# Patient Record
Sex: Female | Born: 1955
Health system: Southern US, Community
[De-identification: ages and names within clinical notes are randomized; demographics above are authoritative.]

## PROBLEM LIST (undated history)

## (undated) DIAGNOSIS — K5790 Diverticulosis of intestine, part unspecified, without perforation or abscess without bleeding: Secondary | ICD-10-CM

## (undated) DIAGNOSIS — E039 Hypothyroidism, unspecified: Secondary | ICD-10-CM

## (undated) DIAGNOSIS — D649 Anemia, unspecified: Secondary | ICD-10-CM

## (undated) DIAGNOSIS — I5022 Chronic systolic (congestive) heart failure: Secondary | ICD-10-CM

## (undated) DIAGNOSIS — K52839 Microscopic colitis, unspecified: Secondary | ICD-10-CM

## (undated) DIAGNOSIS — E785 Hyperlipidemia, unspecified: Secondary | ICD-10-CM

## (undated) DIAGNOSIS — I471 Supraventricular tachycardia, unspecified: Secondary | ICD-10-CM

## (undated) DIAGNOSIS — N289 Disorder of kidney and ureter, unspecified: Secondary | ICD-10-CM

## (undated) DIAGNOSIS — I739 Peripheral vascular disease, unspecified: Secondary | ICD-10-CM

## (undated) DIAGNOSIS — K222 Esophageal obstruction: Secondary | ICD-10-CM

## (undated) DIAGNOSIS — I8390 Asymptomatic varicose veins of unspecified lower extremity: Secondary | ICD-10-CM

## (undated) DIAGNOSIS — I4819 Other persistent atrial fibrillation: Secondary | ICD-10-CM

## (undated) DIAGNOSIS — E119 Type 2 diabetes mellitus without complications: Secondary | ICD-10-CM

## (undated) DIAGNOSIS — T462X1A Poisoning by other antidysrhythmic drugs, accidental (unintentional), initial encounter: Secondary | ICD-10-CM

## (undated) DIAGNOSIS — W010XXA Fall on same level from slipping, tripping and stumbling without subsequent striking against object, initial encounter: Secondary | ICD-10-CM

## (undated) DIAGNOSIS — Z950 Presence of cardiac pacemaker: Secondary | ICD-10-CM

## (undated) DIAGNOSIS — Q249 Congenital malformation of heart, unspecified: Secondary | ICD-10-CM

## (undated) DIAGNOSIS — I2510 Atherosclerotic heart disease of native coronary artery without angina pectoris: Secondary | ICD-10-CM

## (undated) DIAGNOSIS — I428 Other cardiomyopathies: Secondary | ICD-10-CM

## (undated) DIAGNOSIS — I10 Essential (primary) hypertension: Secondary | ICD-10-CM

## (undated) DIAGNOSIS — D509 Iron deficiency anemia, unspecified: Secondary | ICD-10-CM

## (undated) DIAGNOSIS — K219 Gastro-esophageal reflux disease without esophagitis: Secondary | ICD-10-CM

## (undated) DIAGNOSIS — M419 Scoliosis, unspecified: Secondary | ICD-10-CM

## (undated) HISTORY — DX: Fall on same level from slipping, tripping and stumbling without subsequent striking against object, initial encounter: W01.0XXA

## (undated) HISTORY — DX: Peripheral vascular disease, unspecified: I73.9

## (undated) HISTORY — DX: Iron deficiency anemia, unspecified: D50.9

## (undated) HISTORY — DX: Type 2 diabetes mellitus without complications: E11.9

## (undated) HISTORY — DX: Hyperlipidemia, unspecified: E78.5

## (undated) HISTORY — DX: Microscopic colitis, unspecified: K52.839

## (undated) HISTORY — DX: Hypothyroidism, unspecified: E03.9

## (undated) HISTORY — PX: TONSILLECTOMY: SUR1361

## (undated) HISTORY — DX: Gastro-esophageal reflux disease without esophagitis: K21.9

## (undated) HISTORY — DX: Essential (primary) hypertension: I10

## (undated) HISTORY — DX: Presence of cardiac pacemaker: Z95.0

## (undated) HISTORY — PX: ASD REPAIR: SHX258

## (undated) HISTORY — DX: Disorder of kidney and ureter, unspecified: N28.9

## (undated) HISTORY — DX: Congenital malformation of heart, unspecified: Q24.9

## (undated) HISTORY — DX: Asymptomatic varicose veins of unspecified lower extremity: I83.90

## (undated) HISTORY — DX: Supraventricular tachycardia: I47.1

## (undated) HISTORY — DX: Diverticulosis of intestine, part unspecified, without perforation or abscess without bleeding: K57.90

## (undated) HISTORY — DX: Supraventricular tachycardia, unspecified: I47.10

## (undated) HISTORY — DX: Esophageal obstruction: K22.2

## (undated) HISTORY — DX: Scoliosis, unspecified: M41.9

---

## 2004-06-29 ENCOUNTER — Ambulatory Visit (HOSPITAL_COMMUNITY): Admission: RE | Admit: 2004-06-29 | Discharge: 2004-06-29 | Payer: Self-pay | Admitting: Family Medicine

## 2004-09-24 DIAGNOSIS — I739 Peripheral vascular disease, unspecified: Secondary | ICD-10-CM

## 2004-09-24 HISTORY — DX: Peripheral vascular disease, unspecified: I73.9

## 2005-04-04 ENCOUNTER — Ambulatory Visit (HOSPITAL_COMMUNITY): Admission: RE | Admit: 2005-04-04 | Discharge: 2005-04-04 | Payer: Self-pay | Admitting: *Deleted

## 2005-06-26 HISTORY — PX: OTHER SURGICAL HISTORY: SHX169

## 2005-08-28 ENCOUNTER — Ambulatory Visit (HOSPITAL_COMMUNITY): Admission: RE | Admit: 2005-08-28 | Discharge: 2005-08-28 | Payer: Self-pay | Admitting: Cardiovascular Disease

## 2005-08-31 ENCOUNTER — Ambulatory Visit (HOSPITAL_COMMUNITY): Admission: RE | Admit: 2005-08-31 | Discharge: 2005-08-31 | Payer: Self-pay | Admitting: Cardiovascular Disease

## 2005-10-16 ENCOUNTER — Inpatient Hospital Stay (HOSPITAL_COMMUNITY): Admission: RE | Admit: 2005-10-16 | Discharge: 2005-10-18 | Payer: Self-pay | Admitting: *Deleted

## 2006-09-24 ENCOUNTER — Ambulatory Visit (HOSPITAL_COMMUNITY): Admission: RE | Admit: 2006-09-24 | Discharge: 2006-09-24 | Payer: Self-pay | Admitting: Internal Medicine

## 2006-09-25 HISTORY — PX: COLONOSCOPY: SHX174

## 2006-10-01 ENCOUNTER — Ambulatory Visit: Payer: Self-pay | Admitting: Urgent Care

## 2006-10-15 ENCOUNTER — Ambulatory Visit (HOSPITAL_COMMUNITY): Admission: RE | Admit: 2006-10-15 | Discharge: 2006-10-15 | Payer: Self-pay | Admitting: Internal Medicine

## 2006-10-15 ENCOUNTER — Ambulatory Visit: Payer: Self-pay | Admitting: Internal Medicine

## 2006-12-06 ENCOUNTER — Ambulatory Visit: Payer: Self-pay | Admitting: Gastroenterology

## 2006-12-13 ENCOUNTER — Ambulatory Visit: Payer: Self-pay | Admitting: Gastroenterology

## 2007-05-20 ENCOUNTER — Ambulatory Visit: Payer: Self-pay | Admitting: Cardiology

## 2007-05-20 ENCOUNTER — Ambulatory Visit (HOSPITAL_COMMUNITY): Admission: RE | Admit: 2007-05-20 | Discharge: 2007-05-20 | Payer: Self-pay | Admitting: Cardiology

## 2007-06-12 ENCOUNTER — Ambulatory Visit: Payer: Self-pay | Admitting: Cardiology

## 2007-09-24 ENCOUNTER — Ambulatory Visit (HOSPITAL_COMMUNITY): Admission: RE | Admit: 2007-09-24 | Discharge: 2007-09-24 | Payer: Self-pay | Admitting: Internal Medicine

## 2007-10-16 ENCOUNTER — Ambulatory Visit (HOSPITAL_COMMUNITY): Admission: RE | Admit: 2007-10-16 | Discharge: 2007-10-16 | Payer: Self-pay | Admitting: Internal Medicine

## 2007-10-24 ENCOUNTER — Ambulatory Visit: Payer: Self-pay | Admitting: *Deleted

## 2007-11-05 ENCOUNTER — Ambulatory Visit (HOSPITAL_COMMUNITY): Admission: RE | Admit: 2007-11-05 | Discharge: 2007-11-05 | Payer: Self-pay | Admitting: Internal Medicine

## 2007-11-06 ENCOUNTER — Ambulatory Visit: Payer: Self-pay | Admitting: Cardiology

## 2007-11-07 ENCOUNTER — Encounter: Payer: Self-pay | Admitting: Cardiology

## 2007-11-07 ENCOUNTER — Ambulatory Visit: Payer: Self-pay | Admitting: Cardiovascular Disease

## 2007-11-07 ENCOUNTER — Ambulatory Visit (HOSPITAL_COMMUNITY): Admission: RE | Admit: 2007-11-07 | Discharge: 2007-11-07 | Payer: Self-pay | Admitting: Cardiology

## 2007-11-16 ENCOUNTER — Emergency Department (HOSPITAL_COMMUNITY): Admission: EM | Admit: 2007-11-16 | Discharge: 2007-11-16 | Payer: Self-pay | Admitting: Emergency Medicine

## 2007-11-25 ENCOUNTER — Ambulatory Visit: Payer: Self-pay | Admitting: Cardiology

## 2008-02-07 ENCOUNTER — Ambulatory Visit (HOSPITAL_COMMUNITY): Admission: RE | Admit: 2008-02-07 | Discharge: 2008-02-07 | Payer: Self-pay | Admitting: Internal Medicine

## 2008-03-03 ENCOUNTER — Encounter (HOSPITAL_COMMUNITY): Admission: RE | Admit: 2008-03-03 | Discharge: 2008-03-23 | Payer: Self-pay | Admitting: Internal Medicine

## 2008-04-29 ENCOUNTER — Ambulatory Visit (HOSPITAL_COMMUNITY): Admission: RE | Admit: 2008-04-29 | Discharge: 2008-04-29 | Payer: Self-pay | Admitting: Internal Medicine

## 2008-05-26 ENCOUNTER — Ambulatory Visit (HOSPITAL_COMMUNITY): Admission: RE | Admit: 2008-05-26 | Discharge: 2008-05-26 | Payer: Self-pay | Admitting: Internal Medicine

## 2008-05-26 HISTORY — PX: OTHER SURGICAL HISTORY: SHX169

## 2008-05-26 HISTORY — PX: ESOPHAGOGASTRODUODENOSCOPY: SHX1529

## 2008-06-02 ENCOUNTER — Ambulatory Visit (HOSPITAL_COMMUNITY): Admission: RE | Admit: 2008-06-02 | Discharge: 2008-06-02 | Payer: Self-pay | Admitting: Internal Medicine

## 2008-06-08 ENCOUNTER — Inpatient Hospital Stay (HOSPITAL_COMMUNITY): Admission: EM | Admit: 2008-06-08 | Discharge: 2008-06-11 | Payer: Self-pay | Admitting: Emergency Medicine

## 2008-06-08 ENCOUNTER — Ambulatory Visit: Payer: Self-pay | Admitting: Cardiology

## 2008-06-08 ENCOUNTER — Ambulatory Visit: Payer: Self-pay | Admitting: Gastroenterology

## 2008-06-09 ENCOUNTER — Encounter: Payer: Self-pay | Admitting: Cardiology

## 2008-06-09 ENCOUNTER — Ambulatory Visit: Payer: Self-pay | Admitting: Internal Medicine

## 2008-06-10 ENCOUNTER — Ambulatory Visit: Payer: Self-pay | Admitting: Gastroenterology

## 2008-06-11 ENCOUNTER — Ambulatory Visit: Payer: Self-pay | Admitting: Internal Medicine

## 2008-06-24 ENCOUNTER — Ambulatory Visit (HOSPITAL_COMMUNITY): Payer: Self-pay | Admitting: Oncology

## 2008-06-24 ENCOUNTER — Encounter (HOSPITAL_COMMUNITY): Admission: RE | Admit: 2008-06-24 | Discharge: 2008-07-24 | Payer: Self-pay | Admitting: Oncology

## 2008-06-25 ENCOUNTER — Encounter: Payer: Self-pay | Admitting: Physician Assistant

## 2008-06-25 ENCOUNTER — Ambulatory Visit: Payer: Self-pay | Admitting: Cardiology

## 2008-06-26 HISTORY — PX: HEMORRHOID SURGERY: SHX153

## 2008-06-30 ENCOUNTER — Ambulatory Visit (HOSPITAL_COMMUNITY): Admission: RE | Admit: 2008-06-30 | Discharge: 2008-07-01 | Payer: Self-pay | Admitting: Cardiology

## 2008-07-09 ENCOUNTER — Ambulatory Visit: Payer: Self-pay | Admitting: Cardiology

## 2008-07-10 ENCOUNTER — Encounter: Payer: Self-pay | Admitting: Gastroenterology

## 2008-07-10 LAB — CONVERTED CEMR LAB
Basophils Absolute: 0.1 10*3/uL (ref 0.0–0.1)
Basophils Relative: 1 % (ref 0–1)
Eosinophils Absolute: 0.4 10*3/uL (ref 0.0–0.7)
Eosinophils Relative: 4 % (ref 0–5)
HCT: 38.7 % (ref 36.0–46.0)
Hemoglobin: 12.3 g/dL (ref 12.0–15.0)
Lymphocytes Relative: 42 % (ref 12–46)
Lymphs Abs: 3.9 10*3/uL (ref 0.7–4.0)
MCHC: 31.8 g/dL (ref 30.0–36.0)
MCV: 75.9 fL — ABNORMAL LOW (ref 78.0–100.0)
Monocytes Absolute: 1.1 10*3/uL — ABNORMAL HIGH (ref 0.1–1.0)
Monocytes Relative: 12 % (ref 3–12)
Neutro Abs: 3.8 10*3/uL (ref 1.7–7.7)
Neutrophils Relative %: 41 % — ABNORMAL LOW (ref 43–77)
Platelets: 289 10*3/uL (ref 150–400)
RBC: 5.1 M/uL (ref 3.87–5.11)
RDW: 24.5 % — ABNORMAL HIGH (ref 11.5–15.5)
WBC: 9.3 10*3/uL (ref 4.0–10.5)

## 2008-07-20 ENCOUNTER — Ambulatory Visit: Payer: Self-pay | Admitting: Cardiology

## 2008-08-28 ENCOUNTER — Ambulatory Visit: Payer: Self-pay | Admitting: *Deleted

## 2008-10-19 ENCOUNTER — Ambulatory Visit (HOSPITAL_COMMUNITY): Admission: RE | Admit: 2008-10-19 | Discharge: 2008-10-19 | Payer: Self-pay | Admitting: Internal Medicine

## 2008-10-22 ENCOUNTER — Ambulatory Visit: Payer: Self-pay | Admitting: *Deleted

## 2009-01-15 ENCOUNTER — Ambulatory Visit (HOSPITAL_COMMUNITY): Admission: RE | Admit: 2009-01-15 | Discharge: 2009-01-15 | Payer: Self-pay | Admitting: General Surgery

## 2009-01-15 ENCOUNTER — Encounter (INDEPENDENT_AMBULATORY_CARE_PROVIDER_SITE_OTHER): Payer: Self-pay | Admitting: General Surgery

## 2009-10-21 ENCOUNTER — Ambulatory Visit (HOSPITAL_COMMUNITY): Admission: RE | Admit: 2009-10-21 | Discharge: 2009-10-21 | Payer: Self-pay | Admitting: Internal Medicine

## 2010-01-21 ENCOUNTER — Ambulatory Visit (HOSPITAL_COMMUNITY): Admission: RE | Admit: 2010-01-21 | Discharge: 2010-01-21 | Payer: Self-pay | Admitting: Internal Medicine

## 2010-02-11 DIAGNOSIS — E0590 Thyrotoxicosis, unspecified without thyrotoxic crisis or storm: Secondary | ICD-10-CM | POA: Insufficient documentation

## 2010-02-18 ENCOUNTER — Ambulatory Visit: Payer: Self-pay | Admitting: Internal Medicine

## 2010-02-18 DIAGNOSIS — R197 Diarrhea, unspecified: Secondary | ICD-10-CM

## 2010-02-18 DIAGNOSIS — Z862 Personal history of diseases of the blood and blood-forming organs and certain disorders involving the immune mechanism: Secondary | ICD-10-CM

## 2010-02-18 DIAGNOSIS — K219 Gastro-esophageal reflux disease without esophagitis: Secondary | ICD-10-CM

## 2010-02-18 DIAGNOSIS — R1310 Dysphagia, unspecified: Secondary | ICD-10-CM | POA: Insufficient documentation

## 2010-02-18 DIAGNOSIS — R1031 Right lower quadrant pain: Secondary | ICD-10-CM

## 2010-02-18 DIAGNOSIS — R630 Anorexia: Secondary | ICD-10-CM

## 2010-02-21 ENCOUNTER — Encounter: Payer: Self-pay | Admitting: Internal Medicine

## 2010-02-24 DIAGNOSIS — K52839 Microscopic colitis, unspecified: Secondary | ICD-10-CM

## 2010-02-24 HISTORY — DX: Microscopic colitis, unspecified: K52.839

## 2010-02-24 LAB — CONVERTED CEMR LAB
IgA: 180 mg/dL (ref 68–378)
Tissue Transglutaminase Ab, IgA: 10.4 units (ref <20)

## 2010-03-04 ENCOUNTER — Ambulatory Visit: Payer: Self-pay | Admitting: Internal Medicine

## 2010-03-04 ENCOUNTER — Ambulatory Visit (HOSPITAL_COMMUNITY): Admission: RE | Admit: 2010-03-04 | Discharge: 2010-03-04 | Payer: Self-pay | Admitting: Internal Medicine

## 2010-03-04 HISTORY — PX: ESOPHAGOGASTRODUODENOSCOPY: SHX1529

## 2010-03-04 HISTORY — PX: COLONOSCOPY: SHX174

## 2010-04-08 ENCOUNTER — Ambulatory Visit: Payer: Self-pay | Admitting: Internal Medicine

## 2010-04-08 DIAGNOSIS — M359 Systemic involvement of connective tissue, unspecified: Secondary | ICD-10-CM | POA: Insufficient documentation

## 2010-05-16 ENCOUNTER — Telehealth (INDEPENDENT_AMBULATORY_CARE_PROVIDER_SITE_OTHER): Payer: Self-pay

## 2010-07-13 ENCOUNTER — Encounter (INDEPENDENT_AMBULATORY_CARE_PROVIDER_SITE_OTHER): Payer: Self-pay | Admitting: *Deleted

## 2010-07-26 NOTE — Letter (Signed)
Summary: TCS ORDER  TCS ORDER   Imported By: Sofie Rower 02/21/2010 10:20:06  _____________________________________________________________________  External Attachment:    Type:   Image     Comment:   External Document

## 2010-07-26 NOTE — Assessment & Plan Note (Signed)
Summary: UPPER QUADRANT PAIN/SS   Visit Type:  Consult Referring Provider:  Wende Neighbors Primary Care Provider:  Wende Neighbors  Chief Complaint:  abd pain.  History of Present Illness: [name] is a pleasant 55 y/o WF, patient of Dr. Wende Neighbors, who presents for further evaluation of abdominal pain. She has h/o abd pain for couple of years.  Used to happen once per month chronically and just when standing up and walking. Last 1-2 months more constant. Sometimes worse with meals. Some nausea. One day with severe pain, did throw up. Appetite poor. Still with heartburn, not controlled on prilosec. Takes TUMS. Last couple of weeks, symptoms worse. Difficulty taking pills, sometimes hard to get down. No brbpr, melena. BM with intermittent diarrhea. Sometimes Imodium. Diarrhea more the last month. PP urgency.   Saw Dr. Collene Mares is [place] since 2009. No procedure done. Ended up with hemorrhoidectomy last year. Laying off of indomethacin as much as possible because of h/o SB NSAID induced enteropathy.   CT A/P, 01/21/10-->both kidneys malrotated, ascending colonic diverticulosis, prominent diffuse submucosal fatty infiltration of bowel (stable since 2009)  Labs 01/10/10-->Na 138, K 4.2, BUN 34, Cre 1.61, Tbili 0.4, AP 47, AST 21, ALT 23, alb 4.6, WBC 10,400, H/H 15.1/46.2, Plt 273,000, TSH 0.363, Free T4 2.26  Current Medications (verified): 1)  Levothyroxine Sodium 88 Mcg Tabs (Levothyroxine Sodium) .... Once Daily 2)  Atenolol 25 Mg Tabs (Atenolol) .... 2 Once Daily 3)  Furosemide 40 Mg Tabs (Furosemide) 4)  Simvastatin 40 Mg Tabs (Simvastatin) 5)  K-Dur 10 Meq Once Daily 6)  Dilitazem Extended Release 180 Once Daily 7)  Ferro-Bob 325 (65 Fe) Mg Tabs (Ferrous Sulfate) 8)  Allopurinol 100 Mg Tabs (Allopurinol) .... 2 Once Daily 9)  Zolpidem Tartrate 10 Mg Tabs (Zolpidem Tartrate) .... Qhs 10)  Fenofibrate 160 Mg Tabs (Fenofibrate) .... Once Daily 11)  Indomethacin 50 Mg Caps (Indomethacin) .... As  Needed 12)  Omeprazole 20 Mg Cpdr (Omeprazole) .... 2 Once Daily 13)  Calcium 600mg  .... 2 Once Daily 14)  Fish Oil 1000 Mg Caps (Omega-3 Fatty Acids) .... 2 Once Daily 15)  Vitamin E 400iu .... 2 Once Daily 16)  Tylenol .... As Needed  Allergies (verified): 1)  ! Oxycodone Hcl 2)  ! Vicodin 3)  ! Penicillin  Past History:  Past Medical History: Congenital Heart Disease PSVT (long RP tachycardia)      A.  rule out ectopic atrial tachycardia vs. 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 Rehman-->pancolonic diverticulosis, ext hemorrhoids EGD, 12/09, Dr. Raliegh Scarlet reflux esophagitis, noncritical Schatzi's ring, not manipulated SB capusle, 12/09-->abnormal appearing SB mucosa with edematous appearance, mid-to-distal SB. Distal SB with large erosions and tiny ulceration. ?NSAID-induced enteropathy.  Remote ileitis, 1994 Chronic bronchitis  Past Surgical History: s/p ASD repair (age 10) at [facility] in [place], [place] s/p Right common femoral endarterectomy with Dacron patch angioplasty 10/16/2005 s/p Left parathyroidectomy 11/2005 s/p Right parathyroid adenoma excision 03/2006 s/p Left Hemithyroidectomy 11/2005 Tonsillectomy Hemorrhoidectomy,2010  Family History: Mother: s/p CVA in [id number], died from complications with COPD Father:  died from Lung CA No FH of CRC, liver, chronic GI illnesses  Social History: Tobacco Use - No.  Alcohol Use - no Full Time Single  No children. Works at [employer].  Review of Systems General:  Complains of anorexia; denies fever, chills, sweats,  weakness, and weight loss. Eyes:  Denies vision loss. ENT:  Complains of difficulty swallowing; denies nasal congestion, sore throat, and hoarseness. CV:  Denies chest pains, angina, palpitations, dyspnea on exertion, and peripheral edema. Resp:  Denies dyspnea at rest, dyspnea with exercise, cough, sputum, and wheezing. GI:  See HPI. GU:  Denies urinary burning and blood in urine. MS:  Complains of joint pain / LOM. Derm:  Denies rash and itching. Neuro:  Denies weakness, frequent headaches, memory loss, and confusion. Psych:  Denies depression and anxiety. Endo:  Denies unusual weight change. Heme:  Denies bruising and bleeding. Allergy:  Denies hives and rash.  Vital Signs:  Patient profile:   55 year old female Height:      58.5 inches Weight:      163 pounds BMI:     33.61 Temp:     97.5 degrees F oral Pulse rate:   68 / minute BP sitting:   128 / 90  (left arm) Cuff size:   regular  Vitals Entered By: Burnadette Peter LPN (August 26, 624THL 10:33 AM)  Physical Exam  General:  Well developed, well nourished, no acute distress. Head:  Normocephalic and atraumatic. Eyes:  sclera nonicteric Mouth:  Oropharyngeal mucosa moist, pink.  No lesions, erythema or exudate.    Neck:  Supple; no masses or thyromegaly. Lungs:  Clear throughout to auscultation. Heart:  Regular rate and rhythm; no murmurs, rubs,  or bruits. Abdomen:  Bowel sounds normal.  Abdomen is soft, nontender, nondistended.  No rebound or guarding.  No hepatosplenomegaly, masses or hernias.  No abdominal bruits.  Rectal:  deferred until time of colonoscopy.   Extremities:  No clubbing, cyanosis, edema or deformities noted. Neurologic:  Alert and  oriented x4;  grossly normal neurologically. Skin:  Intact without significant lesions or rashes. Cervical Nodes:  No significant cervical adenopathy. Psych:  Alert and cooperative. Normal mood and affect.  Impression & Recommendations:  Problem # 1:  RLQ PAIN  (ICD-789.03)  Chronic RLQ pain worse in last couple of months. Also with increased diarrhea. Anorexia. H/O SB erosions/ulcers previously thought to be secondary to NSAIDS. Now on limited indomethacin. H/H okay but on chronic iron therapy. ?underlying IBD. Discussed with Dr. Gala Romney. Recommend TCS/TI. Risks, alternatives, and benefits including but not limited to the risk of reaction to medication, bleeding, infection, and perforation were addressed.  Patient voiced understanding and provided verbal consent.   Will check for celiac given h/o chronic IDA, diarrhea.  Orders: Consultation Level IV [id number])  Problem # 2:  GERD (ICD-530.81)  Refractory GERD, pill dysphagia, h/o Schatzki ring not manipulated in 2009. Anorexia. Discussed with Dr. Gala Romney. Plan for EGD. EGD to be performed in near future.  Risks, alternatives, benefits including but not limited to risk of reaction to medications, bleeding, infection, and perforation addressed.  Patient voiced understanding and verbal consent obtained.   Orders: Consultation Level IV [id number])  Other Orders: T-Tissue Transglutamase Ab IgA [id number]) T-igA ([id number]) I would like to thank Dr. Wende Neighbors for allowing [place] to take part in the care of this nice patient.  Appended Document: UPPER QUADRANT PAIN/SS Please schedule patient for TCS/TI/EGD with RMR. Dx IDA, anorexia, GERD, dysphagia, RLQ pain, diarrhea.  Please let pt know above plan and she also needs celiac labs done, see order.   Appended Document: UPPER QUADRANT PAIN/SS LMOM for pt to call. Lab  order faxed to [facility].  Appended Document: UPPER QUADRANT PAIN/SS Pt was informed. Aware she will be scheduled for procedures.  Appended Document: UPPER QUADRANT PAIN/SS Pt scheduled for TCS/TI/EGD on 03/04/2010 @ 12:30. LMOM for Kim.

## 2010-07-26 NOTE — Assessment & Plan Note (Signed)
Summary: OV IN ONE MONTH W/EXTENDER/MICROSCOPIC COLITIS/SS   Chief Complaint:  follow up- doing ok most of the time.  History of Present Illness: [name] is here for f/u. She has h/o chronic RLQ pain, diarrhea, dysphagia, anorexia, GERD. Recently underwent EGD/ED/TCS. She had noncritical Schatzki's ring which was dilated. Small hh. Duodenal bx negative for celiac.  Anal canal hemorrhoids, otherwise normal rectum, pancolonic diverticula more right side than left.  Remainder of colonic mucosa appeared normal as did terminal ileal mucosa status post biopsy of the sigmoid and stool collection.  Normal rectum aside from anal canal hemorrhoids. She had bx c/w lymphocytic colitis. Stool culture, CDiff, O+P were negative.   She took one month of Entocort, completed yesterday. Having diarrhea once day per week. BM everyday. Last week some brbpr X 1. Abd pain resolved. Appetite better. Swallowing better. No problems with acid reflux lately.    CT A/P in 7/11--> no evidence of appendicitis or diverticulitis.  Current Medications (verified): 1)  Levothyroxine Sodium 88 Mcg Tabs (Levothyroxine Sodium) .... Once Daily 2)  Atenolol 25 Mg Tabs (Atenolol) .... 2 Once Daily 3)  Furosemide 40 Mg Tabs (Furosemide) 4)  Simvastatin 40 Mg Tabs (Simvastatin) 5)  K-Dur 10 Meq Once Daily 6)  Dilitazem Extended Release 180 Once Daily 7)  Ferro-Bob 325 (65 Fe) Mg Tabs (Ferrous Sulfate) 8)  Allopurinol 100 Mg Tabs (Allopurinol) .... 2 Once Daily 9)  Zolpidem Tartrate 10 Mg Tabs (Zolpidem Tartrate) .... Qhs 10)  Fenofibrate 160 Mg Tabs (Fenofibrate) .... Once Daily 11)  Indomethacin 50 Mg Caps (Indomethacin) .... As Needed 12)  Omeprazole 20 Mg Cpdr (Omeprazole) .... 2 Once Daily 13)  Calcium 600mg  .... 2 Once Daily 14)  Fish Oil 1000 Mg Caps (Omega-3 Fatty Acids) .... 2 Once Daily 15)  Vitamin E 400iu .... 2 Once Daily 16)  Tylenol .... As Needed  Allergies (verified): 1)  ! Oxycodone Hcl 2)  ! Vicodin 3)   ! Penicillin  Review of Systems      See HPI  Vital Signs:  Patient profile:   55 year old female Height:      58.5 inches Weight:      165 pounds BMI:     34.02 Temp:     97.4 degrees F oral Pulse rate:   68 / minute BP sitting:   112 / 78  (left arm) Cuff size:   regular  Vitals Entered By: Burnadette Peter LPN (October 14, 624THL 10:33 AM)  Physical Exam  General:  Well developed, well nourished, no acute distress. Head:  Normocephalic and atraumatic. Eyes:  sclera nonicteric Mouth:  op moist Abdomen:  Bowel sounds normal.  Abdomen is soft, nontender, nondistended.  No rebound or guarding.  No hepatosplenomegaly, masses or hernias.  No abdominal bruits.  Extremities:  No clubbing, cyanosis, edema or deformities noted. Neurologic:  Alert and  oriented x4;  grossly normal neurologically. Skin:  Intact without significant lesions or rashes. Psych:  Alert and cooperative. Normal mood and affect.  Impression & Recommendations:  Problem # 1:  COLLAGENOUS COLITIS (ICD-710.9)  Lymphocytic colitis doing better on Entocort. Will continue Entocort 6mg  daily for one month and then 3mg  daily for one month and stop. OV in 3 months.   Orders: Est. Patient Level II [id number])  Problem # 2:  RLQ PAIN (ICD-789.03)  Resolved.  Orders: Est. Patient Level II [id number])  Problem # 3:  GERD (ICD-530.81)  Controlled.  Orders: Est. Patient Level II [id number])  Problem # 4:  DYSPHAGIA UNSPECIFIED (ICD-787.20)  Resolved s/p dilation.  Orders: Est. Patient Level II [id number]) Prescriptions: BUDESONIDE 3 MG XR24H-CAP (BUDESONIDE) 2 by mouth daily for one month, then 1 by mouth daily for one month, then stop.  #60 x 1   Entered and Authorized by:   Laureen Ochs. Bernarda Caffey   Signed by:   Laureen Ochs Daylene Vandenbosch PA-C on 04/08/2010   Method used:   Electronically to        [facility]. [phone]* (retail)       [address]       [place], [place]  [address]       Ph: [phone] or  [phone]       Fax: [phone]   RxID:   [id number]   Appended Document: OV IN ONE MONTH W/EXTENDER/MICROSCOPIC COLITIS/SS F/U 3 MON OV IS IN THE COMPUTER  Appended Document: entocort Unfortunately, not really any good substitutes.    Appended Document: entocort tried to call pt- LMOM  Appended Document: entocort pt aware  Appended Document: entocort can try peptobismul tablets - 3 chewed and swallowed three times a day x 8 weeks ; f/u w extender in 12 weeks  Appended Document: entocort tried to call pt- LMOM  Appended Document: entocort Pt informed of the above.

## 2010-07-26 NOTE — Letter (Signed)
Summary: REFERRAL FROM DR Wende Neighbors  REFERRAL FROM DR [facility]   Imported By: Hoy Morn 02/18/2010 16:21:34  _____________________________________________________________________  External Attachment:    Type:   Image     Comment:   External Document

## 2010-07-28 NOTE — Progress Notes (Signed)
Summary: entocort  Phone Note Call from Patient Call back at Home Phone [phone]   Caller: Patient Summary of Call: pt came by office- She is in the doughnut hole with her insurance and cant afford entocort. Informed pt that we dont get samples anymore and they dont have pt assistance for that medication. Told her I would check and see if there was anything else we can do. please advise.  Pt uses [facility] Initial call taken by: Burnadette Peter LPN,  November 21, 624THL 3:36 PM     Appended Document: entocort Unfortunately, not really any good substitutes.    Appended Document: entocort tried to call pt- LMOM  Appended Document: entocort pt aware  Appended Document: entocort can try peptobismul tablets - 3 chewed and swallowed three times a day x 8 weeks ; f/u w extender in 12 weeks  Appended Document: entocort tried to call pt- LMOM  Appended Document: entocort Pt informed of the above.   Appended Document: entocort reminder in computer

## 2010-07-28 NOTE — Letter (Signed)
Summary: Recall Office Visit  [facility]  [address]   [place], [place] [address]   Phone: [phone]  Fax: [phone]      July 13, 2010   [name] [address] [place], [place]  [address] [birth year]   Dear Ms. Krull,   According to our records, it is time for you to schedule a follow-up office visit with [place].   At your convenience, please call [phone] to schedule an office visit. If you have any questions, concerns, or feel that this letter is in error, we would appreciate your call.   Sincerely,    [facility] Ph: [phone]   Fax: [phone]

## 2010-08-02 ENCOUNTER — Ambulatory Visit (INDEPENDENT_AMBULATORY_CARE_PROVIDER_SITE_OTHER): Payer: Medicare Other | Admitting: Urgent Care

## 2010-08-02 ENCOUNTER — Encounter: Payer: Self-pay | Admitting: Urgent Care

## 2010-08-02 DIAGNOSIS — K219 Gastro-esophageal reflux disease without esophagitis: Secondary | ICD-10-CM

## 2010-08-02 DIAGNOSIS — R197 Diarrhea, unspecified: Secondary | ICD-10-CM

## 2010-08-02 DIAGNOSIS — M359 Systemic involvement of connective tissue, unspecified: Secondary | ICD-10-CM

## 2010-08-02 DIAGNOSIS — R1310 Dysphagia, unspecified: Secondary | ICD-10-CM

## 2010-08-11 NOTE — Assessment & Plan Note (Signed)
Summary: F/U OV IN 3 MON GERD,DYSPHAGIA   Vital Signs:  Patient profile:   55 year old female Height:      58.5 inches Weight:      160 pounds BMI:     32.99 Temp:     98.4 degrees F Pulse rate:   64 / minute BP supine:   128 / 76  Visit Type:  Follow-up Visit Primary Care Provider:  Dr. Merlyn Albert  Chief Complaint:  FU GERD/IBS.  History of Present Illness: [name] is here for f/u. h/o chronic RLQ pain, diarrhea & microscopic colitis, dysphagia, anorexia, GERD,noncritical Schatzki's ring, Small hh, negative duodenal bx, hemorrhoids & diverticulosis.  Usu AM 2-3 stools before work.  Occ incontinence of stool.  taking pepto three times a day.  Prn imodium once per week.  Denies abd pain.  Denies vomiting.  AM nasuea.  Appetite ok.  Problems w/ chocolate.  Denies  heartburn & indigestion as long as she takes two times a day omeprazole.  Low fiber diet.  Tried benefiber previously.  One month of Entocort previous w/ great results.  Denies dysphagia or odynophagia now.    CT A/P in 7/11--> no evidence of appendicitis or diverticulitis.  Current Medications (verified): 1)  Levothyroxine Sodium 88 Mcg Tabs (Levothyroxine Sodium) .... Once Daily 2)  Atenolol 25 Mg Tabs (Atenolol) .... 2 Once Daily 3)  Furosemide 40 Mg Tabs (Furosemide) 4)  Simvastatin 20 Mg Tabs (Simvastatin) 5)  Klor-Con M10 10 Meq Cr-Tabs (Potassium Chloride Crys Cr) .Marland Kitchen.. 56meq Daily 6)  Dilitazem Extended Release 180 Once Daily 7)  Ferro-Bob 325 (65 Fe) Mg Tabs (Ferrous Sulfate) 8)  Allopurinol 100 Mg Tabs (Allopurinol) .... 2 Once Daily 9)  Zolpidem Tartrate 10 Mg Tabs (Zolpidem Tartrate) .... Qhs 10)  Fenofibrate 160 Mg Tabs (Fenofibrate) .... Once Daily 11)  Indomethacin 50 Mg Caps (Indomethacin) .... As Needed 12)  Omeprazole 20 Mg Cpdr (Omeprazole) .... 2 Once Daily 13)  Calcium 600mg  .... 2 Once Daily 14)  Fish Oil 1000 Mg Caps (Omega-3 Fatty Acids) .... 2 Once Daily 15)  Vitamin E 400iu .... 2 Once  Daily 16)  Tylenol .... As Needed  Allergies (verified): 1)  ! Oxycodone Hcl 2)  ! Vicodin 3)  ! Penicillin  Past History:  Past Medical History: Congenital Heart Disease PSVT (long RP tachycardia)      A.  rule out ectopic atrial tachycardia vs. 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 Rehman-->pancolonic diverticulosis, ext hemorrhoids Last EGD 9/11->Schatzki's ring, sm HH EGD, 12/09, Dr. Raliegh Scarlet reflux esophagitis, noncritical Schatzi's ring, not manipulated SB capusle, 12/09-->abnormal appearing SB mucosa with edematous appearance, mid-to-distal SB. Distal SB with large erosions and tiny ulceration. ?NSAID-induced enteropathy.  Remote ileitis, 1994 Chronic bronchitis  Past Surgical History: Reviewed history from 02/18/2010 and no changes required. s/p ASD repair (age 15) at [facility] in [place], [place] s/p Right common femoral endarterectomy with Dacron patch angioplasty 10/16/2005 s/p Left parathyroidectomy 11/2005 s/p Right parathyroid adenoma excision 03/2006 s/p Left Hemithyroidectomy 11/2005 Tonsillectomy Hemorrhoidectomy,2010  Review of Systems      See HPI General:  Denies fever, chills, sweats, anorexia, fatigue, weakness, malaise, weight loss, and sleep disorder. CV:  Denies chest pains, angina, palpitations, syncope, dyspnea on exertion, orthopnea, PND, peripheral edema, and claudication. Resp:  Denies dyspnea at rest, dyspnea with exercise, cough, sputum, wheezing, coughing up blood, and pleurisy. GI:  See  HPI; Complains of fecal incontinence; denies difficulty swallowing, pain on swallowing, vomiting blood, jaundice, bloody BM's, and black BMs. GU:  Denies urinary burning, blood in urine, nocturnal urination, urinary frequency, urinary incontinence, and abnormal vaginal bleeding. MS:  Denies joint pain / LOM, joint swelling, joint stiffness, joint deformity, low back pain, muscle weakness, muscle cramps, muscle atrophy, leg pain at night, leg pain with exertion, and shoulder pain / LOM hand / wrist pain (CTS). Derm:  Denies rash, itching, dry skin, hives, moles, warts, and unhealing ulcers. Psych:  Denies depression, anxiety, memory loss, suicidal ideation, hallucinations, paranoia, phobia, and confusion. Heme:  Denies bruising, bleeding, and enlarged lymph nodes.  Physical Exam  General:  Well developed, well nourished, no acute distress. Head:  Normocephalic and atraumatic. Eyes:  sclera nonicteric Mouth:  No deformity or lesions, dentition normal. Neck:  Supple; no masses or thyromegaly. Heart:  Regular rate and rhythm; no murmurs, rubs,  or bruits. Abdomen:  Bowel sounds normal.  Abdomen is soft, nontender, nondistended.  No rebound or guarding.  No hepatosplenomegaly, masses or hernias.  No abdominal bruits.  Msk:  Symmetrical with no gross deformities. Normal posture. Extremities:  No clubbing, cyanosis, edema or deformities noted. Neurologic:  Alert and  oriented x4;  grossly normal neurologically. Skin:  Intact without significant lesions or rashes. Cervical Nodes:  No significant cervical adenopathy. Psych:  Alert and cooperative. Normal mood and affect.   Impression & Recommendations:  Problem # 1:  DIARRHEA (ICD-787.91) Secondary to IBS/microscopic colitis.  Much improved, occasional incontinence.  Orders: Est. Patient Level III [id number])  Problem # 2:  COLLAGENOUS COLITIS (ICD-710.9) Lymphocytic colitis, previous treatment w/ Entocort. Now using pepto & imodium and getting  along fine except occ incontinence.  We discussed resuming entocort, but pt feels she is doing well on above regimen & given cost entocort will hold off for now.  Orders: Est. Patient Level III [id number])  Problem # 3:  GERD (ICD-530.81) Well controlled on omperazole BID  Problem # 4:  DYSPHAGIA UNSPECIFIED (ICD-787.20) Resolved s/p dialtion Schatzki's ring  Complete Medication List: 1)  Levothyroxine Sodium 88 Mcg Tabs (Levothyroxine sodium) .... Once daily 2)  Atenolol 25 Mg Tabs (Atenolol) .... 2 once daily 3)  Furosemide 40 Mg Tabs (Furosemide) 4)  Simvastatin 20 Mg Tabs (Simvastatin) 5)  Klor-con M10 10 Meq Cr-tabs (Potassium chloride crys cr) .Marland Kitchen.. 89meq daily 6)  Dilitazem Extended Release 180 Once Daily  7)  Ferro-bob 325 (65 Fe) Mg Tabs (Ferrous sulfate) 8)  Allopurinol 100 Mg Tabs (Allopurinol) .... 2 once daily 9)  Zolpidem Tartrate 10 Mg Tabs (Zolpidem tartrate) .... Qhs 10)  Fenofibrate 160 Mg Tabs (Fenofibrate) .... Once daily 11)  Indomethacin 50 Mg Caps (Indomethacin) .... As needed 12)  Omeprazole 20 Mg Cpdr (Omeprazole) .... 2 once daily 13)  Calcium 600mg   .... 2 once daily 14)  Fish Oil 1000 Mg Caps (Omega-3 fatty acids) .... 2 once daily 15)  Vitamin E 400iu  .... 2 once daily 16)  Tylenol  .... As needed  Patient Instructions: 1)  Conitnue pepto 2 tabs three times a day as needed diarhea 2)  imodium AD 1-2 daily as needed diarrhea 3)  Continue omeprazole 20mg  bid 4)  Call if diarrhea worsens   Orders Added: 1)  Est.  Patient Level III [id number]

## 2010-09-08 LAB — CLOSTRIDIUM DIFFICILE EIA: C difficile Toxins A+B, EIA: NEGATIVE

## 2010-09-08 LAB — FECAL LACTOFERRIN, QUANT

## 2010-09-08 LAB — OVA AND PARASITE EXAMINATION

## 2010-09-08 LAB — STOOL CULTURE

## 2010-09-26 ENCOUNTER — Telehealth: Payer: Self-pay

## 2010-09-26 NOTE — Telephone Encounter (Signed)
Pt called- stated she is having increased diarrhea in the am. Around 2-3 times in 30 minutes. She has some nausea in the mornings. No fever, no blood in stool, no pain, no appitite. Pt is taking the pepto and immodium. . Pt wants to know if there is anything else she can do because it seems to be getting worse. Please advise

## 2010-09-27 NOTE — Telephone Encounter (Signed)
LMOM to call.

## 2010-09-27 NOTE — Telephone Encounter (Signed)
Informed pt she will need appt.. Has one for May but needs earlier appt. Transferred call to Manuela Schwartz to schedule.

## 2010-09-27 NOTE — Telephone Encounter (Signed)
Needs OV to discuss options [id number] colitis

## 2010-10-03 LAB — COMPREHENSIVE METABOLIC PANEL
AST: 21 U/L (ref 0–37)
Albumin: 3.5 g/dL (ref 3.5–5.2)
BUN: 16 mg/dL (ref 6–23)
Calcium: 9.4 mg/dL (ref 8.4–10.5)
Creatinine, Ser: 1.32 mg/dL — ABNORMAL HIGH (ref 0.4–1.2)
GFR calc Af Amer: 51 mL/min — ABNORMAL LOW (ref >60)
Total Protein: 6.8 g/dL (ref 6.0–8.3)

## 2010-10-03 LAB — CBC
HCT: 40.8 % (ref 36.0–46.0)
MCV: 94.8 fL (ref 78.0–100.0)
Platelets: 236 10*3/uL (ref 150–400)
RDW: 12.5 % (ref 11.5–15.5)
WBC: 7.5 10*3/uL (ref 4.0–10.5)

## 2010-10-03 LAB — DIFFERENTIAL
Basophils Absolute: 0.1 10*3/uL (ref 0.0–0.1)
Eosinophils Relative: 3 % (ref 0–5)
Lymphocytes Relative: 29 % (ref 12–46)
Lymphs Abs: 2.2 10*3/uL (ref 0.7–4.0)
Monocytes Absolute: 0.7 10*3/uL (ref 0.1–1.0)
Monocytes Relative: 9 % (ref 3–12)
Neutro Abs: 4.4 10*3/uL (ref 1.7–7.7)

## 2010-10-04 ENCOUNTER — Encounter: Payer: Self-pay | Admitting: Urgent Care

## 2010-10-04 ENCOUNTER — Ambulatory Visit (INDEPENDENT_AMBULATORY_CARE_PROVIDER_SITE_OTHER): Payer: Medicare Other | Admitting: Urgent Care

## 2010-10-04 VITALS — BP 118/75 | HR 57 | Temp 98.8°F | Ht 58.5 in | Wt 152.6 lb

## 2010-10-04 DIAGNOSIS — Z862 Personal history of diseases of the blood and blood-forming organs and certain disorders involving the immune mechanism: Secondary | ICD-10-CM

## 2010-10-04 DIAGNOSIS — M359 Systemic involvement of connective tissue, unspecified: Secondary | ICD-10-CM

## 2010-10-04 DIAGNOSIS — K219 Gastro-esophageal reflux disease without esophagitis: Secondary | ICD-10-CM

## 2010-10-04 DIAGNOSIS — R110 Nausea: Secondary | ICD-10-CM | POA: Insufficient documentation

## 2010-10-04 NOTE — Progress Notes (Signed)
Referring Provider: No ref. provider found Primary Care Physician:  Wende Neighbors, MD Primary Gastroenterologist:  Dr. Gala Romney  Chief Complaint  Patient presents with  . Diarrhea    for a couple months, getting better per pt    HPI:  [name] is a 55 y.o. female here for follow up for microscopic colitis. She is complaining of some chronic nausea as well. Diarrhea much better.  Only taking imodium 1-2 per day a couple times per month after diarrhea.  C/o lower abd pain once every couple days.  Lasts about 67mins.  Generally, 1 non-bloody BM daily.  Occ diarrhea 2-3 times first thing in AM.  GERD well controlled.  Taking omeprazole 20mg  daily.  Appetite ok.  Weight stable.  C/o early AM nausea couple times per week.  Denies new meds.  Blood sugars been running high, being evaluated for possible diabetes through Dr Nevada Crane.  Past Medical History  Diagnosis Date  . Microscopic colitis 9/11    Colonoscopy  . Hemorrhoids   . Diverticulosis   . Schatzki's ring     Last EGD with esophageal dilatation 54F  9/11  . Congenital heart disease   . Paroxysmal SVT (supraventricular tachycardia)   . IDA (iron deficiency anemia)   . GERD (gastroesophageal reflux disease)   . Hypothyroidism   . Hypertension   . Hyperlipidemia   . Renal insufficiency   . Gout   . PVD (peripheral vascular disease)   . Hyperparathyroidism   . Scoliosis   . Chronic bronchitis     Past Surgical History  Procedure Date  . Asd repair Age [facility]  . Right common femoral endarterectomy 2007  . Left parathyroidectomy 2007  . Parathyroid adenoma 2007  . Left hemithyroidectomy   . Tonsillectomy   . Hemorrhoid surgery 2010    Current Outpatient Prescriptions  Medication Sig Dispense Refill  . allopurinol (ZYLOPRIM) 100 MG tablet Take 1 tablet by mouth Twice daily.      Marland Kitchen atenolol (TENORMIN) 50 MG tablet Take 1 tablet by mouth daily.      . calcium carbonate (OS-CAL) 600  MG TABS Take 600 mg by mouth 2 (two) times daily with a meal.        . clobetasol (TEMOVATE) 0.05 % cream Apply 1 application topically Three times a day.      Marland Kitchen COLCRYS 0.6 MG tablet Take 1 tablet by mouth Twice daily.      Marland Kitchen diltiazem (CARDIZEM CD) 180 MG 24 hr capsule Take 1 tablet by mouth daily.      . fenofibrate 160 MG tablet Take 1 tablet by mouth daily.      . ferrous sulfate 325 (65 FE) MG tablet Take 325 mg by mouth daily with breakfast.        . fish oil-omega-3 fatty acids 1000 MG capsule Take 1 g by mouth 2 (two) times daily.        Marland Kitchen KLOR-CON M10 10 MEQ tablet Take 1 tablet by mouth daily.      Marland Kitchen levothyroxine (SYNTHROID, LEVOTHROID) 100 MCG tablet Take 1 tablet by mouth daily.      Marland Kitchen omeprazole (PRILOSEC) 20 MG capsule Take 20 mg by mouth 2 (two) times daily.        . VENTOLIN HFA 108 (90 BASE) MCG/ACT inhaler Take 1 puff by mouth Every 4 hours as needed.      . vitamin E 400 UNIT capsule Take 400 Units by  mouth 2 (two) times daily.        Marland Kitchen zolpidem (AMBIEN) 10 MG tablet Take 1 tablet by mouth At bedtime as needed.        Allergies as of 10/04/2010 - Review Complete 10/04/2010  Allergen Reaction Noted  . Hydrocodone-acetaminophen    . Oxycodone hcl    . Penicillins      Family History  Problem Relation Age of Onset  . Stroke Mother   . Lung cancer Father     History   Social History  . Marital Status: Single    Spouse Name: N/A    Number of Children: N/A  . Years of Education: N/A   Occupational History  . Not on file.   Social History Main Topics  . Smoking status: Never Smoker   . Smokeless tobacco: Never Used  . Alcohol Use: No  . Drug Use: No  . Sexually Active: No   Review of Systems: Gen: Denies any fever, chills, sweats, anorexia, fatigue, weakness, malaise, weight loss, and sleep disorder CV: Denies chest pain, angina, palpitations, syncope, orthopnea, PND, peripheral edema, and claudication. Resp: Denies dyspnea at rest, dyspnea with  exercise, cough, sputum, wheezing, coughing up blood, and pleurisy. GI: Denies vomiting blood, jaundice, and fecal incontinence.   Denies dysphagia or odynophagia. Derm: Denies rash, itching, dry skin, hives, moles, warts, or unhealing ulcers.  Psych: Denies depression, anxiety, memory loss, suicidal ideation, hallucinations, paranoia, and confusion. Heme: Denies bruising, bleeding, and enlarged lymph nodes.  Physical Exam: BP 118/75  Pulse 57  Temp 98.8 F (37.1 C)  Ht 4' 10.5" (1.486 m)  Wt 152 lb 9.6 oz (69.219 kg)  BMI 31.35 kg/m2  SpO2 97% General:   Alert,  Well-developed, well-nourished, pleasant and cooperative in NAD Head:  Normocephalic and atraumatic. Eyes:  Sclera clear, no icterus.   Conjunctiva pink. Mouth:  No deformity or lesions, dentition normal. Neck:  Supple; no masses or thyromegaly. Heart:  Regular rate and rhythm; no murmurs, clicks, rubs,  or gallops. Abdomen:  Soft, nontender and nondistended. No masses, hepatosplenomegaly or hernias noted. Normal bowel sounds, without guarding, and without rebound.   Msk:  Symmetrical without gross deformities. Normal posture. Pulses:  Normal pulses noted. Extremities:  Without clubbing or edema. Neurologic:  Alert and  oriented x4;  grossly normal neurologically. Skin:  Intact without significant lesions or rashes. Cervical Nodes:  No significant cervical adenopathy. Psych:  Alert and cooperative. Normal mood and affect.

## 2010-10-04 NOTE — Assessment & Plan Note (Addendum)
Lymphocytic colitis, previous treatment with Entocort. Now using when necessary Imodium. She is encouraged to use Imodium 2 mg q. a.m. and she is having some diarrhea. If this does not work would pursue another course of entocort.  I will call w/ Gastric emptying study results Continue omeprazole 20mg  twice a day Imodium 2mg  every morning before getting out of bed If diarrhea persists, call me Begin ALIGN daily (samples 2 boxes & coupon given)

## 2010-10-04 NOTE — Assessment & Plan Note (Signed)
Chronic nausea with recent EGD benign. Present elevated blood sugars. Being evaluated by Dr. Luan Pulling for diabetes mellitus. I suspect she could have gastroparesis.

## 2010-10-04 NOTE — Patient Instructions (Addendum)
I will call w/ Gastric emptying study results Continue omeprazole 20mg  twice a day Imodium 2mg  every morning before getting out of bed If diarrhea persists, call me Begin ALIGN daily (samples 2 boxes & coupon given)

## 2010-10-04 NOTE — Assessment & Plan Note (Signed)
Chronic. Will request recent labs from Dr. Nevada Crane.

## 2010-10-04 NOTE — Assessment & Plan Note (Signed)
Well controlled on omeprazole 20 mg twice a day

## 2010-10-05 ENCOUNTER — Other Ambulatory Visit (HOSPITAL_COMMUNITY): Payer: Self-pay | Admitting: Internal Medicine

## 2010-10-05 DIAGNOSIS — Z139 Encounter for screening, unspecified: Secondary | ICD-10-CM

## 2010-10-05 NOTE — Progress Notes (Signed)
Reviewed by R. Michael Braxston Quinter, MD FACP FACG 

## 2010-10-07 ENCOUNTER — Encounter (HOSPITAL_COMMUNITY)
Admission: RE | Admit: 2010-10-07 | Discharge: 2010-10-07 | Disposition: A | Discharge status: Home / Self Care | Payer: Medicare Other | Source: Ambulatory Visit | Attending: Urgent Care | Admitting: Urgent Care

## 2010-10-07 ENCOUNTER — Encounter (HOSPITAL_COMMUNITY): Payer: Self-pay

## 2010-10-07 DIAGNOSIS — R110 Nausea: Secondary | ICD-10-CM | POA: Insufficient documentation

## 2010-10-07 LAB — TSH: TSH: 2.28 u[IU]/mL (ref 0.41–5.90)

## 2010-10-07 LAB — BASIC METABOLIC PANEL
CO2: 25 mmol/L
Chloride: 103 mmol/L
Glucose: 223
Potassium: 3.6 mmol/L
Sodium: 141 mmol/L (ref 137–147)

## 2010-10-07 MED ORDER — TECHNETIUM TC 99M SULFUR COLLOID
2.0000 | Freq: Once | INTRAVENOUS | Status: AC | PRN
  Administered 2010-10-07: 1.9 via ORAL

## 2010-10-13 ENCOUNTER — Ambulatory Visit: Payer: Medicare Other

## 2010-10-28 ENCOUNTER — Encounter (INDEPENDENT_AMBULATORY_CARE_PROVIDER_SITE_OTHER): Payer: Medicare Other

## 2010-10-28 ENCOUNTER — Ambulatory Visit (INDEPENDENT_AMBULATORY_CARE_PROVIDER_SITE_OTHER): Payer: Medicare Other

## 2010-10-28 ENCOUNTER — Ambulatory Visit (HOSPITAL_COMMUNITY)
Admission: RE | Admit: 2010-10-28 | Discharge: 2010-10-28 | Disposition: A | Discharge status: Home / Self Care | Payer: Medicare Other | Source: Ambulatory Visit | Attending: Internal Medicine | Admitting: Internal Medicine

## 2010-10-28 DIAGNOSIS — Z48812 Encounter for surgical aftercare following surgery on the circulatory system: Secondary | ICD-10-CM

## 2010-10-28 DIAGNOSIS — I872 Venous insufficiency (chronic) (peripheral): Secondary | ICD-10-CM

## 2010-10-28 DIAGNOSIS — I739 Peripheral vascular disease, unspecified: Secondary | ICD-10-CM

## 2010-10-28 DIAGNOSIS — Z139 Encounter for screening, unspecified: Secondary | ICD-10-CM

## 2010-10-28 DIAGNOSIS — Z1231 Encounter for screening mammogram for malignant neoplasm of breast: Secondary | ICD-10-CM | POA: Insufficient documentation

## 2010-10-28 NOTE — Procedures (Unsigned)
LOWER EXTREMITY ARTERIAL DUPLEX  INDICATION:  Followup peripheral artery disease.  HISTORY: Diabetes:  No. Cardiac:  No. Hypertension:  Yes. Smoking:  No. Previous Surgery:  Right femoral endarterectomy on 10/16/2005.  SINGLE LEVEL ARTERIAL EXAM                         RIGHT                LEFT Brachial:               125                  126 Anterior tibial:        143                  144 Posterior tibial:       133                  147 Peroneal: Ankle/Brachial Index:   1.13                 1.17  LOWER EXTREMITY ARTERIAL DUPLEX EXAM  DUPLEX:  Elevated velocities present in the right common femoral artery suggesting 50% to 75% stenosis with history of endarterectomy.  IMPRESSION: 1. Findings involving right common femoral artery may be overestimated     due to no visualization of plaque and widely patent endarterectomy     site. 2. Remainder of right lower extremity arterial system visualized     appears patent. 3. Bilateral ankle brachial indices appear within normal range and     unchanged from previous study on 10/22/2008.   ___________________________________________ Rosetta Posner, M.D.  SH/MEDQ  D:  10/28/2010  T:  10/28/2010  Job:  [id number]

## 2010-10-29 NOTE — Assessment & Plan Note (Signed)
OFFICE VISIT  [name], [name] DOB:  Jun 09, 1956                                       10/28/2010 [id number]  This is a former patient of Dr. Amedeo Plenty.  Dr. Bridgett Larsson is the attending physician in the office on 10/28/2010.  The patient is a 55 year old woman who had a right femoral endarterectomy in April of 2007 by Dr. Amedeo Plenty.  She has done well since that time.  She has no symptoms of claudication and her ABIs have been greater than 1 as they are today.  Of note, on vascular exam she had elevated velocities present in the right common femoral artery suggesting 50%-75% stenosis which may be overestimated due to no visualization of the widely patent endarterectomy site.  Remainder of the right lower extremity arterial system appeared patent and there was no change from previous study.  The patient denies any symptoms of claudication and continues to do well.  She does complain however of a new issue which is venous stasis changes in the right lower extremity on the medial aspect of her shin.  She states this has been there for many years but recently has started to burn and become more erythematous.  She would like to have this looked at as well.  Vascular lab done today showed ABIs greater than 1 bilaterally.  MEDICATIONS:  A new list of medications was reviewed with the patient and is in the chart.  PHYSICAL EXAM:  This is a well-developed, well-nourished woman in no acute distress.  Her heart rate was 54, her sats were 97, her respiratory rate was 15.  She had a positive DP pulse palpable on the right which was 1+ and 2+ on the left.  She has some venous stasis changes in the right medial ankle with some obvious varicose veins in the anterior shin with no breakdown or ulcers.  ASSESSMENT: 1. Patent right common femoral endarterectomy with patch angioplasty     and normal ankle brachial indices.  She will follow up with [place] in 1     year regarding  this. 2. Venous stasis changes in the right lower extremity.  She will     return in 1-2 months to see Dr. Donnetta Hutching or Dr. Kellie Simmering for venous     reflux exam and possible treatment of her varicose veins and venous     stasis changes.  Wray Kearns, PA-C  [place], MD Electronically Signed  RR/MEDQ  D:  10/29/2010  T:  10/29/2010  Job:  [id number]

## 2010-10-31 ENCOUNTER — Encounter: Payer: Self-pay | Admitting: Urgent Care

## 2010-10-31 ENCOUNTER — Ambulatory Visit (INDEPENDENT_AMBULATORY_CARE_PROVIDER_SITE_OTHER): Payer: Medicare Other | Admitting: Urgent Care

## 2010-10-31 VITALS — BP 111/71 | HR 51 | Temp 97.8°F | Ht <= 58 in | Wt 153.0 lb

## 2010-10-31 DIAGNOSIS — K219 Gastro-esophageal reflux disease without esophagitis: Secondary | ICD-10-CM

## 2010-10-31 DIAGNOSIS — R110 Nausea: Secondary | ICD-10-CM

## 2010-10-31 DIAGNOSIS — M359 Systemic involvement of connective tissue, unspecified: Secondary | ICD-10-CM

## 2010-10-31 NOTE — Patient Instructions (Signed)
Continue ALIGN daily Continue omeprazole 20mg  daily

## 2010-10-31 NOTE — Assessment & Plan Note (Addendum)
Well controlled on omeprazole 20mg  bid.  Hx Schatzki's ring.

## 2010-10-31 NOTE — Assessment & Plan Note (Addendum)
Resolved.  On PPI for GERD.

## 2010-10-31 NOTE — Assessment & Plan Note (Addendum)
Doing well w/ prn early morning imodium & Align

## 2010-10-31 NOTE — Progress Notes (Signed)
Primary Care Physician:  Wende Neighbors, MD Primary Gastroenterologist:  Dr. Gala Romney  Chief Complaint  Patient presents with  . Follow-up    HPI:  [name] is a 55 y.o. female here for follow up for microscopic colitis, abd pain, GERD & nausea.  Denies abd pain or nausea.  Diarrhea much better, taking imodium qAM.  Has not had to use any Pepto.  Hx microscopic colitis, responded well to entocort previously.   Trying to lose weight->watching what she eats.  Appetite ok.  Hx GERD, Schatzki's ring doing well on omeprazole daily.  Recent GES normal.  Celiac AB panel negative.     Past Medical History  Diagnosis Date  . Microscopic colitis 9/11    Colonoscopy  . Hemorrhoids   . Diverticulosis   . Schatzki's ring     Last EGD with esophageal dilatation 41F  9/11  . Congenital heart disease   . Paroxysmal SVT (supraventricular tachycardia)   . IDA (iron deficiency anemia)   . GERD (gastroesophageal reflux disease)   . Hypothyroidism   . Hypertension   . Hyperlipidemia   . Renal insufficiency   . Gout   . PVD (peripheral vascular disease)   . Hyperparathyroidism   . Scoliosis   . Chronic bronchitis     Past Surgical History  Procedure Date  . Asd repair Age [facility]  . Right common femoral endarterectomy 2007  . Left parathyroidectomy 2007  . Parathyroid adenoma 2007  . Left hemithyroidectomy   . Tonsillectomy   . Hemorrhoid surgery 2010    Current Outpatient Prescriptions  Medication Sig Dispense Refill  . allopurinol (ZYLOPRIM) 100 MG tablet Take 1 tablet by mouth Twice daily.      Marland Kitchen atenolol (TENORMIN) 50 MG tablet Take 1 tablet by mouth daily.      . calcium carbonate (OS-CAL) 600 MG TABS Take 600 mg by mouth 2 (two) times daily with a meal.        . clobetasol (TEMOVATE) 0.05 % cream Apply 1 application topically Three times a day.      Marland Kitchen COLCRYS 0.6 MG tablet Take 1 tablet by mouth Twice daily.      Marland Kitchen diltiazem (CARDIZEM CD)  180 MG 24 hr capsule Take 1 tablet by mouth daily.      . fenofibrate 160 MG tablet Take 1 tablet by mouth daily.      . ferrous sulfate 325 (65 FE) MG tablet Take 325 mg by mouth daily with breakfast.        . fish oil-omega-3 fatty acids 1000 MG capsule Take 1 g by mouth 2 (two) times daily.        Marland Kitchen KLOR-CON M10 10 MEQ tablet Take 1 tablet by mouth daily.      Marland Kitchen levothyroxine (SYNTHROID, LEVOTHROID) 100 MCG tablet Take 1 tablet by mouth daily.      Marland Kitchen omeprazole (PRILOSEC) 20 MG capsule Take 20 mg by mouth 2 (two) times daily.        . Probiotic Product (ALIGN PO) Take 4 mg by mouth.        . VENTOLIN HFA 108 (90 BASE) MCG/ACT inhaler Take 1 puff by mouth Every 4 hours as needed.      . vitamin E 400 UNIT capsule Take 400 Units by mouth 2 (two) times daily.        Marland Kitchen zolpidem (AMBIEN) 10 MG tablet Take 1 tablet by mouth At bedtime as needed.  Allergies as of 10/31/2010 - Review Complete 10/31/2010  Allergen Reaction Noted  . Hydrocodone-acetaminophen    . Oxycodone hcl    . Penicillins     Family History:  There is no known family history of colorectal carcinoma , liver disease, or inflammatory bowel disease.  Review of Systems: Gen: Denies any fever, chills, sweats, anorexia, fatigue, weakness, malaise, weight loss, and sleep disorder CV: Denies chest pain, angina, palpitations, syncope, orthopnea, PND, peripheral edema, and claudication. Resp: Denies dyspnea at rest, dyspnea with exercise, cough, sputum, wheezing, coughing up blood, and pleurisy. GI: Denies vomiting blood, jaundice, and fecal incontinence.   Denies dysphagia or odynophagia. Derm: Denies rash, itching, dry skin, hives, moles, warts, or unhealing ulcers.  Psych: Denies depression, anxiety, memory loss, suicidal ideation, hallucinations, paranoia, and confusion. Heme: Denies bruising, bleeding, and enlarged lymph nodes.  Physical Exam: BP 111/71  Pulse 51  Temp(Src) 97.8 F (36.6 C) (Tympanic)  Ht 4\' 10"   (1.473 m)  Wt 153 lb (69.4 kg)  BMI 31.98 kg/m2 General:   Alert,  Well-developed, well-nourished, pleasant and cooperative in NAD Head:  Normocephalic and atraumatic. Eyes:  Sclera clear, no icterus.   Conjunctiva pink. Mouth:  No deformity or lesions, dentition normal. Neck:  Supple; no masses or thyromegaly. Heart:  Regular rate and rhythm; no murmurs, clicks, rubs,  or gallops. Abdomen:  Soft, nontender and nondistended. No masses, hepatosplenomegaly or hernias noted. Normal bowel sounds, without guarding, and without rebound.   Msk:  Symmetrical without gross deformities. Normal posture. Pulses:  Normal pulses noted. Extremities:  Without clubbing or edema. Neurologic:  Alert and  oriented x4;  grossly normal neurologically. Skin:  Intact without significant lesions or rashes. Cervical Nodes:  No significant cervical adenopathy. Psych:  Alert and cooperative. Normal mood and affect.

## 2010-11-08 NOTE — Assessment & Plan Note (Signed)
[facility] OFFICE NOTE   [name], [name]                      MRN:          [id number]  DATE:06/12/2007                            DOB:          02/29/1956    REFERRING PHYSICIAN:  [facility]   [name] returns to the office for continued assessment and treatment  of dyspnea and congenital heart disease. We have obtained the records  from [facility] and from [facility]. The  patient underwent uncomplicated repair of a sizeable ASD as a child. She  has had no cardiology followup in many years. [place] evaluated her  with an echocardiogram, a contrast echocardiogram, peripheral vascular  studies, a stress nuclear study and a catheterization. They found no  significant cardiac disease. They found a right femoral artery stenosis  that was repaired surgically as previously noted. Her principal [facility]  records concern treatment of hyperparathyroidism and hypothyroidism. She  underwent surgeries in June and October 2007 that included left  hemithyroidectomy, resection of left parathyroid and resection of a  right parathyroid adenoma. This apparently has resulted in cure, as no  further followup is planned.   MEDICATIONS:  Unchanged from her last visit except for over-the-counter  Prilosec, which she is using on a daily basis. She reports adequate  exercise tolerance and only rare episodes of mild dyspnea.   PHYSICAL EXAMINATION:  A pleasant woman in no acute distress.  The weight is 152, unchanged. Blood pressure 130/80, heart rate 60 and  regular, respirations 18.  NECK:  Transverse scar at the base of the neck; no jugular venous  distention.  HEENT:  Minimally hirsute.  LUNGS:  Clear.  CARDIAC:  Normal first and second heart sounds; modest systolic ejection  murmur.  ABDOMEN:  Soft and nontender; no organomegaly; no bruits.  EXTREMITIES:  Trace edema on the left; 1+  pretibial edema on the right.   IMPRESSION:  [name] is doing generally well. Her ASD is cured and  requires no further followup. Her dyspnea is minimal and requires no  further testing at the present time. Her right leg edema is chronic and  of uncertain etiology. It does not require further assessment or  treatment at the present time. Hypertension is well controlled. I plan  to see this nice woman again in one year.     Cristopher Estimable. Lattie Haw, MD, [facility]  Electronically Signed   RMR/MedQ  DD: 06/12/2007  DT: 06/12/2007  Job #: [id number]   cc:   [facility]

## 2010-11-08 NOTE — Assessment & Plan Note (Signed)
[place] [facility] OFFICE NOTE   [name], [name]                      MRN:          [id number]  DATE:06/25/2008                            DOB:          [birth year]    CARDIOLOGIST:  Cristopher Estimable. Lattie Haw, MD, [facility]   PRIMARY CARE PHYSICIAN:  Delphina Cahill, MD   REASON FOR VISIT:  Posthospitalization followup.   HISTORY OF PRESENT ILLNESS:  Ms. Worland is a 55 year old female patient  with a history of ASD repair at 55 years old at [facility] who was recently admitted to [facility] with  paroxysmal supraventricular tachycardia in the setting of microcytic  anemia secondary to upper GI bleeding, as well as iatrogenic  hyperthyroidism.  The patient was evaluated by Gastroenterology for her  microcytic anemia.  She had an EGD performed that demonstrated  esophageal erosions consistent with erosive reflux esophagitis.  She was  continued on proton pump inhibitor therapy and Givens capsule  enteroscopy of her small bowel was arranged and demonstrated erosions  consistent with NSAID induced enteropathy.  She was advised not to use  NSAIDs any further.  Her primary care physician also adjusted her  Synthroid dose while she was hospitalized.  We were asked to see the  patient for SVT.  This seemed to be a long RP tachycardia.  The  possibility of ectopic atrial tachycardia or atypical atrial flutter was  also raised.  Of note, she did have some evidence of volume overload.  A  followup echocardiogram demonstrated normal LV function.  Her RV  systolic pressure was mildly increased.  She was diuresed and it was  hypothesized that she had some diastolic heart failure in the setting of  her supraventricular tachycardia and anemia.  Her atenolol was  continued, and she was placed on diltiazem.  Her blood pressures were  somewhat labile and her diltiazem dose was decreased prior to discharge.   In  the office today, she notes that she is doing much better.  She is on  iron therapy and she actually saw Dr. Tressie Stalker in consultation  yesterday.  Apparently she had some hyperproteinemia noted recently and  he is evaluating her for that.  He asked her to decrease her iron  supplementation.  She denies any chest pain.  She notes much less  shortness of breath with exertion.  She feels that the anemia was  probably contributing to her shortness of breath.  She denies orthopnea  or PND.  She has mild pedal edema without significant increase.  She  denies any syncope.  She noted one episode of palpitations a few days  ago.  This was brief in duration and much improved from prior episodes  of palpitations.   MEDICATIONS.:  Atenolol 50 mg daily, Synthroid 100 mcg daily, Aspirin 81  mg daily, Prilosec OTC 20 mg daily, Furosemide 40 mg daily, Simvastatin  10 mg daily,  Diltiazem CD 180 mg daily, Klor-Con 10 mEq daily, Fish oil 1000 mg  daily, Ferrous sulfate 325 mg daily, Uloric 40 mg daily, Tylenol p.r.n.,  Tums p.r.n., Zolpidem p.r.n.  PHYSICAL EXAMINATION:  GENERAL:  She is a well-nourished, well-developed  female in no acute distress.  VITAL SIGNS:  Blood pressure is 122/80, pulse 64, weight 143 pounds.  HEENT:  Normal neck without JVD.  CARDIAC:  Normal S1 and S2.  Regular rate and rhythm.  LUNGS:  Clear to auscultation bilaterally.  No rales.  ABDOMEN:  Soft, nontender.  EXTREMITIES:  With trace ankle edema bilaterally.  NEUROLOGIC:  She is alert and oriented x3.  Cranial nerves II through  XII grossly intact.   Electrocardiogram demonstrates sinus bradycardia with heart rate of 49,  normal axis, inferior Q waves, poor R-wave progression, T-wave  inversions in V1 through V4 when compared to previous tracing dated  May 20, 2007, there has been no significant change.   ASSESSMENT AND PLAN:  1. Paroxysmal supraventricular tachycardia (long RP tachycardia).  She      is  currently controlled on her current dose of diltiazem and      atenolol.  She is having minimal breakthrough palpitations.  I      discussed the case further today with Dr. Lattie Haw and we will      place her on a 48-hour Holter monitor to better assess her rhythm.      She will be seen back in followup for further recommendations.  She      may ultimately require referral to electrophysiology and possible      radiofrequency catheter ablation for her arrhythmia.  No medication      changes will be made today.  2. Probable, chronic diastolic congestive heart failure.  This was      likely exacerbated by her rapid rate from her supraventricular      tachycardia, as well as her anemia recently.  She seems to be      optivolemic on exam today.  We will check a BMET to follow up on      her renal function and potassium given her ongoing use of      furosemide.  We will also check a BNP to get a better baseline      level of her BNP for future comparison.  3. Microcytic anemia in the setting of upper gastrointestinal bleeding      and NSAID-induced enteropathy and gastropathy.  She will follow up      with Gastroenterology as indicated as well as her primary care      physician for followup on her hemoglobin and hematocrit.  4. Hyperproteinemia.  She is currently being evaluated by Dr.      Tressie Stalker.  5. History of atrial septal defect repair.  As noted previously,      echocardiography has demonstrated that her atrial septal defect      repair is intact.  6. Hypothyroidism.  As noted above, she had a low TSH in the hospital      and her Synthroid dose was adjusted.  Followup will be with her      primary care physician, Dr. Nevada Crane.  7. Hypertension.  This is overall well controlled.  8. Dyslipidemia.  She will continue on simvastatin.   DISPOSITION:  Followup with Dr. Lattie Haw in 1 month after her Holter  monitor is completed for further recommendations if any.  She will  follow up with  her primary care physician, hematologist, and  gastroenterologist as directed.      Richardson Dopp, PA-C  Electronically Signed      Cristopher Estimable. Lattie Haw, MD, [facility]  Electronically Signed  SW/MedQ  DD: 06/25/2008  DT: 06/26/2008  Job #: [id number]   cc:   Delphina Cahill, M.D.

## 2010-11-08 NOTE — Consult Note (Signed)
NAMEGIUSEPPA, [name]               ACCOUNT NO.:  [id number]   MEDICAL RECORD NO.:  [id number]          PATIENT TYPE:  INP   LOCATION:  A338                          FACILITY:  [facility]   PHYSICIAN:  Cristopher Estimable. Lattie Haw, MD, FACCDATE OF BIRTH:  1956/03/02   DATE OF CONSULTATION:  06/09/2008  DATE OF DISCHARGE:                                 CONSULTATION   REFERRING PHYSICIAN:  Dr. Delphina Cahill.   CARDIOLOGIST:  Dr. Lattie Haw.   REASON FOR CONSULTATION:  Tachyarrhythmia.   HISTORY OF PRESENT ILLNESS:  [name] is a 55 year old female.   HISTORY OF PRESENT ILLNESS:  [name] is a 55 year old female patient  with history of ASD repair at age 1, at [facility],  who has recently been evaluated by Dr. Lattie Haw secondary to dyspnea.  She had an echocardiogram in May 2009, that demonstrated normal LV  function and intact ASD repair.  She has continued to note dyspnea with  exertion, but her symptoms worsened yesterday while getting ready for  work.  She noted substantial dyspnea with just getting dressed and had  to stop to rest several times.  She did note tachy palpitations and also  some chest discomfort.  She has had some mild chest tightness with  exertion, as well as yesterday.  Resting would improve her symptoms, but  she continued to feel short of breath at rest.  She denied any syncope.  She sleeps on an incline without significant change recently.  She  denies PND.  She has chronic pedal edema without significant change.  In  the emergency room, she had an ECG that demonstrated SVT with a heart  rate of 144.  She was given IV diltiazem and ER strips indicate that she  slowed down and converted to normal sinus rhythm.  She feels much better  today.  Upon review of her telemetry, she continues to have paroxysms of  supraventricular tachycardia that appear to be either atypical atrial  flutter or some type of atrial tachycardia.  She has been noted to have  heme-positive anemia with an MCV of 66.6.  She has also been noted to  have acute renal insufficiency with an initial creatinine of 1.62.  That  has improved at 1.21 today.  Her TSH is also depressed and her Synthroid  dose has been adjusted.  We are now asked to further evaluate her  tachyarrhythmia.   PAST MEDICAL HISTORY:  1. Congenital heart disease as outlined above.  2. Cardiac catheterization in 2006, normal (done at [facility]      [facility]).  3. Carotid Dopplers in December 2009, demonstrated mild plaque, but no      ICA stenosis.  4. Hypertension.  5. Hypothyroidism.  6. Hyperlipidemia.  7. Iron-deficiency anemia.  8. Diverticulosis.  9. Gout.  10.History of surgical repair of right common femoral artery stenosis      noted at the time of her cardiac catheterization by Dr. Amedeo Plenty.  11.History of thyroid surgery x2.  12.History of hyperparathyroidism, status post left parathyroidectomy      and status post right parathyroid  adenoma excision.  13.Echocardiogram on Nov 07, 2007:  EF 60%, mild mitral regurgitation,      mild biatrial enlargement, ASD repair intact.   MEDICATIONS AT HOME:  1. Synthroid 0.137 mg daily.  2. Atenolol 25 mg daily.  3. Furosemide 40 mg daily.  4. Simvastatin ? nightly.  5. Aspirin 81 mg daily.   ALLERGIES:  1. PENICILLIN.  2. VICODIN.  3. OXYCODONE.   SOCIAL HISTORY:  The patient lives in [place] by herself.  She is not  married and has no children.  She recently started working.  She denies  tobacco or alcohol abuse.   FAMILY HISTORY:  Significant for her mother deceased from complications  of COPD.  She did have a history of stroke in her 71s.  Her father died  from lung cancer.   REVIEW OF SYSTEMS:  Please see HPI.  Denies fevers, chills, headache,  rash, dysuria, hematuria, bright red blood per rectum or melena, nausea,  vomiting, diarrhea or dysphagia.  She has chronic edema with the right  leg being greater than  the left.  She denies syncope, near syncope or  cough.  The rest of the review of systems are negative.   PHYSICAL EXAMINATION:  GENERAL:  She is a well-nourished, well-developed  female in no acute distress.  VITAL SIGNS:  Blood pressure is 102/60, pulse 69, respirations 20,  temperature 98.6.  Oxygen saturation 98% on room air.  HEENT:  Normal.  NECK:  With positive JVD.  LYMPHS:  Without lymphadenopathy.  ENDOCRINE:  Without thyromegaly.  CARDIAC:  Normal S1-S2.  Regular rate and rhythm without murmur.  LUNGS:  Clear to auscultation bilaterally without wheezing, rhonchi or  rales.  SKIN:  Warm and dry.  ABDOMEN:  Soft, nontender with normal bowel sounds.  No organomegaly.  EXTREMITIES:  Trace to 1+ edema bilaterally.  MUSCULOSKELETAL:  Without joint deformity.  NEUROLOGIC:  She is alert and oriented x3.  Cranial nerves II-XII  grossly intact.  VASCULAR:  Without carotid bruits bilaterally.   Chest x-ray:  Abnormal asymmetric interstitial densities in the right  lung attributed to pulmonary edema. Cardiac enlargement without  significant change.  VQ scan:  Low probability for pulmonary embolism.  EKG:  Supraventricular tachycardia with a heart rate 144, normal axis,  nonspecific ST-T wave changes, inferior Q-waves.   LABORATORY DATA:  White count 7400, hemoglobin 8, hematocrit 25.7,  platelet count 408,000.  Of note, her hemoglobin was 10.7 in April 2007.  Sodium 139, potassium 3.9, BUN 20, creatinine 1.21, which is down from  1.62.  Glucose 103.  Point or care CK-MB 1.3.  Point of care troponin-I  less than 0.05.  CK 30, 31, CK-MB 1.3, 0.9.  Troponin-I 0.02, 0.02.  TSH  0.050.  D-dimer 1.43, free T4 of 2.  Hemoccult positive x1.   IMPRESSION:  1. Paroxysmal supraventricular tachycardia.      a.     Rule out atypical atrial flutter versus atrial tachycardia.  2. Acute congestive heart failure.      a.     Probable diastolic heart failure secondary to #1.  3. Heme-positive  microcytic anemia - likely contributing to #1 and #2.  4. Hypothyroidism with depressed TSH.      a.     Synthroid dose recently adjusted by primary care physician.      b.     Question related to #1.  5. Acute renal insufficiency - improved.  6. History of ASD repair.  7. Hypertension.  8. Hyperlipidemia.   PLAN:  The patient was also interviewed and examined by Dr. Lattie Haw.  She demonstrates paroxysms of supraventricular tachycardia which appears  to be long RP tachycardia.  It certainly has the appearance of possible  atypical atrial flutter or possibly atrial tachycardia.  Her beta-  blocker will be continued and diltiazem will be added to her medical  regimen.  We will give her some extra Lasix to treat her congestive  heart failure and recheck a chest x-ray in the morning.  Of note, a  gastrointestinal workup is in progress at this time for her heme-  positive anemia.  It has been several years since her ASD repair and she  should not require any antibiotic prophylaxis prior to her GI procedure.  An echocardiogram will also be obtained to reassess her LV function  since May 2009, given her recent tachyarrhythmia.  Thank you very much  for consultation.  We will be glad to follow the patient throughout the  remainder of this admission.      Richardson Dopp, PA-C      Cristopher Estimable. Lattie Haw, MD, [facility]  Electronically Signed    SW/MEDQ  D:  06/10/2008  T:  06/10/2008  Job:  [id number]   cc:   Delphina Cahill, M.D.  Fax: [phone]

## 2010-11-08 NOTE — Op Note (Signed)
NAMEARLETT, MAIORINO               ACCOUNT NO.:  [id number]   MEDICAL RECORD NO.:  [id number]          PATIENT TYPE:  INP   LOCATION:  [id number]                          FACILITY:  [facility]   PHYSICIAN:  Caro Hight, M.D.      DATE OF BIRTH:  [birth year]   DATE OF PROCEDURE:  06/10/2008  DATE OF DISCHARGE:  06/11/2008                               OPERATIVE REPORT   PROCEDURE:  Small bowel Givens capsule endoscopy.   PROCEDURES:  Small bowel Givens capsule endoscopy.   INDICATIONS FOR PROCEDURE:  [name] is 55 year old lady with history  of recurrent iron-deficiency anemia.  She was evaluated for this back in  2008 and presenting with hematochezia as well.  In September 29, 2006, she  had a colonoscopy by Dr. Laural Golden and was found to have pancolonic  diverticula and external hemorrhoids and her anemia actually corrected  on iron.  She had no further problems and did not requiring further  workup at that point.  She presented back now with presyncopal episode  and found to have a hemoglobin of 9.5.  She has been off PPI therapy,  but has had some heart burning and indigestion.  She previously took  Naprosyn, but recently took ibuprofen 800 mg to 1000 mg daily for the  past several months.  She has no abdominal pain, rectal bleeding, or  melena.  She is Hemoccult positive.  She has remote history of ileitis  in 1994.  She also takes aspirin 81 mg daily.  She underwent an EGD on  June 09, 2008, which revealed a single V-shaped distal esophageal  erosion consistent with erosive reflux esophagitis, noncritical shots  giving not manipulated, single, pallor, tiny erosions.   PROCEDURAL FINDINGS:  The patient swallowed the capsule without any  difficulty.  The first gastric image was at 53 seconds.  First duodenal  image at 1 hour and 3 minutes.  She had a couple of tiny erosions at 1  hour and 37 minutes, 1 hour and 38 minutes, and 1 hour 40 minutes.  At 1  hour and 49 minutes, small bowel  appeared a little bit more edematous.  This is persistent for the remaining upper portion of the small bowel.  There were evidence of few villi along the way.  These findings were  reviewed by Dr. Stann Mainland as well.  There was mild erythema at 4 hours 22  minutes.  However, at 6 hours, the small bowel became very erythemic  with scattered erosions, possibly tiny ulcerations over the course of  the next 1 hour 36 minutes.  Distally, the erosions became more discrete  and larger.  Again, there were multiple lesions.  There was no evidence  of active bleeding.  At 6 hours 51 minutes and 50 seconds, there was an  area what appeared to be scarring from prior ulceration.  The first  ileocecal valve with image at 7 hours 36 minutes and 12 seconds and same  for the first cecal image.   SUMMARY AND RECOMMENDATIONS:  Please note this study was reviewed by Dr.  Stann Mainland as well.  Abnormal appearing small bowel mucosa with edematous  appearing small bowel.  At least, mid to distal small bowel was  involved.  More distally, there were numerous areas of large erosions  with tiny ulceration and one area of scarring.  No evidence of stricture  or tumors.  Per Dr. 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   We did advice no NSAID use for the next 4 weeks.  If needed, could  consider initiating an NSAID from a different class, other than  ibuprofen to see if she tolerate this.  But only do this if her H and H  stabilized and does not improve on iron therapy.  We would also noted  that with concomitant aspirin use, she is at increase risk of ongoing  enteropathy.  We would recommend again a 4-week followup H and H, and if  hemoglobin does not stabilize or improve on iron therapy, and also the  NSAID's she may need to have further workup.   ADDENDUM [id number]:  OPV with Dr. Stann Mainland within the next 2 months, reason:  NSAID enteropathy.      Neil Crouch, P.A.      Caro Hight, M.D.   Electronically Signed    LL/MEDQ  D:  06/11/2008  T:  06/12/2008  Job:  [id number]   cc:   Delphina Cahill, M.D.  Fax: [phone]

## 2010-11-08 NOTE — Group Therapy Note (Signed)
NAME:  [name], [name]               ACCOUNT NO.:  [id number]   MEDICAL RECORD NO.:  [id number]          PATIENT TYPE:  INP   LOCATION:  [id number]                          FACILITY:  [facility]   PHYSICIAN:  Delphina Cahill, M.D.        DATE OF BIRTH:  Jan 25, 1956   DATE OF PROCEDURE:  06/09/2008  DATE OF DISCHARGE:                                 PROGRESS NOTE   SUBJECTIVE:  [name] is a 55 year old admitted for unusual feeling  in her chest and shortness of breath.  She has been having issues with  heartburn, reflux, and continues to have some mild pressure in her  midchest.  She was also found to have atrial fibrillation with RPR in  the emergency department and was started on Cardizem drip, which has  been continued.  She has remained in slow heart rate which appears  occasional abnormal beat, but P-waves apparent at this time.  She denies  any shortness of breath at this time though she has not had any gross  blood in stools, but was heme-positive yesterday and is being assessed  by both Cardiology and GI.   OBJECTIVE:  VITAL SIGNS:  Temperature is 98.6, blood pressure 102/60,  pulse 69, respirations 20, sating 90% on room air.  GENERAL:  This is a white female sitting on the side of bed in no acute  distress.  HEENT:  Unremarkable.  LUNGS:  Clear to auscultation bilaterally.  HEART:  Occasional irregular beat with 2/6 systolic murmur.  ABDOMEN:  Soft, nontender.  Positive bowel sounds.  EXTREMITIES:  No lower extremity edema.  NEUROLOGIC:  Alert and oriented x3.  No deficits noted.   LABORATORY DATA:  Cardiac panel shows CK of 31, MB of 0.9, troponin-I of  0.02, TSH was 0.050, free T4 was 2.00, ferritin was 4.  CBC shows white  count 7.4, hemoglobin 8.0, platelet count of 408.  BMET shows sodium  139, potassium 3.9, chloride 110, CO2 of 21, glucose 103, BUN 20,  creatinine 1.21, calcium of 8.8.  No new radiographic studies at this  time.   IMPRESSION:  This is a 55 year old with what  appears to be  gastrointestinal bleed with anemia, new onset of either atrial  fibrillation RPR versus supraventricular tachycardia and some chest  comfort.   ASSESSMENT/PLAN:  1. Gastrointestinal bleed with anemia.  Her hemoglobin today was 8.0,      this is a significant drop from 9.5 yesterday.  She has not had any      gross bloody stools, but given her symptoms of severe reflux      disease as well as chest discomfort, GI is hoping to do      esophagogastroduodenoscopy on her today to assess to upper GI tract      ulceration or possible esophagitis.  We will go ahead and transfuse      her 2 units today given her heart issues currently.  2. Atrial fibrillation with RVR/supraventricular tachycardia.  It is      unclear and awaiting cardiology consult on patient.  She is on  Cardizem drip currently and likely will be switched to p.o.      Cardizem today by Cardiology.  3. Shortness of breath.  She has some chronic shortness of breath felt      secondary to chronic bronchitis, asthmatic bronchitis type picture      and has had full workup before for this.  She does have some      pulmonary edema on her patches on her chest x-ray, but will need to      further evaluate this.  4. Hypothyroidism.  Dosage of her thyroid medicine is too high      resulting in supratherapeutic levels and may be contributing to      heart irregularity and so we will cut back on this dose to 100 mcg      daily instead of 137 mcg.  We will need to recheck this in another      month to assure the correct dosage.   DISPOSITION:  The patient will be continued in the hospital until  complete workup from cardiac as well as GI perspective and we will wait  further input from him.      Delphina Cahill, M.D.  Electronically Signed     ZH/MEDQ  D:  06/09/2008  T:  06/09/2008  Job:  [id number]

## 2010-11-08 NOTE — H&P (Signed)
[name], [name]               ACCOUNT NO.:  [id number]   MEDICAL RECORD NO.:  [id number]          PATIENT TYPE:  INP   LOCATION:  [id number]                          FACILITY:  [facility]   PHYSICIAN:  Delphina Cahill, M.D.        DATE OF BIRTH:  July 06, 1955   DATE OF ADMISSION:  06/08/2008  DATE OF DISCHARGE:  LH                              HISTORY & PHYSICAL   CHIEF COMPLAINT:  Shortness of breath and weakness.   HISTORY OF PRESENT ILLNESS:  [name] is a 55 year old white female  with multiple medical problems who I first saw 2 weeks ago in the office  and still collecting all of her medical history.  At that time, the  patient had had a recent syncopal episode, but did not seek any medical  treatment that time and recently had lab work obtained at the [facility] where she has been getting most of her medical care up until  recently.  I did get those records, which revealed that she had anemia  of 9.5.  Given some of her symptoms with slurring in her speech and  slowness, I did obtain a CT scan at that time on May 26, 2008, which  showed no acute intracranial abnormality, about question right vertebral  artery atherosclerosis.  Ultrasound of carotid was also obtained, which  showed tortuous internal carotid systems bilateral with minimal plaque  formation.  No evidence of hemodynamically significant stenosis.  She  continued to improve from this and not have any other incident.  She did  report that she still did have some weakness and dizziness type spells  and came back into the office last Friday with complaints of pain in her  toes.  I did obtain all of her lab work at that time, which came back  today revealing that she had anemia.  At that time, hemoglobin of 8.5  was significantly low, MCV in the 67 range, and a low ferritin of 7 and  percent saturation of 7.  She also had hyperuricemia with uric acid  level of 11.1.  Apparently, today she came into the emergency department  because she woke up this morning feeling more short of breath and  essentially gasping for breath, which was new for her.  She previously  had dyspnea-type feelings, which with cardiac workup was felt related to  more of lung issues and question chronic bronchitis, asthma-type  picture.  When assessed in the emergency department, found to have  question AFib with RVR and was given a bolus of Cardizem converting her  rate back to slowing of the heart rate and some relief of her symptoms.  She stated when she got this, most of her symptoms had subsided.  She  again did flip back in after starting on the drip for hour and half and  was given another low-dose bolus, which converted her back down in the  60-70 range for her heart rate.  At this time, she does not have any  problems with chest pain, feels her breathing is at stable level, still  occasionally  gets dizziness-type spells.  Of note, related to anemia,  she has not noticed any black tarry stools, but has had significant  heartburn-type complaints, which have been routine for the last several  months, but a question whether worsening.  She has been taking  considerable amounts of ibuprofen and Aleve for joint and various other  pains.   PAST MEDICAL HISTORY:  1. Significant for hypertension and chronic bronchitis with last      pulmonary function test done in May 2009, showing severely reduced      DLCO.  2. Hypercholesterolemia.  3. Diverticulosis on colonoscopy done in April 2008.  4. History of iron deficiency anemia.  5. Chronic renal insufficiency with baseline creatinine on the 1.2-1.4      range, seeing Dr. Lowanda Foster.  6. History of right common femoral endarterectomy done by Dr. Amedeo Plenty in      2007.  7. History of hypothyroidism status post partial thyroidectomy and      parathyroidectomy at [facility] done in 2007.  8. Significant ASD repair at [facility] at age 55 in 68.  56. 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  Initially      it felt related to Turner syndrome, but does not appear to be that.  10.She has had a tonsillectomy and adenoidectomy as well.   MEDICATIONS:  1. She is on Synthroid 137 mcg once daily.  2. Atenolol 25 mg once daily.  3. Furosemide 40 mg p.o. daily.  4. Simvastatin 40 mg p.o. daily.  5. Aspirin 81 mg p.o. daily.  6. Recently given indomethacin for the past 3 days 50 mg t.i.d. as      well as colchicine 0.6 mg b.i.d. for 1 week related to gout.   ALLERGIES:  VICODIN/HYDROCODONE and PENICILLIN.   FAMILY HISTORY:  Mother died at age 38 with history of COPD, father died  at 60 secondary to lung cancer, and has a healthy brother.   SOCIAL HISTORY:  She is single.  Lives next door to her brother.  She  works at [employer].  No children.  No tobacco, alcohol, or drug  use.   REVIEW OF SYSTEMS:  Per HPI.  The patient denies any further neurologic  complaints.  At this time, no other syncopal episodes.   PHYSICAL EXAMINATION:  VITAL SIGNS:  Temperature is 97.3, blood pressure  is 110/59, pulse is 64, respirations 18, satting 100% on 2 L of oxygen.  GENERAL:  This is a white female, very pleasant and cooperative in no  acute distress.  She does have some genetic defect, but not clear what  type from general appearance.  HEENT:  No scleral icterus.  Mucous membranes are moist.  NECK:  No JVD.  No thyromegaly.  Does have scar on lower neck from  surgery.  HEART:  Regular rate with occasional abnormal beat at this time, a  question of 2/6 systolic murmur.  LUNGS:  Clear to auscultation bilaterally.  No rhonchi or wheezing.  ABDOMEN:  Soft, nontender.  No masses appreciated.  RECTAL:  Performed in the emergency department showed normal tone, but  did show heme-positive stools.  EXTREMITIES:  No lower extremity edema.  Does have palpable pulses in  all extremities.  Does have varicosities and does not have any further  erythema and pain  in left foot as previously assessed 3 days ago.   LABORATORY DATA:  CBC shows a white count of 9.4, hemoglobin of 9.5, MCV  of 66.6, and  platelet count of 497.  BMET shows a sodium of 141,  potassium 4.3, chloride 110, CO2 of 22, glucose of 161, BUN of 30,  creatinine of 1.62, and calcium of 9.8.  Cardiac markers initially  myoglobin of 142, troponin I of less than 0.05, MB of 1.3, and D-dimer  of 1.43.  UA shows a small amount of blood, but negative for everything  else.  Urine micro just 0-2 rbc's.  Fecal occult positive.  Cardiac  panel showed CK of 28, MB of 1.5, and troponin I of 0.03.   Chest x-ray revealed abnormal asymmetric interstitial densities on right  lung attributed to pulmonary edema.  Cardiac enlargement without  significant change.  A nuclear med study showed a low probability, less  than 10% risk of pulmonary emboli, sensitivity is mildly reduced due to  technical difficulty.   IMPRESSION:  [name] is a 55 year old white female with multiple  medical problems who has presented with several different problems that  may be all interrelated including iron deficiency anemia with heme-  positive stools in addition to recent syncopal episode with negative  workup thus far and new onset of what appears to be atrial fibrillation  with rapid ventricular response or supraventricular tachycardia.   ASSESSMENT AND PLAN:  1. Atrial fibrillation with rapid ventricular      response/supraventricular tachycardia.  It is unclear looking at      the EKGs, there are several ones that present with P-waves      normally, but when in rapid rate it is difficult to ascertain P-      waves and this very well could be atrial fibrillation.  It is      unclear whether this could have contributed to a syncopal episode      that she had approximately 4 weeks ago, but was seen in our office      approximately 2 weeks ago.  She was started on Cardizem drip and      immediately responded to  this therapy and will continue on the drip      until seen and assessed by Cardiology in the morning.  We will then      change her over to oral regimen of Cardizem routinely.  She has had      cardiac workup before and we will get Cardiology input further      given her congenital abnormalities noted previously.  This very      well could have all been brought on by her anemia.  It is unclear      how much she responded before, but she has had iron deficiency      anemia before seen and assessed by Dr. Laural Golden.  Do not have her old      lab work to compare to.  Last known CBC that have available is      April 2007, which showed hemoglobin of 10.7 and MCV of 82.6.  2. Iron deficiency anemia and question gastrointestinal bleed.  She      has multiple issues that could be going on with this.  Likely will      need EGD once seen and assessed by Cardiology.  I appreciate the      input from GI on this with significant MCV, she will need to be      started on iron tablets.  Given the percent saturation of 7 and      ferritin very low at 7 as well,  this very well may be related to      Aleve, ibuprofen, and NSAID overuse and could be contributing to      either diverticular-type bleed or esophagitis.  She has noted that      she has had some significant reflux disease and with occasional      chest pain, was taking multiple medications with eventual relief,      was not been on a PPI, only taking Zantac and occasion Maalox      p.r.n.  3. Shortness of breath and dyspnea on exertion.  There is question      whether this is all related to current cardiac issue, but the      patient has had workup before and felt that it could be related to      asthma or reactive airway disease type component with significantly      abnormal PFTs, but has not been on any medications specifically for      this.  Her oxygen level has remained good in the hospital thus far.      CT scan showed abnormal asymmetric  interstitial densities in the      right lung, which may attribute to pulmonary edema, question      whether this is related to recent atrial fibrillation.  4. Chronic renal insufficiency.  She has been seen and assessed by Dr.      Lowanda Foster and baseline creatinine approximately 1.2-1.4.  Given      multiple NSAIDs, this could have made this worse acutely related to      Indocin and colchicine, which she was just started on, but has had      some renal insufficiency for sometime now.  5. Gout flare.  She did have gout flare in her foot.  She has also had      a history of chondrocalcinosis noted before on x-ray in May 2009.      She did have an elevated uric acid level of 9.7 initially, but on      recheck just Friday it was 11.1, very likely will need to be      started on some allopurinol for preventative maintenance after all      of this other medical issues are worked out.  6. Hypothyroidism.  We will check a TSH and free T4.  It is unclear      when the last time this was checked and make sure that she is not      too hyperthyroid given the atrial fibrillation, and those tests are      still pending at this time.   DISPOSITION:  We will continue in the hospital with awaiting Cardiology  input on her heart and further GI input on GI bleed.  Initially in my  office, her hemoglobin had dropped again to 8.5, which was 1 unit down  from previous 2-3 weeks prior to that and she was typed and crossed 2  units, but without any intervention, she came back up to 9.5 over 3  days, maybe in the range of standard deviation, but we will recheck a  CBC and BMET in the morning.  She is  typed and crossed 2 units.  Given the fact that this could be causing  some irritation on her heart and definitely could be causing the  shortness of breath, syncope and presyncopal-type episodes.  We will  hold off on any Lovenox prophylaxis and DVT prophylaxis given the fact  she may have bleed.      Delphina Cahill, M.D.  Electronically Signed     ZH/MEDQ  D:  06/08/2008  T:  06/09/2008  Job:  [id number]

## 2010-11-08 NOTE — Procedures (Signed)
BYPASS GRAFT EVALUATION   INDICATION:  Followup lower extremity arterial evaluation.   HISTORY:  Diabetes:  No.  Cardiac:  Coronary artery bypass graft in 1996.  Hypertension:  Yes.  Smoking:  No.  Previous Surgery:  Right common femoral endarterectomy in 09/2005 by Dr.  Amedeo Plenty.   SINGLE LEVEL ARTERIAL EXAM                               RIGHT              LEFT  Brachial:                    115                119  Anterior tibial:             139                150  Posterior tibial:            133                136  Peroneal:  Ankle/brachial index:        1.17               1.26   PREVIOUS ABI:  Date:  10/24/2007  RIGHT:  1.27  LEFT:  1.25   LOWER EXTREMITY BYPASS GRAFT DUPLEX EXAM:   DUPLEX:  Triphasic duplex waveform noted throughout the right common  femoral, superficial femoral and proximal profunda femoral artery with  no evidence of stenosis.   IMPRESSION:  Patent right common femoral artery endarterectomy site with  normal bilateral ABIs.   ___________________________________________  P. Drucie Opitz, M.D.   AC/MEDQ  D:  10/22/2008  T:  10/22/2008  Job:  [id number]

## 2010-11-08 NOTE — Procedures (Signed)
NAME:  [name], [name]               ACCOUNT NO.:  [id number]   MEDICAL RECORD NO.:  [id number]          PATIENT TYPE:  OUT   LOCATION:  RESP                          FACILITY:  [facility]   PHYSICIAN:  Edward L. Luan Pulling, M.D.DATE OF BIRTH:  [birth year]   DATE OF PROCEDURE:  DATE OF DISCHARGE:                            PULMONARY FUNCTION TEST   1. Spirometry shows no ventilatory defect and does show evidence of      airflow obstruction at the level of the smaller airways.  2. Lung volumes are normal.  3. DLCO is severely reduced.  4. Arterial blood gases are normal.   Dictation ended at this point.      Edward L. Luan Pulling, M.D.  Electronically Signed     ELH/MEDQ  D:  11/06/2007  T:  11/06/2007  Job:  [id number]   cc:   Sherrilee Gilles. Gerarda Fraction, MD  Fax: [phone]

## 2010-11-08 NOTE — Op Note (Signed)
[name], [name]               ACCOUNT NO.:  [id number]   MEDICAL RECORD NO.:  [id number]          PATIENT TYPE:  INP   LOCATION:  [id number]                          FACILITY:  [facility]   PHYSICIAN:  R. Garfield Cornea, M.D. DATE OF BIRTH:  03-05-1956   DATE OF PROCEDURE:  06/09/2008  DATE OF DISCHARGE:                               OPERATIVE REPORT   PROCEDURE:  Esophagogastroduodenoscopy diagnostic.   INDICATIONS FOR PROCEDURE:  A 38-year lady admitted to hospital with  acute-on-chronic microcytic anemia, Hemoccult-positive stool in a  setting of NSAID use.  She had a negative colonoscopy in 2008 by Dr.  Melony Overly.  She is here for an EGD.  Today, shortness of breath has been  evaluated with VT scan which was low probability for a PE.  She is also  seen by Cardiology.  Also, she has a history of AST repair as a child  and we touched base with Cardiology and has not felt she needs SBE  prophylaxis.   PROCEDURE NOTE:  O2 saturation, blood pressure, pulse and respirations  were monitored throughout the entirety of procedure.   CONSCIOUS SEDATION:  Versed 4 mg IV, Demerol 75 mg IV in divided doses.  Cetacaine spray for topical pharyngeal anesthesia.   INSTRUMENT:  Pentax video chip system.   FINDINGS:  Examination of tubular esophagus revealed a single V-shaped  distal esophageal erosions.  The distal esophageal erosion at the EG  junction was also noncritical ring, otherwise esophageal mucosa appeared  normal.  EG junction easily traversed.  The stomach:  Gastric cavity was  empty, insufflated well with air.  A thorough examination of the gastric  mucosa including retroflexion view of the proximal stomach,  esophagogastric junction demonstrated a single pyloric channel erosion.  There was no ulcer infiltrating process.  Pylorus was easily traversed.  Examination of the bulb second and third portion revealed entirely  normal-appearing mucosa.   THERAPEUTIC/DIAGNOSTIC MANEUVERS PERFORMED:   None.   The patient tolerated the procedure well and was reactive endoscopy.   IMPRESSION:  1. Single V-shaped distal esophageal erosion consistent with erosive      reflux esophagitis.  2. Noncritical Schatzki's ring not manipulated, otherwise unremarkable      esophagus.  3. Single pyloric channel erosion, otherwise normal stomach, normal D1      through D3.   RECOMMENDATIONS:  We will proceed with a given capsule study of her  small bowel to further evaluate the etiology of her bleeding.  She  should continue on the proton-pump inhibitor for gastroesophageal reflux  disease with avoid nonsteroidal agents for the time being.  Further  recommendations to follow.  We will set up a capsule study with the  small bowel for June 10, 2008.      [name], M.D.  Electronically Signed     RMR/MEDQ  D:  06/09/2008  T:  06/10/2008  Job:  [id number]   cc:   Delphina Cahill, M.D.  Fax: [phone]

## 2010-11-08 NOTE — Group Therapy Note (Signed)
NAME:  [name], NAPOLEON NO.:  [id number]   MEDICAL RECORD NO.:  [id number]          PATIENT TYPE:  INP   LOCATION:  [id number]                          FACILITY:  [facility]   PHYSICIAN:  Delphina Cahill, M.D.        DATE OF BIRTH:  12-11-1955   DATE OF PROCEDURE:  06/10/2008  DATE OF DISCHARGE:                                 PROGRESS NOTE   SUBJECTIVE:  [name] is 55 year old admitted with unusual feeling in  her chest found to have heart arrhythmia question related to PSVT, but  could also be atrial tachycardia or atypical flutter.  She states she  has been doing well, is not feeling problems.  No report of shortness of  breath, has not noticed any blood in her stools or black tarry stools.  She has been seen and assessed by GI for heme-positive stools and  anemia, and had Givens capsule done today also followed by [facility]  Cardiology for her heart arrhythmia.   OBJECTIVE:  VITAL SIGNS:  Temperature is 98.3, blood pressure is 101/58,  pulse 52, respirations 18, satting 98% on room air.  GENERAL:  This white female seen in side of bed in no acute distress.  HEENT:  Unremarkable.  LUNGS:  Clear to auscultation bilaterally.  No rhonchi or wheezing.  I  do not appreciate any crackles at the bases.  HEART:  Regular rate and rhythm at this time, possibly bradycardic with  more faint murmur than previous.  ABDOMEN:  Soft, nontender, positive bowel sounds.  EXTREMITIES:  No lower extremity edema.  NEUROLOGIC:  Alert and oriented x3.  No deficits noted.   LABORATORY DATA:  CBC shows white count of 8.6, hemoglobin of 11.9, MCV  of 71.3, platelet count of 409.  BMET shows sodium of 140, potassium of  4.2, chloride of 111, CO2 of 22, glucose of 105, BUN of 10, creatinine  of 1.17, calcium of 8.9.  Other laboratory data obtained yesterday  evening showed a BNP of 791.   Chest x-ray shows question small right pleural effusion, but bibasilar  atelectasis.   Echocardiogram shows  overall left ventricular systolic function was  normal.  Aortic valve mildly calcified.  Left atrium mild-to-moderately  dilated.  Right ventricle was mildly dilated with mild right ventricular  hypertrophy.  Right atrium was mildly dilated.  No significant interval  change since May 2009.   IMPRESSION:  A 55 year old with gastrointestinal bleed, anemia,  supratherapeutic thyroid medication, new onset of either  supraventricular tachycardia versus atypical aflutter.   ASSESSMENT AND PLAN:  Gastrointestinal with anemia.  Her hemoglobin has  responded to 2 units of blood up to 11.9.  She has significant iron  deficiency, which would be indicative of a long-term issue.  Did have  EGD done, which showed minimal erosion, but did show erosiveness in the  esophagus and suggest doing PPI, also had the Givens capsule today and  we should have those results by GI hopefully by tomorrow.  We will  recheck CBC in the morning see if remained stable.   Atrial fibrillation? /supraventricular tachycardia/question of  atypical  atrial flutter.  Cardiology has started transitioning this patient to  oral Cardizem him and will need to continue to monitor.  She did have  episodes of bradycardia and with her rate going down into the 30s, but  the patient was asymptomatic.  At that time, question whether dosage was  too much.  Her blood pressure also was down, although she did still  remain asymptomatic at that time too.   Shortness of breath.  This is likely related to all the other medical  issues as mentioned above and has improved.  She did have elevated BNP  and was diuresed with Lasix and we will decrease her Lasix dose today  and recheck a BNP in the morning.   Hypothyroidism.  Dosage was too high and it is unclear when last time  that her thyroid was checked and we will start her on 100 mcg and we  will need to recheck in approximately 4 weeks.   DISPOSITION:  Given the patient's GI bleed, she was  not started on GI  prophylaxis and the fact the patient was ambulating and moving around,  Flowtrons, and compression stockings were not used.  She was ambulating  enough that she should not have any increased risk for DVT.  From a GI  prophylax standpoint given her findings of EGD, we will continue with  the Protonix and we will need to continue this is an outpatient.   Once consulted with GI and Cardiology, the patient may be able to go  home tomorrow if her blood work remain stable tomorrow morning and  resume back on normal diet.      Delphina Cahill, M.D.  Electronically Signed     ZH/MEDQ  D:  06/10/2008  T:  06/11/2008  Job:  [id number]

## 2010-11-08 NOTE — Consult Note (Signed)
[name], [name]               ACCOUNT NO.:  [id number]   MEDICAL RECORD NO.:  [id number]          PATIENT TYPE:  INP   LOCATION:  [id number]                          FACILITY:  [facility]   PHYSICIAN:  Caro Hight, M.D.      DATE OF BIRTH:  12-23-1955   DATE OF CONSULTATION:  06/08/2008  DATE OF DISCHARGE:                                 CONSULTATION   CHIEF COMPLAINT:  Iron deficiency anemia and hemoccult positive stool.   HISTORY OF PRESENT ILLNESS:  [name] is a 55 year old Caucasian  female who has recurrent iron deficiency anemia.  We have evaluated her  previously back in 2008 when she presented with hematochezia and iron  deficiency anemia.  She underwent colonoscopy by Dr. Melony Overly on 10/15/2006  and she was found to have pancolonic diverticula and external  hemorrhoids and her anemia corrected on iron.  She never had any further  problems with GI bleeding and so no further workup was obtained at that  point.  More recently, she has noticed shortness of breath.  She tells  me she was getting ready for this morning and she began to gag.  She  was also having hot flashes.  She had a presyncopal episode in the last  couple of weeks as well.  She denies any chest pain currently.  She has  had some palpitations.  She was found to have a hemoglobin of 9.5,  hematocrit 30.5, MCV 66.6.  She had a positive D-dimer at 1.43.  She had  a V/Q scan which is pending.  She has had heartburn and indigestion and  she has been taking over the counter Tums and ranitidine.  She has not  been on a PPI in quite some time.  She denies any anorexia or early  satiety, denies any nausea or vomiting.  She was previously on Naprosyn  and admits to taking ibuprofen 800 mg to 1 gram daily for the last  several months.  She denies any abdominal pain, denies any rectal  bleeding or melena.  She generally has one soft brown bowel movement  daily.  She denies any easy bruising and denies any history of  transfusions.   Past GI workup includes a HIDA scan on 03/04/2008 with a gallbladder  ejection fraction of 96.84.  She had a CT of the abdomen with contrast  in 01/2008 which showed malrotated kidneys and scoliosis.  A chest x-ray  from today shows an abnormal asymmetric interstitial density in the  right lung, pulmonary edema, and cardiac enlargement.   PAST MEDICAL AND SURGICAL HISTORY:  She has had a partial thyroidectomy  and parathyroidectomy.  She has history of hypertension, chronic  bronchitis,  hypercholesterolemia, colonoscopy as described in HPI,  diverticulosis, iron deficiency anemia, chronic renal insufficiency.  She had a right common femoral endarterectomy.  She has history of ASD  with surgery as a child.  She has a remote history of ileitis in 34.  She has a history of gout, tonsillectomy, and adenoidectomy.   MEDICATIONS PRIOR TO ADMISSION:  Synthroid 137 mcg daily, atenolol 25 mg  daily,  Lasix 40 mg daily, aspirin 81 mg daily, simvastatin, unknown dose  daily, ibuprofen 200 mg four to five daily.   ALLERGIES:  Oxycodone, Vicodin, and penicillin.   FAMILY HISTORY:  There is no known family history of colon carcinoma,  liver or chronic GI problems.  Mother deceased at 62 with history of  COPD.  Father deceased at 27 secondary to lung carcinoma.  She has one  healthy brother.   SOCIAL HISTORY:  [name] is single.  She lives next door to her  brother.  She works in an office at [employer].  She has never  had any children.  She denies any tobacco, alcohol or drug use.   REVIEW OF SYSTEMS:  See HPI, otherwise negative.   PHYSICAL EXAMINATION:  VITAL SIGNS:  O2 sat 100% on 2 liters per minute,  temperature 97.3, pulse 64, respirations 18, blood pressure 110/59.  GENERAL:  [name] is a 55 year old Caucasian female who is alert and  oriented, pleasant and cooperative, in no acute distress.  She does have  low set ears.  HEENT:  Sclerae clear and  anicteric.  Conjunctivae pink.  Oropharynx is  pink and moist without any lesions.  NECK:  Supple without any masses or thyromegaly.  HEART:  Rate is irregular/irregular with a 2/6 murmur noted.  LUNGS:  Clear to auscultation bilaterally.  ABDOMEN:  Positive bowel sounds times four.  No bruits auscultated.  Soft, nontender and nondistended without palpable masses or  hepatosplenomegaly.  No rebound tenderness or guarding.  RECTAL:  Deferred given hemoccult positive status.  EXTREMITIES:  Without edema.   LABORATORY DATA:  White blood cell count 9.4, platelets 497, calcium  9.8, sodium 141, potassium 4.3, chloride 110, CO2 22, BUN 30, creatinine  1.62, glucose 161.  CK-MB, troponin, and myoglobin negative.  Urinalysis  was positive for a small amount of blood.   IMPRESSION:  [name] is a 55 year old female with a history of iron  deficiency anemia and mild chronic renal insufficiency who was admitted  with shortness of breath, positive D-dimer, and anemia.  Will await  results from V/Q scan prior to further gastrointestinal (GI) evaluation.  Given her combination of gout medications and NSAID use, she is at high  risk for peptic ulcer disease or bleeding anywhere in her GI tract.  She  does have some poorly gastroesophageal reflux disease (GERD) symptoms at  this time and will need a PPI.  If EGD is benign, once cleared from a  cardiopulmonary standpoint, she may need a small bowel Given's capsule  study to look for small bowel bleeding/arteriovenous malformations  (AVMs).   PLAN:  1. Agree with daily PPI.  2. Await V/Q scan results and cardiology input.  3. Once cleared from a cardiopulmonary standpoint, she is going to      need an EGD.  I have discussed the      procedure including risks and benefits which include but are not      limited to bleeding, infection, perforation, and drug reaction.      She agrees to the plan and consent was obtained.  4. Would avoid NSAIDS at  this point.      Vickey Huger, N.P.      Caro Hight, M.D.  Electronically Signed    KJ/MEDQ  D:  06/08/2008  T:  06/08/2008  Job:  [id number]   cc:   Delphina Cahill, M.D.  Fax: [phone]

## 2010-11-08 NOTE — Assessment & Plan Note (Signed)
[facility] OFFICE NOTE   [name], [name]                      MRN:          [id number]  DATE:11/06/2007                            DOB:          1956-01-05    REFERRING PHYSICIAN:  [facility].   [name] is seen in the office today at the request of the free  clinic due to increased dyspnea.  She has had a recent illnesses that  sounds like bronchitis, but noted a decline in exercise tolerance prior  to that.  She can only walk 50-100 feet without stopping to rest due to  breathlessness.  She has never smoked cigarettes.  She has no history of  chronic lung conditions.  She recently had a BNP level in excess of 300.  Her chest x-ray is interpreted as showing cardiomegaly and vascular  redistribution.  To my review, it does not look terribly different than  prior films over the last 2 years.   CURRENT MEDICATIONS:  1. Atenolol 50 mg daily.  2. Levothyroxine 0.137 mg daily.  3. Aspirin 81 mg daily.  4. Prilosec 20 mg daily.  5. Furosemide 20 mg daily.  6. Simvastatin 10 mg daily.  7. Tizanidine 4 mg daily.  8. Cetirizine 10 mg daily.  9. Famotidine 10 mg daily.  10.ProAir inhaler q.8 h.   PHYSICAL EXAMINATION:  GENERAL:  A pleasant woman in no acute distress.  VITAL SIGNS:  The weight is 148, 4 pounds less than in December of last  year.  Blood pressure 105/70, heart rate 75 and regular, respirations  14.  NECK:  No jugulovenous distention; no carotid bruits.  LUNGS:  Clear.  CARDIAC:  Normal first and second heart sounds; grade 2/6 systolic  ejection murmur heard across the precordium.  ABDOMEN:  Soft and nontender; no organomegaly.  EXTREMITIES:  1+ edema on the right; 1/2+ on the left.   Pulmonary function tests were reviewed.  These showed small airway  obstructive changes with fairly normal lung volumes.   Chest x-ray was reviewed.  In addition to the above-noted  changes, there  is moderate scoliosis.   IMPRESSION:  [name] has worsening dyspnea on exertion.  Much of  this may be due to a superimposed upper respiratory infection.  Her  murmur is fairly prominent and of uncertain origin.  We will repeat an  echocardiogram to further investigate this finding, as well as to verify  that her echo remains normal, as was the reading in 2006.  She also had  a cardiac catheterization at that time that failed to show any  intracardiac shunt, significant valvular abnormalities or left-to-right  ventricular dysfunction.  I have asked [name] to increase  furosemide to 40 mg daily.  We will verify normal electrolytes and renal  function.  I will see this nice woman again in 2 weeks.     Cristopher Estimable. Lattie Haw, MD, [facility]  Electronically Signed    RMR/MedQ  DD: 11/06/2007  DT: 11/06/2007  Job #: [id number]   cc:   [facility]

## 2010-11-08 NOTE — Assessment & Plan Note (Signed)
[facility] OFFICE NOTE   [name], [name]                      MRN:          [id number]  DATE:05/20/2007                            DOB:          1955-08-10    REFERRING PHYSICIAN:  [facility].   HISTORY:  [name] has a history of congenital heart disease,  probably an ASD or VSD, that was repaired at [facility] when she  was 55 years of age.  We are seeking the records of that procedure.  She  subsequently did well with normal childhood development until  approximately two years ago when she developed dyspnea on exertion.  She  was evaluated at [facility] where a cardiac catheterization  was normal.  Attention was directed toward her right leg for unclear  reasons, and a focal stenosis of the common femoral artery found.  This  was repaired surgically by Dorothea Glassman, M.D.  I suspect this was  related to trauma, perhaps intervention when she was 55 years old, rather  than atherosclerotic disease.  Otherwise, she has been generally well.  She has had mild hypertension that has been easily controlled with  medication.  She has hypothyroidism and has undergone two prior thyroid  surgeries.  The problem now is continuing dyspnea with mild exertion.   There is no history of pulmonary problems.  The patient never worked in  an environment with substances that were potentially injurious to the  lung.  She has been said to have anemia in the past, but the most recent  CBC I have from May of this year was normal.   ALLERGIES:  PENICILLIN, HYDROCODONE, VICODIN reported.   CURRENT MEDICATIONS:  1. Atenolol 50 mg daily.  2. Levothyroxine 0.137 mg daily.  3. Aspirin 81 mg daily.   SOCIAL HISTORY:  She works in a Social worker.  Sedentary  lifestyle.  Unmarried with no children.   FAMILY HISTORY:  Father died due to carcinoma of the lung and mother due  to COPD.   One brother is alive with a history of congestive heart  failure.   REVIEW OF SYSTEMS:  Notable for the need for corrective lenses and  having been told of what sounds like early cataracts.  She has  occasional palpitations, and has been told of a murmur and has a history  of colitis and GERD.  She reports diffuse arthritic discomfort and has  occasional mild ankle edema.  All other systems reviewed and are  negative.   PHYSICAL EXAMINATION:  GENERAL:  Pleasant woman in no acute distress.  VITAL SIGNS:  Height 4 feet 10 inches, weight 153, blood pressure  120/90, heart rate 65 and regular, respirations 18.  HEENT:  Fundi could not be visualized.  Pupils equal, round, and  reactive to light.  EOM's full.  Normal oral mucosa.  NECK:  Transverse scar at the base of the neck anteriorly.  No jugular  venous distention.  Normal carotid upstrokes without bruits.  LUNGS:  Clear.  HEART:  Normal first and second heart sounds.  No fourth heart sound or  murmur appreciated.  Normal PMI.  ABDOMEN:  Soft and nontender.  Normal bowel sounds.  No masses.  No  organomegaly.  EXTREMITIES:  Trace edema.  Distal pulses intact.  NEUROMUSCULAR:  Symmetric strength and tone.  Normal cranial nerves.   EKG; normal sinus rhythm, borderline first degree AV block, Q waves  consistent with inferior infarction, anterior T wave inversion.  No  change when compared to a prior tracing of December 24, 2004.   IMPRESSION:  The patient has longstanding dyspnea on exertion.  A normal  cardiac catheterization suggests that the heart is not the problem.  She  has no history of to suggest significant lung disease.  She does not  appear to have a significant anemia.  Accordingly, excessive weight and  physical deconditioning are the likely causes of her symptoms.  Of  course, other issues need to be considered.  Since she has had an  uncertain amount of diagnostic testing with [facility] we will seek  their records before  proceeding with a complete workup.  A chest x-ray  and basic blood tests will be obtained.  I will see this nice woman  again after these preliminary steps have been completed.     Cristopher Estimable. Lattie Haw, MD, [facility]  Electronically Signed    RMR/MedQ  DD: 05/20/2007  DT: 05/21/2007  Job #: [id number]   cc:   [facility]

## 2010-11-08 NOTE — Assessment & Plan Note (Signed)
NAME:  [name], [name]                CHART#:  [id number]   DATE:  12/06/2006                       DOB:  1956/05/16   CHIEF COMPLAINT:  Followup colonoscopy.   SUBJECTIVE:  [name] is a 55 year old Caucasian female with a  history of iron deficiency anemia.  She denies any GI complaints.  She  underwent colonoscopy by Dr. Laural Golden on 10/15/2006.  She was found to  have pancolonic diverticulosis, external hemorrhoids and no lesion to  account for her iron deficiency anemia.  She was supposed to have CBC  and Hemoccults prior to the office visit today; however, she tells me  she has no returned these yet, and did not go for her lab work.  She  tells me she has  bowel movements, denies any rectal bleeding or melena,  abdominal pain, nausea or vomiting.  She has rare heartburn, and  indigestion, and takes an occasional Tums.  Denies any vaginal bleeding.  She tells me she has not had a period in 11 years.  She is on aspirin 81  mg daily as well as iron.  She denies any fever or chills.  Her weight  has remained stable.   CURRENT MEDICATIONS:  See the list dated 12/06/2006.   ALLERGIES:  PENICILLIN, VICODIN, OXYCODONE.   OBJECTIVE:  VITAL SIGNS:  Weight 148 pounds, height 58-1/2 inches, temp  98.1, blood pressure 128/88, pulse 56.  IN GENERAL:  The patient is a well-developed, well-nourished Caucasian  female in no acute distress.HEENT:  Sclera clear, nonicteric,  conjunctivae pink.  Oropharynx pink and moist without any lesions.HEART:  Regular rate and rhythm.  Normal S1, S2.  ABDOMEN:  Positive bowel  sounds x4.  No bruits auscultated, soft, nontender, nondistended.  No  palpable masses of hepatosplenomegaly.  No rebound tenderness or  guarding.EXTREMITIES:  Without clubbing or edema bilaterally.   ASSESSMENT:  The patient is a 55 year old female with iron deficiency  anemia, hemoccult status unknown.  Nothing to explain this on  colonoscopy.  She is going to return 3 hemoccult  cards, and if her  anemia does not correct with oral iron, would consider further  evaluation.  No GI symptoms at this time except for very rare heartburn  and indigestion.   PLAN:  1. Hemoccult stools x3.  2. CBC. If abnormal, she will need an EGD, followed by GIVENS capsule      study.       Vickey Huger, N.P.  Electronically Signed     Caro Hight, M.D.  Electronically Signed    KJ/MEDQ  D:  12/07/2006  T:  12/07/2006  Job:  [id number]   cc:   Leonides Grills, M.D.

## 2010-11-08 NOTE — Letter (Signed)
July 20, 2008    Delphina Cahill, M.D.  [place],  [place] [address]   RE:  SALAH, TINES  MRN:  [id number]  /  DOB:  28-Jun-1955   Dear Thedore Mins:   [name] returns to the office for continued assessment and treatment  of multiple issues including a history of ASD repair, hypertension, and  aortic valve disease.  As you know, she was recently hospitalized with  GERD, esophageal erosions and iron-deficiency anemia.  This has all  improved with discontinuation of nonsteroidals and iron replacement.  She was also evaluated by Dr. Tressie Stalker for serum protein abnormalities  that proved to be insignificant.  She has had a systolic murmur with  mild aortic stenosis by echo Doppler.  Blood pressure control has been  good.  She has had no direct problems with her ASD since it was repaired  at age 68.  She had tachy arrhythmias in the hospital whose identity was  not entirely clear.  There were supraventricular and may have  represented PSVT versus flutter.   CURRENT MEDICATIONS:  Include  1. Atenolol 50 mg daily.  2. Levothyroxine 0.1 mg daily.  3. Aspirin 81 mg daily.  4. Prilosec 20 mg daily.  5. Furosemide 40 mg daily.  6. Simvastatin 10 mg daily.  7. Diltiazem 180 mg daily.  8. KCl 10 mEq.  9. Fish oil 1000 mg b.i.d.  10.Ferrous sulfate 325 mg daily.  11.Uloric 40 mg daily.   PHYSICAL EXAMINATION:  GENERAL:  A very pleasant woman, in no acute  distress.  VITAL SIGNS:  The weight is 141, 2 pounds less than 1 month ago.  Blood  pressure 100/70, heart rate 76 and somewhat irregular, and respirations  12.  NECK:  No jugular venous distention; transmitted murmur bilaterally.  LUNGS:  Clear.  CARDIAC:  Normal first and second heart sounds; grade 2/6 basilar  systolic ejection murmur.  ABDOMEN:  Soft and nontender; no bruits; no masses; no organomegaly.  EXTREMITIES:  Dilated surface varicosities on the right with 1-2+ edema;  1/2+ edema on the left.   A carotid ultrasound  study was performed in December.  This showed a  tortuous vessels with minimal plaque and no focal stenosis.  She reports  a prior ultrasound of the lower extremities done by the vascular  surgeons with no significant abnormalities detected.  A CT scan of the  abdomen in August of 2009 was normal except for the presence of  scoliosis.   IMPRESSION:  Ms. Overton is doing well at the present time.  Most, if  not all, of her symptoms have resolved with correction of anemia.  She  does require diuretics for chronic pedal edema.  Dr. Tressie Stalker suggested  compression stockings.  I will leave it you to order this, perhaps at a  compression of 20-30 mmHg.  She is at risk for recurrent atrial  arrhythmias, but a recent Holter monitor revealed no tachycardia.  Current medications will be continued.  I suggested that she can  discontinue aspirin, especially with her history of esophageal erosions.  She had only mild hyperlipidemia when started on simvastatin.  You may  wish to discontinue that medication and reassess the need for it.  I  will plan to see this nice woman again in 6 months.    Sincerely,      Cristopher Estimable. Lattie Haw, MD, [facility]  Electronically Signed    RMR/MedQ  DD: 07/20/2008  DT: 07/21/2008  Job #: [id number]

## 2010-11-08 NOTE — Letter (Signed)
November 25, 2007    [facility] [address]  [place], [place] [address]   RE:  [name], [name]  MRN:  [id number]  /  DOB:  [birth year]   [name] returns to the office for continued assessment and treatment  of dyspnea.  Since her last visit, she has substantially improved but  still notes some shortness of breath.  She was seen in the emergency  department for pain and redness in her left knee.  This was  characterized as gout, and she was treated with colchicine with  improvement.  Her knee x-ray actually showed chondrocalcinosis, although  she has a strong family history of gout and may have had previous  spells.  Uric acid was 9.7.   Medications are unchanged from her last visit.   PHYSICAL EXAMINATION:  GENERAL:  On exam, a pleasant woman in no acute  distress.  VITAL SIGNS:  The weight is 146, 2 pounds less than in May.  Blood  pressure 120/80, heart rate 60 and regular, respirations 15.  NECK:  No jugular venous distention; no carotid bruits nor transmitted  murmur.  LUNGS:  Clear.  CARDIAC:  Normal first and second heart sounds; grade 2/6 basilar  systolic ejection murmur.  ABDOMEN:  Soft and nontender; no organomegaly.  EXTREMITIES:  Trace edema.   Echocardiogram was unchanged and showed normal left ventricular size and  function, mild aortic valvular sclerosis and mild mitral regurgitation.   IMPRESSION:  [name] appears to be doing generally well.  I  recommended weight loss and exercise for her current minor symptoms.  We  talked about the advisability of taking a medication regularly to  prevent gouty attacks.  Since she has had very infrequent spells, I  suggested she wait for the next event.  I will see this nice woman again  in 10 months.    Sincerely,      Cristopher Estimable. Lattie Haw, MD, [facility]  Electronically Signed    RMR/MedQ  DD: 11/25/2007  DT: 11/25/2007  Job #: [id number]

## 2010-11-08 NOTE — Op Note (Signed)
NAME:  [name], [name]               ACCOUNT NO.:  [id number]   MEDICAL RECORD NO.:  [id number]          PATIENT TYPE:  AMB   LOCATION:  DAY                          FACILITY:  [facility]   PHYSICIAN:  Odis Hollingshead, M.D.DATE OF BIRTH:  1955-09-15   DATE OF PROCEDURE:  01/15/2009  DATE OF DISCHARGE:  01/15/2009                               OPERATIVE REPORT   PREOPERATIVE DIAGNOSIS:  Prolapsed internal hemorrhoid right side.   POSTOPERATIVE DIAGNOSIS:  Prolapsed internal hemorrhoid right side.   PROCEDURE:  Internal external hemorrhoidectomy by way of open technique.   SURGEON:  T. Zella Richer, M.D.   ANESTHESIA:  General plus Marcaine for local anal block.   INDICATIONS:  This is a 55 year old female who has had a long history of  a prolapsing rectal mass consistent with hemorrhoids.  She does have  intermittent constipation.  She occasionally has bleeding from it.  She  now presents for hemorrhoidectomy.  We discussed the procedure, risks  and aftercare preoperatively.   TECHNIQUE:  She was brought to the operating room, placed supine on the  operating table and general anesthetic was administered.  She was then  placed in the left lithotomy position.  The perianal area was sterilely  prepped and draped.  I had reduced the hemorrhoid briefly, but then it  prolapsed back out, involving most of the right side.  It appeared to be  mostly emulating, however, from the anterior column on the right side.  Using the harmonic scalpel, I made an incision in the anoderm, anal  mucosa barrier and stripped muscle away.  Using a harmonic scalpel, I  excised the internal part and the external portion as well with care  taken to save the external sphincter muscle.  Once this was excised  using the harmonic scalpel, I passed it off the field to be sent to  Pathology.   I identified bleeding points and then controlled this was  electrocautery.  She had of a mild bit of anal stenosis  preoperatively  plus I did not want to reapproximate this to make that worse.  I  subsequently left the wound opened.  Hemostasis was adequate at this  time.   A piece of Gelfoam was then placed in the anus on top of the wound.  A  bulky dressing was applied.   She tolerated the procedure without any apparent complications and was  taken to recovery in satisfactory condition.      Odis Hollingshead, M.D.  Electronically Signed     TJR/MEDQ  D:  01/15/2009  T:  01/16/2009  Job:  [id number]   cc:   Nelwyn Salisbury, M.D.  Fax: [phone]   Vinnie Level, MD Sabra Heck

## 2010-11-08 NOTE — Procedures (Signed)
BYPASS GRAFT EVALUATION   INDICATION:  Followup right common femoral artery endarterectomy.   HISTORY:  Diabetes:  No.  Cardiac:  Coronary artery bypass graft in 1996.  Hypertension:  Yes.  Smoking:  No.  Previous Surgery:  Right common femoral endarterectomy in April of 2007  by Dr. Amedeo Plenty.   SINGLE LEVEL ARTERIAL EXAM                               RIGHT              LEFT  Brachial:                    110                118  Anterior tibial:             142                146  Posterior tibial:            150                148  Peroneal:  Ankle/brachial index:        1.27               1.25   PREVIOUS ABI:  Date:  05/10/2006  RIGHT:  >1.0  LEFT:  >1.0   LOWER EXTREMITY BYPASS GRAFT DUPLEX EXAM:   DUPLEX:  Triphasic waveforms noted throughout the right common femoral,  superficial femoral and proximal profunda femoral arteries with no  evidence of stenosis.   IMPRESSION:  1. Patent right common femoral artery endarterectomy site with normal      bilateral ABIs.  2. No change from previous exam on 05/10/2006.   ___________________________________________  P. Drucie Opitz, M.D.   CH/MEDQ  D:  10/24/2007  T:  10/24/2007  Job:  [id number]

## 2010-11-08 NOTE — Discharge Summary (Signed)
NAMECNYTHIA, Wright               ACCOUNT NO.:  [id number]   MEDICAL RECORD NO.:  [id number]          PATIENT TYPE:  INP   LOCATION:  [id number]                          FACILITY:  [facility]   PHYSICIAN:  Delphina Cahill, M.D.        DATE OF BIRTH:  1956/02/18   DATE OF ADMISSION:  06/08/2008  DATE OF DISCHARGE:  12/17/2009LH                               DISCHARGE SUMMARY   DISCHARGE DIAGNOSES:  1. Gastrointestinal bleed felt related to small bowel ulcerations.  2. Supraventricular tachycardia, question abnormal atrial flutter.  3. Anemia secondary to gastrointestinal bleed.  4. Shortness of breath.  5. Hyperthyroidism related to over medication.  6. Mild erosive gastritis.  7. Iron deficiency.  8. Chronic renal insufficiency.   DISCHARGE MEDICATIONS:  1. Synthroid 100 mcg once daily.  2. Atenolol 25 mg once daily.  3. Furosemide 40 mg once daily.  4. Simvastatin 40 mg every night.  5. Aspirin 81 mg once a day, hold for another week.  6. Protonix 40 mg once daily.  7. K-Dur 10 mEq once daily.  8. Diltiazem extended release 180 once daily.  9. Ferrous sulfate 325 mg b.i.d. with food.   BRIEF HOSPITAL COURSE:  [name] is a 55 year old who was admitted  for shortness of breath and heart arrhythmia, found to have GI bleed  with significant iron-deficiency anemia.  She was admitted for further  evaluation of this and getting cardiology and GI input to see about  causes.   PHYSICAL EXAM AT THE TIME OF DISCHARGE:  VITAL SIGNS:  Temperature 97.6,  blood pressure 125/79, pulse 71, respirations 18, sating 97% on room  air.  GENERAL:  This is a white female sitting in chair in no acute distress.  HEENT:  Unremarkable.  LUNGS:  Clear to auscultation bilaterally.  I did not appreciate any  rhonchi or wheezing.  No crackles at the bases.  Good air movement  throughout.  HEART:  Regular rate and rhythm at this time with faint possibly 1/6  systolic murmur.  ABDOMEN:  Soft, nontender.  Positive  bowel sounds.  EXTREMITIES:  No lower extremity edema.  NEUROLOGIC:  Alert and oriented x3.  No deficits noted.   LABORATORY DATA:  Laboratory data obtained during hospitalization shows  initial CBC showed a white count of 9.4, hemoglobin 9.5, platelet count  of 497, trended down to low hemoglobin of 8.0, was transfused 2 units  and at the time of discharge, hemoglobin was 11.1.  Initial BMET shows  sodium 141, potassium 4.3, chloride 110, CO2 of 22, glucose 161, BUN 30,  creatinine 1.62, calcium of 9.8.  D-dimer was 1.43 with fetal occult  positive.  TSH was 0.050, free T4 was 2.00, ferritin was 4.  Initial BNP  was 791.  BNP at time of discharge was 505.  BMET at time of discharge  shows sodium 140, potassium 3.7, chloride 111, CO2 of 22, glucose 94,  BUN 9, creatinine of 1.18, calcium of 8.9.   TESTS AND PROCEDURES:  A 2-D echo showed overall left ventricular  systolic function was normal, did have some mild-to-moderate  dilated  left atrium, right ventricle was mildly dilated with some mild  ventricular hypertrophy.  Right atrium was also mildly dilated.   Given capsule, I do not have the final results on that, but per GI, the  patient did have small erosions and small bowel indicative of NSAID use.  No other signs of gross bleeding.   Had an EGD done by Dr. Gala Romney, which revealed singular V-shaped distal  esophageal erosion consistent with erosive reflux esophagitis,  noncritical Schatzki ring, and single pyloric channel erosion.  Initial  chest x-ray revealed abnormal asymmetric interstitial densities in right  lung attributed to pulmonary edema, cardiac enlargement without  significant change.  A pulmonary V/Q scan showed low probability of  pulmonary embolus, sensitivity is mildly reduced due to technical  difficulty.  Chest x-ray day before discharge shows question of small  right pleural effusion and bibasilar atelectasis.   HOSPITAL COURSE:  1. GI bleed with  iron-deficiency anemia.  This is very likely some      chronic given low ferritin level with a slow bleed, but was seen      and assessed by GI and not have any acute signs of bleeding.  At      the time of discharge, was transfused 2 units during the      hospitalization and CBC 11.1 at the time of discharge.  MCV was      significantly low during the hospitalization of 66.  After blood      transfusion, it came up to 71.  We will continue on Protonix as      well as ferrous sulfate to help boost this up.   1. Heart arrhythmia.  It is unclear exact cause if this, was it      related to thyroid, was it related to anemia, the cause of      irritation of the heart either SVT versus atypical atrial flutter,      but was started on Cardizem drip initially and responded, was      continued on atenolol as well as at the time of discharge set out      diltiazem extended release 180 mg once daily.  Is to follow up with      Dr. Lattie Haw in the next 2-3 weeks.   1. Shortness of breath.  This appears to have resolved at this time,      but question whether it is related to diastolic heart failure.  Did      have mildly elevated BNP at 790 and came down to 500 at the time of      discharge, also may be related to chronic asthmatic bronchitis-type      picture.   1. Hypothyroidism/hyperthyroidism.  She had a significantly elevated      level of free T4 and very low TSH indicative of abnormal or too      high strength of thyroid medicine and was cut back and will need to      be followed up within another 4 weeks.   1. Bradycardia/hypotension.  When in the hospital on telemetry, she      did not notice that heart rate was slow or the blood pressure was      low, and did not have any further signs of orthostasis or syncope.   DISPOSITION:  The patient will be discharged to home.  She lives next  door to her brother and will have multiple friends checking on her.  She  will need to follow up with  Dr. Lattie Haw in 2-3 weeks and follow up with  me in approximately 4 weeks.  I do not have a specific time frame for GI  followup.      Delphina Cahill, M.D.  Electronically Signed     ZH/MEDQ  D:  06/11/2008  T:  06/12/2008  Job:  [id number]   cc:   Cristopher Estimable. Lattie Haw, MD, [address] [place], [place] [address]   R. Garfield Cornea, M.D.  [address]  [place] [address]

## 2010-11-11 NOTE — Op Note (Signed)
NAME:  [name], [name] NO.:  [id number]   MEDICAL RECORD NO.:  [id number]          PATIENT TYPE:  AMB   LOCATION:  SDS                          FACILITY:  [facility]   PHYSICIAN:  Quay Burow, M.D.   DATE OF BIRTH:  Jan 15, 1956   DATE OF PROCEDURE:  08/31/2005  DATE OF DISCHARGE:                                 OPERATIVE REPORT   PROCEDURE:  Peripheral angiogram.   [name] is a 55 year old white female with a history of ASD as a child.  She has hypertension and hyperlipidemia.  She underwent a diagnostic  catheterization via the left femoral approach by Dr. Einar Gip revealing normal  coronary arteries, normal LV function.  She was complaining of right lower  extremity claudication and Dopplers revealed a high-frequency signal in the  right common femoral artery.  She presents now for angiography and potential  intervention.   ANGIOGRAPHIC DATA:  1.  Abdominal aorta:      1.  Renal arteries - normal.      2.  Infrarenal abdominal aorta - normal.  2.  Left lower extremity:  The left iliac system was widely patent, as was      the left femoral system.  3.  Right lower extremity:  A 90-95% fairly focal right distal common      femoral artery stenosis at the inguinal flexion point.   IMPRESSION:  [name] has high-grade distal right common femoral artery  stenosis not suitable for endovascular repair because of the location of the  pathology at a flexion point.  She would optimally require endarterectomy  with patch angioplasty, which could probably be done under local anesthesia.   She sheath was removed and pressure was held on the groin to achieve  hemostasis.  The patient left the lab in stable condition.  She will be  discharged home and treated as an outpatient; we will see her back in the  office in approximately one week for follow-up.      Quay Burow, M.D.  Electronically Signed     JB/MEDQ  D:  08/31/2005  T:  08/31/2005  Job:  [id number]   cc:   Outpatient Cardiac Cath. Diagnostic Lab First [facility]   Leslye Peer, MD  Fax: [phone]   Bonne Dolores, M.D.  Fax: [phone]

## 2010-11-11 NOTE — Op Note (Signed)
[name], [name]               ACCOUNT NO.:  [id number]   MEDICAL RECORD NO.:  [id number]          PATIENT TYPE:  AMB   LOCATION:  DAY                           FACILITY:  [facility]   PHYSICIAN:  Hildred Laser, M.D.    DATE OF BIRTH:  08-10-1955   DATE OF PROCEDURE:  10/15/2006  DATE OF DISCHARGE:                               OPERATIVE REPORT   PROCEDURE:  Colonoscopy.   INDICATIONS:  Tacy is 55 year old Caucasian female who was recently  found to have iron-deficiency anemia.  She has no GI symptoms other than  occasional hematochezia felt to be secondary to hemorrhoids.  She is  still having her periods intermittently.  She is on low-dose ASA but  does not have any symptoms pertaining to upper GI tract.  She is  undergoing diagnostic colonoscopy.  Procedure and risks were reviewed  with the patient, informed consent was obtained.   Meds for conscious sedation Demerol 50 mg IV Versed 4 mg IV.   FINDINGS:  Procedure performed in endoscopy suite.  The patient's vital  signs and oxygen saturation were monitored during the procedure and  remained stable.  The patient was placed in the left lateral recumbent  and rectal examination performed.  She had small  skin tags.  Digital  exam was normal.  Pentax videoscope was placed in the rectum and  advanced under vision into sigmoid colon beyond.  Preparation was  satisfactory.  She had scattered diverticula throughout the colon.  Scope was advanced to the cecum which was identified by ileocecal valve  and appendiceal orifice.  Pictures taken for the record.  As the scope  was withdrawn colonic mucosa was carefully examined.  There were no  polyps, tumor masses or vascular abnormalities.  Rectal mucosa similarly  was normal.  Scope was retroflexed to examine anorectal junction and  small hemorrhoids noted below the dentate line.  Endoscope was  straightened and withdrawn.  The patient tolerated the procedure well.   FINAL DIAGNOSIS:  1.  Pan colonic diverticulosis.  2. External hemorrhoids.  3. No lesion found to account for the patient's iron-deficiency      anemia.   RECOMMENDATIONS:  She will resume her left ear low-dose ASA and iron  supplement as before.   She will return for office visit in eight weeks.  She will have CBC and  Hemoccults prior to that visit.  If there is evidence of occult bleeding  or H&H does not correct with iron therapy, she will need further  evaluation.      Hildred Laser, M.D.  Electronically Signed     NR/MEDQ  D:  10/15/2006  T:  10/15/2006  Job:  [id number]   cc:   Sherrilee Gilles. Gerarda Fraction, MD  Fax: [phone]

## 2010-11-11 NOTE — Op Note (Signed)
NAMELOUEEN, ELM               ACCOUNT NO.:  [id number]   MEDICAL RECORD NO.:  [id number]          PATIENT TYPE:  INP   LOCATION:  2030                         FACILITY:  [facility]   PHYSICIAN:  Dorothea Glassman, M.D.    DATE OF BIRTH:  1956/03/31   DATE OF PROCEDURE:  10/16/2005  DATE OF DISCHARGE:                                 OPERATIVE REPORT   SURGEON:  Dorothea Glassman, M.D.   ASSISTANT:  Judeth Cornfield. Scot Dock, M.D.  Darlin Coco, P.A.-C.   ANESTHETIC:  General endotracheal.   PREOPERATIVE DIAGNOSIS:  Right lower extremity claudication.   POSTOPERATIVE DIAGNOSIS:  Right lower extremity claudication.   PROCEDURE:  Right common femoral endarterectomy with Dacron patch  angioplasty.   CLINICAL NOTE:  [name] is a 55 year old female who has a history of  congenital heart disease with ASD repair. She has a scar in her right groin.  This presumably is the result of cannulation of the right femoral vessels at  the time of her congenital heart disease surgery. She developed atypical  claudication symptoms of the right leg. Workup for this verified a severe  right common carotid artery stenosis. She is brought to the operating room  at this time for repair.   DESCRIPTION OF PROCEDURE:  The patient was brought to the operating room in  stable condition. She was placed in a supine position. The right leg was  prepped and draped in a sterile fashion. General endotracheal anesthesia  induced.   An oblique skin incision was made through the skin of the right groin.  Dissection was carried through subcutaneous tissue with electrocautery.  Deep dissection carried down to expose the inguinal ligament. The common  femoral artery was exposed and the inguinal ligament encircled with vessel  loop. Distal dissection carried down along the common femoral artery. There  is an area of fibrosis and narrowing of the common femoral artery consistent  with previous surgery. The origin of  distal dissection was then carried down  to beyond the diseased segment of the common femoral artery where the vessel  was again encircled with a vessel loop.   The patient was administered 5000 units heparin intravenously. The right  femoral vessels were controlled with clamps. A longitudinal arteriotomy was  made in the common femoral artery. There was an area of high grade stenosis  with pseudointima and atherosclerosis present in the mid right common  femoral artery. Beyond this, the common femoral artery was normal in  caliber.   An endarterectomy was then carried out removing the area of atherosclerosis  and some intimal disease. A patch angioplasty of the right common femoral  artery was then carried out with a Finesse Dacron patch using running 6-0  Prolene suture. At completion of the patch angioplasty, all the vessels were  well flushed. Clamps were removed and excellent flow was present down the  right side.  Adequate hemostasis was obtained. The patient was measured 50  mg protamine intravenously.  The subcutaneous tissue was closed with running  2-0 Vicryl suture in the deep  subcutaneous layer, running 3-0  Vicryl sutures in the superficial and  subcutaneous layer.  The skin was closed with 4-0 Vicryl. Steri-Strips  applied.  The patient tolerated the procedure well. She was transferred to  the recovery room in stable condition. No apparent complications.      Dorothea Glassman, M.D.  Electronically Signed     PGH/MEDQ  D:  10/16/2005  T:  10/17/2005  Job:  [id number]   cc:   Quay Burow, M.D.  Fax: [phone]

## 2010-11-11 NOTE — Consult Note (Signed)
NAMEJULEIDY, [name]               ACCOUNT NO.:  [id number]   MEDICAL RECORD NO.:  [id number]          PATIENT TYPE:  AMB   LOCATION:                                FACILITY:  [facility]   PHYSICIAN:  Hildred Laser, M.D.    DATE OF BIRTH:  September 23, 1955   DATE OF CONSULTATION:  DATE OF DISCHARGE:                                 CONSULTATION   GI CONSULTATION:   REQUESTING PHYSICIAN:  Sherrilee Gilles. Gerarda Fraction, MD   CHIEF COMPLAINT:  Rectal bleeding.   HISTORY OF PRESENT ILLNESS:  [name] is a 55 year old female who  says she was found to be anemic 3 weeks ago.  She had been complaining  of fatigue.  She was found to have iron-deficiency anemia and was  started on iron.  She has noticed small-volume hematochezia while wiping  on toilet tissue.  This happened about 3 times, the last time being a  couple of days ago.  She denies any abdominal pain or proctalgia.  She  denies any nausea or vomiting.  She does have heartburn and indigestion  rarely, about once a month.  She takes Tums, which does seem to help.  Denies any dysphagia, odynophagia, anorexia or early satiety.  Never had  any history of blood transfusion.   PAST MEDICAL HISTORY:  1. Hypertension.  2. Left trigger finger.  3. She has had surgery for peripheral vascular disease in 2007 on her      legs.  4. She has had 2 benign tumors removed from her thyroid last year and      has subsequent hypothyroidism.   CURRENT MEDICATIONS:  1. Atenolol 50 mg daily.  2. Synthroid 137 mcg daily.  3. Aspirin 81 mg daily.  4. Iron once daily.   ALLERGIES:  PENICILLIN, VICODIN, OXYCODONE.   FAMILY HISTORY:  No known family history of colorectal carcinoma or  liver or chronic GI problems.   SOCIAL HISTORY:  Mr. [name] word lives with a brother.  She is single.  She works at [employer].  She denies any tobacco, alcohol or drug  use.   REVIEW OF SYSTEMS:  CONSTITUTIONAL:  Weight is stable.  Is having some  fatigue.  Denies any chest  pain or palpitation, __________, shortness of  breath, dyspnea, cough, hemoptysis.  GI:  See HPI.   PHYSICAL EXAMINATION:  VITAL SIGNS:  Weight 152 pounds, 58-1/2 inches,  temperature 97.1, blood pressure 130/98, pulse 60.  GENERAL:  [name] is a well-nourished Caucasian female who is alert  and oriented and pleasant and  cooperative in no acute distress.  HEENT.  Sclerae are clear, nonicteric.  Conjunctivae pink.  Oropharynx  pink and moist without any lesions.  NECK:  Supple without thyromegaly.  CHEST:  Heart regular rate and rhythm.  Normal S1,S2, with no murmurs,  clicks, rubs or gallops.  Lungs clear to auscultation bilaterally.  ABDOMEN:  Positive bowel sounds x4.  No bruits auscultated.  Soft,  nontender, nondistended, without palpable mass or hepatosplenomegaly.  No rebound tenderness or guarding.  RECTAL:  Deferred.  EXTREMITIES:  Without clubbing or edema  bilaterally.  SKIN:  Pink, warm and dry without any rash or jaundice.   IMPRESSION:  [name] is a 56 year old female with history of iron-  deficiency anemia, who has had intermittent small-volume hematochezia.  She.  She is going to need further evaluation with complete colonoscopy  to rule out colorectal carcinoma at further determine the etiology of  her hematochezia.   Differentials include benign anorectal source such as hemorrhoids and  colorectal carcinoma.   PLAN:  1. We will request recent labs from Dr. Nolon Rod office.  2. Colonoscopy with Dr. Laural Golden in the near future.  I have discussed      the procedure including the risks and benefits, which include but      are not limited to bleeding, infection, perforation and drug      reaction.  She agrees and a signed consent will be obtained.  She      is to hold her aspirin for 4 days prior to the procedure, hold her      iron for 7 days prior to the procedure.   I would like to thank Dr. Gerarda Fraction for allowing [place] to participate in the  care of Ms.  [name].      Les Pou, N.P.      Hildred Laser, M.D.  Electronically Signed    KC/MEDQ  D:  10/01/2006  T:  10/02/2006  Job:  [id number]   cc:   Sherrilee Gilles. Gerarda Fraction, MD  Fax: [phone]

## 2010-11-22 ENCOUNTER — Encounter (INDEPENDENT_AMBULATORY_CARE_PROVIDER_SITE_OTHER): Payer: Medicare Other

## 2010-11-22 ENCOUNTER — Encounter (INDEPENDENT_AMBULATORY_CARE_PROVIDER_SITE_OTHER): Payer: Medicare Other | Admitting: Vascular Surgery

## 2010-11-22 DIAGNOSIS — I83893 Varicose veins of bilateral lower extremities with other complications: Secondary | ICD-10-CM

## 2010-11-22 NOTE — Consult Note (Signed)
NEW PATIENT CONSULTATION  [name], [name] DOB:  [birth year]                                       11/22/2010 [id number]  Patient presents today for evaluation of right leg venous pathology. She had undergone a prior right femoral endarterectomy by Dr. Drucie Opitz in April 2007.  She had arterial insufficiency at that time and has had complete resolution of her right leg claudication symptoms.  She now presents with concerns of progressive changes of venous hypertension and pain associated with this.  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  She also has changes of venous hypertension with hemosiderin deposits in the medial aspect of her right ankle above the medial malleolus.  She reports that she does have discomfort with prolonged standing.  She has a stinging and burning sensation over the varicosities and also over her medial ankle.  She does have some swelling in her right foot and ankle as well.  No history of DVT.  She has worn occasional knee-high compression with no significant improvement.  She underwent noninvasive vascular laboratory studies in our office. This reveals reflux in a large anterior branch of her great saphenous vein that extends into these varicosities.  Her great saphenous vein itself is a normal caliber without reflux.  I discussed the significance of this with patient.  I have recommended that we proceed with thigh-high compression since this is the pathologic entity causing the venous hypertension.  We have fitted her with thigh- high graduated compression garments and plan to see her again in 3 months for continued discussion.  She would be a candidate for ablation of her anterior saphenous branch and phlebectomy of her tributary varicosities.  We will discuss this with her further on her next visit.    Rosetta Posner, M.D. Electronically Signed  TFE/MEDQ  D:  11/22/2010  T:   11/22/2010  Job:  [id number]

## 2010-11-30 NOTE — Procedures (Unsigned)
LOWER EXTREMITY VENOUS REFLUX EXAM  INDICATION:  Right lower extremity venous stasis changes.  EXAM:  Using color-flow imaging and pulse Doppler spectral analysis, the right common femoral, femoral, popliteal, posterior tibial, great and small saphenous veins were evaluated.  There is evidence suggesting deep venous insufficiency in the right lower extremity.  The right saphenofemoral junction is not competent with reflux of >500 milliseconds.  The right GSV is not competent with reflux of >500 milliseconds with the caliber as described below.  The right proximal small saphenous vein demonstrates competency.  GSV Diameter (used if found to be incompetent only)                                           Right    Left Proximal Greater Saphenous Vein           0.88 cm  cm Proximal-to-mid-thigh                     0.38 cm  cm Mid thigh                                 0.52 cm  cm Mid-distal thigh                          cm       cm Distal thigh                              0.52 cm  cm Knee                                      0.36 cm  cm  IMPRESSION: 1. The right great saphenous vein is not competent with reflux of >500     milliseconds. 2. The right great saphenous vein is tortuous. 3. The deep venous system is not competent with reflux of >500     milliseconds. 4. The right small saphenous vein is competent. 5. The right anterior branch is not competent with reflux of >500     milliseconds and diameter ranges from 0.22 cm to 0.69 cm.  ___________________________________________ Rosetta Posner, M.D.  SH/MEDQ  D:  11/22/2010  T:  11/22/2010  Job:  [id number]

## 2011-02-09 ENCOUNTER — Encounter: Payer: Self-pay | Admitting: Vascular Surgery

## 2011-02-21 ENCOUNTER — Encounter: Payer: Self-pay | Admitting: Vascular Surgery

## 2011-02-22 ENCOUNTER — Encounter: Payer: Self-pay | Admitting: Vascular Surgery

## 2011-02-22 ENCOUNTER — Ambulatory Visit (INDEPENDENT_AMBULATORY_CARE_PROVIDER_SITE_OTHER): Payer: Medicare Other | Admitting: Vascular Surgery

## 2011-02-22 VITALS — BP 134/76 | HR 88 | Resp 16 | Ht <= 58 in | Wt 148.0 lb

## 2011-02-22 DIAGNOSIS — I83893 Varicose veins of bilateral lower extremities with other complications: Secondary | ICD-10-CM

## 2011-02-22 NOTE — Progress Notes (Signed)
Problems with Activities of Daily Living Secondary to Leg Pain  1.[name] states she has difficulty with tasks that require bending and squatting such as gardening and housecleaning due to leg pain.   2. [name] says has trouble with prolonged sitting especially when traveling in the car due to leg pain.     Failure of  Conservative Therapy:  1. Worn 20-30 mm Hg thigh high compression hose >3 months with no relief of symptoms.  2. Frequently elevates legs-no relief of symptoms  3. Taken Ibuprofen 600 Mg TID with no relief of symptoms.  The patient presents today for continued evaluation of right leg venous hypertension. She has been extremely compliant with her graduated compression garments. She continues to have discomfort after prolonged standing with aching around her ankle on the right. Past Medical History  Diagnosis Date  . Microscopic colitis 9/11    Colonoscopy  . Hemorrhoids   . Diverticulosis   . Schatzki's ring     Last EGD with esophageal dilatation 89F  9/11  . Congenital heart disease   . Paroxysmal SVT (supraventricular tachycardia)   . IDA (iron deficiency anemia)   . GERD (gastroesophageal reflux disease)   . Hypothyroidism   . Hypertension   . Hyperlipidemia   . Renal insufficiency   . Gout   . PVD (peripheral vascular disease)   . Hyperparathyroidism   . Scoliosis   . Chronic bronchitis   . Varicose veins right leg pain and swelling    History  Substance Use Topics  . Smoking status: Never Smoker   . Smokeless tobacco: Never Used  . Alcohol Use: No    Family History  Problem Relation Age of Onset  . Stroke Mother   . Lung cancer Father     Allergies  Allergen Reactions  . Hydrocodone-Acetaminophen   . Oxycodone Hcl   . Penicillins     Current outpatient prescriptions:allopurinol (ZYLOPRIM) 100 MG tablet, Take 1 tablet by mouth Twice daily., Disp: , Rfl: ;  atenolol (TENORMIN) 50 MG tablet, Take 1 tablet by mouth daily., Disp: ,  Rfl: ;  calcium carbonate (OS-CAL) 600 MG TABS, Take 600 mg by mouth 2 (two) times daily with a meal.  , Disp: , Rfl: ;  clobetasol (TEMOVATE) 0.05 % cream, Apply 1 application topically Three times a day., Disp: , Rfl:  COLCRYS 0.6 MG tablet, Take 1 tablet by mouth Twice daily., Disp: , Rfl: ;  diltiazem (CARDIZEM CD) 180 MG 24 hr capsule, Take 1 tablet by mouth daily., Disp: , Rfl: ;  fenofibrate 160 MG tablet, Take 1 tablet by mouth daily., Disp: , Rfl: ;  ferrous sulfate 325 (65 FE) MG tablet, Take 325 mg by mouth daily with breakfast.  , Disp: , Rfl: ;  fish oil-omega-3 fatty acids 1000 MG capsule, Take 1 g by mouth 2 (two) times daily.  , Disp: , Rfl:  furosemide (LASIX) 40 MG tablet, Take 40 mg by mouth daily.  , Disp: , Rfl: ;  KLOR-CON M10 10 MEQ tablet, Take 1 tablet by mouth daily., Disp: , Rfl: ;  levothyroxine (SYNTHROID, LEVOTHROID) 100 MCG tablet, Take 1 tablet by mouth daily., Disp: , Rfl: ;  omeprazole (PRILOSEC) 20 MG capsule, Take 20 mg by mouth 2 (two) times daily.  , Disp: , Rfl: ;  Probiotic Product (ALIGN PO), Take 4 mg by mouth.  , Disp: , Rfl:  simvastatin (ZOCOR) 20 MG tablet, Take 20 mg by mouth at bedtime.  , Disp: ,  Rfl: ;  sitaGLIPtin (JANUVIA) 50 MG tablet, Take 50 mg by mouth daily.  , Disp: , Rfl: ;  VENTOLIN HFA 108 (90 BASE) MCG/ACT inhaler, Take 1 puff by mouth Every 4 hours as needed., Disp: , Rfl: ;  vitamin E 400 UNIT capsule, Take 400 Units by mouth 2 (two) times daily.  , Disp: , Rfl:  zolpidem (AMBIEN) 10 MG tablet, Take 1 tablet by mouth At bedtime as needed., Disp: , Rfl:   BP 134/76  Pulse 88  Resp 16  Ht 4\' 10"  (1.473 m)  Wt 148 lb (67.132 kg)  BMI 30.93 kg/m2  Body mass index is 30.93 kg/(m^2).        Physical exam: Marked changes of venous hypertension in her right medial ankle with hemosiderin deposit and thickening of her skin.  I reimage her right leg with sono site. This shows reflux in her right great saphenous vein in her thigh and also  reflux and an enlarged anterior branch of her great saphenous vein in the thigh.  Venous duplex from 11/22/2010: Reflux in her right great saphenous vein and right anterior branch.  Impression: Venous hypertension related to reflux and right great saphenous and anterior saphenous branch. The patient has failed conservative therapy.  Plan: Laser ablation of right great saphenous vein and right anterior saphenous vein branch.

## 2011-02-23 NOTE — Progress Notes (Signed)
Addended by: Norberto Sorenson D on: 02/23/2011 04:50 PM   Modules accepted: Orders

## 2011-03-07 ENCOUNTER — Other Ambulatory Visit: Payer: Self-pay | Admitting: *Deleted

## 2011-03-07 DIAGNOSIS — I83893 Varicose veins of bilateral lower extremities with other complications: Secondary | ICD-10-CM

## 2011-03-31 LAB — DIFFERENTIAL
Basophils Absolute: 0.1 10*3/uL (ref 0.0–0.1)
Basophils Absolute: 0.1 10*3/uL (ref 0.0–0.1)
Basophils Absolute: 0.1 K/uL (ref 0.0–0.1)
Basophils Relative: 1 % (ref 0–1)
Basophils Relative: 2 % — ABNORMAL HIGH (ref 0–1)
Basophils Relative: 2 % — ABNORMAL HIGH (ref 0–1)
Eosinophils Absolute: 0.2 10*3/uL (ref 0.0–0.7)
Eosinophils Absolute: 0.3 10*3/uL (ref 0.0–0.7)
Eosinophils Absolute: 0.3 K/uL (ref 0.0–0.7)
Eosinophils Relative: 3 % (ref 0–5)
Eosinophils Relative: 3 % (ref 0–5)
Lymphocytes Relative: 33 % (ref 12–46)
Lymphocytes Relative: 34 % (ref 12–46)
Lymphocytes Relative: 42 % (ref 12–46)
Lymphs Abs: 2.4 10*3/uL (ref 0.7–4.0)
Lymphs Abs: 3 10*3/uL (ref 0.7–4.0)
Lymphs Abs: 3.1 10*3/uL (ref 0.7–4.0)
Lymphs Abs: 3.1 K/uL (ref 0.7–4.0)
Monocytes Absolute: 0.8 10*3/uL (ref 0.1–1.0)
Monocytes Absolute: 0.9 10*3/uL (ref 0.1–1.0)
Monocytes Absolute: 0.9 K/uL (ref 0.1–1.0)
Monocytes Relative: 10 % (ref 3–12)
Monocytes Relative: 10 % (ref 3–12)
Monocytes Relative: 11 % (ref 3–12)
Monocytes Relative: 8 % (ref 3–12)
Neutro Abs: 4.7 K/uL (ref 1.7–7.7)
Neutro Abs: 4.9 10*3/uL (ref 1.7–7.7)
Neutro Abs: 5.9 10*3/uL (ref 1.7–7.7)
Neutrophils Relative %: 44 % (ref 43–77)
Neutrophils Relative %: 51 % (ref 43–77)

## 2011-03-31 LAB — CBC
HCT: 25.7 % — ABNORMAL LOW (ref 36.0–46.0)
HCT: 34.1 % — ABNORMAL LOW (ref 36.0–46.0)
HCT: 36.4 % (ref 36.0–46.0)
Hemoglobin: 11.1 g/dL — ABNORMAL LOW (ref 12.0–15.0)
Hemoglobin: 11.9 g/dL — ABNORMAL LOW (ref 12.0–15.0)
Hemoglobin: 13 g/dL (ref 12.0–15.0)
Hemoglobin: 9.5 g/dL — ABNORMAL LOW (ref 12.0–15.0)
MCHC: 31.3 g/dL (ref 30.0–36.0)
MCHC: 32.4 g/dL (ref 30.0–36.0)
MCHC: 32.6 g/dL (ref 30.0–36.0)
MCV: 66.4 fL — ABNORMAL LOW (ref 78.0–100.0)
MCV: 71.3 fL — ABNORMAL LOW (ref 78.0–100.0)
MCV: 71.7 fL — ABNORMAL LOW (ref 78.0–100.0)
Platelets: 243 10*3/uL (ref 150–400)
Platelets: 351 K/uL (ref 150–400)
Platelets: 408 10*3/uL — ABNORMAL HIGH (ref 150–400)
RBC: 3.87 MIL/uL (ref 3.87–5.11)
RBC: 4.58 MIL/uL (ref 3.87–5.11)
RBC: 4.75 MIL/uL (ref 3.87–5.11)
RBC: 5.1 MIL/uL (ref 3.87–5.11)
RDW: 23.9 % — ABNORMAL HIGH (ref 11.5–15.5)
RDW: 24 % — ABNORMAL HIGH (ref 11.5–15.5)
RDW: 27.7 % — ABNORMAL HIGH (ref 11.5–15.5)
WBC: 7.4 10*3/uL (ref 4.0–10.5)
WBC: 9.1 K/uL (ref 4.0–10.5)
WBC: 9.3 10*3/uL (ref 4.0–10.5)

## 2011-03-31 LAB — BASIC METABOLIC PANEL
CO2: 22 mEq/L (ref 19–32)
CO2: 22 mEq/L (ref 19–32)
Calcium: 8.9 mg/dL (ref 8.4–10.5)
Calcium: 9.8 mg/dL (ref 8.4–10.5)
Chloride: 110 mEq/L (ref 96–112)
Chloride: 110 mEq/L (ref 96–112)
Chloride: 111 mEq/L (ref 96–112)
Creatinine, Ser: 1.21 mg/dL — ABNORMAL HIGH (ref 0.4–1.2)
GFR calc Af Amer: 40 mL/min — ABNORMAL LOW (ref >60)
GFR calc Af Amer: 57 mL/min — ABNORMAL LOW (ref >60)
GFR calc Af Amer: 59 mL/min — ABNORMAL LOW (ref >60)
GFR calc non Af Amer: 47 mL/min — ABNORMAL LOW (ref >60)
Glucose, Bld: 105 mg/dL — ABNORMAL HIGH (ref 70–99)
Potassium: 3.9 mEq/L (ref 3.5–5.1)
Sodium: 140 mEq/L (ref 135–145)
Sodium: 141 mEq/L (ref 135–145)

## 2011-03-31 LAB — T4, FREE: Free T4: 2 ng/dL — ABNORMAL HIGH (ref 0.89–1.80)

## 2011-03-31 LAB — PROTEIN ELECTROPH W RFLX QUANT IMMUNOGLOBULINS
Albumin ELP: 51.7 % — ABNORMAL LOW (ref 55.8–66.1)
Alpha-1-Globulin: 5 % — ABNORMAL HIGH (ref 2.9–4.9)
Alpha-2-Globulin: 14.6 % — ABNORMAL HIGH (ref 7.1–11.8)
Beta 2: 6.5 % (ref 3.2–6.5)
Beta Globulin: 7.7 % — ABNORMAL HIGH (ref 4.7–7.2)
Gamma Globulin: 14.5 % (ref 11.1–18.8)
M-Spike, %: NOT DETECTED g/dL
Total Protein ELP: 7.7 g/dL (ref 6.0–8.3)

## 2011-03-31 LAB — BASIC METABOLIC PANEL WITH GFR
BUN: 9 mg/dL (ref 6–23)
CO2: 22 meq/L (ref 19–32)
Calcium: 8.9 mg/dL (ref 8.4–10.5)
Chloride: 111 meq/L (ref 96–112)
Creatinine, Ser: 1.18 mg/dL (ref 0.4–1.2)
GFR calc Af Amer: 58 mL/min — ABNORMAL LOW (ref >60)
GFR calc non Af Amer: 48 mL/min — ABNORMAL LOW (ref >60)
Glucose, Bld: 94 mg/dL (ref 70–99)
Potassium: 3.7 meq/L (ref 3.5–5.1)
Sodium: 140 meq/L (ref 135–145)

## 2011-03-31 LAB — CARDIAC PANEL(CRET KIN+CKTOT+MB+TROPI)
CK, MB: 0.9 ng/mL (ref 0.3–4.0)
CK, MB: 1.5 ng/mL (ref 0.3–4.0)
Relative Index: INVALID (ref 0.0–2.5)
Total CK: 31 U/L (ref 7–177)
Troponin I: 0.02 ng/mL (ref 0.00–0.06)

## 2011-03-31 LAB — CROSSMATCH

## 2011-03-31 LAB — URINALYSIS, ROUTINE W REFLEX MICROSCOPIC
Bilirubin Urine: NEGATIVE
Nitrite: NEGATIVE
Specific Gravity, Urine: 1.005 (ref 1.005–1.030)
pH: 5 (ref 5.0–8.0)

## 2011-03-31 LAB — B-NATRIURETIC PEPTIDE (CONVERTED LAB): Pro B Natriuretic peptide (BNP): 505 pg/mL — ABNORMAL HIGH (ref 0.0–100.0)

## 2011-03-31 LAB — OCCULT BLOOD X 1 CARD TO LAB, STOOL: Fecal Occult Bld: POSITIVE

## 2011-03-31 LAB — ABO/RH: ABO/RH(D): A POS

## 2011-03-31 LAB — TSH: TSH: 0.05 u[IU]/mL — ABNORMAL LOW (ref 0.350–4.500)

## 2011-03-31 LAB — POCT CARDIAC MARKERS
CKMB, poc: 1.3 ng/mL (ref 1.0–8.0)
Myoglobin, poc: 142 ng/mL (ref 12–200)
Troponin i, poc: <0.05 ng/mL (ref 0.00–0.09)

## 2011-03-31 LAB — FERRITIN: Ferritin: 34 ng/mL (ref 10–291)

## 2011-04-20 ENCOUNTER — Other Ambulatory Visit: Payer: Medicare Other | Admitting: Vascular Surgery

## 2011-04-27 ENCOUNTER — Ambulatory Visit: Payer: Medicare Other | Admitting: Vascular Surgery

## 2011-04-27 ENCOUNTER — Other Ambulatory Visit: Payer: Medicare Other

## 2011-05-03 ENCOUNTER — Ambulatory Visit: Payer: Medicare Other | Admitting: Urgent Care

## 2011-05-08 ENCOUNTER — Encounter: Payer: Self-pay | Admitting: Internal Medicine

## 2011-05-09 ENCOUNTER — Encounter: Payer: Self-pay | Admitting: Urgent Care

## 2011-05-09 ENCOUNTER — Ambulatory Visit (INDEPENDENT_AMBULATORY_CARE_PROVIDER_SITE_OTHER): Payer: Medicare Other | Admitting: Urgent Care

## 2011-05-09 DIAGNOSIS — M359 Systemic involvement of connective tissue, unspecified: Secondary | ICD-10-CM

## 2011-05-09 DIAGNOSIS — R110 Nausea: Secondary | ICD-10-CM

## 2011-05-09 DIAGNOSIS — K219 Gastro-esophageal reflux disease without esophagitis: Secondary | ICD-10-CM

## 2011-05-09 NOTE — Assessment & Plan Note (Signed)
Resolved

## 2011-05-09 NOTE — Assessment & Plan Note (Addendum)
Doing well. Continue Align daily

## 2011-05-09 NOTE — Patient Instructions (Signed)
Continue Align daily Continue omeprazole 20 mg twice a day Continue Lactaid as needed If any problems, otherwise office visit with Dr. Gala Romney in one year.

## 2011-05-09 NOTE — Assessment & Plan Note (Signed)
Well controlled on omeprazole 20 mg twice a day

## 2011-05-09 NOTE — Progress Notes (Signed)
Primary Care Physician:  Wende Neighbors, MD Primary Gastroenterologist:  Dr. Gala Romney  Chief Complaint  Patient presents with  . Follow-up    Collagenous colitis, abdominal pain, GERD, nausea , Schatzki's ring    HPI:  [name] is a 55 y.o. female here for follow up for microscopic colitis, abd pain, GERD & nausea. Hx of chronic nausea which has resolved. Also history of GERD well controlled on omeprazole 20mg  bid. Hx Schatzki's ring.  Has been having a BM first thing in AM . Occasional cramp right side, better after BM. ALIGN daily really seems to help. Taking Lactaid w/ dairy. On diabetic diet. Lost a few #s. Less junk food. Occasional loose stools. Rare heartburn breakthrough. Takes omeprazole 20mg  BID. Varicose vein surgery Dec 2012.    GES normal Celiac AB panel negative.    Past Medical History  Diagnosis Date  . Microscopic colitis 9/11    Colonoscopy  . Hemorrhoids   . Diverticulosis   . Schatzki's ring     Last EGD with esophageal dilatation 60F  9/11  . Congenital heart disease   . Paroxysmal SVT (supraventricular tachycardia)   . IDA (iron deficiency anemia)   . GERD (gastroesophageal reflux disease)   . Hypothyroidism   . Hypertension   . Hyperlipidemia   . Renal insufficiency   . Gout   . PVD (peripheral vascular disease)   . Hyperparathyroidism   . Scoliosis   . Chronic bronchitis   . Varicose veins right leg pain and swelling    Past Surgical History  Procedure Date  . Asd repair Age [facility]  . Right common femoral endarterectomy 2007  . Left parathyroidectomy 2007  . Parathyroid adenoma 2007  . Left hemithyroidectomy   . Tonsillectomy   . Hemorrhoid surgery 2010  . Colonoscopy 03/04/2010    anal canal hemorrhoids otherwise normal  . Esophagogastroduodenoscopy 03/04/2010    noncritical appearing Schatzki ring    Current Outpatient Prescriptions  Medication Sig Dispense Refill  . allopurinol (ZYLOPRIM) 100 MG  tablet Take 1 tablet by mouth Twice daily.      Marland Kitchen atenolol (TENORMIN) 50 MG tablet Take 1 tablet by mouth daily.      . calcium carbonate (OS-CAL) 600 MG TABS Take 600 mg by mouth 2 (two) times daily with a meal.        . clobetasol (TEMOVATE) 0.05 % cream Apply 1 application topically Three times a day.      Marland Kitchen COLCRYS 0.6 MG tablet Take 1 tablet by mouth Twice daily.      Marland Kitchen diltiazem (CARDIZEM CD) 180 MG 24 hr capsule Take 1 tablet by mouth daily.      . fenofibrate 160 MG tablet Take 1 tablet by mouth daily.      . ferrous sulfate 325 (65 FE) MG tablet Take 325 mg by mouth daily with breakfast.        . fish oil-omega-3 fatty acids 1000 MG capsule Take 1 g by mouth 2 (two) times daily.        . furosemide (LASIX) 40 MG tablet Take 40 mg by mouth daily.        Marland Kitchen KLOR-CON M10 10 MEQ tablet Take 1 tablet by mouth daily.      Marland Kitchen levothyroxine (SYNTHROID, LEVOTHROID) 100 MCG tablet Take 1 tablet by mouth daily.      Marland Kitchen omeprazole (PRILOSEC) 20 MG capsule Take 20 mg by mouth 2 (two) times daily.        Marland Kitchen  Probiotic Product (ALIGN PO) Take 4 mg by mouth.        . simvastatin (ZOCOR) 20 MG tablet Take 20 mg by mouth at bedtime.        . sitaGLIPtin (JANUVIA) 50 MG tablet Take 50 mg by mouth daily.        . VENTOLIN HFA 108 (90 BASE) MCG/ACT inhaler Take 1 puff by mouth Every 4 hours as needed.      . vitamin E 400 UNIT capsule Take 400 Units by mouth 2 (two) times daily.        Marland Kitchen zolpidem (AMBIEN) 10 MG tablet Take 1 tablet by mouth At bedtime as needed.        Allergies as of 05/09/2011 - Review Complete 05/09/2011  Allergen Reaction Noted  . Hydrocodone-acetaminophen    . Oxycodone hcl    . Penicillins     Family History:  There is no known family history of colorectal carcinoma , liver disease, or inflammatory bowel disease.  Review of Systems: Gen: Denies any fever, chills, sweats, anorexia, fatigue, weakness, malaise, weight loss, and sleep disorder CV: Denies chest pain, angina,  palpitations, syncope, orthopnea, PND, peripheral edema, and claudication. Resp: Denies dyspnea at rest, dyspnea with exercise, cough, sputum, wheezing, coughing up blood, and pleurisy. GI: Denies vomiting blood, jaundice, and fecal incontinence.   Denies dysphagia or odynophagia. Derm: Denies rash, itching, dry skin, hives, moles, warts, or unhealing ulcers.  Psych: Denies depression, anxiety, memory loss, suicidal ideation, hallucinations, paranoia, and confusion. Heme: Denies bruising, bleeding, and enlarged lymph nodes.  Physical Exam: BP 113/76  Pulse 57  Temp(Src) 97.8 F (36.6 C) (Temporal)  Ht 4\' 10"  (1.473 m)  Wt 148 lb 9.6 oz (67.405 kg)  BMI 31.06 kg/m2 General:   Alert,  Well-developed, well-nourished, pleasant and cooperative in NAD Head:  Normocephalic and atraumatic. Eyes:  Sclera clear, no icterus.   Conjunctiva pink. Mouth:  No deformity or lesions, dentition normal. Neck:  Supple; no masses or thyromegaly. Heart:  Regular rate and rhythm; no murmurs, clicks, rubs,  or gallops. Abdomen:  Soft, nontender and nondistended. No masses, hepatosplenomegaly or hernias noted. Normal bowel sounds, without guarding, and without rebound.   Msk:  Symmetrical without gross deformities. Normal posture. Pulses:  Normal pulses noted. Extremities:  Without clubbing or edema. Neurologic:  Alert and  oriented x4;  grossly normal neurologically. Skin:  Intact without significant lesions or rashes. Cervical Nodes:  No significant cervical adenopathy. Psych:  Alert and cooperative. Normal mood and affect.

## 2011-05-10 NOTE — Progress Notes (Signed)
Cc to PCP 

## 2011-05-31 ENCOUNTER — Encounter: Payer: Self-pay | Admitting: Vascular Surgery

## 2011-06-01 ENCOUNTER — Ambulatory Visit (INDEPENDENT_AMBULATORY_CARE_PROVIDER_SITE_OTHER): Payer: Medicare Other | Admitting: Vascular Surgery

## 2011-06-01 ENCOUNTER — Encounter: Payer: Self-pay | Admitting: Vascular Surgery

## 2011-06-01 VITALS — BP 125/76 | HR 88 | Resp 16 | Ht 59.0 in | Wt 148.0 lb

## 2011-06-01 DIAGNOSIS — I83893 Varicose veins of bilateral lower extremities with other complications: Secondary | ICD-10-CM

## 2011-06-01 HISTORY — PX: OTHER SURGICAL HISTORY: SHX169

## 2011-06-01 NOTE — Progress Notes (Signed)
Laser Ablation Procedure      Date: 06/01/2011    [name] DOB:02/01/1956  Consent signed: Yes  Surgeon:T.F. Alga Southall  Procedure: Laser Ablation: right Greater Saphenous Vein (anterior branch)  BP 125/76  Pulse 88  Resp 16  Ht 4\' 11"  (1.499 m)  Wt 148 lb (67.132 kg)  BMI 29.89 kg/m2  Start time: 8:40AM   End time: 9:30AM  Tumescent Anesthesia: 250 cc 0.9% NaCl with 50 cc Lidocaine HCL with 1% Epi and 15 cc 8.4% NaHCO3  Local Anesthesia: 1 cc Lidocaine HCL and NaHCO3 (ratio 2:1)  Continuous Mode: 15 Watts Total Energy 1268 Joules Total Time1:24       Patient tolerated procedure well: Yes  Rankin, Meliton Rattan Description of Procedure:  After marking the course of the saphenous vein and the secondary varicosities in the standing position, the patient was placed on the operating table in the supine position, and the right leg was prepped and draped in sterile fashion. Local anesthetic was administered, and under ultrasound guidance the saphenous vein was accessed with a micro needle and guide wire; then the micro puncture sheath was placed. A guide wire was inserted to the saphenofemoral junction, followed by a 5 french sheath.  The position of the sheath and then the laser fiber below the junction was confirmed using the ultrasound and visualization of the aiming beam.  Tumescent anesthesia was administered along the course of the saphenous vein using ultrasound guidance. Protective laser glasses were placed on the patient, and the laser was fired at at 15 watt continuous mode.  For a total of 1268 joules.  A steri strip was applied to the puncture site.   ABD pads and thigh high compression stockings were applied.  Ace wrap bandages were applied  at the top of the saphenofemoral junction.  Blood loss was less than 15 cc.  The patient ambulated out of the operating room having tolerated the procedure well.  The patient underwent uneventful ablation her in great saphenous  vein from thigh to saphenofemoral junction was seen again in one week followup

## 2011-06-02 ENCOUNTER — Encounter: Payer: Self-pay | Admitting: Vascular Surgery

## 2011-06-02 DIAGNOSIS — I83893 Varicose veins of bilateral lower extremities with other complications: Secondary | ICD-10-CM | POA: Insufficient documentation

## 2011-06-06 ENCOUNTER — Telehealth: Payer: Self-pay | Admitting: *Deleted

## 2011-06-06 NOTE — Telephone Encounter (Signed)
06/06/2011  Time: 9:06 AM   Patient Name: [name]  Patient of: T.F. Early  Procedure:Laser Ablation right     05-02-2011  Reached patient at home and checked  Her status  Yes    Comments/Actions Taken: [name] has no complaints of swelling or bleeding.  She is complaining of moderate pain around IV insertion site (right inner thigh near knee).  Encouraged [name] to use ice compress to painful site and use Tylenol prn pain. [name] is not able to use Ibuprofen secondary to kidney issues.  Requested that [name] call [facility] if she continues to experience pain or if she has questions.      @SIGNATURE @Dianne Whelchel , Meliton Rattan

## 2011-06-07 ENCOUNTER — Encounter: Payer: Self-pay | Admitting: Vascular Surgery

## 2011-06-08 ENCOUNTER — Ambulatory Visit (INDEPENDENT_AMBULATORY_CARE_PROVIDER_SITE_OTHER): Payer: Medicare Other | Admitting: Vascular Surgery

## 2011-06-08 ENCOUNTER — Encounter: Payer: Self-pay | Admitting: Vascular Surgery

## 2011-06-08 ENCOUNTER — Other Ambulatory Visit (INDEPENDENT_AMBULATORY_CARE_PROVIDER_SITE_OTHER): Payer: Medicare Other | Admitting: *Deleted

## 2011-06-08 VITALS — BP 128/74 | HR 60 | Resp 16 | Ht 58.5 in | Wt 148.0 lb

## 2011-06-08 DIAGNOSIS — I83893 Varicose veins of bilateral lower extremities with other complications: Secondary | ICD-10-CM

## 2011-06-08 NOTE — Progress Notes (Signed)
The patient presents today for one week followup of laser ablation of her right anterior saphenous vein. She has the usual manner bruising and mild tenderness. He does have a compression of a large tributary varicosities arising this down the lateral aspect of her right thigh and calf.  Venous duplex: Excess full ablation of anterior branch of her saphenous vein with no evidence of DVT  Impression and plan successful ablation of anterior branch of her right great saphenous vein. She will continue elevation and compression for one additional week we will see her again in 3 months for continued followup she understands that she may require stab phlebectomy of these large tributary varicosities should saphenous ablation alone not be successful in relieving her symptoms.

## 2011-06-22 NOTE — Procedures (Unsigned)
DUPLEX DEEP VENOUS EXAM - LOWER EXTREMITY  INDICATION:  Right lower extremity laser ablation.  HISTORY:  Edema:  No. Trauma/Surgery:  Right saphenous vein laser ablation on 06/01/2011. Pain:  Right thigh pain. PE:  No. Previous DVT:  No. Anticoagulants: Other:  DUPLEX EXAM:               CFV   SFV   PopV  PTV    GSV               R  L  R  L  R  L  R   L  R  L Thrombosis    o  o  o     o     o      o Spontaneous   +  +  +     +     +      + Phasic        +  +  +     +     +      + Augmentation  +  +  +     +     +      + Compressible  +  +  +     +     +      + Competent     0  0  0     +     +      +  Legend:  + - yes  o - no  p - partial  D - decreased  IMPRESSION: 1. No evidence of deep venous thrombosis noted in the right lower     extremity. 2. The right anterior accessory saphenous vein appears totally     occluded from the distal insertion site to the great saphenous     vein. 3. Reflux of >500 milliseconds noted in the widely patent right great     saphenous and bilateral common femoral veins.   _____________________________ Rosetta Posner, M.D.  CH/MEDQ  D:  06/08/2011  T:  06/08/2011  Job:  [id number]

## 2011-09-04 ENCOUNTER — Encounter: Payer: Self-pay | Admitting: Vascular Surgery

## 2011-09-05 ENCOUNTER — Encounter: Payer: Self-pay | Admitting: Vascular Surgery

## 2011-09-05 ENCOUNTER — Ambulatory Visit (INDEPENDENT_AMBULATORY_CARE_PROVIDER_SITE_OTHER): Payer: Medicare Other | Admitting: Vascular Surgery

## 2011-09-05 VITALS — BP 129/66 | HR 70 | Resp 18 | Ht 59.0 in | Wt 148.8 lb

## 2011-09-05 DIAGNOSIS — I83893 Varicose veins of bilateral lower extremities with other complications: Secondary | ICD-10-CM

## 2011-09-05 NOTE — Progress Notes (Signed)
The patient presents today for followup of her laser ablation of right saphenous vein. As was an [name] December 2012. She does still have some enlargement of the tributary branches below this but these are markedly improved since her ablation of the great saphenous vein proximal to them. She reports that her pain is completely resolved and she is comfortable with this..  Past Medical History  Diagnosis Date  . Microscopic colitis 9/11    Colonoscopy  . Hemorrhoids   . Diverticulosis   . Schatzki's ring     Last EGD with esophageal dilatation 33F  9/11  . Congenital heart disease   . Paroxysmal SVT (supraventricular tachycardia)   . IDA (iron deficiency anemia)   . GERD (gastroesophageal reflux disease)   . Hypothyroidism   . Hypertension   . Hyperlipidemia   . Renal insufficiency   . Gout   . PVD (peripheral vascular disease)   . Hyperparathyroidism   . Scoliosis   . Chronic bronchitis   . Varicose veins right leg pain and swelling    History  Substance Use Topics  . Smoking status: Never Smoker   . Smokeless tobacco: Never Used  . Alcohol Use: No    Family History  Problem Relation Age of Onset  . Stroke Mother   . Lung cancer Father     Allergies  Allergen Reactions  . Hydrocodone-Acetaminophen   . Ibuprofen   . Oxycodone Hcl   . Penicillins     Current outpatient prescriptions:allopurinol (ZYLOPRIM) 100 MG tablet, Take 1 tablet by mouth Twice daily., Disp: , Rfl: ;  atenolol (TENORMIN) 50 MG tablet, Take 1 tablet by mouth daily., Disp: , Rfl: ;  calcium carbonate (OS-CAL) 600 MG TABS, Take 600 mg by mouth 2 (two) times daily with a meal.  , Disp: , Rfl: ;  clobetasol (TEMOVATE) 0.05 % cream, Apply 1 application topically Three times a day., Disp: , Rfl:  COLCRYS 0.6 MG tablet, Take 1 tablet by mouth Twice daily., Disp: , Rfl: ;  diltiazem (CARDIZEM CD) 180 MG 24 hr capsule, Take 1 tablet by mouth daily., Disp: , Rfl: ;  fenofibrate 160 MG tablet, Take 1 tablet by  mouth daily., Disp: , Rfl: ;  fish oil-omega-3 fatty acids 1000 MG capsule, Take 1 g by mouth 2 (two) times daily.  , Disp: , Rfl: ;  furosemide (LASIX) 40 MG tablet, Take 40 mg by mouth daily.  , Disp: , Rfl:  gabapentin (NEURONTIN) 100 MG capsule, Take 100 mg by mouth at bedtime.  , Disp: , Rfl: ;  KLOR-CON M10 10 MEQ tablet, Take 1 tablet by mouth daily., Disp: , Rfl: ;  levothyroxine (SYNTHROID, LEVOTHROID) 100 MCG tablet, Take 1 tablet by mouth daily., Disp: , Rfl: ;  omeprazole (PRILOSEC) 20 MG capsule, Take 20 mg by mouth 2 (two) times daily.  , Disp: , Rfl: ;  Probiotic Product (ALIGN PO), Take 4 mg by mouth.  , Disp: , Rfl:  simvastatin (ZOCOR) 20 MG tablet, Take 20 mg by mouth at bedtime.  , Disp: , Rfl: ;  sitaGLIPtin (JANUVIA) 50 MG tablet, Take 50 mg by mouth daily.  , Disp: , Rfl: ;  VENTOLIN HFA 108 (90 BASE) MCG/ACT inhaler, Take 1 puff by mouth Every 4 hours as needed., Disp: , Rfl: ;  vitamin E 400 UNIT capsule, Take 400 Units by mouth 2 (two) times daily.  , Disp: , Rfl:  zolpidem (AMBIEN) 10 MG tablet, Take 1 tablet by mouth At  bedtime as needed., Disp: , Rfl: ;  ferrous sulfate 325 (65 FE) MG tablet, Take 325 mg by mouth daily with breakfast.  , Disp: , Rfl:   BP 129/66  Pulse 70  Resp 18  Ht 4\' 11"  (1.499 m)  Wt 148 lb 12.8 oz (67.495 kg)  BMI 30.05 kg/m2  Body mass index is 30.05 kg/(m^2).      Impression and plan: Successful decompression of painful varicosities right leg. Patient continued elevation and compression when appropriate will see [place] on an as-needed basis. She understands that if she does have pain in these tributaries that she will could be treated with stab phlebectomy as an outpatient

## 2011-09-06 ENCOUNTER — Ambulatory Visit (HOSPITAL_COMMUNITY)
Admission: RE | Admit: 2011-09-06 | Discharge: 2011-09-06 | Disposition: A | Discharge status: Home / Self Care | Payer: Medicare Other | Source: Ambulatory Visit | Attending: Internal Medicine | Admitting: Internal Medicine

## 2011-09-06 ENCOUNTER — Other Ambulatory Visit (HOSPITAL_COMMUNITY): Payer: Self-pay | Admitting: Internal Medicine

## 2011-09-06 DIAGNOSIS — I10 Essential (primary) hypertension: Secondary | ICD-10-CM | POA: Insufficient documentation

## 2011-09-06 DIAGNOSIS — R079 Chest pain, unspecified: Secondary | ICD-10-CM | POA: Insufficient documentation

## 2011-09-06 DIAGNOSIS — M4120 Other idiopathic scoliosis, site unspecified: Secondary | ICD-10-CM | POA: Insufficient documentation

## 2011-10-23 ENCOUNTER — Other Ambulatory Visit: Payer: Self-pay | Admitting: *Deleted

## 2011-10-23 DIAGNOSIS — L98499 Non-pressure chronic ulcer of skin of other sites with unspecified severity: Secondary | ICD-10-CM

## 2011-10-23 DIAGNOSIS — Z48812 Encounter for surgical aftercare following surgery on the circulatory system: Secondary | ICD-10-CM

## 2011-10-23 DIAGNOSIS — I739 Peripheral vascular disease, unspecified: Secondary | ICD-10-CM

## 2011-10-26 ENCOUNTER — Encounter: Payer: Self-pay | Admitting: Neurosurgery

## 2011-10-27 ENCOUNTER — Encounter (INDEPENDENT_AMBULATORY_CARE_PROVIDER_SITE_OTHER): Payer: Medicare Other | Admitting: *Deleted

## 2011-10-27 ENCOUNTER — Ambulatory Visit (INDEPENDENT_AMBULATORY_CARE_PROVIDER_SITE_OTHER): Payer: Medicare Other | Admitting: Neurosurgery

## 2011-10-27 ENCOUNTER — Encounter: Payer: Self-pay | Admitting: Neurosurgery

## 2011-10-27 VITALS — BP 122/79 | HR 60 | Resp 12 | Ht 58.5 in | Wt 146.9 lb

## 2011-10-27 DIAGNOSIS — I739 Peripheral vascular disease, unspecified: Secondary | ICD-10-CM

## 2011-10-27 DIAGNOSIS — Z48812 Encounter for surgical aftercare following surgery on the circulatory system: Secondary | ICD-10-CM

## 2011-10-27 NOTE — Progress Notes (Signed)
[facility] [facility] [place] HISTORY AND PHYSICAL   CC: Six-month serial ABIs  Referring Physician: Early  History of Present Illness: This is a 56 year old female patient of Dr. Donnetta Hutching seen for serial ABIs status post a right femoral endarterectomy in April 2007. Patient reports no rest pain in lower extremities, no pain with walking. She reports no problems with feeling cold in her lower extremities and her feet appear to be well-perfused today.  Past Medical History  Diagnosis Date  . Microscopic colitis 9/11    Colonoscopy  . Hemorrhoids   . Diverticulosis   . Schatzki's ring     Last EGD with esophageal dilatation 83F  9/11  . Congenital heart disease   . Paroxysmal SVT (supraventricular tachycardia)   . IDA (iron deficiency anemia)   . GERD (gastroesophageal reflux disease)   . Hypothyroidism   . Hypertension   . Hyperlipidemia   . Renal insufficiency   . Gout   . PVD (peripheral vascular disease)   . Hyperparathyroidism   . Scoliosis   . Chronic bronchitis   . Varicose veins right leg pain and swelling    ROS: [x]  Positive   [ ]  Denies    General: [ ]  Weight loss, [ ]  Fever, [ ]  chills Neurologic: [ ]  Dizziness, [ ]  Blackouts, [ ]  Seizure [ ]  Stroke, [ ]  "Mini stroke", [ ]  Slurred speech, [ ]  Temporary blindness; [ ]  weakness in arms or legs, [ ]  Hoarseness Cardiac: [ ]  Chest pain/pressure, [ ]  Shortness of breath at rest [ ]  Shortness of breath with exertion, [ ]  Atrial fibrillation or irregular heartbeat Vascular: [ ]  Pain in legs with walking, [ ]  Pain in legs at rest, [ ]  Pain in legs at night,  [ ]  Non-healing ulcer, [ ]  Blood clot in vein/DVT,   Pulmonary: [ ]  Home oxygen, [ ]  Productive cough, [ ]  Coughing up blood, [ ]  Asthma,  [ ]  Wheezing Musculoskeletal:  [ ]  Arthritis, [ ]  Low back pain, [ ]  Joint pain Hematologic: [ ]  Easy Bruising, [ ]  Anemia; [ ]  Hepatitis Gastrointestinal: [ ]  Blood in stool, [ ]  Gastroesophageal Reflux/heartburn, [ ]   Trouble swallowing Urinary: [ ]  chronic Kidney disease, [ ]  on HD - [ ]  MWF or [ ]  TTHS, [ ]  Burning with urination, [ ]  Difficulty urinating Skin: [ ]  Rashes, [ ]  Wounds Psychological: [ ]  Anxiety, [ ]  Depression   Social History History  Substance Use Topics  . Smoking status: Never Smoker   . Smokeless tobacco: Never Used  . Alcohol Use: No    Family History Family History  Problem Relation Age of Onset  . Stroke Mother   . Lung cancer Father     Allergies  Allergen Reactions  . Hydrocodone-Acetaminophen   . Ibuprofen   . Oxycodone Hcl   . Penicillins     Current Outpatient Prescriptions  Medication Sig Dispense Refill  . allopurinol (ZYLOPRIM) 100 MG tablet Take 1 tablet by mouth Twice daily.      Marland Kitchen atenolol (TENORMIN) 50 MG tablet Take 1 tablet by mouth daily.      . calcium carbonate (OS-CAL) 600 MG TABS Take 600 mg by mouth 2 (two) times daily with a meal.        . clobetasol (TEMOVATE) 0.05 % cream Apply 1 application topically Three times a day.      Marland Kitchen COLCRYS 0.6 MG tablet Take 1 tablet by mouth Twice daily.      Marland Kitchen  diltiazem (CARDIZEM CD) 180 MG 24 hr capsule Take 1 tablet by mouth daily.      . fenofibrate 160 MG tablet Take 1 tablet by mouth daily.      . ferrous sulfate 325 (65 FE) MG tablet Take 325 mg by mouth daily with breakfast.        . fish oil-omega-3 fatty acids 1000 MG capsule Take 1 g by mouth 2 (two) times daily.        . furosemide (LASIX) 40 MG tablet Take 40 mg by mouth daily.        Marland Kitchen gabapentin (NEURONTIN) 100 MG capsule Take 100 mg by mouth at bedtime.        Marland Kitchen KLOR-CON M10 10 MEQ tablet Take 1 tablet by mouth daily.      Marland Kitchen levothyroxine (SYNTHROID, LEVOTHROID) 100 MCG tablet Take 1 tablet by mouth daily.      Marland Kitchen omeprazole (PRILOSEC) 20 MG capsule Take 20 mg by mouth 2 (two) times daily.        . Probiotic Product (ALIGN PO) Take 4 mg by mouth.        . simvastatin (ZOCOR) 20 MG tablet Take 20 mg by mouth at bedtime.        . sitaGLIPtin  (JANUVIA) 50 MG tablet Take 50 mg by mouth daily.        . VENTOLIN HFA 108 (90 BASE) MCG/ACT inhaler Take 1 puff by mouth Every 4 hours as needed.      . vitamin E 400 UNIT capsule Take 400 Units by mouth 2 (two) times daily.        Marland Kitchen zolpidem (AMBIEN) 10 MG tablet Take 1 tablet by mouth At bedtime as needed.        Physical Examination  Filed Vitals:   10/27/11 1509  BP: 122/79  Pulse: 60  Resp: 12    Body mass index is 30.18 kg/(m^2).  General:  WDWN in NAD Gait: Normal HEENT: WNL Eyes: Pupils equal Pulmonary: normal non-labored breathing , without Rales, rhonchi,  wheezing Cardiac: RRR, without  Murmurs, rubs or gallops; No carotid bruits Abdomen: soft, NT, no masses Skin: no rashes, ulcers noted Vascular Exam/Pulses: Patient has palpable PT and DP pulses bilaterally both somewhat dampened to palpation. 2+ radial pulses bilaterally no carotid bruits heard  Extremities without ischemic changes, no Gangrene , no cellulitis; no open wounds;  Musculoskeletal: no muscle wasting or atrophy  Neurologic: A&O X 3; Appropriate Affect ; SENSATION: normal; MOTOR FUNCTION:  moving all extremities equally. Speech is fluent/normal  Non-Invasive Vascular Imaging: ABIs today are 1.08 on the right with triphasic flow, 1.07 on the left with triphasic flow no change from previous study  ASSESSMENT/PLAN: Assessment as above, plan will be for the patient to return in one year for repeat lower arterial ABIs as well as lower extremity arterial duplex she is in agreement with this, her questions were encouraged and answered.  Beatris Ship ANP  Clinic M.D.: Bridgett Larsson

## 2011-10-27 NOTE — Procedures (Unsigned)
LOWER EXTREMITY ARTERIAL DUPLEX  INDICATION:  Followup peripheral arterial disease.  HISTORY: Diabetes:  Yes Cardiac:  No Hypertension:  Yes Smoking:  No Previous Surgery:  Right femoral endarterectomy 10/16/2005  SINGLE LEVEL ARTERIAL EXAM                         RIGHT                LEFT Brachial: Anterior tibial: Posterior tibial: Peroneal:                    Ankle/Brachial Index:   1.08  1.07  PRIOR ABIs:  10/28/2011   1.13                 1.17  LOWER EXTREMITY ARTERIAL DUPLEX EXAM  DUPLEX:  Triphasic waveforms throughout right lower extremity, without evidence of stenosis.  IMPRESSION: 1. Patent right common femoral artery endarterectomy site, with no     evidence for restenosis. 2. ABIs within normal limits, no change since prior study 10/28/2010.     ___________________________________________ Rosetta Posner, M.D.  SS/MEDQ  D:  10/27/2011  T:  10/27/2011  Job:  [id number]

## 2011-10-30 NOTE — Progress Notes (Signed)
Addended by: Mena Goes on: 10/30/2011 09:23 AM   Modules accepted: Orders

## 2012-01-23 ENCOUNTER — Other Ambulatory Visit (HOSPITAL_COMMUNITY): Payer: Self-pay | Admitting: Internal Medicine

## 2012-01-23 DIAGNOSIS — Z139 Encounter for screening, unspecified: Secondary | ICD-10-CM

## 2012-01-26 ENCOUNTER — Ambulatory Visit (HOSPITAL_COMMUNITY)
Admission: RE | Admit: 2012-01-26 | Discharge: 2012-01-26 | Disposition: A | Discharge status: Home / Self Care | Payer: Medicare Other | Source: Ambulatory Visit | Attending: Internal Medicine | Admitting: Internal Medicine

## 2012-01-26 DIAGNOSIS — Z139 Encounter for screening, unspecified: Secondary | ICD-10-CM

## 2012-01-26 DIAGNOSIS — Z1231 Encounter for screening mammogram for malignant neoplasm of breast: Secondary | ICD-10-CM | POA: Insufficient documentation

## 2012-04-17 ENCOUNTER — Encounter: Payer: Self-pay | Admitting: Internal Medicine

## 2012-10-30 ENCOUNTER — Other Ambulatory Visit: Payer: Self-pay | Admitting: *Deleted

## 2012-10-30 DIAGNOSIS — I739 Peripheral vascular disease, unspecified: Secondary | ICD-10-CM

## 2012-10-30 DIAGNOSIS — Z48812 Encounter for surgical aftercare following surgery on the circulatory system: Secondary | ICD-10-CM

## 2012-10-31 ENCOUNTER — Encounter: Payer: Self-pay | Admitting: Neurosurgery

## 2012-11-01 ENCOUNTER — Encounter (INDEPENDENT_AMBULATORY_CARE_PROVIDER_SITE_OTHER): Payer: Medicare Other | Admitting: *Deleted

## 2012-11-01 ENCOUNTER — Ambulatory Visit: Payer: Medicare Other | Admitting: Neurosurgery

## 2012-11-01 DIAGNOSIS — Z48812 Encounter for surgical aftercare following surgery on the circulatory system: Secondary | ICD-10-CM

## 2012-11-01 DIAGNOSIS — I739 Peripheral vascular disease, unspecified: Secondary | ICD-10-CM

## 2012-11-05 ENCOUNTER — Other Ambulatory Visit: Payer: Self-pay

## 2012-11-05 DIAGNOSIS — I739 Peripheral vascular disease, unspecified: Secondary | ICD-10-CM

## 2012-11-05 DIAGNOSIS — Z48812 Encounter for surgical aftercare following surgery on the circulatory system: Secondary | ICD-10-CM

## 2012-11-07 ENCOUNTER — Encounter: Payer: Self-pay | Admitting: Vascular Surgery

## 2013-03-10 ENCOUNTER — Other Ambulatory Visit (HOSPITAL_COMMUNITY): Payer: Self-pay | Admitting: Internal Medicine

## 2013-03-10 DIAGNOSIS — Z139 Encounter for screening, unspecified: Secondary | ICD-10-CM

## 2013-03-14 ENCOUNTER — Ambulatory Visit (HOSPITAL_COMMUNITY)
Admission: RE | Admit: 2013-03-14 | Discharge: 2013-03-14 | Disposition: A | Discharge status: Home / Self Care | Payer: Medicare Other | Source: Ambulatory Visit | Attending: Internal Medicine | Admitting: Internal Medicine

## 2013-03-14 DIAGNOSIS — Z139 Encounter for screening, unspecified: Secondary | ICD-10-CM

## 2013-03-14 DIAGNOSIS — Z1231 Encounter for screening mammogram for malignant neoplasm of breast: Secondary | ICD-10-CM | POA: Insufficient documentation

## 2013-04-11 ENCOUNTER — Encounter (INDEPENDENT_AMBULATORY_CARE_PROVIDER_SITE_OTHER): Payer: Self-pay

## 2013-04-11 ENCOUNTER — Ambulatory Visit (INDEPENDENT_AMBULATORY_CARE_PROVIDER_SITE_OTHER): Payer: Medicare Other | Admitting: Gastroenterology

## 2013-04-11 ENCOUNTER — Encounter: Payer: Self-pay | Admitting: Gastroenterology

## 2013-04-11 VITALS — BP 123/80 | HR 86 | Temp 97.4°F | Ht 58.5 in | Wt 155.2 lb

## 2013-04-11 DIAGNOSIS — K219 Gastro-esophageal reflux disease without esophagitis: Secondary | ICD-10-CM

## 2013-04-11 DIAGNOSIS — K5900 Constipation, unspecified: Secondary | ICD-10-CM

## 2013-04-11 MED ORDER — OMEPRAZOLE 20 MG PO CPDR: 20.0000 mg | DELAYED_RELEASE_CAPSULE | Freq: Two times a day (BID) | ORAL | Status: DC

## 2013-04-11 NOTE — Assessment & Plan Note (Signed)
Continue omeprazole 20 mg twice a day. Anti-reflex measures. Try to lose at least 5 pounds in the next couple of months.

## 2013-04-11 NOTE — Patient Instructions (Signed)
1. Continue omeprazole 20 mg before breakfast and before your evening meal. New prescription sent to [facility]. 2. You may take MiraLax one capful at bedtime on days you do not have an adequate bowel movement.  Diet for Gastroesophageal Reflux Disease, Adult Reflux (acid reflux) is when acid from your stomach flows up into the esophagus. When acid comes in contact with the esophagus, the acid causes irritation and soreness (inflammation) in the esophagus. When reflux happens often or so severely that it causes damage to the esophagus, it is called gastroesophageal reflux disease (GERD). Nutrition therapy can help ease the discomfort of GERD. FOODS OR DRINKS TO AVOID OR LIMIT  Smoking or chewing tobacco. Nicotine is one of the most potent stimulants to acid production in the gastrointestinal tract.  Caffeinated and decaffeinated coffee and black tea.  Regular or low-calorie carbonated beverages or energy drinks (caffeine-free carbonated beverages are allowed).   Strong spices, such as black pepper, white pepper, red pepper, cayenne, curry powder, and chili powder.  Peppermint or spearmint.  Chocolate.  High-fat foods, including meats and fried foods. Extra added fats including oils, butter, salad dressings, and nuts. Limit these to less than 8 tsp per day.  Fruits and vegetables if they are not tolerated, such as citrus fruits or tomatoes.  Alcohol.  Any food that seems to aggravate your condition. If you have questions regarding your diet, call your caregiver or a registered dietitian. OTHER THINGS THAT MAY HELP GERD INCLUDE:   Eating your meals slowly, in a relaxed setting.  Eating 5 to 6 small meals per day instead of 3 large meals.  Eliminating food for a period of time if it causes distress.  Not lying down until 3 hours after eating a meal.  Keeping the head of your bed raised 6 to 9 inches (15 to 23 cm) by using a foam wedge or blocks under the legs of the bed.  Lying flat may make symptoms worse.  Being physically active. Weight loss may be helpful in reducing reflux in overweight or obese adults.  Wear loose fitting clothing EXAMPLE MEAL PLAN This meal plan is approximately 2,000 calories based on [email or web address] meal planning guidelines. Breakfast   cup cooked oatmeal.  1 cup strawberries.  1 cup low-fat milk.  1 oz almonds. Snack  1 cup cucumber slices.  6 oz yogurt (made from low-fat or fat-free milk). Lunch  2 slice whole-wheat bread.  2 oz sliced [place].  2 tsp mayonnaise.  1 cup blueberries.  1 cup snap peas. Snack  6 whole-wheat crackers.  1 oz string cheese. Dinner   cup brown rice.  1 cup mixed veggies.  1 tsp olive oil.  3 oz grilled fish. Document Released: 06/12/2005 Document Revised: 09/04/2011 Document Reviewed: 04/28/2011 [facility] Patient Information 2014 [place], [place].

## 2013-04-11 NOTE — Progress Notes (Signed)
Primary Care Physician: Delphina Cahill, MD  Primary Gastroenterologist:  Garfield Cornea, MD   Chief Complaint  Patient presents with  . Abdominal Pain  . Heartburn    HPI: [name] is a 57 y.o. female here for followup visit. She was last seen in November 2012. She has history of lymphocytic colitis, GERD, Schatzki ring. Previous celiac AB panel negative. SB bx negative. GES normal.  Made appointment 2-3 weeks ago when started having some constipation. Used Miralax with good results. No melena, brbpr. No dysphagia. Omeprazole 20mg  BID. Usually does good for her except for dietary indiscretions and if misses dose. Up all night the other night after eating pizza and running out of omeprazole. Has been out for 3 days. No n/v. Weight up 7 pounds since 04/2011. Denies abdominal pain.  Current Outpatient Prescriptions  Medication Sig Dispense Refill  . allopurinol (ZYLOPRIM) 100 MG tablet Take 1 tablet by mouth Twice daily.      Marland Kitchen atenolol (TENORMIN) 50 MG tablet Take 1 tablet by mouth daily.      . calcium carbonate (OS-CAL) 600 MG TABS Take 600 mg by mouth 2 (two) times daily with a meal.        . COLCRYS 0.6 MG tablet Take 1 tablet by mouth Twice daily.      . fenofibrate 160 MG tablet Take 1 tablet by mouth daily.      . fish oil-omega-3 fatty acids 1000 MG capsule Take 1 g by mouth 2 (two) times daily.        Marland Kitchen glipiZIDE (GLUCOTROL XL) 2.5 MG 24 hr tablet       . levothyroxine (SYNTHROID, LEVOTHROID) 100 MCG tablet Take 1 tablet by mouth daily.      Marland Kitchen losartan (COZAAR) 25 MG tablet Take 12.5 mg by mouth daily.       . magnesium oxide (MAG-OX) 400 MG tablet Take 400 mg by mouth daily.      Marland Kitchen omeprazole (PRILOSEC) 20 MG capsule Take 20 mg by mouth 2 (two) times daily.        . Probiotic Product (ALIGN PO) Take 4 mg by mouth.        . sitaGLIPtin (JANUVIA) 50 MG tablet Take 50 mg by mouth daily.        Marland Kitchen zolpidem (AMBIEN) 10 MG tablet Take 1 tablet by mouth At bedtime as needed.        No current facility-administered medications for this visit.    Allergies as of 04/11/2013 - Review Complete 04/11/2013  Allergen Reaction Noted  . Hydrocodone-acetaminophen    . Ibuprofen  09/05/2011  . Oxycodone hcl    . Penicillins      ROS:  General: Negative for anorexia, weight loss, fever, chills, fatigue, weakness. ENT: Negative for hoarseness, difficulty swallowing , nasal congestion. CV: Negative for chest pain, angina, palpitations, dyspnea on exertion, peripheral edema.  Respiratory: Negative for dyspnea at rest, dyspnea on exertion, cough, sputum, wheezing.  GI: See history of present illness. GU:  Negative for dysuria, hematuria, urinary incontinence, urinary frequency, nocturnal urination.  Endo: Negative for unusual weight change.    Physical Examination:   BP 123/80  Pulse 86  Temp(Src) 97.4 F (36.3 C) (Oral)  Ht 4' 10.5" (1.486 m)  Wt 155 lb 3.2 oz (70.398 kg)  BMI 31.88 kg/m2  General: Well-nourished, well-developed in no acute distress.  Eyes: No icterus. Mouth: Oropharyngeal mucosa moist and pink , no lesions erythema or exudate. Lungs: Clear to auscultation bilaterally.  Heart: Regular rate and rhythm, no murmurs rubs or gallops.  Abdomen: Bowel sounds are normal, nontender, nondistended, no hepatosplenomegaly or masses, no abdominal bruits or hernia , no rebound or guarding.   Extremities: No lower extremity edema. No clubbing or deformities. Neuro: Alert and oriented x 4   Skin: Warm and dry, no jaundice.   Psych: Alert and cooperative, normal mood and affect.

## 2013-04-11 NOTE — Assessment & Plan Note (Signed)
You may take MiraLax 1 capful at bedtime on days you do not have adequate bowel movement.

## 2013-04-14 NOTE — Progress Notes (Signed)
cc'd to pcp 

## 2013-04-30 ENCOUNTER — Encounter: Payer: Self-pay | Admitting: Cardiology

## 2013-04-30 ENCOUNTER — Ambulatory Visit (INDEPENDENT_AMBULATORY_CARE_PROVIDER_SITE_OTHER): Payer: Medicare Other | Admitting: Cardiology

## 2013-04-30 VITALS — BP 131/83 | HR 57 | Ht <= 58 in | Wt 159.0 lb

## 2013-04-30 DIAGNOSIS — R002 Palpitations: Secondary | ICD-10-CM

## 2013-04-30 DIAGNOSIS — K219 Gastro-esophageal reflux disease without esophagitis: Secondary | ICD-10-CM

## 2013-04-30 DIAGNOSIS — I10 Essential (primary) hypertension: Secondary | ICD-10-CM

## 2013-04-30 DIAGNOSIS — E0590 Thyrotoxicosis, unspecified without thyrotoxic crisis or storm: Secondary | ICD-10-CM

## 2013-04-30 DIAGNOSIS — R0602 Shortness of breath: Secondary | ICD-10-CM

## 2013-04-30 LAB — CBC
HCT: 39 % (ref 36.0–46.0)
Hemoglobin: 13 g/dL (ref 12.0–15.0)
MCH: 30.8 pg (ref 26.0–34.0)
MCHC: 33.3 g/dL (ref 30.0–36.0)
MCV: 92.4 fL (ref 78.0–100.0)

## 2013-04-30 LAB — BASIC METABOLIC PANEL
CO2: 26 mEq/L (ref 19–32)
Chloride: 105 mEq/L (ref 96–112)
Potassium: 4.3 mEq/L (ref 3.5–5.3)
Sodium: 141 mEq/L (ref 135–145)

## 2013-04-30 LAB — MAGNESIUM: Magnesium: 1.7 mg/dL (ref 1.5–2.5)

## 2013-04-30 NOTE — Progress Notes (Signed)
Clinical Summary [name] is a 57 y.o.female seen today for follow up of the following problems.   1. ASD repair - at 76 years old at [facility] - notes some DOE with activities aside from palpitations, which has developed over the last month. No LE edema, occas orthopnea.   2. Paroxysmal SVT - prior admission in setting of GI bleed and iatrogenic hyperthyroid - per notes long RP tachycardica. Normal echo at that time, started on atenolol and dilt  - notes some palpitations that occur daily over the last 2 weeks. Feeling of heart pounding, feels SOB, feels lightheaded. Episodes last 5 minutes, often brought on by exertion.  - compliant with atenolol. - no coffee, drinks 4 liters of diet coke a week, drinks a lot of cold tea. No alcohol - history of hyperthyroidism, prior admit with worsened palpitaitons found to be on too much thyroid supplement. No recent TSH in our system.    Past Medical History  Diagnosis Date  . Microscopic colitis 9/11    Colonoscopy  . Hemorrhoids   . Diverticulosis   . Schatzki's ring     Last EGD with esophageal dilatation 46F  9/11  . Congenital heart disease   . Paroxysmal SVT (supraventricular tachycardia)   . IDA (iron deficiency anemia)   . GERD (gastroesophageal reflux disease)   . Hypothyroidism   . Hypertension   . Hyperlipidemia   . Renal insufficiency   . Gout   . PVD (peripheral vascular disease)   . Hyperparathyroidism   . Scoliosis   . Chronic bronchitis   . Varicose veins right leg pain and swelling     Allergies  Allergen Reactions  . Hydrocodone-Acetaminophen   . Ibuprofen   . Oxycodone Hcl   . Penicillins      Current Outpatient Prescriptions  Medication Sig Dispense Refill  . allopurinol (ZYLOPRIM) 100 MG tablet Take 1 tablet by mouth Twice daily.      Marland Kitchen atenolol (TENORMIN) 50 MG tablet Take 1 tablet by mouth daily.      . calcium carbonate (OS-CAL) 600 MG TABS Take 600 mg by mouth 2 (two) times daily  with a meal.        . COLCRYS 0.6 MG tablet Take 1 tablet by mouth Twice daily.      . fenofibrate 160 MG tablet Take 1 tablet by mouth daily.      . fish oil-omega-3 fatty acids 1000 MG capsule Take 1 g by mouth 2 (two) times daily.        Marland Kitchen glipiZIDE (GLUCOTROL XL) 2.5 MG 24 hr tablet       . levothyroxine (SYNTHROID, LEVOTHROID) 100 MCG tablet Take 1 tablet by mouth daily.      Marland Kitchen losartan (COZAAR) 25 MG tablet Take 12.5 mg by mouth daily.       . magnesium oxide (MAG-OX) 400 MG tablet Take 400 mg by mouth daily.      Marland Kitchen omeprazole (PRILOSEC) 20 MG capsule Take 1 capsule (20 mg total) by mouth 2 (two) times daily.  60 capsule  11  . Probiotic Product (ALIGN PO) Take 4 mg by mouth.        . sitaGLIPtin (JANUVIA) 50 MG tablet Take 50 mg by mouth daily.        Marland Kitchen zolpidem (AMBIEN) 10 MG tablet Take 1 tablet by mouth At bedtime as needed.       No current facility-administered medications for this visit.  Past Surgical History  Procedure Laterality Date  . Asd repair  Age [facility]  . Right common femoral endarterectomy  2007  . Left parathyroidectomy  2007  . Parathyroid adenoma  2007  . Left hemithyroidectomy    . Tonsillectomy    . Hemorrhoid surgery  2010  . Colonoscopy  03/04/2010    anal canal hemorrhoids otherwise normal. TI normal. Bx showed lymphocytic colitis  . Esophagogastroduodenoscopy  03/04/2010    noncritical appearing Schatzki ring. SB bx negative  . Elas  06-01-11    Right saphenous ELAS      Allergies  Allergen Reactions  . Hydrocodone-Acetaminophen   . Ibuprofen   . Oxycodone Hcl   . Penicillins       Family History  Problem Relation Age of Onset  . Stroke Mother   . Lung cancer Father      Social History [name] reports that she has never smoked. She has never used smokeless tobacco. [name] reports that she does not drink alcohol.   Review of Systems CONSTITUTIONAL: No weight loss, fever,  chills, weakness or fatigue.  HEENT: Eyes: No visual loss, blurred vision, double vision or yellow sclerae.No hearing loss, sneezing, congestion, runny nose or sore throat.  SKIN: No rash or itching.  CARDIOVASCULAR: per HPI RESPIRATORY: per HPI GASTROINTESTINAL: No anorexia, nausea, vomiting or diarrhea. No abdominal pain or blood.  GENITOURINARY: No burning on urination, no polyuria NEUROLOGICAL: No headache, dizziness, syncope, paralysis, ataxia, numbness or tingling in the extremities. No change in bowel or bladder control.  MUSCULOSKELETAL: No muscle, back pain, joint pain or stiffness.  LYMPHATICS: No enlarged nodes. No history of splenectomy.  PSYCHIATRIC: No history of depression or anxiety.  ENDOCRINOLOGIC: No reports of sweating, cold or heat intolerance. No polyuria or polydipsia.  Marland Kitchen   Physical Examination p 57 bp 131/83 Wt 159 lbs BMI 33 Gen: resting comfortably, no acute distress HEENT: no scleral icterus, pupils equal round and reactive, no palptable cervical adenopathy,  CV: regular, rate 55, no m/r/g, no JVD, no carotid bruits Resp: Clear to auscultation bilaterally GI: abdomen is soft, non-tender, non-distended, normal bowel sounds, no hepatosplenomegaly MSK: extremities are warm, no edema.  Skin: warm, no rash Neuro:  no focal deficits Psych: appropriate affect   Diagnostic Studies 04/30/13 Clinic EKG: sinus brady, incomplete RBB, normal axis, PAC, inferior Q waves    Assessment and Plan  1. Palpitations - history of paroxysmal SVT previously well controlled on atenolol, from prior notes described as a long RP SVT. - increased symptoms over the last few weeks. Of note prior exacerbation related to too much thyroid medication, will check TSH. Also check BMET, CBC, Mg level. - order 48 hr holter monitor - will keep atenolol at current dose as she has a resting bradycardia - follow up next week.   2. DOE - unclear etiology, seems to have some symptoms separate  from the palpitations. Reports some occasional orthopnea as well - remote history of ASD with repair as a child, no recent echo on system - will order 2D echo to further evaluate.     [name], M.D., F.A.C.C.

## 2013-04-30 NOTE — Patient Instructions (Addendum)
Your physician recommends that you schedule a follow-up appointment in: NEXT Thursday OR Friday  Your physician has requested that you have an echocardiogram. Echocardiography is a painless test that uses sound waves to create images of your heart. It provides your doctor with information about the size and shape of your heart and how well your heart's chambers and valves are working. This procedure takes approximately one hour. There are no restrictions for this procedure.  Your physician recommends that you return for lab work in: TODAY ([place] TSH, BMET,CBC,MAGNESIUM)  Your physician has recommended that you wear a holter monitor. Holter monitors are medical devices that record the heart's electrical activity. Doctors most often use these monitors to diagnose arrhythmias. Arrhythmias are problems with the speed or rhythm of the heartbeat. The monitor is a small, portable device. You can wear one while you do your normal daily activities. This is usually used to diagnose what is causing palpitations/syncope (passing out).  WE WILL CALL YOU WITH YOUR TEST RESULTS/INSTRUCTIONS/NEXT STEPS ONCE RECEIVED BY THE PROVIDER  PLEASE BE ADVISED YOU WILL STILL NEED TO KEEP YOUR FOLLOW UP APPOINTMENT TO DISCUSS FURTHER DETAILS OF YOUR TEST RESULTS/NEXT STEPS/FUTURE PLAN OF CARE WITH YOUR PROVIDER DESPITE THE FACT THAT YOU MAY HAVE NORMAL TEST RESULTS

## 2013-05-05 ENCOUNTER — Ambulatory Visit (HOSPITAL_COMMUNITY)
Admission: RE | Admit: 2013-05-05 | Discharge: 2013-05-05 | Disposition: A | Discharge status: Home / Self Care | Payer: Medicare Other | Source: Ambulatory Visit | Attending: Cardiology | Admitting: Cardiology

## 2013-05-05 DIAGNOSIS — R002 Palpitations: Secondary | ICD-10-CM | POA: Insufficient documentation

## 2013-05-05 DIAGNOSIS — R0602 Shortness of breath: Secondary | ICD-10-CM

## 2013-05-05 DIAGNOSIS — R0989 Other specified symptoms and signs involving the circulatory and respiratory systems: Secondary | ICD-10-CM | POA: Insufficient documentation

## 2013-05-05 DIAGNOSIS — I059 Rheumatic mitral valve disease, unspecified: Secondary | ICD-10-CM

## 2013-05-05 DIAGNOSIS — R0609 Other forms of dyspnea: Secondary | ICD-10-CM | POA: Insufficient documentation

## 2013-05-05 DIAGNOSIS — E0590 Thyrotoxicosis, unspecified without thyrotoxic crisis or storm: Secondary | ICD-10-CM

## 2013-05-05 DIAGNOSIS — I10 Essential (primary) hypertension: Secondary | ICD-10-CM

## 2013-05-05 DIAGNOSIS — E785 Hyperlipidemia, unspecified: Secondary | ICD-10-CM | POA: Insufficient documentation

## 2013-05-05 DIAGNOSIS — K219 Gastro-esophageal reflux disease without esophagitis: Secondary | ICD-10-CM

## 2013-05-05 NOTE — Progress Notes (Signed)
*  PRELIMINARY RESULTS* Echocardiogram 2D Echocardiogram has been performed.  [place], [place] 05/05/2013, 2:16 PM

## 2013-05-05 NOTE — Progress Notes (Signed)
48 hour Holter Monitor in progress.  Ends on 05-07-13 @ 2:30 pm

## 2013-05-07 ENCOUNTER — Telehealth: Payer: Self-pay | Admitting: *Deleted

## 2013-05-07 ENCOUNTER — Telehealth: Payer: Self-pay

## 2013-05-07 NOTE — Telephone Encounter (Signed)
Pt informed of lab resukts

## 2013-05-07 NOTE — Telephone Encounter (Signed)
Message copied by Bernita Raisin on Wed May 07, 2013  4:00 PM ------      Message from: Candy Kitchen F      Created: Wed May 07, 2013 12:46 PM       Please let patient know that her labs show no significant abnormalities.                   Carlyle Dolly MD ------

## 2013-05-07 NOTE — Telephone Encounter (Signed)
Spoke to patient concerning lab/test results/instructions from provider. Patient understood.    

## 2013-05-07 NOTE — Telephone Encounter (Signed)
Message copied by Truett Mainland on Wed May 07, 2013  4:44 PM ------      Message from: Forest F      Created: Wed May 07, 2013  4:37 PM       Please let patient know that her echo shows some evidence of changes to her heart from high blood pressure, specifically the heart is a little bit stiff. Otherwise her ultrasound was normal ------

## 2013-05-08 ENCOUNTER — Other Ambulatory Visit: Payer: Self-pay | Admitting: *Deleted

## 2013-05-08 ENCOUNTER — Ambulatory Visit (INDEPENDENT_AMBULATORY_CARE_PROVIDER_SITE_OTHER): Payer: Medicare Other | Admitting: Cardiology

## 2013-05-08 VITALS — BP 119/73 | HR 54 | Ht 58.5 in | Wt 159.0 lb

## 2013-05-08 DIAGNOSIS — R0609 Other forms of dyspnea: Secondary | ICD-10-CM

## 2013-05-08 DIAGNOSIS — R0989 Other specified symptoms and signs involving the circulatory and respiratory systems: Secondary | ICD-10-CM

## 2013-05-08 DIAGNOSIS — R0600 Dyspnea, unspecified: Secondary | ICD-10-CM

## 2013-05-08 DIAGNOSIS — R002 Palpitations: Secondary | ICD-10-CM

## 2013-05-08 MED ORDER — FUROSEMIDE 20 MG PO TABS: 20.0000 mg | ORAL_TABLET | Freq: Every day | ORAL | Status: DC

## 2013-05-08 MED ORDER — ATENOLOL 50 MG PO TABS: 75.0000 mg | ORAL_TABLET | Freq: Every day | ORAL | Status: DC

## 2013-05-08 NOTE — Patient Instructions (Addendum)
Your physician recommends that you schedule a follow-up appointment in: 6 weeks  Your physician has recommended you make the following change in your medication:   1) INCREASE YOUR ATENOLOL TO 75MG  ONCE DAILY (TAKE ONE AND A HALF TABLET OF THE 50MG  TABLET) 2) START TAKING LASIX 20MG  AS NEEDED FOR SWELLING

## 2013-05-08 NOTE — Progress Notes (Signed)
Clinical Summary [name] is a 57 y.o.female seen today for follow up for palpitations.   1. Paroxysmal SVT  - prior admission in setting of GI bleed and iatrogenic hyperthyroid  - per notes long RP tachycardica. Normal echo at that time, started on atenolol and dilt. Reports at some point dilt was stopped, has been only on atenolol - notes some palpitations that occur daily over the last few weeks. Feeling of heart pounding, feels SOB, feels lightheaded. Episodes last 5 minutes, often brought on by exertion.  - compliant with atenolol.  - no coffee, drinks 4 liters of diet coke a week, drinks a lot of cold tea. No alcohol  - history of hyperthyroidism, prior admit with worsened palpitaitons found to be on too much thyroid supplement  - since last visit denies any change in her symptoms. She completed her blood work, echo and 48 hr holter monitor.    Past Medical History  Diagnosis Date  . Microscopic colitis 9/11    Colonoscopy  . Hemorrhoids   . Diverticulosis   . Schatzki's ring     Last EGD with esophageal dilatation 83F  9/11  . Congenital heart disease   . Paroxysmal SVT (supraventricular tachycardia)   . IDA (iron deficiency anemia)   . GERD (gastroesophageal reflux disease)   . Hypothyroidism   . Hypertension   . Hyperlipidemia   . Renal insufficiency   . Gout   . PVD (peripheral vascular disease)   . Hyperparathyroidism   . Scoliosis   . Chronic bronchitis   . Varicose veins right leg pain and swelling     Allergies  Allergen Reactions  . Hydrocodone-Acetaminophen   . Ibuprofen   . Oxycodone Hcl   . Penicillins      Current Outpatient Prescriptions  Medication Sig Dispense Refill  . allopurinol (ZYLOPRIM) 100 MG tablet Take 1 tablet by mouth Twice daily.      Marland Kitchen atenolol (TENORMIN) 50 MG tablet Take 1 tablet by mouth daily.      . calcium carbonate (OS-CAL) 600 MG TABS Take 600 mg by mouth 2 (two) times daily with a meal.        . COLCRYS 0.6 MG  tablet Take 1 tablet by mouth Twice daily.      . fenofibrate 160 MG tablet Take 1 tablet by mouth daily.      . fish oil-omega-3 fatty acids 1000 MG capsule Take 1 g by mouth 2 (two) times daily.        Marland Kitchen glipiZIDE (GLUCOTROL XL) 2.5 MG 24 hr tablet       . levothyroxine (SYNTHROID, LEVOTHROID) 100 MCG tablet Take 1 tablet by mouth daily.      Marland Kitchen losartan (COZAAR) 25 MG tablet Take 12.5 mg by mouth daily.       . magnesium oxide (MAG-OX) 400 MG tablet Take 400 mg by mouth daily.      Marland Kitchen omeprazole (PRILOSEC) 20 MG capsule Take 1 capsule (20 mg total) by mouth 2 (two) times daily.  60 capsule  11  . Probiotic Product (ALIGN PO) Take 4 mg by mouth.        . sitaGLIPtin (JANUVIA) 50 MG tablet Take 50 mg by mouth daily.        Marland Kitchen zolpidem (AMBIEN) 10 MG tablet Take 1 tablet by mouth At bedtime as needed.       No current facility-administered medications for this visit.     Past Surgical History  Procedure  Laterality Date  . Asd repair  Age [facility]  . Right common femoral endarterectomy  2007  . Left parathyroidectomy  2007  . Parathyroid adenoma  2007  . Left hemithyroidectomy    . Tonsillectomy    . Hemorrhoid surgery  2010  . Colonoscopy  03/04/2010    anal canal hemorrhoids otherwise normal. TI normal. Bx showed lymphocytic colitis  . Esophagogastroduodenoscopy  03/04/2010    noncritical appearing Schatzki ring. SB bx negative  . Elas  06-01-11    Right saphenous ELAS      Allergies  Allergen Reactions  . Hydrocodone-Acetaminophen   . Ibuprofen   . Oxycodone Hcl   . Penicillins       Family History  Problem Relation Age of Onset  . Stroke Mother   . Lung cancer Father      Social History Ms. Keckler reports that she has never smoked. She has never used smokeless tobacco. Ms. Lamm reports that she does not drink alcohol.   Review of Systems CONSTITUTIONAL: No weight loss, fever, chills, weakness or fatigue.  HEENT: Eyes:  No visual loss, blurred vision, double vision or yellow sclerae.No hearing loss, sneezing, congestion, runny nose or sore throat.  SKIN: No rash or itching.  CARDIOVASCULAR: per HPI RESPIRATORY: some DOE GASTROINTESTINAL: No anorexia, nausea, vomiting or diarrhea. No abdominal pain or blood.  GENITOURINARY: No burning on urination, no polyuria NEUROLOGICAL: No headache, dizziness, syncope, paralysis, ataxia, numbness or tingling in the extremities. No change in bowel or bladder control.  MUSCULOSKELETAL: No muscle, back pain, joint pain or stiffness.  LYMPHATICS: No enlarged nodes. No history of splenectomy.  PSYCHIATRIC: No history of depression or anxiety.  ENDOCRINOLOGIC: No reports of sweating, cold or heat intolerance. No polyuria or polydipsia.  Marland Kitchen   Physical Examination Filed Vitals:   05/08/13 1254  BP: 119/73  Pulse: 54   Filed Weights   05/08/13 1254  Weight: 159 lb (72.122 kg)    Gen: resting comfortably, no acute distress HEENT: no scleral icterus, pupils equal round and reactive, no palptable cervical adenopathy,  CV: RRR, no m/r/g, no JVD, no carotid bruit Resp: Clear to auscultation bilaterally GI: abdomen is soft, non-tender, non-distended, normal bowel sounds, no hepatosplenomegaly MSK: extremities are warm, 1+ bilateral  Skin: warm, no rash Neuro:  no focal deficits Psych: appropriate affect   Diagnostic Studies 04/30/13 Clinic EKG: sinus brady, incomplete RBB, normal axis, PAC, inferior Q waves  05/05/2013 Echo: LVEF 60-65%, moderate LVH, no WMAs, elevated left atrial pressure     Assessment and Plan  1. Palpitations  - history of paroxysmal SVT previously well controlled on atenolol, from prior notes described as a long RP SVT.  - increased symptoms over the last few weeks. Of note prior exacerbation related to too much thyroid medication. Her most recent thyroid study is normal - 48 hour holter monitor shows short episodes of SVT, looks most  consistent with atach. - will increase atenolol to 75mg  daily, she has a low normal resting pulse rate. Counseled to monitor for any increased fatigue, lighteadedenss of dizziness and if so to decrease her dose. If does not tolerate, will need to consider alternative strategy such as referral to EP for possible EP study and ablation.    2. DOE  - unclear etiology, seems to have some symptoms separate from the palpitations. Reports some occasional orthopnea as well  - remote history of ASD with repair as a child, no  abnormality on recent echo of her interatrial septum - does have evidence of diastolic dysfunction with elevated left atrial pressures,  - will give Rx for lasix 20mg  prn   Follow up 6 weeks    [name], M.D., F.A.C.C.

## 2013-06-26 DIAGNOSIS — I4819 Other persistent atrial fibrillation: Secondary | ICD-10-CM

## 2013-06-26 HISTORY — DX: Other persistent atrial fibrillation: I48.19

## 2013-07-15 ENCOUNTER — Encounter: Payer: Self-pay | Admitting: Cardiology

## 2013-07-15 ENCOUNTER — Encounter: Payer: Self-pay | Admitting: *Deleted

## 2013-07-15 ENCOUNTER — Ambulatory Visit (INDEPENDENT_AMBULATORY_CARE_PROVIDER_SITE_OTHER): Payer: Medicare Other | Admitting: Cardiology

## 2013-07-15 VITALS — BP 116/80 | HR 102 | Ht <= 58 in | Wt 154.0 lb

## 2013-07-15 DIAGNOSIS — R0600 Dyspnea, unspecified: Secondary | ICD-10-CM

## 2013-07-15 DIAGNOSIS — R002 Palpitations: Secondary | ICD-10-CM

## 2013-07-15 DIAGNOSIS — R0609 Other forms of dyspnea: Secondary | ICD-10-CM

## 2013-07-15 DIAGNOSIS — R0989 Other specified symptoms and signs involving the circulatory and respiratory systems: Secondary | ICD-10-CM

## 2013-07-15 MED ORDER — ATENOLOL 100 MG PO TABS
100.0000 mg | ORAL_TABLET | Freq: Every day | ORAL | Status: DC
Start: 2013-07-15 — End: 2013-07-29

## 2013-07-15 NOTE — Progress Notes (Signed)
Clinical Summary [name] is a 58 y.o.female seen today for follow up of the following medical problems.   1. Paroxysmal SVT  - prior admission in setting of GI bleed and iatrogenic hyperthyroidism for SVT - per notes long RP tachycardica. Normal echo at that time, started on atenolol and dilt. Reports at some point dilt was stopped, has been only on atenolol  - notes some palpitations that occur daily over the last few weeks. Feeling of heart pounding, feels SOB, feels lightheaded. Episodes last 5 minutes, often brought on by exertion.  - compliant with atenolol.  - no coffee, drinks 4 liters of diet coke a week, drinks a lot of cold tea. No alcohol   - since last visit denies any change in her symptoms. She completed her blood work, echo and 48 hr holter monitor which showed short episodes of SSVT - last visit increased atenolol to 75 mg, without much change in symptoms.   2. DOE - typically occurs with exertion. Example walking up steps she gets very fatigued and SOB. No chest pain. Reports this has worsened over the last few months - no improvement after adding diuretic last visit, echo showed normal LV function with evidence of diastolic dysfunction.   Past Medical History  Diagnosis Date  . Microscopic colitis 9/11    Colonoscopy  . Hemorrhoids   . Diverticulosis   . Schatzki's ring     Last EGD with esophageal dilatation 48F  9/11  . Congenital heart disease   . Paroxysmal SVT (supraventricular tachycardia)   . IDA (iron deficiency anemia)   . GERD (gastroesophageal reflux disease)   . Hypothyroidism   . Hypertension   . Hyperlipidemia   . Renal insufficiency   . Gout   . PVD (peripheral vascular disease)   . Hyperparathyroidism   . Scoliosis   . Chronic bronchitis   . Varicose veins right leg pain and swelling     Allergies  Allergen Reactions  . Hydrocodone-Acetaminophen   . Ibuprofen   . Oxycodone Hcl   . Penicillins      Current Outpatient  Prescriptions  Medication Sig Dispense Refill  . allopurinol (ZYLOPRIM) 100 MG tablet Take 1 tablet by mouth Twice daily.      Marland Kitchen atenolol (TENORMIN) 50 MG tablet Take 1.5 tablets (75 mg total) by mouth daily.  90 tablet  3  . calcium carbonate (OS-CAL) 600 MG TABS Take 600 mg by mouth 2 (two) times daily with a meal.        . COLCRYS 0.6 MG tablet Take 1 tablet by mouth Twice daily.      . fenofibrate 160 MG tablet Take 1 tablet by mouth daily.      . fish oil-omega-3 fatty acids 1000 MG capsule Take 1 g by mouth 2 (two) times daily.        . furosemide (LASIX) 20 MG tablet Take 1 tablet (20 mg total) by mouth daily.  90 tablet  3  . glipiZIDE (GLUCOTROL XL) 2.5 MG 24 hr tablet       . levothyroxine (SYNTHROID, LEVOTHROID) 100 MCG tablet Take 112 tablets by mouth daily.       Marland Kitchen losartan (COZAAR) 25 MG tablet Take 12.5 mg by mouth daily.       . magnesium oxide (MAG-OX) 400 MG tablet Take 400 mg by mouth daily.      Marland Kitchen omeprazole (PRILOSEC) 20 MG capsule Take 1 capsule (20 mg total) by mouth 2 (two)  times daily.  60 capsule  11  . potassium chloride (K-DUR) 10 MEQ tablet Take 10 mEq by mouth daily.       . Probiotic Product (ALIGN PO) Take 4 mg by mouth.        . sitaGLIPtin (JANUVIA) 50 MG tablet Take 50 mg by mouth daily.        Marland Kitchen zolpidem (AMBIEN) 10 MG tablet Take 1 tablet by mouth At bedtime as needed.       No current facility-administered medications for this visit.     Past Surgical History  Procedure Laterality Date  . Asd repair  Age [facility]  . Right common femoral endarterectomy  2007  . Left parathyroidectomy  2007  . Parathyroid adenoma  2007  . Left hemithyroidectomy    . Tonsillectomy    . Hemorrhoid surgery  2010  . Colonoscopy  03/04/2010    anal canal hemorrhoids otherwise normal. TI normal. Bx showed lymphocytic colitis  . Esophagogastroduodenoscopy  03/04/2010    noncritical appearing Schatzki ring. SB bx negative  . Elas   06-01-11    Right saphenous ELAS      Allergies  Allergen Reactions  . Hydrocodone-Acetaminophen   . Ibuprofen   . Oxycodone Hcl   . Penicillins       Family History  Problem Relation Age of Onset  . Stroke Mother   . Lung cancer Father      Social History [name] reports that she has never smoked. She has never used smokeless tobacco. [name] reports that she does not drink alcohol.   Review of Systems CONSTITUTIONAL: No weight loss, fever, chills, weakness or fatigue.  HEENT: Eyes: No visual loss, blurred vision, double vision or yellow sclerae.No hearing loss, sneezing, congestion, runny nose or sore throat.  SKIN: No rash or itching.  CARDIOVASCULAR: per HPI RESPIRATORY: No shortness of breath, cough or sputum.  GASTROINTESTINAL: No anorexia, nausea, vomiting or diarrhea. No abdominal pain or blood.  GENITOURINARY: No burning on urination, no polyuria NEUROLOGICAL: No headache, dizziness, syncope, paralysis, ataxia, numbness or tingling in the extremities. No change in bowel or bladder control.  MUSCULOSKELETAL: No muscle, back pain, joint pain or stiffness.  LYMPHATICS: No enlarged nodes. No history of splenectomy.  PSYCHIATRIC: No history of depression or anxiety.  ENDOCRINOLOGIC: No reports of sweating, cold or heat intolerance. No polyuria or polydipsia.  Marland Kitchen   Physical Examination Filed Vitals:   07/15/13 1255  BP: 116/80  Pulse: 102   Filed Weights   07/15/13 1255  Weight: 154 lb (69.854 kg)    Gen: resting comfortably, no acute distress HEENT: no scleral icterus, pupils equal round and reactive, no palptable cervical adenopathy,  CV: RRR, no m/r/g, no JVD< no carotid bruits Resp: Clear to auscultation bilaterally GI: abdomen is soft, non-tender, non-distended, normal bowel sounds, no hepatosplenomegaly MSK: extremities are warm, no edema.  Skin: warm, no rash Neuro:  no focal deficits Psych: appropriate affect   Diagnostic  Studies 04/30/13 Clinic EKG: sinus brady, incomplete RBB, normal axis, PAC, inferior Q waves   05/05/2013 Echo: LVEF 60-65%, moderate LVH, no WMAs, elevated left atrial pressure     Assessment and Plan  1. Palpitations  - history of paroxysmal SVT previously well controlled on atenolol, from prior notes described as a long RP SVT.  - increased symptoms over the last several weeks. Of note prior exacerbation related to too much thyroid medication. Her most recent thyroid  study is normal  - 48 hour holter monitor shows short episodes of SVT, looks most consistent with atach.  - will increase atenolol to 100mg  daily, she has a low normal resting pulse rate.    2. DOE  - unclear etiology, seems to have some symptoms separate from the palpitations. - did not improve with diuretic started int he setting of elevated LA pressure on echo - will check a lexiscan MPI to evaluate for ischemia given her multiple CAD risk factors including diabetes, concern for possible anginal equivalent. Concern for exercising as she reports episode of severely elevated heart rates before on treadmill, would be worried about reoccurence if had to hold atenolol for exercise stress test.     Follow up 1 month     [name], M.D., F.A.C.C.

## 2013-07-15 NOTE — Patient Instructions (Signed)
Your physician recommends that you schedule a follow-up appointment in: [place] has requested that you have a lexiscan myoview. For further information please visit [email or web address]. Please follow instruction sheet, as given.  WE WILL CALL YOU WITH YOUR TEST RESULTS/INSTRUCTIONS/NEXT STEPS ONCE RECEIVED BY THE PROVIDER   Your physician has recommended you make the following change in your medication:   1) INCREASE ATENOLOL TO 100MG  ONCE DAILY

## 2013-07-25 ENCOUNTER — Encounter (HOSPITAL_COMMUNITY): Payer: Self-pay

## 2013-07-25 ENCOUNTER — Encounter (HOSPITAL_COMMUNITY)
Admission: RE | Admit: 2013-07-25 | Discharge: 2013-07-25 | Disposition: A | Discharge status: Home / Self Care | Payer: Medicare Other | Source: Ambulatory Visit | Attending: Cardiology | Admitting: Cardiology

## 2013-07-25 DIAGNOSIS — R0609 Other forms of dyspnea: Secondary | ICD-10-CM | POA: Insufficient documentation

## 2013-07-25 DIAGNOSIS — R0600 Dyspnea, unspecified: Secondary | ICD-10-CM

## 2013-07-25 DIAGNOSIS — R0989 Other specified symptoms and signs involving the circulatory and respiratory systems: Principal | ICD-10-CM | POA: Insufficient documentation

## 2013-07-25 DIAGNOSIS — R0602 Shortness of breath: Secondary | ICD-10-CM

## 2013-07-25 MED ORDER — TECHNETIUM TC 99M SESTAMIBI GENERIC - CARDIOLITE
10.0000 | Freq: Once | INTRAVENOUS | Status: AC | PRN
  Administered 2013-07-25: 10 via INTRAVENOUS

## 2013-07-25 MED ORDER — REGADENOSON 0.4 MG/5ML IV SOLN
INTRAVENOUS | Status: AC
  Administered 2013-07-25: 0.4 mg via INTRAVENOUS
  Filled 2013-07-25: qty 5

## 2013-07-25 MED ORDER — SODIUM CHLORIDE 0.9 % IJ SOLN
INTRAMUSCULAR | Status: AC
  Administered 2013-07-25: 10 mL via INTRAVENOUS
  Filled 2013-07-25: qty 10

## 2013-07-25 MED ORDER — TECHNETIUM TC 99M SESTAMIBI - CARDIOLITE
30.0000 | Freq: Once | INTRAVENOUS | Status: AC | PRN
  Administered 2013-07-25: 30 via INTRAVENOUS

## 2013-07-25 NOTE — Progress Notes (Signed)
Stress Lab Nurses Notes - [name]  [name] 07/25/2013 Reason for doing test: Dyspnea & Palpitation Type of test: Wille Glaser Nurse performing test: Gerrit Halls, RN Nuclear Medicine Tech: Redmond Baseman Echo Tech: Not Applicable MD performing test: Branch/K.Lawrence NP Family MD: [place] Crane Test explained and consent signed: yes IV started: 22g jelco, Saline lock flushed, No redness or edema and Saline lock started in radiology Symptoms: Dizziness & Stomach discomfort Treatment/Intervention: None Reason test stopped: protocol completed After recovery IV was: Discontinued via X-ray tech and No redness or edema Patient to return to [facility]. Med at : 12:00 Patient discharged: Home Patient's Condition upon discharge was: stable Comments: During stress test BP 94/40 & HR 141.  Recovery BP 110/60 & HR 108.  Symptoms resolved in recovery. [name] T

## 2013-07-29 ENCOUNTER — Encounter: Payer: Self-pay | Admitting: Cardiology

## 2013-07-29 ENCOUNTER — Ambulatory Visit (INDEPENDENT_AMBULATORY_CARE_PROVIDER_SITE_OTHER): Payer: Medicare Other | Admitting: Cardiology

## 2013-07-29 VITALS — BP 123/87 | HR 112 | Ht <= 58 in | Wt 157.8 lb

## 2013-07-29 DIAGNOSIS — R002 Palpitations: Secondary | ICD-10-CM

## 2013-07-29 DIAGNOSIS — I4891 Unspecified atrial fibrillation: Secondary | ICD-10-CM

## 2013-07-29 MED ORDER — APIXABAN 5 MG PO TABS: 5.0000 mg | ORAL_TABLET | Freq: Two times a day (BID) | ORAL | Status: DC

## 2013-07-29 MED ORDER — METOPROLOL TARTRATE 50 MG PO TABS: 50.0000 mg | ORAL_TABLET | Freq: Two times a day (BID) | ORAL | Status: DC

## 2013-07-29 NOTE — Progress Notes (Signed)
Clinical Summary [name] is a 58 y.o.female seen today for follow up of the following medical problems.  1. Paroxysmal SVT  - prior admission in setting of GI bleed and iatrogenic hyperthyroidism for SVT  - per notes long RP tachycardica. Normal echo at that time, started on atenolol and dilt. Reports at some point dilt was stopped, has been only on atenolol  - notes some palpitations that occur daily over the last few weeks. Feeling of heart pounding, feels SOB, feels lightheaded. Episodes last 5 minutes, often brought on by exertion.  - compliant with atenolol.  - no coffee, drinks 4 liters of diet coke a week, drinks a lot of cold tea. No alcohol  - since last visit denies any change in her symptoms. She completed her blood work, echo and 48 hr holter monitor which showed short episodes of SVT  - last visit increased atenolol to 75 mg, without much change in symptoms.   - no change in symptoms since increasing atenolol. Still w/ SOB and palpitations  2. DOE - typically occurs with exertion. Example walking up steps she gets very fatigued and SOB. No chest pain. Reports this has worsened over the last few months  - no improvement after adding diuretic last visit, echo showed normal LV function with evidence of diastolic dysfunction.  - referred for Lexiscan nuclear stress test, showed afib which was a new finding and small area of ischemia at the apex.    Past Medical History  Diagnosis Date  . Microscopic colitis 9/11    Colonoscopy  . Hemorrhoids   . Diverticulosis   . Schatzki's ring     Last EGD with esophageal dilatation 65F  9/11  . Congenital heart disease   . Paroxysmal SVT (supraventricular tachycardia)   . IDA (iron deficiency anemia)   . GERD (gastroesophageal reflux disease)   . Hypothyroidism   . Hypertension   . Hyperlipidemia   . Renal insufficiency   . Gout   . PVD (peripheral vascular disease)   . Hyperparathyroidism   . Scoliosis   . Chronic  bronchitis   . Varicose veins right leg pain and swelling     Allergies  Allergen Reactions  . Hydrocodone-Acetaminophen   . Ibuprofen   . Oxycodone Hcl   . Penicillins      Current Outpatient Prescriptions  Medication Sig Dispense Refill  . allopurinol (ZYLOPRIM) 100 MG tablet Take 1 tablet by mouth Twice daily.      Marland Kitchen atenolol (TENORMIN) 100 MG tablet Take 1 tablet (100 mg total) by mouth daily.  90 tablet  3  . calcium carbonate (OS-CAL) 600 MG TABS Take 600 mg by mouth 2 (two) times daily with a meal.        . COLCRYS 0.6 MG tablet Take 1 tablet by mouth Twice daily.      . fenofibrate 160 MG tablet Take 1 tablet by mouth daily.      . fish oil-omega-3 fatty acids 1000 MG capsule Take 1 g by mouth 2 (two) times daily.        . furosemide (LASIX) 20 MG tablet Take 1 tablet (20 mg total) by mouth daily.  90 tablet  3  . glipiZIDE (GLUCOTROL XL) 2.5 MG 24 hr tablet       . levothyroxine (SYNTHROID, LEVOTHROID) 100 MCG tablet Take 112 tablets by mouth daily.       Marland Kitchen losartan (COZAAR) 25 MG tablet Take 12.5 mg by mouth daily.       Marland Kitchen  magnesium oxide (MAG-OX) 400 MG tablet Take 400 mg by mouth daily.      Marland Kitchen omeprazole (PRILOSEC) 20 MG capsule Take 1 capsule (20 mg total) by mouth 2 (two) times daily.  60 capsule  11  . potassium chloride (K-DUR) 10 MEQ tablet Take 10 mEq by mouth daily.       . Probiotic Product (ALIGN PO) Take 4 mg by mouth.        . sitaGLIPtin (JANUVIA) 50 MG tablet Take 50 mg by mouth daily.        Marland Kitchen zolpidem (AMBIEN) 10 MG tablet Take 1 tablet by mouth At bedtime as needed.       No current facility-administered medications for this visit.     Past Surgical History  Procedure Laterality Date  . Asd repair  Age [facility]  . Right common femoral endarterectomy  2007  . Left parathyroidectomy  2007  . Parathyroid adenoma  2007  . Left hemithyroidectomy    . Tonsillectomy    . Hemorrhoid surgery  2010  .  Colonoscopy  03/04/2010    anal canal hemorrhoids otherwise normal. TI normal. Bx showed lymphocytic colitis  . Esophagogastroduodenoscopy  03/04/2010    noncritical appearing Schatzki ring. SB bx negative  . Elas  06-01-11    Right saphenous ELAS      Allergies  Allergen Reactions  . Hydrocodone-Acetaminophen   . Ibuprofen   . Oxycodone Hcl   . Penicillins       Family History  Problem Relation Age of Onset  . Stroke Mother   . Lung cancer Father      Social History [name] reports that she has never smoked. She has never used smokeless tobacco. [name] reports that she does not drink alcohol.   Review of Systems CONSTITUTIONAL: No weight loss, fever, chills, weakness or fatigue.  HEENT: Eyes: No visual loss, blurred vision, double vision or yellow sclerae.No hearing loss, sneezing, congestion, runny nose or sore throat.  SKIN: No rash or itching.  CARDIOVASCULAR: per HPI RESPIRATORY: No shortness of breath, cough or sputum.  GASTROINTESTINAL: No anorexia, nausea, vomiting or diarrhea. No abdominal pain or blood.  GENITOURINARY: No burning on urination, no polyuria NEUROLOGICAL: No headache, dizziness, syncope, paralysis, ataxia, numbness or tingling in the extremities. No change in bowel or bladder control.  MUSCULOSKELETAL: No muscle, back pain, joint pain or stiffness.  LYMPHATICS: No enlarged nodes. No history of splenectomy.  PSYCHIATRIC: No history of depression or anxiety.  ENDOCRINOLOGIC: No reports of sweating, cold or heat intolerance. No polyuria or polydipsia.  Marland Kitchen   Physical Examination Filed Vitals:   07/29/13 1529  BP: 123/87  Pulse: 112   Filed Weights   07/29/13 1529  Weight: 157 lb 12.8 oz (71.578 kg)    Gen: resting comfortably, no acute distress HEENT: no scleral icterus, pupils equal round and reactive, no palptable cervical adenopathy,  CV: irreg, no m/r/g, no JVD, no carotid bruits Resp: Clear to auscultation bilaterally GI:  abdomen is soft, non-tender, non-distended, normal bowel sounds, no hepatosplenomegaly MSK: extremities are warm, no edema.  Skin: warm, no rash Neuro:  no focal deficits Psych: appropriate affect   Diagnostic Studies  04/30/13 Clinic EKG: sinus brady, incomplete RBB, normal axis, PAC, inferior Q waves   05/05/2013 Echo: LVEF 60-65%, moderate LVH, no WMAs, elevated left atrial pressure  07/25/13 Lexiscan Nuclear Stress Test IMPRESSION: 1. Abnormal Lexiscan MPI with evidence of small area of  ischemia in the distal latera wall/apex  2. Technically difficult gated images, LVEF appears normal at 47%  3. Atrial fibrillation was noted on the EKG tracings, from records this does not appear to be a prior diagnosis for this patient  4. Overall low risk study for major cardiac events, there is a small are of myocardium at jeopardy    Assessment and Plan   1. Afib - new diagnosis for the patient, suspect this could be the etiology of her fatigue, SOB, and palpitations though recent monitor did not show significantly sustained episodes of elevated rates. She has an abnormal Lexiscan that is low risk with only small area of ischemia at the apex I think is less likely the etiology - she has been on rate control strategy with atenolol, will change to metoprolol for more potent control. I suspect she may be very sensitive to being in afib, and thus will refer her to EP clinic for evaluation for possible rhythm control strategy (note she has normal LVEF with small area of ischemia on Lexiscan). - her CHADS2Vasc score is 3, will start eliquis 5mg  bid for stroke prophylaxis.   2. Abnormal Lexiscan - small area of ischemia at apex, normal LVEF by echo. She does not complain of chest pain, primarily fatigue and palpitations. - will medically manage at this time, she is not on ASA b/c of anticoagulation. Continue beta blocker and ARB, we will need to send a lipid panel.       [name],  M.D., F.A.C.C.

## 2013-07-29 NOTE — Patient Instructions (Signed)
Your physician recommends that you schedule a follow-up appointment in: 3 months with Dr. Harl Bowie. This appointment will be scheduled today before you leave.  Your physician has recommended you make the following change in your medication:  Stop: Atenolol Start: Metoprolol 50 MG take 1 tablet by mouth twice daily Start: Eliquis 5 MG take 1 tablet by mouth twice daily.  Continue all other medications the same.   Your physician has referred you to Dr. Lovena Le in [place] office.

## 2013-08-05 DIAGNOSIS — I4891 Unspecified atrial fibrillation: Secondary | ICD-10-CM | POA: Insufficient documentation

## 2013-08-06 ENCOUNTER — Encounter: Payer: Self-pay | Admitting: Internal Medicine

## 2013-08-06 ENCOUNTER — Ambulatory Visit (INDEPENDENT_AMBULATORY_CARE_PROVIDER_SITE_OTHER): Payer: Medicare Other | Admitting: Internal Medicine

## 2013-08-06 VITALS — BP 123/87 | HR 98 | Ht <= 58 in | Wt 154.0 lb

## 2013-08-06 DIAGNOSIS — I4891 Unspecified atrial fibrillation: Secondary | ICD-10-CM

## 2013-08-06 MED ORDER — FLECAINIDE ACETATE 50 MG PO TABS: 50.0000 mg | ORAL_TABLET | Freq: Two times a day (BID) | ORAL | Status: DC

## 2013-08-06 NOTE — Assessment & Plan Note (Signed)
We discussed the treatment options in detail. I have recommended she try flecainide. We will start at low dose and attempt to uptitrate as tolerated/needed. Will plan stress test once we get to optimal dose.

## 2013-08-06 NOTE — Progress Notes (Signed)
HPI [name] is referred today by Dr. Harl Bowie for evaluation of atrial fibrillation. The patient is a very pleasant 58 yo woman with a h/o PAF, HTN, and peripheral vascular disease. The patient also had DM. She has had a h/o palpitations for years which have increased in frequency. She was found to have both a long RP tachycardia (likely atrial tachy) and PAF. No syncope. She also has chronic dyspnea. She denies claudication. A recent stress test was considered low risk.  Allergies  Allergen Reactions  . Hydrocodone-Acetaminophen   . Ibuprofen   . Oxycodone Hcl   . Penicillins      Current Outpatient Prescriptions  Medication Sig Dispense Refill  . acetaminophen (TYLENOL) 500 MG tablet Take 500 mg by mouth every 6 (six) hours as needed.      Marland Kitchen allopurinol (ZYLOPRIM) 100 MG tablet Take 1 tablet by mouth Twice daily.      Marland Kitchen apixaban (ELIQUIS) 5 MG TABS tablet Take 1 tablet (5 mg total) by mouth 2 (two) times daily.  60 tablet  6  . calcium carbonate (OS-CAL) 600 MG TABS Take 600 mg by mouth 2 (two) times daily with a meal.        . cholecalciferol (VITAMIN D) 1000 UNITS tablet Take 1,000 Units by mouth daily.      Marland Kitchen COLCRYS 0.6 MG tablet Take 1 tablet by mouth Twice daily.      . fenofibrate 160 MG tablet Take 1 tablet by mouth daily.      . fish oil-omega-3 fatty acids 1000 MG capsule Take 1 g by mouth 2 (two) times daily.        . furosemide (LASIX) 20 MG tablet Take 1 tablet (20 mg total) by mouth daily.  90 tablet  3  . glipiZIDE (GLUCOTROL XL) 2.5 MG 24 hr tablet       . levothyroxine (SYNTHROID, LEVOTHROID) 112 MCG tablet Take 112 mcg by mouth daily before breakfast.      . loperamide (IMODIUM A-D) 2 MG tablet Take 2 mg by mouth 4 (four) times daily as needed for diarrhea or loose stools.      Marland Kitchen losartan (COZAAR) 25 MG tablet Take 12.5 mg by mouth daily.       . Magnesium 250 MG TABS Take by mouth daily.      . metoprolol (LOPRESSOR) 50 MG tablet Take 1 tablet (50 mg total)  by mouth 2 (two) times daily.  60 tablet  6  . omeprazole (PRILOSEC) 20 MG capsule Take 1 capsule (20 mg total) by mouth 2 (two) times daily.  60 capsule  11  . potassium chloride (K-DUR) 10 MEQ tablet Take 10 mEq by mouth daily.       . Probiotic Product (ALIGN PO) Take 4 mg by mouth.        . sitaGLIPtin (JANUVIA) 50 MG tablet Take 50 mg by mouth daily.        Marland Kitchen zolpidem (AMBIEN) 10 MG tablet Take 1 tablet by mouth At bedtime as needed.      . flecainide (TAMBOCOR) 50 MG tablet Take 1 tablet (50 mg total) by mouth 2 (two) times daily.  60 tablet  3   No current facility-administered medications for this visit.     Past Medical History  Diagnosis Date  . Microscopic colitis 9/11    Colonoscopy  . Hemorrhoids   . Diverticulosis   . Schatzki's ring     Last EGD with esophageal dilatation  107F  9/11  . Congenital heart disease   . Paroxysmal SVT (supraventricular tachycardia)   . IDA (iron deficiency anemia)   . GERD (gastroesophageal reflux disease)   . Hypothyroidism   . Hypertension   . Hyperlipidemia   . Renal insufficiency   . Gout   . PVD (peripheral vascular disease)   . Hyperparathyroidism   . Scoliosis   . Chronic bronchitis   . Varicose veins right leg pain and swelling    ROS:   All systems reviewed and negative except as noted in the HPI.   Past Surgical History  Procedure Laterality Date  . Asd repair  Age [facility]  . Right common femoral endarterectomy  2007  . Left parathyroidectomy  2007  . Parathyroid adenoma  2007  . Left hemithyroidectomy    . Tonsillectomy    . Hemorrhoid surgery  2010  . Colonoscopy  03/04/2010    anal canal hemorrhoids otherwise normal. TI normal. Bx showed lymphocytic colitis  . Esophagogastroduodenoscopy  03/04/2010    noncritical appearing Schatzki ring. SB bx negative  . Elas  06-01-11    Right saphenous ELAS      Family History  Problem Relation Age of Onset  . Stroke Mother     . Lung cancer Father      History   Social History  . Marital Status: Single    Spouse Name: N/A    Number of Children: N/A  . Years of Education: N/A   Occupational History  . Not on file.   Social History Main Topics  . Smoking status: Never Smoker   . Smokeless tobacco: Never Used  . Alcohol Use: No  . Drug Use: No  . Sexual Activity: No   Other Topics Concern  . Not on file   Social History Narrative  . No narrative on file     BP 123/87  Pulse 98  Ht 4\' 10"  (1.473 m)  Wt 154 lb (69.854 kg)  BMI 32.19 kg/m2  Physical Exam:  Well appearing middle aged woman,NAD HEENT: Unremarkable Neck:  7 cm JVD, no thyromegally Back:  No CVA tenderness Lungs:  Clear with no wheezes HEART:  Regular rate rhythm, no murmurs, no rubs, no clicks Abd:  soft, positive bowel sounds, no organomegally, no rebound, no guarding Ext:  2 plus pulses, no edema, no cyanosis, no clubbing Skin:  No rashes no nodules Neuro:  CN II through XII intact, motor grossly intact  EKG - nsr  Assess/Plan:

## 2013-08-06 NOTE — Patient Instructions (Addendum)
Your physician recommends that you schedule a follow-up appointment in:  2 months with Dr Lovena Le and a nurse visit in 2 weeks for an EKG  Your physician has recommended you make the following change in your medication 1. Start Flecainide 50 mg twice a day

## 2013-08-07 ENCOUNTER — Telehealth: Payer: Self-pay | Admitting: Internal Medicine

## 2013-08-07 NOTE — Telephone Encounter (Signed)
Please return patient's call regarding new RX from visit with Dr.Taylor on 2/11/tgs**

## 2013-08-07 NOTE — Telephone Encounter (Signed)
Pt noted that she has been noting dizzyness that causes her to take her time walking from place to place as well as changing positions, pt advised the dizzyness is better with sitting down, pt noting her heart is racing when she moves around 'alot" such as walking from one end of the building to the other, pt denies uneven heart beats but notes fluttering when she walks around only, pt complaining her vision is blurry for a few minutes and then passes on, pt noted all sxs started an hour after she took the first pill, pt only took one of these pills per scared to take again, pt also felt faint, pt educated on side effects, pt advised that Dr. Cristopher Peru is out of the office today we will call her once he advises, pt understood

## 2013-08-07 NOTE — Telephone Encounter (Signed)
.  left message to have patient return my call per noted pt advised to start flecanide 50mg  BID

## 2013-08-08 NOTE — Telephone Encounter (Signed)
Dr Lovena Le says okay to stop

## 2013-08-08 NOTE — Telephone Encounter (Signed)
Spoke to pt to advise results/instructions. Pt understood. Pt notes she did stop and is feeling better than she did, pt will keep her follow up visits

## 2013-08-13 ENCOUNTER — Telehealth: Payer: Self-pay | Admitting: Cardiology

## 2013-08-13 MED ORDER — APIXABAN 5 MG PO TABS: 5.0000 mg | ORAL_TABLET | Freq: Two times a day (BID) | ORAL | Status: DC

## 2013-08-13 NOTE — Telephone Encounter (Signed)
Patient needs RX for Eliquis faxed to [facility] / tgs

## 2013-08-13 NOTE — Telephone Encounter (Signed)
Medication sent via escribe.  

## 2013-08-15 ENCOUNTER — Telehealth: Payer: Self-pay | Admitting: *Deleted

## 2013-08-15 MED ORDER — RIVAROXABAN 20 MG PO TABS: 20.0000 mg | ORAL_TABLET | Freq: Every day | ORAL | Status: DC

## 2013-08-15 NOTE — Telephone Encounter (Signed)
Spoke to pt. Pt states that she wanted to try Xarelto 20 mg daily. Will send it to [facility]

## 2013-08-15 NOTE — Telephone Encounter (Signed)
Medication sent via escribe.  

## 2013-08-15 NOTE — Telephone Encounter (Signed)
Received Fax for alternatives for Eliquis. She can not afford copay of $80. Spoke with Dr Lovena Le, which advised Xarelto 20 mg daily, Co Pay will be $40 dollars. Called and left message for pt to clarify if this med change was ok with pt.

## 2013-08-21 ENCOUNTER — Ambulatory Visit: Payer: Medicare Other | Admitting: Internal Medicine

## 2013-08-25 ENCOUNTER — Ambulatory Visit (INDEPENDENT_AMBULATORY_CARE_PROVIDER_SITE_OTHER): Payer: Medicare Other | Admitting: Cardiology

## 2013-08-25 ENCOUNTER — Encounter (INDEPENDENT_AMBULATORY_CARE_PROVIDER_SITE_OTHER): Payer: Self-pay

## 2013-08-25 ENCOUNTER — Encounter: Payer: Self-pay | Admitting: Cardiology

## 2013-08-25 VITALS — BP 107/78 | HR 101 | Ht <= 58 in | Wt 155.1 lb

## 2013-08-25 DIAGNOSIS — I4891 Unspecified atrial fibrillation: Secondary | ICD-10-CM

## 2013-08-25 DIAGNOSIS — I10 Essential (primary) hypertension: Secondary | ICD-10-CM

## 2013-08-25 MED ORDER — RIVAROXABAN 20 MG PO TABS: 20.0000 mg | ORAL_TABLET | Freq: Every day | ORAL | Status: DC

## 2013-08-25 NOTE — Patient Instructions (Addendum)
Your physician recommends that you schedule a follow-up appointment in: 4 months   Your physician has recommended you make the following change in your medication:   1. STOP Eliquis   2. START Xarelto 20 mg daily   Please have blood work done (BMET,GFR)  Please see Dr.Taylor sooner than 10/03/13 apt

## 2013-08-25 NOTE — Progress Notes (Signed)
Clinical Summary [name] is a 58 y.o.female  [name] is a 58 y.o.female seen today as a focused visit on her history of paroxysmal afib.   1. Paroxysmal Afib - new diagnosis of afib, noted during stress test. Prior history of long RP tachycardia likely atach.  - she is fairly symptomatic with episodes and has not responded to rate control, she was referred to EP for assistance with rhythm control - started on flecanide, she states she took it once and after that her palpitations were much worst, and she stopped taking.  - she had been started on eliquis, but ran out of samples and the cost was too much to refill. She was given a Rx for xarelto which is cheaper for her, but has not filled yet   - since last visit denies any change in her symptoms. She completed her blood work, echo and 48 hr holter monitor which showed short episodes of SVT  - last visit increased atenolol to 75 mg, without much change in symptoms.  - no change in symptoms since increasing atenolol. Still w/ SOB and palpitations   Past Medical History  Diagnosis Date  . Microscopic colitis 9/11    Colonoscopy  . Hemorrhoids   . Diverticulosis   . Schatzki's ring     Last EGD with esophageal dilatation 62F  9/11  . Congenital heart disease   . Paroxysmal SVT (supraventricular tachycardia)   . IDA (iron deficiency anemia)   . GERD (gastroesophageal reflux disease)   . Hypothyroidism   . Hypertension   . Hyperlipidemia   . Renal insufficiency   . Gout   . PVD (peripheral vascular disease)   . Hyperparathyroidism   . Scoliosis   . Chronic bronchitis   . Varicose veins right leg pain and swelling     Allergies  Allergen Reactions  . Hydrocodone-Acetaminophen   . Ibuprofen   . Oxycodone Hcl   . Penicillins      Current Outpatient Prescriptions  Medication Sig Dispense Refill  . acetaminophen (TYLENOL) 500 MG tablet Take 500 mg by mouth every 6 (six) hours as needed.      Marland Kitchen allopurinol  (ZYLOPRIM) 100 MG tablet Take 1 tablet by mouth Twice daily.      . calcium carbonate (OS-CAL) 600 MG TABS Take 600 mg by mouth 2 (two) times daily with a meal.        . cholecalciferol (VITAMIN D) 1000 UNITS tablet Take 1,000 Units by mouth daily.      Marland Kitchen COLCRYS 0.6 MG tablet Take 1 tablet by mouth Twice daily.      . fenofibrate 160 MG tablet Take 1 tablet by mouth daily.      . fish oil-omega-3 fatty acids 1000 MG capsule Take 1 g by mouth 2 (two) times daily.        . flecainide (TAMBOCOR) 50 MG tablet Take 1 tablet (50 mg total) by mouth 2 (two) times daily.  60 tablet  3  . furosemide (LASIX) 20 MG tablet Take 1 tablet (20 mg total) by mouth daily.  90 tablet  3  . glipiZIDE (GLUCOTROL XL) 2.5 MG 24 hr tablet       . levothyroxine (SYNTHROID, LEVOTHROID) 112 MCG tablet Take 112 mcg by mouth daily before breakfast.      . loperamide (IMODIUM A-D) 2 MG tablet Take 2 mg by mouth 4 (four) times daily as needed for diarrhea or loose stools.      Marland Kitchen  losartan (COZAAR) 25 MG tablet Take 12.5 mg by mouth daily.       . Magnesium 250 MG TABS Take by mouth daily.      . metoprolol (LOPRESSOR) 50 MG tablet Take 1 tablet (50 mg total) by mouth 2 (two) times daily.  60 tablet  6  . omeprazole (PRILOSEC) 20 MG capsule Take 1 capsule (20 mg total) by mouth 2 (two) times daily.  60 capsule  11  . potassium chloride (K-DUR) 10 MEQ tablet Take 10 mEq by mouth daily.       . Probiotic Product (ALIGN PO) Take 4 mg by mouth.        . Rivaroxaban (XARELTO) 20 MG TABS tablet Take 1 tablet (20 mg total) by mouth daily with supper.  30 tablet  6  . sitaGLIPtin (JANUVIA) 50 MG tablet Take 50 mg by mouth daily.        Marland Kitchen zolpidem (AMBIEN) 10 MG tablet Take 1 tablet by mouth At bedtime as needed.       No current facility-administered medications for this visit.     Past Surgical History  Procedure Laterality Date  . Asd repair  Age [facility]  . Right common femoral  endarterectomy  2007  . Left parathyroidectomy  2007  . Parathyroid adenoma  2007  . Left hemithyroidectomy    . Tonsillectomy    . Hemorrhoid surgery  2010  . Colonoscopy  03/04/2010    anal canal hemorrhoids otherwise normal. TI normal. Bx showed lymphocytic colitis  . Esophagogastroduodenoscopy  03/04/2010    noncritical appearing Schatzki ring. SB bx negative  . Elas  06-01-11    Right saphenous ELAS      Allergies  Allergen Reactions  . Hydrocodone-Acetaminophen   . Ibuprofen   . Oxycodone Hcl   . Penicillins       Family History  Problem Relation Age of Onset  . Stroke Mother   . Lung cancer Father      Social History [name] reports that she has never smoked. She has never used smokeless tobacco. [name] reports that she does not drink alcohol.   Review of Systems CONSTITUTIONAL: No weight loss, fever, chills, weakness or fatigue.  HEENT: Eyes: No visual loss, blurred vision, double vision or yellow sclerae.No hearing loss, sneezing, congestion, runny nose or sore throat.  SKIN: No rash or itching.  CARDIOVASCULAR: per HPI RESPIRATORY: No shortness of breath, cough or sputum.  GASTROINTESTINAL: No anorexia, nausea, vomiting or diarrhea. No abdominal pain or blood.  GENITOURINARY: No burning on urination, no polyuria NEUROLOGICAL: No headache, dizziness, syncope, paralysis, ataxia, numbness or tingling in the extremities. No change in bowel or bladder control.  MUSCULOSKELETAL: No muscle, back pain, joint pain or stiffness.  LYMPHATICS: No enlarged nodes. No history of splenectomy.  PSYCHIATRIC: No history of depression or anxiety.  ENDOCRINOLOGIC: No reports of sweating, cold or heat intolerance. No polyuria or polydipsia.  Marland Kitchen   Physical Examination p 101 bp 107/78 Wt 155 lbs BMI 32 Gen: resting comfortably, no acute distress HEENT: no scleral icterus, pupils equal round and reactive, no palptable cervical adenopathy,  CV: irreg, no m/r/g, no JVD, no  carotid bruits Resp: Clear to auscultation bilaterally GI: abdomen is soft, non-tender, non-distended, normal bowel sounds, no hepatosplenomegaly MSK: extremities are warm, no edema.  Skin: warm, no rash Neuro:  no focal deficits Psych: appropriate affect   Diagnostic Studies 04/30/13 Clinic EKG: sinus brady,  incomplete RBB, normal axis, PAC, inferior Q waves   05/05/2013 Echo: LVEF 60-65%, moderate LVH, no WMAs, elevated left atrial pressure   07/25/13 Lexiscan Nuclear Stress Test  IMPRESSION: 1. Abnormal Lexiscan MPI with evidence of small area of ischemia in the distal latera wall/apex  2. Technically difficult gated images, LVEF appears normal at 47%  3. Atrial fibrillation was noted on the EKG tracings, from records this does not appear to be a prior diagnosis for this patient  4. Overall low risk study for major cardiac events, there is a small are of myocardium at jeopardy  08/25/13 Clinic EKG Afib, rate 100     Assessment and Plan  1. Paroxymal afib  - continued symptoms on prior rate control regimen of both atenolol at one time and then metoprolol - seen by EP, started on flecanide but only took one dose, had worst palpitations and has not taken since. She did not notify our office. - will have her follow up with EP for possible alternative antiarrythmic, continue metoprolol - xarelto is cheaper for her than eliquis, will stop eliquis and give samples of xarelto. Repeat BMET with GFR to clarify dosing.  - her CHADS2Vasc score is 3  2. Abnormal Lexiscan  - small area of ischemia at apex, normal LVEF by echo. She does not complain of chest pain, primarily fatigue and palpitations.  - will medically manage at this time, she is not on ASA b/c of anticoagulation. Continue beta blocker and ARB, we will need to send a lipid panel.     Follow up 4 months   [name], M.D., F.A.C.C.

## 2013-08-26 ENCOUNTER — Other Ambulatory Visit: Payer: Self-pay | Admitting: Cardiology

## 2013-08-26 LAB — BASIC METABOLIC PANEL WITH GFR
BUN: 30 mg/dL — AB (ref 6–23)
CO2: 24 mEq/L (ref 19–32)
Calcium: 10.7 mg/dL — ABNORMAL HIGH (ref 8.4–10.5)
Chloride: 102 mEq/L (ref 96–112)
Creat: 1.76 mg/dL — ABNORMAL HIGH (ref 0.50–1.10)
GFR, EST AFRICAN AMERICAN: 36 mL/min — AB
GFR, EST NON AFRICAN AMERICAN: 32 mL/min — AB
Glucose, Bld: 110 mg/dL — ABNORMAL HIGH (ref 70–99)
POTASSIUM: 4.6 meq/L (ref 3.5–5.3)
SODIUM: 141 meq/L (ref 135–145)

## 2013-08-27 ENCOUNTER — Telehealth: Payer: Self-pay | Admitting: *Deleted

## 2013-08-27 MED ORDER — FUROSEMIDE 20 MG PO TABS: 20.0000 mg | ORAL_TABLET | ORAL | Status: DC | PRN

## 2013-08-27 NOTE — Telephone Encounter (Signed)
Message copied by Truett Mainland on Wed Aug 27, 2013  9:41 AM ------      Message from: [place] F      Created: Wed Aug 27, 2013  9:16 AM       Please let patient know she may start the xarelto 20mg  daily. Her labs show kidney function is progressively worsening, please have her change her lasix to only as needed for swelling or SOB. Please forward her most recent BMET  to her PCP, she needs to be seen soon for her worsening renal function.             Carlyle Dolly MD ------

## 2013-08-27 NOTE — Telephone Encounter (Signed)
Left message for pt to call back  °

## 2013-08-27 NOTE — Telephone Encounter (Signed)
Pt is made aware to start Xarelto and lasix as needed. BMET is sent to PCP.

## 2013-08-27 NOTE — Telephone Encounter (Signed)
Message copied by Truett Mainland on Wed Aug 27, 2013  3:38 PM ------      Message from: [place] F      Created: Wed Aug 27, 2013  9:16 AM       Please let patient know she may start the xarelto 20mg  daily. Her labs show kidney function is progressively worsening, please have her change her lasix to only as needed for swelling or SOB. Please forward her most recent BMET  to her PCP, she needs to be seen soon for her worsening renal function.             Carlyle Dolly MD ------

## 2013-09-04 ENCOUNTER — Telehealth: Payer: Self-pay | Admitting: *Deleted

## 2013-09-04 NOTE — Telephone Encounter (Signed)
PT was started on xerelto. She has some kidney problemes and a sore on her toe. States that she has been having blood in urine ( seen PCP ) and the sore on her toe has strted to bleed.

## 2013-09-05 NOTE — Telephone Encounter (Signed)
Disregard noted dated 09/05/13 5:26 pm, wrong patient

## 2013-09-05 NOTE — Telephone Encounter (Signed)
I spoke with Lennox Grumbles ,rep for Theda Sers is personally handle securing medication.

## 2013-09-08 NOTE — Telephone Encounter (Signed)
LM for [name] to call back

## 2013-09-08 NOTE — Telephone Encounter (Signed)
Spoke with pt, no active bleeding on tor noted,reassured that Xarelto doesn't cause the issues of bleeding as much as coumadin did (per Coumadin nurse L Reid)   Pt has apt with podiatrist tomorrow

## 2013-09-12 ENCOUNTER — Ambulatory Visit (INDEPENDENT_AMBULATORY_CARE_PROVIDER_SITE_OTHER): Payer: Medicare Other | Admitting: Internal Medicine

## 2013-09-12 ENCOUNTER — Other Ambulatory Visit (HOSPITAL_COMMUNITY): Payer: Self-pay | Admitting: Podiatry

## 2013-09-12 ENCOUNTER — Encounter (INDEPENDENT_AMBULATORY_CARE_PROVIDER_SITE_OTHER): Payer: Self-pay

## 2013-09-12 ENCOUNTER — Encounter: Payer: Self-pay | Admitting: Internal Medicine

## 2013-09-12 VITALS — BP 115/89 | HR 54 | Ht <= 58 in | Wt 151.0 lb

## 2013-09-12 DIAGNOSIS — L98499 Non-pressure chronic ulcer of skin of other sites with unspecified severity: Secondary | ICD-10-CM

## 2013-09-12 DIAGNOSIS — I739 Peripheral vascular disease, unspecified: Secondary | ICD-10-CM

## 2013-09-12 DIAGNOSIS — I4891 Unspecified atrial fibrillation: Secondary | ICD-10-CM

## 2013-09-12 MED ORDER — METOPROLOL TARTRATE 50 MG PO TABS: 75.0000 mg | ORAL_TABLET | Freq: Two times a day (BID) | ORAL | Status: DC

## 2013-09-12 NOTE — Patient Instructions (Addendum)
Your physician recommends that you schedule a follow-up appointment in:6 months with Dr Knox Saliva will receive a reminder letter two months in advance reminding you to call and schedule your appointment. If you don't receive this letter, please contact our office.  Your physician has recommended you make the following change in your medication:  Take Metoprolol 75 mg (1.5 tablets) twice a day.

## 2013-09-12 NOTE — Progress Notes (Signed)
HPI [name] is referred today by Dr. Harl Bowie for evaluation of atrial fibrillation. The patient is a very pleasant 58 yo woman with a h/o PAF, HTN, and peripheral vascular disease. The patient also had DM. She has had a h/o palpitations for years which have increased in frequency. She was found to have both a long RP tachycardia (likely atrial tachy) and PAF. No syncope. She also has chronic dyspnea. She denies claudication. A recent stress test was considered low risk.  Allergies  Allergen Reactions  . Hydrocodone-Acetaminophen   . Ibuprofen   . Oxycodone Hcl   . Penicillins      Current Outpatient Prescriptions  Medication Sig Dispense Refill  . acetaminophen (TYLENOL) 500 MG tablet Take 500 mg by mouth every 6 (six) hours as needed.      Marland Kitchen allopurinol (ZYLOPRIM) 100 MG tablet Take 1 tablet by mouth Twice daily.      . calcium carbonate (OS-CAL) 600 MG TABS Take 600 mg by mouth 2 (two) times daily with a meal.        . cholecalciferol (VITAMIN D) 1000 UNITS tablet Take 1,000 Units by mouth daily.      Marland Kitchen COLCRYS 0.6 MG tablet Take 1 tablet by mouth Twice daily.      . fenofibrate 160 MG tablet Take 1 tablet by mouth daily.      . fish oil-omega-3 fatty acids 1000 MG capsule Take 1 g by mouth 2 (two) times daily.        . furosemide (LASIX) 20 MG tablet Take 1 tablet (20 mg total) by mouth as needed (For swelling).  30 tablet  3  . glipiZIDE (GLUCOTROL XL) 2.5 MG 24 hr tablet       . levothyroxine (SYNTHROID, LEVOTHROID) 112 MCG tablet Take 112 mcg by mouth daily before breakfast.      . loperamide (IMODIUM A-D) 2 MG tablet Take 2 mg by mouth 4 (four) times daily as needed for diarrhea or loose stools.      Marland Kitchen losartan (COZAAR) 25 MG tablet Take 12.5 mg by mouth daily.       . Magnesium 250 MG TABS Take by mouth daily.      . metoprolol (LOPRESSOR) 50 MG tablet Take 1 tablet (50 mg total) by mouth 2 (two) times daily.  60 tablet  6  . omeprazole (PRILOSEC) 20 MG capsule Take 1  capsule (20 mg total) by mouth 2 (two) times daily.  60 capsule  11  . potassium chloride (K-DUR) 10 MEQ tablet Take 10 mEq by mouth as needed.       . Probiotic Product (ALIGN PO) Take 4 mg by mouth.        . Rivaroxaban (XARELTO) 20 MG TABS tablet Take 1 tablet (20 mg total) by mouth daily with supper.  90 tablet  3  . sitaGLIPtin (JANUVIA) 50 MG tablet Take 50 mg by mouth daily.        Marland Kitchen zolpidem (AMBIEN) 10 MG tablet Take 1 tablet by mouth At bedtime as needed.      . ciprofloxacin (CIPRO) 500 MG tablet       . SSD 1 % cream        No current facility-administered medications for this visit.     Past Medical History  Diagnosis Date  . Microscopic colitis 9/11    Colonoscopy  . Hemorrhoids   . Diverticulosis   . Schatzki's ring     Last EGD with  esophageal dilatation 60F  9/11  . Congenital heart disease   . Paroxysmal SVT (supraventricular tachycardia)   . IDA (iron deficiency anemia)   . GERD (gastroesophageal reflux disease)   . Hypothyroidism   . Hypertension   . Hyperlipidemia   . Renal insufficiency   . Gout   . PVD (peripheral vascular disease)   . Hyperparathyroidism   . Scoliosis   . Chronic bronchitis   . Varicose veins right leg pain and swelling    ROS:   All systems reviewed and negative except as noted in the HPI.   Past Surgical History  Procedure Laterality Date  . Asd repair  Age [facility]  . Right common femoral endarterectomy  2007  . Left parathyroidectomy  2007  . Parathyroid adenoma  2007  . Left hemithyroidectomy    . Tonsillectomy    . Hemorrhoid surgery  2010  . Colonoscopy  03/04/2010    anal canal hemorrhoids otherwise normal. TI normal. Bx showed lymphocytic colitis  . Esophagogastroduodenoscopy  03/04/2010    noncritical appearing Schatzki ring. SB bx negative  . Elas  06-01-11    Right saphenous ELAS      Family History  Problem Relation Age of Onset  . Stroke Mother   . Lung cancer  Father      History   Social History  . Marital Status: Single    Spouse Name: N/A    Number of Children: N/A  . Years of Education: N/A   Occupational History  . Not on file.   Social History Main Topics  . Smoking status: Never Smoker   . Smokeless tobacco: Never Used  . Alcohol Use: No  . Drug Use: No  . Sexual Activity: No   Other Topics Concern  . Not on file   Social History Narrative  . No narrative on file     BP 115/89  Pulse 54  Ht 4\' 10"  (1.473 m)  Wt 151 lb (68.493 kg)  BMI 31.57 kg/m2  Physical Exam:  Well appearing middle aged woman,NAD HEENT: Unremarkable Neck:  7 cm JVD, no thyromegally Back:  No CVA tenderness Lungs:  Clear with no wheezes HEART:  Regular rate rhythm, no murmurs, no rubs, no clicks Abd:  soft, positive bowel sounds, no organomegally, no rebound, no guarding Ext:  2 plus pulses, no edema, no cyanosis, no clubbing Skin:  No rashes no nodules Neuro:  CN II through XII intact, motor grossly intact  EKG - nsr  Assess/Plan:

## 2013-09-16 ENCOUNTER — Ambulatory Visit (HOSPITAL_COMMUNITY)
Admission: RE | Admit: 2013-09-16 | Discharge: 2013-09-16 | Disposition: A | Discharge status: Home / Self Care | Payer: Medicare Other | Source: Ambulatory Visit | Attending: Podiatry | Admitting: Podiatry

## 2013-09-16 ENCOUNTER — Encounter: Payer: Self-pay | Admitting: Internal Medicine

## 2013-09-16 ENCOUNTER — Other Ambulatory Visit (HOSPITAL_COMMUNITY): Payer: Self-pay | Admitting: Podiatry

## 2013-09-16 DIAGNOSIS — I739 Peripheral vascular disease, unspecified: Secondary | ICD-10-CM | POA: Insufficient documentation

## 2013-09-16 DIAGNOSIS — Z7901 Long term (current) use of anticoagulants: Secondary | ICD-10-CM | POA: Insufficient documentation

## 2013-09-16 DIAGNOSIS — E119 Type 2 diabetes mellitus without complications: Secondary | ICD-10-CM | POA: Insufficient documentation

## 2013-09-16 DIAGNOSIS — L98499 Non-pressure chronic ulcer of skin of other sites with unspecified severity: Secondary | ICD-10-CM

## 2013-09-16 DIAGNOSIS — I10 Essential (primary) hypertension: Secondary | ICD-10-CM | POA: Insufficient documentation

## 2013-09-16 DIAGNOSIS — E785 Hyperlipidemia, unspecified: Secondary | ICD-10-CM | POA: Insufficient documentation

## 2013-09-16 DIAGNOSIS — I4891 Unspecified atrial fibrillation: Secondary | ICD-10-CM | POA: Insufficient documentation

## 2013-09-16 DIAGNOSIS — R238 Other skin changes: Secondary | ICD-10-CM | POA: Insufficient documentation

## 2013-09-16 NOTE — Assessment & Plan Note (Signed)
The patient was given flecainide and took a single tablet then stopped it. She also has not taken her anti-coagulation. I discussed the importance of this and taking her anti-arrhythmic meds but she refuses. She is out of rhythm today and her rate is increased. I have asked the patient to try rate control with a beta blocker but also offered her rhythmol. She is considering her options. For now she will continue with rate control and will stop her anti-arrythmic meds.

## 2013-09-22 ENCOUNTER — Telehealth: Payer: Self-pay | Admitting: Internal Medicine

## 2013-09-22 NOTE — Telephone Encounter (Signed)
Patient wants return call regarding medications / tgs

## 2013-09-22 NOTE — Telephone Encounter (Signed)
Pt called and would like to try Rythmol. Please advise if this is ok.

## 2013-09-22 NOTE — Telephone Encounter (Signed)
Left message for pt to call back  °

## 2013-09-23 MED ORDER — PROPAFENONE HCL 150 MG PO TABS: 150.0000 mg | ORAL_TABLET | Freq: Three times a day (TID) | ORAL | Status: DC

## 2013-09-23 NOTE — Telephone Encounter (Signed)
Left message for pt to call office back tomorrow. Sent in prescription for Rhythmol to [facility].

## 2013-09-23 NOTE — Addendum Note (Signed)
Addended by: Truett Mainland on: 09/23/2013 05:51 PM   Modules accepted: Orders

## 2013-09-23 NOTE — Telephone Encounter (Signed)
This would be ok. Take 150 mg three times daily as needed.

## 2013-10-03 ENCOUNTER — Other Ambulatory Visit: Payer: Self-pay | Admitting: *Deleted

## 2013-10-03 ENCOUNTER — Ambulatory Visit: Payer: Medicare Other | Admitting: Internal Medicine

## 2013-10-03 DIAGNOSIS — I739 Peripheral vascular disease, unspecified: Secondary | ICD-10-CM

## 2013-10-03 DIAGNOSIS — L98499 Non-pressure chronic ulcer of skin of other sites with unspecified severity: Principal | ICD-10-CM

## 2013-10-07 ENCOUNTER — Encounter: Payer: Self-pay | Admitting: Family

## 2013-10-08 ENCOUNTER — Ambulatory Visit (HOSPITAL_COMMUNITY)
Admission: RE | Admit: 2013-10-08 | Discharge: 2013-10-08 | Disposition: A | Discharge status: Home / Self Care | Payer: Medicare Other | Source: Ambulatory Visit | Attending: Family | Admitting: Family

## 2013-10-08 ENCOUNTER — Ambulatory Visit (INDEPENDENT_AMBULATORY_CARE_PROVIDER_SITE_OTHER): Payer: Medicare Other | Admitting: Family

## 2013-10-08 ENCOUNTER — Encounter: Payer: Self-pay | Admitting: Family

## 2013-10-08 VITALS — BP 112/80 | HR 86 | Resp 14 | Ht <= 58 in | Wt 150.0 lb

## 2013-10-08 DIAGNOSIS — I999 Unspecified disorder of circulatory system: Secondary | ICD-10-CM

## 2013-10-08 DIAGNOSIS — I739 Peripheral vascular disease, unspecified: Secondary | ICD-10-CM

## 2013-10-08 DIAGNOSIS — Z48812 Encounter for surgical aftercare following surgery on the circulatory system: Secondary | ICD-10-CM

## 2013-10-08 DIAGNOSIS — L98499 Non-pressure chronic ulcer of skin of other sites with unspecified severity: Principal | ICD-10-CM

## 2013-10-08 DIAGNOSIS — IMO0001 Reserved for inherently not codable concepts without codable children: Secondary | ICD-10-CM | POA: Insufficient documentation

## 2013-10-08 DIAGNOSIS — I7025 Atherosclerosis of native arteries of other extremities with ulceration: Secondary | ICD-10-CM | POA: Insufficient documentation

## 2013-10-08 DIAGNOSIS — W010XXA Fall on same level from slipping, tripping and stumbling without subsequent striking against object, initial encounter: Secondary | ICD-10-CM

## 2013-10-08 DIAGNOSIS — J00 Acute nasopharyngitis [common cold]: Secondary | ICD-10-CM

## 2013-10-08 HISTORY — DX: Fall on same level from slipping, tripping and stumbling without subsequent striking against object, initial encounter: W01.0XXA

## 2013-10-08 NOTE — Patient Instructions (Signed)
Venous Stasis or Chronic Venous Insufficiency Chronic venous insufficiency, also called venous stasis, is a condition that affects the veins in the legs. The condition prevents blood from being pumped through these veins effectively. Blood may no longer be pumped effectively from the legs back to the heart. This condition can range from mild to severe. With proper treatment, you should be able to continue with an active life. CAUSES  Chronic venous insufficiency occurs when the vein walls become stretched, weakened, or damaged or when valves within the vein are damaged. Some common causes of this include:  High blood pressure inside the veins (venous hypertension).  Increased blood pressure in the leg veins from long periods of sitting or standing.  A blood clot that blocks blood flow in a vein (deep vein thrombosis).  Inflammation of a superficial vein (phlebitis) that causes a blood clot to form. RISK FACTORS Various things can make you more likely to develop chronic venous insufficiency, including:  Family history of this condition.  Obesity.  Pregnancy.  Sedentary lifestyle.  Smoking.  Jobs requiring long periods of standing or sitting in one place.  Being a certain age. Women in their 44s and 91s and men in their 56s are more likely to develop this condition. SIGNS AND SYMPTOMS  Symptoms may include:   Varicose veins.  Skin breakdown or ulcers.  Reddened or discolored skin on the leg.  Brown, smooth, tight, and painful skin just above the ankle, usually on the inside surface (lipodermatosclerosis).  Swelling. DIAGNOSIS  To diagnose this condition, your health care provider will take a medical history and do a physical exam. The following tests may be ordered to confirm the diagnosis:  Duplex ultrasound A procedure that produces a picture of a blood vessel and nearby organs and also provides information on blood flow through the blood vessel.  Plethysmography A  procedure that tests blood flow.  A venogram, or venography A procedure used to look at the veins using X-ray and dye. TREATMENT The goals of treatment are to help you return to an active life and to minimize pain or disability. Treatment will depend on the severity of the condition. Medical procedures may be needed for severe cases. Treatment options may include:   Use of compression stockings. These can help with symptoms and lower the chances of the problem getting worse, but they do not cure the problem.  Sclerotherapy A procedure involving an injection of a material that "dissolves" the damaged veins. Other veins in the network of blood vessels take over the function of the damaged veins.  Surgery to remove the vein or cut off blood flow through the vein (vein stripping or laser ablation surgery).  Surgery to repair a valve. HOME CARE INSTRUCTIONS   Wear compression stockings as directed by your health care provider.  Only take over-the-counter or prescription medicines for pain, discomfort, or fever as directed by your health care provider.  Follow up with your health care provider as directed. SEEK MEDICAL CARE IF:   You have redness, swelling, or increasing pain in the affected area.  You see a red streak or line that extends up or down from the affected area.  You have a breakdown or loss of skin in the affected area, even if the breakdown is small.  You have an injury to the affected area. SEEK IMMEDIATE MEDICAL CARE IF:   You have an injury and open wound in the affected area.  Your pain is severe and does not improve with  medicine.  You have sudden numbness or weakness in the foot or ankle below the affected area, or you have trouble moving your foot or ankle.  You have a fever or persistent symptoms for more than 2 3 days.  You have a fever and your symptoms suddenly get worse. MAKE SURE YOU:   Understand these instructions.  Will watch your condition.  Will  get help right away if you are not doing well or get worse. Document Released: 10/16/2006 Document Revised: 04/02/2013 Document Reviewed: 02/17/2013 [facility] Patient Information 2014 [place].

## 2013-10-08 NOTE — Progress Notes (Signed)
Established Venous Insufficiency History of Present Illness  [name] is a 58 y.o. (December 02, 1955) female patient of Dr. Donnetta Hutching seen for serial ABIs status post a right femoral endarterectomy in April 2007,  who presents with chief complaint: non healing ulcer on left great toe.  The patient's symptoms have improved.   She fell this morning about 0830 or 0845, hit her head fairly hard on the curb, denies LOC, states she initially had a headache, does not now. She takes coumadin for atrial fib. She had ABI's done at [facility] on 09/16/13 which were WNL including both TBI's. Her podiatrist is treating her left toe ulcer which pt states started with left great toe ingrown toenail, states he prescribed an antibiotic for this also. She had distal aspect of left great toenail removed. She states that she has been wearing a thigh high compression stocking on her right leg only, she points to venous stasis changes in the right lower leg; advised to wear thigh high graduated compression stockings on both legs, donn in the morning, remove at bedtime. Also advised to elevate feet above heart level when not walking. She states her feet and lower legs are not swollen in the morning. She also reports that the left great toe ulcer is healing lately.  She states her DM is in fairly good control.   Past Medical History  Diagnosis Date  . Microscopic colitis 9/11    Colonoscopy  . Hemorrhoids   . Diverticulosis   . Schatzki's ring     Last EGD with esophageal dilatation 15F  9/11  . Congenital heart disease   . Paroxysmal SVT (supraventricular tachycardia)   . IDA (iron deficiency anemia)   . GERD (gastroesophageal reflux disease)   . Hypothyroidism   . Hypertension   . Hyperlipidemia   . Renal insufficiency   . Gout   . PVD (peripheral vascular disease)   . Hyperparathyroidism   . Scoliosis   . Chronic bronchitis   . Varicose veins right leg pain and swelling   Past Surgical  History  Procedure Laterality Date  . Asd repair  Age [facility]  . Right common femoral endarterectomy  2007  . Left parathyroidectomy  2007  . Parathyroid adenoma  2007  . Left hemithyroidectomy    . Tonsillectomy    . Hemorrhoid surgery  2010  . Colonoscopy  03/04/2010    anal canal hemorrhoids otherwise normal. TI normal. Bx showed lymphocytic colitis  . Esophagogastroduodenoscopy  03/04/2010    noncritical appearing Schatzki ring. SB bx negative  . Elas  06-01-11    Right saphenous ELAS    History   Social History  . Marital Status: Single    Spouse Name: N/A    Number of Children: N/A  . Years of Education: N/A   Occupational History  . Not on file.   Social History Main Topics  . Smoking status: Never Smoker   . Smokeless tobacco: Never Used  . Alcohol Use: No  . Drug Use: No  . Sexual Activity: No   Other Topics Concern  . Not on file   Social History Narrative  . No narrative on file   Family History  Problem Relation Age of Onset  . Stroke Mother   . Lung cancer Father    Current Outpatient Prescriptions on File Prior to Visit  Medication Sig Dispense Refill  . acetaminophen (TYLENOL) 500 MG tablet Take 500  mg by mouth every 6 (six) hours as needed.      Marland Kitchen allopurinol (ZYLOPRIM) 100 MG tablet Take 1 tablet by mouth Twice daily.      . calcium carbonate (OS-CAL) 600 MG TABS Take 600 mg by mouth 2 (two) times daily with a meal.        . cholecalciferol (VITAMIN D) 1000 UNITS tablet Take 1,000 Units by mouth daily.      . ciprofloxacin (CIPRO) 500 MG tablet       . COLCRYS 0.6 MG tablet Take 1 tablet by mouth Twice daily.      . fenofibrate 160 MG tablet Take 1 tablet by mouth daily.      . fish oil-omega-3 fatty acids 1000 MG capsule Take 1 g by mouth 2 (two) times daily.        . furosemide (LASIX) 20 MG tablet Take 1 tablet (20 mg total) by mouth as needed (For swelling).  30 tablet  3  . glipiZIDE (GLUCOTROL XL)  2.5 MG 24 hr tablet       . levothyroxine (SYNTHROID, LEVOTHROID) 112 MCG tablet Take 112 mcg by mouth daily before breakfast.      . loperamide (IMODIUM A-D) 2 MG tablet Take 2 mg by mouth 4 (four) times daily as needed for diarrhea or loose stools.      Marland Kitchen losartan (COZAAR) 25 MG tablet Take 12.5 mg by mouth daily.       . Magnesium 250 MG TABS Take by mouth daily.      . metoprolol (LOPRESSOR) 50 MG tablet Take 1.5 tablets (75 mg total) by mouth 2 (two) times daily.  60 tablet  6  . omeprazole (PRILOSEC) 20 MG capsule Take 1 capsule (20 mg total) by mouth 2 (two) times daily.  60 capsule  11  . potassium chloride (K-DUR) 10 MEQ tablet Take 10 mEq by mouth as needed.       . Probiotic Product (ALIGN PO) Take 4 mg by mouth.        . propafenone (RYTHMOL) 150 MG tablet Take 1 tablet (150 mg total) by mouth every 8 (eight) hours. As needed for palpitations  90 tablet  3  . Rivaroxaban (XARELTO) 20 MG TABS tablet Take 1 tablet (20 mg total) by mouth daily with supper.  90 tablet  3  . sitaGLIPtin (JANUVIA) 50 MG tablet Take 50 mg by mouth daily.        Marland Kitchen SSD 1 % cream       . zolpidem (AMBIEN) 10 MG tablet Take 1 tablet by mouth At bedtime as needed.       No current facility-administered medications on file prior to visit.   Allergies  Allergen Reactions  . Hydrocodone-Acetaminophen Nausea Only and Other (See Comments)    Severe headache  . Ibuprofen Other (See Comments)    Kidney disfunction  . Oxycodone Hcl Nausea Only    Headache  . Penicillins Nausea Only and Other (See Comments)    Severe headache    On ROS today: See HPI for pertinent positives and negatives.  Physical Examination  Filed Vitals:   10/08/13 1117  BP: 112/80  Pulse: 86  Resp: 14   Filed Weights   10/08/13 1117  Weight: 150 lb (68.04 kg)   Body mass index is 31.36 kg/(m^2).  General: A&O x 3, WD, Obese female.  Pulmonary: Sym exp, good air movt, CTAB, no rales, rhonchi, & wheezing.  Cardiac: RRR, Nl  S1, S2, no  detected murmur.  Vascular: Vessel Right Left  Radial 1+Palpable notPalpable  Brachial Palpable Palpable  Carotid  without bruit  without bruit  Popliteal Not palpable Not palpable  PT notPalpable not Palpable  DP 2+Palpable 2+Palpable   Gastrointestinal: soft, NTND, -G/R, - HSM, - masses, - CVAT B.  Musculoskeletal: M/S 4/5 throughout, Extremities without ischemic changes. 2-3+ pitting edema in both lower legs. Venous stasis changes in skin of right lower leg with leathery skin and hemosiderin deposits. Toes 1-4 of left foot are cyanotic and cool. No open wounds, there is a light colored dried ulcer at the tip of the left great toe.  Neurologic: Pain and light touch intact in extremities, Motor exam as listed above.  Non-Invasive Vascular Imaging  09/16/2013 ABI's from [facility]: Right: 1.2, TBI: 0.77; Left: 1.21, TBI: 0.87, all triphasic waveforms Normal ABI's and TBI's  BLE Venous Insufficiency Duplex (Date: 06/08/2011):   RLE: no DVT and SVT, no (ablated) GSV reflux, + deep venous reflux >500 milliseconds in the common femoral vein  LLE: no DVT and SVT, + GSV reflux >500 milliseconds, + deep venous reflux >500 milliseconds.  Medical Decision Making  STARKISHA MERCIER is a 58 y.o. female who presents with: left leg chronic venous insufficiency in both the GSV and the common femoral vein according to venous Duplex on 06/08/2011. Spoke with Dr. Delphina Cahill today re patient falling this morning, hitting her head, taking coumadin. He indicates the current guidelines do not recommend evaluation unless she has neurological changes such as changes in vision, headache, sudden confusion. Patient was informed of this and instructed to call 911 should she experience the above symptoms.  From ABI's done 09/16/2013 at [facility]: no evidence of arterial occlusive disease in both legs and great toes.  Wear thigh high graduated compression stockings, 20-30 mm Hg,  on  both legs, donn in the morning, remove at bedtime, prescription given for same. Elevate feet above heart level when not walking.  Based on the patient's vascular studies and examination, I have offered the patient: follow up in 2 weeks for updated left leg reflux venous imaging and see Dr. Donnetta Hutching in the vein clinic in 2 weeks to evaluate if her left GSV is amenable to ablation and whether ablation would help resolve her left toes ulcers.  Her podiatrist is treating her left great toe venous stasis ulcer which is improving per patient.  I discussed with the patient the use of her 20-30 mm thigh high compression stockings and need for 3 month trial of such. Thank you for allowing [place] to participate in this patient's care.  Clemon Chambers, RN, MSN, FNP-C [facility] Office: [phone]  Clinic MD: Kellie Simmering on call  10/08/2013, 11:07 AM

## 2013-10-09 NOTE — Addendum Note (Signed)
Addended by: Dorthula Rue L on: 10/09/2013 04:45 PM   Modules accepted: Orders

## 2013-10-16 ENCOUNTER — Encounter: Payer: Self-pay | Admitting: Vascular Surgery

## 2013-10-17 ENCOUNTER — Ambulatory Visit (HOSPITAL_COMMUNITY)
Admission: RE | Admit: 2013-10-17 | Discharge: 2013-10-17 | Disposition: A | Discharge status: Home / Self Care | Payer: Medicare Other | Source: Ambulatory Visit | Attending: Vascular Surgery | Admitting: Vascular Surgery

## 2013-10-17 ENCOUNTER — Encounter: Payer: Self-pay | Admitting: Vascular Surgery

## 2013-10-17 ENCOUNTER — Ambulatory Visit (INDEPENDENT_AMBULATORY_CARE_PROVIDER_SITE_OTHER): Payer: Medicare Other | Admitting: Vascular Surgery

## 2013-10-17 VITALS — BP 139/98 | HR 88 | Resp 18 | Ht 58.5 in | Wt 153.3 lb

## 2013-10-17 DIAGNOSIS — I83009 Varicose veins of unspecified lower extremity with ulcer of unspecified site: Secondary | ICD-10-CM | POA: Insufficient documentation

## 2013-10-17 DIAGNOSIS — L98499 Non-pressure chronic ulcer of skin of other sites with unspecified severity: Principal | ICD-10-CM | POA: Insufficient documentation

## 2013-10-17 DIAGNOSIS — I739 Peripheral vascular disease, unspecified: Secondary | ICD-10-CM

## 2013-10-17 DIAGNOSIS — J00 Acute nasopharyngitis [common cold]: Secondary | ICD-10-CM

## 2013-10-17 DIAGNOSIS — I999 Unspecified disorder of circulatory system: Secondary | ICD-10-CM

## 2013-10-17 DIAGNOSIS — IMO0001 Reserved for inherently not codable concepts without codable children: Secondary | ICD-10-CM

## 2013-10-17 DIAGNOSIS — R0989 Other specified symptoms and signs involving the circulatory and respiratory systems: Secondary | ICD-10-CM | POA: Insufficient documentation

## 2013-10-17 DIAGNOSIS — L97909 Non-pressure chronic ulcer of unspecified part of unspecified lower leg with unspecified severity: Secondary | ICD-10-CM

## 2013-10-17 NOTE — Progress Notes (Signed)
Patient is here today for continued followup of her left great toe ulceration and also for noninvasive vascular lab studies. She was seen in our office several weeks ago for arterial evaluation this showed normal arterial flow. She does have a history of venous pathology in the past having undergone prior right great saphenous vein ablation. She is seen today for venous studies on the left. She does have swelling in both right and left leg but this is actually more so in her right and her left leg. She does have known deep venous reflux. She does have a fibrillation and some fluid overload. She has difficulty walking due to shortness of breath. Her at foot is without any pain great toe ulceration this is actually a callus at the tip of her great toe currently.  Past Medical History  Diagnosis Date  . Microscopic colitis 9/11    Colonoscopy  . Hemorrhoids   . Diverticulosis   . Schatzki's ring     Last EGD with esophageal dilatation 7F  9/11  . Congenital heart disease   . Paroxysmal SVT (supraventricular tachycardia)   . IDA (iron deficiency anemia)   . GERD (gastroesophageal reflux disease)   . Hypothyroidism   . Hypertension   . Hyperlipidemia   . Renal insufficiency   . Gout   . PVD (peripheral vascular disease)   . Hyperparathyroidism   . Scoliosis   . Chronic bronchitis   . Varicose veins right leg pain and swelling  . Atrial fibrillation Jan. 2015  . Fall due to stumbling October 08, 2013    Due to shoes  . Diabetes mellitus without complication     Type II    History  Substance Use Topics  . Smoking status: Never Smoker   . Smokeless tobacco: Never Used  . Alcohol Use: No    Family History  Problem Relation Age of Onset  . Stroke Mother   . Heart disease Mother     CHF  - Amputation-Right Leg  . Hyperlipidemia Mother   . Hypertension Mother   . Lung cancer Father   . Cancer Father     Lung    Allergies  Allergen Reactions  . Hydrocodone-Acetaminophen Nausea  Only and Other (See Comments)    Severe headache  . Ibuprofen Other (See Comments)    Kidney disfunction  . Oxycodone Hcl Nausea Only    Headache  . Penicillins Nausea Only and Other (See Comments)    Severe headache    Current outpatient prescriptions:acetaminophen (TYLENOL) 500 MG tablet, Take 500 mg by mouth every 6 (six) hours as needed., Disp: , Rfl: ;  allopurinol (ZYLOPRIM) 100 MG tablet, Take 1 tablet by mouth Twice daily., Disp: , Rfl: ;  calcium carbonate (OS-CAL) 600 MG TABS, Take 600 mg by mouth 2 (two) times daily with a meal.  , Disp: , Rfl: ;  cholecalciferol (VITAMIN D) 1000 UNITS tablet, Take 1,000 Units by mouth daily., Disp: , Rfl:  ciprofloxacin (CIPRO) 500 MG tablet, , Disp: , Rfl: ;  COLCRYS 0.6 MG tablet, Take 1 tablet by mouth Twice daily., Disp: , Rfl: ;  fenofibrate 160 MG tablet, Take 1 tablet by mouth daily., Disp: , Rfl: ;  fish oil-omega-3 fatty acids 1000 MG capsule, Take 1 g by mouth 2 (two) times daily.  , Disp: , Rfl: ;  furosemide (LASIX) 20 MG tablet, Take 1 tablet (20 mg total) by mouth as needed (For swelling)., Disp: 30 tablet, Rfl: 3 glipiZIDE (GLUCOTROL XL) 2.5  MG 24 hr tablet, , Disp: , Rfl: ;  levothyroxine (SYNTHROID, LEVOTHROID) 112 MCG tablet, Take 112 mcg by mouth daily before breakfast., Disp: , Rfl: ;  loperamide (IMODIUM A-D) 2 MG tablet, Take 2 mg by mouth 4 (four) times daily as needed for diarrhea or loose stools., Disp: , Rfl: ;  losartan (COZAAR) 25 MG tablet, Take 12.5 mg by mouth daily. , Disp: , Rfl: ;  Magnesium 250 MG TABS, Take by mouth daily., Disp: , Rfl:  metoprolol (LOPRESSOR) 50 MG tablet, Take 1.5 tablets (75 mg total) by mouth 2 (two) times daily., Disp: 60 tablet, Rfl: 6;  omeprazole (PRILOSEC) 20 MG capsule, Take 1 capsule (20 mg total) by mouth 2 (two) times daily., Disp: 60 capsule, Rfl: 11;  potassium chloride (K-DUR) 10 MEQ tablet, Take 10 mEq by mouth as needed. , Disp: , Rfl: ;  Probiotic Product (ALIGN PO), Take 4 mg by mouth.   , Disp: , Rfl:  propafenone (RYTHMOL) 150 MG tablet, Take 1 tablet (150 mg total) by mouth every 8 (eight) hours. As needed for palpitations, Disp: 90 tablet, Rfl: 3;  Rivaroxaban (XARELTO) 20 MG TABS tablet, Take 1 tablet (20 mg total) by mouth daily with supper., Disp: 90 tablet, Rfl: 3;  sitaGLIPtin (JANUVIA) 50 MG tablet, Take 50 mg by mouth daily.  , Disp: , Rfl: ;  SSD 1 % cream, , Disp: , Rfl:  zolpidem (AMBIEN) 10 MG tablet, Take 1 tablet by mouth At bedtime as needed., Disp: , Rfl:   BP 139/98  Pulse 88  Resp 18  Ht 4' 10.5" (1.486 m)  Wt 153 lb 4.8 oz (69.536 kg)  BMI 31.49 kg/m2  Body mass index is 31.49 kg/(m^2).       On physical exam she does have bluish discoloration of her toes bilaterally. Nonetheless she has 2+ left dorsalis pedis pulse. She does have a callus over the tip of her left great toe with no evidence of surrounding erythema. She does have changes of venous hypertension with hemosiderin deposit in the medial left ankle. This is more so on the right.  She underwent noninvasive left leg venous duplex today and this reveals reflux throughout her great saphenous vein and also reflux and multiple perforators in her medial calf.  Impression and plan: No evidence of lower surety arterial insufficiency. I discussed this at length the patient explained this puts her at no risk for limb threatening ischemia. She was relieved with this and her mother did have amputation prior to tying related to nonhealing ischemia to her foot. She does have venous hypertension but has had no significant symptoms related to this we would recommend elevation and compression only treatment. She was reassured this discussion will see [place] again on an outpatient basis

## 2013-10-21 ENCOUNTER — Encounter (HOSPITAL_COMMUNITY): Payer: Medicare Other

## 2013-10-21 ENCOUNTER — Ambulatory Visit: Payer: Medicare Other | Admitting: Vascular Surgery

## 2013-11-06 ENCOUNTER — Emergency Department (HOSPITAL_COMMUNITY): Payer: Medicare Other

## 2013-11-06 ENCOUNTER — Encounter (HOSPITAL_COMMUNITY): Payer: Self-pay | Admitting: Emergency Medicine

## 2013-11-06 ENCOUNTER — Inpatient Hospital Stay (HOSPITAL_COMMUNITY)
Admission: EM | Admit: 2013-11-06 | Discharge: 2013-11-15 | DRG: 308 | Disposition: A | Discharge status: Home / Self Care | Payer: Medicare Other | Attending: Internal Medicine | Admitting: Internal Medicine

## 2013-11-06 DIAGNOSIS — E669 Obesity, unspecified: Secondary | ICD-10-CM | POA: Diagnosis present

## 2013-11-06 DIAGNOSIS — E119 Type 2 diabetes mellitus without complications: Secondary | ICD-10-CM

## 2013-11-06 DIAGNOSIS — I4891 Unspecified atrial fibrillation: Principal | ICD-10-CM

## 2013-11-06 DIAGNOSIS — M359 Systemic involvement of connective tissue, unspecified: Secondary | ICD-10-CM

## 2013-11-06 DIAGNOSIS — IMO0001 Reserved for inherently not codable concepts without codable children: Secondary | ICD-10-CM

## 2013-11-06 DIAGNOSIS — I509 Heart failure, unspecified: Secondary | ICD-10-CM

## 2013-11-06 DIAGNOSIS — I83893 Varicose veins of bilateral lower extremities with other complications: Secondary | ICD-10-CM

## 2013-11-06 DIAGNOSIS — I471 Supraventricular tachycardia, unspecified: Secondary | ICD-10-CM

## 2013-11-06 DIAGNOSIS — N189 Chronic kidney disease, unspecified: Secondary | ICD-10-CM

## 2013-11-06 DIAGNOSIS — Z801 Family history of malignant neoplasm of trachea, bronchus and lung: Secondary | ICD-10-CM

## 2013-11-06 DIAGNOSIS — R0602 Shortness of breath: Secondary | ICD-10-CM

## 2013-11-06 DIAGNOSIS — I5031 Acute diastolic (congestive) heart failure: Secondary | ICD-10-CM

## 2013-11-06 DIAGNOSIS — R0902 Hypoxemia: Secondary | ICD-10-CM

## 2013-11-06 DIAGNOSIS — I5033 Acute on chronic diastolic (congestive) heart failure: Secondary | ICD-10-CM | POA: Diagnosis present

## 2013-11-06 DIAGNOSIS — Z79899 Other long term (current) drug therapy: Secondary | ICD-10-CM

## 2013-11-06 DIAGNOSIS — I482 Chronic atrial fibrillation, unspecified: Secondary | ICD-10-CM

## 2013-11-06 DIAGNOSIS — I999 Unspecified disorder of circulatory system: Secondary | ICD-10-CM

## 2013-11-06 DIAGNOSIS — R001 Bradycardia, unspecified: Secondary | ICD-10-CM

## 2013-11-06 DIAGNOSIS — E785 Hyperlipidemia, unspecified: Secondary | ICD-10-CM | POA: Diagnosis present

## 2013-11-06 DIAGNOSIS — I83009 Varicose veins of unspecified lower extremity with ulcer of unspecified site: Secondary | ICD-10-CM

## 2013-11-06 DIAGNOSIS — E039 Hypothyroidism, unspecified: Secondary | ICD-10-CM | POA: Diagnosis present

## 2013-11-06 DIAGNOSIS — E0590 Thyrotoxicosis, unspecified without thyrotoxic crisis or storm: Secondary | ICD-10-CM

## 2013-11-06 DIAGNOSIS — M109 Gout, unspecified: Secondary | ICD-10-CM | POA: Diagnosis present

## 2013-11-06 DIAGNOSIS — I5189 Other ill-defined heart diseases: Secondary | ICD-10-CM

## 2013-11-06 DIAGNOSIS — L97909 Non-pressure chronic ulcer of unspecified part of unspecified lower leg with unspecified severity: Secondary | ICD-10-CM

## 2013-11-06 DIAGNOSIS — Z6832 Body mass index (BMI) 32.0-32.9, adult: Secondary | ICD-10-CM

## 2013-11-06 DIAGNOSIS — E875 Hyperkalemia: Secondary | ICD-10-CM

## 2013-11-06 DIAGNOSIS — M4120 Other idiopathic scoliosis, site unspecified: Secondary | ICD-10-CM | POA: Diagnosis present

## 2013-11-06 DIAGNOSIS — I4892 Unspecified atrial flutter: Secondary | ICD-10-CM | POA: Diagnosis present

## 2013-11-06 DIAGNOSIS — Z823 Family history of stroke: Secondary | ICD-10-CM

## 2013-11-06 DIAGNOSIS — I959 Hypotension, unspecified: Secondary | ICD-10-CM

## 2013-11-06 DIAGNOSIS — N183 Chronic kidney disease, stage 3 unspecified: Secondary | ICD-10-CM

## 2013-11-06 DIAGNOSIS — K219 Gastro-esophageal reflux disease without esophagitis: Secondary | ICD-10-CM

## 2013-11-06 DIAGNOSIS — I129 Hypertensive chronic kidney disease with stage 1 through stage 4 chronic kidney disease, or unspecified chronic kidney disease: Secondary | ICD-10-CM | POA: Diagnosis present

## 2013-11-06 DIAGNOSIS — Z0189 Encounter for other specified special examinations: Secondary | ICD-10-CM

## 2013-11-06 DIAGNOSIS — Z8249 Family history of ischemic heart disease and other diseases of the circulatory system: Secondary | ICD-10-CM

## 2013-11-06 DIAGNOSIS — I739 Peripheral vascular disease, unspecified: Secondary | ICD-10-CM

## 2013-11-06 DIAGNOSIS — L98499 Non-pressure chronic ulcer of skin of other sites with unspecified severity: Secondary | ICD-10-CM

## 2013-11-06 DIAGNOSIS — K5900 Constipation, unspecified: Secondary | ICD-10-CM

## 2013-11-06 LAB — URINALYSIS, ROUTINE W REFLEX MICROSCOPIC
Bilirubin Urine: NEGATIVE
GLUCOSE, UA: NEGATIVE mg/dL
KETONES UR: NEGATIVE mg/dL
LEUKOCYTES UA: NEGATIVE
Nitrite: NEGATIVE
PH: 5.5 (ref 5.0–8.0)
Protein, ur: 30 mg/dL — AB
Specific Gravity, Urine: 1.025 (ref 1.005–1.030)
Urobilinogen, UA: 0.2 mg/dL (ref 0.0–1.0)

## 2013-11-06 LAB — CBC WITH DIFFERENTIAL/PLATELET
BASOS PCT: 1 % (ref 0–1)
Basophils Absolute: 0 10*3/uL (ref 0.0–0.1)
Eosinophils Absolute: 0.1 10*3/uL (ref 0.0–0.7)
Eosinophils Relative: 1 % (ref 0–5)
HCT: 43.6 % (ref 36.0–46.0)
Hemoglobin: 13.9 g/dL (ref 12.0–15.0)
Lymphocytes Relative: 29 % (ref 12–46)
Lymphs Abs: 2.4 10*3/uL (ref 0.7–4.0)
MCH: 28.5 pg (ref 26.0–34.0)
MCHC: 31.9 g/dL (ref 30.0–36.0)
MCV: 89.5 fL (ref 78.0–100.0)
Monocytes Absolute: 0.7 10*3/uL (ref 0.1–1.0)
Monocytes Relative: 9 % (ref 3–12)
Neutro Abs: 5.2 10*3/uL (ref 1.7–7.7)
Neutrophils Relative %: 60 % (ref 43–77)
Platelets: 277 10*3/uL (ref 150–400)
RBC: 4.87 MIL/uL (ref 3.87–5.11)
RDW: 14.6 % (ref 11.5–15.5)
WBC: 8.5 10*3/uL (ref 4.0–10.5)

## 2013-11-06 LAB — COMPREHENSIVE METABOLIC PANEL
ALT: 19 U/L (ref 0–35)
AST: 34 U/L (ref 0–37)
Albumin: 3.6 g/dL (ref 3.5–5.2)
Alkaline Phosphatase: 31 U/L — ABNORMAL LOW (ref 39–117)
BILIRUBIN TOTAL: 1.1 mg/dL (ref 0.3–1.2)
BUN: 24 mg/dL — ABNORMAL HIGH (ref 6–23)
CHLORIDE: 94 meq/L — AB (ref 96–112)
CO2: 21 mEq/L (ref 19–32)
Calcium: 9.7 mg/dL (ref 8.4–10.5)
Creatinine, Ser: 1.71 mg/dL — ABNORMAL HIGH (ref 0.50–1.10)
GFR calc Af Amer: 37 mL/min — ABNORMAL LOW (ref >90)
GFR calc non Af Amer: 32 mL/min — ABNORMAL LOW (ref >90)
Glucose, Bld: 196 mg/dL — ABNORMAL HIGH (ref 70–99)
Potassium: 5.9 mEq/L — ABNORMAL HIGH (ref 3.7–5.3)
Sodium: 131 mEq/L — ABNORMAL LOW (ref 137–147)
Total Protein: 7.4 g/dL (ref 6.0–8.3)

## 2013-11-06 LAB — URINE MICROSCOPIC-ADD ON

## 2013-11-06 LAB — TROPONIN I: Troponin I: <0.3 ng/mL (ref <0.30)

## 2013-11-06 LAB — PRO B NATRIURETIC PEPTIDE: PRO B NATRI PEPTIDE: 5085 pg/mL — AB (ref 0–125)

## 2013-11-06 LAB — LIPASE, BLOOD: Lipase: 44 U/L (ref 11–59)

## 2013-11-06 MED ORDER — SODIUM CHLORIDE 0.9 % IV BOLUS (SEPSIS)
500.0000 mL | Freq: Once | INTRAVENOUS | Status: AC
  Administered 2013-11-06: 500 mL via INTRAVENOUS

## 2013-11-06 MED ORDER — METOPROLOL TARTRATE 1 MG/ML IV SOLN
5.0000 mg | Freq: Once | INTRAVENOUS | Status: AC
  Administered 2013-11-06: 5 mg via INTRAVENOUS
  Filled 2013-11-06: qty 5

## 2013-11-06 MED ORDER — FUROSEMIDE 10 MG/ML IJ SOLN
40.0000 mg | Freq: Once | INTRAMUSCULAR | Status: AC
  Administered 2013-11-06: 40 mg via INTRAVENOUS
  Filled 2013-11-06: qty 4

## 2013-11-06 MED ORDER — LABETALOL HCL 5 MG/ML IV SOLN
10.0000 mg | Freq: Once | INTRAVENOUS | Status: AC
  Administered 2013-11-06: 10 mg via INTRAVENOUS
  Filled 2013-11-06: qty 4

## 2013-11-06 NOTE — ED Notes (Signed)
Patient c/o shortness of breath and states her heart feels like it's fluttering.

## 2013-11-06 NOTE — ED Provider Notes (Signed)
CSN: [id number]     Arrival date & time 11/06/13  2102 History  This chart was scribed for Carmin Muskrat, MD by Rolanda Lundborg, ED Scribe. This patient was seen in room APA14/APA14 and the patient's care was started at 9:18 PM.    Chief Complaint  Patient presents with  . Shortness of Breath   The history is provided by the patient. No language interpreter was used.   HPI Comments: [name] is a 58 y.o. female who presents to the Emergency Department complaining of moderate SOB onset this morning with associated nausea diaphoresis and palpitations. She states the SOB and palpitations have mostly resolved. She states the SOB today lasted longer and was more severe than usual. She reports bilateral leg swelling in the last week. She states she gets SOB walking from one room to another at baseline. She reports having palpitations intermittently since January. She is being followed by cardiology. She denies weight change, abdominal pain, diarrhea. She denies recent change in medications.    Past Medical History  Diagnosis Date  . Microscopic colitis 9/11    Colonoscopy  . Hemorrhoids   . Diverticulosis   . Schatzki's ring     Last EGD with esophageal dilatation 64F  9/11  . Congenital heart disease   . Paroxysmal SVT (supraventricular tachycardia)   . IDA (iron deficiency anemia)   . GERD (gastroesophageal reflux disease)   . Hypothyroidism   . Hypertension   . Hyperlipidemia   . Renal insufficiency   . Gout   . PVD (peripheral vascular disease)   . Hyperparathyroidism   . Scoliosis   . Chronic bronchitis   . Varicose veins right leg pain and swelling  . Atrial fibrillation Jan. 2015  . Fall due to stumbling October 08, 2013    Due to shoes  . Diabetes mellitus without complication     Type II   Past Surgical History  Procedure Laterality Date  . Asd repair  Age [facility]  . Right common femoral endarterectomy  2007  . Left  parathyroidectomy  2007  . Parathyroid adenoma  2007  . Left hemithyroidectomy    . Tonsillectomy    . Hemorrhoid surgery  2010  . Colonoscopy  03/04/2010    anal canal hemorrhoids otherwise normal. TI normal. Bx showed lymphocytic colitis  . Esophagogastroduodenoscopy  03/04/2010    noncritical appearing Schatzki ring. SB bx negative  . Elas  06-01-11    Right saphenous ELAS    Family History  Problem Relation Age of Onset  . Stroke Mother   . Heart disease Mother     CHF  - Amputation-Right Leg  . Hyperlipidemia Mother   . Hypertension Mother   . Lung cancer Father   . Cancer Father     Lung   History  Substance Use Topics  . Smoking status: Never Smoker   . Smokeless tobacco: Never Used  . Alcohol Use: No   OB History   Grav Para Term Preterm Abortions TAB SAB Ect Mult Living                 Review of Systems  Constitutional:       Per HPI, otherwise negative  HENT:       Per HPI, otherwise negative  Respiratory:       Per HPI, otherwise negative  Cardiovascular:       Per HPI, otherwise negative  Gastrointestinal: Negative for  vomiting.  Endocrine:       Negative aside from HPI  Genitourinary:       Neg aside from HPI   Musculoskeletal:       Per HPI, otherwise negative  Skin: Negative.   Neurological: Negative for syncope.      Allergies  Hydrocodone-acetaminophen; Ibuprofen; Oxycodone hcl; and Penicillins  Home Medications   Prior to Admission medications   Medication Sig Start Date End Date Taking? Authorizing Provider  acetaminophen (TYLENOL) 500 MG tablet Take 500 mg by mouth every 6 (six) hours as needed.    Historical Provider, MD  allopurinol (ZYLOPRIM) 100 MG tablet Take 1 tablet by mouth Twice daily. 09/14/10   Historical Provider, MD  calcium carbonate (OS-CAL) 600 MG TABS Take 600 mg by mouth 2 (two) times daily with a meal.      Historical Provider, MD  cholecalciferol (VITAMIN D) 1000 UNITS tablet Take 1,000 Units by mouth daily.     Historical Provider, MD  ciprofloxacin (CIPRO) 500 MG tablet  09/09/13   Historical Provider, MD  COLCRYS 0.6 MG tablet Take 1 tablet by mouth Twice daily. 09/28/10   Historical Provider, MD  fenofibrate 160 MG tablet Take 1 tablet by mouth daily. 09/26/10   Historical Provider, MD  fish oil-omega-3 fatty acids 1000 MG capsule Take 1 g by mouth 2 (two) times daily.      Historical Provider, MD  furosemide (LASIX) 20 MG tablet Take 1 tablet (20 mg total) by mouth as needed (For swelling). 08/27/13   [name], MD  glipiZIDE (GLUCOTROL XL) 2.5 MG 24 hr tablet  03/27/13   Historical Provider, MD  levothyroxine (SYNTHROID, LEVOTHROID) 112 MCG tablet Take 112 mcg by mouth daily before breakfast.    Historical Provider, MD  loperamide (IMODIUM A-D) 2 MG tablet Take 2 mg by mouth 4 (four) times daily as needed for diarrhea or loose stools.    Historical Provider, MD  losartan (COZAAR) 25 MG tablet Take 12.5 mg by mouth daily.  03/15/13   Historical Provider, MD  Magnesium 250 MG TABS Take by mouth daily.    Historical Provider, MD  metoprolol (LOPRESSOR) 50 MG tablet Take 1.5 tablets (75 mg total) by mouth 2 (two) times daily. 09/12/13   Evans Lance, MD  omeprazole (PRILOSEC) 20 MG capsule Take 1 capsule (20 mg total) by mouth 2 (two) times daily. 04/11/13   Mahala Menghini, PA-C  potassium chloride (K-DUR) 10 MEQ tablet Take 10 mEq by mouth as needed.  06/20/13   Historical Provider, MD  Probiotic Product (ALIGN PO) Take 4 mg by mouth.      Historical Provider, MD  propafenone (RYTHMOL) 150 MG tablet Take 1 tablet (150 mg total) by mouth every 8 (eight) hours. As needed for palpitations 09/23/13   Evans Lance, MD  Rivaroxaban (XARELTO) 20 MG TABS tablet Take 1 tablet (20 mg total) by mouth daily with supper. 08/25/13   [name], MD  sitaGLIPtin (JANUVIA) 50 MG tablet Take 50 mg by mouth daily.      Historical Provider, MD  SSD 1 % cream  09/09/13   Historical Provider, MD  zolpidem (AMBIEN) 10  MG tablet Take 1 tablet by mouth At bedtime as needed. 08/02/10   Historical Provider, MD   Pulse 106  Resp 20  Ht 4' 10.5" (1.486 m)  Wt 151 lb (68.493 kg)  BMI 31.02 kg/m2  SpO2 91% Physical Exam  Nursing note and vitals reviewed. Constitutional: She  is oriented to person, place, and time. She appears well-developed and well-nourished. No distress.  HENT:  Head: Normocephalic and atraumatic.  Eyes: Conjunctivae and EOM are normal.  Cardiovascular: An irregularly irregular rhythm present. Tachycardia present.   Pulmonary/Chest: Effort normal and breath sounds normal. No stridor. No respiratory distress.  Abdominal: She exhibits no distension.  Musculoskeletal: She exhibits no edema.  Neurological: She is alert and oriented to person, place, and time. No cranial nerve deficit.  Skin: Skin is warm and dry.  Psychiatric: She has a normal mood and affect.    ED Course  Procedures (including critical care time) Medications  furosemide (LASIX) injection 40 mg (not administered)  sodium chloride 0.9 % bolus 500 mL (not administered)  labetalol (NORMODYNE,TRANDATE) injection 10 mg (10 mg Intravenous Given 11/06/13 2150)    COORDINATION OF CARE: 9:23 PM- Discussed treatment plan with pt. Pt agrees to plan.    Labs Review Labs Reviewed  COMPREHENSIVE METABOLIC PANEL - Abnormal; Notable for the following:    Sodium 131 (*)    Potassium 5.9 (*)    Chloride 94 (*)    Glucose, Bld 196 (*)    BUN 24 (*)    Creatinine, Ser 1.71 (*)    Alkaline Phosphatase 31 (*)    GFR calc non Af Amer 32 (*)    GFR calc Af Amer 37 (*)    All other components within normal limits  PRO B NATRIURETIC PEPTIDE - Abnormal; Notable for the following:    Pro B Natriuretic peptide (BNP) 5085.0 (*)    All other components within normal limits  CBC WITH DIFFERENTIAL  LIPASE, BLOOD  TROPONIN I  URINALYSIS, ROUTINE W REFLEX MICROSCOPIC  TSH  T4, FREE    Imaging Review Dg Chest 2 View  11/06/2013    CLINICAL DATA:  Shortness of breath and chest discomfort tonight. History congenital heart disease.  EXAM: CHEST  2 VIEW  COMPARISON:  DG CHEST 2 VIEW dated 09/06/2011  FINDINGS: Cardiac enlargement with mild central and basilar interstitial changes possibly representing edema. No focal airspace disease or consolidation. No blunting of costophrenic angles. No pneumothorax. Thoracolumbar scoliosis. Surgical clips in the base of the neck. No significant change since prior study.  IMPRESSION: Cardiac enlargement with probable interstitial edema. No focal consolidation or airspace disease. No change since prior study.   Electronically Signed   By: Lucienne Capers M.D.   On: 11/06/2013 22:26   EKG shows atrial fibrillation, rate 99, right bundle branch block, abnormal  Cardiac monitor shows rate 111, A. fib, abnormal  On repeat exam the patient appears calm, she, her brother and I discussed all results come including hyperkalemia, heart failure exacerbation, and the need for admission.   Update: Following one bolus of beta blocker patient's heart rate dropped transiently into the knees.   Update: On repeat exam the patient appears calm, heart rate 121 abnormal   Update: Patient received Lasix, saline for hyperkalemia, metoprolol for atrial fibrillation with rapid ventricular response.   MDM   I personally performed the services described in this documentation, which was scribed in my presence. The recorded information has been reviewed and is accurate.   This patient presents with dyspnea, fatigue.  On exam patient is awake alert, though she has irregularly irregular rhythm with tachycardia.  The patient is on anticoagulant, but with her persistent rapid atrial fibrillation, hyperkalemia, heart failure exacerbation restrict required admission for further evaluation and management after treatment of her hyperkalemia in the emergency department.  Carmin Muskrat, MD 11/07/13 [phone]

## 2013-11-07 ENCOUNTER — Encounter (HOSPITAL_COMMUNITY): Payer: Self-pay | Admitting: Internal Medicine

## 2013-11-07 DIAGNOSIS — I5189 Other ill-defined heart diseases: Secondary | ICD-10-CM | POA: Diagnosis present

## 2013-11-07 DIAGNOSIS — N189 Chronic kidney disease, unspecified: Secondary | ICD-10-CM

## 2013-11-07 DIAGNOSIS — E875 Hyperkalemia: Secondary | ICD-10-CM | POA: Diagnosis present

## 2013-11-07 DIAGNOSIS — I509 Heart failure, unspecified: Secondary | ICD-10-CM

## 2013-11-07 DIAGNOSIS — E119 Type 2 diabetes mellitus without complications: Secondary | ICD-10-CM | POA: Diagnosis present

## 2013-11-07 DIAGNOSIS — I4891 Unspecified atrial fibrillation: Principal | ICD-10-CM

## 2013-11-07 LAB — BASIC METABOLIC PANEL
BUN: 22 mg/dL (ref 6–23)
CALCIUM: 9.6 mg/dL (ref 8.4–10.5)
CO2: 28 mEq/L (ref 19–32)
CREATININE: 1.5 mg/dL — AB (ref 0.50–1.10)
Chloride: 99 mEq/L (ref 96–112)
GFR, EST AFRICAN AMERICAN: 44 mL/min — AB (ref >90)
GFR, EST NON AFRICAN AMERICAN: 38 mL/min — AB (ref >90)
Glucose, Bld: 73 mg/dL (ref 70–99)
Potassium: 4 mEq/L (ref 3.7–5.3)
Sodium: 141 mEq/L (ref 137–147)

## 2013-11-07 LAB — GLUCOSE, CAPILLARY
GLUCOSE-CAPILLARY: 96 mg/dL (ref 70–99)
GLUCOSE-CAPILLARY: 60 mg/dL — AB (ref 70–99)
Glucose-Capillary: 86 mg/dL (ref 70–99)
Glucose-Capillary: 70 mg/dL (ref 70–99)
Glucose-Capillary: 80 mg/dL (ref 70–99)
Glucose-Capillary: 171 mg/dL — ABNORMAL HIGH (ref 70–99)

## 2013-11-07 LAB — TROPONIN I
Troponin I: <0.3 ng/mL (ref <0.30)
Troponin I: <0.3 ng/mL (ref <0.30)

## 2013-11-07 LAB — MRSA PCR SCREENING: MRSA by PCR: NEGATIVE

## 2013-11-07 LAB — TSH: TSH: 6.64 u[IU]/mL — AB (ref 0.350–4.500)

## 2013-11-07 LAB — T4, FREE: FREE T4: 1.48 ng/dL (ref 0.80–1.80)

## 2013-11-07 MED ORDER — FENOFIBRATE 160 MG PO TABS
160.0000 mg | ORAL_TABLET | Freq: Every day | ORAL | Status: DC
  Administered 2013-11-07 – 2013-11-15 (×8): 160 mg via ORAL
  Filled 2013-11-07 (×12): qty 1

## 2013-11-07 MED ORDER — METOPROLOL TARTRATE 50 MG PO TABS
75.0000 mg | ORAL_TABLET | Freq: Two times a day (BID) | ORAL | Status: DC
  Administered 2013-11-07: 75 mg via ORAL
  Filled 2013-11-07 (×4): qty 1

## 2013-11-07 MED ORDER — ACETAMINOPHEN 500 MG PO TABS
500.0000 mg | ORAL_TABLET | ORAL | Status: DC | PRN
  Administered 2013-11-08 – 2013-11-14 (×8): 500 mg via ORAL
  Filled 2013-11-07 (×8): qty 1

## 2013-11-07 MED ORDER — OMEGA-3-ACID ETHYL ESTERS 1 G PO CAPS
1.0000 | ORAL_CAPSULE | Freq: Two times a day (BID) | ORAL | Status: DC
  Administered 2013-11-07 – 2013-11-15 (×16): 1 g via ORAL
  Filled 2013-11-07 (×16): qty 1

## 2013-11-07 MED ORDER — RIVAROXABAN 20 MG PO TABS: 20.0000 mg | ORAL_TABLET | Freq: Every day | ORAL | Status: DC

## 2013-11-07 MED ORDER — FUROSEMIDE 10 MG/ML IJ SOLN
20.0000 mg | Freq: Two times a day (BID) | INTRAMUSCULAR | Status: DC
Start: 2013-11-07 — End: 2013-11-07
  Administered 2013-11-07 (×2): 20 mg via INTRAMUSCULAR
  Filled 2013-11-07 (×2): qty 2

## 2013-11-07 MED ORDER — ZOLPIDEM TARTRATE 5 MG PO TABS
10.0000 mg | ORAL_TABLET | Freq: Every evening | ORAL | Status: DC | PRN
  Administered 2013-11-10 – 2013-11-14 (×3): 10 mg via ORAL
  Filled 2013-11-07 (×3): qty 2

## 2013-11-07 MED ORDER — CALCIUM CARBONATE 1250 (500 CA) MG PO TABS
1.0000 | ORAL_TABLET | Freq: Two times a day (BID) | ORAL | Status: DC
  Administered 2013-11-07 – 2013-11-15 (×16): 500 mg via ORAL
  Filled 2013-11-07 (×16): qty 1

## 2013-11-07 MED ORDER — SODIUM POLYSTYRENE SULFONATE 15 GM/60ML PO SUSP
15.0000 g | Freq: Once | ORAL | Status: AC
  Administered 2013-11-07: 15 g via ORAL
  Filled 2013-11-07: qty 60

## 2013-11-07 MED ORDER — INSULIN ASPART 100 UNIT/ML ~~LOC~~ SOLN
0.0000 [IU] | Freq: Three times a day (TID) | SUBCUTANEOUS | Status: DC
  Administered 2013-11-08 – 2013-11-11 (×3): 2 [IU] via SUBCUTANEOUS
  Administered 2013-11-12: 3 [IU] via SUBCUTANEOUS
  Administered 2013-11-12: 2 [IU] via SUBCUTANEOUS
  Administered 2013-11-13: 5 [IU] via SUBCUTANEOUS
  Administered 2013-11-14: 3 [IU] via SUBCUTANEOUS

## 2013-11-07 MED ORDER — ALLOPURINOL 100 MG PO TABS
50.0000 mg | ORAL_TABLET | Freq: Two times a day (BID) | ORAL | Status: DC
  Administered 2013-11-07 – 2013-11-15 (×16): 50 mg via ORAL
  Filled 2013-11-07 (×16): qty 1

## 2013-11-07 MED ORDER — RIVAROXABAN 15 MG PO TABS
15.0000 mg | ORAL_TABLET | Freq: Every day | ORAL | Status: DC
  Administered 2013-11-07 – 2013-11-14 (×8): 15 mg via ORAL
  Filled 2013-11-07 (×8): qty 1

## 2013-11-07 MED ORDER — BIOTENE DRY MOUTH MT LIQD
15.0000 mL | Freq: Two times a day (BID) | OROMUCOSAL | Status: DC
  Administered 2013-11-07 – 2013-11-15 (×15): 15 mL via OROMUCOSAL

## 2013-11-07 MED ORDER — PANTOPRAZOLE SODIUM 40 MG PO TBEC
40.0000 mg | DELAYED_RELEASE_TABLET | Freq: Every day | ORAL | Status: DC
  Administered 2013-11-07 – 2013-11-15 (×8): 40 mg via ORAL
  Filled 2013-11-07 (×9): qty 1

## 2013-11-07 MED ORDER — INSULIN ASPART 100 UNIT/ML ~~LOC~~ SOLN: 0.0000 [IU] | Freq: Every day | SUBCUTANEOUS | Status: DC

## 2013-11-07 MED ORDER — LEVOTHYROXINE SODIUM 112 MCG PO TABS
112.0000 ug | ORAL_TABLET | Freq: Every day | ORAL | Status: DC
  Administered 2013-11-07 – 2013-11-15 (×8): 112 ug via ORAL
  Filled 2013-11-07 (×12): qty 1

## 2013-11-07 MED ORDER — ONDANSETRON HCL 4 MG PO TABS
4.0000 mg | ORAL_TABLET | Freq: Four times a day (QID) | ORAL | Status: DC | PRN
  Administered 2013-11-09: 4 mg via ORAL
  Filled 2013-11-07: qty 1

## 2013-11-07 MED ORDER — LINAGLIPTIN 5 MG PO TABS: 5.0000 mg | ORAL_TABLET | Freq: Every day | ORAL | Status: DC

## 2013-11-07 MED ORDER — METOPROLOL TARTRATE 50 MG PO TABS
100.0000 mg | ORAL_TABLET | Freq: Two times a day (BID) | ORAL | Status: DC
  Administered 2013-11-07 – 2013-11-12 (×11): 100 mg via ORAL
  Filled 2013-11-07 (×11): qty 2

## 2013-11-07 MED ORDER — DILTIAZEM HCL 25 MG/5ML IV SOLN
10.0000 mg | Freq: Once | INTRAVENOUS | Status: AC
  Administered 2013-11-07: 10 mg via INTRAVENOUS
  Filled 2013-11-07: qty 5

## 2013-11-07 MED ORDER — METOPROLOL TARTRATE 25 MG PO TABS
25.0000 mg | ORAL_TABLET | Freq: Once | ORAL | Status: AC
  Administered 2013-11-07: 25 mg via ORAL
  Filled 2013-11-07: qty 1

## 2013-11-07 MED ORDER — GLIPIZIDE ER 2.5 MG PO TB24
2.5000 mg | ORAL_TABLET | Freq: Every day | ORAL | Status: DC
  Filled 2013-11-07: qty 1

## 2013-11-07 MED ORDER — ALIGN PO CAPS: 1.0000 | ORAL_CAPSULE | Freq: Every day | ORAL | Status: DC

## 2013-11-07 MED ORDER — FUROSEMIDE 10 MG/ML IJ SOLN
20.0000 mg | Freq: Two times a day (BID) | INTRAMUSCULAR | Status: DC
  Administered 2013-11-08: 20 mg via INTRAVENOUS
  Filled 2013-11-07: qty 2

## 2013-11-07 MED ORDER — VITAMIN D 1000 UNITS PO TABS
1000.0000 [IU] | ORAL_TABLET | Freq: Every day | ORAL | Status: DC
  Administered 2013-11-07 – 2013-11-15 (×8): 1000 [IU] via ORAL
  Filled 2013-11-07 (×8): qty 1

## 2013-11-07 MED ORDER — RISAQUAD PO CAPS
1.0000 | ORAL_CAPSULE | Freq: Every day | ORAL | Status: DC
  Administered 2013-11-07 – 2013-11-15 (×8): 1 via ORAL
  Filled 2013-11-07 (×13): qty 1

## 2013-11-07 MED ORDER — FUROSEMIDE 20 MG PO TABS: 20.0000 mg | ORAL_TABLET | Freq: Every day | ORAL | Status: DC

## 2013-11-07 MED ORDER — CALCIUM CARBONATE 600 MG PO TABS
600.0000 mg | ORAL_TABLET | Freq: Two times a day (BID) | ORAL | Status: DC
  Filled 2013-11-07: qty 1

## 2013-11-07 MED ORDER — ONDANSETRON HCL 4 MG/2ML IJ SOLN: 4.0000 mg | Freq: Four times a day (QID) | INTRAMUSCULAR | Status: DC | PRN

## 2013-11-07 MED ORDER — SODIUM CHLORIDE 0.9 % IJ SOLN
3.0000 mL | Freq: Two times a day (BID) | INTRAMUSCULAR | Status: DC
  Administered 2013-11-07 – 2013-11-15 (×15): 3 mL via INTRAVENOUS

## 2013-11-07 MED ORDER — FUROSEMIDE 10 MG/ML IJ SOLN: 40.0000 mg | Freq: Every day | INTRAMUSCULAR | Status: DC

## 2013-11-07 MED ORDER — PROPAFENONE HCL 150 MG PO TABS
150.0000 mg | ORAL_TABLET | Freq: Three times a day (TID) | ORAL | Status: DC
  Administered 2013-11-07 – 2013-11-11 (×14): 150 mg via ORAL
  Filled 2013-11-07 (×18): qty 1

## 2013-11-07 NOTE — ED Notes (Signed)
Family at bedside. Patient states that she feels better than when she first came in. States that she is not having any pain at this time. Patient states that with exertion she becomes short winded and it makes it hard to breath.

## 2013-11-07 NOTE — Progress Notes (Signed)
Patient CBG this am was 60. Patient was alert, oriented, asymptomatic. 4 ounces of orange juice and 1 packet of graham crackers given. CBG recheck 15 min later, CBG now 86. Patient currently eating breakfast with no complaints. Will continue to monitor.

## 2013-11-07 NOTE — H&P (Signed)
[facility] History and Physical  [name] [id number] DOB: 1956/04/17    PCP:   Delphina Cahill, MD   Chief Complaint: shortness of breath for the past few days.  HPI: [name] is an 58 y.o. female with hx of PAF, on rhythmol and xarelto, hx of HTN, congenital heart disease, PVD, CKD, DM2, GERD, shatzki's ring, colitis, ataxia, brought to the ER as she has been short of breath for the past few days, especially last night.  She was waking up at 5am short of breath.  She had no chest pain, but felt that her heart was beating quite fast.  Late last year, her nephrologist told her to stop the Lasix, as her renal fx tests were getting worse, and she only takes PRN lasix for leg swelling, which ended up being about 2 to 3 times weekly.  She doesn't know her ideal weight.  She denied fever, chills, or coughs.  In the ER, she was intermittently has RVR with HR of 130, but it fluctuates.  Evalaution in the ER showed Cr of 1.7, K of 5.9 with EKG showing aflutter, no peak Ts or wide QRS, CXR showed interstitial edema, and BNP of 5085.  She was given oxygen and Lasix, and felt better.  Hospitalist was asked to admit her for CHF, CKD with hyperkalemia, and afib with RVR.  In the past, she had taken Flecanide, but had felt more palpitation, so she is now on Rhythmol.  She has been compliance with her meds.  Rewiew of Systems:  Constitutional: Negative for malaise, fever and chills. No significant weight loss or weight gain Eyes: Negative for eye pain, redness and discharge, diplopia, visual changes, or flashes of light. ENMT: Negative for ear pain, hoarseness, nasal congestion, sinus pressure and sore throat. No headaches; tinnitus, drooling, or problem swallowing. Cardiovascular: Negative for chest pain, diaphoresis. Respiratory: Negative for cough, hemoptysis, wheezing and stridor. No pleuritic chestpain. Gastrointestinal: Negative for nausea, vomiting, diarrhea, constipation, abdominal  pain, melena, blood in stool, hematemesis, jaundice and rectal bleeding.    Genitourinary: Negative for frequency, dysuria, incontinence,flank pain and hematuria; Musculoskeletal: Negative for back pain and neck pain. Negative for swelling and trauma.;  Skin: . Negative for pruritus, rash, abrasions, bruising and skin lesion.; ulcerations Neuro: Negative for headache, lightheadedness and neck stiffness. Negative for weakness, altered level of consciousness , altered mental status, extremity weakness, burning feet, involuntary movement, seizure and syncope.  Psych: negative for anxiety, depression, insomnia, tearfulness, panic attacks, hallucinations, paranoia, suicidal or homicidal ideation    Past Medical History  Diagnosis Date  . Microscopic colitis 9/11    Colonoscopy  . Hemorrhoids   . Diverticulosis   . Schatzki's ring     Last EGD with esophageal dilatation 35F  9/11  . Congenital heart disease   . Paroxysmal SVT (supraventricular tachycardia)   . IDA (iron deficiency anemia)   . GERD (gastroesophageal reflux disease)   . Hypothyroidism   . Hypertension   . Hyperlipidemia   . Renal insufficiency   . Gout   . PVD (peripheral vascular disease)   . Hyperparathyroidism   . Scoliosis   . Chronic bronchitis   . Varicose veins right leg pain and swelling  . Atrial fibrillation Jan. 2015  . Fall due to stumbling October 08, 2013    Due to shoes  . Diabetes mellitus without complication     Type II    Past Surgical History  Procedure Laterality Date  . Asd repair  Age 86    [facility]  . Right common femoral endarterectomy  2007  . Left parathyroidectomy  2007  . Parathyroid adenoma  2007  . Left hemithyroidectomy    . Tonsillectomy    . Hemorrhoid surgery  2010  . Colonoscopy  03/04/2010    anal canal hemorrhoids otherwise normal. TI normal. Bx showed lymphocytic colitis  . Esophagogastroduodenoscopy  03/04/2010    noncritical appearing  Schatzki ring. SB bx negative  . Elas  06-01-11    Right saphenous ELAS     Medications:  HOME MEDS: Prior to Admission medications   Medication Sig Start Date End Date Taking? Authorizing Provider  acetaminophen (TYLENOL) 500 MG tablet Take 500 mg by mouth every 6 (six) hours as needed.    Historical Provider, MD  allopurinol (ZYLOPRIM) 100 MG tablet Take 1 tablet by mouth Twice daily. 09/14/10   Historical Provider, MD  calcium carbonate (OS-CAL) 600 MG TABS Take 600 mg by mouth 2 (two) times daily with a meal.      Historical Provider, MD  cholecalciferol (VITAMIN D) 1000 UNITS tablet Take 1,000 Units by mouth daily.    Historical Provider, MD  ciprofloxacin (CIPRO) 500 MG tablet  09/09/13   Historical Provider, MD  COLCRYS 0.6 MG tablet Take 1 tablet by mouth Twice daily. 09/28/10   Historical Provider, MD  fenofibrate 160 MG tablet Take 1 tablet by mouth daily. 09/26/10   Historical Provider, MD  fish oil-omega-3 fatty acids 1000 MG capsule Take 1 g by mouth 2 (two) times daily.      Historical Provider, MD  furosemide (LASIX) 20 MG tablet Take 1 tablet (20 mg total) by mouth as needed (For swelling). 08/27/13   [name], MD  glipiZIDE (GLUCOTROL XL) 2.5 MG 24 hr tablet  03/27/13   Historical Provider, MD  levothyroxine (SYNTHROID, LEVOTHROID) 112 MCG tablet Take 112 mcg by mouth daily before breakfast.    Historical Provider, MD  loperamide (IMODIUM A-D) 2 MG tablet Take 2 mg by mouth 4 (four) times daily as needed for diarrhea or loose stools.    Historical Provider, MD  losartan (COZAAR) 25 MG tablet Take 12.5 mg by mouth daily.  03/15/13   Historical Provider, MD  Magnesium 250 MG TABS Take by mouth daily.    Historical Provider, MD  metoprolol (LOPRESSOR) 50 MG tablet Take 1.5 tablets (75 mg total) by mouth 2 (two) times daily. 09/12/13   Evans Lance, MD  omeprazole (PRILOSEC) 20 MG capsule Take 1 capsule (20 mg total) by mouth 2 (two) times daily. 04/11/13   Mahala Menghini, PA-C   potassium chloride (K-DUR) 10 MEQ tablet Take 10 mEq by mouth as needed.  06/20/13   Historical Provider, MD  Probiotic Product (ALIGN PO) Take 4 mg by mouth.      Historical Provider, MD  propafenone (RYTHMOL) 150 MG tablet Take 1 tablet (150 mg total) by mouth every 8 (eight) hours. As needed for palpitations 09/23/13   Evans Lance, MD  Rivaroxaban (XARELTO) 20 MG TABS tablet Take 1 tablet (20 mg total) by mouth daily with supper. 08/25/13   [name], MD  sitaGLIPtin (JANUVIA) 50 MG tablet Take 50 mg by mouth daily.      Historical Provider, MD  SSD 1 % cream  09/09/13   Historical Provider, MD  zolpidem (AMBIEN) 10 MG tablet Take 1 tablet by mouth At bedtime as needed. 08/02/10   Historical Provider, MD  Allergies:  Allergies  Allergen Reactions  . Hydrocodone-Acetaminophen Nausea Only and Other (See Comments)    Severe headache  . Ibuprofen Other (See Comments)    Kidney disfunction  . Oxycodone Hcl Nausea Only    Headache  . Penicillins Nausea Only and Other (See Comments)    Severe headache    Social History:   reports that she has never smoked. She has never used smokeless tobacco. She reports that she does not drink alcohol or use illicit drugs.  Family History: Family History  Problem Relation Age of Onset  . Stroke Mother   . Heart disease Mother     CHF  - Amputation-Right Leg  . Hyperlipidemia Mother   . Hypertension Mother   . Lung cancer Father   . Cancer Father     Lung     Physical Exam: Filed Vitals:   11/06/13 2147 11/06/13 2155 11/06/13 2300 11/06/13 2330  BP: 123/109 122/98 115/93 121/92  Pulse:  90 104 113  Resp:  18 14 22   Height:      Weight:      SpO2:  100% 100% 100%   Blood pressure 121/92, pulse 113, resp. rate 22, height 4' 10.5" (1.486 m), weight 68.493 kg (151 lb), SpO2 100.00%.  GEN:  Pleasant  patient lying in the stretcher in no acute distress; cooperative with exam. PSYCH:  alert and oriented x4; does not appear anxious  or depressed; affect is appropriate. HEENT: Mucous membranes pink and anicteric; PERRLA; EOM intact; no cervical lymphadenopathy nor thyromegaly or carotid bruit; no JVD; There were no stridor. Neck is very supple. Breasts:: Not examined CHEST WALL: No tenderness CHEST: Normal respiration, mostly clear with slight basilar rales. HEART: Regular rate and rhythm.  There are no murmur, rub, or gallops.   BACK: No kyphosis or scoliosis; no CVA tenderness ABDOMEN: soft and non-tender; no masses, no organomegaly, normal abdominal bowel sounds; no pannus; no intertriginous candida. There is no rebound and no distention. Rectal Exam: Not done EXTREMITIES: No bone or joint deformity; age-appropriate arthropathy of the hands and knees; 1+ edema; no ulcerations.  There is no calf tenderness. Genitalia: not examined PULSES: 2+ and symmetric SKIN: Normal hydration no rash or ulceration CNS: Cranial nerves 2-12 grossly intact no focal lateralizing neurologic deficit.  Speech is fluent; uvula elevated with phonation, facial symmetry and tongue midline. DTR are normal bilaterally, cerebella exam is intact, barbinski is negative and strengths are equaled bilaterally.  No sensory loss.   Labs on Admission:  Basic Metabolic Panel:  Recent Labs Lab 11/06/13 2126  NA 131*  K 5.9*  CL 94*  CO2 21  GLUCOSE 196*  BUN 24*  CREATININE 1.71*  CALCIUM 9.7   Liver Function Tests:  Recent Labs Lab 11/06/13 2126  AST 34  ALT 19  ALKPHOS 31*  BILITOT 1.1  PROT 7.4  ALBUMIN 3.6    Recent Labs Lab 11/06/13 2126  LIPASE 44   No results found for this basename: AMMONIA,  in the last 168 hours CBC:  Recent Labs Lab 11/06/13 2126  WBC 8.5  NEUTROABS 5.2  HGB 13.9  HCT 43.6  MCV 89.5  PLT 277   Cardiac Enzymes:  Recent Labs Lab 11/06/13 2126  TROPONINI <0.30    CBG: No results found for this basename: GLUCAP,  in the last 168 hours   Radiological Exams on Admission: Dg Chest 2  View  11/06/2013   CLINICAL DATA:  Shortness of breath and chest discomfort tonight. History  congenital heart disease.  EXAM: CHEST  2 VIEW  COMPARISON:  DG CHEST 2 VIEW dated 09/06/2011  FINDINGS: Cardiac enlargement with mild central and basilar interstitial changes possibly representing edema. No focal airspace disease or consolidation. No blunting of costophrenic angles. No pneumothorax. Thoracolumbar scoliosis. Surgical clips in the base of the neck. No significant change since prior study.  IMPRESSION: Cardiac enlargement with probable interstitial edema. No focal consolidation or airspace disease. No change since prior study.   Electronically Signed   By: Lucienne Capers M.D.   On: 11/06/2013 22:26    EKG: Independently reviewed. afib with RVR.  No peak Ts no widen QRS's   Assessment/Plan Present on Admission:  . Congestive heart failure . Chronic a-fib . CKD (chronic kidney disease) . Hyperkalemia . Diastolic dysfunction . DM (diabetes mellitus) . CHF (congestive heart failure)  PLAN:  Fluid balance will be a problem.  Currently, she is slightly in CHF, and mostly diastolic.  Her EF was 47 % previously.  She also has CKD, and Cr of 1.7 was the same in March 2015.  I don't think she can go without lasix altogether. Currently, her K is 5.9 while on Cozaar and K supplement.  She also has intermittent rapid HR, but it has not been sustained.  She feel better after Lasix and oxygen.  Will admit her for CHF, with CKD, hyperK, and afib with RVR.  I will hold cozaar and K, and give IV diltiazem boluses PRN for too rapid a heart rate.  Will check TSH and continue her supplement for hypothyroidism.  For her DM, will continue her meds, and use sensitive insulin.  I explained to her and her brother about the fluid balance act, and when she will be discharged, to ask for her ideal weight.  She will be continued on Xarelto for her afib.  She is stable, full code, and will be admitted to SDU under [facility]  service.  Thank you for letting me participate in the care of your nice patient.  Other plans as per orders.  Code Status: FULL CODE.   Orvan Falconer, MD. [facility] Pager [phone] 7pm to 7am.  11/07/2013, 12:17 AM

## 2013-11-07 NOTE — Progress Notes (Signed)
Nutrition Brief Note  Patient identified on the Malnutrition Screening Tool (MST) Report  Wt Readings from Last 15 Encounters:  11/07/13 155 lb 13.8 oz (70.7 kg)  10/17/13 153 lb 4.8 oz (69.536 kg)  10/08/13 150 lb (68.04 kg)  09/12/13 151 lb (68.493 kg)  08/25/13 155 lb 1.9 oz (70.362 kg)  08/06/13 154 lb (69.854 kg)  07/29/13 157 lb 12.8 oz (71.578 kg)  07/15/13 154 lb (69.854 kg)  05/08/13 159 lb (72.122 kg)  04/30/13 159 lb (72.122 kg)  04/11/13 155 lb 3.2 oz (70.398 kg)  10/27/11 146 lb 14.4 oz (66.633 kg)  09/05/11 148 lb 12.8 oz (67.495 kg)  06/08/11 148 lb (67.132 kg)  06/01/11 148 lb (67.132 kg)    Body mass index is 32.02 kg/(m^2). Patient meets criteria for obesity based on current BMI. Her usual weight 155# per pt. Complains of taste changes which she relates to atrial fibrillation (possibly medication related?)   Knowledge deficit related to diet recommendations and Heart Failure idenified.  RD provided "Low Sodium Nutrition Therapy"  Provided brief  Overview of ways to decrease sodium intake in diet. Discouraged intake of processed foods and use of salt shaker. Encouraged fresh fruits and vegetables. RD discussed why it is important for patient to adhere to diet recommendations, and emphasized the role of fluids, foods to avoid, and importance of weighing self daily.   Expect good compliance.  Body mass index is 32.02 kg/(m^2). Pt meets criteria for obesity based on current BMI.  Sodium  Date/Time Value Ref Range Status  11/07/2013  8:00 AM 141  137 - 147 mEq/L Final     DELTA CHECK NOTED  11/06/2013  9:26 PM 131* 137 - 147 mEq/L Final  08/26/2013  8:16 AM 141  135 - 145 mEq/L Final  07/29/2010 141  137 - 147 mmol/L Final     labs are from Dr. Josue Hector office    Potassium  Date/Time Value Ref Range Status  11/07/2013  8:00 AM 4.0  3.7 - 5.3 mEq/L Final  11/06/2013  9:26 PM 5.9* 3.7 - 5.3 mEq/L Final  08/26/2013  8:16 AM 4.6  3.5 - 5.3 mEq/L Final  07/29/2010 3.6    Final    No results found for this basename: phos    Magnesium  Date/Time Value Ref Range Status  04/30/2013 10:02 AM 1.7  1.5 - 2.5 mg/dL Final     Current diet order is Renal/CHO Modified , patient is consuming approximately 50-75% of meals at this time. Labs and medications reviewed. No further nutrition interventions warranted at this time. RD contact information provided. If additional nutrition issues arise, please re-consult RD.   Colman Cater MS,RD,CSG,LDN Office: [phone] Pager: [phone]

## 2013-11-07 NOTE — Consult Note (Signed)
CARDIOLOGY CONSULT NOTE   Patient ID: [name] MRN: [id number] DOB/AGE: 11-19-1955 58 y.o.  Admit Date: 11/06/2013 Referring Physician: PTH Primary Physician: Delphina Cahill, MD Consulting Cardiologist: Carlyle Dolly MD Primary Cardiologist:Marlicia Sroka, Roderic Palau MD EP Cardiologist: Cristopher Peru MD Reason for Consultation: CHF  Clinical Summary [name] is a 58 y.o.female with known history of PAF, long RP tachycardia with intolerance to flecainide, now on Rhythmol, CHADs VASC score of 3, started on Xarelto, hypertension, PVD, with multiple other medical problems. She was admitted atrial flutter with RVR, HR of 130 and CHF with symptoms of shortness of breath, and racing heart rate.     She states symptoms began over a week ago with LE which seemed to worsen with heaviness in her legs. 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 Last evening while at home, she began to feel her heart racing, with associated diaphoresis, and worsening breathing status. She has been medically compliant with Rhythmol (prn) and Xarelto.     On arrival to ER,  BP 123/109, HR 114. She was found to be hyperkalemic 5.9, Na 131. Creatinine of 1.71. Pro-BNP 5,085. CXR demonstrated cardiac enlargement with interstitial edema. She was treated with lasix 40 mg IV, metoprolol 5 mg IV and dilitazem gtt which has now been discontinued. She has diuresed 1.450 cc of urine with improvement in breathing status. Heart rate is better controlled now on metoprolol 75 mg BID.    Allergies  Allergen Reactions  . Hydrocodone-Acetaminophen Nausea Only and Other (See Comments)    Severe headache  . Ibuprofen Other (See Comments)    Kidney disfunction  . Oxycodone Hcl Nausea Only    Headache  . Penicillins Nausea Only and Other (See Comments)    Severe headache    Medications Scheduled Medications: . acidophilus  1  capsule Oral Daily  . allopurinol  50 mg Oral BID  . calcium carbonate  1 tablet Oral BID WC  . calcium carbonate  600 mg Oral BID WC  . cholecalciferol  1,000 Units Oral Daily  . fenofibrate  160 mg Oral Daily  . furosemide  20 mg Oral Daily  . insulin aspart  0-15 Units Subcutaneous TID WC  . insulin aspart  0-5 Units Subcutaneous QHS  . levothyroxine  112 mcg Oral QAC breakfast  . metoprolol  75 mg Oral BID  . omega-3 acid ethyl esters  1 capsule Oral BID  . pantoprazole  40 mg Oral Daily  . propafenone  150 mg Oral 3 times per day  . rivaroxaban  20 mg Oral Q supper  . sodium chloride  3 mL Intravenous Q12H     Infusions:     PRN Medications:  acetaminophen, ondansetron (ZOFRAN) IV, ondansetron, zolpidem   Past Medical History  Diagnosis Date  . Microscopic colitis 9/11    Colonoscopy  . Hemorrhoids   . Diverticulosis   . Schatzki's ring     Last EGD with esophageal dilatation 31F  9/11  . Congenital heart disease   . Paroxysmal SVT (supraventricular tachycardia)   . IDA (iron deficiency anemia)   . GERD (gastroesophageal reflux disease)   . Hypothyroidism   . Hypertension   . Hyperlipidemia   . Renal insufficiency   . Gout   . PVD (peripheral vascular disease)   . Hyperparathyroidism   . Scoliosis   . Chronic bronchitis   .  Varicose veins right leg pain and swelling  . Atrial fibrillation Jan. 2015  . Fall due to stumbling October 08, 2013    Due to shoes  . Diabetes mellitus without complication     Type II    Past Surgical History  Procedure Laterality Date  . Asd repair  Age [facility]  . Right common femoral endarterectomy  2007  . Left parathyroidectomy  2007  . Parathyroid adenoma  2007  . Left hemithyroidectomy    . Tonsillectomy    . Hemorrhoid surgery  2010  . Colonoscopy  03/04/2010    anal canal hemorrhoids otherwise normal. TI normal. Bx showed lymphocytic colitis  . Esophagogastroduodenoscopy   03/04/2010    noncritical appearing Schatzki ring. SB bx negative  . Elas  06-01-11    Right saphenous ELAS     Family History  Problem Relation Age of Onset  . Stroke Mother   . Heart disease Mother     CHF  - Amputation-Right Leg  . Hyperlipidemia Mother   . Hypertension Mother   . Lung cancer Father   . Cancer Father     Lung    Social History [name] reports that she has never smoked. She has never used smokeless tobacco. [name] reports that she does not drink alcohol.  Review of Systems Otherwise reviewed and negative except as outlined.  Physical Examination Blood pressure 103/74, pulse 86, temperature 97.8 F (36.6 C), temperature source Oral, resp. rate 19, height 4' 10.5" (1.486 m), weight 155 lb 13.8 oz (70.7 kg), SpO2 100.00%.  Intake/Output Summary (Last 24 hours) at 11/07/13 0809 Last data filed at 11/07/13 0700  Gross per 24 hour  Intake      0 ml  Output   1751 ml  Net  -1751 ml    Telemetry:At rial fibrillation   GEN: No acute distress HEENT: Conjunctiva and lids normal, oropharynx clear with moist mucosa. Neck: Supple, no elevated JVP or carotid bruits, no thyromegaly. Lungs: Clear to auscultation in the upper lobes, with decreased breath sounds in the bases. NO wheezing.  Cardiac: Iregular rate and rhythm, no S3 or significant systolic murmur, no pericardial rub. Abdomen: Mild distention, nontender, no hepatomegaly, bowel sounds present, no guarding or rebound. Extremities: 2+pitting pre-tibial edema, distal pulses 2+. Skin: Warm and dry. Musculoskeletal: No kyphosis. Neuropsychiatric: Alert and oriented x3, affect grossly appropriate.  Prior Cardiac Testing/Procedures 1. Echocardiogram 04/2013 Study data: Technically difficult study. - Left ventricle: The cavity size was normal. Wall thickness was increased in a pattern of moderate LVH. Systolic function was normal. The estimated ejection fraction was in the range of 60% to 65%. Wall  motion was normal; there were no regional wall motion abnormalities. Indeterminate diastolic function. There is evidence of very elevated left atrial pressure. - Aortic valve: Valve area: 1.81cm^2(VTI). Valve area: 1.7cm^2 (Vmax). - Mitral valve: Mild regurgitation. - Left atrium: The atrium was severely dilated. - Right atrium: The atrium was mildly dilated.  2. NM Stress Test 07/15/2013 IMPRESSION: 1. Abnormal Lexiscan MPI with evidence of small area of ischemia in the distal latera wall/apex  2. Technically difficult gated images, LVEF appears normal at 47%  3. Atrial fibrillation was noted on the EKG tracings, from records this does not appear to be a prior diagnosis for this patient  4. Overall low risk study for major cardiac events, there is a small are of myocardium at jeopardy  3.ABI-08/2013 IMPRESSION: 1. No definitive  evidence of hemodynamically significant vasoocclusive disease affecting either lower extremity. 2. Abnormal waveforms within the bilateral lower extremity arterial systems may be indicative of early atherosclerotic change. 3. Borderline elevated ABIs bilaterally most suggestive of small vessel calcifications most commonly attributable to provided history of diabetes. 4. Incidentally noted irregular heart rate compatible with provided history of atrial fibrillation   Lab Results  Basic Metabolic Panel:  Recent Labs Lab 11/06/13 2126  NA 131*  K 5.9*  CL 94*  CO2 21  GLUCOSE 196*  BUN 24*  CREATININE 1.71*  CALCIUM 9.7    Liver Function Tests:  Recent Labs Lab 11/06/13 2126  AST 34  ALT 19  ALKPHOS 31*  BILITOT 1.1  PROT 7.4  ALBUMIN 3.6    CBC:  Recent Labs Lab 11/06/13 2126  WBC 8.5  NEUTROABS 5.2  HGB 13.9  HCT 43.6  MCV 89.5  PLT 277    Cardiac Enzymes:  Recent Labs Lab 11/06/13 2126  TROPONINI <0.30    BNP: 5,085  Radiology: Dg Chest 2 View  11/06/2013   CLINICAL DATA:  Shortness of breath and chest  discomfort tonight. History congenital heart disease.  EXAM: CHEST  2 VIEW  COMPARISON:  DG CHEST 2 VIEW dated 09/06/2011  FINDINGS: Cardiac enlargement with mild central and basilar interstitial changes possibly representing edema. No focal airspace disease or consolidation. No blunting of costophrenic angles. No pneumothorax. Thoracolumbar scoliosis. Surgical clips in the base of the neck. No significant change since prior study.  IMPRESSION: Cardiac enlargement with probable interstitial edema. No focal consolidation or airspace disease. No change since prior study.   Electronically Signed   By: Lucienne Capers M.D.   On: 11/06/2013 22:26     ECG: Atrial flutter with rate of 101 bpm with non-specific T-wave flattening.   Impression and Recommendations  1.Atrial flutter with RVR:  Now in atrial fib with good rate control on metoprolol 75 mg BID, and is continued on Xarelto. CHAD's VASC score is 4. She denies active bleeding or chest pain. She states she is taking medications as directed.  Uncertain why she had episode at this point.   2. Acute on Chronic Mixed CHF: She is diuresing well from IV lasix. Now on IV 20 mg po..Will go back to IV for the next 24 hours. Creatinine 1.50. She still has evidence of fluid overload, although breathing status is improved. Most recent echo in 04/2013.   3. Hyperkalemia: Now off of ARB and potassium replacement. Improved status to 4.0 this am.   4. CKD Stage III. GFR 37 ml/min.. Creatinine is improving from admission of 1.76, close to baseline at 1.4.   5. Diabetes: Management per PTH  Signed: Phill Myron. Purcell Nails NP [facility] 11/07/2013, 8:09 AM Co-Sign MD  Patient seen and discussed with NP Purcell Nails, agree with her documentation above. 58 yo female with multiple medical problems including parox afib, mildly abnormal lexiscan 06/2013 with mild apical ischemia medically managed, HTN, hyperlipidemia, admitted with SOB. In ER noted to be in afib  with intermittent elevated rates to 130s. She was also found to be hyperkalemia with K 5.9 (repeat 4.0 this morning) and volume overloaded. She had been on ARB and KCl at home according to charting.    Regarding her afib, she did not respond to an initial rate control strategy, and was referred to EP for rhythm control consideration. She did not tolerate flecanide due to worsening of symptoms. She was switched to rhythmol 150mg  tid and continued  on metoprolol 75mg  bid. On this regimen symptoms have been somewhat better but not resolved. Last night in setting of elevated rates she received 10mg  bolus of dilt and also of labetaolol. She has been continued on her home xarelto.   CXR with cardiomegaly and probable interstitial edema. There is no EKG in epic or in paper chart. K 5.9 , Cr 1.7, BUN 24, TSH pending, trop neg x1, Hgb 13.9, Plt 277, GFR 32, pro-BNP 5085. Echo 04/2013 LVEF 60-65%, indeterminate diastolic function with elevated LA pressure, mild MR, and severe LAE.   Will continue her propafenone at current dosing. Will increase metoprolol to 100mg  bid, given extra dose of 25mg  this morning. She may need further titration of her propafenone or consideration for alternative antiarrhythmic however will defer that decision to EP at follow up. Goal this admission will be to get better rate control with more aggressive AV nodal blockade. Change xarelto to 15mg  daily based on renal function. She remains volume overloaded, likely due to afib with elevated rates in setting of diastolic dysfunction. Continue IV lasix.  Zandra Abts MD

## 2013-11-07 NOTE — Care Management Note (Addendum)
    Page 1 of 1   11/14/2013     11:47:41 AM CARE MANAGEMENT NOTE 11/14/2013  Patient:  [name], [name]   Account Number:  [id number]  Date Initiated:  11/07/2013  Documentation initiated by:  Theophilus Kinds  Subjective/Objective Assessment:   Pt admitted from home with CHF. Pt lives alone and brother lives next door. Pt is independent with ADL'.s Pt has a quad cane for prn use.     Action/Plan:   No CM needs noted.   Anticipated DC Date:  11/10/2013   Anticipated DC Plan:  [place]  CM consult      Choice offered to / List presented to:             Status of service:  Completed, signed off Medicare Important Message given?  YES (If response is "NO", the following Medicare IM given date fields will be blank) Date Medicare IM given:  11/07/2013 Date Additional Medicare IM given:  11/10/2013  Discharge Disposition:  HOME/SELF CARE  Per UR Regulation:    If discussed at Long Length of Stay Meetings, dates discussed:   11/11/2013    Comments:  11/14/13 [place], RN BSN CM Pt signed IM today. May discharge over the weekend. No CM needs noted.  11/12/13 Claretha Cooper RN BSN CM iM signed by pt today.  11/10/13 [place], RN BSN CM Pt potential discharge home today. No CM needs noted.  11/07/13 1700 Geneva Bolden RN/CM 11/07/13 [place], Therapist, sports BSN CM

## 2013-11-07 NOTE — Evaluation (Signed)
Physical Therapy Evaluation Patient Details Name: [name] MRN: [id number] DOB: March 01, 1956 Today's Date: 11/07/2013   History of Present Illness  [name] is an 58 y.o. female with hx of PAF, on rhythmol and xarelto, hx of HTN, congenital heart disease, PVD, CKD, DM2, GERD, shatzki's ring, colitis, ataxia, brought to the ER as she has been short of breath for the past few days, especially last night.  She was waking up at 5am short of breath.  She had no chest pain, but felt that her heart was beating quite fast.  Late last year, her nephrologist told her to stop the Lasix, as her renal fx tests were getting worse, and she only takes PRN lasix for leg swelling, which ended up being about 2 to 3 times weekly.  She doesn't know her ideal weight.  She denied fever, chills, or coughs.  In the ER, she was intermittently has RVR with HR of 130, but it fluctuates.  Evalaution in the ER showed Cr of 1.7, K of 5.9 with EKG showing aflutter, no peak Ts or wide QRS, CXR showed interstitial edema, and BNP of 5085.  She was given oxygen and Lasix, and felt better.  Hospitalist was asked to admit her for CHF, CKD with hyperkalemia, and afib with RVR.  In the past, she had taken Flecanide, but had felt more palpitation, so she is now on Rhythmol.  She has been compliance with her meds.  Clinical Impression  Pt is at baseline of function no PT needed    Follow Up Recommendations No PT follow up    Equipment Recommendations  None recommended by PT       Precautions / Restrictions Precautions Precautions: None Restrictions Weight Bearing Restrictions: No      Mobility  Bed Mobility Overal bed mobility: Independent                Transfers Overall transfer level: Independent                  Ambulation/Gait Ambulation/Gait assistance: Modified independent (Device/Increase time) Ambulation Distance (Feet): 120 Feet Assistive device: Straight cane Gait Pattern/deviations:  WFL(Within Functional Limits)   Gait velocity interpretation: at or above normal speed for age/gender            Pertinent Vitals/Pain LBP 3/10    Home Living Family/patient expects to be discharged to:: Private residence Living Arrangements: Alone Available Help at Discharge: Family Type of Home: Apartment Home Access: Stairs to enter Entrance Stairs-Rails: Right Entrance Stairs-Number of Steps: 4   Home Equipment: Cane - quad      rior Function Level of Independence: Independent with assistive device(s)               Hand Dominance   Dominant Hand: Right    Extremity/Trunk Assessment               Lower Extremity Assessment: Overall WFL for tasks assessed         Communication   Communication: No difficulties  Cognition Arousal/Alertness: Awake/alert   Overall Cognitive Status: Within Functional Limits for tasks assessed                               Assessment/Plan    PT Assessment [place] does not need any further PT services  PT Diagnosis     PT Problem List    PT Treatment Interventions     PT Goals (  Current goals can be found in the Care Plan section) Acute Rehab PT Goals PT Goal Formulation: No goals set, d/c therapy    Frequency     Barriers to discharge        Co-evaluation               End of Session Equipment Utilized During Treatment: Gait belt Activity Tolerance: Patient tolerated treatment well Patient left: in chair           Time: 1204-1229 PT Time Calculation (min): 25 min   Charges:   PT Evaluation $Initial PT Evaluation Tier I: 1 Procedure     PT G Codes:          Leeroy Cha 11/07/2013, 12:29 PM

## 2013-11-07 NOTE — Care Management Utilization Note (Signed)
UR completed 

## 2013-11-07 NOTE — Progress Notes (Signed)
PROGRESS NOTE  [name] [id number] DOB: [birth year] DOA: 11/06/2013 PCP: Delphina Cahill, MD  Summary: 58 year old woman with history of paroxysmal atrial fibrillation maintained on Rhythmol and Xarelto who presented with acute on chronic shortness of breath, palpitations, fast heart rate. Initial evaluation revealed hyperkalemia, atrial flutter with variable response and heart failure. Admitted for heart failure, hyperkalemia, rapid heart rate.  Assessment/Plan: 1. Atrial fibrillation with rapid ventricular response.  Treated with IV push metoprolol, labetalol and diltiazem last evening; not on diltiazem infusion. Heart rate currently controlled. Continue Rhythmol, metoprolol and Xarelto. 2. Acute heart failure, presumed diastolic. Appears clinically improved. May have had an element of acute hypoxic respiratory failure. TSH pending. 3. Hyperkalemia. Cozaar and potassium supplementation discontinued. 4. History of deep venous reflux/venous hypertension. Followed by vascular surgery, recently seen 09/2013, no evidence of lower extremity arterial insufficiency. 5. Chronic kidney disease stage III, likely at baseline 6. DM type 2. Appears stable.   Cardiology consultation. Continue current oral rate control agents.  Wean oxygen. Followup TSH.  Check BMP and troponin, followup hyperkalemia. Continue to hold Cozaar potassium supplementation for now.  Lives alone, DOE at baseline but otherwise does well with ADLs. PT consult to assess mobility.  Likely transfer to the floor later today and discharge next 24 hours.  Code Status: full code DVT prophylaxis: Xarelto Family Communication: none present Disposition Plan:   Murray Hodgkins, MD  [facility]  Pager [phone] If 7PM-7AM, please contact night-coverage at [email or web address], password [facility] 11/07/2013, 7:35 AM  LOS: 1 day   Consultants:  Cardiology  Procedures:    Antibiotics:    HPI/Subjective: Feels better this  morning. No chest pain. Breathing better. Not currently short of breath. No abdominal pain, nausea or vomiting  Objective: Filed Vitals:   11/07/13 0300 11/07/13 0400 11/07/13 0500 11/07/13 0600  BP: 106/71 103/77 100/74 103/74  Pulse: 67 72 94 86  Temp:      TempSrc:      Resp: 16 14 20 19   Height:      Weight:      SpO2: 97% 97% 97% 100%    Intake/Output Summary (Last 24 hours) at 11/07/13 0735 Last data filed at 11/07/13 0500  Gross per 24 hour  Intake      0 ml  Output   1450 ml  Net  -1450 ml     Filed Weights   11/06/13 2108 11/07/13 0126  Weight: 68.493 kg (151 lb) 70.7 kg (155 lb 13.8 oz)    Exam:   Afebrile, vital signs are stable. Heart rate currently controlled.  Gen. Appears calm and comfortable.  Psychiatric. Grossly normal mood and affect. Speech fluent and appropriate.  Cardiovascular. Regular. No murmur, rub or gallop. 1-2+ bilateral lower extremity edema. Telemetry atrial fibrillation. Heart rate less than 100.  Respiratory. Clear to auscultation bilaterally. No wheezes, rales or rhonchi. Normal respiratory effort.  Eyes appear grossly unremarkable.  ENT grossly unremarkable.  Data Reviewed:  Urine output 1.45 L.  No labs this morning.  Chest x-ray cardiac enlargement, interstitial edema.  EKG from admission not available for review (not scanned in Epic).  Scheduled Meds: . allopurinol  50 mg Oral BID  . bifidobacterium infantis  1 capsule Oral Daily  . calcium carbonate  600 mg Oral BID WC  . cholecalciferol  1,000 Units Oral Daily  . fenofibrate  160 mg Oral Daily  . furosemide  40 mg Intravenous Daily  . glipiZIDE  2.5 mg Oral Q breakfast  . insulin aspart  0-15 Units Subcutaneous TID WC  . insulin aspart  0-5 Units Subcutaneous QHS  . levothyroxine  112 mcg Oral QAC breakfast  . linagliptin  5 mg Oral Daily  . metoprolol  75 mg Oral BID  . omega-3 acid ethyl esters  1 capsule Oral BID  . pantoprazole  40 mg Oral Daily  .  propafenone  150 mg Oral 3 times per day  . rivaroxaban  20 mg Oral Q supper  . sodium chloride  3 mL Intravenous Q12H   Continuous Infusions:   Principal Problem:   A-fib Active Problems:   Congestive heart failure   Chronic a-fib   CKD (chronic kidney disease)   Hyperkalemia   Diastolic dysfunction   DM (diabetes mellitus)   CHF (congestive heart failure)   Time spent 25 minutes

## 2013-11-08 LAB — BASIC METABOLIC PANEL
BUN: 24 mg/dL — AB (ref 6–23)
CHLORIDE: 94 meq/L — AB (ref 96–112)
CO2: 34 meq/L — AB (ref 19–32)
Calcium: 10.3 mg/dL (ref 8.4–10.5)
Creatinine, Ser: 1.54 mg/dL — ABNORMAL HIGH (ref 0.50–1.10)
GFR calc Af Amer: 42 mL/min — ABNORMAL LOW (ref >90)
GFR calc non Af Amer: 36 mL/min — ABNORMAL LOW (ref >90)
GLUCOSE: 109 mg/dL — AB (ref 70–99)
POTASSIUM: 3.7 meq/L (ref 3.7–5.3)
SODIUM: 139 meq/L (ref 137–147)

## 2013-11-08 LAB — GLUCOSE, CAPILLARY
GLUCOSE-CAPILLARY: 126 mg/dL — AB (ref 70–99)
GLUCOSE-CAPILLARY: 145 mg/dL — AB (ref 70–99)
Glucose-Capillary: 101 mg/dL — ABNORMAL HIGH (ref 70–99)

## 2013-11-08 MED ORDER — MECLIZINE HCL 12.5 MG PO TABS
12.5000 mg | ORAL_TABLET | Freq: Three times a day (TID) | ORAL | Status: DC | PRN
  Administered 2013-11-08 – 2013-11-11 (×7): 12.5 mg via ORAL
  Filled 2013-11-08 (×6): qty 1

## 2013-11-08 MED ORDER — FUROSEMIDE 40 MG PO TABS
40.0000 mg | ORAL_TABLET | Freq: Every day | ORAL | Status: DC
  Administered 2013-11-09 – 2013-11-10 (×2): 40 mg via ORAL
  Filled 2013-11-08 (×2): qty 1

## 2013-11-08 NOTE — Progress Notes (Signed)
PROGRESS NOTE  [name] [id number] DOB: 12-28-1955 DOA: 11/06/2013 PCP: Delphina Cahill, MD  Summary: 58 year old woman with history of paroxysmal atrial fibrillation maintained on Rhythmol and Xarelto who presented with acute on chronic shortness of breath, palpitations, fast heart rate. Initial evaluation revealed hyperkalemia, atrial flutter with variable response and heart failure. Admitted for heart failure, hyperkalemia, rapid heart rate.  Assessment/Plan: 1. Atrial fibrillation with rapid ventricular response.  Borderline control. Asymptomatic at this point. Continue Rhythmol, and Xarelto, metoprolol increased per cardiology. Goal is better rate control with outpatient followup with electrophysiology. 2. Acute heart failure, presumed diastolic. Appears clinically improved.  3. Hyperkalemia. Resolved. Cozaar and potassium supplementation discontinued. 4. History of deep venous reflux/venous hypertension. Followed by vascular surgery, recently seen 09/2013, no evidence of lower extremity arterial insufficiency. 5. Chronic kidney physical therapy. No followup needed. disease stage III, likely at baseline. Stable. Suspect contraction alkalosis secondary to IV diuresis. 6. DM type 2. CBG stable. 7. Obesity. BMI 32   Continue to monitor rate on increased dose of beta blocker. If control improved, may be able to go home in 24 hours.  Change to oral lasix.  Consider transfer to tele later today.  Code Status: full code DVT prophylaxis: Xarelto Family Communication: none present Disposition Plan:   Murray Hodgkins, MD  [facility]  Pager [phone] If 7PM-7AM, please contact night-coverage at [email or web address], password [facility] 11/08/2013, 9:24 AM  LOS: 2 days   Consultants:  Cardiology    Procedures:    Antibiotics:    HPI/Subjective: No issues overnight. No chest pain or shortness of breath. Tolerating diet.  Objective: Filed Vitals:   11/08/13 0500 11/08/13  0600 11/08/13 0700 11/08/13 0800  BP: 107/80 102/66 92/57 106/70  Pulse: 87 62 82 92  Temp:      TempSrc:      Resp: 17 15 30 17   Height:      Weight: 67.6 kg (149 lb 0.5 oz)     SpO2: 100% 100% 100% 100%    Intake/Output Summary (Last 24 hours) at 11/08/13 0924 Last data filed at 11/08/13 0800  Gross per 24 hour  Intake    720 ml  Output   3151 ml  Net  -2431 ml     Filed Weights   11/06/13 2108 11/07/13 0126 11/08/13 0500  Weight: 68.493 kg (151 lb) 70.7 kg (155 lb 13.8 oz) 67.6 kg (149 lb 0.5 oz)    Exam:   Afebrile, vital signs are stable. Heart rate 80-100s  Gen. Appears calm and comfortable, sitting on the side of the bed eating breakfast.  Cardiovascular. Irregular. No murmur, rub or gallop.  Respiratory clear to auscultation bilaterally. No frank wheezes, rales or rhonchi. Normal respiratory effort.  Psychiatric grossly normal mood and affect. Speech fluent and appropriate.  Data Reviewed:  Weight down 1 kg since admission. -4.18 liter since admission.  BUN slightly increased, 24. Creatinine stable 1.54. Chloride degrees, CO2 increased.   Troponins negative.  TSH 6.64. T4 normal.  Scheduled Meds: . acidophilus  1 capsule Oral Daily  . allopurinol  50 mg Oral BID  . antiseptic oral rinse  15 mL Mouth Rinse BID  . calcium carbonate  1 tablet Oral BID WC  . cholecalciferol  1,000 Units Oral Daily  . fenofibrate  160 mg Oral Daily  . furosemide  20 mg Intravenous Q12H  . insulin aspart  0-15 Units Subcutaneous TID WC  . insulin aspart  0-5 Units Subcutaneous QHS  . levothyroxine  112 mcg  Oral QAC breakfast  . metoprolol  100 mg Oral BID  . omega-3 acid ethyl esters  1 capsule Oral BID  . pantoprazole  40 mg Oral Daily  . propafenone  150 mg Oral 3 times per day  . rivaroxaban  15 mg Oral Q supper  . sodium chloride  3 mL Intravenous Q12H   Continuous Infusions:   Principal Problem:   A-fib Active Problems:   Congestive heart failure    Chronic a-fib   CKD (chronic kidney disease)   Hyperkalemia   Diastolic dysfunction   DM (diabetes mellitus)   CHF (congestive heart failure)   Time spent 20 minutes

## 2013-11-09 DIAGNOSIS — N183 Chronic kidney disease, stage 3 unspecified: Secondary | ICD-10-CM

## 2013-11-09 DIAGNOSIS — I4891 Unspecified atrial fibrillation: Secondary | ICD-10-CM

## 2013-11-09 LAB — GLUCOSE, CAPILLARY
GLUCOSE-CAPILLARY: 97 mg/dL (ref 70–99)
GLUCOSE-CAPILLARY: 142 mg/dL — AB (ref 70–99)
Glucose-Capillary: 113 mg/dL — ABNORMAL HIGH (ref 70–99)
Glucose-Capillary: 94 mg/dL (ref 70–99)

## 2013-11-09 NOTE — Progress Notes (Signed)
PROGRESS NOTE  [name] [id number] DOB: [birth year] DOA: 11/06/2013 PCP: Delphina Cahill, MD  Summary: 58 year old woman with history of paroxysmal atrial fibrillation maintained on Rhythmol and Xarelto who presented with acute on chronic shortness of breath, palpitations, fast heart rate. Initial evaluation revealed hyperkalemia, atrial flutter with variable response and heart failure. Admitted for heart failure, hyperkalemia, rapid heart rate.  Assessment/Plan: 1. Atrial fibrillation with rapid ventricular response.  Borderline control. Asymptomatic at this point but still has significant tachycardia times. Continue Rhythmol, and Xarelto, metoprolol increased per cardiology. Goal is better rate control with outpatient followup with electrophysiology. 2. Acute heart failure, presumed diastolic. Appears compensated at this point. 3. Hyperkalemia. Resolved. Cozaar and potassium supplementation discontinued. 4. History of deep venous reflux/venous hypertension. Followed by vascular surgery, recently seen 09/2013, no evidence of lower extremity arterial insufficiency. 5. Chronic kidney disease stage III, likely at baseline. Stable.  6. DM type 2. CBG stable. 7. Obesity. BMI 32   Overall improving with resolution of acute diastolic heart failure improved respiratory status and decreased lower extremity edema with documented weight loss.  Overall heart rate controlled but elevates with movement up into the 140s. Plan to continue metoprolol, continue Rythmol, reevaluate in the morning, doesn't appear BP can tolerate further increase in metoprolol.   Discussed with brother present.  Code Status: full code DVT prophylaxis: Xarelto Family Communication:  Disposition Plan: Home when improved  Murray Hodgkins, MD  [facility]  Pager [phone] If 7PM-7AM, please contact night-coverage at [email or web address], password [facility] 11/09/2013, 5:26 PM  LOS: 3 days    Consultants:  Cardiology  Physical therapy. No followup needed.  Procedures:    Antibiotics:    HPI/Subjective: Overall feeling better with decreasing lower extremity edema. Breathing much better. Still has palpitations at times.  Objective: Filed Vitals:   11/08/13 2049 11/09/13 0354 11/09/13 0900 11/09/13 1335  BP: 112/86 94/66 115/88 96/81  Pulse: 104 84 97 90  Temp: 97.5 F (36.4 C) 97.4 F (36.3 C) 97.9 F (36.6 C)   TempSrc: Oral Oral Oral   Resp: 18 20 20 20   Height:      Weight:  65.363 kg (144 lb 1.6 oz)    SpO2: 97% 99% 96% 98%    Intake/Output Summary (Last 24 hours) at 11/09/13 1726 Last data filed at 11/09/13 1451  Gross per 24 hour  Intake    480 ml  Output   1150 ml  Net   -670 ml     Filed Weights   11/07/13 0126 11/08/13 0500 11/09/13 0354  Weight: 70.7 kg (155 lb 13.8 oz) 67.6 kg (149 lb 0.5 oz) 65.363 kg (144 lb 1.6 oz)    Exam:   Afebrile, vital signs are stable. Heart rate 80-100s but elevates up in the 140s with movement.  Gen. Appears calm and comfortable. Speech fluent and clear.  Cardiovascular. Regular rate and rhythm. No murmur, gallop. No lower extremity edema.  Respiratory clear to auscultation bilaterally. No wheezes, rales or rhonchi. Normal respiratory effort.  PsychiatricNormal mood and affect. Speech fluent and appropriate.  Data Reviewed:  Weight down 3 kg since admission. -6.65 liter since admission.  Scheduled Meds: . acidophilus  1 capsule Oral Daily  . allopurinol  50 mg Oral BID  . antiseptic oral rinse  15 mL Mouth Rinse BID  . calcium carbonate  1 tablet Oral BID WC  . cholecalciferol  1,000 Units Oral Daily  . fenofibrate  160 mg Oral Daily  . furosemide  40 mg Oral  Daily  . insulin aspart  0-15 Units Subcutaneous TID WC  . insulin aspart  0-5 Units Subcutaneous QHS  . levothyroxine  112 mcg Oral QAC breakfast  . metoprolol  100 mg Oral BID  . omega-3 acid ethyl esters  1 capsule Oral BID  .  pantoprazole  40 mg Oral Daily  . propafenone  150 mg Oral 3 times per day  . rivaroxaban  15 mg Oral Q supper  . sodium chloride  3 mL Intravenous Q12H   Continuous Infusions:   Principal Problem:   Atrial fibrillation with RVR Active Problems:   Chronic a-fib   Hyperkalemia   Diastolic dysfunction   DM (diabetes mellitus)   Acute diastolic CHF (congestive heart failure)   CKD (chronic kidney disease), stage III   Time spent 20 minutes

## 2013-11-10 DIAGNOSIS — I739 Peripheral vascular disease, unspecified: Secondary | ICD-10-CM

## 2013-11-10 DIAGNOSIS — E119 Type 2 diabetes mellitus without complications: Secondary | ICD-10-CM

## 2013-11-10 DIAGNOSIS — N183 Chronic kidney disease, stage 3 unspecified: Secondary | ICD-10-CM

## 2013-11-10 DIAGNOSIS — I5031 Acute diastolic (congestive) heart failure: Secondary | ICD-10-CM

## 2013-11-10 LAB — GLUCOSE, CAPILLARY
Glucose-Capillary: 96 mg/dL (ref 70–99)
Glucose-Capillary: 86 mg/dL (ref 70–99)
Glucose-Capillary: 89 mg/dL (ref 70–99)
Glucose-Capillary: 167 mg/dL — ABNORMAL HIGH (ref 70–99)

## 2013-11-10 LAB — BASIC METABOLIC PANEL
BUN: 33 mg/dL — ABNORMAL HIGH (ref 6–23)
CALCIUM: 10.4 mg/dL (ref 8.4–10.5)
CO2: 33 mEq/L — ABNORMAL HIGH (ref 19–32)
CREATININE: 1.71 mg/dL — AB (ref 0.50–1.10)
Chloride: 96 mEq/L (ref 96–112)
GFR calc Af Amer: 37 mL/min — ABNORMAL LOW (ref >90)
GFR calc non Af Amer: 32 mL/min — ABNORMAL LOW (ref >90)
Glucose, Bld: 103 mg/dL — ABNORMAL HIGH (ref 70–99)
Potassium: 4 mEq/L (ref 3.7–5.3)
Sodium: 140 mEq/L (ref 137–147)

## 2013-11-10 NOTE — Progress Notes (Signed)
Consulting cardiologist:Koneswaran, Jamesetta So Primary Cardiologist:Branch, Roderic Palau MD  Subjective:    Complains of racing heart rate during the day. Some dizziness  Objective:   Temp:  [97.6 F (36.4 C)-98.1 F (36.7 C)] 98.1 F (36.7 C) (05/18 1801) Pulse Rate:  [58-97] 80 (05/18 1801) Resp:  [18-20] 18 (05/18 1801) BP: (95-118)/(65-79) 99/67 mmHg (05/18 1801) SpO2:  [96 %-98 %] 97 % (05/18 1801) Weight:  [142 lb (64.411 kg)] 142 lb (64.411 kg) (05/18 0409) Last BM Date: 11/09/13  Filed Weights   11/08/13 0500 11/09/13 0354 11/10/13 0409  Weight: 149 lb 0.5 oz (67.6 kg) 144 lb 1.6 oz (65.363 kg) 142 lb (64.411 kg)    Intake/Output Summary (Last 24 hours) at 11/10/13 1909 Last data filed at 11/10/13 1700  Gross per 24 hour  Intake    480 ml  Output   1900 ml  Net  -1420 ml    Telemetry: NSR with episodes of HR 121- 136 at rest returning to NSR. Marland Kitchen   Exam:  General: No acute distress.  HEENT: Conjunctiva and lids normal, oropharynx clear.  Lungs: Clear to auscultation, nonlabored.  Cardiac: No elevated JVP or bruits. I RRR, no gallop or rub.   Abdomen: Normoactive bowel sounds, nontender, nondistended.  Extremities: No pitting edema, distal pulses full.  Neuropsychiatric: Alert and oriented x3, affect appropriate.   Lab Results:  Basic Metabolic Panel:  Recent Labs Lab 11/07/13 0800 11/08/13 0516 11/10/13 0531  NA 141 139 140  K 4.0 3.7 4.0  CL 99 94* 96  CO2 28 34* 33*  GLUCOSE 73 109* 103*  BUN 22 24* 33*  CREATININE 1.50* 1.54* 1.71*  CALCIUM 9.6 10.3 10.4    Liver Function Tests:  Recent Labs Lab 11/06/13 2126  AST 34  ALT 19  ALKPHOS 31*  BILITOT 1.1  PROT 7.4  ALBUMIN 3.6    CBC:  Recent Labs Lab 11/06/13 2126  WBC 8.5  HGB 13.9  HCT 43.6  MCV 89.5  PLT 277    Cardiac Enzymes:  Recent Labs Lab 11/07/13 0800 11/07/13 1318 11/07/13 1949  TROPONINI <0.30 <0.30 <0.30    BNP:  Recent Labs  11/06/13 2126    PROBNP 5085.0*     Medications:   Scheduled Medications: . acidophilus  1 capsule Oral Daily  . allopurinol  50 mg Oral BID  . antiseptic oral rinse  15 mL Mouth Rinse BID  . calcium carbonate  1 tablet Oral BID WC  . cholecalciferol  1,000 Units Oral Daily  . fenofibrate  160 mg Oral Daily  . furosemide  40 mg Oral Daily  . insulin aspart  0-15 Units Subcutaneous TID WC  . insulin aspart  0-5 Units Subcutaneous QHS  . levothyroxine  112 mcg Oral QAC breakfast  . metoprolol  100 mg Oral BID  . omega-3 acid ethyl esters  1 capsule Oral BID  . pantoprazole  40 mg Oral Daily  . propafenone  150 mg Oral 3 times per day  . rivaroxaban  15 mg Oral Q supper  . sodium chloride  3 mL Intravenous Q12H      PRN Medications: acetaminophen, meclizine, ondansetron (ZOFRAN) IV, ondansetron, zolpidem   Assessment and Plan:   1.Atrial fibrillation with periods of RVR and long RP tachycardia:  She is still having episodes of rapid rate from 121 to 130;s at rest. She is hypotensive and I am reluctant to increase her metoprolol. Can titrate up propafenone, from 150 mg Q 8 hours  but will need to have EP see her for more recommendations. The episodes are transient but she is symptomatic with dizziness. Echo shows normal EF at 60-65%. Will decrease lasix to 20 mg daily in hopes of increasing her blood pressure before titration of medications. Will hold am dose.  Recommend EP to see her ASAP on discharge.   2.Acute on chronic CHF: She appears euvolemic on assessment without evidence of fluid retention. Breath better. Has lost 9 lbs since admission. Has had > 1,320 cc of urine overnight. Creatinine 1.54.this am.   3. CKD Stage III: Creatinine is improved.   4. Diabetes: Per PTH.     Phill Myron. Purcell Nails NP [facility] 11/10/2013, 7:09 PM

## 2013-11-10 NOTE — Progress Notes (Signed)
[name] [id number] DOB: Feb 21, 1956 DOA: 11/06/2013 PCP: Delphina Cahill, MD  Summary: 58 year old woman with history of paroxysmal atrial fibrillation maintained on Rhythmol and Xarelto who presented with acute on chronic shortness of breath, palpitations, fast heart rate. Initial evaluation revealed hyperkalemia, atrial flutter with variable response and heart failure. Admitted for heart failure, hyperkalemia, rapid heart rate. Heart failure quickly resolved as to hyperkalemia. Heart rate has been difficult to control, cardiology assisting with management. Anticipate discharge once heart rate controlled.  Assessment/Plan: 1. Atrial fibrillation with rapid ventricular response.  Borderline control without improvement. Asymptomatic at this point but still has significant tachycardia times. Continue Rhythmol, and Xarelto, metoprolol. Goal is better rate control with outpatient followup with electrophysiology. 2. Acute heart failure, presumed diastolic. Appears euvolemic or slightly dry. 3. Hyperkalemia. Resolved. Cozaar and potassium supplementation discontinued. 4. History of deep venous reflux/venous hypertension. Followed by vascular surgery, recently seen 09/2013, no evidence of lower extremity arterial insufficiency. 5. Chronic kidney disease stage III, likely at baseline. BUN somewhat increased however. Plan hold Lasix as below. 6. DM type 2. CBG stable. 7. Obesity. BMI 32   She appears clinically stable but still has elevated heart rates with movement. Respiratory status is stable and she is euvolemic or perhaps a bit dry. Agree with holding Lasix, further recommendations to come. Home once heart rate control.  Code Status: full code DVT prophylaxis: Xarelto Family Communication:  Disposition Plan: Home when improved  Murray Hodgkins, MD  [facility]  Pager [phone] If 7PM-7AM, please contact night-coverage at [email or web address], password [facility] 11/10/2013, 7:27  PM  LOS: 4 days   Consultants:  Cardiology  Physical therapy. No followup needed.  Procedures:    Antibiotics:    HPI/Subjective: Breathing better. She does report palpitations when she gets to moving. She also has intermittent vertigo.  Objective: Filed Vitals:   11/09/13 2122 11/10/13 0409 11/10/13 1440 11/10/13 1801  BP: 118/79 98/70 95/65  99/67  Pulse: 97 58 77 80  Temp: 97.9 F (36.6 C) 97.6 F (36.4 C) 97.6 F (36.4 C) 98.1 F (36.7 C)  TempSrc: Oral Oral Oral   Resp: 20 20 18 18   Height:      Weight:  64.411 kg (142 lb)    SpO2: 96% 98% 97% 97%    Intake/Output Summary (Last 24 hours) at 11/10/13 1927 Last data filed at 11/10/13 1700  Gross per 24 hour  Intake    480 ml  Output   1900 ml  Net  -1420 ml     Filed Weights   11/08/13 0500 11/09/13 0354 11/10/13 0409  Weight: 67.6 kg (149 lb 0.5 oz) 65.363 kg (144 lb 1.6 oz) 64.411 kg (142 lb)    Exam:   Afebrile, vital signs stable. No hypoxia.  Gen. Appears calm and comfortable. Speech fluent and clear.  Respiratory clear to auscultation bilaterally. No wheezes, rales rhonchi. Normal respiratory effort.  Cardiovascular irregular, tachycardic, no murmur rub or gallop. No lower extremity edema.  Psychiatric. Grossly normal mood no. Speech fluent and appropriate.  Data Reviewed:  Weight down 4 kg since admission. -8 liters since admission.  Creatinine elevated 1.71, BUN somewhat increased 33, CO2 increased 33.  Scheduled Meds: . acidophilus  1 capsule Oral Daily  . allopurinol  50 mg Oral BID  . antiseptic oral rinse  15 mL Mouth Rinse BID  . calcium carbonate  1 tablet Oral BID WC  . cholecalciferol  1,000 Units Oral Daily  . fenofibrate  160 mg Oral  Daily  . furosemide  40 mg Oral Daily  . insulin aspart  0-15 Units Subcutaneous TID WC  . insulin aspart  0-5 Units Subcutaneous QHS  . levothyroxine  112 mcg Oral QAC breakfast  . metoprolol  100 mg Oral BID  . omega-3 acid ethyl  esters  1 capsule Oral BID  . pantoprazole  40 mg Oral Daily  . propafenone  150 mg Oral 3 times per day  . rivaroxaban  15 mg Oral Q supper  . sodium chloride  3 mL Intravenous Q12H   Continuous Infusions:   Principal Problem:   Atrial fibrillation with RVR Active Problems:   Chronic a-fib   Hyperkalemia   Diastolic dysfunction   DM (diabetes mellitus)   Acute diastolic CHF (congestive heart failure)   CKD (chronic kidney disease), stage III   Time spent 15 minutes

## 2013-11-10 NOTE — Progress Notes (Signed)
The patient was seen and examined, and I agree with the assessment and plan as documented above, with modifications as noted below. Pt is now dizzy with changing position in bed and going from sitting to standing. She is hypotensive and I suspect this is both exacerbating HR's as well as symptoms. Recommend holding Lasix tomorrow.  May increase propafenone to 225 mg tid vs increasing metoprolol (but low BP is prohibitive). Will wait to see what BP is in morning before titrating meds further. Discussed with Dr. Sarajane Jews.

## 2013-11-11 ENCOUNTER — Encounter (HOSPITAL_COMMUNITY): Payer: Medicare Other

## 2013-11-11 ENCOUNTER — Other Ambulatory Visit (HOSPITAL_COMMUNITY): Payer: Medicare Other

## 2013-11-11 ENCOUNTER — Ambulatory Visit: Payer: Medicare Other | Admitting: Vascular Surgery

## 2013-11-11 DIAGNOSIS — I519 Heart disease, unspecified: Secondary | ICD-10-CM

## 2013-11-11 DIAGNOSIS — I739 Peripheral vascular disease, unspecified: Secondary | ICD-10-CM

## 2013-11-11 DIAGNOSIS — E875 Hyperkalemia: Secondary | ICD-10-CM

## 2013-11-11 DIAGNOSIS — L98499 Non-pressure chronic ulcer of skin of other sites with unspecified severity: Secondary | ICD-10-CM

## 2013-11-11 LAB — BASIC METABOLIC PANEL
BUN: 39 mg/dL — ABNORMAL HIGH (ref 6–23)
CALCIUM: 10.2 mg/dL (ref 8.4–10.5)
CO2: 35 mEq/L — ABNORMAL HIGH (ref 19–32)
Chloride: 93 mEq/L — ABNORMAL LOW (ref 96–112)
Creatinine, Ser: 1.85 mg/dL — ABNORMAL HIGH (ref 0.50–1.10)
GFR calc Af Amer: 34 mL/min — ABNORMAL LOW (ref >90)
GFR calc non Af Amer: 29 mL/min — ABNORMAL LOW (ref >90)
Glucose, Bld: 108 mg/dL — ABNORMAL HIGH (ref 70–99)
Potassium: 4 mEq/L (ref 3.7–5.3)
Sodium: 138 mEq/L (ref 137–147)

## 2013-11-11 LAB — GLUCOSE, CAPILLARY
GLUCOSE-CAPILLARY: 138 mg/dL — AB (ref 70–99)
Glucose-Capillary: 110 mg/dL — ABNORMAL HIGH (ref 70–99)
Glucose-Capillary: 120 mg/dL — ABNORMAL HIGH (ref 70–99)
Glucose-Capillary: 115 mg/dL — ABNORMAL HIGH (ref 70–99)

## 2013-11-11 MED ORDER — PROPAFENONE HCL 150 MG PO TABS
225.0000 mg | ORAL_TABLET | Freq: Three times a day (TID) | ORAL | Status: DC
  Administered 2013-11-11 – 2013-11-12 (×3): 225 mg via ORAL
  Filled 2013-11-11: qty 1
  Filled 2013-11-11 (×4): qty 2
  Filled 2013-11-11: qty 1
  Filled 2013-11-11: qty 2
  Filled 2013-11-11: qty 1
  Filled 2013-11-11: qty 2
  Filled 2013-11-11: qty 1

## 2013-11-11 NOTE — Progress Notes (Signed)
SUBJECTIVE: Pt still with rapid atrial fibrillation with any amount of exertion, HR 120-140 bpm. Feels "about the same as yesterday".     Intake/Output Summary (Last 24 hours) at 11/11/13 1105 Last data filed at 11/11/13 0827  Gross per 24 hour  Intake    240 ml  Output   1700 ml  Net  -1460 ml    Current Facility-Administered Medications  Medication Dose Route Frequency Provider Last Rate Last Dose  . acetaminophen (TYLENOL) tablet 500 mg  500 mg Oral Q4H PRN Orvan Falconer, MD   500 mg at 11/10/13 2237  . acidophilus (RISAQUAD) capsule 1 capsule  1 capsule Oral Daily Samuella Cota, MD   1 capsule at 11/11/13 1014  . allopurinol (ZYLOPRIM) tablet 50 mg  50 mg Oral BID Orvan Falconer, MD   50 mg at 11/11/13 1015  . antiseptic oral rinse (BIOTENE) solution 15 mL  15 mL Mouth Rinse BID Samuella Cota, MD   15 mL at 11/11/13 1016  . calcium carbonate (OS-CAL - dosed in mg of elemental calcium) tablet 500 mg of elemental calcium  1 tablet Oral BID WC Samuella Cota, MD   500 mg of elemental calcium at 11/11/13 1015  . cholecalciferol (VITAMIN D) tablet 1,000 Units  1,000 Units Oral Daily Orvan Falconer, MD   1,000 Units at 11/11/13 1015  . fenofibrate tablet 160 mg  160 mg Oral Daily Orvan Falconer, MD   160 mg at 11/11/13 1014  . insulin aspart (novoLOG) injection 0-15 Units  0-15 Units Subcutaneous TID WC Orvan Falconer, MD   2 Units at 11/08/13 1706  . insulin aspart (novoLOG) injection 0-5 Units  0-5 Units Subcutaneous QHS Orvan Falconer, MD      . levothyroxine (SYNTHROID, LEVOTHROID) tablet 112 mcg  112 mcg Oral QAC breakfast Orvan Falconer, MD   112 mcg at 11/11/13 1014  . meclizine (ANTIVERT) tablet 12.5 mg  12.5 mg Oral TID PRN Samuella Cota, MD   12.5 mg at 11/11/13 1014  . metoprolol (LOPRESSOR) tablet 100 mg  100 mg Oral BID [name], MD   100 mg at 11/11/13 1017  . omega-3 acid ethyl esters (LOVAZA) capsule 1 g  1 capsule Oral BID Orvan Falconer, MD   1 g at 11/11/13 1014  . ondansetron  (ZOFRAN) tablet 4 mg  4 mg Oral Q6H PRN Orvan Falconer, MD   4 mg at 11/09/13 1219   Or  . ondansetron [facility]) injection 4 mg  4 mg Intravenous Q6H PRN Orvan Falconer, MD      . pantoprazole (PROTONIX) EC tablet 40 mg  40 mg Oral Daily Orvan Falconer, MD   40 mg at 11/11/13 1014  . propafenone (RYTHMOL) tablet 150 mg  150 mg Oral 3 times per day Orvan Falconer, MD   150 mg at 11/11/13 0607  . Rivaroxaban (XARELTO) tablet 15 mg  15 mg Oral Q supper [name], MD   15 mg at 11/10/13 1740  . sodium chloride 0.9 % injection 3 mL  3 mL Intravenous Q12H Orvan Falconer, MD   3 mL at 11/11/13 1016  . zolpidem (AMBIEN) tablet 10 mg  10 mg Oral QHS PRN Orvan Falconer, MD   10 mg at 11/10/13 2237    Filed Vitals:   11/10/13 2237 11/10/13 2239 11/11/13 0433 11/11/13 1017  BP: 133/65 110/74 93/61 114/78  Pulse: 91 87 86   Temp: 98.1 F (36.7 C) 97.5 F (  36.4 C) 97.3 F (36.3 C)   TempSrc:  Oral Oral   Resp: 18 20 20    Height:      Weight:   141 lb 14.4 oz (64.365 kg)   SpO2: 98% 96% 96%     PHYSICAL EXAM General: NAD Neck: No JVD, no thyromegaly.  Lungs: Clear to auscultation bilaterally with normal respiratory effort. CV: Nondisplaced PMI.  Tachycardic, irregular rhythm, normal S1/S2, no S3, no murmur.  No pretibial edema.  Abdomen: Soft, nontender, no hepatosplenomegaly, no distention.  Neurologic: Alert and oriented x 3.  Psych: Normal affect. Extremities: No clubbing or cyanosis.   TELEMETRY: Reviewed telemetry pt in atrial fibrillation, HR 120 bpm currently.  LABS: Basic Metabolic Panel:  Recent Labs  11/10/13 0531 11/11/13 0554  NA 140 138  K 4.0 4.0  CL 96 93*  CO2 33* 35*  GLUCOSE 103* 108*  BUN 33* 39*  CREATININE 1.71* 1.85*  CALCIUM 10.4 10.2   Liver Function Tests: No results found for this basename: AST, ALT, ALKPHOS, BILITOT, PROT, ALBUMIN,  in the last 72 hours No results found for this basename: LIPASE, AMYLASE,  in the last 72 hours CBC: No results found for this basename: WBC,  NEUTROABS, HGB, HCT, MCV, PLT,  in the last 72 hours Cardiac Enzymes: No results found for this basename: CKTOTAL, CKMB, CKMBINDEX, TROPONINI,  in the last 72 hours BNP: No components found with this basename: POCBNP,  D-Dimer: No results found for this basename: DDIMER,  in the last 72 hours Hemoglobin A1C: No results found for this basename: HGBA1C,  in the last 72 hours Fasting Lipid Panel: No results found for this basename: CHOL, HDL, LDLCALC, TRIG, CHOLHDL, LDLDIRECT,  in the last 72 hours Thyroid Function Tests: No results found for this basename: TSH, T4TOTAL, FREET3, T3FREE, THYROIDAB,  in the last 72 hours Anemia Panel: No results found for this basename: VITAMINB12, FOLATE, FERRITIN, TIBC, IRON, RETICCTPCT,  in the last 72 hours  RADIOLOGY: Dg Chest 2 View  11/06/2013   CLINICAL DATA:  Shortness of breath and chest discomfort tonight. History congenital heart disease.  EXAM: CHEST  2 VIEW  COMPARISON:  DG CHEST 2 VIEW dated 09/06/2011  FINDINGS: Cardiac enlargement with mild central and basilar interstitial changes possibly representing edema. No focal airspace disease or consolidation. No blunting of costophrenic angles. No pneumothorax. Thoracolumbar scoliosis. Surgical clips in the base of the neck. No significant change since prior study.  IMPRESSION: Cardiac enlargement with probable interstitial edema. No focal consolidation or airspace disease. No change since prior study.   Electronically Signed   By: Lucienne Capers M.D.   On: 11/06/2013 22:26      ASSESSMENT AND PLAN: 1. Atrial fibrillation: Will increase propafenone to 225 mg tid. Continue metoprolol 100 mg bid. Will need to have her f/u with Dr. Lovena Le (EP) for consideration of additional management strategies (alternative antiarrhythmics vs ablation and pacemaker, etc). Continue renal dosing of Xarelto. 2. Diastolic heart failure: Compensated and euvolemic to dry. No pedal edema. Hold Lasix today and reassess  requirement tomorrow. 3. CKD: BUN/Cr elevated today. Holding Lasix.   Kate Sable, M.D., F.A.C.C.

## 2013-11-11 NOTE — Progress Notes (Signed)
PROGRESS NOTE  [name] [id number] DOB: 04/30/1956 DOA: 11/06/2013 PCP: Delphina Cahill, MD  Summary: 58 year old woman with history of paroxysmal atrial fibrillation maintained on Rhythmol and Xarelto who presented with acute on chronic shortness of breath, palpitations, and fast heart rate. Initial evaluation revealed hyperkalemia, atrial flutter with variable response and heart failure. Admitted for heart failure, hyperkalemia, rapid heart rate. Heart failure quickly resolved as did hyperkalemia. Heart rate has been difficult to control; Cardiology assisting with management. Anticipate discharge once heart rate controlled.  Assessment/Plan: 1. Atrial fibrillation with rapid ventricular response.  Borderline control without improvement. Asymptomatic at this point but still has significant tachycardia even when resting. Continue Rhythmol, Xarelto, and metoprolol. Goal is better rate control with outpatient followup with electrophysiology. 2. Acute heart failure, presumed diastolic. Appears slightly dry. Holding lasix. 3. Hyperkalemia. Resolved. Cozaar and potassium supplementation discontinued due to climbing crt 4. History of deep venous reflux/venous hypertension. Followed by vascular surgery, recently seen 09/2013, no evidence of lower extremity arterial insufficiency. 5. Chronic kidney disease stage III - baseline crt ~1.8 as of March 2015, but as low as 1.2 earlier in the year - agree w/ holding lasix - recheck crt in AM - may require gently fluid if not improved  6. DM type 2. CBG stable - no change in tx plan today  7. Obesity - Body mass index is 29.15 kg/(m^2).  Code Status: FULL DVT prophylaxis: Xarelto Family Communication: spoke w/ pt and brother at bedside  Disposition Plan: Home when improved  Cherene Altes, MD [facility] For Consults/Admissions - Flow Manager - [phone] Office  [phone] Pager [phone]  On-Call/Text Page:      [email or web address]  password [facility]  11/11/2013, 4:23 PM  LOS: 5 days   Consultants:  Cardiology  Procedures:  none  Antibiotics:  none  HPI/Subjective: She c/o feeling very weak.  Admits to some dizziness when attempting to stand/ambulate.  Denies CP, n/v, or abdom pain.    Objective: Filed Vitals:   11/10/13 2237 11/10/13 2239 11/11/13 0433 11/11/13 1017  BP: 133/65 110/74 93/61 114/78  Pulse: 91 87 86   Temp: 98.1 F (36.7 C) 97.5 F (36.4 C) 97.3 F (36.3 C)   TempSrc:  Oral Oral   Resp: 18 20 20    Height:      Weight:   64.365 kg (141 lb 14.4 oz)   SpO2: 98% 96% 96%     Intake/Output Summary (Last 24 hours) at 11/11/13 1623 Last data filed at 11/11/13 1501  Gross per 24 hour  Intake    480 ml  Output   1700 ml  Net  -1220 ml     Filed Weights   11/09/13 0354 11/10/13 0409 11/11/13 0433  Weight: 65.363 kg (144 lb 1.6 oz) 64.411 kg (142 lb) 64.365 kg (141 lb 14.4 oz)   Exam: General: No acute respiratory distress Lungs: Clear to auscultation bilaterally without wheezes or crackles Cardiovascular: Regular rate at time of exam with irreg rythmn  Abdomen: Nontender, nondistended, soft, bowel sounds positive, no rebound, no ascites, no appreciable mass Extremities: No significant cyanosis, clubbing, or edema bilateral lower extremities   Data Reviewed: Labs reviewed in detail  Scheduled Meds: . acidophilus  1 capsule Oral Daily  . allopurinol  50 mg Oral BID  . antiseptic oral rinse  15 mL Mouth Rinse BID  . calcium carbonate  1 tablet Oral BID WC  . cholecalciferol  1,000 Units Oral Daily  . fenofibrate  160 mg Oral Daily  .  insulin aspart  0-15 Units Subcutaneous TID WC  . insulin aspart  0-5 Units Subcutaneous QHS  . levothyroxine  112 mcg Oral QAC breakfast  . metoprolol  100 mg Oral BID  . omega-3 acid ethyl esters  1 capsule Oral BID  . pantoprazole  40 mg Oral Daily  . propafenone  225 mg Oral 3 times per day  . rivaroxaban  15 mg Oral Q supper  . sodium  chloride  3 mL Intravenous Q12H   Time spent 35 minutes

## 2013-11-12 ENCOUNTER — Inpatient Hospital Stay (HOSPITAL_COMMUNITY): Payer: Medicare Other

## 2013-11-12 ENCOUNTER — Other Ambulatory Visit: Payer: Self-pay | Admitting: Cardiovascular Disease

## 2013-11-12 ENCOUNTER — Ambulatory Visit: Payer: Medicare Other | Admitting: Cardiology

## 2013-11-12 DIAGNOSIS — I999 Unspecified disorder of circulatory system: Secondary | ICD-10-CM

## 2013-11-12 DIAGNOSIS — I4891 Unspecified atrial fibrillation: Secondary | ICD-10-CM

## 2013-11-12 DIAGNOSIS — Z79899 Other long term (current) drug therapy: Secondary | ICD-10-CM

## 2013-11-12 DIAGNOSIS — R0602 Shortness of breath: Secondary | ICD-10-CM

## 2013-11-12 DIAGNOSIS — I5033 Acute on chronic diastolic (congestive) heart failure: Secondary | ICD-10-CM

## 2013-11-12 LAB — BASIC METABOLIC PANEL
BUN: 39 mg/dL — ABNORMAL HIGH (ref 6–23)
CHLORIDE: 96 meq/L (ref 96–112)
CO2: 27 mEq/L (ref 19–32)
Calcium: 9.6 mg/dL (ref 8.4–10.5)
Creatinine, Ser: 1.75 mg/dL — ABNORMAL HIGH (ref 0.50–1.10)
GFR calc non Af Amer: 31 mL/min — ABNORMAL LOW (ref >90)
GFR, EST AFRICAN AMERICAN: 36 mL/min — AB (ref >90)
Glucose, Bld: 112 mg/dL — ABNORMAL HIGH (ref 70–99)
Potassium: 4.4 mEq/L (ref 3.7–5.3)
Sodium: 138 mEq/L (ref 137–147)

## 2013-11-12 LAB — CBC
HCT: 42 % (ref 36.0–46.0)
HEMOGLOBIN: 13.2 g/dL (ref 12.0–15.0)
MCH: 27.9 pg (ref 26.0–34.0)
MCHC: 31.4 g/dL (ref 30.0–36.0)
MCV: 88.8 fL (ref 78.0–100.0)
PLATELETS: 293 10*3/uL (ref 150–400)
RBC: 4.73 MIL/uL (ref 3.87–5.11)
RDW: 14.9 % (ref 11.5–15.5)
WBC: 9.2 10*3/uL (ref 4.0–10.5)

## 2013-11-12 LAB — GLUCOSE, CAPILLARY
GLUCOSE-CAPILLARY: 91 mg/dL (ref 70–99)
Glucose-Capillary: 195 mg/dL — ABNORMAL HIGH (ref 70–99)
Glucose-Capillary: 147 mg/dL — ABNORMAL HIGH (ref 70–99)
Glucose-Capillary: 95 mg/dL (ref 70–99)

## 2013-11-12 LAB — MAGNESIUM: MAGNESIUM: 1.9 mg/dL (ref 1.5–2.5)

## 2013-11-12 MED ORDER — AMIODARONE HCL 200 MG PO TABS
400.0000 mg | ORAL_TABLET | Freq: Two times a day (BID) | ORAL | Status: DC
  Administered 2013-11-12 – 2013-11-13 (×3): 400 mg via ORAL
  Filled 2013-11-12 (×3): qty 2

## 2013-11-12 NOTE — Progress Notes (Unsigned)
Clinical Summary [name] is a 58 y.o.female  1. Parox afib - symptoms not previously controlled on rate control strategy. She was referred to EP Dr [name]. Tried initially on flecanide with worsening of symptoms, changed over to propafenone with improved but still ongoing symptoms - recent admit earlier this month with afib with RVR rates 130s, she endorsed medication compliance. - during admit propafenone was increased to 225mg  tid, metoprolol increased to 100mg  bid.  - she continues on xarelto   2. Mildly abnormal Lexiscan   3. HTN   4. Chronic diastolic heart failure - evidence of volume overload during recent admission, likely due to afib with uncontrolled rates - diuresed, Cr trended up to 1.85 but was trending down at discharge, baseline 1.5-1.7.    5. Hyperkalemia - noted during recent admission, admit K 5.9 - was on both ARB and KCl at home Past Medical History  Diagnosis Date  . Microscopic colitis 9/11    Colonoscopy  . Hemorrhoids   . Diverticulosis   . Schatzki's ring     Last EGD with esophageal dilatation 61F  9/11  . Congenital heart disease   . Paroxysmal SVT (supraventricular tachycardia)   . IDA (iron deficiency anemia)   . GERD (gastroesophageal reflux disease)   . Hypothyroidism   . Hypertension   . Hyperlipidemia   . Renal insufficiency   . Gout   . PVD (peripheral vascular disease) 09/2004    Right common femoral endarterectomy in April of 2007  . Hyperparathyroidism   . Scoliosis   . Chronic bronchitis   . Varicose veins right leg pain and swelling  . Atrial fibrillation Jan. 2015  . Fall due to stumbling October 08, 2013    Due to shoes  . Diabetes mellitus without complication     Type II     Allergies  Allergen Reactions  . Hydrocodone-Acetaminophen Nausea Only and Other (See Comments)    Severe headache  . Ibuprofen Other (See Comments)    Kidney disfunction  . Oxycodone Hcl Nausea Only    Headache  . Penicillins  Nausea Only and Other (See Comments)    Severe headache  . Flecainide Nausea Only and Other (See Comments)    Faint feeling     No current facility-administered medications for this visit.   No current outpatient prescriptions on file.   Facility-Administered Medications Ordered in Other Visits  Medication Dose Route Frequency Provider Last Rate Last Dose  . acetaminophen (TYLENOL) tablet 500 mg  500 mg Oral Q4H PRN Tamara Falconer, MD   500 mg at 11/12/13 0418  . acidophilus (RISAQUAD) capsule 1 capsule  1 capsule Oral Daily Tamara Cota, MD   1 capsule at 11/11/13 1014  . allopurinol (ZYLOPRIM) tablet 50 mg  50 mg Oral BID Tamara Falconer, MD   50 mg at 11/11/13 2153  . amiodarone (PACERONE) tablet 400 mg  400 mg Oral BID Tamara Commons, MD      . antiseptic oral rinse (BIOTENE) solution 15 mL  15 mL Mouth Rinse BID Tamara Cota, MD   15 mL at 11/11/13 2000  . calcium carbonate (OS-CAL - dosed in mg of elemental calcium) tablet 500 mg of elemental calcium  1 tablet Oral BID WC Tamara Cota, MD   500 mg of elemental calcium at 11/12/13 0857  . cholecalciferol (VITAMIN D) tablet 1,000 Units  1,000 Units Oral Daily Tamara Falconer, MD   1,000 Units at 11/11/13 1015  .  fenofibrate tablet 160 mg  160 mg Oral Daily Tamara Falconer, MD   160 mg at 11/11/13 1014  . insulin aspart (novoLOG) injection 0-15 Units  0-15 Units Subcutaneous TID WC Tamara Falconer, MD   2 Units at 11/11/13 1738  . insulin aspart (novoLOG) injection 0-5 Units  0-5 Units Subcutaneous QHS Tamara Falconer, MD      . levothyroxine (SYNTHROID, LEVOTHROID) tablet 112 mcg  112 mcg Oral QAC breakfast Tamara Falconer, MD   112 mcg at 11/12/13 0857  . meclizine (ANTIVERT) tablet 12.5 mg  12.5 mg Oral TID PRN Tamara Cota, MD   12.5 mg at 11/11/13 1936  . metoprolol (LOPRESSOR) tablet 100 mg  100 mg Oral BID Tamara Lenis, MD   100 mg at 11/11/13 2154  . omega-3 acid ethyl esters (LOVAZA) capsule 1 g  1 capsule Oral BID Tamara Falconer, MD   1 g at 11/11/13  2153  . ondansetron (ZOFRAN) tablet 4 mg  4 mg Oral Q6H PRN Tamara Falconer, MD   4 mg at 11/09/13 1219   Or  . ondansetron [facility]) injection 4 mg  4 mg Intravenous Q6H PRN Tamara Falconer, MD      . pantoprazole (PROTONIX) EC tablet 40 mg  40 mg Oral Daily Tamara Falconer, MD   40 mg at 11/11/13 1014  . Rivaroxaban (XARELTO) tablet 15 mg  15 mg Oral Q supper Tamara Lenis, MD   15 mg at 11/11/13 1738  . sodium chloride 0.9 % injection 3 mL  3 mL Intravenous Q12H Tamara Falconer, MD   3 mL at 11/11/13 2154  . zolpidem (AMBIEN) tablet 10 mg  10 mg Oral QHS PRN Tamara Falconer, MD   10 mg at 11/10/13 2237     Past Surgical History  Procedure Laterality Date  . Asd repair  [facility]  . Right common femoral endarterectomy  2007  . Left parathyroidectomy  2007  . Parathyroid adenoma  2007  . Left hemithyroidectomy    . Tonsillectomy    . Hemorrhoid surgery  2010  . Colonoscopy  03/04/2010    anal canal hemorrhoids otherwise normal. TI normal. Bx showed lymphocytic colitis  . Esophagogastroduodenoscopy  03/04/2010    noncritical appearing Schatzki ring. SB bx negative  . Elas  06-01-11    Right saphenous ELAS      Allergies  Allergen Reactions  . Hydrocodone-Acetaminophen Nausea Only and Other (See Comments)    Severe headache  . Ibuprofen Other (See Comments)    Kidney disfunction  . Oxycodone Hcl Nausea Only    Headache  . Penicillins Nausea Only and Other (See Comments)    Severe headache  . Flecainide Nausea Only and Other (See Comments)    Faint feeling      Family History  Problem Relation Age of Onset  . Stroke Mother   . Heart disease Mother     CHF  - Amputation-Right Leg  . Hyperlipidemia Mother   . Hypertension Mother   . Lung cancer Father   . Cancer Father     Lung     Social History [name] reports that she has never smoked. She has never used smokeless tobacco. [name] reports that she does not drink alcohol.   Review of  Systems CONSTITUTIONAL: No weight loss, fever, chills, weakness or fatigue.  HEENT: Eyes: No visual loss, blurred vision, double vision or yellow sclerae.No hearing loss, sneezing, congestion, runny  nose or sore throat.  SKIN: No rash or itching.  CARDIOVASCULAR:  RESPIRATORY: No shortness of breath, cough or sputum.  GASTROINTESTINAL: No anorexia, nausea, vomiting or diarrhea. No abdominal pain or blood.  GENITOURINARY: No burning on urination, no polyuria NEUROLOGICAL: No headache, dizziness, syncope, paralysis, ataxia, numbness or tingling in the extremities. No change in bowel or bladder control.  MUSCULOSKELETAL: No muscle, back pain, joint pain or stiffness.  LYMPHATICS: No enlarged nodes. No history of splenectomy.  PSYCHIATRIC: No history of depression or anxiety.  ENDOCRINOLOGIC: No reports of sweating, cold or heat intolerance. No polyuria or polydipsia.  Marland Kitchen   Physical Examination There were no vitals filed for this visit. There were no vitals filed for this visit.  Gen: resting comfortably, no acute distress HEENT: no scleral icterus, pupils equal round and reactive, no palptable cervical adenopathy,  CV Resp: Clear to auscultation bilaterally GI: abdomen is soft, non-tender, non-distended, normal bowel sounds, no hepatosplenomegaly MSK: extremities are warm, no edema.  Skin: warm, no rash Neuro:  no focal deficits Psych: appropriate affect   Diagnostic Studies     Assessment and Plan        [name], M.D., F.A.C.C.

## 2013-11-12 NOTE — Progress Notes (Signed)
SUBJECTIVE: Has not gotten out of bed. Has been taking Xarelto for over a month. HR in 90-100 but quickly elevates to 120-140 bpm. Complains of shortness of breath today, which she hasn't experienced since last week. Lasix had been held due to worsening renal function. Also had some dizziness earlier.      Intake/Output Summary (Last 24 hours) at 11/12/13 1159 Last data filed at 11/12/13 [id number]  Gross per 24 hour  Intake    240 ml  Output   1300 ml  Net  -1060 ml    Current Facility-Administered Medications  Medication Dose Route Frequency Provider Last Rate Last Dose  . acetaminophen (TYLENOL) tablet 500 mg  500 mg Oral Q4H PRN Orvan Falconer, MD   500 mg at 11/12/13 0418  . acidophilus (RISAQUAD) capsule 1 capsule  1 capsule Oral Daily Samuella Cota, MD   1 capsule at 11/11/13 1014  . allopurinol (ZYLOPRIM) tablet 50 mg  50 mg Oral BID Orvan Falconer, MD   50 mg at 11/11/13 2153  . amiodarone (PACERONE) tablet 400 mg  400 mg Oral BID Herminio Commons, MD      . antiseptic oral rinse (BIOTENE) solution 15 mL  15 mL Mouth Rinse BID Samuella Cota, MD   15 mL at 11/11/13 2000  . calcium carbonate (OS-CAL - dosed in mg of elemental calcium) tablet 500 mg of elemental calcium  1 tablet Oral BID WC Samuella Cota, MD   500 mg of elemental calcium at 11/12/13 0857  . cholecalciferol (VITAMIN D) tablet 1,000 Units  1,000 Units Oral Daily Orvan Falconer, MD   1,000 Units at 11/11/13 1015  . fenofibrate tablet 160 mg  160 mg Oral Daily Orvan Falconer, MD   160 mg at 11/11/13 1014  . insulin aspart (novoLOG) injection 0-15 Units  0-15 Units Subcutaneous TID WC Orvan Falconer, MD   2 Units at 11/11/13 1738  . insulin aspart (novoLOG) injection 0-5 Units  0-5 Units Subcutaneous QHS Orvan Falconer, MD      . levothyroxine (SYNTHROID, LEVOTHROID) tablet 112 mcg  112 mcg Oral QAC breakfast Orvan Falconer, MD   112 mcg at 11/12/13 0857  . meclizine (ANTIVERT) tablet 12.5 mg  12.5 mg Oral TID PRN Samuella Cota, MD   12.5  mg at 11/11/13 1936  . metoprolol (LOPRESSOR) tablet 100 mg  100 mg Oral BID [name], MD   100 mg at 11/11/13 2154  . omega-3 acid ethyl esters (LOVAZA) capsule 1 g  1 capsule Oral BID Orvan Falconer, MD   1 g at 11/11/13 2153  . ondansetron (ZOFRAN) tablet 4 mg  4 mg Oral Q6H PRN Orvan Falconer, MD   4 mg at 11/09/13 1219   Or  . ondansetron [facility]) injection 4 mg  4 mg Intravenous Q6H PRN Orvan Falconer, MD      . pantoprazole (PROTONIX) EC tablet 40 mg  40 mg Oral Daily Orvan Falconer, MD   40 mg at 11/11/13 1014  . Rivaroxaban (XARELTO) tablet 15 mg  15 mg Oral Q supper [name], MD   15 mg at 11/11/13 1738  . sodium chloride 0.9 % injection 3 mL  3 mL Intravenous Q12H Orvan Falconer, MD   3 mL at 11/11/13 2154  . zolpidem (AMBIEN) tablet 10 mg  10 mg Oral QHS PRN Orvan Falconer, MD   10 mg at 11/10/13 2237    Filed Vitals:   11/12/13 0413  11/12/13 0718 11/12/13 0719 11/12/13 0722  BP: 107/68 110/66 106/70 94/67  Pulse: 92 90 92 108  Temp: 98.2 F (36.8 C)     TempSrc: Oral     Resp: 18 18 20 20   Height:      Weight: 141 lb 14.4 oz (64.365 kg)     SpO2: 100% 95% 96% 98%    PHYSICAL EXAM General: NAD Neck: No JVD, no thyromegaly.  Lungs: Bibasilar rales. CV: Nondisplaced PMI.  Irregular rhythm, HR 90-100 bpm, normal S1/S2, no S3, no murmur.  No pretibial edema.  Abdomen: Soft, nontender, no hepatosplenomegaly, no distention.  Neurologic: Alert and oriented x 3.  Psych: Normal affect. Extremities: No clubbing or cyanosis.   TELEMETRY: Reviewed telemetry pt in atrial fib (90-120).  LABS: Basic Metabolic Panel:  Recent Labs  11/11/13 0554 11/12/13 0604  NA 138 138  K 4.0 4.4  CL 93* 96  CO2 35* 27  GLUCOSE 108* 112*  BUN 39* 39*  CREATININE 1.85* 1.75*  CALCIUM 10.2 9.6  MG  --  1.9   Liver Function Tests: No results found for this basename: AST, ALT, ALKPHOS, BILITOT, PROT, ALBUMIN,  in the last 72 hours No results found for this basename: LIPASE, AMYLASE,  in the last 72  hours CBC:  Recent Labs  11/12/13 0604  WBC 9.2  HGB 13.2  HCT 42.0  MCV 88.8  PLT 293   Cardiac Enzymes: No results found for this basename: CKTOTAL, CKMB, CKMBINDEX, TROPONINI,  in the last 72 hours BNP: No components found with this basename: POCBNP,  D-Dimer: No results found for this basename: DDIMER,  in the last 72 hours Hemoglobin A1C: No results found for this basename: HGBA1C,  in the last 72 hours Fasting Lipid Panel: No results found for this basename: CHOL, HDL, LDLCALC, TRIG, CHOLHDL, LDLDIRECT,  in the last 72 hours Thyroid Function Tests: No results found for this basename: TSH, T4TOTAL, FREET3, T3FREE, THYROIDAB,  in the last 72 hours Anemia Panel: No results found for this basename: VITAMINB12, FOLATE, FERRITIN, TIBC, IRON, RETICCTPCT,  in the last 72 hours  RADIOLOGY: Dg Chest 2 View  11/06/2013   CLINICAL DATA:  Shortness of breath and chest discomfort tonight. History congenital heart disease.  EXAM: CHEST  2 VIEW  COMPARISON:  DG CHEST 2 VIEW dated 09/06/2011  FINDINGS: Cardiac enlargement with mild central and basilar interstitial changes possibly representing edema. No focal airspace disease or consolidation. No blunting of costophrenic angles. No pneumothorax. Thoracolumbar scoliosis. Surgical clips in the base of the neck. No significant change since prior study.  IMPRESSION: Cardiac enlargement with probable interstitial edema. No focal consolidation or airspace disease. No change since prior study.   Electronically Signed   By: Lucienne Capers M.D.   On: 11/06/2013 22:26      ASSESSMENT AND PLAN: 1. Atrial fibrillation: Continues to have uncontrolled atrial fibrillation rates with minimal activity. BP remains soft, so unable to increase metoprolol dose. Propafenone increase to 225 mg tid has largely been ineffective. I spoke with Dr. Lovena Le (EP), whom she saw previously in consultation, as I am considering amiodarone and cardioversion. He agrees to this,  as her poor renal function makes prohibits the use of other antiarrhythmics (dofetilide, sotalol). Given her age, long term use of amiodarone would not be the best option. I will d/c propafenone and start amiodarone 400 mg bid x 2 weeks, followed by 400 mg daily x 2 weeks, then 200 mg daily. I will then have her  scheduled to see Dr. Rayann Heman for further considerations (perhaps AV node ablation + pacemaker vs atrial fib ablation). Continue metoprolol 100 mg bid. Continue renal dosing of Xarelto.  2. Diastolic heart failure: More SOB today. Has bibasilar rales. Will obtain chest xray. Had been dry and Lasix was held for this reason. No pedal edema.   3. CKD: BUN/Cr slightly improved today. Waiting on chest xray. May need to resum Lasix as she is short of breath with bibasilar rales.    Kate Sable, M.D., F.A.C.C.

## 2013-11-12 NOTE — Progress Notes (Signed)
PROGRESS NOTE  [name] [id number] DOB: 10-16-1955 DOA: 11/06/2013 PCP: Delphina Cahill, MD  Summary: 58 year old woman with history of paroxysmal atrial fibrillation maintained on Rhythmol and Xarelto who presented with acute on chronic shortness of breath, palpitations, and fast heart rate. Initial evaluation revealed hyperkalemia, atrial flutter with variable response and heart failure. Admitted for heart failure, hyperkalemia, rapid heart rate. Heart failure quickly resolved as did hyperkalemia. Heart rate has been difficult to control; Cardiology assisting with management. Anticipate discharge once heart rate controlled.  Assessment/Plan: 1. Atrial fibrillation with rapid ventricular response.  Borderline control without improvement. Cardiology assisting with management 2. Acute heart failure, presumed diastolic. Appears slightly dry. Holding lasix.CXR ordered- will follow 3. Hyperkalemia. Resolved. Cozaar and potassium supplementation discontinued due to climbing crt 4. History of deep venous reflux/venous hypertension. Followed by vascular surgery, recently seen 09/2013, no evidence of lower extremity arterial insufficiency. 5. Chronic kidney disease stage III - baseline crt ~1.8 as of March 2015, but as low as 1.2 earlier in the year - agree w/ holding lasix - recheck crt in AM - may require gently fluid if not improved  6. DM type 2. CBG stable - no change in tx plan today  7. Obesity - Body mass index is 29.15 kg/(m^2).  Code Status: FULL DVT prophylaxis: Xarelto Family Communication: Dr Dorothy Puffer spoke w/ pt and brother at bedside  Disposition Plan: Home when improved  Debbe Odea, MD [facility] For Consults/Admissions - Flow Manager - [phone] Office  [phone] Pager [phone]  On-Call/Text Page:      Shea [email or web address]      password [facility]  11/12/2013, 12:58 PM  LOS: 6 days    Consultants:  Cardiology  Procedures:  none  Antibiotics:  none  HPI/Subjective: No palpitations or chest pain.   Objective: Filed Vitals:   11/12/13 0413 11/12/13 0718 11/12/13 0719 11/12/13 0722  BP: 107/68 110/66 106/70 94/67  Pulse: 92 90 92 108  Temp: 98.2 F (36.8 C)     TempSrc: Oral     Resp: 18 18 20 20   Height:      Weight: 64.365 kg (141 lb 14.4 oz)     SpO2: 100% 95% 96% 98%    Intake/Output Summary (Last 24 hours) at 11/12/13 1258 Last data filed at 11/12/13 [id number]  Gross per 24 hour  Intake    120 ml  Output   1300 ml  Net  -1180 ml     Filed Weights   11/10/13 0409 11/11/13 0433 11/12/13 0413  Weight: 64.411 kg (142 lb) 64.365 kg (141 lb 14.4 oz) 64.365 kg (141 lb 14.4 oz)   Exam: General: No acute respiratory distress Lungs: Clear to auscultation bilaterally without wheezes or crackles Cardiovascular: Regular rate at time of exam with irreg rythmn  Abdomen: Nontender, nondistended, soft, bowel sounds positive, no rebound, no ascites, no appreciable mass Extremities: No significant cyanosis, clubbing, or edema bilateral lower extremities   Data Reviewed: Labs reviewed in detail  Scheduled Meds: . acidophilus  1 capsule Oral Daily  . allopurinol  50 mg Oral BID  . amiodarone  400 mg Oral BID  . antiseptic oral rinse  15 mL Mouth Rinse BID  . calcium carbonate  1 tablet Oral BID WC  . cholecalciferol  1,000 Units Oral Daily  . fenofibrate  160 mg Oral Daily  . insulin aspart  0-15 Units Subcutaneous TID WC  . insulin aspart  0-5 Units Subcutaneous QHS  . levothyroxine  112 mcg Oral QAC breakfast  .  metoprolol  100 mg Oral BID  . omega-3 acid ethyl esters  1 capsule Oral BID  . pantoprazole  40 mg Oral Daily  . rivaroxaban  15 mg Oral Q supper  . sodium chloride  3 mL Intravenous Q12H   Time spent 35 minutes

## 2013-11-13 ENCOUNTER — Encounter (HOSPITAL_COMMUNITY): Payer: Self-pay | Admitting: *Deleted

## 2013-11-13 ENCOUNTER — Encounter (HOSPITAL_COMMUNITY)
Admission: EM | Disposition: A | Discharge status: Home / Self Care | Payer: Medicare Other | Source: Home / Self Care | Attending: Family Medicine

## 2013-11-13 ENCOUNTER — Encounter (HOSPITAL_COMMUNITY): Payer: Medicare Other | Admitting: Anesthesiology

## 2013-11-13 ENCOUNTER — Inpatient Hospital Stay (HOSPITAL_COMMUNITY): Payer: Medicare Other | Admitting: Anesthesiology

## 2013-11-13 HISTORY — PX: CARDIOVERSION: SHX1299

## 2013-11-13 LAB — GLUCOSE, CAPILLARY
GLUCOSE-CAPILLARY: 119 mg/dL — AB (ref 70–99)
GLUCOSE-CAPILLARY: 201 mg/dL — AB (ref 70–99)
Glucose-Capillary: 111 mg/dL — ABNORMAL HIGH (ref 70–99)
Glucose-Capillary: 127 mg/dL — ABNORMAL HIGH (ref 70–99)
Glucose-Capillary: 92 mg/dL (ref 70–99)

## 2013-11-13 SURGERY — CARDIOVERSION
Anesthesia: Monitor Anesthesia Care

## 2013-11-13 MED ORDER — GLYCOPYRROLATE 0.2 MG/ML IJ SOLN
0.2000 mg | Freq: Once | INTRAMUSCULAR | Status: AC
  Administered 2013-11-13: 0.2 mg via INTRAVENOUS

## 2013-11-13 MED ORDER — AMIODARONE HCL IN DEXTROSE 360-4.14 MG/200ML-% IV SOLN
30.0000 mg/h | INTRAVENOUS | Status: DC
  Administered 2013-11-13 – 2013-11-14 (×2): 30 mg/h via INTRAVENOUS
  Filled 2013-11-13 (×3): qty 200

## 2013-11-13 MED ORDER — LACTATED RINGERS IV SOLN
INTRAVENOUS | Status: DC | PRN
  Administered 2013-11-13: 07:00:00 via INTRAVENOUS

## 2013-11-13 MED ORDER — MIDAZOLAM HCL 2 MG/2ML IJ SOLN
INTRAMUSCULAR | Status: AC
  Filled 2013-11-13: qty 2

## 2013-11-13 MED ORDER — AMIODARONE LOAD VIA INFUSION: 360.0000 mg | Freq: Once | INTRAVENOUS | Status: DC

## 2013-11-13 MED ORDER — AMIODARONE IV BOLUS ONLY 150 MG/100ML
150.0000 mg | Freq: Once | INTRAVENOUS | Status: AC
  Administered 2013-11-13: 150 mg via INTRAVENOUS
  Filled 2013-11-13: qty 100

## 2013-11-13 MED ORDER — PROPOFOL 10 MG/ML IV EMUL
INTRAVENOUS | Status: AC
  Filled 2013-11-13: qty 20

## 2013-11-13 MED ORDER — FENTANYL CITRATE 0.05 MG/ML IJ SOLN
INTRAMUSCULAR | Status: AC
  Filled 2013-11-13: qty 2

## 2013-11-13 MED ORDER — GLYCOPYRROLATE 0.2 MG/ML IJ SOLN
INTRAMUSCULAR | Status: AC
  Filled 2013-11-13: qty 1

## 2013-11-13 MED ORDER — FENTANYL CITRATE 0.05 MG/ML IJ SOLN: 25.0000 ug | INTRAMUSCULAR | Status: DC | PRN

## 2013-11-13 MED ORDER — AMIODARONE HCL IN DEXTROSE 360-4.14 MG/200ML-% IV SOLN
60.0000 mg/h | INTRAVENOUS | Status: AC
  Administered 2013-11-13 (×2): 60 mg/h via INTRAVENOUS
  Filled 2013-11-13 (×2): qty 200

## 2013-11-13 MED ORDER — FENTANYL CITRATE 0.05 MG/ML IJ SOLN
INTRAMUSCULAR | Status: DC | PRN
  Administered 2013-11-13: 25 ug via INTRAVENOUS

## 2013-11-13 MED ORDER — ONDANSETRON HCL 4 MG/2ML IJ SOLN
4.0000 mg | Freq: Once | INTRAMUSCULAR | Status: AC
  Administered 2013-11-13: 4 mg via INTRAVENOUS

## 2013-11-13 MED ORDER — METOPROLOL TARTRATE 50 MG PO TABS
50.0000 mg | ORAL_TABLET | Freq: Two times a day (BID) | ORAL | Status: DC
  Administered 2013-11-13 – 2013-11-15 (×4): 50 mg via ORAL
  Filled 2013-11-13 (×5): qty 1

## 2013-11-13 MED ORDER — ONDANSETRON HCL 4 MG/2ML IJ SOLN: 4.0000 mg | Freq: Once | INTRAMUSCULAR | Status: DC | PRN

## 2013-11-13 MED ORDER — PROPOFOL INFUSION 10 MG/ML OPTIME
INTRAVENOUS | Status: DC | PRN
  Administered 2013-11-13: 75 ug/kg/min via INTRAVENOUS

## 2013-11-13 MED ORDER — ADENOSINE 6 MG/2ML IV SOLN
6.0000 mg | Freq: Once | INTRAVENOUS | Status: AC
  Administered 2013-11-13: 6 mg via INTRAVENOUS
  Filled 2013-11-13: qty 2

## 2013-11-13 MED ORDER — ONDANSETRON HCL 4 MG/2ML IJ SOLN
INTRAMUSCULAR | Status: AC
  Filled 2013-11-13: qty 2

## 2013-11-13 MED ORDER — ADENOSINE 12 MG/4ML IV SOLN
12.0000 mg | Freq: Once | INTRAVENOUS | Status: AC
  Administered 2013-11-13: 12 mg via INTRAVENOUS

## 2013-11-13 MED ORDER — LACTATED RINGERS IV SOLN
INTRAVENOUS | Status: DC
  Administered 2013-11-13: 08:00:00 via INTRAVENOUS

## 2013-11-13 MED ORDER — MIDAZOLAM HCL 2 MG/2ML IJ SOLN
1.0000 mg | INTRAMUSCULAR | Status: DC | PRN
  Administered 2013-11-13: 1 mg via INTRAVENOUS
  Administered 2013-11-13: 2 mg via INTRAVENOUS

## 2013-11-13 MED ORDER — LIDOCAINE HCL (CARDIAC) 10 MG/ML IV SOLN
INTRAVENOUS | Status: DC | PRN
  Administered 2013-11-13: 50 mg via INTRAVENOUS

## 2013-11-13 MED ORDER — FENTANYL CITRATE 0.05 MG/ML IJ SOLN: 25.0000 ug | INTRAMUSCULAR | Status: AC

## 2013-11-13 NOTE — Anesthesia Procedure Notes (Addendum)
Procedure Name: MAC Date/Time: 11/13/2013 9:03 AM Performed by: Andree Elk, AMY A Pre-anesthesia Checklist: Patient identified, Emergency Drugs available, Suction available, Patient being monitored and Timeout performed Oxygen Delivery Method: Simple face mask   Procedure Name: MAC Date/Time: 11/13/2013 10:20 AM Performed by: Andree Elk, AMY A Pre-anesthesia Checklist: Patient identified, Emergency Drugs available, Suction available, Patient being monitored and Timeout performed Patient Re-evaluated:Patient Re-evaluated prior to inductionOxygen Delivery Method: Simple face mask

## 2013-11-13 NOTE — Progress Notes (Signed)
PROGRESS NOTE  [name] [id number] DOB: [birth year] DOA: 11/06/2013 PCP: Delphina Cahill, MD  Summary: 58 year old woman with history of paroxysmal atrial fibrillation maintained on Rhythmol and Xarelto who presented with acute on chronic shortness of breath, palpitations, and fast heart rate. Initial evaluation revealed hyperkalemia, atrial flutter with variable response and heart failure. Admitted for heart failure, hyperkalemia, rapid heart rate. Heart failure quickly resolved as did hyperkalemia. Heart rate has been difficult to control; Cardiology assisting with management. Anticipate discharge once heart rate controlled.  Assessment/Plan: 1. Atrial fibrillation with rapid ventricular response.  S/p a difficult Cardioversion- now on IV Amiodarone- per cardiology, she will likely go back into A-fib/ SVT and should have an ablation- Dr Rayann Heman will be consulted.  2. Acute heart failure, presumed diastolic. Appears slightly dry. Holding lasix.CXR ordered- will follow 3. Hyperkalemia. Resolved. Cozaar and potassium supplementation discontinued due to climbing crt 4. History of deep venous reflux/venous hypertension. Followed by vascular surgery, recently seen 09/2013, no evidence of lower extremity arterial insufficiency. 5. Acute on Chronic kidney disease stage III - baseline crt ~1.8 as of March 2015, but as low as 1.2 earlier in the year - agree w/ holding lasix -possibly will need to resume Lasix in AM 6. DM type 2. CBG stable - no change in tx plan today  7. Obesity - Body mass index is 29.27 kg/(m^2).  Code Status: FULL DVT prophylaxis: Xarelto Family Communication: Dr Dorothy Puffer spoke w/ pt and brother at bedside  Disposition Plan: Home when improved  Debbe Odea, MD [facility] For Consults/Admissions - Flow Manager - [phone] Office  [phone] Pager [phone]  On-Call/Text Page:      [email or web address]      password [facility]  11/13/2013, 1:59 PM  LOS: 7 days    Consultants:  Cardiology  Procedures:  none  Antibiotics:  none  HPI/Subjective: No palpitations or chest pain.   Objective: Filed Vitals:   11/13/13 1109 11/13/13 1145 11/13/13 1200 11/13/13 1215  BP: 124/64     Pulse: 54 50 51 52  Temp:      TempSrc:      Resp: 18 16 19 18   Height:      Weight:      SpO2: 100% 95% 100% 100%    Intake/Output Summary (Last 24 hours) at 11/13/13 1359 Last data filed at 11/13/13 1100  Gross per 24 hour  Intake    973 ml  Output   1575 ml  Net   -602 ml     Filed Weights   11/12/13 0413 11/13/13 0612 11/13/13 0742  Weight: 64.365 kg (141 lb 14.4 oz) 63.776 kg (140 lb 9.6 oz) 63.504 kg (140 lb)   Exam: General: No acute respiratory distress Lungs: Clear to auscultation bilaterally without wheezes or crackles Cardiovascular: Regular rate at time of exam with irreg rythmn  Abdomen: Nontender, nondistended, soft, bowel sounds positive, no rebound, no ascites, no appreciable mass Extremities: No significant cyanosis, clubbing, or edema bilateral lower extremities   Data Reviewed: Labs reviewed in detail  Scheduled Meds: . acidophilus  1 capsule Oral Daily  . allopurinol  50 mg Oral BID  . amiodarone  400 mg Oral BID  . antiseptic oral rinse  15 mL Mouth Rinse BID  . calcium carbonate  1 tablet Oral BID WC  . cholecalciferol  1,000 Units Oral Daily  . fenofibrate  160 mg Oral Daily  . insulin aspart  0-15 Units Subcutaneous TID WC  . insulin aspart  0-5 Units Subcutaneous QHS  .  levothyroxine  112 mcg Oral QAC breakfast  . metoprolol  50 mg Oral BID  . omega-3 acid ethyl esters  1 capsule Oral BID  . pantoprazole  40 mg Oral Daily  . rivaroxaban  15 mg Oral Q supper  . sodium chloride  3 mL Intravenous Q12H   Time spent 35 minutes

## 2013-11-13 NOTE — Addendum Note (Signed)
Addendum created 11/13/13 1054 by Mickel Baas, CRNA   Modules edited: Anesthesia Events

## 2013-11-13 NOTE — Procedures (Signed)
Pt with rapid atrial fibrillation and diastolic heart failure. Propafenone largely ineffective, and increase in metoprolol dose prohibited by low normal BP. Prior to procedure, rhythm was atrial fibrillation with HR 100-110 bpm. Direct current cardioversion then performed with120 J with anesthesia assistance for sedation.  Initially, pt was cardioverted to sinus braydcardia 45-50 bpm, but rhythm quickly transitioned to SVT. Adenosine was requested but not immediately available. The decision was then made to attempt cardioversion with 150 J, which successfully cardioverted patient to sinus bradycardia 50 bpm range.  RECS: Continue amiodarone regimen as documented in my note yesterday. Will reduce metoprolol to 50 mg bid with next dose to be given at 1900 today. Will schedule follow up appt with Dr. Rayann Heman for additional management strategies (ablation).

## 2013-11-13 NOTE — Progress Notes (Signed)
[id number] cardioversion 120 joules changed to SB 0920 SVT 0922 cardioversion 150 joules change to SB

## 2013-11-13 NOTE — Anesthesia Postprocedure Evaluation (Signed)
  Anesthesia Post-op Note  Patient: [name]  Procedure(s) Performed: Procedure(s): CARDIOVERSION (N/A)  Patient Location: PACU  Anesthesia Type:MAC  Level of Consciousness: awake, alert , oriented and patient cooperative  Airway and Oxygen Therapy: Patient Spontanous Breathing and Patient connected to nasal cannula oxygen  Post-op Pain: none  Post-op Assessment: Post-op Vital signs reviewed, Patient's Cardiovascular Status Stable, Respiratory Function Stable, Patent Airway and No signs of Nausea or vomiting  Post-op Vital Signs: Reviewed and stable  Last Vitals:  Filed Vitals:   11/13/13 0933  BP: 89/54  Pulse: 51  Temp:   Resp: 16    Complications: No apparent anesthesia complications

## 2013-11-13 NOTE — Anesthesia Preprocedure Evaluation (Addendum)
Anesthesia Evaluation  Patient identified by MRN, date of birth, ID band Patient awake    Reviewed: Allergy & Precautions, H&P , NPO status , Patient's Chart, lab work & pertinent test results  Airway Mallampati: IV TM Distance: >3 FB Neck ROM: Full  Mouth opening: Limited Mouth Opening  Dental  (+) Poor Dentition, Partial Upper   Pulmonary  breath sounds clear to auscultation        Cardiovascular hypertension, Pt. on medications + Peripheral Vascular Disease and +CHF + dysrhythmias Atrial Fibrillation Rhythm:Regular Rate:Normal     Neuro/Psych    GI/Hepatic GERD-  Controlled and Medicated,  Endo/Other  diabetes, Type 2, Oral Hypoglycemic AgentsHypothyroidism   Renal/GU Renal InsufficiencyRenal disease     Musculoskeletal   Abdominal   Peds  Hematology  (+) anemia ,   Anesthesia Other Findings   Reproductive/Obstetrics                          Anesthesia Physical Anesthesia Plan  ASA: III  Anesthesia Plan: MAC   Post-op Pain Management:    Induction: Intravenous  Airway Management Planned: Simple Face Mask  Additional Equipment:   Intra-op Plan:   Post-operative Plan:   Informed Consent: I have reviewed the patients History and Physical, chart, labs and discussed the procedure including the risks, benefits and alternatives for the proposed anesthesia with the patient or authorized representative who has indicated his/her understanding and acceptance.     Plan Discussed with:   Anesthesia Plan Comments:         Anesthesia Quick Evaluation

## 2013-11-13 NOTE — Addendum Note (Signed)
Addendum created 11/13/13 0945 by Mickel Baas, CRNA   Modules edited: Anesthesia Medication Administration

## 2013-11-13 NOTE — Progress Notes (Signed)
SUBJECTIVE: Prior to procedure, pt was atrial fibrillation with HR 100-110 bpm. 120 J was unsuccessful, but 150 J initially cardioverted her to sinus bradycardia. Shortly thereafter, she went into long RP tachycardia (SVT) for which adenosine at 6 and then 12 mg IV was administered, without effect. She was then given 150 mg IV amiodarone and an infusion was started. Ultimately, she was administered another 120 J which cardioverted her to sinus bradycardia. The patient remained stable.     Intake/Output Summary (Last 24 hours) at 11/13/13 1347 Last data filed at 11/13/13 1100  Gross per 24 hour  Intake    973 ml  Output   1575 ml  Net   -602 ml    Current Facility-Administered Medications  Medication Dose Route Frequency Provider Last Rate Last Dose  . acetaminophen (TYLENOL) tablet 500 mg  500 mg Oral Q4H PRN Orvan Falconer, MD   500 mg at 11/12/13 0418  . acidophilus (RISAQUAD) capsule 1 capsule  1 capsule Oral Daily Samuella Cota, MD   1 capsule at 11/12/13 1531  . allopurinol (ZYLOPRIM) tablet 50 mg  50 mg Oral BID Orvan Falconer, MD   50 mg at 11/12/13 2127  . amiodarone (NEXTERONE PREMIX) 360 MG/200ML (1.8 mg/mL) IV infusion  60 mg/hr Intravenous Continuous Herminio Commons, MD 33.3 mL/hr at 11/13/13 1030 60 mg/hr at 11/13/13 1030  . amiodarone (NEXTERONE PREMIX) 360 MG/200ML (1.8 mg/mL) IV infusion  30 mg/hr Intravenous Continuous Herminio Commons, MD      . amiodarone (PACERONE) tablet 400 mg  400 mg Oral BID Herminio Commons, MD   400 mg at 11/12/13 2127  . antiseptic oral rinse (BIOTENE) solution 15 mL  15 mL Mouth Rinse BID Samuella Cota, MD   15 mL at 11/12/13 2130  . calcium carbonate (OS-CAL - dosed in mg of elemental calcium) tablet 500 mg of elemental calcium  1 tablet Oral BID WC Samuella Cota, MD   500 mg of elemental calcium at 11/12/13 1812  . cholecalciferol (VITAMIN D) tablet 1,000 Units  1,000 Units Oral Daily Orvan Falconer, MD   1,000 Units at 11/12/13  1532  . fenofibrate tablet 160 mg  160 mg Oral Daily Orvan Falconer, MD   160 mg at 11/12/13 1531  . insulin aspart (novoLOG) injection 0-15 Units  0-15 Units Subcutaneous TID WC Orvan Falconer, MD   2 Units at 11/12/13 1813  . insulin aspart (novoLOG) injection 0-5 Units  0-5 Units Subcutaneous QHS Orvan Falconer, MD      . levothyroxine (SYNTHROID, LEVOTHROID) tablet 112 mcg  112 mcg Oral QAC breakfast Orvan Falconer, MD   112 mcg at 11/12/13 0857  . meclizine (ANTIVERT) tablet 12.5 mg  12.5 mg Oral TID PRN Samuella Cota, MD   12.5 mg at 11/11/13 1936  . metoprolol tartrate (LOPRESSOR) tablet 50 mg  50 mg Oral BID Herminio Commons, MD      . omega-3 acid ethyl esters (LOVAZA) capsule 1 g  1 capsule Oral BID Orvan Falconer, MD   1 g at 11/12/13 2127  . ondansetron (ZOFRAN) tablet 4 mg  4 mg Oral Q6H PRN Orvan Falconer, MD   4 mg at 11/09/13 1219   Or  . ondansetron [facility]) injection 4 mg  4 mg Intravenous Q6H PRN Orvan Falconer, MD      . pantoprazole (PROTONIX) EC tablet 40 mg  40 mg Oral Daily Orvan Falconer, MD   40 mg at  11/12/13 1532  . Rivaroxaban (XARELTO) tablet 15 mg  15 mg Oral Q supper [name], MD   15 mg at 11/12/13 1812  . sodium chloride 0.9 % injection 3 mL  3 mL Intravenous Q12H Orvan Falconer, MD   3 mL at 11/12/13 2130  . zolpidem (AMBIEN) tablet 10 mg  10 mg Oral QHS PRN Orvan Falconer, MD   10 mg at 11/10/13 2237    Filed Vitals:   11/13/13 1109 11/13/13 1145 11/13/13 1200 11/13/13 1215  BP: 124/64     Pulse: 54 50 51 52  Temp:      TempSrc:      Resp: 18 16 19 18   Height:      Weight:      SpO2: 100% 95% 100% 100%    PHYSICAL EXAM General: NAD Neck: No JVD, no thyromegaly.  Lungs: Diminished with bibasilar crackles b/l.  CV: Nondisplaced PMI.  Irregular rhythm, tachycardic, normal S1/S2, no S3, no murmur.  No pretibial edema.  No carotid bruit.  Normal pedal pulses.  Abdomen: Soft, nontender, no hepatosplenomegaly, no distention.  Neurologic: Alert and oriented x 3.  Psych: Normal  affect. Extremities: No clubbing or cyanosis.   TELEMETRY: Reviewed telemetry pt in A Fib, 95-110 bpm.  LABS: Basic Metabolic Panel:  Recent Labs  11/11/13 0554 11/12/13 0604  NA 138 138  K 4.0 4.4  CL 93* 96  CO2 35* 27  GLUCOSE 108* 112*  BUN 39* 39*  CREATININE 1.85* 1.75*  CALCIUM 10.2 9.6  MG  --  1.9   Liver Function Tests: No results found for this basename: AST, ALT, ALKPHOS, BILITOT, PROT, ALBUMIN,  in the last 72 hours No results found for this basename: LIPASE, AMYLASE,  in the last 72 hours CBC:  Recent Labs  11/12/13 0604  WBC 9.2  HGB 13.2  HCT 42.0  MCV 88.8  PLT 293   Cardiac Enzymes: No results found for this basename: CKTOTAL, CKMB, CKMBINDEX, TROPONINI,  in the last 72 hours BNP: No components found with this basename: POCBNP,  D-Dimer: No results found for this basename: DDIMER,  in the last 72 hours Hemoglobin A1C: No results found for this basename: HGBA1C,  in the last 72 hours Fasting Lipid Panel: No results found for this basename: CHOL, HDL, LDLCALC, TRIG, CHOLHDL, LDLDIRECT,  in the last 72 hours Thyroid Function Tests: No results found for this basename: TSH, T4TOTAL, FREET3, T3FREE, THYROIDAB,  in the last 72 hours Anemia Panel: No results found for this basename: VITAMINB12, FOLATE, FERRITIN, TIBC, IRON, RETICCTPCT,  in the last 72 hours  RADIOLOGY: Dg Chest 2 View  11/12/2013   CLINICAL DATA:  CHF, history hypertension, diabetes, hyperlipidemia, atrial fibrillation  EXAM: CHEST  2 VIEW  COMPARISON:  11/06/2013  FINDINGS: Enlargement of cardiac silhouette.  Atherosclerotic calcification aorta.  Mediastinal contours and pulmonary vascularity normal for degree of levoconvex thoracic scoliosis.  Lungs clear.  No pleural effusion or pneumothorax.  Surgical clips in the cervical region bilaterally.  No acute osseous findings.  IMPRESSION: Enlargement of cardiac silhouette without gross pulmonary edema.  Scoliosis.  No acute abnormalities.    Electronically Signed   By: Lavonia Dana M.D.   On: 11/12/2013 13:33   Dg Chest 2 View  11/06/2013   CLINICAL DATA:  Shortness of breath and chest discomfort tonight. History congenital heart disease.  EXAM: CHEST  2 VIEW  COMPARISON:  DG CHEST 2 VIEW dated 09/06/2011  FINDINGS: Cardiac enlargement with mild central  and basilar interstitial changes possibly representing edema. No focal airspace disease or consolidation. No blunting of costophrenic angles. No pneumothorax. Thoracolumbar scoliosis. Surgical clips in the base of the neck. No significant change since prior study.  IMPRESSION: Cardiac enlargement with probable interstitial edema. No focal consolidation or airspace disease. No change since prior study.   Electronically Signed   By: Lucienne Capers M.D.   On: 11/06/2013 22:26      ASSESSMENT AND PLAN: 1. Atrial fibrillation/SVT: Continue with IV amiodarone load and continue to administer oral amiodarone along with metoprolol at reduced dose of 50 mg bid, with next dose to be given at 1900 today.  Will schedule follow up appt with Dr. Rayann Heman for additional management strategies (ablation).  2. Acute on chronic diastolic heart failure: CXR showed no pulm edema on 5/20. Would consider resuming low-dose oral Lasix on 5/22.  3. CKD: Stable, continue to monitor.  Time spent: 60 minutes.   Kate Sable, M.D., F.A.C.C.

## 2013-11-13 NOTE — Anesthesia Postprocedure Evaluation (Signed)
  Anesthesia Post-op Note  Patient: [name]  Procedure(s) Performed: Procedure(s): CARDIOVERSION (N/A)  Patient Location: PACU  Anesthesia Type:MAC  Level of Consciousness: awake, alert , oriented and patient cooperative  Airway and Oxygen Therapy: Patient Spontanous Breathing and Patient connected to nasal cannula oxygen  Post-op Pain: none  Post-op Assessment: Post-op Vital signs reviewed and Patient's Cardiovascular Status Stable  Post-op Vital Signs: Reviewed and stable  Last Vitals:  Filed Vitals:   11/13/13 1050  BP: 111/65  Pulse: 51  Temp:   Resp: 27    Complications: No apparent anesthesia complications

## 2013-11-13 NOTE — Transfer of Care (Signed)
Immediate Anesthesia Transfer of Care Note  Patient: [name]  Procedure(s) Performed: Procedure(s): CARDIOVERSION (N/A)  Patient Location: PACU  Anesthesia Type:MAC  Level of Consciousness: awake, alert , oriented and patient cooperative  Airway & Oxygen Therapy: Patient Spontanous Breathing and Patient connected to nasal cannula oxygen  Post-op Assessment: Report given to PACU RN and Post -op Vital signs reviewed and stable  Post vital signs: Reviewed and stable  Complications: No apparent anesthesia complications

## 2013-11-13 NOTE — Progress Notes (Signed)
Converted from SB to SVT 143 Cardioversion repeated 120 joules at 1027 back to SB See MAR for meds and infusions

## 2013-11-13 NOTE — Progress Notes (Signed)
Patient converted back to sinus brady rate 50's. Pt resting in bed watching tv.

## 2013-11-13 NOTE — Addendum Note (Signed)
Addendum created 11/13/13 1053 by Mickel Baas, CRNA   Modules edited: Anesthesia Blocks and Procedures, Anesthesia Device Management, Anesthesia Events, Anesthesia Flowsheet, Anesthesia Medication Administration, Anesthesia Responsible Staff, Clinical Notes, Flowsheet VN, Notes Section   Clinical Notes:  File: [id number]   Flowsheet VN:  [id number]   Notes Section:  File: [id number]; File: [id number]

## 2013-11-13 NOTE — Progress Notes (Signed)
Pt converted from from sinus brady rate 50's to a-fib rate between 110's-120's. Patient is asymptomatic. MD paged to make aware. Will continue to monitor.

## 2013-11-13 NOTE — Progress Notes (Signed)
UR chart review completed.  

## 2013-11-14 ENCOUNTER — Encounter (HOSPITAL_COMMUNITY): Payer: Self-pay | Admitting: Cardiovascular Disease

## 2013-11-14 LAB — GLUCOSE, CAPILLARY
GLUCOSE-CAPILLARY: 90 mg/dL (ref 70–99)
Glucose-Capillary: 110 mg/dL — ABNORMAL HIGH (ref 70–99)
Glucose-Capillary: 168 mg/dL — ABNORMAL HIGH (ref 70–99)
Glucose-Capillary: 95 mg/dL (ref 70–99)

## 2013-11-14 MED ORDER — POLYETHYLENE GLYCOL 3350 17 G PO PACK
17.0000 g | PACK | Freq: Every day | ORAL | Status: DC | PRN
  Administered 2013-11-14 – 2013-11-15 (×2): 17 g via ORAL
  Filled 2013-11-14 (×2): qty 1

## 2013-11-14 MED ORDER — FUROSEMIDE 20 MG PO TABS
20.0000 mg | ORAL_TABLET | Freq: Every day | ORAL | Status: DC
  Administered 2013-11-14 – 2013-11-15 (×2): 20 mg via ORAL
  Filled 2013-11-14 (×2): qty 1

## 2013-11-14 MED ORDER — AMIODARONE HCL 200 MG PO TABS
400.0000 mg | ORAL_TABLET | Freq: Two times a day (BID) | ORAL | Status: DC
  Administered 2013-11-14 – 2013-11-15 (×3): 400 mg via ORAL
  Filled 2013-11-14 (×3): qty 2

## 2013-11-14 NOTE — Progress Notes (Signed)
PROGRESS NOTE  [name] [id number] DOB: 12-22-1955 DOA: 11/06/2013 PCP: Delphina Cahill, MD  Summary: 58 year old woman with history of paroxysmal atrial fibrillation maintained on Rhythmol and Xarelto who presented with acute on chronic shortness of breath, palpitations, and fast heart rate. Initial evaluation revealed hyperkalemia, atrial flutter with variable response and heart failure. Admitted for heart failure, hyperkalemia, rapid heart rate. Heart failure quickly resolved as did hyperkalemia. Heart rate has been difficult to control; Cardiology assisting with management. Anticipate discharge once heart rate controlled.  Assessment/Plan: 1. Atrial fibrillation with rapid ventricular response.  S/p a difficult Cardioversion- now on IV Amiodarone- per cardiology, she will likely go back into A-fib/ SVT and should have an ablation- Dr Rayann Heman will be consulted.  2. Acute heart failure, presumed diastolic. Appears slightly dry. Holding lasix.CXR ordered- no edema noted 3. Hyperkalemia. Resolved. Cozaar and potassium supplementation discontinued due to climbing crt 4. History of deep venous reflux/venous hypertension. Followed by vascular surgery, recently seen 09/2013, no evidence of lower extremity arterial insufficiency. 5. Acute on Chronic kidney disease stage III - baseline crt ~1.8 as of March 2015, but as low as 1.2 earlier in the year - agree w/ holding lasix -possibly will need to resume Lasix in AM 6. DM type 2. CBG stable - no change in tx plan today  7. Obesity - Body mass index is 30.74 kg/(m^2).  Code Status: FULL DVT prophylaxis: Xarelto Family Communication: Dr Dorothy Puffer spoke w/ pt and brother at bedside  Disposition Plan: Home when improved  Debbe Odea, MD [facility] For Consults/Admissions - Flow Manager - [phone] Office  [phone] Pager [phone]  On-Call/Text Page:      [email or web address]      password [email or web address]  11/14/2013, 11:22 AM  LOS: 8 days    Consultants:  Cardiology  Procedures:  none  Antibiotics:  none  HPI/Subjective: No palpitations or chest pain.   Objective: Filed Vitals:   11/14/13 0400 11/14/13 0500 11/14/13 0730 11/14/13 0800  BP: 103/61   116/84  Pulse: 49 51  50  Temp: 97.8 F (36.6 C)  97.7 F (36.5 C)   TempSrc: Oral  Oral   Resp: 17 14  16   Height:      Weight:  66.7 kg (147 lb 0.8 oz)    SpO2: 97% 100%  100%    Intake/Output Summary (Last 24 hours) at 11/14/13 1122 Last data filed at 11/14/13 0700  Gross per 24 hour  Intake 555.13 ml  Output    750 ml  Net -194.87 ml     Filed Weights   11/13/13 0612 11/13/13 0742 11/14/13 0500  Weight: 63.776 kg (140 lb 9.6 oz) 63.504 kg (140 lb) 66.7 kg (147 lb 0.8 oz)   Exam: General: No acute respiratory distress Lungs: Clear to auscultation bilaterally without wheezes or crackles Cardiovascular: Regular rate at time of exam with irreg rythmn  Abdomen: Nontender, nondistended, soft, bowel sounds positive, no rebound, no ascites, no appreciable mass Extremities: No significant cyanosis, clubbing, or edema bilateral lower extremities   Data Reviewed: Labs reviewed in detail  Scheduled Meds: . acidophilus  1 capsule Oral Daily  . allopurinol  50 mg Oral BID  . amiodarone  400 mg Oral BID  . antiseptic oral rinse  15 mL Mouth Rinse BID  . calcium carbonate  1 tablet Oral BID WC  . cholecalciferol  1,000 Units Oral Daily  . fenofibrate  160 mg Oral Daily  . furosemide  20 mg Oral Daily  . insulin  aspart  0-15 Units Subcutaneous TID WC  . insulin aspart  0-5 Units Subcutaneous QHS  . levothyroxine  112 mcg Oral QAC breakfast  . metoprolol  50 mg Oral BID  . omega-3 acid ethyl esters  1 capsule Oral BID  . pantoprazole  40 mg Oral Daily  . rivaroxaban  15 mg Oral Q supper  . sodium chloride  3 mL Intravenous Q12H   Time spent 35 minutes

## 2013-11-14 NOTE — Progress Notes (Signed)
Consulting cardiologist: Kate Sable MD Primary Cardiologist: Carlyle Dolly MD  Subjective:    Still having periods of dizziness especially when getting up out of bed. No chest pain or palpitations.   Objective:   Temp:  [97.7 F (36.5 C)-98.5 F (36.9 C)] 97.7 F (36.5 C) (05/22 0730) Pulse Rate:  [46-145] 51 (05/22 0500) Resp:  [11-32] 14 (05/22 0500) BP: (89-124)/(48-86) 103/61 mmHg (05/22 0400) SpO2:  [93 %-100 %] 100 % (05/22 0500) Weight:  [147 lb 0.8 oz (66.7 kg)] 147 lb 0.8 oz (66.7 kg) (05/22 0500) Last BM Date: 11/11/13  Filed Weights   11/13/13 0612 11/13/13 0742 11/14/13 0500  Weight: 140 lb 9.6 oz (63.776 kg) 140 lb (63.504 kg) 147 lb 0.8 oz (66.7 kg)    Intake/Output Summary (Last 24 hours) at 11/14/13 0839 Last data filed at 11/14/13 0700  Gross per 24 hour  Intake 1285.13 ml  Output    750 ml  Net 535.13 ml    Telemetry: NSR rates in the 50's. Occasional PVC's.   Exam:  General: No acute distress.Complaining of dizziness.   HEENT: Conjunctiva and lids normal, oropharynx clear.  Lungs: Clear to auscultation, nonlabored.  Cardiac: No elevated JVP or bruits. RRR, no gallop or rub.   Abdomen: Normoactive bowel sounds, nontender, nondistended.  Extremities: No pitting 1+ edema, distal pulses full.  Neuropsychiatric: Alert and oriented x3, affect appropriate.   Lab Results:  Basic Metabolic Panel:  Recent Labs Lab 11/10/13 0531 11/11/13 0554 11/12/13 0604  NA 140 138 138  K 4.0 4.0 4.4  CL 96 93* 96  CO2 33* 35* 27  GLUCOSE 103* 108* 112*  BUN 33* 39* 39*  CREATININE 1.71* 1.85* 1.75*  CALCIUM 10.4 10.2 9.6  MG  --   --  1.9    CBC:  Recent Labs Lab 11/12/13 0604  WBC 9.2  HGB 13.2  HCT 42.0  MCV 88.8  PLT 293    Cardiac Enzymes:  Recent Labs Lab 11/07/13 1318 11/07/13 1949  TROPONINI <0.30 <0.30    BNP:  Recent Labs  11/06/13 2126  PROBNP 5085.0*    Coagulation: No results found for this  basename: INR,  in the last 168 hours  Radiology: Dg Chest 2 View  11/12/2013   CLINICAL DATA:  CHF, history hypertension, diabetes, hyperlipidemia, atrial fibrillation  EXAM: CHEST  2 VIEW  COMPARISON:  11/06/2013  FINDINGS: Enlargement of cardiac silhouette.  Atherosclerotic calcification aorta.  Mediastinal contours and pulmonary vascularity normal for degree of levoconvex thoracic scoliosis.  Lungs clear.  No pleural effusion or pneumothorax.  Surgical clips in the cervical region bilaterally.  No acute osseous findings.  IMPRESSION: Enlargement of cardiac silhouette without gross pulmonary edema.  Scoliosis.  No acute abnormalities.   Electronically Signed   By: Lavonia Dana M.D.   On: 11/12/2013 13:33     ECG: NSR-Bradycardia    Medications:   Scheduled Medications: . acidophilus  1 capsule Oral Daily  . allopurinol  50 mg Oral BID  . amiodarone  400 mg Oral BID  . antiseptic oral rinse  15 mL Mouth Rinse BID  . calcium carbonate  1 tablet Oral BID WC  . cholecalciferol  1,000 Units Oral Daily  . fenofibrate  160 mg Oral Daily  . insulin aspart  0-15 Units Subcutaneous TID WC  . insulin aspart  0-5 Units Subcutaneous QHS  . levothyroxine  112 mcg Oral QAC breakfast  . metoprolol  50 mg Oral BID  .  omega-3 acid ethyl esters  1 capsule Oral BID  . pantoprazole  40 mg Oral Daily  . rivaroxaban  15 mg Oral Q supper  . sodium chloride  3 mL Intravenous Q12H    Infusions: . amiodarone 30 mg/hr (11/14/13 0400)    PRN Medications: acetaminophen, meclizine, ondansetron (ZOFRAN) IV, ondansetron, zolpidem   Assessment and Plan:   1. Atrial fibrillation: Long RP tachycardia (SVT). S/P DCCV per Dr. Bronson Ing that was unsuccessful requiring adenosine boluses, and then started on amiodarone after bolus and now infusion of 30 mg/hr. This will stop loading at 12 noon today. Will transition to 400 mg BID. She is to have very close follow up with EP on discharge to discuss and plan  ablation. She continues in SB with some complaints of dizziness with movement or position change. She denies chest pain. Continue Xarelto at 15 mg daily in the setting of CKD..   2. Acute on Chronic Diastolic CHF:  Evidence of mild fluid overload currently. Will start her back on po lasix. Home dose is 20 mg daily. Will begin there. Creatinine 1.75. Follow closely. BP Low normal.   3. CKD: Following lytes. Has risen from 1.54 to 1.75 per labs on the 20th. Repeat labs today.    Phill Myron. Purcell Nails NP [facility] 11/14/2013, 8:39 AM

## 2013-11-14 NOTE — Plan of Care (Signed)
Problem: Phase I Progression Outcomes Goal: Pain controlled with appropriate interventions Outcome: Not Applicable Date Met:  16/96/78 No complaints of pain Goal: EF % per last Echo/documented,Core Reminder form on chart Outcome: Completed/Met Date Met:  11/14/13 04/2013 - EF 60-65%

## 2013-11-14 NOTE — Progress Notes (Signed)
The patient was seen and examined, and I agree with the assessment and plan as documented above, with modifications as noted below. Pt remains in sinus bradycardia on amiodarone infusion. This will end this morning, at which point I will continue amiodarone orally 400 mg bid. She needs consultation with Dr. Rayann Heman (discussed with Dr. Lovena Le, also EP) for ablation. Will try and schedule her an appt for next Tuesday. If she remains stable, she can be discharged over the weekend. I've instructed her that should symptoms recur after discharge and she needs to go to the ED, would recommend [facility] so as to facilitate advanced therapies in EP lab. Resume Lasix 20 mg daily today.

## 2013-11-14 NOTE — Addendum Note (Signed)
Addendum created 11/14/13 1028 by Ollen Bowl, CRNA   Modules edited: Notes Section   Notes Section:  File: [id number]

## 2013-11-14 NOTE — Anesthesia Postprocedure Evaluation (Signed)
  Anesthesia Post-op Note  Patient: [name]  Procedure(s) Performed: Procedure(s): CARDIOVERSION (N/A)  Patient Location: ICU  Anesthesia Type:MAC  Level of Consciousness: awake, alert  and oriented  Airway and Oxygen Therapy: Patient Spontanous Breathing and Patient connected to face mask oxygen  Post-op Pain: none  Post-op Assessment: Post-op Vital signs reviewed, Patient's Cardiovascular Status Stable, Respiratory Function Stable, Patent Airway and No signs of Nausea or vomiting  Post-op Vital Signs: Reviewed and stable  Last Vitals:  Filed Vitals:   11/14/13 0800  BP: 116/84  Pulse: 50  Temp:   Resp: 16    Complications: No apparent anesthesia complications

## 2013-11-14 NOTE — Progress Notes (Signed)
Dr. Bronson Ing has left a lengthy message with Dr. Rayann Heman to make a follow-up appt for her post discharge. Dr. Rayann Heman is taking care of this and will have our office notify the patient of time and date.  He has also spoken with Dr. Lovena Le who agrees that she will need to be seen by EP ASAP. Awaiting confirmation of follow up EP appt.

## 2013-11-15 DIAGNOSIS — I498 Other specified cardiac arrhythmias: Secondary | ICD-10-CM

## 2013-11-15 LAB — GLUCOSE, CAPILLARY
Glucose-Capillary: 107 mg/dL — ABNORMAL HIGH (ref 70–99)
Glucose-Capillary: 180 mg/dL — ABNORMAL HIGH (ref 70–99)

## 2013-11-15 LAB — BASIC METABOLIC PANEL
BUN: 34 mg/dL — AB (ref 6–23)
CO2: 27 mEq/L (ref 19–32)
CREATININE: 1.69 mg/dL — AB (ref 0.50–1.10)
Calcium: 9.5 mg/dL (ref 8.4–10.5)
Chloride: 98 mEq/L (ref 96–112)
GFR calc Af Amer: 38 mL/min — ABNORMAL LOW (ref >90)
GFR, EST NON AFRICAN AMERICAN: 33 mL/min — AB (ref >90)
Glucose, Bld: 115 mg/dL — ABNORMAL HIGH (ref 70–99)
Potassium: 4.5 mEq/L (ref 3.7–5.3)
Sodium: 137 mEq/L (ref 137–147)

## 2013-11-15 MED ORDER — AMIODARONE HCL 400 MG PO TABS: 400.0000 mg | ORAL_TABLET | Freq: Two times a day (BID) | ORAL | Status: DC

## 2013-11-15 NOTE — Progress Notes (Signed)
Patient ambulated in hall approx 222ft. Patient's HR stayed <100 bpm during ambulation. When sitting in room patient is in sinus brady, HR in the 50's and when ambulating patient in rate controlled a-fib rhythm. Patient on room air during ambulation and oxygen sats stayed at 100%. Patient ambulated independently with no assistive devices. Patient stated she felt weaker than her baseline but not too weak to go home.

## 2013-11-15 NOTE — Progress Notes (Signed)
Patient being discharged home. Patient given discharge instructions including follow up appointments (Cardiology will call) and medication reconciliation. Patient and brother verbalize understanding of all discharge instructions. If symptoms worsen patient told to go to [facility] ER. Patient alert, oriented and in stable condition at the time of discharge. Patient's IV's removed and sites are WNL. Patient discharged home with brother.

## 2013-11-15 NOTE — Discharge Summary (Addendum)
Physician Discharge Summary  [name] [id number] DOB: [birth year] DOA: 11/06/2013  PCP: Delphina Cahill, MD  Admit date: 11/06/2013 Discharge date: 11/15/2013  Time spent: >45 minutes  Recommendations for Outpatient Follow-up:  1. F/u with Dr Rayann Heman next week  Discharge Diagnoses:  Principal Problem:   Atrial fibrillation with RVR- difficult to treat Active Problems:   Chronic a-fib   Hyperkalemia   Diastolic dysfunction   DM (diabetes mellitus)   Acute diastolic CHF (congestive heart failure)   CKD (chronic kidney disease), stage III   Discharge Condition: stable  Diet recommendation: heart healthy  Filed Weights   11/13/13 0742 11/14/13 0500 11/15/13 0500  Weight: 63.504 kg (140 lb) 66.7 kg (147 lb 0.8 oz) 67.3 kg (148 lb 5.9 oz)    History of present illness:  58 year old woman with history of paroxysmal atrial fibrillation maintained on Rhythmol and Xarelto who presented with acute on chronic shortness of breath, palpitations, and fast heart rate. Initial evaluation revealed hyperkalemia, atrial flutter with variable response and heart failure. Admitted for heart failure, hyperkalemia, rapid heart rate. Heart failure quickly resolved as did hyperkalemia. Heart rate has been difficult to control; Cardiology assisting with management.   Hospital Course:  1. Atrial fibrillation with rapid ventricular response. Medical management attempted but rate difficult to control. Therefore, underwent cardioversion on 5/21 which was again complicated as it required multiple attempts before she was able to maintain a sinus rhythm- she did convert to A-fib with RVR that same evening but was able to convert back into sinus rhythm after cardioversion she was maintained on IV Amiodarone for 24 hrs and transitioned to high dose oral Amiodarone- per cardiology, she will likely go back into A-fib/ SVT and should have an ablation- Dr Rayann Heman plans on doing an outpt ablation next week. In the  interim, if she is to have any cardiac symptoms, she is asked to go to the [facility] if possible where an ablation can be performed. Currently sinus brady in 32s.  2. Acute heart failure, presumed diastolic. Appears slightly dry. Held lasix.CXR ordered- no edema noted- resume PRN Lasaix as oupt 3. Hyperkalemia. Resolved. Cozaar and potassium supplementation discontinued due to climbing crt- can resume as Cr has stablized 4. History of deep venous reflux/venous hypertension. Followed by vascular surgery, recently seen 09/2013, no evidence of lower extremity arterial insufficiency. 5. Acute on Chronic kidney disease stage III - baseline crt ~1.8 as of March 2015, but as low as 1.2 earlier in the year - resolving with holding Lasix 6. DM type 2. CBG stable - no change in tx plan today  7. Obesity - Body mass index is 30.74 kg/(m^2).     Procedures:  Cardiversion  Consultations:  Cardiology  Discharge Exam: Filed Vitals:   11/15/13 0600 11/15/13 0730 11/15/13 0800 11/15/13 1000  BP: 117/71  124/80 121/85  Pulse: 48  50   Temp:  97.7 F (36.5 C)    TempSrc:  Oral    Resp: 15  15 17   Height:      Weight:      SpO2: 98%  98%   General: No acute respiratory distress  Lungs: Clear to auscultation bilaterally without wheezes or crackles  Cardiovascular: Regular rate at time of exam with irreg rythmn  Abdomen: Nontender, nondistended, soft, bowel sounds positive, no rebound, no ascites, no appreciable mass  Extremities: No significant cyanosis, clubbing, or edema bilateral lower extremities   Discharge Instructions You were cared for by a hospitalist during your hospital stay.  If you have any questions about your discharge medications or the care you received while you were in the hospital after you are discharged, you can call the unit and asked to speak with the hospitalist on call if the hospitalist that took care of you is not available. Once you are discharged, your primary care  physician will handle any further medical issues. Please note that NO REFILLS for any discharge medications will be authorized once you are discharged, as it is imperative that you return to your primary care physician (or establish a relationship with a primary care physician if you do not have one) for your aftercare needs so that they can reassess your need for medications and monitor your lab values.  Discharge Instructions   Diet - low sodium heart healthy    Complete by:  As directed      Discharge instructions    Complete by:  As directed   Go to the [facility] ER if your heart begins to race or you develop shortness of breath or chest pain.     Increase activity slowly    Complete by:  As directed             Medication List         acetaminophen 500 MG tablet  Commonly known as:  TYLENOL  Take 1,000 mg by mouth as needed for mild pain or headache.     ALIGN PO  Take 1 tablet by mouth daily.     allopurinol 100 MG tablet  Commonly known as:  ZYLOPRIM  Take 1 tablet by mouth Twice daily.     amiodarone 400 MG tablet  Commonly known as:  PACERONE  Take 1 tablet (400 mg total) by mouth 2 (two) times daily.     calcium carbonate 600 MG Tabs tablet  Commonly known as:  OS-CAL  Take 600 mg by mouth 2 (two) times daily with a meal.     cholecalciferol 1000 UNITS tablet  Commonly known as:  VITAMIN D  Take 1,000 Units by mouth daily.     COLCRYS 0.6 MG tablet  Generic drug:  colchicine  Take 1 tablet by mouth Twice daily.     fenofibrate 160 MG tablet  Take 1 tablet by mouth daily.     fish oil-omega-3 fatty acids 1000 MG capsule  Take 1 g by mouth 2 (two) times daily.     furosemide 20 MG tablet  Commonly known as:  LASIX  Take 1 tablet (20 mg total) by mouth as needed (For swelling).     glipiZIDE 2.5 MG 24 hr tablet  Commonly known as:  GLUCOTROL XL  Take 2.5 mg by mouth daily.     levothyroxine 112 MCG tablet  Commonly known as:  SYNTHROID, LEVOTHROID   Take 112 mcg by mouth daily before breakfast.     loperamide 2 MG tablet  Commonly known as:  IMODIUM A-D  Take 2 mg by mouth 4 (four) times daily as needed for diarrhea or loose stools.     losartan 25 MG tablet  Commonly known as:  COZAAR  Take 12.5 mg by mouth daily.     Magnesium 250 MG Tabs  Take 1 tablet by mouth daily.     metoprolol 50 MG tablet  Commonly known as:  LOPRESSOR  Take 1.5 tablets (75 mg total) by mouth 2 (two) times daily.     omeprazole 20 MG capsule  Commonly known as:  PRILOSEC  Take 20  mg by mouth daily.     potassium chloride 10 MEQ tablet  Commonly known as:  K-DUR  Take 10 mEq by mouth as needed.     propafenone 150 MG tablet  Commonly known as:  RYTHMOL  Take 1 tablet (150 mg total) by mouth every 8 (eight) hours. As needed for palpitations     rivaroxaban 20 MG Tabs tablet  Commonly known as:  XARELTO  Take 1 tablet (20 mg total) by mouth daily with supper.     sitaGLIPtin 50 MG tablet  Commonly known as:  JANUVIA  Take 50 mg by mouth daily.     zolpidem 10 MG tablet  Commonly known as:  AMBIEN  Take 1 tablet by mouth At bedtime as needed for sleep.       Allergies  Allergen Reactions  . Hydrocodone-Acetaminophen Nausea Only and Other (See Comments)    Severe headache  . Ibuprofen Other (See Comments)    Kidney disfunction  . Oxycodone Hcl Nausea Only    Headache  . Penicillins Nausea Only and Other (See Comments)    Severe headache  . Flecainide Nausea Only and Other (See Comments)    Faint feeling      The results of significant diagnostics from this hospitalization (including imaging, microbiology, ancillary and laboratory) are listed below for reference.    Significant Diagnostic Studies: Dg Chest 2 View  11/12/2013   CLINICAL DATA:  CHF, history hypertension, diabetes, hyperlipidemia, atrial fibrillation  EXAM: CHEST  2 VIEW  COMPARISON:  11/06/2013  FINDINGS: Enlargement of cardiac silhouette.  Atherosclerotic  calcification aorta.  Mediastinal contours and pulmonary vascularity normal for degree of levoconvex thoracic scoliosis.  Lungs clear.  No pleural effusion or pneumothorax.  Surgical clips in the cervical region bilaterally.  No acute osseous findings.  IMPRESSION: Enlargement of cardiac silhouette without gross pulmonary edema.  Scoliosis.  No acute abnormalities.   Electronically Signed   By: Lavonia Dana M.D.   On: 11/12/2013 13:33   Dg Chest 2 View  11/06/2013   CLINICAL DATA:  Shortness of breath and chest discomfort tonight. History congenital heart disease.  EXAM: CHEST  2 VIEW  COMPARISON:  DG CHEST 2 VIEW dated 09/06/2011  FINDINGS: Cardiac enlargement with mild central and basilar interstitial changes possibly representing edema. No focal airspace disease or consolidation. No blunting of costophrenic angles. No pneumothorax. Thoracolumbar scoliosis. Surgical clips in the base of the neck. No significant change since prior study.  IMPRESSION: Cardiac enlargement with probable interstitial edema. No focal consolidation or airspace disease. No change since prior study.   Electronically Signed   By: Lucienne Capers M.D.   On: 11/06/2013 22:26    Microbiology: Recent Results (from the past 240 hour(s))  MRSA PCR SCREENING     Status: None   Collection Time    11/07/13  1:18 AM      Result Value Ref Range Status   MRSA by PCR NEGATIVE  NEGATIVE Final   Comment:            The GeneXpert MRSA Assay (FDA     approved for NASAL specimens     only), is one component of a     comprehensive MRSA colonization     surveillance program. It is not     intended to diagnose MRSA     infection nor to guide or     monitor treatment for     MRSA infections.     Labs: Basic Metabolic Panel:  Recent Labs Lab 11/10/13 0531 11/11/13 0554 11/12/13 0604 11/15/13 0500  NA 140 138 138 137  K 4.0 4.0 4.4 4.5  CL 96 93* 96 98  CO2 33* 35* 27 27  GLUCOSE 103* 108* 112* 115*  BUN 33* 39* 39* 34*   CREATININE 1.71* 1.85* 1.75* 1.69*  CALCIUM 10.4 10.2 9.6 9.5  MG  --   --  1.9  --    Liver Function Tests: No results found for this basename: AST, ALT, ALKPHOS, BILITOT, PROT, ALBUMIN,  in the last 168 hours No results found for this basename: LIPASE, AMYLASE,  in the last 168 hours No results found for this basename: AMMONIA,  in the last 168 hours CBC:  Recent Labs Lab 11/12/13 0604  WBC 9.2  HGB 13.2  HCT 42.0  MCV 88.8  PLT 293   Cardiac Enzymes: No results found for this basename: CKTOTAL, CKMB, CKMBINDEX, TROPONINI,  in the last 168 hours BNP: BNP (last 3 results)  Recent Labs  11/06/13 2126  PROBNP 5085.0*   CBG:  Recent Labs Lab 11/14/13 0732 11/14/13 1141 11/14/13 1616 11/14/13 2123 11/15/13 0722  GLUCAP 110* 168* 90 95 107*       Signed:  Debbe Odea, MD  [facility] 11/15/2013, 10:54 AM

## 2013-11-18 MED FILL — Medication: Qty: 1 | Status: AC

## 2013-11-24 ENCOUNTER — Telehealth: Payer: Self-pay | Admitting: Internal Medicine

## 2013-11-24 ENCOUNTER — Encounter: Payer: Self-pay | Admitting: Internal Medicine

## 2013-11-24 ENCOUNTER — Ambulatory Visit (INDEPENDENT_AMBULATORY_CARE_PROVIDER_SITE_OTHER): Payer: Medicare Other | Admitting: Internal Medicine

## 2013-11-24 VITALS — BP 112/72 | HR 43 | Ht <= 58 in | Wt 144.0 lb

## 2013-11-24 DIAGNOSIS — I509 Heart failure, unspecified: Secondary | ICD-10-CM

## 2013-11-24 DIAGNOSIS — I5031 Acute diastolic (congestive) heart failure: Secondary | ICD-10-CM

## 2013-11-24 DIAGNOSIS — I4891 Unspecified atrial fibrillation: Secondary | ICD-10-CM

## 2013-11-24 MED ORDER — AMIODARONE HCL 400 MG PO TABS: 400.0000 mg | ORAL_TABLET | Freq: Two times a day (BID) | ORAL | Status: DC

## 2013-11-24 NOTE — Telephone Encounter (Signed)
Made pt aware that it was ok to go back to work.

## 2013-11-24 NOTE — Telephone Encounter (Signed)
Left message to call back  

## 2013-11-24 NOTE — Progress Notes (Signed)
HPI [name] returns today for followup. She is a pleasant 58 yo woman with a h/o atrial fib with an RVR who underwent DCCV and initiation of fairly high dose amiodarone therapy after failing AA Rx with rhythmol. She appears to have maintained NSR since discharge from the hospital and her dyspnea is improved. She is starting to get back to her normal self and wants to go back to work. She denies chest pain or sob. No syncope. Allergies  Allergen Reactions  . Hydrocodone-Acetaminophen Nausea Only and Other (See Comments)    Severe headache  . Ibuprofen Other (See Comments)    Kidney disfunction  . Oxycodone Hcl Nausea Only    Headache  . Penicillins Nausea Only and Other (See Comments)    Severe headache  . Flecainide Nausea Only and Other (See Comments)    Faint feeling     Current Outpatient Prescriptions  Medication Sig Dispense Refill  . acetaminophen (TYLENOL) 500 MG tablet Take 1,000 mg by mouth as needed for mild pain or headache.       . allopurinol (ZYLOPRIM) 100 MG tablet Take 1 tablet by mouth Twice daily.      Marland Kitchen amiodarone (PACERONE) 400 MG tablet Take 1 tablet (400 mg total) by mouth 2 (two) times daily.  60 tablet  0  . calcium carbonate (OS-CAL) 600 MG TABS Take 600 mg by mouth 2 (two) times daily with a meal.        . cholecalciferol (VITAMIN D) 1000 UNITS tablet Take 1,000 Units by mouth daily.      Marland Kitchen COLCRYS 0.6 MG tablet Take 1 tablet by mouth Twice daily.      . fenofibrate 160 MG tablet Take 1 tablet by mouth daily.      . fish oil-omega-3 fatty acids 1000 MG capsule Take 1 g by mouth 2 (two) times daily.        . furosemide (LASIX) 20 MG tablet Take 1 tablet (20 mg total) by mouth as needed (For swelling).  30 tablet  3  . glipiZIDE (GLUCOTROL XL) 2.5 MG 24 hr tablet Take 2.5 mg by mouth daily.       Marland Kitchen levothyroxine (SYNTHROID, LEVOTHROID) 112 MCG tablet Take 112 mcg by mouth daily before breakfast.      . loperamide (IMODIUM A-D) 2 MG tablet Take 2 mg  by mouth 4 (four) times daily as needed for diarrhea or loose stools.      Marland Kitchen losartan (COZAAR) 25 MG tablet Take 12.5 mg by mouth daily.       . Magnesium 250 MG TABS Take 1 tablet by mouth daily.       . metoprolol (LOPRESSOR) 50 MG tablet Take 1.5 tablets (75 mg total) by mouth 2 (two) times daily.  60 tablet  6  . omeprazole (PRILOSEC) 20 MG capsule Take 20 mg by mouth daily.      . potassium chloride (K-DUR) 10 MEQ tablet Take 10 mEq by mouth as needed.       . Probiotic Product (ALIGN PO) Take 1 tablet by mouth daily.       . propafenone (RYTHMOL) 150 MG tablet Take 1 tablet (150 mg total) by mouth every 8 (eight) hours. As needed for palpitations  90 tablet  3  . Rivaroxaban (XARELTO) 20 MG TABS tablet Take 1 tablet (20 mg total) by mouth daily with supper.  90 tablet  3  . sitaGLIPtin (JANUVIA) 50 MG tablet Take 50 mg by  mouth daily.        Marland Kitchen zolpidem (AMBIEN CR) 12.5 MG CR tablet Take 12.5 mg by mouth at bedtime as needed for sleep.       No current facility-administered medications for this visit.     Past Medical History  Diagnosis Date  . Microscopic colitis 9/11    Colonoscopy  . Hemorrhoids   . Diverticulosis   . Schatzki's ring     Last EGD with esophageal dilatation 77F  9/11  . Congenital heart disease   . Paroxysmal SVT (supraventricular tachycardia)   . IDA (iron deficiency anemia)   . GERD (gastroesophageal reflux disease)   . Hypothyroidism   . Hypertension   . Hyperlipidemia   . Renal insufficiency   . Gout   . PVD (peripheral vascular disease) 09/2004    Right common femoral endarterectomy in April of 2007  . Hyperparathyroidism   . Scoliosis   . Chronic bronchitis   . Varicose veins right leg pain and swelling  . Atrial fibrillation Jan. 2015  . Fall due to stumbling October 08, 2013    Due to shoes  . Diabetes mellitus without complication     Type II    ROS:   All systems reviewed and negative except as noted in the HPI.   Past Surgical History   Procedure Laterality Date  . Asd repair  Age [facility]  . Right common femoral endarterectomy  2007  . Left parathyroidectomy  2007  . Parathyroid adenoma  2007  . Left hemithyroidectomy    . Tonsillectomy    . Hemorrhoid surgery  2010  . Colonoscopy  03/04/2010    anal canal hemorrhoids otherwise normal. TI normal. Bx showed lymphocytic colitis  . Esophagogastroduodenoscopy  03/04/2010    noncritical appearing Schatzki ring. SB bx negative  . Elas  06-01-11    Right saphenous ELAS   . Cardioversion N/A 11/13/2013    Procedure: CARDIOVERSION;  Surgeon: Herminio Commons, MD;  Location: [facility];  Service: Endoscopy;  Laterality: N/A;     Family History  Problem Relation Age of Onset  . Stroke Mother   . Heart disease Mother     CHF  - Amputation-Right Leg  . Hyperlipidemia Mother   . Hypertension Mother   . Lung cancer Father   . Cancer Father     Lung     History   Social History  . Marital Status: Single    Spouse Name: N/A    Number of Children: N/A  . Years of Education: N/A   Occupational History  . Not on file.   Social History Main Topics  . Smoking status: Never Smoker   . Smokeless tobacco: Never Used  . Alcohol Use: No  . Drug Use: No  . Sexual Activity: No   Other Topics Concern  . Not on file   Social History Narrative  . No narrative on file     BP 112/72  Pulse 43  Ht 4\' 10"  (1.473 m)  Wt 144 lb (65.318 kg)  BMI 30.10 kg/m2  SpO2 98%  Physical Exam:  Well appearing NAD HEENT: Unremarkable Neck:  No JVD, no thyromegally Back:  No CVA tenderness Lungs:  Clear with no wheezes HEART:  Regular brady rhythm, no murmurs, no rubs, no clicks Abd:  soft, positive bowel sounds, no organomegally, no rebound, no guarding Ext:  2 plus pulses, no edema, no cyanosis, no clubbing Skin:  No rashes no nodules Neuro:  CN II through XII intact, motor grossly intact  EKG - sinus bradycardia   Assess/Plan:

## 2013-11-24 NOTE — Patient Instructions (Signed)
Your physician recommends that you schedule a follow-up appointment : In July with Dr Rayann Heman and in September in Dr Lovena Le.   Your physician recommends that you schedule a follow-up appointment in:   STOP RYTHMOL  Continue taking Amiodarone 400 mg BID, START taking 400 mg once a day starting July 1ST

## 2013-11-24 NOTE — Telephone Encounter (Signed)
Patient wants to know if she can go back to work / tgs

## 2013-11-27 NOTE — Assessment & Plan Note (Signed)
She appears to be maintaining NSR on amiodarone. I have asked her to take high dose amio for another month and then go to 400 mg daily.

## 2013-11-27 NOTE — Assessment & Plan Note (Signed)
Her symptoms are largely resolved. She is encouraged to maintain a low sodium diet and to continue her current meds.

## 2013-12-11 ENCOUNTER — Telehealth: Payer: Self-pay | Admitting: Internal Medicine

## 2013-12-11 DIAGNOSIS — I4891 Unspecified atrial fibrillation: Secondary | ICD-10-CM

## 2013-12-11 MED ORDER — AMIODARONE HCL 400 MG PO TABS: 400.0000 mg | ORAL_TABLET | Freq: Every day | ORAL | Status: DC

## 2013-12-11 NOTE — Telephone Encounter (Signed)
Received fax refill request  Rx # [id number] Medication:  Amiodarone 400 mg tablet  Qty 60 Sig:  Take one tablet by mouth twice daily Physician:  Lovena Le

## 2013-12-11 NOTE — Telephone Encounter (Signed)
Starting 12/24/13 amiodarone dose decreased to 400 mg daily NOT BID   I spoke with pt and reiterated this to her,she verbalized understanding

## 2013-12-12 ENCOUNTER — Ambulatory Visit: Payer: Medicare Other | Admitting: Cardiology

## 2013-12-15 ENCOUNTER — Encounter: Payer: Self-pay | Admitting: Adult Health

## 2013-12-15 ENCOUNTER — Ambulatory Visit (INDEPENDENT_AMBULATORY_CARE_PROVIDER_SITE_OTHER): Payer: Medicare Other | Admitting: Adult Health

## 2013-12-15 VITALS — BP 130/62 | HR 49 | Ht <= 58 in | Wt 144.0 lb

## 2013-12-15 DIAGNOSIS — I509 Heart failure, unspecified: Secondary | ICD-10-CM

## 2013-12-15 DIAGNOSIS — I498 Other specified cardiac arrhythmias: Secondary | ICD-10-CM

## 2013-12-15 DIAGNOSIS — R001 Bradycardia, unspecified: Secondary | ICD-10-CM

## 2013-12-15 DIAGNOSIS — I4891 Unspecified atrial fibrillation: Secondary | ICD-10-CM

## 2013-12-15 MED ORDER — AMIODARONE HCL 200 MG PO TABS
200.0000 mg | ORAL_TABLET | Freq: Every day | ORAL | Status: DC
Start: 2013-12-15 — End: 2013-12-31

## 2013-12-15 NOTE — Patient Instructions (Signed)
Your physician recommends that you schedule a follow-up appointment in: Please keep your appointment with Dr Rayann Heman in 1 month.  Your physician has recommended that you wear an event monitor. Event monitors are medical devices that record the heart's electrical activity. Doctors most often [place] these monitors to diagnose arrhythmias. Arrhythmias are problems with the speed or rhythm of the heartbeat. The monitor is a small, portable device. You can wear one while you do your normal daily activities. This is usually used to diagnose what is causing palpitations/syncope (passing out).  2 weeks  Your physician has recommended you make the following change in your medication:   Decreased Amiodarone to 200 mg daily

## 2013-12-15 NOTE — Progress Notes (Deleted)
Name: [name]    DOB: 06/07/1956  Age: 58 y.o.  MR#: [id number]       PCP:  Delphina Cahill, MD      Insurance: Payor: [place] / Plan: BLUE MEDICARE / Product Type: *No Product type* /   CC:    Chief Complaint  Patient presents with  . Atrial Fibrillation  . Congestive Heart Failure    Diastolic    VS Filed Vitals:   12/15/13 1439  BP: 130/62  Pulse: 49  Height: 4\' 10"  (1.473 m)  Weight: 144 lb (65.318 kg)    Weights Current Weight  12/15/13 144 lb (65.318 kg)  11/24/13 144 lb (65.318 kg)  11/15/13 148 lb 5.9 oz (67.3 kg)    Blood Pressure  BP Readings from Last 3 Encounters:  12/15/13 130/62  11/24/13 112/72  11/15/13 121/85     Admit date:  (Not on file) Last encounter with RMR:  Visit date not found   Allergy Hydrocodone-acetaminophen; Ibuprofen; Oxycodone hcl; Penicillins; and Flecainide  Current Outpatient Prescriptions  Medication Sig Dispense Refill  . acetaminophen (TYLENOL) 500 MG tablet Take 1,000 mg by mouth as needed for mild pain or headache.       . allopurinol (ZYLOPRIM) 100 MG tablet Take 1 tablet by mouth Twice daily.      Marland Kitchen amiodarone (PACERONE) 400 MG tablet Take 1 tablet (400 mg total) by mouth daily.  60 tablet  6  . calcium carbonate (OS-CAL) 600 MG TABS Take 600 mg by mouth 2 (two) times daily with a meal.        . cholecalciferol (VITAMIN D) 1000 UNITS tablet Take 1,000 Units by mouth daily.      Marland Kitchen COLCRYS 0.6 MG tablet Take 1 tablet by mouth Twice daily.      . fenofibrate 160 MG tablet Take 1 tablet by mouth daily.      . fish oil-omega-3 fatty acids 1000 MG capsule Take 1 g by mouth 2 (two) times daily.        . furosemide (LASIX) 20 MG tablet Take 1 tablet (20 mg total) by mouth as needed (For swelling).  30 tablet  3  . levothyroxine (SYNTHROID, LEVOTHROID) 112 MCG tablet Take 112 mcg by mouth daily before breakfast.      . loperamide (IMODIUM A-D) 2 MG tablet Take 2 mg by mouth 4 (four) times daily as  needed for diarrhea or loose stools.      Marland Kitchen losartan (COZAAR) 25 MG tablet Take 12.5 mg by mouth daily.       . Magnesium 250 MG TABS Take 1 tablet by mouth daily.       . metoprolol (LOPRESSOR) 50 MG tablet 1/2 tab bid      . omeprazole (PRILOSEC) 20 MG capsule Take 20 mg by mouth daily.      . potassium chloride (K-DUR) 10 MEQ tablet Take 10 mEq by mouth as needed.       . Probiotic Product (ALIGN PO) Take 1 tablet by mouth daily.       . Rivaroxaban (XARELTO) 20 MG TABS tablet Take 1 tablet (20 mg total) by mouth daily with supper.  90 tablet  3  . sitaGLIPtin (JANUVIA) 50 MG tablet Take 50 mg by mouth daily.        Marland Kitchen zolpidem (AMBIEN CR) 12.5 MG CR tablet Take 12.5 mg by mouth at bedtime as needed for sleep.       No  current facility-administered medications for this visit.    Discontinued Meds:    Medications Discontinued During This Encounter  Medication Reason  . glipiZIDE (GLUCOTROL XL) 2.5 MG 24 hr tablet Error  . metoprolol (LOPRESSOR) 50 MG tablet     Patient Active Problem List   Diagnosis Date Noted  . Atrial fibrillation with RVR 11/09/2013  . Acute diastolic CHF (congestive heart failure) 11/09/2013  . CKD (chronic kidney disease), stage III 11/09/2013  . Congestive heart failure 11/07/2013  . Chronic a-fib 11/07/2013  . CKD (chronic kidney disease) 11/07/2013  . Hyperkalemia 11/07/2013  . Diastolic dysfunction [id number]  . DM (diabetes mellitus) 11/07/2013  . CHF (congestive heart failure) 11/07/2013  . Varicose veins of lower extremities with ulcer 10/17/2013  . Cold-Left Hand / Foot 10/08/2013  . Circulation problem-Left foot 10/08/2013  . Atherosclerosis of native arteries of the extremities with ulceration(440.23) 10/08/2013  . A-fib 08/05/2013  . Unspecified constipation 04/11/2013  . Circulation problem 10/27/2011  . Varicose veins of lower extremities with other complications 0000000  . COLLAGENOUS COLITIS 04/08/2010  . GERD 02/18/2010  .  HYPERTHYROIDISM 02/11/2010    LABS    Component Value Date/Time   NA 137 11/15/2013 0500   NA 138 11/12/2013 0604   NA 138 11/11/2013 0554   NA 141 07/29/2010   K 4.5 11/15/2013 0500   K 4.4 11/12/2013 0604   K 4.0 11/11/2013 0554   K 3.6 07/29/2010   CL 98 11/15/2013 0500   CL 96 11/12/2013 0604   CL 93* 11/11/2013 0554   CL 103 07/29/2010   CO2 27 11/15/2013 0500   CO2 27 11/12/2013 0604   CO2 35* 11/11/2013 0554   CO2 25 07/29/2010   GLUCOSE 115* 11/15/2013 0500   GLUCOSE 112* 11/12/2013 0604   GLUCOSE 108* 11/11/2013 0554   BUN 34* 11/15/2013 0500   BUN 39* 11/12/2013 0604   BUN 39* 11/11/2013 0554   BUN 16 07/29/2010   CREATININE 1.69* 11/15/2013 0500   CREATININE 1.75* 11/12/2013 0604   CREATININE 1.85* 11/11/2013 0554   CREATININE 1.76* 08/26/2013 0816   CREATININE 1.41* 04/30/2013 1002   CREATININE 1.21 07/29/2010   CALCIUM 9.5 11/15/2013 0500   CALCIUM 9.6 11/12/2013 0604   CALCIUM 10.2 11/11/2013 0554   CALCIUM 9.4 07/29/2010   GFRNONAA 33* 11/15/2013 0500   GFRNONAA 31* 11/12/2013 0604   GFRNONAA 29* 11/11/2013 0554   GFRNONAA 32* 08/26/2013 0816   GFRAA 38* 11/15/2013 0500   GFRAA 36* 11/12/2013 0604   GFRAA 34* 11/11/2013 0554   GFRAA 36* 08/26/2013 0816   CMP     Component Value Date/Time   NA 137 11/15/2013 0500   NA 141 07/29/2010   K 4.5 11/15/2013 0500   K 3.6 07/29/2010   CL 98 11/15/2013 0500   CL 103 07/29/2010   CO2 27 11/15/2013 0500   CO2 25 07/29/2010   GLUCOSE 115* 11/15/2013 0500   BUN 34* 11/15/2013 0500   BUN 16 07/29/2010   CREATININE 1.69* 11/15/2013 0500   CREATININE 1.76* 08/26/2013 0816   CALCIUM 9.5 11/15/2013 0500   CALCIUM 9.4 07/29/2010   PROT 7.4 11/06/2013 2126   ALBUMIN 3.6 11/06/2013 2126   AST 34 11/06/2013 2126   ALT 19 11/06/2013 2126   ALKPHOS 31* 11/06/2013 2126   BILITOT 1.1 11/06/2013 2126   GFRNONAA 33* 11/15/2013 0500   GFRNONAA 32* 08/26/2013 0816   GFRAA 38* 11/15/2013 0500   GFRAA 36* 08/26/2013 [id number]  Component Value Date/Time   WBC 9.2 11/12/2013 0604   WBC 8.5  11/06/2013 2126   WBC 6.3 04/30/2013 1002   HGB 13.2 11/12/2013 0604   HGB 13.9 11/06/2013 2126   HGB 13.0 04/30/2013 1002   HCT 42.0 11/12/2013 0604   HCT 43.6 11/06/2013 2126   HCT 39.0 04/30/2013 1002   MCV 88.8 11/12/2013 0604   MCV 89.5 11/06/2013 2126   MCV 92.4 04/30/2013 1002    Lipid Panel  No results found for this basename: chol, trig, hdl, cholhdl, vldl, ldlcalc    ABG No results found for this basename: phart, pco2, pco2art, po2, po2art, hco3, tco2, acidbasedef, o2sat     Lab Results  Component Value Date   TSH 6.640* 11/07/2013   BNP (last 3 results)  Recent Labs  11/06/13 2126  PROBNP 5085.0*   Cardiac Panel (last 3 results) No results found for this basename: CKTOTAL, CKMB, TROPONINI, RELINDX,  in the last 72 hours  Iron/TIBC/Ferritin    Component Value Date/Time   FERRITIN 34 06/24/2008 1646     EKG Orders placed during the hospital encounter of 11/06/13  . EKG 12-LEAD  . EKG  . EKG 12-LEAD  . EKG 12-LEAD  . EKG 12-LEAD  . EKG 12-LEAD  . EKG 12-LEAD  . EKG 12-LEAD     Prior Assessment and Plan Problem List as of 12/15/2013     Cardiovascular and Mediastinum   Varicose veins of lower extremities with other complications   Circulation problem   A-fib   Last Assessment & Plan   11/24/2013 Office Visit Written 11/27/2013  7:00 PM by Evans Lance, MD     She appears to be maintaining NSR on amiodarone. I have asked her to take high dose amio for another month and then go to 400 mg daily.     Circulation problem-Left foot   Atherosclerosis of native arteries of the extremities with ulceration(440.23)   Varicose veins of lower extremities with ulcer   Congestive heart failure   Chronic a-fib   CHF (congestive heart failure)   Atrial fibrillation with RVR   Acute diastolic CHF (congestive heart failure)   Last Assessment & Plan   11/24/2013 Office Visit Written 11/27/2013  7:01 PM by Evans Lance, MD     Her symptoms are largely resolved. She is  encouraged to maintain a low sodium diet and to continue her current meds.      Respiratory   Cold-Left Hand / Foot     Digestive   GERD   Last Assessment & Plan   04/11/2013 Office Visit Written 04/11/2013 10:43 AM by Mahala Menghini, PA-C     Continue omeprazole 20 mg twice a day. Anti-reflex measures. Try to lose at least 5 pounds in the next couple of months.    Unspecified constipation   Last Assessment & Plan   04/11/2013 Office Visit Written 04/11/2013 10:44 AM by Mahala Menghini, PA-C     You may take MiraLax 1 capful at bedtime on days you do not have adequate bowel movement.      Endocrine   HYPERTHYROIDISM   DM (diabetes mellitus)     Genitourinary   CKD (chronic kidney disease)   CKD (chronic kidney disease), stage III     Other   COLLAGENOUS COLITIS   Last Assessment & Plan   05/09/2011 Office Visit Edited 05/09/2011  5:22 PM by Andria Meuse, NP     Doing well. Continue Align daily  Hyperkalemia   Diastolic dysfunction       Imaging: No results found.

## 2013-12-15 NOTE — Progress Notes (Signed)
HPI [name] is a 58 year old patient of Dr. Marya Amsler lower following for ongoing assessment and management of atrial fibrillation, with RVR who underwent DCCV and initiation of fairly high dose of amiodarone therapy. She was last seen by Dr. Lovena Le in June of 2015 and maintaining normal sinus rhythm.   She comes today with complaints of low heart rate. I last office visit with Dr. Lovena Le, the patient was advised to continue Rythmol 150 mg every 8 hours when necessary palpitations in continue on amiodarone 400 mg twice a day, with decreased to 400 mg daily in one month.  She comes today with complaints of sudden nausea, dizziness, and falling forward injuring her nose and lips, she occasionally has some rapid heart rhythm but they only last seconds at a time. She is now taking Rythmol, in fact she states that she does not even know where the medication bottle is. She continues on amiodarone as directed. She has an appointment with Dr. Rayann Heman the first part of July for discussion of atrial fibrillation.   Allergies  Allergen Reactions  . Hydrocodone-Acetaminophen Nausea Only and Other (See Comments)    Severe headache  . Ibuprofen Other (See Comments)    Kidney disfunction  . Oxycodone Hcl Nausea Only    Headache  . Penicillins Nausea Only and Other (See Comments)    Severe headache  . Flecainide Nausea Only and Other (See Comments)    Faint feeling    Current Outpatient Prescriptions  Medication Sig Dispense Refill  . acetaminophen (TYLENOL) 500 MG tablet Take 1,000 mg by mouth as needed for mild pain or headache.       . allopurinol (ZYLOPRIM) 100 MG tablet Take 1 tablet by mouth Twice daily.      Marland Kitchen amiodarone (PACERONE) 200 MG tablet Take 1 tablet (200 mg total) by mouth daily.  60 tablet  6  . calcium carbonate (OS-CAL) 600 MG TABS Take 600 mg by mouth 2 (two) times daily with a meal.        . cholecalciferol (VITAMIN D) 1000 UNITS tablet Take 1,000 Units by mouth daily.      Marland Kitchen  COLCRYS 0.6 MG tablet Take 1 tablet by mouth Twice daily.      . fenofibrate 160 MG tablet Take 1 tablet by mouth daily.      . fish oil-omega-3 fatty acids 1000 MG capsule Take 1 g by mouth 2 (two) times daily.        . furosemide (LASIX) 20 MG tablet Take 1 tablet (20 mg total) by mouth as needed (For swelling).  30 tablet  3  . levothyroxine (SYNTHROID, LEVOTHROID) 112 MCG tablet Take 112 mcg by mouth daily before breakfast.      . loperamide (IMODIUM A-D) 2 MG tablet Take 2 mg by mouth 4 (four) times daily as needed for diarrhea or loose stools.      Marland Kitchen losartan (COZAAR) 25 MG tablet Take 12.5 mg by mouth daily.       . Magnesium 250 MG TABS Take 1 tablet by mouth daily.       . metoprolol (LOPRESSOR) 50 MG tablet 1/2 tab bid      . omeprazole (PRILOSEC) 20 MG capsule Take 20 mg by mouth daily.      . potassium chloride (K-DUR) 10 MEQ tablet Take 10 mEq by mouth as needed.       . Probiotic Product (ALIGN PO) Take 1 tablet by mouth daily.       Marland Kitchen  Rivaroxaban (XARELTO) 20 MG TABS tablet Take 1 tablet (20 mg total) by mouth daily with supper.  90 tablet  3  . sitaGLIPtin (JANUVIA) 50 MG tablet Take 50 mg by mouth daily.        Marland Kitchen zolpidem (AMBIEN CR) 12.5 MG CR tablet Take 12.5 mg by mouth at bedtime as needed for sleep.       No current facility-administered medications for this visit.    Past Medical History  Diagnosis Date  . Microscopic colitis 9/11    Colonoscopy  . Hemorrhoids   . Diverticulosis   . Schatzki's ring     Last EGD with esophageal dilatation 52F  9/11  . Congenital heart disease   . Paroxysmal SVT (supraventricular tachycardia)   . IDA (iron deficiency anemia)   . GERD (gastroesophageal reflux disease)   . Hypothyroidism   . Hypertension   . Hyperlipidemia   . Renal insufficiency   . Gout   . PVD (peripheral vascular disease) 09/2004    Right common femoral endarterectomy in April of 2007  . Hyperparathyroidism   . Scoliosis   . Chronic bronchitis   .  Varicose veins right leg pain and swelling  . Atrial fibrillation Jan. 2015  . Fall due to stumbling October 08, 2013    Due to shoes  . Diabetes mellitus without complication     Type II    Past Surgical History  Procedure Laterality Date  . Asd repair  Age [facility]  . Right common femoral endarterectomy  2007  . Left parathyroidectomy  2007  . Parathyroid adenoma  2007  . Left hemithyroidectomy    . Tonsillectomy    . Hemorrhoid surgery  2010  . Colonoscopy  03/04/2010    anal canal hemorrhoids otherwise normal. TI normal. Bx showed lymphocytic colitis  . Esophagogastroduodenoscopy  03/04/2010    noncritical appearing Schatzki ring. SB bx negative  . Elas  06-01-11    Right saphenous ELAS   . Cardioversion N/A 11/13/2013    Procedure: CARDIOVERSION;  Surgeon: Herminio Commons, MD;  Location: [facility];  Service: Endoscopy;  Laterality: N/A;    ROS Review of systems complete and found to be negative unless listed above  PHYSICAL EXAM BP 130/62  Pulse 49  Ht 4\' 10"  (1.473 m)  Wt 144 lb (65.318 kg)  BMI 30.10 kg/m2 General: Well developed, well nourished, in no acute distress Head: Eyes PERRLA, No xanthomas.   Normal cephalic and erythematous nose and upper lip, no significant swelling. Lungs: Clear bilaterally to auscultation and percussion. Heart: HRRR S1 S2, bradycardic without MRG.  Pulses are 2+ & equal.            No carotid bruit. No JVD.  No abdominal bruits. No femoral bruits. Abdomen: Bowel sounds are positive, abdomen soft and non-tender without masses or                  Hernia's noted. Msk:  Back normal, normal gait. Normal strength and tone for age. Extremities: No clubbing, cyanosis or edema.  DP +1 Neuro: Alert and oriented X 3. Psych:  Good affect, responds appropriately   EKG: Sinus bradycardia rate of 45 beats per minute, Q waves noted inferior lateral leads.  ASSESSMENT AND PLAN

## 2013-12-15 NOTE — Assessment & Plan Note (Signed)
Remains in normal sinus rhythm but very bradycardic with a rate of 45 beats per minute. PR interval is 0.186 ms. There are no AV blocks noted. The patient is on amiodarone 400 mg twice a day, she has not been taking any Rythmol. The patient is having significant dizziness, and actually fell forward injuring her face and lip, without losing consciousness, but was presyncopal.  I have called and spoken with Dr. Crissie Sickles by phone over at [facility] with questions concerning going ahead and decreasing the amiodarone down to 400 mg as he had recommended on last office visit. I also want to place a monitor on the patient for couple of weeks to evaluate her heart rate control, along with rhythm disturbances.  After speaking with Dr. Lovena Le, he wants to decrease her amiodarone down to 200 mg daily. He is in agreement with the cardiac monitor. This will be placed on for the next 2 weeks. Hopefully this will be read by followup appointment with Dr. Rayann Heman in 3 weeks. This has been explained to the patient who verbalizes understanding and is willing to wear the monitor. She is report any further instances of passing out, near-syncope, or rapid heart rhythm.

## 2013-12-15 NOTE — Assessment & Plan Note (Signed)
No evidence of fluid retention currently. She has been having issues of breathing. Her weight is stable.

## 2013-12-31 ENCOUNTER — Ambulatory Visit (INDEPENDENT_AMBULATORY_CARE_PROVIDER_SITE_OTHER): Payer: Medicare Other | Admitting: Internal Medicine

## 2013-12-31 ENCOUNTER — Encounter: Payer: Self-pay | Admitting: Internal Medicine

## 2013-12-31 VITALS — BP 122/71 | HR 62 | Ht 58.5 in | Wt 140.0 lb

## 2013-12-31 DIAGNOSIS — R0609 Other forms of dyspnea: Secondary | ICD-10-CM

## 2013-12-31 DIAGNOSIS — R400 Somnolence: Secondary | ICD-10-CM

## 2013-12-31 DIAGNOSIS — R0989 Other specified symptoms and signs involving the circulatory and respiratory systems: Secondary | ICD-10-CM

## 2013-12-31 DIAGNOSIS — I5189 Other ill-defined heart diseases: Secondary | ICD-10-CM

## 2013-12-31 DIAGNOSIS — G4733 Obstructive sleep apnea (adult) (pediatric): Secondary | ICD-10-CM | POA: Insufficient documentation

## 2013-12-31 DIAGNOSIS — I519 Heart disease, unspecified: Secondary | ICD-10-CM

## 2013-12-31 DIAGNOSIS — G4710 Hypersomnia, unspecified: Secondary | ICD-10-CM

## 2013-12-31 DIAGNOSIS — R0683 Snoring: Secondary | ICD-10-CM

## 2013-12-31 DIAGNOSIS — I4891 Unspecified atrial fibrillation: Secondary | ICD-10-CM

## 2013-12-31 MED ORDER — AMIODARONE HCL 200 MG PO TABS: 200.0000 mg | ORAL_TABLET | Freq: Every day | ORAL | Status: DC

## 2013-12-31 NOTE — Patient Instructions (Signed)
(  PLEASE SCHEDULE ECHO IN [place]) Your physician has requested that you have an echocardiogram. Echocardiography is a painless test that uses sound waves to create images of your heart. It provides your doctor with information about the size and shape of your heart and how well your heart's chambers and valves are working. This procedure takes approximately one hour. There are no restrictions for this procedure.  Your physician has recommended that you have a sleep study. This test records several body functions during sleep, including: brain activity, eye movement, oxygen and carbon dioxide blood levels, heart rate and rhythm, breathing rate and rhythm, the flow of air through your mouth and nose, snoring, body muscle movements, and chest and belly movement.  (PLEASE SCHEDULE FOR September 2) Your physician recommends that you schedule a follow-up appointment in: [place], [name] physician has recommended you make the following change in your medication:  DECREASE AMIODARONE TO 200 MG ONCE A DAY.

## 2013-12-31 NOTE — Progress Notes (Signed)
Primary Care Physician: Delphina Cahill, MD Referring Physician: Dr. Vangie Bicker is a 58 y.o. female with a h/o atrial fibrillation here for further treatment options. Patient gives a history last October of having an irregular heartbeat for several days.  Her heart rate did return to normal. She was hospitalized in May of this year with A. fib with RVR and heart failure. She was diuresed and lost from 156 lbs to current 140. Lbs. She was loaded on amiodarone and cardioverted. She continues on amiodarone 200 mg 2 times a day. She was also started on Xarelto 20 mg daily. Been tolerating this well without any bleeding. Currently, she feels very well maintaining sinus rhythm on amiodarone. She does have a chads2vasc score of  at least 3. Has many symptoms of sleep apnea including daytime somnolence. By echo last fall she had severe left atrial enlargement and discussed with patient repeating this study.  Today, she denies symptoms of palpitations, chest pain, shortness of breath, orthopnea, PND, lower extremity edema, dizziness, presyncope, syncope, or neurologic sequela. The patient is tolerating medications without difficulties and is otherwise without complaint today.   Past Medical History  Diagnosis Date  . Microscopic colitis 9/11    Colonoscopy  . Hemorrhoids   . Diverticulosis   . Schatzki's ring     Last EGD with esophageal dilatation 20F  9/11  . Congenital heart disease   . Paroxysmal SVT (supraventricular tachycardia)   . IDA (iron deficiency anemia)   . GERD (gastroesophageal reflux disease)   . Hypothyroidism   . Hypertension   . Hyperlipidemia   . Renal insufficiency   . Gout   . PVD (peripheral vascular disease) 09/2004    Right common femoral endarterectomy in April of 2007  . Hyperparathyroidism   . Scoliosis   . Chronic bronchitis   . Varicose veins right leg pain and swelling  . Atrial fibrillation Jan. 2015  . Fall due to stumbling October 08, 2013    Due to  shoes  . Diabetes mellitus without complication     Type II   Past Surgical History  Procedure Laterality Date  . Asd repair  Age [facility]  . Right common femoral endarterectomy  2007  . Left parathyroidectomy  2007  . Parathyroid adenoma  2007  . Left hemithyroidectomy    . Tonsillectomy    . Hemorrhoid surgery  2010  . Colonoscopy  03/04/2010    anal canal hemorrhoids otherwise normal. TI normal. Bx showed lymphocytic colitis  . Esophagogastroduodenoscopy  03/04/2010    noncritical appearing Schatzki ring. SB bx negative  . Elas  06-01-11    Right saphenous ELAS   . Cardioversion N/A 11/13/2013    Procedure: CARDIOVERSION;  Surgeon: Herminio Commons, MD;  Location: [facility];  Service: Endoscopy;  Laterality: N/A;    Current Outpatient Prescriptions  Medication Sig Dispense Refill  . acetaminophen (TYLENOL) 500 MG tablet Take 1,000 mg by mouth as needed for mild pain or headache.       . allopurinol (ZYLOPRIM) 100 MG tablet Take 2 tablets by mouth daily.       Marland Kitchen amiodarone (PACERONE) 200 MG tablet Take 1 tablet (200 mg total) by mouth daily.  30 tablet  6  . calcium carbonate (OS-CAL) 600 MG TABS Take 600 mg by mouth 2 (two) times daily with a meal.        . cholecalciferol (VITAMIN D) 1000 UNITS  tablet Take 1,000 Units by mouth daily.      Marland Kitchen COLCRYS 0.6 MG tablet Take 1 tablet by mouth as needed.       . fenofibrate 160 MG tablet Take 1 tablet by mouth daily.      . fish oil-omega-3 fatty acids 1000 MG capsule Take 1 g by mouth 2 (two) times daily.        . furosemide (LASIX) 20 MG tablet Take 1 tablet (20 mg total) by mouth as needed (For swelling).  30 tablet  3  . levothyroxine (SYNTHROID, LEVOTHROID) 112 MCG tablet Take 112 mcg by mouth daily before breakfast.      . loperamide (IMODIUM A-D) 2 MG tablet Take 2 mg by mouth 4 (four) times daily as needed for diarrhea or loose stools.      Marland Kitchen losartan (COZAAR) 25 MG tablet Take 12.5 mg by  mouth daily.       . Magnesium 250 MG TABS Take 1 tablet by mouth daily.       . metoprolol (LOPRESSOR) 50 MG tablet Take 1 1/2 tablet twice a day      . omeprazole (PRILOSEC) 20 MG capsule Take 20 mg by mouth daily.      . potassium chloride (K-DUR) 10 MEQ tablet Take 10 mEq by mouth as needed.       . Probiotic Product (ALIGN PO) Take 1 tablet by mouth daily.       . Rivaroxaban (XARELTO) 20 MG TABS tablet Take 1 tablet (20 mg total) by mouth daily with supper.  90 tablet  3  . sitaGLIPtin (JANUVIA) 100 MG tablet Take 50 mg by mouth daily.      Marland Kitchen zolpidem (AMBIEN CR) 12.5 MG CR tablet Take 12.5 mg by mouth at bedtime as needed for sleep.       No current facility-administered medications for this visit.    Allergies  Allergen Reactions  . Hydrocodone-Acetaminophen Nausea Only and Other (See Comments)    Severe headache  . Ibuprofen Other (See Comments)    Kidney dysfunction  . Oxycodone Hcl Nausea Only    Headache  . Penicillins Nausea Only and Other (See Comments)    Severe headache  . Flecainide Nausea Only and Other (See Comments)    Faint feeling    History   Social History  . Marital Status: Single    Spouse Name: N/A    Number of Children: N/A  . Years of Education: N/A   Occupational History  . Not on file.   Social History Main Topics  . Smoking status: Never Smoker   . Smokeless tobacco: Never Used  . Alcohol Use: No  . Drug Use: No  . Sexual Activity: No   Other Topics Concern  . Not on file   Social History Narrative  . No narrative on file    Family History  Problem Relation Age of Onset  . Stroke Mother   . Heart disease Mother     CHF  - Amputation-Right Leg  . Hyperlipidemia Mother   . Hypertension Mother   . Lung cancer Father   . Cancer Father     Lung    ROS- All systems are reviewed and negative except as per the HPI above  Physical Exam: Filed Vitals:   12/31/13 1436  BP: 122/71  Pulse: 62  Height: 4' 10.5" (1.486 m)    Weight: 63.504 kg (140 lb)    GEN- The patient is well appearing, alert and  oriented x 3 today.   Head- normocephalic, atraumatic Eyes-  Sclera clear, conjunctiva pink Ears- hearing intact Oropharynx- clear Neck- supple, no JVP Lymph- no cervical lymphadenopathy Lungs- Clear to ausculation bilaterally, normal work of breathing Heart- Regular rate and rhythm, no murmurs, rubs or gallops, PMI not laterally displaced GI- soft, NT, ND, + BS Extremities- no clubbing, cyanosis, or edema MS- no significant deformity or atrophy Skin- no rash or lesion Psych- euthymic mood, full affect Neuro- strength and sensation are intact  EKG-sinus rhythm with first degree AV block with QTC of 483 ms. There is evidence for anterior lateral infarct age undetermined as well as inferior infarct age undetermined.   Assessment and Plan:  1. Persisent atrial fibrillation  Decrease amiodarone to 200 mg one time a day, hopefully will maintain normal sinus rhythm and reduce possibility of  side effects. Currently patient feels much better in normal sinus rhythm.  She is not presently interested in ablation.  If she has additional afib or does not tolerate her amiodarone then she may be more willing to proceed.  2. Chads2vasc score of at least 3. Continue Xarelto 20 mg daily to minimize stroke risk.  3. Possible sleep apnea Sleep study to be ordered  4. Left atrial enlargement last echo fall 2014. Repeat 2D echocardiogram to evaluate LA size  5. Recheck in 3 months with TSH liver labs at that time.  Return to the afib clinic in 3 months to see Roderic Palau NP

## 2014-01-05 ENCOUNTER — Ambulatory Visit (HOSPITAL_COMMUNITY)
Admission: RE | Admit: 2014-01-05 | Discharge: 2014-01-05 | Disposition: A | Discharge status: Home / Self Care | Payer: Medicare Other | Source: Ambulatory Visit | Attending: Internal Medicine | Admitting: Internal Medicine

## 2014-01-05 DIAGNOSIS — I4891 Unspecified atrial fibrillation: Secondary | ICD-10-CM | POA: Insufficient documentation

## 2014-01-05 DIAGNOSIS — I080 Rheumatic disorders of both mitral and aortic valves: Secondary | ICD-10-CM | POA: Insufficient documentation

## 2014-01-05 DIAGNOSIS — E785 Hyperlipidemia, unspecified: Secondary | ICD-10-CM | POA: Insufficient documentation

## 2014-01-05 DIAGNOSIS — R400 Somnolence: Secondary | ICD-10-CM

## 2014-01-05 DIAGNOSIS — E119 Type 2 diabetes mellitus without complications: Secondary | ICD-10-CM | POA: Insufficient documentation

## 2014-01-05 DIAGNOSIS — I369 Nonrheumatic tricuspid valve disorder, unspecified: Secondary | ICD-10-CM

## 2014-01-05 DIAGNOSIS — I10 Essential (primary) hypertension: Secondary | ICD-10-CM | POA: Insufficient documentation

## 2014-01-05 NOTE — Progress Notes (Signed)
  Echocardiogram 2D Echocardiogram has been performed.  Harrod, [place] 01/05/2014, 1:50 PM

## 2014-01-22 ENCOUNTER — Encounter: Payer: Self-pay | Admitting: Adult Health

## 2014-02-03 ENCOUNTER — Other Ambulatory Visit: Payer: Self-pay | Admitting: Cardiology

## 2014-02-16 ENCOUNTER — Telehealth: Payer: Self-pay

## 2014-02-16 NOTE — Telephone Encounter (Signed)
End of service report from ecardio placed in Dr.Branch's folder on his desk to read

## 2014-02-25 ENCOUNTER — Ambulatory Visit (INDEPENDENT_AMBULATORY_CARE_PROVIDER_SITE_OTHER): Payer: Medicare Other | Admitting: Internal Medicine

## 2014-02-25 ENCOUNTER — Encounter: Payer: Self-pay | Admitting: Internal Medicine

## 2014-02-25 VITALS — BP 112/86 | HR 54 | Ht 58.5 in | Wt 149.4 lb

## 2014-02-25 DIAGNOSIS — R0683 Snoring: Secondary | ICD-10-CM

## 2014-02-25 DIAGNOSIS — I4819 Other persistent atrial fibrillation: Secondary | ICD-10-CM

## 2014-02-25 DIAGNOSIS — R0609 Other forms of dyspnea: Secondary | ICD-10-CM

## 2014-02-25 DIAGNOSIS — R0989 Other specified symptoms and signs involving the circulatory and respiratory systems: Secondary | ICD-10-CM

## 2014-02-25 DIAGNOSIS — I4891 Unspecified atrial fibrillation: Secondary | ICD-10-CM

## 2014-02-25 MED ORDER — RIVAROXABAN 15 MG PO TABS: 15.0000 mg | ORAL_TABLET | Freq: Every day | ORAL | Status: DC

## 2014-02-25 NOTE — Progress Notes (Signed)
Primary Care Physician: Delphina Cahill, MD Referring Physician: Dr. Vangie Bicker is a 58 y.o. female with a h/o atrial fibrillation here for electrophysiology follow up . Patient gives a history last October of having an irregular heartbeat for several days.  Her heart rate did return to normal. She was hospitalized in May of this year with A. fib with RVR and heart failure. She was diuresed.Marland Kitchen She was loaded on amiodarone and cardioverted. She continues on amiodarone 200 mg a day.Marland Kitchen She was also started on Xarelto 20 mg daily. Been tolerating this well without any bleeding. Currently, she feels very well maintaining sinus rhythm on amiodarone. She does have a chads2vasc score of  at least 3. Has many symptoms of sleep apnea including daytime somnolence pending sleep study 9/14. By repeat echo,has severe left atrial enlargement . At times patient notices some dizziness and  will stop metoprolol. Patient reports this was recently reduced by another provider recently for bradycardia.  Today, she denies symptoms of palpitations, chest pain, shortness of breath, orthopnea, PND, lower extremity edema, dizziness, presyncope, syncope, or neurologic sequela. The patient is tolerating medications without difficulties and is otherwise without complaint today.   Past Medical History  Diagnosis Date  . Microscopic colitis 9/11    Colonoscopy  . Hemorrhoids   . Diverticulosis   . Schatzki's ring     Last EGD with esophageal dilatation 23F  9/11  . Congenital heart disease   . Paroxysmal SVT (supraventricular tachycardia)   . IDA (iron deficiency anemia)   . GERD (gastroesophageal reflux disease)   . Hypothyroidism   . Hypertension   . Hyperlipidemia   . Renal insufficiency   . Gout   . PVD (peripheral vascular disease) 09/2004    Right common femoral endarterectomy in April of 2007  . Hyperparathyroidism   . Scoliosis   . Chronic bronchitis   . Varicose veins right leg pain and  swelling  . Atrial fibrillation Jan. 2015  . Fall due to stumbling October 08, 2013    Due to shoes  . Diabetes mellitus without complication     Type II   Past Surgical History  Procedure Laterality Date  . Asd repair  Age [facility]  . Right common femoral endarterectomy  2007  . Left parathyroidectomy  2007  . Parathyroid adenoma  2007  . Left hemithyroidectomy    . Tonsillectomy    . Hemorrhoid surgery  2010  . Colonoscopy  03/04/2010    anal canal hemorrhoids otherwise normal. TI normal. Bx showed lymphocytic colitis  . Esophagogastroduodenoscopy  03/04/2010    noncritical appearing Schatzki ring. SB bx negative  . Elas  06-01-11    Right saphenous ELAS   . Cardioversion N/A 11/13/2013    Procedure: CARDIOVERSION;  Surgeon: Herminio Commons, MD;  Location: [facility];  Service: Endoscopy;  Laterality: N/A;    Current Outpatient Prescriptions  Medication Sig Dispense Refill  . acetaminophen (TYLENOL) 500 MG tablet Take 500 mg by mouth as needed for mild pain or headache.       . allopurinol (ZYLOPRIM) 100 MG tablet Take 100 mg by mouth daily.       Marland Kitchen amiodarone (PACERONE) 200 MG tablet Take 200 mg by mouth daily.       . calcium carbonate (OS-CAL) 600 MG TABS Take 600 mg by mouth 2 (two) times daily with a meal.        .  cholecalciferol (VITAMIN D) 1000 UNITS tablet Take 1,000 Units by mouth daily.      Marland Kitchen COLCRYS 0.6 MG tablet Take 1 tablet by mouth as needed (gout).       . fenofibrate 160 MG tablet Take 1 tablet by mouth daily.      . furosemide (LASIX) 20 MG tablet Take 20 mg by mouth daily.      Marland Kitchen levothyroxine (SYNTHROID, LEVOTHROID) 112 MCG tablet Take 112 mcg by mouth daily before breakfast.      . loperamide (IMODIUM A-D) 2 MG tablet Take 2 mg by mouth 4 (four) times daily as needed for diarrhea or loose stools.      Marland Kitchen losartan (COZAAR) 25 MG tablet Take 12.5 mg by mouth daily.       . Magnesium 250 MG TABS Take 1 tablet by mouth  daily.       . metoprolol (LOPRESSOR) 50 MG tablet Take 1 1/2 tablet twice a day      . Omega-3 Fatty Acids (FISH OIL PO) Take 360 mg by mouth 2 (two) times daily.      Marland Kitchen omeprazole (PRILOSEC) 20 MG capsule Take 20 mg by mouth 2 (two) times daily before a meal.       . potassium chloride (K-DUR) 10 MEQ tablet Take 10 mEq by mouth daily.       . Probiotic Product (ALIGN PO) Take 1 tablet by mouth daily.       . sitaGLIPtin (JANUVIA) 100 MG tablet Take 50 mg by mouth daily.      Alveda Reasons 20 MG TABS tablet TAKE 1 TABLET ONCE DAILY WITH SUPPER  30 tablet  3  . zolpidem (AMBIEN CR) 12.5 MG CR tablet Take 12.5 mg by mouth at bedtime as needed for sleep.       No current facility-administered medications for this visit.    Allergies  Allergen Reactions  . Flecainide Nausea Only and Other (See Comments)    Faint feeling  . Hydrocodone-Acetaminophen Nausea Only and Other (See Comments)    Severe headache  . Ibuprofen Other (See Comments)    Kidney dysfunction  . Oxycodone Hcl Nausea Only and Other (See Comments)    Headache  . Penicillins Nausea Only and Other (See Comments)    Severe headache    History   Social History  . Marital Status: Single    Spouse Name: N/A    Number of Children: N/A  . Years of Education: N/A   Occupational History  . Not on file.   Social History Main Topics  . Smoking status: Never Smoker   . Smokeless tobacco: Never Used  . Alcohol Use: No  . Drug Use: No  . Sexual Activity: No   Other Topics Concern  . Not on file   Social History Narrative  . No narrative on file    Family History  Problem Relation Age of Onset  . Stroke Mother   . Heart disease Mother     CHF  - Amputation-Right Leg  . Hyperlipidemia Mother   . Hypertension Mother   . Lung cancer Father   . Cancer Father     Lung    ROS- All systems are reviewed and negative except as per the HPI above  Physical Exam: Filed Vitals:   02/25/14 1423  BP: 112/86  Pulse: 54    Height: 4' 10.5" (1.486 m)  Weight: 67.767 kg (149 lb 6.4 oz)    GEN- The patient is well  appearing, alert and oriented x 3 today.   Head- normocephalic, atraumatic Eyes-  Sclera clear, conjunctiva pink Ears- hearing intact Oropharynx- clear Neck- supple, no JVP Lymph- no cervical lymphadenopathy Lungs- Clear to ausculation bilaterally, normal work of breathing Heart- Slow, regular rate and rhythm, no murmurs, rubs or gallops, PMI not laterally displaced GI- soft, NT, ND, + BS Extremities- no clubbing, cyanosis, or edema MS- no significant deformity or atrophy Skin- no rash or lesion Psych- euthymic mood, full affect Neuro- strength and sensation are intact  EKG-sinus brady . Review of ecardio worn 6/23 thru 7/6 revealed no evidence of afrb, rate controlled.   Assessment and Plan:  1. Persisent atrial fibrillation Continue amiodarone   200 mg one time a day. Currently patient feels much better in normal sinus rhythm.  She is not presently interested in ablation.  If she has additional afib or does not tolerate her amiodarone then she may be more willing to proceed. Liver panel,tsh,free T4. Ordered. Stop metoprolol today due to fatigue.  2. Chads2vasc score of at least 3. Creatinine clearance based on labs in May and current weight reveals Crcl at 39.2 Reduce Xarelto to 15 mg mg daily to minimize side effects. Pt aware to stop 20 mg dose and start new dose. Repeat Bmet  3. Possible sleep apnea Sleep study is pending.  Pt to follow in the AF clinic with Dr Lovena Le and Roderic Palau NP

## 2014-02-25 NOTE — Patient Instructions (Addendum)
Your physician has recommended you make the following change in your medication:  STOP Metoprolol   Your physician wants you to follow-up in: 6 months with [facility] Roderic Palau, NP).  You will receive a reminder letter in the mail two months in advance. If you don't receive a letter, please call our office to schedule the follow-up appointment.

## 2014-02-26 ENCOUNTER — Other Ambulatory Visit: Payer: Self-pay | Admitting: *Deleted

## 2014-02-26 DIAGNOSIS — I4891 Unspecified atrial fibrillation: Secondary | ICD-10-CM

## 2014-02-26 DIAGNOSIS — R001 Bradycardia, unspecified: Secondary | ICD-10-CM

## 2014-03-04 ENCOUNTER — Encounter: Payer: Self-pay | Admitting: Internal Medicine

## 2014-03-06 ENCOUNTER — Telehealth: Payer: Self-pay | Admitting: Internal Medicine

## 2014-03-06 NOTE — Telephone Encounter (Signed)
Her BP 136/116 but she is taking it with a wrist cuff.  She feels fine.  I will have her come in next Tues for BP check  as she will be in town Monday night for sleep study  Patient aware

## 2014-03-06 NOTE — Telephone Encounter (Signed)
°  Patient came in and 9/2 to see Dr Rayann Heman. He stopped BP meds, due to BP running low. Patient called in stating that BP is running high again. Should she go back on medication? Please call and advise.

## 2014-03-09 ENCOUNTER — Ambulatory Visit (HOSPITAL_BASED_OUTPATIENT_CLINIC_OR_DEPARTMENT_OTHER): Payer: Medicare Other | Attending: Internal Medicine

## 2014-03-09 VITALS — Ht <= 58 in | Wt 148.0 lb

## 2014-03-09 DIAGNOSIS — I4891 Unspecified atrial fibrillation: Secondary | ICD-10-CM

## 2014-03-09 DIAGNOSIS — G4733 Obstructive sleep apnea (adult) (pediatric): Secondary | ICD-10-CM | POA: Diagnosis present

## 2014-03-09 DIAGNOSIS — G4710 Hypersomnia, unspecified: Secondary | ICD-10-CM | POA: Diagnosis not present

## 2014-03-09 DIAGNOSIS — R400 Somnolence: Secondary | ICD-10-CM

## 2014-03-10 ENCOUNTER — Ambulatory Visit (INDEPENDENT_AMBULATORY_CARE_PROVIDER_SITE_OTHER): Payer: Medicare Other | Admitting: *Deleted

## 2014-03-10 VITALS — BP 122/70 | HR 72 | Wt 149.8 lb

## 2014-03-10 DIAGNOSIS — I159 Secondary hypertension, unspecified: Secondary | ICD-10-CM

## 2014-03-10 DIAGNOSIS — I158 Other secondary hypertension: Secondary | ICD-10-CM

## 2014-03-10 NOTE — Progress Notes (Signed)
1.) Reason for visit: BP check  2.) Name of MD requesting visit: Dr. Thompson Grayer  H&P: No c/o today.  States she takes BP at home with wrist unit.  Has been running 130-135/70-80.  Had taken at Dr. Wende Neighbors 's office yesterday and was 136/82  HR 70.  BP today was 122/70 HR 72.  Advised to continue to monitor at home preferably with BP cuff on upper arm.  3.) ROS related to problem: Hypertension  4.) Assessment and plan per MD: Will refer to Dr. Rayann Heman for review and recommendations.

## 2014-03-10 NOTE — Patient Instructions (Signed)
Continue on same medications.  Monitor blood pressure with cuff on upper arm.

## 2014-03-12 ENCOUNTER — Ambulatory Visit (INDEPENDENT_AMBULATORY_CARE_PROVIDER_SITE_OTHER): Payer: Medicare Other | Admitting: Internal Medicine

## 2014-03-12 ENCOUNTER — Encounter: Payer: Self-pay | Admitting: Internal Medicine

## 2014-03-12 VITALS — BP 124/84 | HR 72 | Ht <= 58 in | Wt 148.0 lb

## 2014-03-12 DIAGNOSIS — I4891 Unspecified atrial fibrillation: Secondary | ICD-10-CM

## 2014-03-12 DIAGNOSIS — R0609 Other forms of dyspnea: Secondary | ICD-10-CM

## 2014-03-12 DIAGNOSIS — I480 Paroxysmal atrial fibrillation: Secondary | ICD-10-CM

## 2014-03-12 DIAGNOSIS — R0683 Snoring: Secondary | ICD-10-CM

## 2014-03-12 DIAGNOSIS — R0989 Other specified symptoms and signs involving the circulatory and respiratory systems: Secondary | ICD-10-CM

## 2014-03-12 NOTE — Assessment & Plan Note (Signed)
She is maintaining NSR very nicely. She will continue amio 200 mg daily.

## 2014-03-12 NOTE — Progress Notes (Signed)
HPI [name] returns today for followup. She is a pleasant 58 yo woman with a h/o atrial fib with an RVR who underwent DCCV and initiation of fairly high dose amiodarone therapy after failing AA Rx with rhythmol. She appears to have maintained NSR since discharge from the hospital and her dyspnea is improved. She was referred to consider atrial fib ablation but she is not inclined to undergo this procedure. She denies chest pain or sob. No syncope. Allergies  Allergen Reactions  . Flecainide Nausea Only and Other (See Comments)    Faint feeling  . Hydrocodone-Acetaminophen Nausea Only and Other (See Comments)    Severe headache  . Ibuprofen Other (See Comments)    Kidney dysfunction  . Oxycodone Hcl Nausea Only and Other (See Comments)    Headache  . Penicillins Nausea Only and Other (See Comments)    Severe headache     Current Outpatient Prescriptions  Medication Sig Dispense Refill  . acetaminophen (TYLENOL) 500 MG tablet Take 500 mg by mouth as needed for mild pain or headache.       . allopurinol (ZYLOPRIM) 100 MG tablet Take 100 mg by mouth daily.       Marland Kitchen amiodarone (PACERONE) 200 MG tablet Take 200 mg by mouth daily.       . calcium carbonate (OS-CAL) 600 MG TABS Take 600 mg by mouth 2 (two) times daily with a meal.        . cholecalciferol (VITAMIN D) 1000 UNITS tablet Take 1,000 Units by mouth daily.      Marland Kitchen COLCRYS 0.6 MG tablet Take 1 tablet by mouth as needed (gout).       . fenofibrate 160 MG tablet Take 1 tablet by mouth daily.      . furosemide (LASIX) 20 MG tablet Take 20 mg by mouth daily.      Marland Kitchen levothyroxine (SYNTHROID, LEVOTHROID) 112 MCG tablet Take 112 mcg by mouth daily before breakfast.      . loperamide (IMODIUM A-D) 2 MG tablet Take 2 mg by mouth 4 (four) times daily as needed for diarrhea or loose stools.      Marland Kitchen losartan (COZAAR) 25 MG tablet Take 12.5 mg by mouth daily.       . Magnesium 250 MG TABS Take 1 tablet by mouth daily.       . Omega-3  Fatty Acids (FISH OIL PO) Take 360 mg by mouth 2 (two) times daily.      Marland Kitchen omeprazole (PRILOSEC) 20 MG capsule Take 20 mg by mouth 2 (two) times daily before a meal.       . potassium chloride (K-DUR) 10 MEQ tablet Take 10 mEq by mouth daily.       . Probiotic Product (ALIGN PO) Take 1 tablet by mouth daily.       . rivaroxaban (XARELTO) 20 MG TABS tablet Take 20 mg by mouth daily.      . sitaGLIPtin (JANUVIA) 100 MG tablet Take 50 mg by mouth daily.      Marland Kitchen zolpidem (AMBIEN CR) 12.5 MG CR tablet Take 12.5 mg by mouth at bedtime as needed for sleep.       No current facility-administered medications for this visit.     Past Medical History  Diagnosis Date  . Microscopic colitis 9/11    Colonoscopy  . Hemorrhoids   . Diverticulosis   . Schatzki's ring     Last EGD with esophageal dilatation 37F  9/11  .  Congenital heart disease   . Paroxysmal SVT (supraventricular tachycardia)   . IDA (iron deficiency anemia)   . GERD (gastroesophageal reflux disease)   . Hypothyroidism   . Hypertension   . Hyperlipidemia   . Renal insufficiency   . Gout   . PVD (peripheral vascular disease) 09/2004    Right common femoral endarterectomy in April of 2007  . Hyperparathyroidism   . Scoliosis   . Chronic bronchitis   . Varicose veins right leg pain and swelling  . Atrial fibrillation Jan. 2015  . Fall due to stumbling October 08, 2013    Due to shoes  . Diabetes mellitus without complication     Type II    ROS:   All systems reviewed and negative except as noted in the HPI.   Past Surgical History  Procedure Laterality Date  . Asd repair  Age [facility]  . Right common femoral endarterectomy  2007  . Left parathyroidectomy  2007  . Parathyroid adenoma  2007  . Left hemithyroidectomy    . Tonsillectomy    . Hemorrhoid surgery  2010  . Colonoscopy  03/04/2010    anal canal hemorrhoids otherwise normal. TI normal. Bx showed lymphocytic colitis  .  Esophagogastroduodenoscopy  03/04/2010    noncritical appearing Schatzki ring. SB bx negative  . Elas  06-01-11    Right saphenous ELAS   . Cardioversion N/A 11/13/2013    Procedure: CARDIOVERSION;  Surgeon: Herminio Commons, MD;  Location: [facility];  Service: Endoscopy;  Laterality: N/A;     Family History  Problem Relation Age of Onset  . Stroke Mother   . Heart disease Mother     CHF  - Amputation-Right Leg  . Hyperlipidemia Mother   . Hypertension Mother   . Lung cancer Father   . Cancer Father     Lung     History   Social History  . Marital Status: Single    Spouse Name: N/A    Number of Children: N/A  . Years of Education: N/A   Occupational History  . Not on file.   Social History Main Topics  . Smoking status: Never Smoker   . Smokeless tobacco: Never Used  . Alcohol Use: No  . Drug Use: No  . Sexual Activity: No   Other Topics Concern  . Not on file   Social History Narrative  . No narrative on file     BP 124/84  Pulse 72  Ht 4\' 10"  (1.473 m)  Wt 148 lb (67.132 kg)  BMI 30.94 kg/m2  SpO2 97%  Physical Exam:  Well appearing 58 yo woman, NAD HEENT: Unremarkable Neck:  No JVD, no thyromegally Back:  No CVA tenderness Lungs:  Clear with no wheezes HEART:  Regular brady rhythm, no murmurs, no rubs, no clicks Abd:  soft, positive bowel sounds, no organomegally, no rebound, no guarding Ext:  2 plus pulses, no edema, no cyanosis, no clubbing Skin:  No rashes no nodules Neuro:  CN II through XII intact, motor grossly intact    Assess/Plan:

## 2014-03-12 NOTE — Patient Instructions (Addendum)
Your physician wants you to follow-up in: 1 year with Dr. Taylor. You will receive a reminder letter in the mail two months in advance. If you don't receive a letter, please call our office to schedule the follow-up appointment.  Your physician recommends that you continue on your current medications as directed. Please refer to the Current Medication list given to you today.  Thank you for choosing [facility]!   

## 2014-03-12 NOTE — Assessment & Plan Note (Signed)
She is pending sleep study results. Will follow.

## 2014-03-13 DIAGNOSIS — G4710 Hypersomnia, unspecified: Secondary | ICD-10-CM

## 2014-03-13 DIAGNOSIS — G4730 Sleep apnea, unspecified: Secondary | ICD-10-CM

## 2014-03-13 NOTE — Sleep Study (Signed)
   NAME: [name] DATE OF BIRTH:  1955/11/19 MEDICAL RECORD NUMBER [id number]  LOCATION: [facility]  PHYSICIAN: Kathee Delton  DATE OF STUDY: 03/09/2014  SLEEP STUDY TYPE: Nocturnal Polysomnogram               REFERRING PHYSICIAN: Thompson Grayer, MD  INDICATION FOR STUDY: Hypersomnia with sleep apnea  EPWORTH SLEEPINESS SCORE:  8 HEIGHT: 4\' 10"  (147.3 cm)  WEIGHT: 148 lb (67.132 kg)    Body mass index is 30.94 kg/(m^2).  NECK SIZE: 16 in.  MEDICATIONS: Reviewed in the sleep record  SLEEP ARCHITECTURE: The patient had a total sleep time of 176 minutes, with no slow-wave sleep and only 32 minutes of REM. Sleep onset latency was prolonged at 46 minutes, and REM onset was delayed at 146 minutes. Sleep efficiency was poor at 47%.  RESPIRATORY DATA: The patient was found to have no apneas and 21 obstructive hypopneas, giving her an AHI of 7 events per hour. The events occurred in all body positions, but were increased during REM. There was moderate snoring noted throughout. The patient did not meet split-night criteria secondary to her small numbers of events.  OXYGEN DATA: There was oxygen desaturation transiently as low as 84%.  CARDIAC DATA: No clinically significant arrhythmias were noted  MOVEMENT/PARASOMNIA: No significant limb movements or abnormal behaviors were seen.  IMPRESSION/ RECOMMENDATION:    1) very mild obstructive sleep apnea/hypopnea syndrome, with an AHI of 7 events per hour and transient oxygen desaturation as low as 84%. It should be noted the patient had a total sleep time of only 176 minutes, with significantly decreased quantity of REM. Her degree of sleep apnea may be underestimated. Treatment for this degree of sleep apnea can include a trial of weight loss alone, upper airway surgery, dental appliance, and also CPAP. Clinical correlation is suggested.     [place], American Board of Sleep  Medicine  ELECTRONICALLY SIGNED ON:  03/13/2014, 5:54 PM [place] PH: [phone]   FX: [phone] [place]

## 2014-03-26 ENCOUNTER — Telehealth: Payer: Self-pay | Admitting: Internal Medicine

## 2014-03-26 DIAGNOSIS — G4730 Sleep apnea, unspecified: Secondary | ICD-10-CM

## 2014-03-26 NOTE — Telephone Encounter (Signed)
Spoke with patient and let her know the results of her sleep study.  I put in a referral to Pulmonary  She is aware

## 2014-03-26 NOTE — Telephone Encounter (Signed)
New problem   Pt want to know results of sleep test. Please call pt.

## 2014-04-17 ENCOUNTER — Other Ambulatory Visit: Payer: Self-pay | Admitting: Cardiology

## 2014-05-12 ENCOUNTER — Ambulatory Visit (INDEPENDENT_AMBULATORY_CARE_PROVIDER_SITE_OTHER): Payer: Medicare Other | Admitting: Pulmonary Disease

## 2014-05-12 ENCOUNTER — Encounter: Payer: Self-pay | Admitting: Pulmonary Disease

## 2014-05-12 VITALS — BP 124/64 | HR 63 | Temp 97.4°F | Ht 58.5 in | Wt 155.4 lb

## 2014-05-12 DIAGNOSIS — G4733 Obstructive sleep apnea (adult) (pediatric): Secondary | ICD-10-CM

## 2014-05-12 NOTE — Progress Notes (Signed)
Subjective:    Patient ID: [name], female    DOB: 1955/11/04, 58 y.o.   MRN: [id number]  HPI The patient is a 58 year old female who I've been asked to see for management of obstructive sleep apnea. She has had a recent sleep study which revealed an AHI of 7 events per hour. The patient has a history of loud snoring, but sleeps alone with no one to monitor her for witnessed apneas. She does have an occasional choking and snoring arousal. She has frequent awakenings at night, and is not rested in the mornings upon arising. She does note some sleep pressure during the day while at work, but this is intermittent. She can also fall asleep easily in the evenings while trying to watch television or movies. She denies any sleepiness with driving. The patient states that she has gained about 12 pounds over the last 2 years, and her Epworth score today is only 4.   Sleep Questionnaire What time do you typically go to bed?( Between what hours) 10-11PM 10-11PM at 1536 on 05/12/14 by Inge Rise, CMA How long does it take you to fall asleep? 30 min 30 min at 1536 on 05/12/14 by Inge Rise, CMA How many times during the night do you wake up? 4 4 at 1536 on 05/12/14 by Inge Rise, [place] What time do you get out of bed to start your day? 0700 0700 at 1536 on 05/12/14 by Inge Rise, CMA Do you drive or operate heavy machinery in your occupation? No No at 1536 on 05/12/14 by Inge Rise, CMA How much has your weight changed (up or down) over the past two years? (In pounds) 12 lb (5.443 kg) 12 lb (5.443 kg) at 1536 on 05/12/14 by Inge Rise, CMA Have you ever had a sleep study before? Yes Yes at 1536 on 05/12/14 by Inge Rise, CMA If yes, [facility] at 1536 on 05/12/14 by Inge Rise, CMA If yes, date of study? 03/09/14 03/09/14 at 1536 on 05/12/14 by Inge Rise, CMA Do you currently use CPAP? No No at 1536 on 05/12/14 by Inge Rise, CMA Do you  wear oxygen at any time? No No at 1536 on 05/12/14 by Inge Rise, CMA   Review of Systems  Constitutional: Negative for fever and unexpected weight change.  HENT: Negative for congestion, dental problem, ear pain, nosebleeds, postnasal drip, rhinorrhea, sinus pressure, sneezing, sore throat and trouble swallowing.   Eyes: Negative for redness and itching.  Respiratory: Positive for shortness of breath. Negative for cough, chest tightness and wheezing.   Cardiovascular: Positive for palpitations. Negative for leg swelling.  Gastrointestinal: Negative for nausea and vomiting.  Genitourinary: Negative for dysuria.  Musculoskeletal: Negative for joint swelling.  Skin: Negative for rash.  Neurological: Negative for headaches.  Hematological: Does not bruise/bleed easily.  Psychiatric/Behavioral: Negative for dysphoric mood. The patient is not nervous/anxious.        Objective:   Physical Exam Constitutional:  Overweight female, no acute distress  HENT:  Nares patent without discharge, large turbinates, very narrow bilat  Oropharynx without exudate, palate and uvula are thick and elongated.  Eyes:  Perrla, eomi, no scleral icterus  Neck:  No JVD, no TMG  Cardiovascular:  Normal rate, regular rhythm, no rubs or gallops.  No murmurs        Intact distal pulses  Pulmonary :  Normal breath sounds, no stridor or respiratory distress  No rales, rhonchi, or wheezing  Abdominal:  Soft, nondistended, bowel sounds present.  No tenderness noted.   Musculoskeletal:  mild lower extremity edema noted.  Lymph Nodes:  No cervical lymphadenopathy noted  Skin:  No cyanosis noted  Neurologic:  Alert, appropriate, moves all 4 extremities without obvious deficit.         Assessment & Plan:

## 2014-05-12 NOTE — Assessment & Plan Note (Signed)
The patient has mild obstructive sleep apnea by her recent study, but she is significantly symptomatic during the night, and also sleepiness during the day while at work. I have reviewed the pathophysiology of sleep apnea with her, including its potential impact to her cardiovascular health. I have reviewed the various treatment options for mild sleep apnea, including a trial of weight loss alone, upper airway surgery, dental appliance, and also C Pap. After a long discussion, but the patient and I feel a trial of C Pap is in her best interest initially.  I will set the patient up on cpap at a moderate pressure level to allow for desensitization, and will troubleshoot the device over the next 4-6weeks if needed.  The pt is to call me if having issues with tolerance.  Will then optimize the pressure once patient is able to wear cpap on a consistent basis.

## 2014-05-12 NOTE — Patient Instructions (Signed)
Will start on cpap at a moderate pressure level.  Please call if you are having issues with tolerance. Work on weight loss followup with me again in 8 weeks.  

## 2014-05-13 ENCOUNTER — Telehealth: Payer: Self-pay | Admitting: Internal Medicine

## 2014-05-13 NOTE — Telephone Encounter (Signed)
New Message        Calling wanting to know if pt was referred by Dr.Allred to have a sleep study. Please call back and advise.

## 2014-05-13 NOTE — Telephone Encounter (Signed)
Left message for Mariann Laster to return my call

## 2014-05-19 NOTE — Telephone Encounter (Signed)
Left another message to return call if still needed information

## 2014-07-07 ENCOUNTER — Other Ambulatory Visit: Payer: Self-pay | Admitting: Gastroenterology

## 2014-07-07 ENCOUNTER — Ambulatory Visit: Payer: Medicare Other | Admitting: Pulmonary Disease

## 2014-08-28 ENCOUNTER — Ambulatory Visit (INDEPENDENT_AMBULATORY_CARE_PROVIDER_SITE_OTHER): Payer: Medicare Other | Admitting: Nurse Practitioner

## 2014-08-28 ENCOUNTER — Telehealth: Payer: Self-pay | Admitting: Pulmonary Disease

## 2014-08-28 ENCOUNTER — Encounter: Payer: Self-pay | Admitting: Nurse Practitioner

## 2014-08-28 VITALS — BP 118/62 | HR 75 | Ht 58.5 in | Wt 154.8 lb

## 2014-08-28 DIAGNOSIS — G4733 Obstructive sleep apnea (adult) (pediatric): Secondary | ICD-10-CM

## 2014-08-28 DIAGNOSIS — I4891 Unspecified atrial fibrillation: Secondary | ICD-10-CM

## 2014-08-28 DIAGNOSIS — I480 Paroxysmal atrial fibrillation: Secondary | ICD-10-CM

## 2014-08-28 MED ORDER — FUROSEMIDE 20 MG PO TABS: 20.0000 mg | ORAL_TABLET | Freq: Every day | ORAL | Status: DC

## 2014-08-28 NOTE — Patient Instructions (Signed)
Your physician wants you to follow-up in: 6 months with Dr Taylor You will receive a reminder letter in the mail two months in advance. If you don't receive a letter, please call our office to schedule the follow-up appointment.  

## 2014-08-28 NOTE — Progress Notes (Signed)
Electrophysiologist: Dr. Lovena Le Cardiologist: Dr. Harl Bowie PCP: Merlyn Albert, MD    HPI [name] returns today for followup. She is a pleasant 59 yo woman with a h/o atrial fib with an RVR who underwent DCCV and initiation of fairly high dose amiodarone therapy after failing AA Rx with rhythmol. She appears to have maintained NSR since discharge from the hospital and her dyspnea is improved. She was referred to consider atrial fib ablation but she is not inclined to undergo this procedure. She denies chest pain or sob. No syncope. No irregular rhythm.  Has been dx with OSA and has just been using Cpap x one week and feels like pressure may be too high. Averaging about 2-3 hrs use a night trying to adjust to it. Just had labs drawn with PCP with liver and thyroid checked.Continues with RI but on appropriate dose of xarelto at 15 mg with Crcl at 39.5 mL/min.  Allergies  Allergen Reactions  . Flecainide Nausea Only and Other (See Comments)    Faint feeling  . Hydrocodone-Acetaminophen Nausea Only and Other (See Comments)    Severe headache  . Ibuprofen Other (See Comments)    Kidney dysfunction  . Oxycodone Hcl Nausea Only and Other (See Comments)    Headache  . Penicillins Nausea Only and Other (See Comments)    Severe headache     Current Outpatient Prescriptions  Medication Sig Dispense Refill  . acetaminophen (TYLENOL) 500 MG tablet Take 500 mg by mouth as needed for mild pain or headache.     . allopurinol (ZYLOPRIM) 100 MG tablet Take 100 mg by mouth 2 (two) times daily.     Marland Kitchen amiodarone (PACERONE) 200 MG tablet Take 200 mg by mouth daily.     . calcium carbonate (OS-CAL) 600 MG TABS Take 600 mg by mouth 2 (two) times daily with a meal.      . cholecalciferol (VITAMIN D) 1000 UNITS tablet Take 1,000 Units by mouth daily.    Marland Kitchen COLCRYS 0.6 MG tablet Take 1 tablet by mouth as needed (gout).     . fenofibrate 160 MG tablet Take 1 tablet by mouth daily.    . furosemide (LASIX)  20 MG tablet Take 1 tablet (20 mg total) by mouth daily. 30 tablet 6  . levothyroxine (SYNTHROID, LEVOTHROID) 112 MCG tablet Take 112 mcg by mouth daily before breakfast.    . loperamide (IMODIUM A-D) 2 MG tablet Take 2 mg by mouth 4 (four) times daily as needed for diarrhea or loose stools.    Marland Kitchen losartan (COZAAR) 25 MG tablet Take 12.5 mg by mouth daily.     . Magnesium 250 MG TABS Take 1 tablet by mouth daily.     . Omega-3 Fatty Acids (FISH OIL PO) Take 360 mg by mouth 2 (two) times daily.    Marland Kitchen omeprazole (PRILOSEC) 20 MG capsule TAKE ONE CAPSULE BY MOUTH TWICE DAILY ONE HOUR BEFORE FOOD IF POSSIBLE. 30 capsule 5  . potassium chloride (K-DUR) 10 MEQ tablet Take 10 mEq by mouth daily.     . Probiotic Product (ALIGN PO) Take 1 tablet by mouth daily.     . Rivaroxaban (XARELTO) 15 MG TABS tablet Take 15 mg by mouth daily with supper.    . sitaGLIPtin (JANUVIA) 100 MG tablet Take 50 mg by mouth daily.    Marland Kitchen zolpidem (AMBIEN CR) 12.5 MG CR tablet Take 12.5 mg by mouth at bedtime as needed for sleep.     No  current facility-administered medications for this visit.     Past Medical History  Diagnosis Date  . Microscopic colitis 9/11    Colonoscopy  . Hemorrhoids   . Diverticulosis   . Schatzki's ring     Last EGD with esophageal dilatation 38F  9/11  . Congenital heart disease   . Paroxysmal SVT (supraventricular tachycardia)   . IDA (iron deficiency anemia)   . GERD (gastroesophageal reflux disease)   . Hypothyroidism   . Hypertension   . Hyperlipidemia   . Renal insufficiency   . Gout   . PVD (peripheral vascular disease) 09/2004    Right common femoral endarterectomy in April of 2007  . Hyperparathyroidism   . Scoliosis   . Chronic bronchitis   . Varicose veins right leg pain and swelling  . Atrial fibrillation Jan. 2015  . Fall due to stumbling October 08, 2013    Due to shoes  . Diabetes mellitus without complication     Type II    ROS:   All systems reviewed and  negative except as noted in the HPI.   Past Surgical History  Procedure Laterality Date  . Asd repair  Age [facility]  . Right common femoral endarterectomy  2007  . Left parathyroidectomy  2007  . Parathyroid adenoma  2007  . Left hemithyroidectomy    . Tonsillectomy    . Hemorrhoid surgery  2010  . Colonoscopy  03/04/2010    anal canal hemorrhoids otherwise normal. TI normal. Bx showed lymphocytic colitis  . Esophagogastroduodenoscopy  03/04/2010    noncritical appearing Schatzki ring. SB bx negative  . Elas  06-01-11    Right saphenous ELAS   . Cardioversion N/A 11/13/2013    Procedure: CARDIOVERSION;  Surgeon: Herminio Commons, MD;  Location: [facility];  Service: Endoscopy;  Laterality: N/A;     Family History  Problem Relation Age of Onset  . Stroke Mother   . Heart disease Mother     CHF  - Amputation-Right Leg  . Hyperlipidemia Mother   . Hypertension Mother   . Lung cancer Father   . Cancer Father     Lung     History   Social History  . Marital Status: Single    Spouse Name: N/A  . Number of Children: 0  . Years of Education: N/A   Occupational History  . office worker    Social History Main Topics  . Smoking status: Never Smoker   . Smokeless tobacco: Never Used  . Alcohol Use: No  . Drug Use: No  . Sexual Activity: No   Other Topics Concern  . Not on file   Social History Narrative     BP 118/62 mmHg  Pulse 75  Ht 4' 10.5" (1.486 m)  Wt 154 lb 12.8 oz (70.217 kg)  BMI 31.80 kg/m2  EKG- NSR at 75 bpm, QTc 469 ms. Labs: 2/26 TSH 5.514, free t4 1.77 Liver panel- bili- 0.3 AST- 22 ALT 23 Total protein -6.8 Albumin-4.0 Alk phos-29  Physical Exam:  Well appearing 59 yo woman, NAD HEENT: Unremarkable Neck:  No JVD, no thyromegally Back:  No CVA tenderness Lungs:  Clear with no wheezes HEART:  Regular brady rhythm, no murmurs, no rubs, no clicks Abd:  soft, positive bowel sounds, no organomegally, no  rebound, no guarding Ext:  2 plus pulses, no edema, no cyanosis, no clubbing Skin:  No rashes no nodules Neuro:  CN II  through XII intact, motor grossly intact    Assess/Plan:  1. PAF- maintaining SR  Continue amiodarone Continue xarelto at appropriate dose of 15 mg for h/o RI, chads2vasc of at least  3  2. OSA F/u with Dr. Gwenette Greet re issues with pressure and mask tolerance.  3. HTN Well managed  F/u with Dr. Lovena Le as scheduled in September.

## 2014-08-28 NOTE — Telephone Encounter (Signed)
Left message on voicemail to return call.

## 2014-08-31 NOTE — Telephone Encounter (Signed)
[id number], pt cb

## 2014-08-31 NOTE — Telephone Encounter (Signed)
Called spoke with pt. She feels her CPAP pressure is too strong. She reports at times it blows her mask off. Download printed and placed in [facility] look at for review. Pt aware he does not return until tomorrow. Please advise thanks

## 2014-08-31 NOTE — Telephone Encounter (Signed)
LMTCB x2  

## 2014-09-01 NOTE — Telephone Encounter (Signed)
Let her know the download shows that her pressure is in the mild to moderate range only.  She is not going any higher than 11. We can limit her pressure some and see how she does, but may have some breakthru apnea Go ahead and send order to change pressure to auto 5-9cm. Let pt know to call us if still having issues

## 2014-09-01 NOTE — Telephone Encounter (Signed)
Pt aware of rec's per [place].  Order placed for DME to change CPAP pressure. Nothing further needed.

## 2014-09-01 NOTE — Telephone Encounter (Signed)
LMTCB x 1 

## 2014-10-14 ENCOUNTER — Telehealth: Payer: Self-pay | Admitting: Pulmonary Disease

## 2014-10-14 NOTE — Telephone Encounter (Signed)
Will route message to [facility] to let him know.

## 2014-10-14 NOTE — Telephone Encounter (Signed)
The pt has sleep apnea, and meets all criteria for treatment.  If they are not willing to do it, then she needs to be referred to a different dme.

## 2014-10-14 NOTE — Telephone Encounter (Signed)
Left message for Edwena Blow at [facility] to call back.

## 2014-10-15 NOTE — Telephone Encounter (Signed)
lmtcb for [name].

## 2014-10-16 ENCOUNTER — Encounter: Payer: Self-pay | Admitting: Pulmonary Disease

## 2014-10-16 ENCOUNTER — Encounter (INDEPENDENT_AMBULATORY_CARE_PROVIDER_SITE_OTHER): Payer: Self-pay

## 2014-10-16 ENCOUNTER — Ambulatory Visit (INDEPENDENT_AMBULATORY_CARE_PROVIDER_SITE_OTHER): Payer: Medicare Other | Admitting: Pulmonary Disease

## 2014-10-16 VITALS — BP 140/80 | HR 64 | Temp 97.0°F | Ht 58.5 in | Wt 160.4 lb

## 2014-10-16 DIAGNOSIS — G4733 Obstructive sleep apnea (adult) (pediatric): Secondary | ICD-10-CM | POA: Diagnosis not present

## 2014-10-16 NOTE — Patient Instructions (Signed)
Work on [employer] loss Think about a dental appliance for your sleep apnea if you continue to have symptoms.  Please call us and we can arrange the referral

## 2014-10-16 NOTE — Assessment & Plan Note (Signed)
The patient has a history of mild obstructive sleep apnea, and has been intolerant of sleep apnea. I have talked with her about weight loss as well as a dental appliance as alternative treatments for her sleep apnea. She would like to work on weight reduction for now, and will let me know if she would like referral to dental medicine

## 2014-10-16 NOTE — Progress Notes (Signed)
   Subjective:    Patient ID: [name], female    DOB: 09/19/1955, 59 y.o.   MRN: [id number]  HPI The patient comes in today for follow-up of her obstructive sleep apnea. She has mild disease, but definite symptoms, and was therefore started on C Pap. She has been completely intolerant of the device, and feels that it is not an acceptable treatment for her. She has turned her C Pap device back into her home care company.     Review of Systems  Constitutional: Negative for fever and unexpected weight change.  HENT: Negative for congestion, dental problem, ear pain, nosebleeds, postnasal drip, rhinorrhea, sinus pressure, sneezing, sore throat and trouble swallowing.   Eyes: Negative for redness and itching.  Respiratory: Negative for cough, chest tightness, shortness of breath and wheezing.   Cardiovascular: Negative for palpitations and leg swelling.  Gastrointestinal: Negative for nausea and vomiting.  Genitourinary: Negative for dysuria.  Musculoskeletal: Negative for joint swelling.  Skin: Negative for rash.  Neurological: Negative for headaches.  Hematological: Does not bruise/bleed easily.  Psychiatric/Behavioral: Negative for dysphoric mood. The patient is not nervous/anxious.        Objective:   Physical Exam Overweight female in no acute distress Nose without purulence or discharge noted Neck is thick but no thyromegaly noted Lower extremities with mild edema, no cyanosis Alert and oriented, moves all 4 extremities.       Assessment & Plan:

## 2014-10-16 NOTE — Telephone Encounter (Signed)
lmtcb for [name].

## 2014-10-19 NOTE — Telephone Encounter (Signed)
Lm for [facility]

## 2014-10-20 NOTE — Telephone Encounter (Signed)
So what can she do to get the cpap back?

## 2014-10-20 NOTE — Telephone Encounter (Signed)
Called and spoke to [place] with [employer]. The issue is the pt is not compliant with her CPAP. Pt has only used CPAP 47%, when 70% compliance is needed for coverage. Edwena Blow stated the pt has now been denied for coverage d/t lack of CPAP compliance. A denial letter has been mailed to the office and should be receiving soon if not already. Edwena Blow stated either we can file for an appeal or the pt can.   Spray please advise. Thanks.

## 2014-10-20 NOTE — Telephone Encounter (Signed)
Called [name] and LMTCB x1

## 2014-10-21 NOTE — Telephone Encounter (Signed)
lmtcb x2 for [employer] at [facility]

## 2014-10-22 NOTE — Telephone Encounter (Signed)
lmtcb x 3 for [employer]

## 2015-01-28 ENCOUNTER — Other Ambulatory Visit: Payer: Self-pay | Admitting: Nurse Practitioner

## 2015-02-25 ENCOUNTER — Encounter: Payer: Self-pay | Admitting: Internal Medicine

## 2015-02-25 ENCOUNTER — Ambulatory Visit (INDEPENDENT_AMBULATORY_CARE_PROVIDER_SITE_OTHER): Payer: Medicare Other | Admitting: Internal Medicine

## 2015-02-25 VITALS — BP 122/84 | HR 70 | Ht <= 58 in | Wt 157.0 lb

## 2015-02-25 DIAGNOSIS — I480 Paroxysmal atrial fibrillation: Secondary | ICD-10-CM

## 2015-02-25 DIAGNOSIS — I5031 Acute diastolic (congestive) heart failure: Secondary | ICD-10-CM | POA: Diagnosis not present

## 2015-02-25 MED ORDER — AMIODARONE HCL 200 MG PO TABS: ORAL_TABLET | ORAL | Status: DC

## 2015-02-25 NOTE — Progress Notes (Signed)
HPI [name] returns today for followup. She is a pleasant 59 yo woman with a h/o atrial fib with an RVR who underwent DCCV and initiation of fairly high dose amiodarone therapy after failing AA Rx with rhythmol. She appears to have maintained NSR. She was referred to consider atrial fib ablation but she is not inclined to undergo this procedure. She denies chest pain or sob. No syncope. Her arms feel heavy at times.  Allergies  Allergen Reactions  . Flecainide Nausea Only and Other (See Comments)    Faint feeling  . Hydrocodone-Acetaminophen Nausea Only and Other (See Comments)    Severe headache  . Ibuprofen Other (See Comments)    Kidney dysfunction  . Oxycodone Hcl Nausea Only and Other (See Comments)    Headache  . Penicillins Nausea Only and Other (See Comments)    Severe headache     Current Outpatient Prescriptions  Medication Sig Dispense Refill  . acetaminophen (TYLENOL) 500 MG tablet Take 500 mg by mouth as needed for mild pain or headache.     . allopurinol (ZYLOPRIM) 100 MG tablet Take 100 mg by mouth 2 (two) times daily.     Marland Kitchen amiodarone (PACERONE) 200 MG tablet Take 200 mg by mouth daily.     . calcium carbonate (OS-CAL) 600 MG TABS Take 600 mg by mouth 2 (two) times daily with a meal.      . cholecalciferol (VITAMIN D) 1000 UNITS tablet Take 1,000 Units by mouth daily.    Marland Kitchen COLCRYS 0.6 MG tablet Take 1 tablet by mouth as needed (gout).     . fenofibrate 160 MG tablet Take 1 tablet by mouth daily.    . furosemide (LASIX) 20 MG tablet Take 1 tablet (20 mg total) by mouth daily. 30 tablet 6  . levothyroxine (SYNTHROID, LEVOTHROID) 112 MCG tablet Take 112 mcg by mouth daily before breakfast.    . loperamide (IMODIUM A-D) 2 MG tablet Take 2 mg by mouth 4 (four) times daily as needed for diarrhea or loose stools.    Marland Kitchen losartan (COZAAR) 25 MG tablet Take 12.5 mg by mouth daily.     . Magnesium 250 MG TABS Take 1 tablet by mouth daily.     . Omega-3 Fatty Acids  (FISH OIL PO) Take 360 mg by mouth 2 (two) times daily.    Marland Kitchen omeprazole (PRILOSEC) 20 MG capsule TAKE ONE CAPSULE BY MOUTH TWICE DAILY ONE HOUR BEFORE FOOD IF POSSIBLE. 60 capsule 5  . potassium chloride (K-DUR) 10 MEQ tablet Take 10 mEq by mouth daily.     . Probiotic Product (ALIGN PO) Take 1 tablet by mouth daily.     . Rivaroxaban (XARELTO) 15 MG TABS tablet Take 15 mg by mouth daily with supper.    . sitaGLIPtin (JANUVIA) 100 MG tablet Take 50 mg by mouth daily.    Marland Kitchen zolpidem (AMBIEN CR) 12.5 MG CR tablet Take 12.5 mg by mouth at bedtime as needed for sleep.     No current facility-administered medications for this visit.     Past Medical History  Diagnosis Date  . Microscopic colitis 9/11    Colonoscopy  . Hemorrhoids   . Diverticulosis   . Schatzki's ring     Last EGD with esophageal dilatation 80F  9/11  . Congenital heart disease   . Paroxysmal SVT (supraventricular tachycardia)   . IDA (iron deficiency anemia)   . GERD (gastroesophageal reflux disease)   . Hypothyroidism   .  Hypertension   . Hyperlipidemia   . Renal insufficiency   . Gout   . PVD (peripheral vascular disease) 09/2004    Right common femoral endarterectomy in April of 2007  . Hyperparathyroidism   . Scoliosis   . Chronic bronchitis   . Varicose veins right leg pain and swelling  . Atrial fibrillation Jan. 2015  . Fall due to stumbling October 08, 2013    Due to shoes  . Diabetes mellitus without complication     Type II    ROS:   All systems reviewed and negative except as noted in the HPI.   Past Surgical History  Procedure Laterality Date  . Asd repair  [facility]  . Right common femoral endarterectomy  2007  . Left parathyroidectomy  2007  . Parathyroid adenoma  2007  . Left hemithyroidectomy    . Tonsillectomy    . Hemorrhoid surgery  2010  . Colonoscopy  03/04/2010    anal canal hemorrhoids otherwise normal. TI normal. Bx showed lymphocytic  colitis  . Esophagogastroduodenoscopy  03/04/2010    noncritical appearing Schatzki ring. SB bx negative  . Elas  06-01-11    Right saphenous ELAS   . Cardioversion N/A 11/13/2013    Procedure: CARDIOVERSION;  Surgeon: Herminio Commons, MD;  Location: [facility];  Service: Endoscopy;  Laterality: N/A;     Family History  Problem Relation Age of Onset  . Stroke Mother   . Heart disease Mother     CHF  - Amputation-Right Leg  . Hyperlipidemia Mother   . Hypertension Mother   . Lung cancer Father   . Cancer Father     Lung     Social History   Social History  . Marital Status: Single    Spouse Name: N/A  . Number of Children: 0  . Years of Education: N/A   Occupational History  . office worker    Social History Main Topics  . Smoking status: Never Smoker   . Smokeless tobacco: Never Used  . Alcohol Use: No  . Drug Use: No  . Sexual Activity: No   Other Topics Concern  . Not on file   Social History Narrative     BP 122/84 mmHg  Pulse 70  Ht 4\' 10"  (1.473 m)  Wt 157 lb (71.215 kg)  BMI 32.82 kg/m2  SpO2 98%  Physical Exam:  Well appearing 59 yo woman, NAD HEENT: Unremarkable Neck:  6 cm JVD, no thyromegally Back:  No CVA tenderness Lungs:  Clear with no wheezes HEART:  Regular brady rhythm, no murmurs, no rubs, no clicks Abd:  soft, positive bowel sounds, no organomegally, no rebound, no guarding Ext:  2 plus pulses, no edema, no cyanosis, no clubbing Skin:  No rashes no nodules Neuro:  CN II through XII intact, motor grossly intact  EKG - nsr  Assess/Plan:

## 2015-02-25 NOTE — Assessment & Plan Note (Signed)
Her symptoms are class 2A. She will continue her current meds.

## 2015-02-25 NOTE — Assessment & Plan Note (Signed)
She is maintaining NSR. She will continue her current meds and reduce her amio to 200 mg Mon-Fri and 100 mg Sat.and Sunday.

## 2015-02-25 NOTE — Patient Instructions (Signed)
Your physician wants you to follow-up in: [place].  You will receive a reminder letter in the mail two months in advance. If you don't receive a letter, please call our office to schedule the follow-up appointment.  Your physician has recommended you make the following change in your medication:   DECREASE PACERONE (AMIODARONE) TO 1 TABLET MON -FRI AND 1/2 ON SAT AND SUN.   Thank you for choosing [facility]!

## 2015-03-09 ENCOUNTER — Other Ambulatory Visit (HOSPITAL_COMMUNITY): Payer: Self-pay | Admitting: Internal Medicine

## 2015-03-09 ENCOUNTER — Ambulatory Visit (HOSPITAL_COMMUNITY)
Admission: RE | Admit: 2015-03-09 | Discharge: 2015-03-09 | Disposition: A | Discharge status: Home / Self Care | Payer: Medicare Other | Source: Ambulatory Visit | Attending: Internal Medicine | Admitting: Internal Medicine

## 2015-03-09 DIAGNOSIS — R609 Edema, unspecified: Secondary | ICD-10-CM

## 2015-03-09 DIAGNOSIS — W19XXXA Unspecified fall, initial encounter: Secondary | ICD-10-CM | POA: Diagnosis not present

## 2015-03-09 DIAGNOSIS — M11261 Other chondrocalcinosis, right knee: Secondary | ICD-10-CM | POA: Diagnosis not present

## 2015-03-09 DIAGNOSIS — M25561 Pain in right knee: Secondary | ICD-10-CM | POA: Insufficient documentation

## 2015-03-09 DIAGNOSIS — S8001XA Contusion of right knee, initial encounter: Secondary | ICD-10-CM | POA: Insufficient documentation

## 2015-03-14 ENCOUNTER — Other Ambulatory Visit: Payer: Self-pay | Admitting: Internal Medicine

## 2015-03-26 ENCOUNTER — Encounter: Payer: Self-pay | Admitting: Gastroenterology

## 2015-03-26 ENCOUNTER — Ambulatory Visit (INDEPENDENT_AMBULATORY_CARE_PROVIDER_SITE_OTHER): Payer: Medicare Other | Admitting: Gastroenterology

## 2015-03-26 ENCOUNTER — Other Ambulatory Visit: Payer: Self-pay

## 2015-03-26 VITALS — BP 122/78 | HR 69 | Temp 96.8°F | Ht <= 58 in | Wt 156.6 lb

## 2015-03-26 DIAGNOSIS — K219 Gastro-esophageal reflux disease without esophagitis: Secondary | ICD-10-CM

## 2015-03-26 DIAGNOSIS — R1314 Dysphagia, pharyngoesophageal phase: Secondary | ICD-10-CM

## 2015-03-26 DIAGNOSIS — R1310 Dysphagia, unspecified: Secondary | ICD-10-CM | POA: Insufficient documentation

## 2015-03-26 DIAGNOSIS — R1319 Other dysphagia: Secondary | ICD-10-CM

## 2015-03-26 NOTE — Progress Notes (Signed)
Primary Care Physician:  Wende Neighbors, MD  Primary Gastroenterologist:  Garfield Cornea, MD   Chief Complaint  Patient presents with  . Dysphagia    HPI:  [name] is a 59 y.o. female here for follow-up of dysphagia. She was last seen October 2014. She has a history of lymphocytic colitis, GERD, Schatzki ring. Previous negative celiac antibody panel, negative small bowel biopsy, normal GES.  Having trouble with swallowing pills, starts to gag and hangs in upper throat/esophagus area. Last two weeks much worse. Having sore throat for over a month. Night time wake up choking/strangling. Having trouble with steak too. Ok with liquids. Still some heartburn on omeprazole twice a day. + regurgitation. Recent abx but was having problems before. BM regular. Uses miralax sometimes and it helps. No melena, brbpr. No weight loss.   Xarelto at night.   Current Outpatient Prescriptions  Medication Sig Dispense Refill  . acetaminophen (TYLENOL) 500 MG tablet Take 500 mg by mouth as needed for mild pain or headache.     . allopurinol (ZYLOPRIM) 100 MG tablet Take 100 mg by mouth 2 (two) times daily.     Marland Kitchen amiodarone (PACERONE) 200 MG tablet TAKE 1 TABLET MON - FRI , TAKE 1/2 TABLET SAT- SUN 90 tablet 3  . calcium carbonate (OS-CAL) 600 MG TABS Take 600 mg by mouth 2 (two) times daily with a meal.      . cholecalciferol (VITAMIN D) 1000 UNITS tablet Take 1,000 Units by mouth daily.    Marland Kitchen COLCRYS 0.6 MG tablet Take 1 tablet by mouth as needed (gout).     . fenofibrate 160 MG tablet Take 1 tablet by mouth daily.    . furosemide (LASIX) 20 MG tablet Take 1 tablet (20 mg total) by mouth daily. 30 tablet 6  . levothyroxine (SYNTHROID, LEVOTHROID) 112 MCG tablet Take 112 mcg by mouth daily before breakfast.    . losartan (COZAAR) 25 MG tablet Take 12.5 mg by mouth daily.     . Magnesium 250 MG TABS Take 1 tablet by mouth daily.     . Omega-3 Fatty Acids (FISH OIL PO) Take 360 mg by mouth 2 (two) times daily.     Marland Kitchen omeprazole (PRILOSEC) 20 MG capsule TAKE ONE CAPSULE BY MOUTH TWICE DAILY ONE HOUR BEFORE FOOD IF POSSIBLE. 60 capsule 5  . potassium chloride (K-DUR) 10 MEQ tablet Take 10 mEq by mouth daily.     . Probiotic Product (ALIGN PO) Take 1 tablet by mouth daily.     . sitaGLIPtin (JANUVIA) 100 MG tablet Take 50 mg by mouth daily.    Alveda Reasons 15 MG TABS tablet TAKE ONE TABLET BY MOUTH DAILY. 30 tablet 3  . zolpidem (AMBIEN CR) 12.5 MG CR tablet Take 12.5 mg by mouth at bedtime as needed for sleep.     No current facility-administered medications for this visit.    Allergies as of 03/26/2015 - Review Complete 03/26/2015  Allergen Reaction Noted  . Flecainide Nausea Only and Other (See Comments) 11/07/2013  . Hydrocodone-acetaminophen Nausea Only and Other (See Comments)   . Ibuprofen Other (See Comments) 09/05/2011  . Oxycodone hcl Nausea Only and Other (See Comments)   . Penicillins Nausea Only and Other (See Comments)     Past Medical History  Diagnosis Date  . Microscopic colitis 9/11    Colonoscopy  . Hemorrhoids   . Diverticulosis   . Schatzki's ring     Last EGD with esophageal dilatation 32F  9/11  .  Congenital heart disease   . Paroxysmal SVT (supraventricular tachycardia)   . IDA (iron deficiency anemia)   . GERD (gastroesophageal reflux disease)   . Hypothyroidism   . Hypertension   . Hyperlipidemia   . Renal insufficiency   . Gout   . PVD (peripheral vascular disease) 09/2004    Right common femoral endarterectomy in April of 2007  . Hyperparathyroidism   . Scoliosis   . Chronic bronchitis   . Varicose veins right leg pain and swelling  . Atrial fibrillation Jan. 2015  . Fall due to stumbling October 08, 2013    Due to shoes  . Diabetes mellitus without complication     Type II    Past Surgical History  Procedure Laterality Date  . Asd repair  Age [facility]  . Right common femoral endarterectomy  2007  . Left  parathyroidectomy  2007  . Parathyroid adenoma  2007  . Left hemithyroidectomy    . Tonsillectomy    . Hemorrhoid surgery  2010  . Colonoscopy  03/04/2010    anal canal hemorrhoids otherwise normal. TI normal. Bx showed lymphocytic colitis  . Esophagogastroduodenoscopy  03/04/2010    noncritical appearing Schatzki ring. SB bx negative  . Elas  06-01-11    Right saphenous ELAS   . Cardioversion N/A 11/13/2013    Procedure: CARDIOVERSION;  Surgeon: Herminio Commons, MD;  Location: [facility];  Service: Endoscopy;  Laterality: N/A;    Family History  Problem Relation Age of Onset  . Stroke Mother   . Heart disease Mother     CHF  - Amputation-Right Leg  . Hyperlipidemia Mother   . Hypertension Mother   . Lung cancer Father   . Cancer Father     Lung    Social History   Social History  . Marital Status: Single    Spouse Name: N/A  . Number of Children: 0  . Years of Education: N/A   Occupational History  . office worker    Social History Main Topics  . Smoking status: Never Smoker   . Smokeless tobacco: Never Used  . Alcohol Use: No  . Drug Use: No  . Sexual Activity: No   Other Topics Concern  . Not on file   Social History Narrative      ROS:  General: Negative for anorexia, weight loss, fever, chills, fatigue, weakness. Eyes: Negative for vision changes.  ENT: Negative for hoarseness, difficulty swallowing , nasal congestion. CV: Negative for chest pain, angina, palpitations, dyspnea on exertion, peripheral edema.  Respiratory: Negative for dyspnea at rest, dyspnea on exertion, cough, sputum, wheezing.  GI: See history of present illness. GU:  Negative for dysuria, hematuria, urinary incontinence, urinary frequency, nocturnal urination.  MS: Negative for joint pain, low back pain.  Derm: Negative for rash or itching.  Neuro: Negative for weakness, abnormal sensation, seizure, frequent headaches, memory loss, confusion.  Psych: Negative for anxiety, depression,  suicidal ideation, hallucinations.  Endo: Negative for unusual weight change.  Heme: Negative for bruising or bleeding. Allergy: Negative for rash or hives.    Physical Examination:  BP 122/78 mmHg  Pulse 69  Temp(Src) 96.8 F (36 C)  Ht 4\' 10"  (1.473 m)  Wt 156 lb 9.6 oz (71.033 kg)  BMI 32.74 kg/m2   General: Well-nourished, well-developed in no acute distress.  Head: Normocephalic, atraumatic.   Eyes: Conjunctiva pink, no icterus. Mouth: Oropharyngeal mucosa moist and pink , no lesions  erythema or exudate. Neck: Supple without thyromegaly, masses, or lymphadenopathy.  Lungs: Clear to auscultation bilaterally.  Heart: Regular rate and rhythm, no murmurs rubs or gallops.  Abdomen: Bowel sounds are normal, nontender, nondistended, no hepatosplenomegaly or masses, no abdominal bruits or    hernia , no rebound or guarding.   Rectal: not performed Extremities: No lower extremity edema. No clubbing or deformities.  Neuro: Alert and oriented x 4 , grossly normal neurologically.  Skin: Warm and dry, no rash or jaundice.   Psych: Alert and cooperative, normal mood and affect.  Labs: Labs from outside source dated February 2016 BUN 38, creatinine 1.71, LFTs normal, hemoglobin A1c 6.4, TSH 5.5, white blood cell count 6700, hemoglobin 13.4, hematocrit 40.6, platelets 309,000  Imaging Studies: Dg Knee Complete 4 Views Right  03/09/2015   CLINICAL DATA:  Recent fall onto the right knee. Continued pain and swelling. Some knee bruising.  EXAM: RIGHT KNEE - COMPLETE 4+ VIEW  COMPARISON:  None.  FINDINGS: No fracture or dislocation.  There is joint space narrowing most evident of the lateral compartment. Calcifications are noted along the menisci. There are small marginal osteophytes from all 3 compartments. Subchondral cystic changes noted along the inferior margin of the dorsal patella.  No joint effusion.  There is soft tissue swelling that is most prominent anterior to the patella.   IMPRESSION: 1. No fracture or acute bony abnormality. 2. No joint effusion. 3. Arthropathic changes as described. Distribution of the arthropathy and the associated chondrocalcinosis suggests CPPD arthropathy. 4. Soft tissue swelling predominating anteriorly.   Electronically Signed   By: Lajean Manes M.D.   On: 03/09/2015 15:41

## 2015-03-26 NOTE — Patient Instructions (Signed)
1. Barium esophagram as scheduled. We will contact you in 5-7 business days with results.

## 2015-03-26 NOTE — Assessment & Plan Note (Signed)
Complains of recurrent esophageal dysphagia predominantly to pills but also state. History of noncritical Schatzki ring several years ago. Complains of breakthrough reflux on omeprazole twice a day. Discussed options of upper endoscopy versus barium pill esophagram. Patient request proceeding with BPE first. She is on Xarelto for history of Afib and does not want to come off right now.

## 2015-03-29 NOTE — Progress Notes (Signed)
CC'ED TO PCP 

## 2015-03-30 ENCOUNTER — Ambulatory Visit (INDEPENDENT_AMBULATORY_CARE_PROVIDER_SITE_OTHER): Payer: Medicare Other | Admitting: Orthopedic Surgery

## 2015-03-30 VITALS — BP 118/71 | Ht <= 58 in | Wt 156.0 lb

## 2015-03-30 DIAGNOSIS — M7051 Other bursitis of knee, right knee: Secondary | ICD-10-CM | POA: Diagnosis not present

## 2015-03-30 NOTE — Progress Notes (Signed)
Patient ID: [name], female   DOB: Apr 22, 1956, 59 y.o.   MRN: [id number]  New patient   Chief Complaint  Patient presents with  . Knee Pain    Right knee pain, no injury. Xrays at [facility].     [name] is a 59 y.o. female.   HPI 59 year old female ran into a piece of furniture a month ago. Presents with continued constant burning stabbing pain and swelling over the patella on the right  Current medication and treatment Tylenol and cold compress. She still has some pain when she puts pressure on the knee  She reports hayfever and redness of the skin frequent urination ankle edema irregular heartbeat shortness of breath and wheezing but no other contributory signs or symptoms constitutional symptoms are negative   Review of Systems See hpi  Past Medical History  Diagnosis Date  . Microscopic colitis 9/11    Colonoscopy  . Hemorrhoids   . Diverticulosis   . Schatzki's ring     Last EGD with esophageal dilatation 5F  9/11  . Congenital heart disease   . Paroxysmal SVT (supraventricular tachycardia)   . IDA (iron deficiency anemia)   . GERD (gastroesophageal reflux disease)   . Hypothyroidism   . Hypertension   . Hyperlipidemia   . Renal insufficiency   . Gout   . PVD (peripheral vascular disease) 09/2004    Right common femoral endarterectomy in April of 2007  . Hyperparathyroidism   . Scoliosis   . Chronic bronchitis   . Varicose veins right leg pain and swelling  . Atrial fibrillation Jan. 2015  . Fall due to stumbling October 08, 2013    Due to shoes  . Diabetes mellitus without complication     Type II    Past Surgical History  Procedure Laterality Date  . Asd repair  Age [facility]  . Right common femoral endarterectomy  2007  . Left parathyroidectomy  2007  . Parathyroid adenoma  2007  . Left hemithyroidectomy    . Tonsillectomy    . Hemorrhoid surgery  2010  . Colonoscopy  03/04/2010    anal canal  hemorrhoids otherwise normal. TI normal. Bx showed lymphocytic colitis  . Esophagogastroduodenoscopy  03/04/2010    noncritical appearing Schatzki ring. SB bx negative  . Elas  06-01-11    Right saphenous ELAS   . Cardioversion N/A 11/13/2013    Procedure: CARDIOVERSION;  Surgeon: Herminio Commons, MD;  Location: [facility];  Service: Endoscopy;  Laterality: N/A;    Family History  Problem Relation Age of Onset  . Stroke Mother   . Heart disease Mother     CHF  - Amputation-Right Leg  . Hyperlipidemia Mother   . Hypertension Mother   . Lung cancer Father   . Cancer Father     Lung    Social History Social History  Substance Use Topics  . Smoking status: Never Smoker   . Smokeless tobacco: Never Used  . Alcohol Use: No    Allergies  Allergen Reactions  . Flecainide Nausea Only and Other (See Comments)    Faint feeling  . Hydrocodone-Acetaminophen Nausea Only and Other (See Comments)    Severe headache  . Ibuprofen Other (See Comments)    Kidney dysfunction  . Oxycodone Hcl Nausea Only and Other (See Comments)    Headache  . Penicillins Nausea Only and Other (See Comments)    Severe headache  Current Outpatient Prescriptions  Medication Sig Dispense Refill  . acetaminophen (TYLENOL) 500 MG tablet Take 500 mg by mouth as needed for mild pain or headache.     . allopurinol (ZYLOPRIM) 100 MG tablet Take 100 mg by mouth 2 (two) times daily.     Marland Kitchen amiodarone (PACERONE) 200 MG tablet TAKE 1 TABLET MON - FRI , TAKE 1/2 TABLET SAT- SUN 90 tablet 3  . calcium carbonate (OS-CAL) 600 MG TABS Take 600 mg by mouth 2 (two) times daily with a meal.      . cholecalciferol (VITAMIN D) 1000 UNITS tablet Take 1,000 Units by mouth daily.    . fenofibrate 160 MG tablet Take 1 tablet by mouth daily.    . furosemide (LASIX) 20 MG tablet Take 1 tablet (20 mg total) by mouth daily. 30 tablet 6  . levothyroxine (SYNTHROID, LEVOTHROID) 112 MCG tablet Take 112 mcg by mouth daily before breakfast.     . losartan (COZAAR) 25 MG tablet Take 12.5 mg by mouth daily.     . Magnesium 250 MG TABS Take 1 tablet by mouth daily.     . Omega-3 Fatty Acids (FISH OIL PO) Take 360 mg by mouth 2 (two) times daily.    Marland Kitchen omeprazole (PRILOSEC) 20 MG capsule TAKE ONE CAPSULE BY MOUTH TWICE DAILY ONE HOUR BEFORE FOOD IF POSSIBLE. 60 capsule 5  . potassium chloride (K-DUR) 10 MEQ tablet Take 10 mEq by mouth daily.     . Probiotic Product (ALIGN PO) Take 1 tablet by mouth daily.     . sitaGLIPtin (JANUVIA) 100 MG tablet Take 50 mg by mouth daily.    Alveda Reasons 15 MG TABS tablet TAKE ONE TABLET BY MOUTH DAILY. 30 tablet 3  . zolpidem (AMBIEN CR) 12.5 MG CR tablet Take 12.5 mg by mouth at bedtime as needed for sleep.    Marland Kitchen COLCRYS 0.6 MG tablet Take 1 tablet by mouth as needed (gout).      No current facility-administered medications for this visit.       Physical Exam Blood pressure 118/71, height 4\' 10"  (1.473 m), weight 156 lb (70.761 kg). Physical Exam The patient is well developed well nourished and well groomed. Orientation to person place and time is normal  Mood is pleasant. Ambulatory status is normal without a limp Skin remains intact without laceration ulceration or erythema Gross motor exam is intact without atrophy. Muscle tone normal grade 5 motor strength Neurovascular exam remains intact Inspection she has swollen patella bursa with roughened skin in that area joint range of motion remains intact no joint remains stable there is some tenderness in this area Deftly not enough fluid fluid to drain Data Reviewed  I interpreted the images as  X-ray show mild arthrosis without acute injury  Assessment   Bursitis right knee posttraumatic Plan   Continue ice Tylenol and appropriate activity modification

## 2015-04-09 ENCOUNTER — Ambulatory Visit (HOSPITAL_COMMUNITY)
Admission: RE | Admit: 2015-04-09 | Discharge: 2015-04-09 | Disposition: A | Discharge status: Home / Self Care | Payer: Medicare Other | Source: Ambulatory Visit | Attending: Gastroenterology | Admitting: Gastroenterology

## 2015-04-09 DIAGNOSIS — R1314 Dysphagia, pharyngoesophageal phase: Secondary | ICD-10-CM

## 2015-04-14 NOTE — Progress Notes (Signed)
Quick Note:  Dr. Gala Romney, Do you think we should offer her EGD or send for manometry. She is on Xarelto for Afib and was reluctant to hold before, that's why I went straight to BPE. Her last EGD was in 2011 with noncritical Schatzki's ring. ______

## 2015-04-14 NOTE — Progress Notes (Signed)
So, if we confirm a motility disorder on manometry, we are still limited as to what we can do for this lady. Given a history of a shatski's ring, passage of a larger bore [facility] dilator often helps in these situations. I understand her reluctance to come off Xarelto; we could bridge with Lovenox for 2 days prior to the procedure  - that would get [place] around the anticoagulation issue. What you think?

## 2015-04-19 NOTE — Progress Notes (Signed)
Quick Note:  . ______ 

## 2015-04-20 ENCOUNTER — Telehealth: Payer: Self-pay | Admitting: Internal Medicine

## 2015-04-20 NOTE — Telephone Encounter (Signed)
Pt called to see if her results from her DG Esophagram was back. Please call [phone] and leave message if she doesn't answer

## 2015-04-20 NOTE — Telephone Encounter (Signed)
Routing to LSL 

## 2015-04-21 NOTE — Telephone Encounter (Signed)
See result note.  

## 2015-04-21 NOTE — Progress Notes (Signed)
Quick Note:  Please let patient know her esophagram showed poor esophageal motility and contrast sat in upper to mid esophagus. No obvious stricture but Dr. Gala Romney recommends EGD with dilation prior to further work up.  Patient was hesitant to EGD previously because she didn't want to come off Xarelto. We can either discuss with her cardiologist about holding for procedure and get their input or RMR said we could give Lovenox bridging.   Let me know what patient decides. ______

## 2015-04-22 ENCOUNTER — Telehealth: Payer: Self-pay

## 2015-04-22 NOTE — Telephone Encounter (Signed)
Dr.Taylor,  This patient was seen in our office for dysphagia, she needs an EGD with possible dilation. Is it ok for her to stop her xarelto for 48 hours prior to her procedure?  Thank you,  Burnadette Peter LPN

## 2015-04-23 NOTE — Progress Notes (Signed)
APPT MADE AND PATIENT CALLED AND IS AWARE OF APPT DATE AND TIME

## 2015-04-26 NOTE — Telephone Encounter (Signed)
Yes. GT 

## 2015-04-29 ENCOUNTER — Other Ambulatory Visit: Payer: Self-pay | Admitting: Otolaryngology

## 2015-04-29 ENCOUNTER — Ambulatory Visit (INDEPENDENT_AMBULATORY_CARE_PROVIDER_SITE_OTHER): Payer: Medicare Other | Admitting: Gastroenterology

## 2015-04-29 ENCOUNTER — Encounter: Payer: Self-pay | Admitting: Gastroenterology

## 2015-04-29 ENCOUNTER — Other Ambulatory Visit: Payer: Self-pay

## 2015-04-29 VITALS — BP 112/80 | HR 79 | Temp 97.9°F | Ht 59.0 in | Wt 156.0 lb

## 2015-04-29 DIAGNOSIS — K229 Disease of esophagus, unspecified: Secondary | ICD-10-CM | POA: Diagnosis not present

## 2015-04-29 DIAGNOSIS — R1310 Dysphagia, unspecified: Secondary | ICD-10-CM

## 2015-04-29 DIAGNOSIS — R1314 Dysphagia, pharyngoesophageal phase: Secondary | ICD-10-CM | POA: Diagnosis not present

## 2015-04-29 DIAGNOSIS — R1319 Other dysphagia: Secondary | ICD-10-CM

## 2015-04-29 DIAGNOSIS — H9192 Unspecified hearing loss, left ear: Secondary | ICD-10-CM

## 2015-04-29 NOTE — Progress Notes (Signed)
Primary Care Physician:  Wende Neighbors, MD  Primary Gastroenterologist:  Garfield Cornea, MD   Chief Complaint  Patient presents with  . EGD    HPI:  [name] is a 59 y.o. female here for follow-up of dysphagia. She was seen in September 2016 for dysphagia. At that time she was reluctant to undergo upper endoscopy because she was on Xarelto, therefore we did a barium esophagram. Study showed disruption of all primary esophageal peristaltic waves with stasis of contrast in upper to mid esophagus, occasional mid to distal tertiary contractions. 2mm tablet passed freely.   She continues to have problems swallowing pills, solid foods. No problems with liquids usually. When eating it feels like fluid backing up. Starts to gag. Wakes up at nighttime choking/strangling. Some heartburn on omeprazole BID. BM regular and no melena, brbpr.     Current Outpatient Prescriptions  Medication Sig Dispense Refill  . acetaminophen (TYLENOL) 500 MG tablet Take 500 mg by mouth as needed for mild pain or headache.     . allopurinol (ZYLOPRIM) 100 MG tablet Take 100 mg by mouth 2 (two) times daily.     Marland Kitchen amiodarone (PACERONE) 200 MG tablet TAKE 1 TABLET MON - FRI , TAKE 1/2 TABLET SAT- SUN 90 tablet 3  . calcium carbonate (OS-CAL) 600 MG TABS Take 600 mg by mouth 2 (two) times daily with a meal.      . cholecalciferol (VITAMIN D) 1000 UNITS tablet Take 1,000 Units by mouth daily.    Marland Kitchen COLCRYS 0.6 MG tablet Take 1 tablet by mouth as needed (gout).     . fenofibrate 160 MG tablet Take 1 tablet by mouth daily.    . furosemide (LASIX) 20 MG tablet Take 1 tablet (20 mg total) by mouth daily. 30 tablet 6  . levothyroxine (SYNTHROID, LEVOTHROID) 112 MCG tablet Take 112 mcg by mouth daily before breakfast.    . losartan (COZAAR) 25 MG tablet Take 12.5 mg by mouth daily.     . Magnesium 250 MG TABS Take 1 tablet by mouth daily.     . Omega-3 Fatty Acids (FISH OIL PO) Take 360 mg by mouth 2 (two) times daily.    Marland Kitchen  omeprazole (PRILOSEC) 20 MG capsule TAKE ONE CAPSULE BY MOUTH TWICE DAILY ONE HOUR BEFORE FOOD IF POSSIBLE. 60 capsule 5  . potassium chloride (K-DUR) 10 MEQ tablet Take 10 mEq by mouth daily.     . Probiotic Product (ALIGN PO) Take 1 tablet by mouth daily.     . sitaGLIPtin (JANUVIA) 100 MG tablet Take 50 mg by mouth daily.    Alveda Reasons 15 MG TABS tablet TAKE ONE TABLET BY MOUTH DAILY. 30 tablet 3  . zolpidem (AMBIEN CR) 12.5 MG CR tablet Take 12.5 mg by mouth at bedtime as needed for sleep.     No current facility-administered medications for this visit.    Allergies as of 04/29/2015 - Review Complete 04/29/2015  Allergen Reaction Noted  . Flecainide Nausea Only and Other (See Comments) 11/07/2013  . Hydrocodone-acetaminophen Nausea Only and Other (See Comments)   . Ibuprofen Other (See Comments) 09/05/2011  . Oxycodone hcl Nausea Only and Other (See Comments)   . Penicillins Nausea Only and Other (See Comments)     Past Medical History  Diagnosis Date  . Microscopic colitis 9/11    Colonoscopy  . Hemorrhoids   . Diverticulosis   . Schatzki's ring     Last EGD with esophageal dilatation 38F  9/11  .  Congenital heart disease   . Paroxysmal SVT (supraventricular tachycardia) ([place])   . IDA (iron deficiency anemia)   . GERD (gastroesophageal reflux disease)   . Hypothyroidism   . Hypertension   . Hyperlipidemia   . Renal insufficiency   . Gout   . PVD (peripheral vascular disease) ([place]) 09/2004    Right common femoral endarterectomy in April of 2007  . Hyperparathyroidism   . Scoliosis   . Chronic bronchitis   . Varicose veins right leg pain and swelling  . Atrial fibrillation ([place]) Jan. 2015  . Fall due to stumbling October 08, 2013    Due to shoes  . Diabetes mellitus without complication ([place])     Type II    Past Surgical History  Procedure Laterality Date  . Asd repair  Age [facility]  . Right common femoral endarterectomy   2007  . Left parathyroidectomy  2007  . Parathyroid adenoma  2007  . Left hemithyroidectomy    . Tonsillectomy    . Hemorrhoid surgery  2010  . Colonoscopy  03/04/2010    anal canal hemorrhoids otherwise normal. TI normal. Bx showed lymphocytic colitis. next TCS 02/2020  . Esophagogastroduodenoscopy  03/04/2010    noncritical appearing Schatzki ring. SB bx negative  . Elas  06-01-11    Right saphenous ELAS   . Cardioversion N/A 11/13/2013    Procedure: CARDIOVERSION;  Surgeon: Herminio Commons, MD;  Location: [facility];  Service: Endoscopy;  Laterality: N/A;  . Esophagogastroduodenoscopy  05/2008    erosive reflux esophagatitis, noncritical Schatzki ring  . Small bowel capsule  12 2009    Mid to distal small bowel with edema, erosions, tiny ulceration felt to be NSAID related  . Colonoscopy  April 2008    Rehman: Pancolonic diverticulosis, external hemorrhoids    Family History  Problem Relation Age of Onset  . Stroke Mother   . Heart disease Mother     CHF  - Amputation-Right Leg  . Hyperlipidemia Mother   . Hypertension Mother   . Lung cancer Father   . Cancer Father     Lung    Social History   Social History  . Marital Status: Single    Spouse Name: N/A  . Number of Children: 0  . Years of Education: N/A   Occupational History  . office worker    Social History Main Topics  . Smoking status: Never Smoker   . Smokeless tobacco: Never Used  . Alcohol Use: No  . Drug Use: No  . Sexual Activity: No   Other Topics Concern  . Not on file   Social History Narrative      ROS:  General: Negative for anorexia, weight loss, fever, chills, fatigue, weakness. Eyes: Negative for vision changes.  ENT: Negative for hoarseness, nasal congestion. CV: Negative for chest pain, angina, palpitations, dyspnea on exertion, peripheral edema.  Respiratory: Negative for dyspnea at rest, dyspnea on exertion, cough, sputum, wheezing.  GI: See history of present illness. GU:   Negative for dysuria, hematuria, urinary incontinence, urinary frequency, nocturnal urination.  MS: Negative for joint pain, low back pain.  Derm: Negative for rash or itching.  Neuro: Negative for weakness, abnormal sensation, seizure, frequent headaches, memory loss, confusion.  Psych: Negative for anxiety, depression, suicidal ideation, hallucinations.  Endo: Negative for unusual weight change.  Heme: Negative for bruising or bleeding. Allergy: Negative for rash or hives.    Physical Examination:  BP  112/80 mmHg  Pulse 79  Temp(Src) 97.9 F (36.6 C) (Oral)  Ht 4\' 11"  (1.499 m)  Wt 156 lb (70.761 kg)  BMI 31.49 kg/m2   General: Well-nourished, well-developed in no acute distress.  Head: Normocephalic, atraumatic.   Eyes: Conjunctiva pink, no icterus. Mouth: Oropharyngeal mucosa moist and pink , no lesions erythema or exudate. Neck: Supple without thyromegaly, masses, or lymphadenopathy.  Lungs: Clear to auscultation bilaterally.  Heart: Regular rate and rhythm, no murmurs rubs or gallops.  Abdomen: Bowel sounds are normal, nontender, nondistended, no hepatosplenomegaly or masses, no abdominal bruits or    hernia , no rebound or guarding.   Rectal: not performed Extremities: No lower extremity edema. No clubbing or deformities.  Neuro: Alert and oriented x 4 , grossly normal neurologically.  Skin: Warm and dry, no rash or jaundice.   Psych: Alert and cooperative, normal mood and affect.   Imaging Studies: Dg Esophagus  04/09/2015  CLINICAL DATA:  Throat soreness, difficulty swallowing.  Dysphagia. EXAM: ESOPHOGRAM / BARIUM SWALLOW / BARIUM TABLET STUDY TECHNIQUE: Combined double contrast and single contrast examination performed using effervescent crystals, thick barium liquid, and thin barium liquid. The patient was observed with fluoroscopy swallowing a 13 mm barium sulphate tablet. FLUOROSCOPY TIME:  Radiation Exposure Index (as provided by the fluoroscopic device): If the  device does not provide the exposure index: Fluoroscopy Time:  2 minutes Number of Acquired Images:  0 COMPARISON:  None. FINDINGS: Fluoroscopic evaluation of swallowing demonstrates disruption of all primary esophageal peristaltic waves with stasis of contrast in the upper to mid esophagus. Occasional mid to distal tertiary contractions. No fixed stricture, fold thickening or mass. No reflux with the water siphon maneuver. The patient swallowed a 13 mm barium tablet which freely passed into the stomach. IMPRESSION: Esophageal dysmotility as above.  No fixed stricture. Electronically Signed   By: Rolm Baptise M.D.   On: 04/09/2015 09:41

## 2015-04-29 NOTE — Patient Instructions (Signed)
1. Upper endoscopy with Dr. Gala Romney. Please see separate instructions. You need to hold your Xarelto 48 hours before your procedure.

## 2015-05-01 NOTE — Assessment & Plan Note (Signed)
59 y/o female with pill, solid food dysphagia. Recent abnormal barium esophagus as outlined above. Discussed with Dr. Gala Romney. Plan on EGD/ED in near future.  I have discussed the risks, alternatives, benefits with regards to but not limited to the risk of reaction to medication, bleeding, infection, perforation and the patient is agreeable to proceed. Written consent to be obtained.  Hold Xarelto 48 hours before procedure. Discussed with Dr. Lovena Le.

## 2015-05-02 ENCOUNTER — Telehealth: Payer: Self-pay | Admitting: Gastroenterology

## 2015-05-02 NOTE — Telephone Encounter (Signed)
Please let patient know I reviewed her chart and her next colonoscopy is due in 02/2020.   Please NIC.

## 2015-05-03 NOTE — Progress Notes (Signed)
CC'D TO PCP °

## 2015-05-03 NOTE — Telephone Encounter (Signed)
Tried to call pt- LMOM 

## 2015-05-04 NOTE — Telephone Encounter (Signed)
Pt is aware. Please NIC.

## 2015-05-04 NOTE — Telephone Encounter (Signed)
ON RECALL  °

## 2015-05-12 ENCOUNTER — Other Ambulatory Visit: Payer: Medicare Other

## 2015-05-16 ENCOUNTER — Other Ambulatory Visit: Payer: Self-pay | Admitting: Cardiology

## 2015-05-19 ENCOUNTER — Ambulatory Visit
Admission: RE | Admit: 2015-05-19 | Discharge: 2015-05-19 | Disposition: A | Discharge status: Home / Self Care | Payer: Medicare Other | Source: Ambulatory Visit | Attending: Otolaryngology | Admitting: Otolaryngology

## 2015-05-19 ENCOUNTER — Other Ambulatory Visit: Payer: Medicare Other

## 2015-05-19 DIAGNOSIS — H9192 Unspecified hearing loss, left ear: Secondary | ICD-10-CM

## 2015-05-28 ENCOUNTER — Encounter (HOSPITAL_COMMUNITY): Payer: Self-pay | Admitting: *Deleted

## 2015-05-28 ENCOUNTER — Ambulatory Visit (HOSPITAL_COMMUNITY)
Admission: RE | Admit: 2015-05-28 | Discharge: 2015-05-28 | Disposition: A | Discharge status: Home / Self Care | Payer: Medicare Other | Source: Ambulatory Visit | Attending: Internal Medicine | Admitting: Internal Medicine

## 2015-05-28 ENCOUNTER — Encounter (HOSPITAL_COMMUNITY)
Admission: RE | Disposition: A | Discharge status: Home / Self Care | Payer: Self-pay | Source: Ambulatory Visit | Attending: Internal Medicine

## 2015-05-28 DIAGNOSIS — R12 Heartburn: Secondary | ICD-10-CM | POA: Diagnosis not present

## 2015-05-28 DIAGNOSIS — E785 Hyperlipidemia, unspecified: Secondary | ICD-10-CM | POA: Insufficient documentation

## 2015-05-28 DIAGNOSIS — E119 Type 2 diabetes mellitus without complications: Secondary | ICD-10-CM | POA: Insufficient documentation

## 2015-05-28 DIAGNOSIS — Z79899 Other long term (current) drug therapy: Secondary | ICD-10-CM | POA: Insufficient documentation

## 2015-05-28 DIAGNOSIS — K219 Gastro-esophageal reflux disease without esophagitis: Secondary | ICD-10-CM | POA: Insufficient documentation

## 2015-05-28 DIAGNOSIS — I4891 Unspecified atrial fibrillation: Secondary | ICD-10-CM | POA: Diagnosis not present

## 2015-05-28 DIAGNOSIS — Q249 Congenital malformation of heart, unspecified: Secondary | ICD-10-CM | POA: Diagnosis not present

## 2015-05-28 DIAGNOSIS — K229 Disease of esophagus, unspecified: Secondary | ICD-10-CM | POA: Diagnosis not present

## 2015-05-28 DIAGNOSIS — R1310 Dysphagia, unspecified: Secondary | ICD-10-CM | POA: Diagnosis not present

## 2015-05-28 DIAGNOSIS — E039 Hypothyroidism, unspecified: Secondary | ICD-10-CM | POA: Diagnosis not present

## 2015-05-28 DIAGNOSIS — K449 Diaphragmatic hernia without obstruction or gangrene: Secondary | ICD-10-CM | POA: Diagnosis not present

## 2015-05-28 DIAGNOSIS — Z801 Family history of malignant neoplasm of trachea, bronchus and lung: Secondary | ICD-10-CM | POA: Diagnosis not present

## 2015-05-28 DIAGNOSIS — I471 Supraventricular tachycardia: Secondary | ICD-10-CM | POA: Diagnosis not present

## 2015-05-28 DIAGNOSIS — I10 Essential (primary) hypertension: Secondary | ICD-10-CM | POA: Diagnosis not present

## 2015-05-28 HISTORY — PX: ESOPHAGEAL DILATION: SHX303

## 2015-05-28 HISTORY — PX: ESOPHAGOGASTRODUODENOSCOPY: SHX5428

## 2015-05-28 LAB — GLUCOSE, CAPILLARY: GLUCOSE-CAPILLARY: 104 mg/dL — AB (ref 65–99)

## 2015-05-28 SURGERY — EGD (ESOPHAGOGASTRODUODENOSCOPY)
Anesthesia: Moderate Sedation

## 2015-05-28 MED ORDER — MEPERIDINE HCL 100 MG/ML IJ SOLN
INTRAMUSCULAR | Status: DC | PRN
  Administered 2015-05-28: 50 mg via INTRAVENOUS
  Administered 2015-05-28: 25 mg via INTRAVENOUS

## 2015-05-28 MED ORDER — MEPERIDINE HCL 100 MG/ML IJ SOLN
INTRAMUSCULAR | Status: AC
  Filled 2015-05-28: qty 2

## 2015-05-28 MED ORDER — ONDANSETRON HCL 4 MG/2ML IJ SOLN
INTRAMUSCULAR | Status: DC | PRN
  Administered 2015-05-28: 4 mg via INTRAVENOUS

## 2015-05-28 MED ORDER — ONDANSETRON HCL 4 MG/2ML IJ SOLN
INTRAMUSCULAR | Status: AC
  Filled 2015-05-28: qty 2

## 2015-05-28 MED ORDER — LIDOCAINE VISCOUS 2 % MT SOLN
OROMUCOSAL | Status: DC | PRN
  Administered 2015-05-28: 1 via OROMUCOSAL

## 2015-05-28 MED ORDER — MIDAZOLAM HCL 5 MG/5ML IJ SOLN
INTRAMUSCULAR | Status: AC
  Filled 2015-05-28: qty 10

## 2015-05-28 MED ORDER — SODIUM CHLORIDE 0.9 % IV SOLN
INTRAVENOUS | Status: DC
  Administered 2015-05-28: 1000 mL via INTRAVENOUS

## 2015-05-28 MED ORDER — LIDOCAINE VISCOUS 2 % MT SOLN
OROMUCOSAL | Status: AC
  Filled 2015-05-28: qty 15

## 2015-05-28 MED ORDER — MIDAZOLAM HCL 5 MG/5ML IJ SOLN
INTRAMUSCULAR | Status: DC | PRN
  Administered 2015-05-28 (×2): 1 mg via INTRAVENOUS
  Administered 2015-05-28: 2 mg via INTRAVENOUS

## 2015-05-28 NOTE — Discharge Instructions (Signed)
EGD Discharge instructions Please read the instructions outlined below and refer to this sheet in the next few weeks. These discharge instructions provide you with general information on caring for yourself after you leave the hospital. Your doctor may also give you specific instructions. While your treatment has been planned according to the most current medical practices available, unavoidable complications occasionally occur. If you have any problems or questions after discharge, please call your doctor. ACTIVITY  You may resume your regular activity but move at a slower pace for the next 24 hours.   Take frequent rest periods for the next 24 hours.   Walking will help expel (get rid of) the air and reduce the bloated feeling in your abdomen.   No driving for 24 hours (because of the anesthesia (medicine) used during the test).   You may shower.   Do not sign any important legal documents or operate any machinery for 24 hours (because of the anesthesia used during the test).  NUTRITION  Drink plenty of fluids.   You may resume your normal diet.   Begin with a light meal and progress to your normal diet.   Avoid alcoholic beverages for 24 hours or as instructed by your caregiver.  MEDICATIONS  You may resume your normal medications unless your caregiver tells you otherwise.  WHAT YOU CAN EXPECT TODAY  You may experience abdominal discomfort such as a feeling of fullness or gas pains.  FOLLOW-UP  Your doctor will discuss the results of your test with you.  SEEK IMMEDIATE MEDICAL ATTENTION IF ANY OF THE FOLLOWING OCCUR:  Excessive nausea (feeling sick to your stomach) and/or vomiting.   Severe abdominal pain and distention (swelling).   Trouble swallowing.   Temperature over 101 F (37.8 C).   Rectal bleeding or vomiting of blood.    Continue omeprazole 20 mg twice daily  Resumes Xarelto today  Office visit with [place] in one year

## 2015-05-28 NOTE — H&P (View-Only) (Signed)
Primary Care Physician:  Wende Neighbors, MD  Primary Gastroenterologist:  Garfield Cornea, MD   Chief Complaint  Patient presents with  . EGD    HPI:  [name] is a 59 y.o. female here for follow-up of dysphagia. She was seen in September 2016 for dysphagia. At that time she was reluctant to undergo upper endoscopy because she was on Xarelto, therefore we did a barium esophagram. Study showed disruption of all primary esophageal peristaltic waves with stasis of contrast in upper to mid esophagus, occasional mid to distal tertiary contractions. 49mm tablet passed freely.   She continues to have problems swallowing pills, solid foods. No problems with liquids usually. When eating it feels like fluid backing up. Starts to gag. Wakes up at nighttime choking/strangling. Some heartburn on omeprazole BID. BM regular and no melena, brbpr.     Current Outpatient Prescriptions  Medication Sig Dispense Refill  . acetaminophen (TYLENOL) 500 MG tablet Take 500 mg by mouth as needed for mild pain or headache.     . allopurinol (ZYLOPRIM) 100 MG tablet Take 100 mg by mouth 2 (two) times daily.     Marland Kitchen amiodarone (PACERONE) 200 MG tablet TAKE 1 TABLET MON - FRI , TAKE 1/2 TABLET SAT- SUN 90 tablet 3  . calcium carbonate (OS-CAL) 600 MG TABS Take 600 mg by mouth 2 (two) times daily with a meal.      . cholecalciferol (VITAMIN D) 1000 UNITS tablet Take 1,000 Units by mouth daily.    Marland Kitchen COLCRYS 0.6 MG tablet Take 1 tablet by mouth as needed (gout).     . fenofibrate 160 MG tablet Take 1 tablet by mouth daily.    . furosemide (LASIX) 20 MG tablet Take 1 tablet (20 mg total) by mouth daily. 30 tablet 6  . levothyroxine (SYNTHROID, LEVOTHROID) 112 MCG tablet Take 112 mcg by mouth daily before breakfast.    . losartan (COZAAR) 25 MG tablet Take 12.5 mg by mouth daily.     . Magnesium 250 MG TABS Take 1 tablet by mouth daily.     . Omega-3 Fatty Acids (FISH OIL PO) Take 360 mg by mouth 2 (two) times daily.    Marland Kitchen  omeprazole (PRILOSEC) 20 MG capsule TAKE ONE CAPSULE BY MOUTH TWICE DAILY ONE HOUR BEFORE FOOD IF POSSIBLE. 60 capsule 5  . potassium chloride (K-DUR) 10 MEQ tablet Take 10 mEq by mouth daily.     . Probiotic Product (ALIGN PO) Take 1 tablet by mouth daily.     . sitaGLIPtin (JANUVIA) 100 MG tablet Take 50 mg by mouth daily.    Alveda Reasons 15 MG TABS tablet TAKE ONE TABLET BY MOUTH DAILY. 30 tablet 3  . zolpidem (AMBIEN CR) 12.5 MG CR tablet Take 12.5 mg by mouth at bedtime as needed for sleep.     No current facility-administered medications for this visit.    Allergies as of 04/29/2015 - Review Complete 04/29/2015  Allergen Reaction Noted  . Flecainide Nausea Only and Other (See Comments) 11/07/2013  . Hydrocodone-acetaminophen Nausea Only and Other (See Comments)   . Ibuprofen Other (See Comments) 09/05/2011  . Oxycodone hcl Nausea Only and Other (See Comments)   . Penicillins Nausea Only and Other (See Comments)     Past Medical History  Diagnosis Date  . Microscopic colitis 9/11    Colonoscopy  . Hemorrhoids   . Diverticulosis   . Schatzki's ring     Last EGD with esophageal dilatation 97F  9/11  .  Congenital heart disease   . Paroxysmal SVT (supraventricular tachycardia) ([place])   . IDA (iron deficiency anemia)   . GERD (gastroesophageal reflux disease)   . Hypothyroidism   . Hypertension   . Hyperlipidemia   . Renal insufficiency   . Gout   . PVD (peripheral vascular disease) ([place]) 09/2004    Right common femoral endarterectomy in April of 2007  . Hyperparathyroidism   . Scoliosis   . Chronic bronchitis   . Varicose veins right leg pain and swelling  . Atrial fibrillation ([place]) Jan. 2015  . Fall due to stumbling October 08, 2013    Due to shoes  . Diabetes mellitus without complication ([place])     Type II    Past Surgical History  Procedure Laterality Date  . Asd repair  Age [facility]  . Right common femoral endarterectomy   2007  . Left parathyroidectomy  2007  . Parathyroid adenoma  2007  . Left hemithyroidectomy    . Tonsillectomy    . Hemorrhoid surgery  2010  . Colonoscopy  03/04/2010    anal canal hemorrhoids otherwise normal. TI normal. Bx showed lymphocytic colitis. next TCS 02/2020  . Esophagogastroduodenoscopy  03/04/2010    noncritical appearing Schatzki ring. SB bx negative  . Elas  06-01-11    Right saphenous ELAS   . Cardioversion N/A 11/13/2013    Procedure: CARDIOVERSION;  Surgeon: Herminio Commons, MD;  Location: [facility];  Service: Endoscopy;  Laterality: N/A;  . Esophagogastroduodenoscopy  05/2008    erosive reflux esophagatitis, noncritical Schatzki ring  . Small bowel capsule  12 2009    Mid to distal small bowel with edema, erosions, tiny ulceration felt to be NSAID related  . Colonoscopy  April 2008    Rehman: Pancolonic diverticulosis, external hemorrhoids    Family History  Problem Relation Age of Onset  . Stroke Mother   . Heart disease Mother     CHF  - Amputation-Right Leg  . Hyperlipidemia Mother   . Hypertension Mother   . Lung cancer Father   . Cancer Father     Lung    Social History   Social History  . Marital Status: Single    Spouse Name: N/A  . Number of Children: 0  . Years of Education: N/A   Occupational History  . office worker    Social History Main Topics  . Smoking status: Never Smoker   . Smokeless tobacco: Never Used  . Alcohol Use: No  . Drug Use: No  . Sexual Activity: No   Other Topics Concern  . Not on file   Social History Narrative      ROS:  General: Negative for anorexia, weight loss, fever, chills, fatigue, weakness. Eyes: Negative for vision changes.  ENT: Negative for hoarseness, nasal congestion. CV: Negative for chest pain, angina, palpitations, dyspnea on exertion, peripheral edema.  Respiratory: Negative for dyspnea at rest, dyspnea on exertion, cough, sputum, wheezing.  GI: See history of present illness. GU:   Negative for dysuria, hematuria, urinary incontinence, urinary frequency, nocturnal urination.  MS: Negative for joint pain, low back pain.  Derm: Negative for rash or itching.  Neuro: Negative for weakness, abnormal sensation, seizure, frequent headaches, memory loss, confusion.  Psych: Negative for anxiety, depression, suicidal ideation, hallucinations.  Endo: Negative for unusual weight change.  Heme: Negative for bruising or bleeding. Allergy: Negative for rash or hives.    Physical Examination:  BP  112/80 mmHg  Pulse 79  Temp(Src) 97.9 F (36.6 C) (Oral)  Ht 4\' 11"  (1.499 m)  Wt 156 lb (70.761 kg)  BMI 31.49 kg/m2   General: Well-nourished, well-developed in no acute distress.  Head: Normocephalic, atraumatic.   Eyes: Conjunctiva pink, no icterus. Mouth: Oropharyngeal mucosa moist and pink , no lesions erythema or exudate. Neck: Supple without thyromegaly, masses, or lymphadenopathy.  Lungs: Clear to auscultation bilaterally.  Heart: Regular rate and rhythm, no murmurs rubs or gallops.  Abdomen: Bowel sounds are normal, nontender, nondistended, no hepatosplenomegaly or masses, no abdominal bruits or    hernia , no rebound or guarding.   Rectal: not performed Extremities: No lower extremity edema. No clubbing or deformities.  Neuro: Alert and oriented x 4 , grossly normal neurologically.  Skin: Warm and dry, no rash or jaundice.   Psych: Alert and cooperative, normal mood and affect.   Imaging Studies: Dg Esophagus  04/09/2015  CLINICAL DATA:  Throat soreness, difficulty swallowing.  Dysphagia. EXAM: ESOPHOGRAM / BARIUM SWALLOW / BARIUM TABLET STUDY TECHNIQUE: Combined double contrast and single contrast examination performed using effervescent crystals, thick barium liquid, and thin barium liquid. The patient was observed with fluoroscopy swallowing a 13 mm barium sulphate tablet. FLUOROSCOPY TIME:  Radiation Exposure Index (as provided by the fluoroscopic device): If the  device does not provide the exposure index: Fluoroscopy Time:  2 minutes Number of Acquired Images:  0 COMPARISON:  None. FINDINGS: Fluoroscopic evaluation of swallowing demonstrates disruption of all primary esophageal peristaltic waves with stasis of contrast in the upper to mid esophagus. Occasional mid to distal tertiary contractions. No fixed stricture, fold thickening or mass. No reflux with the water siphon maneuver. The patient swallowed a 13 mm barium tablet which freely passed into the stomach. IMPRESSION: Esophageal dysmotility as above.  No fixed stricture. Electronically Signed   By: Rolm Baptise M.D.   On: 04/09/2015 09:41

## 2015-05-28 NOTE — Interval H&P Note (Signed)
History and Physical Interval Note:  05/28/2015 9:17 AM  [name]  has presented today for surgery, with the diagnosis of dysphagia, abnormal esophagus  The various methods of treatment have been discussed with the patient and family. After consideration of risks, benefits and other options for treatment, the patient has consented to  Procedure(s) with comments: ESOPHAGOGASTRODUODENOSCOPY (EGD) (N/A) - 0915 ESOPHAGEAL DILATION (N/A) as a surgical intervention .  The patient's history has been reviewed, patient examined, no change in status, stable for surgery.  I have reviewed the patient's chart and labs.  Questions were answered to the patient's satisfaction.     Anushri Casalino No change. EGD with ED as feasible/ appropriate.  The risks, benefits, limitations, alternatives and imponderables have been reviewed with the patient. Potential for esophageal dilation, biopsy, etc. have also been reviewed.  Questions have been answered. All parties agreeable.

## 2015-05-28 NOTE — Op Note (Signed)
[facility] [address] [place], [address]   ENDOSCOPY PROCEDURE REPORT  PATIENT: [name], [name]  MR#: [id number] BIRTHDATE: 24-Apr-1956 , 8  yrs. old GENDER: female ENDOSCOPIST: R.  Garfield Cornea, MD FACP FACG REFERRED BY:  Delphina Cahill, M.D.  Evans Lance, M.D. PROCEDURE DATE:  2015/05/30 PROCEDURE:  EGD with Venia Minks dilation of esophagus INDICATIONS:  Dysphagia; Abnormal Barium Pill Esophagram. MEDICATIONS: Versed 4 mg IV and Demerol 75 mg IV.  Xylocaine gel orally.  Zofran 4 mg IV. ASA CLASS:      Class II  CONSENT: The risks, benefits, limitations, alternatives and imponderables have been discussed.  The potential for biopsy, esophogeal dilation, etc. have also been reviewed.  Questions have been answered.  All parties agreeable.  Please see the history and physical in the medical record for more information.  DESCRIPTION OF PROCEDURE: After the risks benefits and alternatives of the procedure were thoroughly explained, informed consent was obtained.  The [id number] endoscope was introduced through the mouth and advanced to the second portion of the duodenum , limited by Without limitations. The instrument was slowly withdrawn as the mucosa was fully examined. Estimated blood loss is zero unless otherwise noted in this procedure report.    Somewhat baggy, tubular esophagus.  It was patent throughout its course.  Normal-appearing mucosa.  Stomach empty.  Small hiatal hernia.  Normal-appearing gastric mucosa.  Patent pylorus. Normal-appearing first and second portion of the duodenum.  scope was withdrawn and a 56 [place] Maloney dilator was passed to full insertion easily. A look back reveal no apparent complications related to this maneuver.          The scope was then withdrawn from the patient and the procedure completed.  COMPLICATIONS: There were no immediate complications.  ENDOSCOPIC IMPRESSION: Abnormal esophagus as described?"likely  a result of underlying esophageal motility disorder. Status post [facility] dilation. Small hiatal hernia.  RECOMMENDATIONS: Continue omeprazole twice daily. Office visit in one year.  REPEAT EXAM:  eSigned:  R. Garfield Cornea, MD Rosalita Chessman [facility] 05/30/2015 9:52 AM    CC:  CPT CODES: ICD CODES:  The ICD and CPT codes recommended by this software are interpretations from the data that the clinical staff has captured with the software.  The verification of the translation of this report to the ICD and CPT codes and modifiers is the sole responsibility of the health care institution and practicing physician where this report was generated.  [employer] will not be held responsible for the validity of the ICD and CPT codes included on this report.  AMA assumes no liability for data contained or not contained herein. CPT is a Designer, television/film set of the [employer].

## 2015-05-31 ENCOUNTER — Encounter (HOSPITAL_COMMUNITY): Payer: Self-pay | Admitting: Internal Medicine

## 2015-07-13 ENCOUNTER — Other Ambulatory Visit: Payer: Self-pay | Admitting: Nurse Practitioner

## 2015-09-09 ENCOUNTER — Other Ambulatory Visit: Payer: Self-pay | Admitting: Internal Medicine

## 2015-10-06 ENCOUNTER — Other Ambulatory Visit: Payer: Self-pay | Admitting: Internal Medicine

## 2015-10-29 ENCOUNTER — Ambulatory Visit (INDEPENDENT_AMBULATORY_CARE_PROVIDER_SITE_OTHER): Payer: Medicare Other | Admitting: Gastroenterology

## 2015-10-29 ENCOUNTER — Encounter: Payer: Self-pay | Admitting: Gastroenterology

## 2015-10-29 VITALS — BP 133/78 | HR 77 | Temp 95.8°F | Ht 58.5 in | Wt 152.2 lb

## 2015-10-29 DIAGNOSIS — R1011 Right upper quadrant pain: Secondary | ICD-10-CM | POA: Diagnosis not present

## 2015-10-29 DIAGNOSIS — K625 Hemorrhage of anus and rectum: Secondary | ICD-10-CM | POA: Diagnosis not present

## 2015-10-29 DIAGNOSIS — D649 Anemia, unspecified: Secondary | ICD-10-CM | POA: Diagnosis not present

## 2015-10-29 LAB — CBC WITH DIFFERENTIAL/PLATELET
BASOS ABS: 138 {cells}/uL (ref 0–200)
Basophils Relative: 2 %
EOS ABS: 138 {cells}/uL (ref 15–500)
Eosinophils Relative: 2 %
HCT: 33.8 % — ABNORMAL LOW (ref 35.0–45.0)
HEMOGLOBIN: 10.3 g/dL — AB (ref 11.7–15.5)
LYMPHS ABS: 1932 {cells}/uL (ref 850–3900)
Lymphocytes Relative: 28 %
MCH: 24.3 pg — AB (ref 27.0–33.0)
MCHC: 30.5 g/dL — ABNORMAL LOW (ref 32.0–36.0)
MCV: 79.9 fL — AB (ref 80.0–100.0)
MONO ABS: 690 {cells}/uL (ref 200–950)
MPV: 10.6 fL (ref 7.5–12.5)
Monocytes Relative: 10 %
NEUTROS ABS: 4002 {cells}/uL (ref 1500–7800)
Neutrophils Relative %: 58 %
Platelets: 310 10*3/uL (ref 140–400)
RBC: 4.23 MIL/uL (ref 3.80–5.10)
RDW: 15.8 % — ABNORMAL HIGH (ref 11.0–15.0)
WBC: 6.9 10*3/uL (ref 3.8–10.8)

## 2015-10-29 LAB — IRON AND TIBC
%SAT: 3 % — ABNORMAL LOW (ref 11–50)
Iron: 10 ug/dL — ABNORMAL LOW (ref 45–160)
TIBC: 376 ug/dL (ref 250–450)
UIBC: 366 ug/dL (ref 125–400)

## 2015-10-29 LAB — FERRITIN: FERRITIN: 13 ng/mL (ref 10–232)

## 2015-10-29 NOTE — Patient Instructions (Signed)
1. Please have labs to further evaluate your anemia. 2. Please have your ultrasound done. 3. We will contact you with results and further recommendations.

## 2015-10-29 NOTE — Progress Notes (Signed)
Primary Care Physician: Wende Neighbors, MD  Primary Gastroenterologist:  Garfield Cornea, MD   Chief Complaint  Patient presents with  . Abdominal Pain    HPI: [name] is a 60 y.o. female here for further evaluation of abdominal pain. She was last seen in December 2016 at time of EGD for dysphagia and abnormal barium pill esophagram. She had a somewhat baggy, tubular esophagus. Small hiatal hernia. Empirical dilation of the esophagus with the 56 French Maloney dilator. Suspected esophageal motility disorder.  Presents with 3 week history of right upper quadrant abdominal pain. Symptoms associated with meals. Postprandial upper abdominal swelling. Over the past 1 week she's had some pain radiating into her right back although notes this seems to be more musculoskeletal. Some nausea but no vomiting. Tends to do better on liquid diet. Bowel movement 2-3 times every morning to complete her stool. Stools are solid. Intermittent bright red blood per rectum here recently. About 2 weeks ago she had one week of more significant bright red blood per rectum with almost every bowel movement. Denies melena. She is on Xarelto for A. fib.  Labs done on April 7 by her PCP showed hemoglobin at 10.8, hematocrit 34.8, MCV 81.9.  Current Outpatient Prescriptions  Medication Sig Dispense Refill  . acetaminophen (TYLENOL) 500 MG tablet Take 500 mg by mouth as needed for mild pain or headache.     . allopurinol (ZYLOPRIM) 100 MG tablet Take 100 mg by mouth 2 (two) times daily.     Marland Kitchen amiodarone (PACERONE) 200 MG tablet TAKE 1 TABLET MON - FRI , TAKE 1/2 TABLET SAT- SUN 90 tablet 3  . calcium carbonate (OS-CAL) 600 MG TABS Take 600 mg by mouth 2 (two) times daily with a meal.      . cholecalciferol (VITAMIN D) 1000 UNITS tablet Take 1,000 Units by mouth daily.    . fenofibrate 160 MG tablet Take 1 tablet by mouth daily.    . furosemide (LASIX) 20 MG tablet TAKE ONE TABLET BY MOUTH DAILY AS NEEDED. 30  tablet 11  . glipiZIDE (GLUCOTROL) 10 MG tablet Take 10 mg by mouth daily before breakfast.    . levothyroxine (SYNTHROID, LEVOTHROID) 112 MCG tablet Take 125 mcg by mouth daily before breakfast.     . losartan (COZAAR) 25 MG tablet Take 12.5 mg by mouth daily.     . Magnesium 250 MG TABS Take 1 tablet by mouth daily.     . Omega-3 Fatty Acids (FISH OIL PO) Take 360 mg by mouth 2 (two) times daily.    Marland Kitchen omeprazole (PRILOSEC) 20 MG capsule TAKE ONE CAPSULE BY MOUTH TWICE DAILY ONE HOUR BEFORE FOOD IF POSSIBLE. 30 capsule 3  . potassium chloride (K-DUR) 10 MEQ tablet Take 10 mEq by mouth daily.     . Probiotic Product (ALIGN PO) Take 1 tablet by mouth daily.     Alveda Reasons 15 MG TABS tablet TAKE ONE TABLET BY MOUTH DAILY. 30 tablet 6  . zolpidem (AMBIEN CR) 12.5 MG CR tablet Take 12.5 mg by mouth at bedtime as needed for sleep.     No current facility-administered medications for this visit.    Allergies as of 10/29/2015 - Review Complete 10/29/2015  Allergen Reaction Noted  . Flecainide Nausea Only and Other (See Comments) 11/07/2013  . Hydrocodone-acetaminophen Nausea Only and Other (See Comments)   . Ibuprofen Other (See Comments) 09/05/2011  . Oxycodone hcl Nausea Only and Other (See Comments)   .  Penicillins Nausea Only and Other (See Comments)    Past Medical History  Diagnosis Date  . Microscopic colitis 9/11    Colonoscopy  . Hemorrhoids   . Diverticulosis   . Schatzki's ring     Last EGD with esophageal dilatation 41F  9/11  . Congenital heart disease   . Paroxysmal SVT (supraventricular tachycardia) ([place])   . IDA (iron deficiency anemia)   . GERD (gastroesophageal reflux disease)   . Hypothyroidism   . Hypertension   . Hyperlipidemia   . Renal insufficiency   . Gout   . PVD (peripheral vascular disease) ([place]) 09/2004    Right common femoral endarterectomy in April of 2007  . Hyperparathyroidism   . Scoliosis   . Chronic bronchitis   . Varicose veins right leg pain  and swelling  . Atrial fibrillation ([place]) Jan. 2015  . Fall due to stumbling October 08, 2013    Due to shoes  . Diabetes mellitus without complication ([place])     Type II   Past Surgical History  Procedure Laterality Date  . Asd repair  Age [facility]  . Right common femoral endarterectomy  2007  . Left parathyroidectomy  2007  . Parathyroid adenoma  2007  . Left hemithyroidectomy    . Tonsillectomy    . Hemorrhoid surgery  2010  . Colonoscopy  03/04/2010    anal canal hemorrhoids otherwise normal. TI normal. Bx showed lymphocytic colitis. next TCS 02/2020  . Esophagogastroduodenoscopy  03/04/2010    noncritical appearing Schatzki ring. SB bx negative  . Elas  06-01-11    Right saphenous ELAS   . Cardioversion N/A 11/13/2013    Procedure: CARDIOVERSION;  Surgeon: Herminio Commons, MD;  Location: [facility] ORS;  Service: Endoscopy;  Laterality: N/A;  . Esophagogastroduodenoscopy  05/2008    erosive reflux esophagatitis, noncritical Schatzki ring  . Small bowel capsule  12 2009    Mid to distal small bowel with edema, erosions, tiny ulceration felt to be NSAID related  . Colonoscopy  April 2008    Rehman: Pancolonic diverticulosis, external hemorrhoids  . Esophagogastroduodenoscopy N/A 05/28/2015    [id number] esophagus/small HH   . Esophageal dilation N/A 05/28/2015    Procedure: ESOPHAGEAL DILATION;  Surgeon: Daneil Dolin, MD;  Location: [facility] ENDO SUITE;  Service: Endoscopy;  Laterality: N/A;   Family History  Problem Relation Age of Onset  . Stroke Mother   . Heart disease Mother     CHF  - Amputation-Right Leg  . Hyperlipidemia Mother   . Hypertension Mother   . Lung cancer Father   . Cancer Father     Lung   Social History   Social History  . Marital Status: Single    Spouse Name: N/A  . Number of Children: 0  . Years of Education: N/A   Occupational History  . office worker    Social History Main Topics  . Smoking status: Never  Smoker   . Smokeless tobacco: Never Used     Comment: Never smoked  . Alcohol Use: No  . Drug Use: No  . Sexual Activity: No   Other Topics Concern  . None   Social History Narrative    ROS:  General: Negative for anorexia, weight loss, fever, chills, fatigue, weakness. ENT: Negative for hoarseness, difficulty swallowing , nasal congestion. CV: Negative for chest pain, angina, palpitations, dyspnea on exertion, peripheral edema.  Respiratory: Negative for dyspnea at rest, dyspnea  on exertion, cough, sputum, wheezing.  GI: See history of present illness. GU:  Negative for dysuria, hematuria, urinary incontinence, urinary frequency, nocturnal urination.  Endo: Negative for unusual weight change.    Physical Examination:   BP 133/78 mmHg  Pulse 77  Temp(Src) 95.8 F (35.4 C) (Oral)  Ht 4' 10.5" (1.486 m)  Wt 152 lb 3.2 oz (69.037 kg)  BMI 31.26 kg/m2  General: Well-nourished, well-developed in no acute distress.  Eyes: No icterus. Mouth: Oropharyngeal mucosa moist and pink , no lesions erythema or exudate. Lungs: Clear to auscultation bilaterally.  Heart: Regular rate and rhythm, no murmurs rubs or gallops.  Abdomen: Bowel sounds are normal, mild ruq tenderness. No Murphy's sign, nondistended, no hepatosplenomegaly or masses, no abdominal bruits or hernia , no rebound or guarding.   Extremities: No lower extremity edema. No clubbing or deformities. Neuro: Alert and oriented x 4   Skin: Warm and dry, no jaundice.   Psych: Alert and cooperative, normal mood and affect.  Labs:  Labs from 10/01/2015, outside source UN 23, creatinine 1.13, total bilirubin 0.3, alkaline phosphatase 46, AST 18, ALT 19, albumin 3.8, white blood cell count 6400, hemoglobin 10.8, hematocrit 34.8, MCV 81.9, platelets 3 and 38,000, hemoglobin A1c 6.9, TSH 5.02  Imaging Studies: No results found.

## 2015-10-29 NOTE — Assessment & Plan Note (Signed)
Normocytic anemia, unclear if this is a finding or not. Obtain iron studies. She has had some recent rectal bleeding, moderate volume per description. Last colonoscopy about 6-7 years ago. Likely pursue colonoscopy in the near future once labs are reviewed. She is on Xarelto which will need to be held for procedure. She has an upcoming point with her cardiologist because recently she was noted to be back in A. Fib.

## 2015-10-29 NOTE — Assessment & Plan Note (Signed)
60 year old female with 3 week history of postprandial right upper quadrant abdominal discomfort associated with bloating and nausea. Some right mid to upper back pain although this pain seems to be more musculoskeletal, occurring over the past one week. Gallbladder remains in situ. Recent EGD reassuring. Proceed with abdominal ultrasound for further evaluation.

## 2015-11-01 ENCOUNTER — Telehealth: Payer: Self-pay | Admitting: Internal Medicine

## 2015-11-01 MED ORDER — FLEXERIL 5 MG PO TABS
ORAL_TABLET | ORAL | Status: DC
Start: 2015-11-01 — End: 2016-11-28

## 2015-11-01 NOTE — Progress Notes (Signed)
CC'D TO PCP °

## 2015-11-01 NOTE — Telephone Encounter (Signed)
1. When in office I suspected some of her back pain musculoskeletal. I have sent in RX of Flexeril (muscle relaxer) to try. 2. abd u/s as scheduled.  3. Her labs indicate IDA, needs TCS with RMR. Hold Xarelto 48 hours. Day of prep, 1/2 dose diabetic medications.

## 2015-11-01 NOTE — Telephone Encounter (Signed)
PATIENT CALLED STATING THAT SHE IS HAVING MUSCLE SPASMS IN HER BACK RADIATING TO STOMACH AREA. WANTS TO KNOW IF [facility] CAN CALL IN A MUSCLE RELAXER     [place] 3PM [phone]

## 2015-11-01 NOTE — Progress Notes (Signed)
Quick Note:  See telephone note ______

## 2015-11-01 NOTE — Addendum Note (Signed)
Addended by: Mahala Menghini on: 11/01/2015 05:09 PM   Modules accepted: Orders

## 2015-11-01 NOTE — Telephone Encounter (Signed)
I spoke with the [name], she said she is still having the same pain that she described to [facility] but it feels like it has gotten worse. [name] is taking tylenol and using a heating pad without any relief. She wanted to know if [facility] can call in some muscle relaxer's or some pain meds for her. She had her labs done but has not done the ultrasound yet.

## 2015-11-02 ENCOUNTER — Other Ambulatory Visit: Payer: Self-pay

## 2015-11-02 DIAGNOSIS — D509 Iron deficiency anemia, unspecified: Secondary | ICD-10-CM

## 2015-11-02 MED ORDER — PEG 3350-KCL-NA BICARB-NACL 420 G PO SOLR: 4000.0000 mL | ORAL | Status: DC

## 2015-11-02 NOTE — Telephone Encounter (Signed)
LMOM to call us on what dates will work for her

## 2015-11-02 NOTE — Telephone Encounter (Signed)
PT IS SET UP FOR 11/29/15 @ 2:30

## 2015-11-02 NOTE — Telephone Encounter (Signed)
Pt is aware. She said she is already scheduled for U/S. She said it was ok to set her up for TCS.  Ginger, please schedule.

## 2015-11-02 NOTE — Telephone Encounter (Signed)
PT IS AWARE AND INSTRUCTIONS ARE IN THE MAIL AND RX SENT TO PHARMACY.

## 2015-11-05 ENCOUNTER — Ambulatory Visit (HOSPITAL_COMMUNITY)
Admission: RE | Admit: 2015-11-05 | Discharge: 2015-11-05 | Disposition: A | Discharge status: Home / Self Care | Payer: Medicare Other | Source: Ambulatory Visit | Attending: Gastroenterology | Admitting: Gastroenterology

## 2015-11-05 DIAGNOSIS — N281 Cyst of kidney, acquired: Secondary | ICD-10-CM | POA: Insufficient documentation

## 2015-11-05 DIAGNOSIS — N261 Atrophy of kidney (terminal): Secondary | ICD-10-CM | POA: Insufficient documentation

## 2015-11-05 DIAGNOSIS — D649 Anemia, unspecified: Secondary | ICD-10-CM | POA: Diagnosis not present

## 2015-11-05 DIAGNOSIS — K625 Hemorrhage of anus and rectum: Secondary | ICD-10-CM | POA: Insufficient documentation

## 2015-11-05 DIAGNOSIS — R1011 Right upper quadrant pain: Secondary | ICD-10-CM | POA: Diagnosis present

## 2015-11-05 DIAGNOSIS — R932 Abnormal findings on diagnostic imaging of liver and biliary tract: Secondary | ICD-10-CM | POA: Insufficient documentation

## 2015-11-08 NOTE — Progress Notes (Signed)
Quick Note:  Likely fatty liver. Both kidneys somewhat small with renal insufficiency (h/o). Does she see kidney specialist already?  For ruq pain, would recommend hida with fatty meal challenge to complete gallbladder work up.   She will need follow up ov in 3 months for "fatty liver, ?cirrhosis by u/s" ______

## 2015-11-11 ENCOUNTER — Other Ambulatory Visit: Payer: Self-pay

## 2015-11-30 ENCOUNTER — Encounter (HOSPITAL_COMMUNITY): Payer: Self-pay | Admitting: *Deleted

## 2015-11-30 ENCOUNTER — Encounter (HOSPITAL_COMMUNITY)
Admission: RE | Disposition: A | Discharge status: Home / Self Care | Payer: Self-pay | Source: Ambulatory Visit | Attending: Internal Medicine

## 2015-11-30 ENCOUNTER — Ambulatory Visit (HOSPITAL_COMMUNITY)
Admission: RE | Admit: 2015-11-30 | Discharge: 2015-11-30 | Disposition: A | Discharge status: Home / Self Care | Payer: Medicare Other | Source: Ambulatory Visit | Attending: Internal Medicine | Admitting: Internal Medicine

## 2015-11-30 DIAGNOSIS — D509 Iron deficiency anemia, unspecified: Secondary | ICD-10-CM | POA: Diagnosis not present

## 2015-11-30 DIAGNOSIS — E785 Hyperlipidemia, unspecified: Secondary | ICD-10-CM | POA: Insufficient documentation

## 2015-11-30 DIAGNOSIS — K640 First degree hemorrhoids: Secondary | ICD-10-CM | POA: Diagnosis not present

## 2015-11-30 DIAGNOSIS — Z7901 Long term (current) use of anticoagulants: Secondary | ICD-10-CM | POA: Insufficient documentation

## 2015-11-30 DIAGNOSIS — E119 Type 2 diabetes mellitus without complications: Secondary | ICD-10-CM | POA: Diagnosis not present

## 2015-11-30 DIAGNOSIS — N289 Disorder of kidney and ureter, unspecified: Secondary | ICD-10-CM | POA: Diagnosis not present

## 2015-11-30 DIAGNOSIS — E039 Hypothyroidism, unspecified: Secondary | ICD-10-CM | POA: Insufficient documentation

## 2015-11-30 DIAGNOSIS — K219 Gastro-esophageal reflux disease without esophagitis: Secondary | ICD-10-CM | POA: Insufficient documentation

## 2015-11-30 DIAGNOSIS — R1011 Right upper quadrant pain: Secondary | ICD-10-CM | POA: Insufficient documentation

## 2015-11-30 DIAGNOSIS — K921 Melena: Secondary | ICD-10-CM | POA: Diagnosis not present

## 2015-11-30 DIAGNOSIS — K5730 Diverticulosis of large intestine without perforation or abscess without bleeding: Secondary | ICD-10-CM | POA: Insufficient documentation

## 2015-11-30 DIAGNOSIS — I4891 Unspecified atrial fibrillation: Secondary | ICD-10-CM | POA: Insufficient documentation

## 2015-11-30 DIAGNOSIS — Z7984 Long term (current) use of oral hypoglycemic drugs: Secondary | ICD-10-CM | POA: Insufficient documentation

## 2015-11-30 HISTORY — PX: COLONOSCOPY: SHX5424

## 2015-11-30 LAB — GLUCOSE, CAPILLARY: GLUCOSE-CAPILLARY: 82 mg/dL (ref 65–99)

## 2015-11-30 SURGERY — COLONOSCOPY
Anesthesia: Moderate Sedation

## 2015-11-30 MED ORDER — SODIUM CHLORIDE 0.9 % IV SOLN
INTRAVENOUS | Status: DC
  Administered 2015-11-30: 10:00:00 via INTRAVENOUS

## 2015-11-30 MED ORDER — MIDAZOLAM HCL 5 MG/5ML IJ SOLN
INTRAMUSCULAR | Status: DC | PRN
  Administered 2015-11-30: 1 mg via INTRAVENOUS
  Administered 2015-11-30: 2 mg via INTRAVENOUS
  Administered 2015-11-30: 1 mg via INTRAVENOUS

## 2015-11-30 MED ORDER — ONDANSETRON HCL 4 MG/2ML IJ SOLN
INTRAMUSCULAR | Status: AC
  Filled 2015-11-30: qty 2

## 2015-11-30 MED ORDER — STERILE WATER FOR IRRIGATION IR SOLN
Status: DC | PRN
  Administered 2015-11-30: 2.5 mL

## 2015-11-30 MED ORDER — MEPERIDINE HCL 100 MG/ML IJ SOLN
INTRAMUSCULAR | Status: DC | PRN
  Administered 2015-11-30: 50 mg via INTRAVENOUS
  Administered 2015-11-30: 25 mg via INTRAVENOUS

## 2015-11-30 MED ORDER — ONDANSETRON HCL 4 MG/2ML IJ SOLN
INTRAMUSCULAR | Status: DC | PRN
  Administered 2015-11-30: 4 mg via INTRAVENOUS

## 2015-11-30 MED ORDER — MIDAZOLAM HCL 5 MG/5ML IJ SOLN
INTRAMUSCULAR | Status: AC
  Filled 2015-11-30: qty 10

## 2015-11-30 MED ORDER — MEPERIDINE HCL 100 MG/ML IJ SOLN
INTRAMUSCULAR | Status: AC
  Filled 2015-11-30: qty 2

## 2015-11-30 NOTE — Op Note (Signed)
[facility] [address] Patient Name: [name] Procedure Date: 11/30/2015 10:36 AM MRN: [id number] Date of Birth: 08-20-1955 Attending MD: Norvel Richards , MD CSN: [id number] Age: 60 Admit Type: Outpatient Procedure:                Ileocolonoscopy?"diagnostic Indications:              Iron deficiency anemia; intermittent ochezia Providers:                Norvel Richards, MD, Lurline Del, RN, Isabella Stalling, Technician Referring MD:              Medicines:                Midazolam 4 mg IV, Meperidine 75 mg IV, Ondansetron                            4 mg IV Complications:            No immediate complications. Estimated Blood Loss:     Estimated blood loss: none. Procedure:                Pre-Anesthesia Assessment:                           - Prior to the procedure, a History and Physical                            was performed, and patient medications and                            allergies were reviewed. The patient's tolerance of                            previous anesthesia was also reviewed. The risks                            and benefits of the procedure and the sedation                            options and risks were discussed with the patient.                            All questions were answered, and informed consent                            was obtained. Prior Anticoagulants: The patient has                            taken no previous anticoagulant or antiplatelet                            agents. ASA Grade Assessment: II - A patient with  mild systemic disease. After reviewing the risks                            and benefits, the patient was deemed in                            satisfactory condition to undergo the procedure.                           After obtaining informed consent, the colonoscope                            was passed under direct vision. Throughout the   procedure, the patient's blood pressure, pulse, and                            oxygen saturations were monitored continuously. The                            [id number] scope was introduced through                            the anus and advanced to the 10 cm into the ileum.                            The colonoscopy was performed without difficulty.                            The patient tolerated the procedure well. The                            quality of the bowel preparation was adequate. The                            terminal ileum, ileocecal valve, appendiceal                            orifice, and rectum were photographed. Scope In: 10:51:47 AM Scope Out: 11:08:49 AM Scope Withdrawal Time: 0 hours 10 minutes 56 seconds  Total Procedure Duration: 0 hours 17 minutes 2 seconds  Findings:      The perianal and digital rectal examinations were normal.      Non-bleeding internal hemorrhoids were found during retroflexion. The       hemorrhoids were medium-sized and Grade I (internal hemorrhoids that do       not prolapse).      Bilobed ileocecal valve. This was closely inspected. This is a variant       of normal.      No other significant abnormalities were identified in a careful       examination of the remainder of the colon. The terminal ileum appeared       normal for 10 cm. Impression:               - Non-bleeding internal hemorrhoids. Colonic  diverticulosis. I suspect bleeding seen may be                            related to hemorrhoids but may not totally explain                            iron deficiency.                           - No specimens collected. Moderate Sedation:      Moderate (conscious) sedation was administered by the endoscopy nurse       and supervised by the endoscopist. The following parameters were       monitored: oxygen saturation, heart rate, blood pressure, respiratory       rate, EKG, adequacy of pulmonary  ventilation, and response to care.       Total physician intraservice time was 22 minutes. Recommendation:           - Patient has a contact number available for                            emergencies. The signs and symptoms of potential                            delayed complications were discussed with the                            patient. Return to normal activities tomorrow.                            Written discharge instructions were provided to the                            patient.                           - Advance diet as tolerated.                           - Continue present medications.                           - Repeat colonoscopy in 10 years for screening                            purposes.                           - Return to GI office in 4 weeks. Patient may be a                            reasonably good candidate for hemorrhoid banding.                           - Continue present medications. Procedure Code(s):        --- Professional ---  [phone], Colonoscopy, flexible; diagnostic, including                            collection of specimen(s) by brushing or washing,                            when performed (separate procedure)                           99152, Moderate sedation services provided by the                            same physician or other qualified health care                            professional performing the diagnostic or                            therapeutic service that the sedation supports,                            requiring the presence of an independent trained                            observer to assist in the monitoring of the                            patient's level of consciousness and physiological                            status; initial 15 minutes of intraservice time,                            patient age 74 years or older Diagnosis Code(s):        --- Professional ---                            K64.0, First degree hemorrhoids                           D50.9, Iron deficiency anemia, unspecified CPT copyright 2016 American Medical Association. All rights reserved. The codes documented in this report are preliminary and upon coder review may  be revised to meet current compliance requirements. Cristopher Estimable. Geriann Lafont, MD Norvel Richards, MD 11/30/2015 11:20:00 AM This report has been signed electronically. Number of Addenda: 0

## 2015-11-30 NOTE — Discharge Instructions (Signed)
Colonoscopy Discharge Instructions  Read the instructions outlined below and refer to this sheet in the next few weeks. These discharge instructions provide you with general information on caring for yourself after you leave the hospital. Your doctor may also give you specific instructions. While your treatment has been planned according to the most current medical practices available, unavoidable complications occasionally occur. If you have any problems or questions after discharge, call Dr. Gala Romney at [phone]. ACTIVITY  You may resume your regular activity, but move at a slower pace for the next 24 hours.   Take frequent rest periods for the next 24 hours.   Walking will help get rid of the air and reduce the bloated feeling in your belly (abdomen).   No driving for 24 hours (because of the medicine (anesthesia) used during the test).    Do not sign any important legal documents or operate any machinery for 24 hours (because of the anesthesia used during the test).  NUTRITION  Drink plenty of fluids.   You may resume your normal diet as instructed by your doctor.   Begin with a light meal and progress to your normal diet. Heavy or fried foods are harder to digest and may make you feel sick to your stomach (nauseated).   Avoid alcoholic beverages for 24 hours or as instructed.  MEDICATIONS  You may resume your normal medications unless your doctor tells you otherwise.  WHAT YOU CAN EXPECT TODAY  Some feelings of bloating in the abdomen.   Passage of more gas than usual.   Spotting of blood in your stool or on the toilet paper.  IF YOU HAD POLYPS REMOVED DURING THE COLONOSCOPY:  No aspirin products for 7 days or as instructed.   No alcohol for 7 days or as instructed.   Eat a soft diet for the next 24 hours.  FINDING OUT THE RESULTS OF YOUR TEST Not all test results are available during your visit. If your test results are not back during the visit, make an appointment  with your caregiver to find out the results. Do not assume everything is normal if you have not heard from your caregiver or the medical facility. It is important for you to follow up on all of your test results.  SEEK IMMEDIATE MEDICAL ATTENTION IF:  You have more than a spotting of blood in your stool.   Your belly is swollen (abdominal distention).   You are nauseated or vomiting.   You have a temperature over 101.   You have abdominal pain or discomfort that is severe or gets worse throughout the day.    Diverticulosis information provided  Office visit with [place] to further evaluate iron deficiency anemia in 4-6 weeks  Diverticulosis Diverticulosis is the condition that develops when small pouches (diverticula) form in the wall of your colon. Your colon, or large intestine, is where water is absorbed and stool is formed. The pouches form when the inside layer of your colon pushes through weak spots in the outer layers of your colon. CAUSES  No one knows exactly what causes diverticulosis. RISK FACTORS  Being older than 9. Your risk for this condition increases with age. Diverticulosis is rare in people younger than 40 years. By age 55, almost everyone has it.  Eating a low-fiber diet.  Being frequently constipated.  Being overweight.  Not getting enough exercise.  Smoking.  Taking over-the-counter pain medicines, like aspirin and ibuprofen. SYMPTOMS  Most people with diverticulosis do not have symptoms. DIAGNOSIS  Because diverticulosis often has no symptoms, health care providers often discover the condition during an exam for other colon problems. In many cases, a health care provider will diagnose diverticulosis while using a flexible scope to examine the colon (colonoscopy). TREATMENT  If you have never developed an infection related to diverticulosis, you may not need treatment. If you have had an infection before, treatment may include:  Eating more fruits,  vegetables, and grains.  Taking a fiber supplement.  Taking a live bacteria supplement (probiotic).  Taking medicine to relax your colon. HOME CARE INSTRUCTIONS   Drink at least 6-8 glasses of water each day to prevent constipation.  Try not to strain when you have a bowel movement.  Keep all follow-up appointments. If you have had an infection before:  Increase the fiber in your diet as directed by your health care provider or dietitian.  Take a dietary fiber supplement if your health care provider approves.  Only take medicines as directed by your health care provider. SEEK MEDICAL CARE IF:   You have abdominal pain.  You have bloating.  You have cramps.  You have not gone to the bathroom in 3 days. SEEK IMMEDIATE MEDICAL CARE IF:   Your pain gets worse.  Yourbloating becomes very bad.  You have a fever or chills, and your symptoms suddenly get worse.  You begin vomiting.  You have bowel movements that are bloody or black. MAKE SURE YOU:  Understand these instructions.  Will watch your condition.  Will get help right away if you are not doing well or get worse.   This information is not intended to replace advice given to you by your health care provider. Make sure you discuss any questions you have with your health care provider.   Document Released: 03/09/2004 Document Revised: 06/17/2013 Document Reviewed: 05/07/2013 Elsevier Interactive Patient Education [employer].

## 2015-11-30 NOTE — Interval H&P Note (Signed)
History and Physical Interval Note:  11/30/2015 10:39 AM  [name]  has presented today for surgery, with the diagnosis of IDA  The various methods of treatment have been discussed with the patient and family. After consideration of risks, benefits and other options for treatment, the patient has consented to  Procedure(s) with comments: COLONOSCOPY (N/A) - 230 - moved to 6/6 @ 12:45 - moved to 11:00 - pt knows to arrive at 10:00  as a surgical intervention .  The patient's history has been reviewed, patient examined, no change in status, stable for surgery.  I have reviewed the patient's chart and labs.  Questions were answered to the patient's satisfaction.     Annmargaret Decaprio  No change. Diagnostic colonoscopy per plan.The risks, benefits, limitations, alternatives and imponderables have been reviewed with the patient. Questions have been answered. All parties are agreeable.

## 2015-11-30 NOTE — H&P (View-Only) (Signed)
Quick Note:  See telephone note ______

## 2015-12-01 ENCOUNTER — Encounter (HOSPITAL_COMMUNITY): Payer: Self-pay | Admitting: Internal Medicine

## 2015-12-03 NOTE — Interval H&P Note (Signed)
History and Physical Interval Note:  12/03/2015 12:23 PM  [name]  has presented today for surgery, with the diagnosis of IDA  The various methods of treatment have been discussed with the patient and family. After consideration of risks, benefits and other options for treatment, the patient has consented to  Procedure(s) with comments: COLONOSCOPY (N/A) - 230 - moved to 6/6 @ 12:45 - moved to 11:00 - pt knows to arrive at 10:00  as a surgical intervention .  The patient's history has been reviewed, patient examined, no change in status, stable for surgery.  I have reviewed the patient's chart and labs.  Questions were answered to the patient's satisfaction.     Manus Rudd

## 2015-12-03 NOTE — Interval H&P Note (Signed)
History and Physical Interval Note:  12/03/2015 12:23 PM  [name]  has presented today for surgery, with the diagnosis of IDA  The various methods of treatment have been discussed with the patient and family. After consideration of risks, benefits and other options for treatment, the patient has consented to  Procedure(s) with comments: COLONOSCOPY (N/A) - 230 - moved to 6/6 @ 12:45 - moved to 11:00 - pt knows to arrive at 10:00  as a surgical intervention .  The patient's history has been reviewed, patient examined, no change in status, stable for surgery.  I have reviewed the patient's chart and labs.  Questions were answered to the patient's satisfaction.     [name]  No change.

## 2015-12-03 NOTE — H&P (View-Only) (Signed)
Quick Note:  Likely fatty liver. Both kidneys somewhat small with renal insufficiency (h/o). Does she see kidney specialist already?  For ruq pain, would recommend hida with fatty meal challenge to complete gallbladder work up.   She will need follow up ov in 3 months for "fatty liver, ?cirrhosis by u/s" ______

## 2015-12-10 ENCOUNTER — Other Ambulatory Visit: Payer: Self-pay | Admitting: Nurse Practitioner

## 2016-01-03 ENCOUNTER — Telehealth: Payer: Self-pay | Admitting: Internal Medicine

## 2016-01-03 ENCOUNTER — Encounter (INDEPENDENT_AMBULATORY_CARE_PROVIDER_SITE_OTHER): Payer: Self-pay

## 2016-01-03 ENCOUNTER — Ambulatory Visit (HOSPITAL_COMMUNITY)
Admission: RE | Admit: 2016-01-03 | Discharge: 2016-01-03 | Disposition: A | Discharge status: Home / Self Care | Payer: Medicare Other | Source: Ambulatory Visit | Attending: Internal Medicine | Admitting: Internal Medicine

## 2016-01-03 ENCOUNTER — Ambulatory Visit (INDEPENDENT_AMBULATORY_CARE_PROVIDER_SITE_OTHER): Payer: Medicare Other | Admitting: Internal Medicine

## 2016-01-03 ENCOUNTER — Encounter: Payer: Self-pay | Admitting: Internal Medicine

## 2016-01-03 VITALS — BP 125/80 | HR 83 | Ht <= 58 in | Wt 152.0 lb

## 2016-01-03 DIAGNOSIS — Z79899 Other long term (current) drug therapy: Secondary | ICD-10-CM | POA: Diagnosis present

## 2016-01-03 DIAGNOSIS — R918 Other nonspecific abnormal finding of lung field: Secondary | ICD-10-CM | POA: Diagnosis not present

## 2016-01-03 NOTE — Progress Notes (Signed)
HPI [name] returns today for followup. She is a pleasant 60 yo woman with a h/o atrial fib with an RVR who underwent DCCV and initiation of fairly high dose amiodarone therapy after failing AA Rx with rhythmol. She appears to have maintained NSR. She was referred to consider atrial fib ablation but she is not inclined to undergo this procedure. She denies chest pain or sob. No syncope. She has been on fairly low dose amio in the last year. She has had some anemia and this is undergoing work up. She remains on her low dose xarelto. No obvious bleeding. Allergies  Allergen Reactions  . Flecainide Nausea Only and Other (See Comments)    Faint feeling  . Hydrocodone-Acetaminophen Nausea Only and Other (See Comments)    Severe headache  . Ibuprofen Other (See Comments)    Kidney dysfunction  . Oxycodone Hcl Nausea Only and Other (See Comments)    Headache  . Penicillins Nausea Only and Other (See Comments)    Severe headache     Current Outpatient Prescriptions  Medication Sig Dispense Refill  . acetaminophen (TYLENOL) 500 MG tablet Take 500 mg by mouth as needed for mild pain or headache.     . allopurinol (ZYLOPRIM) 100 MG tablet Take 100 mg by mouth 2 (two) times daily.     Marland Kitchen amiodarone (PACERONE) 200 MG tablet TAKE 1 TABLET MON - FRI , TAKE 1/2 TABLET SAT- SUN 90 tablet 3  . calcium carbonate (OS-CAL) 600 MG TABS Take 600 mg by mouth 2 (two) times daily with a meal.      . cholecalciferol (VITAMIN D) 1000 UNITS tablet Take 1,000 Units by mouth daily.    . ferrous sulfate 325 (65 FE) MG tablet Take 325 mg by mouth daily with breakfast.    . FLEXERIL 5 MG tablet Use 5 to 10mg  up to three times daily as needed for spasms 60 tablet 0  . furosemide (LASIX) 20 MG tablet TAKE ONE TABLET BY MOUTH DAILY AS NEEDED. 30 tablet 11  . glipiZIDE (GLUCOTROL) 10 MG tablet Take 10 mg by mouth daily before breakfast.    . levothyroxine (SYNTHROID, LEVOTHROID) 125 MCG tablet Take 125 mcg by  mouth daily before breakfast.    . Magnesium 250 MG TABS Take 1 tablet by mouth daily.     . Omega-3 Fatty Acids (FISH OIL PO) Take 360 mg by mouth 2 (two) times daily.    Marland Kitchen omeprazole (PRILOSEC) 20 MG capsule TAKE ONE CAPSULE BY MOUTH TWICE DAILY ONE HOUR BEFORE FOOD IF POSSIBLE. 30 capsule 5  . potassium chloride (K-DUR) 10 MEQ tablet Take 10 mEq by mouth daily.     . Probiotic Product (ALIGN PO) Take 1 tablet by mouth daily.     Alveda Reasons 15 MG TABS tablet TAKE ONE TABLET BY MOUTH DAILY. 30 tablet 6  . zolpidem (AMBIEN CR) 12.5 MG CR tablet Take 12.5 mg by mouth at bedtime as needed for sleep.     No current facility-administered medications for this visit.     Past Medical History  Diagnosis Date  . Microscopic colitis 9/11    Colonoscopy  . Hemorrhoids   . Diverticulosis   . Schatzki's ring     Last EGD with esophageal dilatation 48F  9/11  . Congenital heart disease   . Paroxysmal SVT (supraventricular tachycardia) ([place])   . IDA (iron deficiency anemia)   . GERD (gastroesophageal reflux disease)   . Hypothyroidism   .  Hypertension   . Hyperlipidemia   . Renal insufficiency   . Gout   . PVD (peripheral vascular disease) ([place]) 09/2004    Right common femoral endarterectomy in April of 2007  . Hyperparathyroidism   . Scoliosis   . Chronic bronchitis   . Varicose veins right leg pain and swelling  . Atrial fibrillation ([place]) Jan. 2015  . Fall due to stumbling October 08, 2013    Due to shoes  . Diabetes mellitus without complication ([place])     Type II    ROS:   All systems reviewed and negative except as noted in the HPI.   Past Surgical History  Procedure Laterality Date  . Asd repair  Age [facility]  . Right common femoral endarterectomy  2007  . Left parathyroidectomy  2007  . Parathyroid adenoma  2007  . Left hemithyroidectomy    . Tonsillectomy    . Hemorrhoid surgery  2010  . Colonoscopy  03/04/2010    anal canal  hemorrhoids otherwise normal. TI normal. Bx showed lymphocytic colitis. next TCS 02/2020  . Esophagogastroduodenoscopy  03/04/2010    noncritical appearing Schatzki ring. SB bx negative  . Elas  06-01-11    Right saphenous ELAS   . Cardioversion N/A 11/13/2013    Procedure: CARDIOVERSION;  Surgeon: Herminio Commons, MD;  Location: [facility] ORS;  Service: Endoscopy;  Laterality: N/A;  . Esophagogastroduodenoscopy  05/2008    erosive reflux esophagatitis, noncritical Schatzki ring  . Small bowel capsule  12 2009    Mid to distal small bowel with edema, erosions, tiny ulceration felt to be NSAID related  . Colonoscopy  April 2008    Rehman: Pancolonic diverticulosis, external hemorrhoids  . Esophagogastroduodenoscopy N/A 05/28/2015    [id number] esophagus/small HH   . Esophageal dilation N/A 05/28/2015    Procedure: ESOPHAGEAL DILATION;  Surgeon: Daneil Dolin, MD;  Location: [facility] SU[facility];  Service: Endoscopy;  Laterality: N/A;  . Colonoscopy N/A 11/30/2015    Procedure: COLONOSCOPY;  Surgeon: Daneil Dolin, MD;  Location: [facility] SU[facility];  Service: Endoscopy;  Laterality: N/A;  230 - moved to 6/6 @ 12:45 - moved to 11:00 - pt knows to arrive at 10:00      Family History  Problem Relation Age of Onset  . Stroke Mother   . Heart disease Mother     CHF  - Amputation-Right Leg  . Hyperlipidemia Mother   . Hypertension Mother   . Lung cancer Father   . Cancer Father     Lung     Social History   Social History  . Marital Status: Single    Spouse Name: N/A  . Number of Children: 0  . Years of Education: N/A   Occupational History  . office worker    Social History Main Topics  . Smoking status: Never Smoker   . Smokeless tobacco: Never Used     Comment: Never smoked  . Alcohol Use: No  . Drug Use: No  . Sexual Activity: No   Other Topics Concern  . Not on file   Social History Narrative     BP 125/80 mmHg  Pulse 83  Ht 4\' 10"  (1.473 m)  Wt 152 lb (68.947 kg)  BMI  31.78 kg/m2  SpO2 98%  Physical Exam:  Well appearing 60 yo woman, NAD HEENT: Unremarkable Neck:  6 cm JVD, no thyromegally Back:  No CVA tenderness Lungs:  Clear with  no wheezes HEART:  Regular brady rhythm, no murmurs, no rubs, no clicks Abd:  soft, positive bowel sounds, no organomegally, no rebound, no guarding Ext:  2 plus pulses, no edema, no cyanosis, no clubbing Skin:  No rashes no nodules Neuro:  CN II through XII intact, motor grossly intact  Assess/Plan: 1. Atrial fib - she is maintaining NSR on low dose amio. She is still not inclined to undergo atrial fib ablation. She will continue amio and will check a CXR for amio screening. 2. HTN - her blood pressure is controlled. She will continue her current meds. 3. Anemia - workup ongoing. If she needs she can stop the xarelto. 4. Chronic diastolic heart failure - she is much better when she is in rhythm. She is encouraged to maintain a low sodium diet.  Mikle Bosworth.D.

## 2016-01-03 NOTE — Patient Instructions (Signed)
Your physician wants you to follow-up in: 1 Year with Dr. Lovena Le. You will receive a reminder letter in the mail two months in advance. If you don't receive a letter, please call our office to schedule the follow-up appointment.  Your physician recommends that you continue on your current medications as directed. Please refer to the Current Medication list given to you today.  Have Chest X-ray done.  If you need a refill on your cardiac medications before your next appointment, please call your pharmacy.  Thank you for choosing [facility]!

## 2016-01-03 NOTE — Telephone Encounter (Signed)
New message     The pt is in the waiting room, at [facility] radiology and Dr. Lovena Le did not e-sign the order for the chest X-Ray and they ned someone to e-sign the order.

## 2016-01-03 NOTE — Telephone Encounter (Signed)
Attempted to call [facility] radiology - NA.  Called the [place] office - GT is still there.  According to Jarrett Soho the issue has been taken care of.

## 2016-01-06 ENCOUNTER — Other Ambulatory Visit: Payer: Self-pay

## 2016-01-06 DIAGNOSIS — R9389 Abnormal findings on diagnostic imaging of other specified body structures: Secondary | ICD-10-CM

## 2016-01-07 LAB — SEDIMENTATION RATE: Sed Rate: 14 mm/hr (ref 0–30)

## 2016-01-11 ENCOUNTER — Encounter: Payer: Self-pay | Admitting: Gastroenterology

## 2016-01-11 ENCOUNTER — Ambulatory Visit (INDEPENDENT_AMBULATORY_CARE_PROVIDER_SITE_OTHER): Payer: Medicare Other | Admitting: Gastroenterology

## 2016-01-11 VITALS — BP 128/82 | HR 70 | Temp 96.0°F | Ht 58.5 in | Wt 150.6 lb

## 2016-01-11 DIAGNOSIS — D509 Iron deficiency anemia, unspecified: Secondary | ICD-10-CM | POA: Insufficient documentation

## 2016-01-11 NOTE — Patient Instructions (Signed)
1. I will review your most recent labs from Dr. Juel Burrow office, further recommendations to follow regarding whether or not you need to have the small bowel capsule done.

## 2016-01-11 NOTE — Assessment & Plan Note (Signed)
Obtain most recent labs from PCP. She has a history of IDA proximal 8 years ago requiring EGD, colonoscopy, Givens capsule. Was on NSAIDs at that time which was felt to be the source of her small bowel erosions/tiny ulcerations at that time. Now with recurrent IDA the setting of Xarelto, no significant findings on recent colonoscopy. She had EGD within the past 9 months as outlined in past surgical history. Cannot rule out the possibility of needing repeat Givens capsule endoscopy. Further recommendations to follow review of labs.  In addition her ultrasound earlier this year suggested fatty liver and/or cirrhosis due to "Coarse hepatic echotexture with hyperechoic hepatic parenchyma." Spleen was not enlarged. No mention of nodular border. Lab parameters not consistent with cirrhosis. No evidence of varices or portal hypertension on recent EGD. Would consider reimaging in one year via abd u/s with elastography.

## 2016-01-11 NOTE — Progress Notes (Signed)
Primary Care Physician: Wende Neighbors, MD  Primary Gastroenterologist:  Garfield Cornea, MD   Chief Complaint  Patient presents with  . Follow-up    Feeling better    HPI: [name] is a 60 y.o. female here For follow-up. Seen back in May with right upper quadrant abdominal pain related to meals. Abdominal ultrasound with likely fatty liver cannot rule out early cirrhosis. Offered HIDA scan fatty meal challenge to complete gallbladder workup that she wanted to wait to schedule this. Also noted to have microcytic anemia, new from labs 2 years ago. Hemoglobin is 7.3, MCV 79.9, platelets 310,000, iron 10, TIBC 366, and her saturations 3%, ferritin 13. Ileocolonoscopy for iron deficiency anemia and hematochezia done on 11/30/2015 showed nonbleeding internal hemorrhoids, grade 1. Terminal ileum normal for 10 cm, colonic diverticulosis. She was felt to be a reasonable candidate for hemorrhoid banding.  Patient has been on Xarelto since 2015. She reports bright red blood per rectum on the tissue only intermittently, may go couple weeks at a time without seeing any. Ascribes a small volume. No melena. Denies abdominal pain, constipation, diarrhea. No upper GI symptoms at this time. No menstrual cycles in decades. No other obvious overt bleeding to explain iron deficiency anemia. History of renal insufficiency.         Current Outpatient Prescriptions  Medication Sig Dispense Refill  . acetaminophen (TYLENOL) 500 MG tablet Take 500 mg by mouth as needed for mild pain or headache.     . allopurinol (ZYLOPRIM) 100 MG tablet Take 100 mg by mouth 2 (two) times daily.     Marland Kitchen amiodarone (PACERONE) 200 MG tablet TAKE 1 TABLET MON - FRI , TAKE 1/2 TABLET SAT- SUN 90 tablet 3  . calcium carbonate (OS-CAL) 600 MG TABS Take 600 mg by mouth 2 (two) times daily with a meal.      . cholecalciferol (VITAMIN D) 1000 UNITS tablet Take 1,000 Units by mouth daily.    . ferrous sulfate 325 (65 FE) MG tablet  Take 325 mg by mouth daily with breakfast.    . FLEXERIL 5 MG tablet Use 5 to 10mg  up to three times daily as needed for spasms 60 tablet 0  . furosemide (LASIX) 20 MG tablet TAKE ONE TABLET BY MOUTH DAILY AS NEEDED. 30 tablet 11  . glipiZIDE (GLUCOTROL) 10 MG tablet Take 10 mg by mouth daily before breakfast.    . levothyroxine (SYNTHROID, LEVOTHROID) 125 MCG tablet Take 125 mcg by mouth daily before breakfast.    . Magnesium 250 MG TABS Take 1 tablet by mouth daily.     . Omega-3 Fatty Acids (FISH OIL PO) Take 360 mg by mouth 2 (two) times daily.    Marland Kitchen omeprazole (PRILOSEC) 20 MG capsule TAKE ONE CAPSULE BY MOUTH TWICE DAILY ONE HOUR BEFORE FOOD IF POSSIBLE. 30 capsule 5  . potassium chloride (K-DUR) 10 MEQ tablet Take 10 mEq by mouth daily.     . Probiotic Product (ALIGN PO) Take 1 tablet by mouth daily.     Alveda Reasons 15 MG TABS tablet TAKE ONE TABLET BY MOUTH DAILY. 30 tablet 6  . zolpidem (AMBIEN CR) 12.5 MG CR tablet Take 12.5 mg by mouth at bedtime as needed for sleep.     No current facility-administered medications for this visit.    Allergies as of 01/11/2016 - Review Complete 01/11/2016  Allergen Reaction Noted  . Flecainide Nausea Only and Other (See Comments) 11/07/2013  . Hydrocodone-acetaminophen Nausea  Only and Other (See Comments)   . Ibuprofen Other (See Comments) 09/05/2011  . Oxycodone hcl Nausea Only and Other (See Comments)   . Penicillins Nausea Only and Other (See Comments)    Past Surgical History  Procedure Laterality Date  . Asd repair  Age [facility]  . Right common femoral endarterectomy  2007  . Left parathyroidectomy  2007  . Parathyroid adenoma  2007  . Left hemithyroidectomy    . Tonsillectomy    . Hemorrhoid surgery  2010  . Colonoscopy  03/04/2010    anal canal hemorrhoids otherwise normal. TI normal. Bx showed lymphocytic colitis. next TCS 02/2020  . Esophagogastroduodenoscopy  03/04/2010    noncritical  appearing Schatzki ring. SB bx negative  . Elas  06-01-11    Right saphenous ELAS   . Cardioversion N/A 11/13/2013    Procedure: CARDIOVERSION;  Surgeon: Herminio Commons, MD;  Location: [facility] ORS;  Service: Endoscopy;  Laterality: N/A;  . Esophagogastroduodenoscopy  05/2008    erosive reflux esophagatitis, noncritical Schatzki ring  . Small bowel capsule  12 2009    Mid to distal small bowel with edema, erosions, tiny ulceration felt to be NSAID related  . Colonoscopy  April 2008    Rehman: Pancolonic diverticulosis, external hemorrhoids  . Esophagogastroduodenoscopy N/A 05/28/2015    [id number] esophagus/small HH   . Esophageal dilation N/A 05/28/2015    Procedure: ESOPHAGEAL DILATION;  Surgeon: Daneil Dolin, MD;  Location: [facility];  Service: Endoscopy;  Laterality: N/A;  . Colonoscopy N/A 11/30/2015    RMR: Normal terminal ileum for 10 cm, nonbleeding grade 1 internal hemorrhoids, colonic diverticulosis. Next colonoscopy in 2027.    ROS:  General: Negative for anorexia, weight loss, fever, chills, fatigue, weakness. ENT: Negative for hoarseness, difficulty swallowing , nasal congestion. CV: Negative for chest pain, angina, palpitations, dyspnea on exertion, peripheral edema.  Respiratory: Negative for dyspnea at rest, dyspnea on exertion, cough, sputum, wheezing.  GI: See history of present illness. GU:  Negative for dysuria, hematuria, urinary incontinence, urinary frequency, nocturnal urination.  Endo: Negative for unusual weight change.    Physical Examination:   BP 128/82 mmHg  Pulse 70  Temp(Src) 96 F (35.6 C) (Oral)  Ht 4' 10.5" (1.486 m)  Wt 150 lb 9.6 oz (68.312 kg)  BMI 30.94 kg/m2  General: Well-nourished, well-developed in no acute distress.  Eyes: No icterus. Mouth: Oropharyngeal mucosa moist and pink , no lesions erythema or exudate. Lungs: Clear to auscultation bilaterally.  Heart: Regular rate and rhythm, no murmurs rubs or gallops.  Abdomen: Bowel  sounds are normal, nontender, nondistended, no hepatosplenomegaly or masses, no abdominal bruits or hernia , no rebound or guarding.   Extremities: No lower extremity edema. No clubbing or deformities. Neuro: Alert and oriented x 4   Skin: Warm and dry, no jaundice.   Psych: Alert and cooperative, normal mood and affect.  Labs:    Lab Results  Component Value Date   WBC 6.9 10/29/2015   HGB 10.3* 10/29/2015   HCT 33.8* 10/29/2015   MCV 79.9* 10/29/2015   PLT 310 10/29/2015   Lab Results  Component Value Date   IRON 10* 10/29/2015   TIBC 376 10/29/2015   FERRITIN 13 10/29/2015   Lab Results  Component Value Date   ALT 19 11/06/2013   AST 34 11/06/2013   ALKPHOS 31* 11/06/2013   BILITOT 1.1 11/06/2013    Imaging Studies: Dg Chest 2 View  01/04/2016  CLINICAL DATA:  Chronic amiodarone therapy, productive cough for the past 6 months, congenital heart disease, diabetes EXAM: CHEST  2 VIEW COMPARISON:  Chest x-ray of Nov 12, 2013 and September 06, 2011. FINDINGS: The lungs are adequately inflated. The interstitial markings are mildly prominent and slightly more conspicuous overall today. The cardiac silhouette is enlarged but stable. The pulmonary vascularity is normal. There is moderate levocurvature centered in the mid to lower thoracic spine which is stable. There surgical clips in the paratracheal region in the lower neck. IMPRESSION: Mild interstitial prominence bilaterally slightly more conspicuous than in May of 2015 and March 2013. This may be related to amiodarone therapy. There is stable enlargement of the cardiac silhouette without significant pulmonary vascular congestion. Electronically Signed   By: David  Martinique M.D.   On: 01/04/2016 08:37

## 2016-01-11 NOTE — Progress Notes (Signed)
CC'ED TO PCP 

## 2016-02-03 ENCOUNTER — Ambulatory Visit: Payer: Medicare Other | Admitting: Gastroenterology

## 2016-02-03 NOTE — Progress Notes (Signed)
Please let patient know that I failed to mention to her at [place]. She will need abd u/s with elastography 10/2016 for follow up fatty liver.

## 2016-02-04 ENCOUNTER — Other Ambulatory Visit: Payer: Self-pay | Admitting: Internal Medicine

## 2016-02-04 DIAGNOSIS — I480 Paroxysmal atrial fibrillation: Secondary | ICD-10-CM

## 2016-02-09 NOTE — Progress Notes (Signed)
Letter mailed to the pt.  Please nic U/S

## 2016-02-10 NOTE — Progress Notes (Signed)
ON RECALL  °

## 2016-06-28 DIAGNOSIS — I10 Essential (primary) hypertension: Secondary | ICD-10-CM | POA: Diagnosis not present

## 2016-06-28 DIAGNOSIS — R809 Proteinuria, unspecified: Secondary | ICD-10-CM | POA: Diagnosis not present

## 2016-06-28 DIAGNOSIS — D509 Iron deficiency anemia, unspecified: Secondary | ICD-10-CM | POA: Diagnosis not present

## 2016-06-28 DIAGNOSIS — N183 Chronic kidney disease, stage 3 (moderate): Secondary | ICD-10-CM | POA: Diagnosis not present

## 2016-06-30 DIAGNOSIS — F5101 Primary insomnia: Secondary | ICD-10-CM | POA: Diagnosis not present

## 2016-06-30 DIAGNOSIS — A499 Bacterial infection, unspecified: Secondary | ICD-10-CM | POA: Diagnosis not present

## 2016-06-30 DIAGNOSIS — J060 Acute laryngopharyngitis: Secondary | ICD-10-CM | POA: Diagnosis not present

## 2016-07-04 ENCOUNTER — Other Ambulatory Visit: Payer: Self-pay | Admitting: Internal Medicine

## 2016-07-05 DIAGNOSIS — N250 Renal osteodystrophy: Secondary | ICD-10-CM | POA: Diagnosis not present

## 2016-07-05 DIAGNOSIS — I509 Heart failure, unspecified: Secondary | ICD-10-CM | POA: Diagnosis not present

## 2016-07-05 DIAGNOSIS — N184 Chronic kidney disease, stage 4 (severe): Secondary | ICD-10-CM | POA: Diagnosis not present

## 2016-07-05 DIAGNOSIS — R809 Proteinuria, unspecified: Secondary | ICD-10-CM | POA: Diagnosis not present

## 2016-07-14 NOTE — Progress Notes (Signed)
Reviewed labs sent from PCP dated 01/2016.  H/h 13/41.7, MCV 86.3   NIC for OV 10/2016

## 2016-07-17 DIAGNOSIS — N184 Chronic kidney disease, stage 4 (severe): Secondary | ICD-10-CM | POA: Diagnosis not present

## 2016-07-17 DIAGNOSIS — I482 Chronic atrial fibrillation: Secondary | ICD-10-CM | POA: Diagnosis not present

## 2016-07-17 DIAGNOSIS — R002 Palpitations: Secondary | ICD-10-CM | POA: Diagnosis not present

## 2016-07-17 DIAGNOSIS — R0602 Shortness of breath: Secondary | ICD-10-CM | POA: Diagnosis not present

## 2016-07-17 NOTE — Progress Notes (Signed)
ON RECALL  °

## 2016-07-25 ENCOUNTER — Encounter: Payer: Self-pay | Admitting: Internal Medicine

## 2016-07-27 ENCOUNTER — Other Ambulatory Visit (HOSPITAL_COMMUNITY): Payer: Self-pay | Admitting: Internal Medicine

## 2016-07-27 DIAGNOSIS — Z1231 Encounter for screening mammogram for malignant neoplasm of breast: Secondary | ICD-10-CM

## 2016-08-10 ENCOUNTER — Encounter (HOSPITAL_COMMUNITY): Payer: Self-pay

## 2016-08-10 ENCOUNTER — Ambulatory Visit (HOSPITAL_COMMUNITY)
Admission: RE | Admit: 2016-08-10 | Discharge: 2016-08-10 | Disposition: A | Discharge status: Home / Self Care | Payer: PPO | Source: Ambulatory Visit | Attending: Internal Medicine | Admitting: Internal Medicine

## 2016-08-10 DIAGNOSIS — Z1231 Encounter for screening mammogram for malignant neoplasm of breast: Secondary | ICD-10-CM

## 2016-08-17 NOTE — Progress Notes (Signed)
Cardiology Office Note   Date:  08/18/2016   ID:  [name], DOB [birth year], MRN [id number]  PCP:  Wende Neighbors, MD  Cardiologist: Taylor/  Jory Sims, NP   Chief Complaint  Patient presents with  . Atrial Fibrillation      History of Present Illness: [name] is a 61 y.o. female who presents for ongoing assessment and management of atrial fib with RVR, s/p DCCV, started on amiodarone. She was last seen by Dr. Lovena Le 01/03/2016. She was continued on Xarelto and amiodarone. She is also being worked up for anemia.   She comes today without any cardiac complaints. Occasionally feels her heart rate go up but not often. When questioning about whether or not she has any bruising bleeding or hemoptysis, she states that she has bleeding each time she has a bowel movement. So much so that it drips on the floor after she cleans herself. She denies any worsening shortness of breath or chest pain. She was last seen in July 2017 by GI.    Past Medical History:  Diagnosis Date  . Atrial fibrillation ([place]) Jan. 2015  . Chronic bronchitis   . Congenital heart disease   . Diabetes mellitus without complication ([place])    Type II  . Diverticulosis   . Fall due to stumbling October 08, 2013   Due to shoes  . GERD (gastroesophageal reflux disease)   . Gout   . Hemorrhoids   . Hyperlipidemia   . Hyperparathyroidism   . Hypertension   . Hypothyroidism   . IDA (iron deficiency anemia)   . Microscopic colitis 9/11   Colonoscopy  . Paroxysmal SVT (supraventricular tachycardia) ([place])   . PVD (peripheral vascular disease) ([place]) 09/2004   Right common femoral endarterectomy in April of 2007  . Renal insufficiency   . Schatzki's ring    Last EGD with esophageal dilatation 3F  9/11  . Scoliosis   . Varicose veins right leg pain and swelling    Past Surgical History:  Procedure Laterality Date  . ASD REPAIR  Age 30   [facility]  . CARDIOVERSION N/A  11/13/2013   Procedure: CARDIOVERSION;  Surgeon: Herminio Commons, MD;  Location: [facility] ORS;  Service: Endoscopy;  Laterality: N/A;  . COLONOSCOPY  03/04/2010   anal canal hemorrhoids otherwise normal. TI normal. Bx showed lymphocytic colitis. next TCS 02/2020  . COLONOSCOPY  April 2008   [place]hman: Pancolonic diverticulosis, external hemorrhoids  . COLONOSCOPY N/A 11/30/2015   RMR: Normal terminal ileum for 10 cm, nonbleeding grade 1 internal hemorrhoids, colonic diverticulosis. Next colonoscopy in 2027.  Marland Kitchen ELAS  06-01-11   Right saphenous ELAS   . ESOPHAGEAL DILATION N/A 05/28/2015   Procedure: ESOPHAGEAL DILATION;  Surgeon: Daneil Dolin, MD;  Location: [facility] ENDO SUITE;  Service: Endoscopy;  Laterality: N/A;  . ESOPHAGOGASTRODUODENOSCOPY  03/04/2010   noncritical appearing Schatzki ring. SB bx negative  . ESOPHAGOGASTRODUODENOSCOPY  05/2008   erosive reflux esophagatitis, noncritical Schatzki ring  . ESOPHAGOGASTRODUODENOSCOPY N/A 05/28/2015   [id number] esophagus/small HH   . [place]  2010  . Left hemithyroidectomy    . Left parathyroidectomy  2007  . Parathyroid adenoma  2007  . Right common femoral endarterectomy  2007  . Small bowel capsule  12 2009   Mid to distal small bowel with edema, erosions, tiny ulceration felt to be NSAID related  . TONSILLECTOMY       Current Outpatient Prescriptions  Medication Sig Dispense  Refill  . acetaminophen (TYLENOL) 500 MG tablet Take 500 mg by mouth as needed for mild pain or headache.     . allopurinol (ZYLOPRIM) 100 MG tablet Take 100 mg by mouth 2 (two) times daily.     Marland Kitchen amiodarone (PACERONE) 200 MG tablet TAKE 1 TABLET BY MOUTH MON-FRI AND 1/2 TABLET ON SAT-SUN. 72 tablet 6  . calcium carbonate (OS-CAL) 600 MG TABS Take 600 mg by mouth 2 (two) times daily with a meal.      . cholecalciferol (VITAMIN D) 1000 UNITS tablet Take 1,000 Units by mouth daily.    . ferrous sulfate 325 (65 FE) MG tablet Take 325 mg by mouth daily with  breakfast.    . FLEXERIL 5 MG tablet Use 5 to 10mg  up to three times daily as needed for spasms 60 tablet 0  . furosemide (LASIX) 20 MG tablet TAKE ONE TABLET BY MOUTH DAILY AS NEEDED. 30 tablet 11  . glipiZIDE (GLUCOTROL) 10 MG tablet Take 10 mg by mouth daily before breakfast.    . levothyroxine (SYNTHROID, LEVOTHROID) 125 MCG tablet Take 125 mcg by mouth daily before breakfast.    . Magnesium 250 MG TABS Take 1 tablet by mouth daily.     . Omega-3 Fatty Acids (FISH OIL PO) Take 360 mg by mouth 2 (two) times daily.    . potassium chloride (K-DUR) 10 MEQ tablet Take 10 mEq by mouth daily.     . Probiotic Product (ALIGN PO) Take 1 tablet by mouth daily.      No current facility-administered medications for this visit.     Allergies:   Flecainide; Hydrocodone-acetaminophen; Ibuprofen; Oxycodone hcl; and Penicillins    Social History:  The patient  reports that she has never smoked. She has never used smokeless tobacco. She reports that she does not drink alcohol or use drugs.   Family History:  The patient's family history includes Cancer in her father; Heart disease in her mother; Hyperlipidemia in her mother; Hypertension in her mother; Lung cancer in her father; Stroke in her mother.    ROS: All other systems are reviewed and negative. Unless otherwise mentioned in H&P    PHYSICAL EXAM: VS:  BP 130/88   Pulse 79   Ht 4' 10.5" (1.486 m)   Wt 148 lb (67.1 kg)   SpO2 96%   BMI 30.41 kg/m  , BMI Body mass index is 30.41 kg/m. GEN: Well nourished, well developed, in no acute distress  HEENT: normal  Neck: no JVD, carotid bruits, or masses Cardiac: RRR; no murmurs, rubs, or gallops,no edema  Respiratory:  clear to auscultation bilaterally, normal work of breathing GI: Some tenderness in the lower quadrant bilaterally. Normal bowel sounds. MS: no deformity or atrophy  Skin: warm and dry, no rash Neuro:  Strength and sensation are intact Psych: euthymic mood, full affect  Recent  Labs: 08/18/2016: Hemoglobin 13.3; Platelets 281    Lipid Panel No results found for: CHOL, TRIG, HDL, CHOLHDL, VLDL, LDLCALC, LDLDIRECT    Wt Readings from Last 3 Encounters:  08/18/16 148 lb (67.1 kg)  01/11/16 150 lb 9.6 oz (68.3 kg)  01/03/16 152 lb (68.9 kg)    Echocardiogram 01/05/2014 Left ventricle: The cavity size was normal. Wall thickness was increased in a pattern of moderate LVH. There is mild SAM of the anterior mitral valve leaflet in the setting of moderate symetric hypertrophy, there is mild velocity acceleration but no significant subvaulvar gradient. Systolic function was normal. The estimated ejection  fraction was in the range of 50% to 55%. Abnormal diastolic function, indeterminant grade. There is evidence of severely elevated LA pressure (E/e&' 20) - Aortic valve: Mildly calcified annulus. Mildly thickened leaflets. - Mitral valve: Mildly calcified annulus. Mildly thickened leaflets . - Left atrium: The atrium was severely dilated. - Right ventricle: The cavity size was mildly dilated. Systolic function was normal. RV TAPSE is 2.2 cm. - Right atrium: The atrium was moderately dilated. - Atrial septum: No defect or patent foramen ovale was identified. - Pulmonary arteries: PA peak pressure: 36 mm Hg (S). PASP is borderline elevated.    ASSESSMENT AND PLAN:  1.  Atrial fibrillation:  Heart rate is currently well controlled. Because of active bright red bleeding with bowel movements I am going to take her off her Xarelto for now. Continue regular medication. This includes amiodarone.  2. Self-reported bright red bleeding after bowel movement: I have spoken with Walden Field, nurse practitioner, with GI. There are office is closed this afternoon. They will be giving her a call on Monday morning, 08/21/2016 to have her seen in their office.  I have advised the patient if she experiences chest pain, dyspnea, dizziness, or significant  bleeding with or without bowel movement she is to report to the emergency room. In the interim I have checked a CBC. Hemoglobin 13.3, hematocrit 41.5, white blood cells 7.6, platelets 281. This is an improvement from recent CBC completed on 10/29/2015 with a hemoglobin of 10.3 and hematocrit of 33.0.  3. Hypertension: Blood pressure is well-controlled.  4. Chronic anemia: The patient is on iron replacement. CBC does not indicate worsening anemia despite self-reported bright red blood in stool.    Current medicines are reviewed at length with the patient today.    Labs/ tests ordered today include:   Orders Placed This Encounter  Procedures  . EKG 12-Lead     Disposition:   FU with cardiology after GI visit. Will need to start back on Xarelto as soon as possible after their evaluation.   Signed, Jory Sims, NP  08/18/2016 4:47 PM    [facility] [address] [place], [place] [address] Phone: [phone]; Fax: [phone]

## 2016-08-18 ENCOUNTER — Encounter: Payer: Self-pay | Admitting: Adult Health

## 2016-08-18 ENCOUNTER — Ambulatory Visit (INDEPENDENT_AMBULATORY_CARE_PROVIDER_SITE_OTHER): Payer: PPO | Admitting: Adult Health

## 2016-08-18 ENCOUNTER — Other Ambulatory Visit (HOSPITAL_COMMUNITY)
Admission: RE | Admit: 2016-08-18 | Discharge: 2016-08-18 | Disposition: A | Discharge status: Home / Self Care | Payer: PPO | Source: Ambulatory Visit | Attending: Adult Health | Admitting: Adult Health

## 2016-08-18 VITALS — BP 130/88 | HR 79 | Ht 58.5 in | Wt 148.0 lb

## 2016-08-18 DIAGNOSIS — K625 Hemorrhage of anus and rectum: Secondary | ICD-10-CM

## 2016-08-18 DIAGNOSIS — I4891 Unspecified atrial fibrillation: Secondary | ICD-10-CM | POA: Diagnosis not present

## 2016-08-18 LAB — BASIC METABOLIC PANEL
ANION GAP: 8 (ref 5–15)
BUN: 26 mg/dL — ABNORMAL HIGH (ref 6–20)
CHLORIDE: 102 mmol/L (ref 101–111)
CO2: 27 mmol/L (ref 22–32)
Calcium: 9.5 mg/dL (ref 8.9–10.3)
Creatinine, Ser: 1.39 mg/dL — ABNORMAL HIGH (ref 0.44–1.00)
GFR calc Af Amer: 47 mL/min — ABNORMAL LOW (ref >60)
GFR calc non Af Amer: 40 mL/min — ABNORMAL LOW (ref >60)
Glucose, Bld: 107 mg/dL — ABNORMAL HIGH (ref 65–99)
POTASSIUM: 3.7 mmol/L (ref 3.5–5.1)
SODIUM: 137 mmol/L (ref 135–145)

## 2016-08-18 LAB — CBC
HEMATOCRIT: 41.5 % (ref 36.0–46.0)
HEMOGLOBIN: 13.3 g/dL (ref 12.0–15.0)
MCH: 31.1 pg (ref 26.0–34.0)
MCHC: 32 g/dL (ref 30.0–36.0)
MCV: 97.2 fL (ref 78.0–100.0)
Platelets: 281 10*3/uL (ref 150–400)
RBC: 4.27 MIL/uL (ref 3.87–5.11)
RDW: 14.7 % (ref 11.5–15.5)
WBC: 7.6 10*3/uL (ref 4.0–10.5)

## 2016-08-18 NOTE — Progress Notes (Signed)
Name: [name]    DOB: [birth year]  Age: 61 y.o.  MR#: [id number]       PCP:  Wende Neighbors, MD      Insurance: Payor: [employer] / Plan: Tenn[employer] Must / Product Type: *No Product type* /   CC:   No chief complaint on file.   VS Vitals:   08/18/16 1413  BP: 130/88  Pulse: 79  SpO2: 96%  Weight: 148 lb (67.1 kg)  Height: 4' 10.5" (1.486 m)    Weights Current Weight  08/18/16 148 lb (67.1 kg)  01/11/16 150 lb 9.6 oz (68.3 kg)  01/03/16 152 lb (68.9 kg)    Blood Pressure  BP Readings from Last 3 Encounters:  08/18/16 130/88  01/11/16 128/82  01/03/16 125/80     Admit date:  (Not on file) Last encounter with RMR:  Visit date not found   Allergy Flecainide; Hydrocodone-acetaminophen; Ibuprofen; Oxycodone hcl; and Penicillins  Current Outpatient Prescriptions  Medication Sig Dispense Refill  . acetaminophen (TYLENOL) 500 MG tablet Take 500 mg by mouth as needed for mild pain or headache.     . allopurinol (ZYLOPRIM) 100 MG tablet Take 100 mg by mouth 2 (two) times daily.     Marland Kitchen amiodarone (PACERONE) 200 MG tablet TAKE 1 TABLET BY MOUTH MON-FRI AND 1/2 TABLET ON SAT-SUN. 72 tablet 6  . calcium carbonate (OS-CAL) 600 MG TABS Take 600 mg by mouth 2 (two) times daily with a meal.      . cholecalciferol (VITAMIN D) 1000 UNITS tablet Take 1,000 Units by mouth daily.    . ferrous sulfate 325 (65 FE) MG tablet Take 325 mg by mouth daily with breakfast.    . FLEXERIL 5 MG tablet Use 5 to 10mg  up to three times daily as needed for spasms 60 tablet 0  . furosemide (LASIX) 20 MG tablet TAKE ONE TABLET BY MOUTH DAILY AS NEEDED. 30 tablet 11  . glipiZIDE (GLUCOTROL) 10 MG tablet Take 10 mg by mouth daily before breakfast.    . levothyroxine (SYNTHROID, LEVOTHROID) 125 MCG tablet Take 125 mcg by mouth daily before breakfast.    . Magnesium 250 MG TABS Take 1 tablet by mouth daily.     . Omega-3 Fatty Acids (FISH OIL PO) Take 360 mg by mouth 2 (two) times daily.    .  potassium chloride (K-DUR) 10 MEQ tablet Take 10 mEq by mouth daily.     . Probiotic Product (ALIGN PO) Take 1 tablet by mouth daily.     Alveda Reasons 15 MG TABS tablet TAKE ONE TABLET BY MOUTH DAILY. 30 tablet 0   No current facility-administered medications for this visit.     Discontinued Meds:    Medications Discontinued During This Encounter  Medication Reason  . omeprazole (PRILOSEC) 20 MG capsule Error  . zolpidem (AMBIEN CR) 12.5 MG CR tablet Error    Patient Active Problem List   Diagnosis Date Noted  . IDA (iron deficiency anemia) 01/11/2016  . Diverticulosis of colon without hemorrhage   . Abdominal pain, right upper quadrant 10/29/2015  . Rectal bleeding 10/29/2015  . Normocytic anemia 10/29/2015  . Dysphagia   . Abnormality of esophagus 04/29/2015  . Esophageal dysphagia 03/26/2015  . OSA (obstructive sleep apnea) 12/31/2013  . Atrial fibrillation with RVR ([place]) 11/09/2013  . Acute diastolic CHF (congestive heart failure) ([place]) 11/09/2013  . CKD (chronic kidney disease), stage III 11/09/2013  . CKD (chronic kidney disease) 11/07/2013  . Hyperkalemia 11/07/2013  .  Diastolic dysfunction 16/03/9603  . DM (diabetes mellitus) ([place]) 11/07/2013  . CHF (congestive heart failure) ([place]) 11/07/2013  . Varicose veins of lower extremities with ulcer ([place]) 10/17/2013  . Cold-Left Hand / Foot 10/08/2013  . Circulation problem-Left foot 10/08/2013  . Atherosclerosis of native arteries of the extremities with ulceration(440.23) 10/08/2013  . A-fib ([place]) 08/05/2013  . Unspecified constipation 04/11/2013  . Peripheral vascular disease, unspecified 10/27/2011  . Varicose veins of lower extremities with other complications 54/02/8118  . COLLAGENOUS COLITIS 04/08/2010  . GERD 02/18/2010  . HYPERTHYROIDISM 02/11/2010    LABS    Component Value Date/Time   NA 137 11/15/2013 0500   NA 138 11/12/2013 0604   NA 138 11/11/2013 0554   NA 141 07/29/2010   K 4.5 11/15/2013 0500   K  4.4 11/12/2013 0604   K 4.0 11/11/2013 0554   K 3.6 07/29/2010   CL 98 11/15/2013 0500   CL 96 11/12/2013 0604   CL 93 (L) 11/11/2013 0554   CL 103 07/29/2010   CO2 27 11/15/2013 0500   CO2 27 11/12/2013 0604   CO2 35 (H) 11/11/2013 0554   CO2 25 07/29/2010   GLUCOSE 115 (H) 11/15/2013 0500   GLUCOSE 112 (H) 11/12/2013 0604   GLUCOSE 108 (H) 11/11/2013 0554   BUN 34 (H) 11/15/2013 0500   BUN 39 (H) 11/12/2013 0604   BUN 39 (H) 11/11/2013 0554   BUN 16 07/29/2010   CREATININE 1.69 (H) 11/15/2013 0500   CREATININE 1.75 (H) 11/12/2013 0604   CREATININE 1.85 (H) 11/11/2013 0554   CREATININE 1.76 (H) 08/26/2013 0816   CREATININE 1.41 (H) 04/30/2013 1002   CREATININE 1.21 07/29/2010   CALCIUM 9.5 11/15/2013 0500   CALCIUM 9.6 11/12/2013 0604   CALCIUM 10.2 11/11/2013 0554   CALCIUM 9.4 07/29/2010   GFRNONAA 33 (L) 11/15/2013 0500   GFRNONAA 31 (L) 11/12/2013 0604   GFRNONAA 29 (L) 11/11/2013 0554   GFRNONAA 32 (L) 08/26/2013 0816   GFRAA 38 (L) 11/15/2013 0500   GFRAA 36 (L) 11/12/2013 0604   GFRAA 34 (L) 11/11/2013 0554   GFRAA 36 (L) 08/26/2013 0816   CMP     Component Value Date/Time   NA 137 11/15/2013 0500   NA 141 07/29/2010   K 4.5 11/15/2013 0500   K 3.6 07/29/2010   CL 98 11/15/2013 0500   CL 103 07/29/2010   CO2 27 11/15/2013 0500   CO2 25 07/29/2010   GLUCOSE 115 (H) 11/15/2013 0500   BUN 34 (H) 11/15/2013 0500   BUN 16 07/29/2010   CREATININE 1.69 (H) 11/15/2013 0500   CREATININE 1.76 (H) 08/26/2013 0816   CALCIUM 9.5 11/15/2013 0500   CALCIUM 9.4 07/29/2010   PROT 7.4 11/06/2013 2126   ALBUMIN 3.6 11/06/2013 2126   AST 34 11/06/2013 2126   ALT 19 11/06/2013 2126   ALKPHOS 31 (L) 11/06/2013 2126   BILITOT 1.1 11/06/2013 2126   GFRNONAA 33 (L) 11/15/2013 0500   GFRNONAA 32 (L) 08/26/2013 0816   GFRAA 38 (L) 11/15/2013 0500   GFRAA 36 (L) 08/26/2013 0816       Component Value Date/Time   WBC 6.9 10/29/2015 1033   WBC 9.2 11/12/2013 0604   WBC  8.5 11/06/2013 2126   HGB 10.3 (L) 10/29/2015 1033   HGB 13.2 11/12/2013 0604   HGB 13.9 11/06/2013 2126   HCT 33.8 (L) 10/29/2015 1033   HCT 42.0 11/12/2013 0604   HCT 43.6 11/06/2013 2126   MCV 79.9 (L)  10/29/2015 1033   MCV 88.8 11/12/2013 0604   MCV 89.5 11/06/2013 2126    Lipid Panel  No results found for: CHOL, TRIG, HDL, CHOLHDL, VLDL, LDLCALC, LDLDIRECT  ABG No results found for: PHART, PCO2ART, PO2ART, HCO3, TCO2, ACIDBASEDEF, O2SAT   Lab Results  Component Value Date   TSH 6.640 (H) 11/07/2013   BNP (last 3 results) No results for input(s): BNP in the last 8760 hours.  ProBNP (last 3 results) No results for input(s): PROBNP in the last 8760 hours.  Cardiac Panel (last 3 results) No results for input(s): CKTOTAL, CKMB, TROPONINI, RELINDX in the last 72 hours.  Iron/TIBC/Ferritin/ %Sat    Component Value Date/Time   IRON 10 (L) 10/29/2015 1033   TIBC 376 10/29/2015 1033   FERRITIN 13 10/29/2015 1033   IRONPCTSAT 3 (L) 10/29/2015 1033     EKG Orders placed or performed in visit on 08/18/16  . EKG 12-Lead     Prior Assessment and Plan Problem List as of 08/18/2016 Reviewed: 01/11/2016  9:45 AM by Neil Crouch, PA-C     Cardiovascular and Mediastinum   Varicose veins of lower extremities with other complications   Peripheral vascular disease, unspecified   A-fib [facility])   Last Assessment & Plan 02/25/2015 Office Visit Written 02/25/2015  1:27 PM by Evans Lance, MD    She is maintaining NSR. She will continue her current meds and reduce her amio to 200 mg Mon-Fri and 100 mg Sat.and Sunday.       Circulation problem-Left foot   Varicose veins of lower extremities with ulcer ([place])   CHF (congestive heart failure) [facility])   Last Assessment & Plan 12/15/2013 Office Visit Written 12/15/2013  4:12 PM by Lendon Colonel, NP    No evidence of fluid retention currently. She has been having issues of breathing. Her weight is stable.      Atrial fibrillation with RVR  [facility])   Last Assessment & Plan 12/15/2013 Office Visit Written 12/15/2013  4:12 PM by Lendon Colonel, NP    Remains in normal sinus rhythm but very bradycardic with a rate of 45 beats per minute. PR interval is 0.186 ms. There are no AV blocks noted. The patient is on amiodarone 400 mg twice a day, she has not been taking any Rythmol. The patient is having significant dizziness, and actually fell forward injuring her face and lip, without losing consciousness, but was presyncopal.  I have called and spoken with Dr. Crissie Sickles by phone over at [facility] with questions concerning going ahead and decreasing the amiodarone down to 400 mg as he had recommended on last office visit. I also want to place a monitor on the patient for couple of weeks to evaluate her heart rate control, along with rhythm disturbances.  After speaking with Dr. Lovena Le, he wants to decrease her amiodarone down to 200 mg daily. He is in agreement with the cardiac monitor. This will be placed on for the next 2 weeks. Hopefully this will be read by followup appointment with Dr. Rayann Heman in 3 weeks. This has been explained to the patient who verbalizes understanding and is willing to wear the monitor. She is report any further instances of passing out, near-syncope, or rapid heart rhythm.      Acute diastolic CHF (congestive heart failure) [facility])   Last Assessment & Plan 02/25/2015 Office Visit Written 02/25/2015  1:28 PM by Evans Lance, MD    Her symptoms are class 2A. She will continue  her current meds.        Respiratory   Cold-Left Hand / Foot   OSA (obstructive sleep apnea)   Last Assessment & Plan 10/16/2014 Office Visit Written 10/16/2014  2:26 PM by Kathee Delton, MD    The patient has a history of mild obstructive sleep apnea, and has been intolerant of sleep apnea. I have talked with her about weight loss as well as a dental appliance as alternative treatments for her sleep apnea. She would like to work on weight  reduction for now, and will let me know if she would like referral to dental medicine        Digestive   GERD   Last Assessment & Plan 04/11/2013 Office Visit Written 04/11/2013 10:43 AM by Mahala Menghini, PA-C    Continue omeprazole 20 mg twice a day. Anti-reflex measures. Try to lose at least 5 pounds in the next couple of months.      Unspecified constipation   Last Assessment & Plan 04/11/2013 Office Visit Written 04/11/2013 10:44 AM by Mahala Menghini, PA-C    You may take MiraLax 1 capful at bedtime on days you do not have adequate bowel movement.      Esophageal dysphagia   Last Assessment & Plan 04/29/2015 Office Visit Written 05/01/2015 10:32 PM by Mahala Menghini, PA-C    61 y/o female with pill, solid food dysphagia. Recent abnormal barium esophagus as outlined above. Discussed with Dr. Gala Romney. Plan on EGD/ED in near future.  I have discussed the risks, alternatives, benefits with regards to but not limited to the risk of reaction to medication, bleeding, infection, perforation and the patient is agreeable to proceed. Written consent to be obtained.  Hold Xarelto 48 hours before procedure. Discussed with Dr. Lovena Le.       Abnormality of esophagus   Dysphagia   Rectal bleeding   Diverticulosis of colon without hemorrhage     Endocrine   HYPERTHYROIDISM   DM (diabetes mellitus) ([place])     Genitourinary   CKD (chronic kidney disease)   CKD (chronic kidney disease), stage III     Other   COLLAGENOUS COLITIS   Last Assessment & Plan 05/09/2011 Office Visit Edited 05/09/2011  5:22 PM by Andria Meuse, NP    Doing well. Continue Align daily      Atherosclerosis of native arteries of the extremities with ulceration(440.23)   Hyperkalemia   Diastolic dysfunction   Abdominal pain, right upper quadrant   Last Assessment & Plan 10/29/2015 Office Visit Written 10/29/2015 10:52 AM by Mahala Menghini, PA-C    61 year old female with 3 week history of postprandial right upper  quadrant abdominal discomfort associated with bloating and nausea. Some right mid to upper back pain although this pain seems to be more musculoskeletal, occurring over the past one week. Gallbladder remains in situ. Recent EGD reassuring. Proceed with abdominal ultrasound for further evaluation.      Normocytic anemia   Last Assessment & Plan 10/29/2015 Office Visit Written 10/29/2015 10:54 AM by Mahala Menghini, PA-C    Normocytic anemia, unclear if this is a finding or not. Obtain iron studies. She has had some recent rectal bleeding, moderate volume per description. Last colonoscopy about 6-7 years ago. Likely pursue colonoscopy in the near future once labs are reviewed. She is on Xarelto which will need to be held for procedure. She has an upcoming point with her cardiologist because recently she was noted to be back in A.  Fib.       IDA (iron deficiency anemia)   Last Assessment & Plan 01/11/2016 Office Visit Written 01/11/2016 12:07 PM by Mahala Menghini, PA-C    Obtain most recent labs from PCP. She has a history of IDA proximal 8 years ago requiring EGD, colonoscopy, Givens capsule. Was on NSAIDs at that time which was felt to be the source of her small bowel erosions/tiny ulcerations at that time. Now with recurrent IDA the setting of Xarelto, no significant findings on recent colonoscopy. She had EGD within the past 9 months as outlined in past surgical history. Cannot rule out the possibility of needing repeat Givens capsule endoscopy. Further recommendations to follow review of labs.  In addition her ultrasound earlier this year suggested fatty liver and/or cirrhosis due to "Coarse hepatic echotexture with hyperechoic hepatic parenchyma." Spleen was not enlarged. No mention of nodular border. Lab parameters not consistent with cirrhosis. No evidence of varices or portal hypertension on recent EGD. Would consider reimaging in one year via abd u/s with elastography.          Imaging: Mm  Screening Breast Tomo Bilateral  Result Date: 08/10/2016 CLINICAL DATA:  Screening. EXAM: 2D DIGITAL SCREENING BILATERAL MAMMOGRAM WITH CAD AND ADJUNCT TOMO COMPARISON:  Previous exam(s). ACR Breast Density Category b: There are scattered areas of fibroglandular density. FINDINGS: There are no findings suspicious for malignancy. Images were processed with CAD. IMPRESSION: No mammographic evidence of malignancy. A result letter of this screening mammogram will be mailed directly to the patient. RECOMMENDATION: Screening mammogram in one year. (Code:SM-B-01Y) BI-RADS CATEGORY  1: Negative. Electronically Signed   By: Fidela Salisbury M.D.   On: 08/10/2016 17:25

## 2016-08-18 NOTE — Patient Instructions (Signed)
Your physician recommends that you schedule a follow-up appointment in: to be determined after GI Consult, they will call you on Monday with an apt date.    STOP Xarelto    Go to the ED if you have increased rectal bleeding,dizziness, or chest pain    Get labs NOW      Thank you for choosing [facility] !

## 2016-08-21 ENCOUNTER — Telehealth: Payer: Self-pay | Admitting: *Deleted

## 2016-08-21 ENCOUNTER — Ambulatory Visit (INDEPENDENT_AMBULATORY_CARE_PROVIDER_SITE_OTHER): Payer: PPO | Admitting: Gastroenterology

## 2016-08-21 ENCOUNTER — Encounter: Payer: Self-pay | Admitting: Gastroenterology

## 2016-08-21 DIAGNOSIS — K625 Hemorrhage of anus and rectum: Secondary | ICD-10-CM

## 2016-08-21 DIAGNOSIS — K649 Unspecified hemorrhoids: Secondary | ICD-10-CM | POA: Insufficient documentation

## 2016-08-21 DIAGNOSIS — K5900 Constipation, unspecified: Secondary | ICD-10-CM

## 2016-08-21 MED ORDER — HYDROCORTISONE 2.5 % RE CREA: 1.0000 "application " | TOPICAL_CREAM | Freq: Two times a day (BID) | RECTAL | 0 refills | Status: DC

## 2016-08-21 NOTE — Patient Instructions (Addendum)
1. Use MiraLAX once daily to keep stools soft and avoid straining. Let me know if you continue having abdominal discomfort.  2. Start Proctozone anorectally twice daily for next 2 weeks. May repeat X 2 more weeks if needed. 3. I will discuss bleeding with Dr. Gala Wright and we will let you know further recommendations. I suspect you are bleeding from hemorrhoids. We will also update cardiology on today's visit.

## 2016-08-21 NOTE — Progress Notes (Signed)
Primary Care Physician: Tamara Neighbors, MD  Primary Gastroenterologist:  Tamara Cornea, MD   Chief Complaint  Patient presents with  . Blood In Stools    has been bright red, stools are black (takes Iron)  . Constipation    better since taking Miralax  . Abdominal Pain    lower abd     HPI: [name] is a 61 y.o. female here at the request of Tamara Sims, NP for further evaluation rectal bleeding in the setting of Xarelto. Patient was seen by her last Friday and mentioned having frequent rectal bleeding. She sees it several days per week. Sometimes enough that it drips on the toilet and on the floor when she is trying to clean up. Described as thin, fresh blood. She has known hemorrhoids. Feels like her hemorrhoids have gotten bigger. She has problems with constipation and at times passing hard stools. About one week ago she skipped a couple of days without a bowel movement and it "like to kill me". [name] MiraLAX when she remembers it. Last week she's had some lower abdominal discomfort, griped when she tried to get out of bed. Seems to be better when she takes MiraLAX. No upper GI complaints. Stools are somewhat dark but she is on iron. No UGI complaints.   XARELTO on hold since 08/18/2016.  Hemoglobin 13.3 on 08/18/2016 stable from August 2017.   Current Outpatient Prescriptions  Medication Sig Dispense Refill  . acetaminophen (TYLENOL) 500 MG tablet Take 500 mg by mouth as needed for mild pain or headache.     . allopurinol (ZYLOPRIM) 100 MG tablet Take 100 mg by mouth 2 (two) times daily.     Marland Kitchen amiodarone (PACERONE) 200 MG tablet TAKE 1 TABLET BY MOUTH MON-FRI AND 1/2 TABLET ON SAT-SUN. 72 tablet 6  . calcium carbonate (OS-CAL) 600 MG TABS Take 600 mg by mouth 2 (two) times daily with a meal.      . cholecalciferol (VITAMIN D) 1000 UNITS tablet Take 1,000 Units by mouth daily.    . ferrous sulfate 325 (65 FE) MG tablet Take 325 mg by mouth daily with breakfast.     . FLEXERIL 5 MG tablet Use 5 to 10mg  up to three times daily as needed for spasms 60 tablet 0  . furosemide (LASIX) 20 MG tablet TAKE ONE TABLET BY MOUTH DAILY AS NEEDED. 30 tablet 11  . glipiZIDE (GLUCOTROL) 10 MG tablet Take 10 mg by mouth daily before breakfast.    . levothyroxine (SYNTHROID, LEVOTHROID) 125 MCG tablet Take 125 mcg by mouth daily before breakfast.    . Magnesium 250 MG TABS Take 1 tablet by mouth daily.     . Omega-3 Fatty Acids (FISH OIL PO) Take 360 mg by mouth 2 (two) times daily.    . potassium chloride (K-DUR) 10 MEQ tablet Take 10 mEq by mouth daily.     . Probiotic Product (ALIGN PO) Take 1 tablet by mouth daily.      No current facility-administered medications for this visit.     Allergies as of 08/21/2016 - Review Complete 08/21/2016  Allergen Reaction Noted  . Flecainide Nausea Only and Other (See Comments) 11/07/2013  . Hydrocodone-acetaminophen Nausea Only and Other (See Comments)   . Ibuprofen Other (See Comments) 09/05/2011  . Oxycodone hcl Nausea Only and Other (See Comments)   . Penicillins Nausea Only and Other (See Comments)    Past Medical History:  Diagnosis Date  . Atrial fibrillation (  [place]) Jan. 2015  . Chronic bronchitis   . Congenital heart disease   . Diabetes mellitus without complication ([place])    Type II  . Diverticulosis   . Fall due to stumbling October 08, 2013   Due to shoes  . GERD (gastroesophageal reflux disease)   . Gout   . Hemorrhoids   . Hyperlipidemia   . Hyperparathyroidism   . Hypertension   . Hypothyroidism   . IDA (iron deficiency anemia)   . Microscopic colitis 9/11   Colonoscopy  . Paroxysmal SVT (supraventricular tachycardia) ([place])   . PVD (peripheral vascular disease) ([employer]) 09/2004   Right common femoral endarterectomy in April of 2007  . Renal insufficiency   . Schatzki's ring    Last EGD with esophageal dilatation 35F  9/11  . Scoliosis   . Varicose veins right leg pain and swelling   Past Surgical  History:  Procedure Laterality Date  . ASD REPAIR  Age 72   [facility]  . CARDIOVERSION N/A 11/13/2013   Procedure: CARDIOVERSION;  Surgeon: Herminio Commons, MD;  Location: [facility] ORS;  Service: Endoscopy;  Laterality: N/A;  . COLONOSCOPY  03/04/2010   anal canal hemorrhoids otherwise normal. TI normal. Bx showed lymphocytic colitis. next TCS 02/2020  . COLONOSCOPY  April 2008   Rehman: Pancolonic diverticulosis, external hemorrhoids  . COLONOSCOPY N/A 11/30/2015   RMR: Normal terminal ileum for 10 cm, nonbleeding grade 1 internal hemorrhoids, colonic diverticulosis. Next colonoscopy in 2027.  Marland Kitchen ELAS  06-01-11   Right saphenous ELAS   . ESOPHAGEAL DILATION N/A 05/28/2015   Procedure: ESOPHAGEAL DILATION;  Surgeon: Daneil Dolin, MD;  Location: [facility] ENDO SUITE;  Service: Endoscopy;  Laterality: N/A;  . ESOPHAGOGASTRODUODENOSCOPY  03/04/2010   noncritical appearing Schatzki ring. SB bx negative  . ESOPHAGOGASTRODUODENOSCOPY  05/2008   erosive reflux esophagatitis, noncritical Schatzki ring  . ESOPHAGOGASTRODUODENOSCOPY N/A 05/28/2015   [id number] esophagus/small HH   . [place]  2010  . Left hemithyroidectomy    . Left parathyroidectomy  2007  . Parathyroid adenoma  2007  . Right common femoral endarterectomy  2007  . Small bowel capsule  12 2009   Mid to distal small bowel with edema, erosions, tiny ulceration felt to be NSAID related  . TONSILLECTOMY     Family History  Problem Relation Age of Onset  . Stroke Mother   . Heart disease Mother     CHF  - Amputation-Right Leg  . Hyperlipidemia Mother   . Hypertension Mother   . Lung cancer Father   . Cancer Father     Lung   Social History  Substance Use Topics  . Smoking status: Never Smoker  . Smokeless tobacco: Never Used     Comment: Never smoked  . Alcohol use No    ROS:  General: Negative for anorexia, weight loss, fever, chills, fatigue, weakness. ENT: Negative for hoarseness,  difficulty swallowing , nasal congestion. CV: Negative for chest pain, angina, palpitations, dyspnea on exertion, peripheral edema.  Respiratory: Negative for dyspnea at rest, dyspnea on exertion, cough, sputum, wheezing.  GI: See history of present illness. GU:  Negative for dysuria, hematuria, urinary incontinence, urinary frequency, nocturnal urination.  Endo: Negative for unusual weight change.    Physical Examination:   BP 137/90   Pulse 78   Temp 97.5 F (36.4 C) (Oral)   Ht 4' 10.5" (1.486 m)   Wt 150 lb (68 kg)   BMI 30.82 kg/m  General: Well-nourished, well-developed in no acute distress.  Eyes: No icterus. Mouth: Oropharyngeal mucosa moist and pink , no lesions erythema or exudate. Lungs: Clear to auscultation bilaterally.  Heart: Regular rate and rhythm, no murmurs rubs or gallops.  Abdomen: Bowel sounds are normal, nontender, nondistended, no hepatosplenomegaly or masses, no abdominal bruits or hernia , no rebound or guarding.   RECTAL: Some excoriation anterior hemorrhoid tender to touch. Discomfort noted with digital rectal exam especially with penetration of the anal canal. No masses or stool present. Extremities: No lower extremity edema. No clubbing or deformities. Neuro: Alert and oriented x 4   Skin: Warm and dry, no jaundice.   Psych: Alert and cooperative, normal mood and affect.  Labs:  Lab Results  Component Value Date   CREATININE 1.39 (H) 08/18/2016   BUN 26 (H) 08/18/2016   NA 137 08/18/2016   K 3.7 08/18/2016   CL 102 08/18/2016   CO2 27 08/18/2016   Lab Results  Component Value Date   WBC 7.6 08/18/2016   HGB 13.3 08/18/2016   HCT 41.5 08/18/2016   MCV 97.2 08/18/2016   PLT 281 08/18/2016    Imaging Studies: Mm Screening Breast Tomo Bilateral  Result Date: 08/10/2016 CLINICAL DATA:  Screening. EXAM: 2D DIGITAL SCREENING BILATERAL MAMMOGRAM WITH CAD AND ADJUNCT TOMO COMPARISON:  Previous exam(s). ACR Breast Density Category b: There are  scattered areas of fibroglandular density. FINDINGS: There are no findings suspicious for malignancy. Images were processed with CAD. IMPRESSION: No mammographic evidence of malignancy. A result letter of this screening mammogram will be mailed directly to the patient. RECOMMENDATION: Screening mammogram in one year. (Code:SM-B-01Y) BI-RADS CATEGORY  1: Negative. Electronically Signed   By: Fidela Salisbury M.D.   On: 08/10/2016 17:25

## 2016-08-21 NOTE — Telephone Encounter (Signed)
-----   Message from Lendon Colonel, NP sent at 08/18/2016  4:59 PM EST ----- Labs reviewed. No other changes with the exception of prior result note.

## 2016-08-21 NOTE — Telephone Encounter (Signed)
Called patient with test results. No answer. Left message to call back.  

## 2016-08-22 ENCOUNTER — Telehealth: Payer: Self-pay | Admitting: Gastroenterology

## 2016-08-22 NOTE — Telephone Encounter (Signed)
Tried to call pt- NA-LMOM with information and asked her to call back. Tried to call cell number- NA-no voicemail.

## 2016-08-22 NOTE — Assessment & Plan Note (Addendum)
61 year old female with complaints of frequent rectal bleeding with BMs in the setting of Xarelto and known internal hemorrhoids. Colonoscopy up-to-date within the past year. No upper GI symptoms. Hemoglobin has been stable and normal over the past 6 months. Suspect benign anorectal bleeding from hemorrhoids. Add topical hydrocortisone twice a day. If she were to consider hemorrhoid banding she would have to be off anticoagulation for 6 weeks which may or may not be an option. To discuss further management with Dr. Gala Romney.  Instructed to use MiraLAX regularly to keep stool soft and avoid straining.

## 2016-08-22 NOTE — Telephone Encounter (Signed)
Patient seen in the office yesterday for rectal bleeding felt to be related to hemorrhoids in the setting of Xarelto. Cardiology stopped Xarelto last Friday due to bleeding.   Discussed with Dr. Gala Romney, he recommends hemorrhoid banding now that patient is off anticoagulation but she would have to remain off for at least 2 weeks. I have discussed this with Bunnie Domino, NP with cardiology. She states patient can remain off of Xarelto for 2 weeks, resume as soon as possible.  I have tried to call patient had her cell phone number, there is no voicemail set up. I called and left a message at her home number.  Dr. Gala Romney will like to band her today if possible however next option would be at her to Friday clinic.

## 2016-08-22 NOTE — Progress Notes (Signed)
cc'ed to pcp °

## 2016-08-23 NOTE — Telephone Encounter (Signed)
Pt called back and left a voicemail. I tried to call her back, NA-LMOM. Asked her to call back and schedule asap banding while she is not taking xarelto.  Please schedule if she calls back.

## 2016-08-24 NOTE — Telephone Encounter (Signed)
Per LSL- pt does not need to wait and should not hold her xarelto without talking to cardiology. Rosendo Gros talked to RMR and we can put the pt on for 8am in the morning for a banding, she also spoke to the pt and the pt agreed to come in at 8am and she put her on the schedule. Pt confirmed that she is not taking her xarelto right now.

## 2016-08-24 NOTE — Telephone Encounter (Signed)
Pt is scheduled banding with RMR on March 23rd

## 2016-08-25 ENCOUNTER — Ambulatory Visit (INDEPENDENT_AMBULATORY_CARE_PROVIDER_SITE_OTHER): Payer: PPO | Admitting: Internal Medicine

## 2016-08-25 ENCOUNTER — Encounter: Payer: Self-pay | Admitting: Internal Medicine

## 2016-08-25 VITALS — BP 139/88 | HR 85 | Temp 97.6°F | Ht 58.5 in | Wt 148.2 lb

## 2016-08-25 DIAGNOSIS — K5900 Constipation, unspecified: Secondary | ICD-10-CM | POA: Diagnosis not present

## 2016-08-25 DIAGNOSIS — K648 Other hemorrhoids: Secondary | ICD-10-CM

## 2016-08-25 NOTE — Patient Instructions (Signed)
Avoid straining.  Benefiber 1 tablespoon daily  Limit toilet time to 5 minutes  Call with any interim problems  Use MiraLAX 17 g daily for constipation.  Resume Xarelto March 16th  Schedule followup appointment in 4 weeks from now

## 2016-08-25 NOTE — Progress Notes (Signed)
[place] banding procedure note:  The patient presents with symptomatic grade 1 hemorrhoids, unresponsive to maximal medical therapy, requesting rubber band ligation of her hemorrhoidal disease.  Chronic constipation continues to be a challenge. All risks, benefits, and alternative forms of therapy were described and informed consent was obtained. Xarelto held 2 days.  DRE revealed no abnormalities utilizing nitroglycerin and Xylocaine ointment as lubricant. Anoscopy performed which revealed engorged right anterior right posterior hemorrhoid column.  In the left lateral decubitus positionThe decision was made to band the right anterior internal hemorrhoid;  the [place] was used to perform band ligation without complication. Suction lost somewhat when band port. Digital rectal exam revealed band to be on but modestly so. I elected to place a band on the right posterior hemorrhoid. This was done without difficulty. No pinching or pain. I placed a third band on the left lateral hemorrhoid column. No pinching or pain. Band found to be in excellent position. One back and check the right anterior band once again. This was a very modest placement. I like to go ahead deploy another band in this area. This band gather the appropriate amount of hemorrhoid tissue. The patient had no pinching or pain following deployment of all the above bands.  After 10 minute period of observation, the patient was discharged home without pain or other issues. Dietary and behavioral recommendations were given.  Specific instructions on constipation provided. The importance of avoiding constipation reviewed. Patient to resume Xarelto in 2 weeks.  Office visit with [place] in 6 weeks.  Resume Xarelto on March 16  No complications were encountered and the patient tolerated the procedure well.

## 2016-09-12 DIAGNOSIS — J060 Acute laryngopharyngitis: Secondary | ICD-10-CM | POA: Diagnosis not present

## 2016-09-12 DIAGNOSIS — R05 Cough: Secondary | ICD-10-CM | POA: Diagnosis not present

## 2016-09-12 DIAGNOSIS — Z6830 Body mass index (BMI) 30.0-30.9, adult: Secondary | ICD-10-CM | POA: Diagnosis not present

## 2016-09-15 ENCOUNTER — Ambulatory Visit: Payer: PPO | Admitting: Internal Medicine

## 2016-09-25 DIAGNOSIS — E1122 Type 2 diabetes mellitus with diabetic chronic kidney disease: Secondary | ICD-10-CM | POA: Diagnosis not present

## 2016-09-25 DIAGNOSIS — I10 Essential (primary) hypertension: Secondary | ICD-10-CM | POA: Diagnosis not present

## 2016-09-25 DIAGNOSIS — E039 Hypothyroidism, unspecified: Secondary | ICD-10-CM | POA: Diagnosis not present

## 2016-09-25 DIAGNOSIS — E782 Mixed hyperlipidemia: Secondary | ICD-10-CM | POA: Diagnosis not present

## 2016-09-27 ENCOUNTER — Telehealth: Payer: Self-pay

## 2016-09-27 DIAGNOSIS — G4700 Insomnia, unspecified: Secondary | ICD-10-CM | POA: Diagnosis not present

## 2016-09-27 DIAGNOSIS — D509 Iron deficiency anemia, unspecified: Secondary | ICD-10-CM | POA: Diagnosis not present

## 2016-09-27 DIAGNOSIS — G4733 Obstructive sleep apnea (adult) (pediatric): Secondary | ICD-10-CM | POA: Diagnosis not present

## 2016-09-27 DIAGNOSIS — I482 Chronic atrial fibrillation: Secondary | ICD-10-CM | POA: Diagnosis not present

## 2016-09-27 DIAGNOSIS — N184 Chronic kidney disease, stage 4 (severe): Secondary | ICD-10-CM | POA: Diagnosis not present

## 2016-09-27 DIAGNOSIS — E782 Mixed hyperlipidemia: Secondary | ICD-10-CM | POA: Diagnosis not present

## 2016-09-27 DIAGNOSIS — I10 Essential (primary) hypertension: Secondary | ICD-10-CM | POA: Diagnosis not present

## 2016-09-27 DIAGNOSIS — E1122 Type 2 diabetes mellitus with diabetic chronic kidney disease: Secondary | ICD-10-CM | POA: Diagnosis not present

## 2016-09-27 DIAGNOSIS — Z6830 Body mass index (BMI) 30.0-30.9, adult: Secondary | ICD-10-CM | POA: Diagnosis not present

## 2016-09-27 DIAGNOSIS — E039 Hypothyroidism, unspecified: Secondary | ICD-10-CM | POA: Diagnosis not present

## 2016-09-27 DIAGNOSIS — J45909 Unspecified asthma, uncomplicated: Secondary | ICD-10-CM | POA: Diagnosis not present

## 2016-09-27 NOTE — Telephone Encounter (Signed)
We need to send this to RMR. I cannot tell from his procedure note that he planned to do additional banding. He placed 3 bands last time.   He may want her to hold Xarelto in case he needs to band her again, she would have to hold Xarelto for 48 hours. Let's see what he says.

## 2016-09-27 NOTE — Telephone Encounter (Signed)
Pt called- she wants to know if she needs to hold her xarelto for her banding next week on 4/10?   Routing to LSL in RMR absence.

## 2016-09-27 NOTE — Telephone Encounter (Signed)
Tried to call pt- NA-LMOM to return call.  

## 2016-09-28 ENCOUNTER — Telehealth: Payer: Self-pay | Admitting: Internal Medicine

## 2016-09-28 NOTE — Telephone Encounter (Signed)
Spoke with the pt, she is aware that I have to wait to talk to RMR. She said she was doing ok but still having some bleeding when she has a bm. She is using miralax daily. I told her that I would call her as soon as I spoke with RMR.

## 2016-09-28 NOTE — Telephone Encounter (Signed)
Recall for ultrasound 

## 2016-09-28 NOTE — Telephone Encounter (Signed)
Letter mailed

## 2016-10-01 NOTE — Telephone Encounter (Signed)
OV coming up is just an OV; no banding for now; pt is NOT to stop her anticoagulation

## 2016-10-02 NOTE — Telephone Encounter (Signed)
Tried to call pt- NA- LMOM with recommendations.  

## 2016-10-03 ENCOUNTER — Encounter: Payer: Self-pay | Admitting: Internal Medicine

## 2016-10-03 ENCOUNTER — Ambulatory Visit (INDEPENDENT_AMBULATORY_CARE_PROVIDER_SITE_OTHER): Payer: PPO | Admitting: Internal Medicine

## 2016-10-03 VITALS — BP 136/87 | HR 79 | Temp 96.3°F | Ht 58.5 in | Wt 146.2 lb

## 2016-10-03 DIAGNOSIS — K648 Other hemorrhoids: Secondary | ICD-10-CM

## 2016-10-03 NOTE — Patient Instructions (Signed)
Continue Miralax daily to avoid constipation  Use antacids as needed for constipation  Office visit in 3 months to determine if further banding needed

## 2016-10-03 NOTE — Progress Notes (Signed)
Primary Care Physician:  Wende Neighbors, MD Primary Gastroenterologist:  Dr. Gala Romney  Pre-Procedure History & Physical: HPI:  [name] is a 61 y.o. female here for follow-up of symptomatic hemorrhoids. Patient states bleeding has improved  -  but has not resolved since she underwent a net of 3 bands placed a little over month ago. Back on Xarelto. Really not having any other hemorrhoid symptoms. Constipation is being treated fairly well with MiraLAX daily. Having 1 bowel movement daily every other day. Denies having to strain. No itching, burning or fecal seepage.  GERD symptoms, when they occur, weill controlled with antacids only.  Past Medical History:  Diagnosis Date  . Atrial fibrillation ([place]) Jan. 2015  . Chronic bronchitis   . Congenital heart disease   . Diabetes mellitus without complication ([place])    Type II  . Diverticulosis   . Fall due to stumbling October 08, 2013   Due to shoes  . GERD (gastroesophageal reflux disease)   . Gout   . Hemorrhoids   . Hyperlipidemia   . Hyperparathyroidism   . Hypertension   . Hypothyroidism   . IDA (iron deficiency anemia)   . Microscopic colitis 9/11   Colonoscopy  . Paroxysmal SVT (supraventricular tachycardia) ([place])   . PVD (peripheral vascular disease) ([place]) 09/2004   Right common femoral endarterectomy in April of 2007  . Renal insufficiency   . Schatzki's ring    Last EGD with esophageal dilatation 39F  9/11  . Scoliosis   . Varicose veins right leg pain and swelling    Past Surgical History:  Procedure Laterality Date  . ASD REPAIR  Age 26   [facility]  . CARDIOVERSION N/A 11/13/2013   Procedure: CARDIOVERSION;  Surgeon: Herminio Commons, MD;  Location: [facility] ORS;  Service: Endoscopy;  Laterality: N/A;  . COLONOSCOPY  03/04/2010   anal canal hemorrhoids otherwise normal. TI normal. Bx showed lymphocytic colitis. next TCS 02/2020  . COLONOSCOPY  April 2008   Rehman: Pancolonic  diverticulosis, external hemorrhoids  . COLONOSCOPY N/A 11/30/2015   RMR: Normal terminal ileum for 10 cm, nonbleeding grade 1 internal hemorrhoids, colonic diverticulosis. Next colonoscopy in 2027.  Marland Kitchen ELAS  06-01-11   Right saphenous ELAS   . ESOPHAGEAL DILATION N/A 05/28/2015   Procedure: ESOPHAGEAL DILATION;  Surgeon: Daneil Dolin, MD;  Location: [facility] END[facility];  Service: Endoscopy;  Laterality: N/A;  . ESOPHAGOGASTRODUODENOSCOPY  03/04/2010   noncritical appearing Schatzki ring. SB bx negative  . ESOPHAGOGASTRODUODENOSCOPY  05/2008   erosive reflux esophagatitis, noncritical Schatzki ring  . ESOPHAGOGASTRODUODENOSCOPY N/A 05/28/2015   [id number] esophagus/small HH   . [place]  2010  . Left hemithyroidectomy    . Left parathyroidectomy  2007  . Parathyroid adenoma  2007  . Right common femoral endarterectomy  2007  . Small bowel capsule  12 2009   Mid to distal small bowel with edema, erosions, tiny ulceration felt to be NSAID related  . TONSILLECTOMY      Prior to Admission medications   Medication Sig Start Date End Date Taking? Authorizing Provider  acetaminophen (TYLENOL) 500 MG tablet Take 500 mg by mouth as needed for mild pain or headache.    Yes Historical Provider, MD  allopurinol (ZYLOPRIM) 100 MG tablet Take 100 mg by mouth 2 (two) times daily.  09/14/10  Yes Historical Provider, MD  amiodarone (PACERONE) 200 MG tablet TAKE 1 TABLET BY MOUTH MON-FRI AND 1/2 TABLET ON SAT-SUN.  02/04/16  Yes Evans Lance, MD  calcium carbonate (OS-CAL) 600 MG TABS Take 600 mg by mouth 2 (two) times daily with a meal.     Yes Historical Provider, MD  cholecalciferol (VITAMIN D) 1000 UNITS tablet Take 1,000 Units by mouth daily.   Yes Historical Provider, MD  ferrous sulfate 325 (65 FE) MG tablet Take 325 mg by mouth daily with breakfast.   Yes Historical Provider, MD  FLEXERIL 5 MG tablet Use 5 to 10mg  up to three times daily as needed for spasms 11/01/15  Yes Mahala Menghini, PA-C    furosemide (LASIX) 20 MG tablet TAKE ONE TABLET BY MOUTH DAILY AS NEEDED. 10/06/15  Yes Evans Lance, MD  glipiZIDE (GLUCOTROL) 10 MG tablet Take 10 mg by mouth daily before breakfast.   Yes Historical Provider, MD  levothyroxine (SYNTHROID, LEVOTHROID) 125 MCG tablet Take 125 mcg by mouth daily before breakfast.   Yes Historical Provider, MD  Magnesium 250 MG TABS Take 1 tablet by mouth daily.    Yes Historical Provider, MD  Omega-3 Fatty Acids (FISH OIL PO) Take 360 mg by mouth 2 (two) times daily.   Yes Historical Provider, MD  polyethylene glycol (MIRALAX / GLYCOLAX) packet Take 17 g by mouth daily as needed.   Yes Historical Provider, MD  potassium chloride (K-DUR) 10 MEQ tablet Take 10 mEq by mouth daily.  06/20/13  Yes Historical Provider, MD  Probiotic Product (ALIGN PO) Take 1 tablet by mouth daily.    Yes Historical Provider, MD  rivaroxaban (XARELTO) 10 MG TABS tablet Take 10 mg by mouth daily.   Yes Historical Provider, MD  hydrocortisone (PROCTOZONE-HC) 2.5 % rectal cream Place 1 application rectally 2 (two) times daily. Patient not taking: Reported on 10/03/2016 08/21/16   Mahala Menghini, PA-C    Allergies as of 10/03/2016 - Review Complete 10/03/2016  Allergen Reaction Noted  . Flecainide Nausea Only and Other (See Comments) 11/07/2013  . Hydrocodone-acetaminophen Nausea Only and Other (See Comments)   . Ibuprofen Other (See Comments) 09/05/2011  . Oxycodone hcl Nausea Only and Other (See Comments)   . Penicillins Nausea Only and Other (See Comments)     Family History  Problem Relation Age of Onset  . Stroke Mother   . Heart disease Mother     CHF  - Amputation-Right Leg  . Hyperlipidemia Mother   . Hypertension Mother   . Lung cancer Father   . Cancer Father     Lung    Social History   Social History  . Marital status: Single    Spouse name: N/A  . Number of children: 0  . Years of education: N/A   Occupational History  . office worker    Social History  Main Topics  . Smoking status: Never Smoker  . Smokeless tobacco: Never Used     Comment: Never smoked  . Alcohol use No  . Drug use: No  . Sexual activity: No   Other Topics Concern  . Not on file   Social History Narrative  . No narrative on file    Review of Systems: See HPI, otherwise negative ROS  Physical Exam: BP 136/87   Pulse 79   Temp (!) 96.3 F (35.7 C) (Axillary)   Ht 4' 10.5" (1.486 m)   Wt 146 lb 3.2 oz (66.3 kg)   BMI 30.04 kg/m  General:   Alert,  Well-developed, well-nourished, pleasant and cooperative in NAD   Impression:  Symptomatically,  much improved with the single banding session. I suspect we could anticipate additional improvement since she is only been about 5 weeks since bands placed. She is on chronic anticoagulation therapy. Constipation doing much better. GERD not a major issue at this time  She feels that her hemorrhoids symptoms are going in the right direction at this time.  Recommendations:  Continue Miralax daily to avoid constipation  Use antacids as needed for constipation  Office visit in 3 months to determine if further banding needed    Notice: This dictation was prepared with Dragon dictation along with smaller phrase technology. Any transcriptional errors that result from this process are unintentional and may not be corrected upon review.

## 2016-10-04 ENCOUNTER — Other Ambulatory Visit: Payer: Self-pay

## 2016-10-04 ENCOUNTER — Telehealth: Payer: Self-pay

## 2016-10-04 DIAGNOSIS — K760 Fatty (change of) liver, not elsewhere classified: Secondary | ICD-10-CM

## 2016-10-04 NOTE — Telephone Encounter (Signed)
Pt called to schedule her [place]. She is set up for 10/09/16 @ 10:30 am. She is aware

## 2016-10-09 ENCOUNTER — Ambulatory Visit (HOSPITAL_COMMUNITY): Admission: RE | Admit: 2016-10-09 | Payer: PPO | Source: Ambulatory Visit

## 2016-10-13 ENCOUNTER — Ambulatory Visit (HOSPITAL_COMMUNITY)
Admission: RE | Admit: 2016-10-13 | Discharge: 2016-10-13 | Disposition: A | Discharge status: Home / Self Care | Payer: PPO | Source: Ambulatory Visit | Attending: Gastroenterology | Admitting: Gastroenterology

## 2016-10-13 DIAGNOSIS — K760 Fatty (change of) liver, not elsewhere classified: Secondary | ICD-10-CM | POA: Diagnosis not present

## 2016-10-16 ENCOUNTER — Telehealth: Payer: Self-pay | Admitting: Internal Medicine

## 2016-10-16 NOTE — Telephone Encounter (Signed)
Routing to LSL 

## 2016-10-16 NOTE — Progress Notes (Signed)
u/s again with findings sugestive of chronic renal disease. h/o elevated creatiine. SHE NEEDS TO F/U WITH NEPHROLOGY. PLEASE REFER IF NEEDED. Likely fatty liver with some fibrosis. No overt cirrhosis.   Instructions for fatty liver: Recommend 1-2# weight loss per week until ideal body weight through exercise & diet. Low fat/cholesterol diet.   Avoid sweets, sodas, fruit juices, sweetened beverages like tea, etc. Gradually increase exercise from 15 min daily up to 1 hr per day 5 days/week. Limit alcohol use.  PLEASE CANCEL NIC FOR APPT 10/2016. KEEP NIC FOR RMR APPT 12/2016 FOR HEMORRHOIDS.  NEEDS OV WITH ME IN 03/2017 FOR F/U FATTY LIVER.

## 2016-10-16 NOTE — Telephone Encounter (Signed)
Pt said she had an U/S on Friday and was checking on the results. I told her it could take up to 7-10 business days for the results to be available and someone would contact her.

## 2016-10-16 NOTE — Telephone Encounter (Signed)
SEE RESULT NOTE 

## 2016-10-17 NOTE — Telephone Encounter (Signed)
See result note.  

## 2016-11-04 DIAGNOSIS — R062 Wheezing: Secondary | ICD-10-CM | POA: Diagnosis not present

## 2016-11-04 DIAGNOSIS — R05 Cough: Secondary | ICD-10-CM | POA: Diagnosis not present

## 2016-11-07 DIAGNOSIS — D509 Iron deficiency anemia, unspecified: Secondary | ICD-10-CM | POA: Diagnosis not present

## 2016-11-07 DIAGNOSIS — N183 Chronic kidney disease, stage 3 (moderate): Secondary | ICD-10-CM | POA: Diagnosis not present

## 2016-11-07 DIAGNOSIS — E559 Vitamin D deficiency, unspecified: Secondary | ICD-10-CM | POA: Diagnosis not present

## 2016-11-07 DIAGNOSIS — I10 Essential (primary) hypertension: Secondary | ICD-10-CM | POA: Diagnosis not present

## 2016-11-14 DIAGNOSIS — R0602 Shortness of breath: Secondary | ICD-10-CM | POA: Diagnosis not present

## 2016-11-15 ENCOUNTER — Other Ambulatory Visit: Payer: Self-pay | Admitting: Internal Medicine

## 2016-11-15 DIAGNOSIS — R809 Proteinuria, unspecified: Secondary | ICD-10-CM | POA: Diagnosis not present

## 2016-11-15 DIAGNOSIS — D638 Anemia in other chronic diseases classified elsewhere: Secondary | ICD-10-CM | POA: Diagnosis not present

## 2016-11-15 DIAGNOSIS — N183 Chronic kidney disease, stage 3 (moderate): Secondary | ICD-10-CM | POA: Diagnosis not present

## 2016-11-15 DIAGNOSIS — D509 Iron deficiency anemia, unspecified: Secondary | ICD-10-CM | POA: Diagnosis not present

## 2016-11-15 DIAGNOSIS — N2581 Secondary hyperparathyroidism of renal origin: Secondary | ICD-10-CM | POA: Diagnosis not present

## 2016-11-21 ENCOUNTER — Encounter: Payer: Self-pay | Admitting: Pulmonary Disease

## 2016-11-21 ENCOUNTER — Ambulatory Visit (INDEPENDENT_AMBULATORY_CARE_PROVIDER_SITE_OTHER): Payer: PPO | Admitting: Pulmonary Disease

## 2016-11-21 ENCOUNTER — Ambulatory Visit (INDEPENDENT_AMBULATORY_CARE_PROVIDER_SITE_OTHER)
Admission: RE | Admit: 2016-11-21 | Discharge: 2016-11-21 | Disposition: A | Discharge status: Home / Self Care | Payer: PPO | Source: Ambulatory Visit | Attending: Pulmonary Disease | Admitting: Pulmonary Disease

## 2016-11-21 VITALS — BP 118/78 | HR 89 | Ht 58.5 in | Wt 146.4 lb

## 2016-11-21 DIAGNOSIS — J849 Interstitial pulmonary disease, unspecified: Secondary | ICD-10-CM

## 2016-11-21 DIAGNOSIS — R05 Cough: Secondary | ICD-10-CM | POA: Diagnosis not present

## 2016-11-21 DIAGNOSIS — G4733 Obstructive sleep apnea (adult) (pediatric): Secondary | ICD-10-CM

## 2016-11-21 MED ORDER — ALBUTEROL SULFATE HFA 108 (90 BASE) MCG/ACT IN AERS: 2.0000 | INHALATION_SPRAY | Freq: Four times a day (QID) | RESPIRATORY_TRACT | 3 refills | Status: DC | PRN

## 2016-11-21 MED ORDER — PREDNISONE 10 MG PO TABS: ORAL_TABLET | ORAL | 0 refills | Status: DC

## 2016-11-21 NOTE — Assessment & Plan Note (Signed)
Favor amiodarone toxicity is the cause   Stop taking amiodarone -I will communicate with EP about this Chest x-ray today.  Schedule PFTs  Okay to use Ventolin 2 puffs every 6 hours as needed Prednisone 10 mg tabs  Take 2 tabs daily with food x 5ds, then 1 tab daily with food x 10ds then STOP

## 2016-11-21 NOTE — Progress Notes (Signed)
Subjective:    Patient ID: [name], female    DOB: 05-03-1956, 61 y.o.   MRN: [id number]  HPI    61 year old never smoker presents for evaluation of shortness of breath and wheezing She has seen my partner Dr. Gwenette Greet in 2016 for mild OSA, she was intolerant of CPAP and this was discontinued  She was diagnosed with chronic atrial fibrillation in 2015, underwent DC CV and was placed on amiodarone since then. I have reviewed her last visits with Dr. Lovena Le in 2017-she was maintaining sinus rhythm, on a follow-up visit in 07/2016, amiodarone was decreased to 200 mg Monday to Friday and 100 mg on weekends. It was felt that due to atrial enlargement she was not a great candidate for ablation   For the last 3 weeks she has been explains he increase in dyspnea on exertion. She reports cough productive of white to yellow sputum and intermittent wheezing. She denies childhood history of asthma or seasonal variations/allergies. She denies preceding URI symptoms. She denies environmental exposure to any agents are history of hypersensitivity to smells/chemicals. PCP gave her various inhalers to trial including Ventolin, breo & bevespi She feels that Ventolin has helped her the most.    Past Medical History:  Diagnosis Date  . Atrial fibrillation ([place]) Jan. 2015  . Chronic bronchitis   . Congenital heart disease   . Diabetes mellitus without complication ([place])    Type II  . Diverticulosis   . Fall due to stumbling October 08, 2013   Due to shoes  . GERD (gastroesophageal reflux disease)   . Gout   . Hemorrhoids   . Hyperlipidemia   . Hyperparathyroidism   . Hypertension   . Hypothyroidism   . IDA (iron deficiency anemia)   . Microscopic colitis 9/11   Colonoscopy  . Paroxysmal SVT (supraventricular tachycardia) ([place])   . PVD (peripheral vascular disease) ([place]) 09/2004   Right common femoral endarterectomy in April of 2007  . Renal insufficiency   . Schatzki's ring    Last EGD with  esophageal dilatation 40F  9/11  . Scoliosis   . Varicose veins right leg pain and swelling     Past Surgical History:  Procedure Laterality Date  . ASD REPAIR  Age 33   [facility]  . CARDIOVERSION N/A 11/13/2013   Procedure: CARDIOVERSION;  Surgeon: Herminio Commons, MD;  Location: [facility] ORS;  Service: Endoscopy;  Laterality: N/A;  . COLONOSCOPY  03/04/2010   anal canal hemorrhoids otherwise normal. TI normal. Bx showed lymphocytic colitis. next TCS 02/2020  . COLONOSCOPY  April 2008   [place]: Pancolonic diverticulosis, external hemorrhoids  . COLONOSCOPY N/A 11/30/2015   RMR: Normal terminal ileum for 10 cm, nonbleeding grade 1 internal hemorrhoids, colonic diverticulosis. Next colonoscopy in 2027.  Marland Kitchen ELAS  06-01-11   Right saphenous ELAS   . ESOPHAGEAL DILATION N/A 05/28/2015   Procedure: ESOPHAGEAL DILATION;  Surgeon: Daneil Dolin, MD;  Location: [facility] ENDO SUITE;  Service: Endoscopy;  Laterality: N/A;  . ESOPHAGOGASTRODUODENOSCOPY  03/04/2010   noncritical appearing Schatzki ring. SB bx negative  . ESOPHAGOGASTRODUODENOSCOPY  05/2008   erosive reflux esophagatitis, noncritical Schatzki ring  . ESOPHAGOGASTRODUODENOSCOPY N/A 05/28/2015   SWF:UXNATFTD esophagus/small HH   . [place]  2010  . Left hemithyroidectomy    . Left parathyroidectomy  2007  . Parathyroid adenoma  2007  . Right common femoral endarterectomy  2007  . Small bowel capsule  12 2009  Mid to distal small bowel with edema, erosions, tiny ulceration felt to be NSAID related  . TONSILLECTOMY       Allergies  Allergen Reactions  . Flecainide Nausea Only and Other (See Comments)    Faint feeling  . Hydrocodone-Acetaminophen Nausea Only and Other (See Comments)    Severe headache  . Ibuprofen Other (See Comments)    Kidney dysfunction  . Oxycodone Hcl Nausea Only and Other (See Comments)    Headache  . Penicillins Nausea Only and Other (See Comments)    Severe headache        Social History   Social History  . Marital status: Single    Spouse name: N/A  . Number of children: 0  . Years of education: N/A   Occupational History  . office worker    Social History Main Topics  . Smoking status: Never Smoker  . Smokeless tobacco: Never Used     Comment: Never smoked  . Alcohol use No  . Drug use: No  . Sexual activity: No   Other Topics Concern  . Not on file   Social History Narrative  . No narrative on file      Family History  Problem Relation Age of Onset  . Stroke Mother   . Heart disease Mother        CHF  - Amputation-Right Leg  . Hyperlipidemia Mother   . Hypertension Mother   . Lung cancer Father   . Cancer Father        Lung       Significant tests/ events reviewed  NPSG 02/2014:  AHI 7/hr   Review of Systems  Constitutional: Positive for unexpected weight change. Negative for fever.  HENT: Positive for congestion. Negative for dental problem, ear pain, nosebleeds, postnasal drip, rhinorrhea, sinus pressure, sneezing, sore throat and trouble swallowing.   Eyes: Negative for redness and itching.  Respiratory: Positive for cough, chest tightness and shortness of breath. Negative for wheezing.   Cardiovascular: Negative for palpitations and leg swelling.  Gastrointestinal: Negative for nausea and vomiting.  Genitourinary: Negative for dysuria.  Musculoskeletal: Positive for joint swelling.  Skin: Negative for rash.  Neurological: Negative for headaches.  Hematological: Does not bruise/bleed easily.  Psychiatric/Behavioral: Negative for dysphoric mood. The patient is not nervous/anxious.        Objective:   Physical Exam   Gen. Pleasant,, in no distress, normal affect ENT - no lesions, no post nasal drip, class 2 airway Neck: No JVD, no thyromegaly, no carotid bruits Lungs: no use of accessory muscles, no dullness to percussion, no rales, BL scattered rhonchi  Cardiovascular: Rhythm regular, heart sounds   normal, no murmurs or gallops, no peripheral edema Abdomen: soft and non-tender, no hepatosplenomegaly, BS normal. Musculoskeletal: No deformities, no cyanosis or clubbing Neuro:  alert, non focal, no tremors        Assessment & Plan:

## 2016-11-21 NOTE — Patient Instructions (Signed)
Stop taking amiodarone. Chest x-ray today.  Schedule PFTs  Okay to use Ventolin 2 puffs every 6 hours as needed Prednisone 10 mg tabs  Take 2 tabs daily with food x 5ds, then 1 tab daily with food x 10ds then STOP

## 2016-11-28 ENCOUNTER — Encounter: Payer: Self-pay | Admitting: *Deleted

## 2016-11-30 ENCOUNTER — Encounter: Payer: Self-pay | Admitting: Internal Medicine

## 2016-12-04 ENCOUNTER — Ambulatory Visit (INDEPENDENT_AMBULATORY_CARE_PROVIDER_SITE_OTHER): Payer: PPO | Admitting: Adult Health

## 2016-12-04 ENCOUNTER — Ambulatory Visit (INDEPENDENT_AMBULATORY_CARE_PROVIDER_SITE_OTHER): Payer: PPO | Admitting: Pulmonary Disease

## 2016-12-04 ENCOUNTER — Ambulatory Visit (INDEPENDENT_AMBULATORY_CARE_PROVIDER_SITE_OTHER)
Admission: RE | Admit: 2016-12-04 | Discharge: 2016-12-04 | Disposition: A | Discharge status: Home / Self Care | Payer: PPO | Source: Ambulatory Visit | Attending: Adult Health | Admitting: Adult Health

## 2016-12-04 ENCOUNTER — Other Ambulatory Visit (INDEPENDENT_AMBULATORY_CARE_PROVIDER_SITE_OTHER): Payer: PPO

## 2016-12-04 ENCOUNTER — Encounter: Payer: Self-pay | Admitting: Adult Health

## 2016-12-04 VITALS — BP 118/78 | HR 78 | Ht <= 58 in | Wt 147.4 lb

## 2016-12-04 DIAGNOSIS — J849 Interstitial pulmonary disease, unspecified: Secondary | ICD-10-CM

## 2016-12-04 DIAGNOSIS — I480 Paroxysmal atrial fibrillation: Secondary | ICD-10-CM | POA: Diagnosis not present

## 2016-12-04 DIAGNOSIS — J189 Pneumonia, unspecified organism: Secondary | ICD-10-CM

## 2016-12-04 DIAGNOSIS — I509 Heart failure, unspecified: Secondary | ICD-10-CM

## 2016-12-04 LAB — PULMONARY FUNCTION TEST
DL/VA % PRED: 87 %
DL/VA: 3.42 ml/min/mmHg/L
DLCO COR: 10.04 ml/min/mmHg
DLCO UNC % PRED: 61 %
DLCO UNC: 9.88 ml/min/mmHg
DLCO cor % pred: 62 %
FEF 25-75 PRE: 1.92 L/s
FEF 25-75 Post: 1.07 L/sec
FEF2575-%CHANGE-POST: -44 %
FEF2575-%PRED-PRE: 96 %
FEF2575-%Pred-Post: 53 %
FEV1-%Change-Post: -9 %
FEV1-%PRED-POST: 66 %
FEV1-%PRED-PRE: 73 %
FEV1-POST: 1.34 L
FEV1-Pre: 1.47 L
FEV1FVC-%Change-Post: -6 %
FEV1FVC-%PRED-PRE: 110 %
FEV6-%CHANGE-POST: -1 %
FEV6-%PRED-POST: 67 %
FEV6-%Pred-Pre: 67 %
FEV6-PRE: 1.69 L
FEV6-Post: 1.67 L
FEV6FVC-%PRED-POST: 104 %
FEV6FVC-%PRED-PRE: 104 %
FVC-%Change-Post: -2 %
FVC-%Pred-Post: 64 %
FVC-%Pred-Pre: 66 %
FVC-Post: 1.67 L
FVC-Pre: 1.72 L
POST FEV1/FVC RATIO: 80 %
Post FEV6/FVC ratio: 100 %
Pre FEV1/FVC ratio: 86 %
Pre FEV6/FVC Ratio: 100 %
RV % pred: 103 %
RV: 1.76 L
TLC % pred: 91 %
TLC: 3.78 L

## 2016-12-04 LAB — CBC WITH DIFFERENTIAL/PLATELET
BASOS ABS: 0.1 10*3/uL (ref 0.0–0.1)
BASOS PCT: 0.9 % (ref 0.0–3.0)
EOS ABS: 0.2 10*3/uL (ref 0.0–0.7)
Eosinophils Relative: 1.1 % (ref 0.0–5.0)
HCT: 35.3 % — ABNORMAL LOW (ref 36.0–46.0)
Hemoglobin: 11.1 g/dL — ABNORMAL LOW (ref 12.0–15.0)
LYMPHS ABS: 2 10*3/uL (ref 0.7–4.0)
Lymphocytes Relative: 14.5 % (ref 12.0–46.0)
MCHC: 31.5 g/dL (ref 30.0–36.0)
MCV: 90.3 fl (ref 78.0–100.0)
Monocytes Absolute: 0.9 10*3/uL (ref 0.1–1.0)
Monocytes Relative: 6.5 % (ref 3.0–12.0)
NEUTROS ABS: 10.7 10*3/uL — AB (ref 1.4–7.7)
NEUTROS PCT: 77 % (ref 43.0–77.0)
PLATELETS: 316 10*3/uL (ref 150.0–400.0)
RBC: 3.91 Mil/uL (ref 3.87–5.11)
RDW: 17.3 % — AB (ref 11.5–15.5)
WBC: 13.9 10*3/uL — ABNORMAL HIGH (ref 4.0–10.5)

## 2016-12-04 LAB — BASIC METABOLIC PANEL
BUN: 32 mg/dL — ABNORMAL HIGH (ref 6–23)
CO2: 27 meq/L (ref 19–32)
CREATININE: 1.42 mg/dL — AB (ref 0.40–1.20)
Calcium: 9.9 mg/dL (ref 8.4–10.5)
Chloride: 101 mEq/L (ref 96–112)
GFR: 40.02 mL/min — AB (ref >60.00)
GLUCOSE: 250 mg/dL — AB (ref 70–99)
Potassium: 4.4 mEq/L (ref 3.5–5.1)
SODIUM: 136 meq/L (ref 135–145)

## 2016-12-04 LAB — BRAIN NATRIURETIC PEPTIDE: PRO B NATRI PEPTIDE: 376 pg/mL — AB (ref 0.0–100.0)

## 2016-12-04 LAB — SEDIMENTATION RATE: Sed Rate: 37 mm/hr — ABNORMAL HIGH (ref 0–30)

## 2016-12-04 NOTE — Assessment & Plan Note (Signed)
?  recent decompensation , improved with diuresis  Check BNP /bmet today .

## 2016-12-04 NOTE — Assessment & Plan Note (Signed)
Follow up with cardiology  Remain off Amidoarone for now .

## 2016-12-04 NOTE — Assessment & Plan Note (Signed)
Interstitial changes on CXR ? From edema vs Amiodarone vs PNA  . She does have Moderate Restriction and decreased DLCO on PFT but CXR is improved after ABX/Steroids and diuresis .  So hard to determine if this was a combination of problems with PNA +/- amidarone toxicity  For now remain off amiodarone . Follow up with cardiology to discuss A Fib options.  Will check labs with BNP and ESR today  cxr is improved and will repeat in 4-6 weeks   Plan  Patient Instructions  Refer to cardiology, Dr. Lovena Le for follow up for  A FIb.  Finish prednisone as directed.  Labs today .  Follow up Dr. Elsworth Soho  In 4-6 weeks with chest xray .  Please contact office for sooner follow up if symptoms do not improve or worsen or seek emergency care  ]

## 2016-12-04 NOTE — Progress Notes (Signed)
_0  ID: [name], female    DOB: 11/28/1955, 61 y.o.   MRN: [id number]  Chief Complaint  Patient presents with  . Follow-up    ILD     Referring provider: Celene Squibb, MD  HPI: 61 year old female never smoker seen 11/21/2016 for cough and shortness of breath Chronic A Fib 2015 s/p DCCV placed on amiodarone  Mild OSA w/ AHI 7/hr , CPAP intolerant     12/04/2016 Follow up : Cough/Dyspnea ? Amiodarone Toxicity  Patient returns for a two-week follow-up. Patient was seen last visit for a patient consult for cough and shortness of breath for 3-4 weeks. She had been tried on inhalers including ventolin, BREO  And Bevespi without help. It was felt patient may have a component of amiodarone toxicity. She was started on a prednisone taper. Is set up for pulmonary function test. Chest x-ray did show right sided density and small right pleural effusion. Along w/ coarse interstitial markings. Her amiodarone was stopped.  She was felt to have a possible pneumonia plus or minus edema. She was started on Lasix and Levaquin. Pulmonary function test today shows moderate restriction with an FEV1 at 73%, ratio 86, FVC 66%, DLCO 61%. Total lung capacity 91%. No significant bronchodilator response. Pt does have significant scoliosis .  Pt is feeling better w/ less cough and dyspnea.  Feels she does not have to stop as much to rest with her breathing.     Allergies  Allergen Reactions  . Flecainide Nausea Only and Other (See Comments)    Faint feeling  . Hydrocodone-Acetaminophen Nausea Only and Other (See Comments)    Severe headache  . Ibuprofen Other (See Comments)    Kidney dysfunction  . Oxycodone Hcl Nausea Only and Other (See Comments)    Headache  . Penicillins Nausea Only and Other (See Comments)    Severe headache    Immunization History  Administered Date(s) Administered  . Influenza-Unspecified 03/07/2014    Past Medical History:  Diagnosis Date  . Atrial  fibrillation ([place]) Jan. 2015  . Chronic bronchitis   . Congenital heart disease   . Diabetes mellitus without complication ([place])    Type II  . Diverticulosis   . Fall due to stumbling October 08, 2013   Due to shoes  . GERD (gastroesophageal reflux disease)   . Gout   . Hemorrhoids   . Hyperlipidemia   . Hyperparathyroidism   . Hypertension   . Hypothyroidism   . IDA (iron deficiency anemia)   . Microscopic colitis 9/11   Colonoscopy  . Paroxysmal SVT (supraventricular tachycardia) ([place])   . PVD (peripheral vascular disease) ([place]) 09/2004   Right common femoral endarterectomy in April of 2007  . Renal insufficiency   . Schatzki's ring    Last EGD with esophageal dilatation 26F  9/11  . Scoliosis   . Varicose veins right leg pain and swelling    Tobacco History: History  Smoking Status  . Never Smoker  Smokeless Tobacco  . Never Used    Comment: Never smoked   Counseling given: Not Answered   Outpatient Encounter Prescriptions as of 12/04/2016  Medication Sig  . acetaminophen (TYLENOL) 500 MG tablet Take 500 mg by mouth every 6 (six) hours as needed.  Marland Kitchen albuterol (PROVENTIL HFA;VENTOLIN HFA) 108 (90 Base) MCG/ACT inhaler Inhale 2 puffs into the lungs every 6 (six) hours as needed for wheezing or shortness of breath.  . allopurinol (ZYLOPRIM) 100 MG tablet Take 100 mg by  mouth 2 (two) times daily.   . calcium carbonate (OS-CAL) 600 MG TABS Take 600 mg by mouth 2 (two) times daily with a meal.    . cholecalciferol (VITAMIN D) 1000 UNITS tablet Take 1,000 Units by mouth daily.  . ferrous sulfate 325 (65 FE) MG tablet Take 325 mg by mouth daily with breakfast.  . furosemide (LASIX) 20 MG tablet TAKE ONE TABLET BY MOUTH DAILY AS NEEDED.  Marland Kitchen glipiZIDE (GLUCOTROL) 5 MG tablet Take 5 mg by mouth daily before breakfast.   . levothyroxine (SYNTHROID, LEVOTHROID) 125 MCG tablet Take 125 mcg by mouth daily before breakfast.  . Loperamide HCl (IMODIUM A-D PO) Take by mouth as needed.    . Magnesium 250 MG TABS Take 1 tablet by mouth daily.   . Omega-3 Fatty Acids (FISH OIL PO) Take 360 mg by mouth 2 (two) times daily.  . polyethylene glycol (MIRALAX / GLYCOLAX) packet Take 17 g by mouth daily as needed.  . potassium chloride (K-DUR) 10 MEQ tablet Take 10 mEq by mouth daily.   . Probiotic Product (ALIGN PO) Take 1 tablet by mouth daily.   . Rivaroxaban (XARELTO) 15 MG TABS tablet Take 15 mg by mouth daily.   . temazepam (RESTORIL) 7.5 MG capsule Take 7.5 mg by mouth at bedtime as needed for sleep.  Marland Kitchen zolpidem (AMBIEN CR) 12.5 MG CR tablet Take 12.5 mg by mouth at bedtime as needed for sleep.  . [DISCONTINUED] amiodarone (PACERONE) 200 MG tablet TAKE 1 TABLET BY MOUTH MON-FRI AND 1/2 TABLET ON SAT-SUN.   No facility-administered encounter medications on file as of 12/04/2016.      Review of Systems  Constitutional:   No  weight loss, night sweats,  Fevers, chills, fatigue, or  lassitude.  HEENT:   No headaches,  Difficulty swallowing,  Tooth/dental problems, or  Sore throat,                No sneezing, itching, ear ache, nasal congestion, post nasal drip,   CV:  No chest pain,  Orthopnea, PND, swelling in lower extremities, anasarca, dizziness, palpitations, syncope.   GI  No heartburn, indigestion, abdominal pain, nausea, vomiting, diarrhea, change in bowel habits, loss of appetite, bloody stools.   Resp:    No chest wall deformity  Skin: no rash or lesions.  GU: no dysuria, change in color of urine, no urgency or frequency.  No flank pain, no hematuria   MS:  No joint pain or swelling.  No decreased range of motion.  No back pain.    Physical Exam  BP 118/78 (BP Location: Left Arm, Patient Position: Sitting, Cuff Size: Normal)   Pulse 78   Ht _0  (1.473 m)   Wt 147 lb 6.4 oz (66.9 kg)   SpO2 99%   BMI 30.81 kg/m   GEN: A/Ox3; pleasant , NAD, petite    HEENT:  [place]/AT,  EACs-clear, TMs-wnl, NOSE-clear, THROAT-clear, no lesions, no postnasal drip or  exudate noted. Hearing aids   NECK:  Supple w/ fair ROM; no JVD; normal carotid impulses w/o bruits; no thyromegaly or nodules palpated; no lymphadenopathy.    RESP  Clear  P & A; w/o, wheezes/ rales/ or rhonchi. no accessory muscle use, no dullness to percussion  CARD:  RRR, no m/r/g, tr  peripheral edema, pulses intact, no cyanosis or clubbing.  GI:   Soft & nt; nml bowel sounds; no organomegaly or masses detected.   Musco: Warm bil, no deformities or joint swelling noted.  Neuro: alert, no focal deficits noted.    Skin: Warm, no lesions or rashes   Lab Results:  CBC  BNP No results found for: BNP  ProBNP Imaging: Dg Chest 2 View  Result Date: 12/04/2016 CLINICAL DATA:  Patient with history of pneumonia. Follow-up evaluation. EXAM: CHEST  2 VIEW COMPARISON:  Chest radiograph 11/21/2016. FINDINGS: Stable cardiomegaly. Low lung volumes. Interval improvement in previously described consolidation within the right lower hemithorax. No pleural effusion or pneumothorax. Mid thoracic spine degenerative changes. IMPRESSION: Interval improvement yet not completely resolved consolidative opacities within the right lower lung, suggestive of improving infectious process. Recommend additional follow-up chest radiograph in 3- 4 weeks to ensure complete resolution. Electronically Signed   By: Lovey Newcomer M.D.   On: 12/04/2016 10:21   Dg Chest 2 View  Result Date: 11/21/2016 CLINICAL DATA:  Cough and chest congestion for the past 3 weeks. History of chronic bronchitis, atrial fibrillation, diabetes. EXAM: CHEST  2 VIEW COMPARISON:  Chest x-ray of January 03, 2016 FINDINGS: The left lung is well-expanded. On the right there is patchy density in the infrahilar region. There is a small right pleural effusion which has increased since the previous study. The cardiac silhouette is mildly enlarged. The interstitial markings are coarse and more conspicuous today. The central pulmonary vascularity is  prominent. There is stable reverse S shaped thoracolumbar scoliosis. There surgical clips in the upper paratracheal region likely from previous thyroid surgery. IMPRESSION: Increased right perihilar density worrisome for pneumonia. Followup PA and lateral chest X-ray is recommended in 3-4 weeks following trial of antibiotic therapy to ensure resolution and exclude underlying malignancy. Cardiomegaly with central pulmonary vascular congestion and mild pulmonary interstitial edema. Small right pleural effusion larger than that seen previously. Electronically Signed   By: David  Martinique M.D.   On: 11/21/2016 11:48     Assessment & Plan:   ILD (interstitial lung disease) ([place]) Interstitial changes on CXR ? From edema vs Amiodarone vs PNA  . She does have Moderate Restriction and decreased DLCO on PFT but CXR is improved after ABX/Steroids and diuresis .  So hard to determine if this was a combination of problems with PNA +/- amidarone toxicity  For now remain off amiodarone . Follow up with cardiology to discuss A Fib options.  Will check labs with BNP and ESR today  cxr is improved and will repeat in 4-6 weeks   Plan  Patient Instructions  Refer to cardiology, Dr. Lovena Le for follow up for  A FIb.  Finish prednisone as directed.  Labs today .  Follow up Dr. Elsworth Soho  In 4-6 weeks with chest xray .  Please contact office for sooner follow up if symptoms do not improve or worsen or seek emergency care  ]    CHF (congestive heart failure) ?recent decompensation , improved with diuresis  Check BNP /bmet today .   A-fib Follow up with cardiology  Remain off Amidoarone for now .      Rexene Edison, NP 12/04/2016

## 2016-12-04 NOTE — Progress Notes (Signed)
PFT completed today 12/04/16

## 2016-12-04 NOTE — Patient Instructions (Addendum)
Refer to cardiology, Dr. Lovena Le for follow up for  A FIb.  Finish prednisone as directed.  Labs today .  Follow up Dr. Elsworth Soho  In 4-6 weeks with chest xray .  Please contact office for sooner follow up if symptoms do not improve or worsen or seek emergency care  ]

## 2016-12-05 ENCOUNTER — Encounter: Payer: Self-pay | Admitting: Adult Health

## 2016-12-05 ENCOUNTER — Ambulatory Visit (INDEPENDENT_AMBULATORY_CARE_PROVIDER_SITE_OTHER): Payer: PPO | Admitting: Adult Health

## 2016-12-05 ENCOUNTER — Other Ambulatory Visit (HOSPITAL_COMMUNITY)
Admission: RE | Admit: 2016-12-05 | Discharge: 2016-12-05 | Disposition: A | Discharge status: Home / Self Care | Payer: PPO | Source: Ambulatory Visit | Attending: Adult Health | Admitting: Adult Health

## 2016-12-05 VITALS — BP 144/90 | HR 84 | Ht <= 58 in | Wt 147.5 lb

## 2016-12-05 DIAGNOSIS — Z124 Encounter for screening for malignant neoplasm of cervix: Secondary | ICD-10-CM | POA: Insufficient documentation

## 2016-12-05 DIAGNOSIS — R1032 Left lower quadrant pain: Secondary | ICD-10-CM | POA: Diagnosis not present

## 2016-12-05 DIAGNOSIS — N950 Postmenopausal bleeding: Secondary | ICD-10-CM

## 2016-12-05 NOTE — Progress Notes (Signed)
She needs appt ASAP with cardiology - taylor or other provider

## 2016-12-05 NOTE — Progress Notes (Signed)
Subjective:     Patient ID: [name], female   DOB: May 27, 1956, 61 y.o.   MRN: [id number]  HPI [name] is a 61 year old white female, single,G0P0, in for vaginal bleeding for 2 months and LLQ pain.She has CKD and sees DR Lowanda Foster.She has not had pap in over 5 years, she said Courtney at Dr Juel Burrow office tried but it hurt too bad and she stopped.  PCP is [facility].  Review of Systems Vaginal bleeding for 2 months and LLQ pain Reviewed past medical,surgical, social and family history. Reviewed medications and allergies.     Objective:   Physical Exam BP (!) 144/90 (BP Location: Left Arm, Patient Position: Sitting, Cuff Size: Normal)   Pulse 84   Ht 4\' 10"  (1.473 m)   Wt 147 lb 8 oz (66.9 kg)   BMI 30.83 kg/m    PHQ 2 score 0. Skin warm and dry.Pelvic: external genitalia is normal in appearance no lesions, vagina: pale and dry, with some tan discharge, urethra has no lesions or masses noted, cervix:not visualized, blind sweep pap with HPV performed, used pediatric speculum for exam, uterus: normal size, shape and contour, non tender, no masses felt, adnexa: no masses, LLQ tenderness noted. Bladder is non tender and no masses felt.  Will get [place] to assess uterus and ovaries.  Assessment:     1. Postmenopausal bleeding   2. LLQ pain       Plan:     Pap with HPV sent Return in 1 week for GYN [place] to assess bleeding

## 2016-12-06 ENCOUNTER — Telehealth: Payer: Self-pay | Admitting: Adult Health

## 2016-12-06 NOTE — Telephone Encounter (Signed)
Patient states she was sent a text with her results from yesterday but wasn't able to see the message. Please call the patient back at her home phone number and leave a message if she doesn't answer.  Thank you

## 2016-12-06 NOTE — Progress Notes (Signed)
Spoke with patient and made her aware of results. Pt stated she does not currently have card appointment, but will call Dr. Lovena Le today to schedule one. She verbalized understanding and did not have any questions. Nothing further is needed.

## 2016-12-06 NOTE — Progress Notes (Signed)
Left message for patient to contact office.

## 2016-12-08 LAB — CYTOLOGY - PAP
DIAGNOSIS: NEGATIVE
HPV (WINDOPATH): NOT DETECTED

## 2016-12-12 DIAGNOSIS — R1030 Lower abdominal pain, unspecified: Secondary | ICD-10-CM | POA: Diagnosis not present

## 2016-12-12 DIAGNOSIS — R109 Unspecified abdominal pain: Secondary | ICD-10-CM | POA: Diagnosis not present

## 2016-12-13 ENCOUNTER — Ambulatory Visit (INDEPENDENT_AMBULATORY_CARE_PROVIDER_SITE_OTHER): Payer: PPO

## 2016-12-13 DIAGNOSIS — N950 Postmenopausal bleeding: Secondary | ICD-10-CM

## 2016-12-13 DIAGNOSIS — R1032 Left lower quadrant pain: Secondary | ICD-10-CM

## 2016-12-13 NOTE — Progress Notes (Signed)
T/A PELVIC ULTRASOUND:homogeneous anteverted uterus,wnl,limited view of endometrium,EEC 3.7 mm,normal ovaries bialt,transvaginal ultrasound was not done per.Anderson [place]. Her pap smear was very difficult and didn't think we would be able to do the TV.

## 2016-12-14 ENCOUNTER — Telehealth: Payer: Self-pay | Admitting: Adult Health

## 2016-12-14 ENCOUNTER — Encounter: Payer: Self-pay | Admitting: Internal Medicine

## 2016-12-14 ENCOUNTER — Ambulatory Visit (INDEPENDENT_AMBULATORY_CARE_PROVIDER_SITE_OTHER): Payer: PPO | Admitting: Internal Medicine

## 2016-12-14 VITALS — BP 130/80 | HR 70 | Ht <= 58 in | Wt 150.0 lb

## 2016-12-14 DIAGNOSIS — I480 Paroxysmal atrial fibrillation: Secondary | ICD-10-CM

## 2016-12-14 DIAGNOSIS — R0600 Dyspnea, unspecified: Secondary | ICD-10-CM

## 2016-12-14 NOTE — Progress Notes (Signed)
HPI [name] returns today for followup. She is a pleasant 61 yo woman with a h/o atrial fib with an RVR who underwent DCCV and initiation of fairly high dose amiodarone therapy after failing AA Rx with rhythmol. She has developed sob and is felt to have amio lung toxicity and has had her amio stopped. She notes occaisional palpitations. No chest pain. She has not had a 2D echo in several years. The patient has a non-productive cough. No syncope. Minimal peripheral edema. Allergies  Allergen Reactions  . Flecainide Nausea Only and Other (See Comments)    Faint feeling  . Hydrocodone-Acetaminophen Nausea Only and Other (See Comments)    Severe headache  . Ibuprofen Other (See Comments)    Kidney dysfunction  . Oxycodone Hcl Nausea Only and Other (See Comments)    Headache  . Penicillins Nausea Only and Other (See Comments)    Severe headache     Current Outpatient Prescriptions  Medication Sig Dispense Refill  . acetaminophen (TYLENOL) 500 MG tablet Take 500 mg by mouth every 6 (six) hours as needed.    Marland Kitchen albuterol (PROVENTIL HFA;VENTOLIN HFA) 108 (90 Base) MCG/ACT inhaler Inhale 2 puffs into the lungs every 6 (six) hours as needed for wheezing or shortness of breath. 1 Inhaler 3  . allopurinol (ZYLOPRIM) 100 MG tablet Take 100 mg by mouth 2 (two) times daily.     . calcium carbonate (OS-CAL) 600 MG TABS Take 600 mg by mouth 2 (two) times daily with a meal.      . cholecalciferol (VITAMIN D) 1000 UNITS tablet Take 1,000 Units by mouth daily.    . ferrous sulfate 325 (65 FE) MG tablet Take 325 mg by mouth daily with breakfast.    . furosemide (LASIX) 20 MG tablet TAKE ONE TABLET BY MOUTH DAILY AS NEEDED. 30 tablet 3  . glipiZIDE (GLUCOTROL) 5 MG tablet Take 5 mg by mouth daily before breakfast.     . levothyroxine (SYNTHROID, LEVOTHROID) 125 MCG tablet Take 125 mcg by mouth daily before breakfast.    . Loperamide HCl (IMODIUM A-D PO) Take by mouth as needed.    . Magnesium  250 MG TABS Take 1 tablet by mouth daily.     . Omega-3 Fatty Acids (FISH OIL PO) Take 360 mg by mouth 2 (two) times daily.    . polyethylene glycol (MIRALAX / GLYCOLAX) packet Take 17 g by mouth daily as needed.    . potassium chloride (K-DUR) 10 MEQ tablet Take 10 mEq by mouth daily.     . pravastatin (PRAVACHOL) 10 MG tablet Take 10 mg by mouth daily.    . Probiotic Product (ALIGN PO) Take 1 tablet by mouth daily.     . Rivaroxaban (XARELTO) 15 MG TABS tablet Take 15 mg by mouth daily.     . temazepam (RESTORIL) 7.5 MG capsule Take 7.5 mg by mouth at bedtime as needed for sleep.     No current facility-administered medications for this visit.      Past Medical History:  Diagnosis Date  . Atrial fibrillation ([place]) Jan. 2015  . Chronic bronchitis   . Congenital heart disease   . Diabetes mellitus without complication ([place])    Type II  . Diverticulosis   . Fall due to stumbling October 08, 2013   Due to shoes  . GERD (gastroesophageal reflux disease)   . Gout   . Hemorrhoids   . Hyperlipidemia   . Hyperparathyroidism   .  Hypertension   . Hypothyroidism   . IDA (iron deficiency anemia)   . Microscopic colitis 9/11   Colonoscopy  . Paroxysmal SVT (supraventricular tachycardia) ([place])   . PVD (peripheral vascular disease) ([place]) 09/2004   Right common femoral endarterectomy in April of 2007  . Renal insufficiency   . Schatzki's ring    Last EGD with esophageal dilatation [name]  9/11  . Scoliosis   . Varicose veins right leg pain and swelling    ROS:   All systems reviewed and negative except as noted in the HPI.   Past Surgical History:  Procedure Laterality Date  . ASD REPAIR  Age 21   [facility]  . CARDIOVERSION N/A 11/13/2013   Procedure: CARDIOVERSION;  Surgeon: Herminio Commons, MD;  Location: [facility];  Service: Endoscopy;  Laterality: N/A;  . COLONOSCOPY  03/04/2010   anal canal hemorrhoids otherwise normal. TI normal. Bx showed  lymphocytic colitis. next TCS 02/2020  . COLONOSCOPY  April 2008   Rehman: Pancolonic diverticulosis, external hemorrhoids  . COLONOSCOPY N/A 11/30/2015   RMR: Normal terminal ileum for 10 cm, nonbleeding grade 1 internal hemorrhoids, colonic diverticulosis. Next colonoscopy in 2027.  Marland Kitchen ELAS  06-01-11   Right saphenous ELAS   . ESOPHAGEAL DILATION N/A 05/28/2015   Procedure: ESOPHAGEAL DILATION;  Surgeon: Daneil Dolin, MD;  Location: [facility];  Service: Endoscopy;  Laterality: N/A;  . ESOPHAGOGASTRODUODENOSCOPY  03/04/2010   noncritical appearing Schatzki ring. SB bx negative  . ESOPHAGOGASTRODUODENOSCOPY  05/2008   erosive reflux esophagatitis, noncritical Schatzki ring  . ESOPHAGOGASTRODUODENOSCOPY N/A 05/28/2015   [id number] esophagus/small HH   . [place]  2010  . Left hemithyroidectomy    . Left parathyroidectomy  2007  . Parathyroid adenoma  2007  . Right common femoral endarterectomy  2007  . Small bowel capsule  12 2009   Mid to distal small bowel with edema, erosions, tiny ulceration felt to be NSAID related  . TONSILLECTOMY       Family History  Problem Relation Age of Onset  . Stroke Mother   . Heart disease Mother        CHF  - Amputation-Right Leg  . Hyperlipidemia Mother   . Hypertension Mother   . Congestive Heart Failure Mother   . Lung cancer Father   . Cancer Father        Lung  . Other Brother        back problems  . Heart attack Paternal Grandmother   . Cancer Maternal Grandmother        uterine     Social History   Social History  . Marital status: Single    Spouse name: N/A  . Number of children: 0  . Years of education: N/A   Occupational History  . office worker    Social History Main Topics  . Smoking status: Never Smoker  . Smokeless tobacco: Never Used     Comment: Never smoked  . Alcohol use No  . Drug use: No  . Sexual activity: No   Other Topics Concern  . Not on file   Social History Narrative  . No  narrative on file     BP 130/80   Pulse 70   Ht 4\' 10"  (1.473 m)   Wt 150 lb (68 kg)   SpO2 91%   BMI 31.35 kg/m   Physical Exam:  Well appearing 61 yo woman, NAD HEENT: Unremarkable Neck:  6  cm JVD, no thyromegally Back:  No CVA tenderness Lungs:  Scattered rales bilaterally.  HEART:  Regular rate and rhythm, no murmurs, no rubs, no clicks Abd:  soft, positive bowel sounds, no organomegally, no rebound, no guarding Ext:  2 plus pulses, no edema, no cyanosis, no clubbing Skin:  No rashes no nodules Neuro:  CN II through XII intact, motor grossly intact  Assess/Plan: 1. Atrial fib - she is maintaining NSR but has had to stop her amio which took place about 3 weeks ago. She is certain to go back into atrial fib. When she does we will consider dofetilide vs AV node ablation and PPM. She ha refused atrial fib a  2. HTN - her blood pressure is controlled. She will continue her current meds. 3. Anemia - workup ongoing. If she needs she can stop the xarelto. 4. Chronic diastolic heart failure - she is much better when she is in rhythm. She is encouraged to maintain a low sodium diet. Because her dyspnea has worsened, I will ask her to check a 2D echo to be sure there is no evidence of another cause of her dyspnea.  Mikle Bosworth.D.

## 2016-12-14 NOTE — Patient Instructions (Addendum)
Medication Instructions:    Your physician recommends that you continue on your current medications as directed. Please refer to the Current Medication list given to you today.  - If you need a refill on your cardiac medications before your next appointment, please call your pharmacy.   Labwork:  None ordered  Testing/Procedures: Your physician has requested that you have an echocardiogram. Echocardiography is a painless test that uses sound waves to create images of your heart. It provides your doctor with information about the size and shape of your heart and how well your heart's chambers and valves are working. This procedure takes approximately one hour. There are no restrictions for this procedure.  Follow-Up:  Your physician recommends that you schedule a follow-up appointment in: 3 months with Dr. Lovena Le.  Thank you for choosing [facility]!!

## 2016-12-14 NOTE — Telephone Encounter (Signed)
Pt aware US normal  

## 2016-12-18 ENCOUNTER — Ambulatory Visit (HOSPITAL_COMMUNITY)
Admission: RE | Admit: 2016-12-18 | Discharge: 2016-12-18 | Disposition: A | Discharge status: Home / Self Care | Payer: PPO | Source: Ambulatory Visit | Attending: Internal Medicine | Admitting: Internal Medicine

## 2016-12-18 DIAGNOSIS — R0600 Dyspnea, unspecified: Secondary | ICD-10-CM | POA: Diagnosis not present

## 2016-12-18 DIAGNOSIS — I080 Rheumatic disorders of both mitral and aortic valves: Secondary | ICD-10-CM | POA: Diagnosis not present

## 2016-12-18 DIAGNOSIS — I420 Dilated cardiomyopathy: Secondary | ICD-10-CM | POA: Insufficient documentation

## 2016-12-18 DIAGNOSIS — I480 Paroxysmal atrial fibrillation: Secondary | ICD-10-CM | POA: Diagnosis not present

## 2016-12-18 DIAGNOSIS — I5030 Unspecified diastolic (congestive) heart failure: Secondary | ICD-10-CM | POA: Insufficient documentation

## 2016-12-18 NOTE — Progress Notes (Signed)
*  PRELIMINARY RESULTS* Echocardiogram 2D Echocardiogram has been performed.  Leavy Cella 12/18/2016, 1:23 PM

## 2016-12-21 ENCOUNTER — Other Ambulatory Visit: Payer: Self-pay | Admitting: Internal Medicine

## 2016-12-23 ENCOUNTER — Other Ambulatory Visit (HOSPITAL_COMMUNITY): Payer: Self-pay | Admitting: Physician Assistant

## 2016-12-23 ENCOUNTER — Ambulatory Visit (HOSPITAL_COMMUNITY)
Admission: RE | Admit: 2016-12-23 | Discharge: 2016-12-23 | Disposition: A | Discharge status: Home / Self Care | Payer: PPO | Source: Ambulatory Visit | Attending: Physician Assistant | Admitting: Physician Assistant

## 2016-12-23 DIAGNOSIS — M25561 Pain in right knee: Secondary | ICD-10-CM | POA: Diagnosis not present

## 2016-12-23 DIAGNOSIS — W19XXXA Unspecified fall, initial encounter: Secondary | ICD-10-CM | POA: Diagnosis not present

## 2016-12-23 DIAGNOSIS — M1711 Unilateral primary osteoarthritis, right knee: Secondary | ICD-10-CM | POA: Insufficient documentation

## 2016-12-23 DIAGNOSIS — M7989 Other specified soft tissue disorders: Secondary | ICD-10-CM | POA: Insufficient documentation

## 2016-12-25 DIAGNOSIS — E1122 Type 2 diabetes mellitus with diabetic chronic kidney disease: Secondary | ICD-10-CM | POA: Diagnosis not present

## 2016-12-25 DIAGNOSIS — I10 Essential (primary) hypertension: Secondary | ICD-10-CM | POA: Diagnosis not present

## 2016-12-25 DIAGNOSIS — E782 Mixed hyperlipidemia: Secondary | ICD-10-CM | POA: Diagnosis not present

## 2016-12-25 DIAGNOSIS — E039 Hypothyroidism, unspecified: Secondary | ICD-10-CM | POA: Diagnosis not present

## 2016-12-25 DIAGNOSIS — Z1159 Encounter for screening for other viral diseases: Secondary | ICD-10-CM | POA: Diagnosis not present

## 2016-12-26 ENCOUNTER — Telehealth: Payer: Self-pay | Admitting: *Deleted

## 2016-12-26 NOTE — Telephone Encounter (Signed)
Called patient with test results. No answer. Left message to call back.  

## 2016-12-26 NOTE — Telephone Encounter (Signed)
-----   Message from Evans Lance, MD sent at 12/25/2016  5:31 PM EDT ----- Let her know that her heart pumping function is essentially normal.

## 2016-12-28 DIAGNOSIS — G4733 Obstructive sleep apnea (adult) (pediatric): Secondary | ICD-10-CM | POA: Diagnosis not present

## 2016-12-28 DIAGNOSIS — F5101 Primary insomnia: Secondary | ICD-10-CM | POA: Diagnosis not present

## 2016-12-28 DIAGNOSIS — J4520 Mild intermittent asthma, uncomplicated: Secondary | ICD-10-CM | POA: Diagnosis not present

## 2016-12-28 DIAGNOSIS — I482 Chronic atrial fibrillation: Secondary | ICD-10-CM | POA: Diagnosis not present

## 2016-12-28 DIAGNOSIS — E1122 Type 2 diabetes mellitus with diabetic chronic kidney disease: Secondary | ICD-10-CM | POA: Diagnosis not present

## 2016-12-28 DIAGNOSIS — E039 Hypothyroidism, unspecified: Secondary | ICD-10-CM | POA: Diagnosis not present

## 2016-12-28 DIAGNOSIS — E782 Mixed hyperlipidemia: Secondary | ICD-10-CM | POA: Diagnosis not present

## 2016-12-28 DIAGNOSIS — I10 Essential (primary) hypertension: Secondary | ICD-10-CM | POA: Diagnosis not present

## 2016-12-28 DIAGNOSIS — D631 Anemia in chronic kidney disease: Secondary | ICD-10-CM | POA: Diagnosis not present

## 2016-12-28 DIAGNOSIS — N184 Chronic kidney disease, stage 4 (severe): Secondary | ICD-10-CM | POA: Diagnosis not present

## 2017-01-03 ENCOUNTER — Ambulatory Visit: Payer: PPO | Admitting: Pulmonary Disease

## 2017-01-04 ENCOUNTER — Ambulatory Visit (HOSPITAL_COMMUNITY)
Admission: RE | Admit: 2017-01-04 | Discharge: 2017-01-04 | Disposition: A | Discharge status: Home / Self Care | Payer: PPO | Source: Ambulatory Visit | Attending: Gastroenterology | Admitting: Gastroenterology

## 2017-01-04 ENCOUNTER — Other Ambulatory Visit (HOSPITAL_COMMUNITY)
Admission: RE | Admit: 2017-01-04 | Discharge: 2017-01-04 | Disposition: A | Discharge status: Home / Self Care | Payer: PPO | Source: Ambulatory Visit | Attending: Gastroenterology | Admitting: Gastroenterology

## 2017-01-04 ENCOUNTER — Encounter: Payer: Self-pay | Admitting: Gastroenterology

## 2017-01-04 ENCOUNTER — Ambulatory Visit (INDEPENDENT_AMBULATORY_CARE_PROVIDER_SITE_OTHER): Payer: PPO | Admitting: Gastroenterology

## 2017-01-04 VITALS — BP 120/71 | HR 42 | Temp 97.9°F | Ht <= 58 in | Wt 150.8 lb

## 2017-01-04 DIAGNOSIS — K625 Hemorrhage of anus and rectum: Secondary | ICD-10-CM

## 2017-01-04 DIAGNOSIS — E278 Other specified disorders of adrenal gland: Secondary | ICD-10-CM | POA: Diagnosis not present

## 2017-01-04 DIAGNOSIS — I517 Cardiomegaly: Secondary | ICD-10-CM | POA: Diagnosis not present

## 2017-01-04 DIAGNOSIS — N858 Other specified noninflammatory disorders of uterus: Secondary | ICD-10-CM | POA: Insufficient documentation

## 2017-01-04 DIAGNOSIS — K5900 Constipation, unspecified: Secondary | ICD-10-CM | POA: Insufficient documentation

## 2017-01-04 DIAGNOSIS — I708 Atherosclerosis of other arteries: Secondary | ICD-10-CM | POA: Insufficient documentation

## 2017-01-04 DIAGNOSIS — D649 Anemia, unspecified: Secondary | ICD-10-CM

## 2017-01-04 DIAGNOSIS — J90 Pleural effusion, not elsewhere classified: Secondary | ICD-10-CM | POA: Diagnosis not present

## 2017-01-04 DIAGNOSIS — I700 Atherosclerosis of aorta: Secondary | ICD-10-CM | POA: Insufficient documentation

## 2017-01-04 DIAGNOSIS — R1032 Left lower quadrant pain: Secondary | ICD-10-CM | POA: Insufficient documentation

## 2017-01-04 DIAGNOSIS — K5730 Diverticulosis of large intestine without perforation or abscess without bleeding: Secondary | ICD-10-CM | POA: Insufficient documentation

## 2017-01-04 LAB — BASIC METABOLIC PANEL
ANION GAP: 9 (ref 5–15)
BUN: 22 mg/dL — ABNORMAL HIGH (ref 6–20)
CALCIUM: 9.5 mg/dL (ref 8.9–10.3)
CO2: 26 mmol/L (ref 22–32)
Chloride: 100 mmol/L — ABNORMAL LOW (ref 101–111)
Creatinine, Ser: 1.63 mg/dL — ABNORMAL HIGH (ref 0.44–1.00)
GFR, EST AFRICAN AMERICAN: 39 mL/min — AB (ref >60)
GFR, EST NON AFRICAN AMERICAN: 33 mL/min — AB (ref >60)
Glucose, Bld: 114 mg/dL — ABNORMAL HIGH (ref 65–99)
Potassium: 4.3 mmol/L (ref 3.5–5.1)
Sodium: 135 mmol/L (ref 135–145)

## 2017-01-04 LAB — CBC WITH DIFFERENTIAL/PLATELET
BASOS ABS: 0.1 10*3/uL (ref 0.0–0.1)
BASOS PCT: 1 %
Eosinophils Absolute: 0.1 10*3/uL (ref 0.0–0.7)
Eosinophils Relative: 1 %
HEMATOCRIT: 31.7 % — AB (ref 36.0–46.0)
HEMOGLOBIN: 9.5 g/dL — AB (ref 12.0–15.0)
Lymphocytes Relative: 26 %
Lymphs Abs: 2.4 10*3/uL (ref 0.7–4.0)
MCH: 26.4 pg (ref 26.0–34.0)
MCHC: 30 g/dL (ref 30.0–36.0)
MCV: 88.1 fL (ref 78.0–100.0)
Monocytes Absolute: 1.1 10*3/uL — ABNORMAL HIGH (ref 0.1–1.0)
Monocytes Relative: 12 %
NEUTROS ABS: 5.5 10*3/uL (ref 1.7–7.7)
NEUTROS PCT: 60 %
Platelets: 422 10*3/uL — ABNORMAL HIGH (ref 150–400)
RBC: 3.6 MIL/uL — AB (ref 3.87–5.11)
RDW: 15.2 % (ref 11.5–15.5)
WBC: 9.1 10*3/uL (ref 4.0–10.5)

## 2017-01-04 MED ORDER — IOPAMIDOL (ISOVUE-300) INJECTION 61%
100.0000 mL | Freq: Once | INTRAVENOUS | Status: AC | PRN
  Administered 2017-01-04: 100 mL via INTRAVENOUS

## 2017-01-04 NOTE — Patient Instructions (Signed)
PA# for CT abd/pelvis: [id number]. Called and informed [facility].

## 2017-01-04 NOTE — Progress Notes (Signed)
Primary Care Physician:  Celene Squibb, MD  Primary Gastroenterologist:  Garfield Cornea, MD   Chief Complaint  Patient presents with  . Anemia    HPI:  [name] is a 61 y.o. female here urgently after past PCP for further evaluation of declining hemoglobin. Patient was seen back in February with rectal bleeding in the setting of Xarelto. Symptoms felt to be related to hemorrhoids. At that time her hemoglobin was 13.3. Arrange for her to have hemorrhoid banding the following week. She had a total of 4 bands placed and it one-month follow-up on April 10 continued to have some bleeding but noted significant improvement. It was felt that this would only gradually improve as the banding sites healed. Patient tells me that within a couple of weeks of that office visit she had recurrence of her rectal bleeding more regularly. At times she bleeds on her close. She'll note bright red blood on the toilet seat and in the water. Symptoms are intermittent. Inmate was noted that her hemoglobin had declined to 11.2 and last week was down to 9.3. Her Xarelto has been stopped, has been off for 2 weeks. She is placed back on oral iron. Her stools are now black. Patient also notes that her amiodarone was stopped by her pulmonologist about 1 month ago as it was felt that she was having some lung disease related to the medication. She has seen her cardiologist and according to Epic, she can come off her Xarelto in the setting of anemia for workup. There is concern that she will go back into A. fib now that she is off amiodarone. Next  Patient tells me of the last couple of weeks she's having increasing difficulty with dyspnea on exertion especially outside. No chest pain. Feels a flutter in her chest at times but no persisting palpitations. She also is concerned about left lower quadrant pain has been going on for a couple months but seems to be worse. Almost unable to tolerate last night and this morning. Occurs daily,  sometimes worse than others. Mild relief if she's able to have a bowel movement. Denies fever. She has a bowel movement most days with use of MiraLAX although stools are somewhat hard. Patient states she went to the gynecologist about the left lower quadrant pain but they do not feel like she has a GYN etiology for her pain or bleeding. Takes when necessary PPI for reflux.  April 2018: Hemoglobin 13.29 Oct 2016: Hemoglobin 11.2 12/04/2016: Hemoglobin 11.1 12/12/2016: Hemoglobin 10/hematocrit 32.4, MCV 89, ferritin 56, iron 18, TIBC 356, iron saturations 5% 12/26/2016: Hemoglobin 9.3, hematocrit 30.8, MCV 90  COLONOSCOPY N/A 11/30/2015  RMR: Normal terminal ileum for 10 cm, nonbleeding grade 1 internal hemorrhoids, colonic diverticulosis. Next colonoscopy in 2027.    Current Outpatient Prescriptions  Medication Sig Dispense Refill  . acetaminophen (TYLENOL) 500 MG tablet Take 500 mg by mouth every 6 (six) hours as needed.    Marland Kitchen albuterol (PROVENTIL HFA;VENTOLIN HFA) 108 (90 Base) MCG/ACT inhaler Inhale 2 puffs into the lungs every 6 (six) hours as needed for wheezing or shortness of breath. 1 Inhaler 3  . allopurinol (ZYLOPRIM) 100 MG tablet Take 100 mg by mouth 2 (two) times daily.     . calcium carbonate (OS-CAL) 600 MG TABS Take 600 mg by mouth 2 (two) times daily with a meal.      . cholecalciferol (VITAMIN D) 1000 UNITS tablet Take 1,000 Units by mouth daily.    . ferrous sulfate  325 (65 FE) MG tablet Take 325 mg by mouth daily with breakfast.    . furosemide (LASIX) 20 MG tablet TAKE ONE TABLET BY MOUTH DAILY AS NEEDED. 30 tablet 3  . glipiZIDE (GLUCOTROL) 5 MG tablet Take 5 mg by mouth daily before breakfast.     . levothyroxine (SYNTHROID, LEVOTHROID) 125 MCG tablet Take 125 mcg by mouth daily before breakfast.    . Loperamide HCl (IMODIUM A-D PO) Take by mouth as needed.    Marland Kitchen losartan (COZAAR) 50 MG tablet     . Magnesium 250 MG TABS Take 1 tablet by mouth daily.     . Omega-3 Fatty  Acids (FISH OIL PO) Take 360 mg by mouth 2 (two) times daily.    . pantoprazole (PROTONIX) 40 MG tablet Take 40 mg by mouth daily.    . polyethylene glycol (MIRALAX / GLYCOLAX) packet Take 17 g by mouth daily as needed.    . pravastatin (PRAVACHOL) 10 MG tablet Take 10 mg by mouth daily.    . Probiotic Product (ALIGN PO) Take 1 tablet by mouth daily.     . sitaGLIPtin (JANUVIA) 50 MG tablet Take 50 mg by mouth daily.    . temazepam (RESTORIL) 7.5 MG capsule Take 7.5 mg by mouth at bedtime as needed for sleep.     No current facility-administered medications for this visit.     Allergies as of 01/04/2017 - Review Complete 01/04/2017  Allergen Reaction Noted  . Flecainide Nausea Only and Other (See Comments) 11/07/2013  . Hydrocodone-acetaminophen Nausea Only and Other (See Comments)   . Ibuprofen Other (See Comments) 09/05/2011  . Oxycodone hcl Nausea Only and Other (See Comments)   . Penicillins Nausea Only and Other (See Comments)     Past Medical History:  Diagnosis Date  . Atrial fibrillation ([place]) Jan. 2015  . Chronic bronchitis   . Congenital heart disease   . Diabetes mellitus without complication ([place])    Type II  . Diverticulosis   . Fall due to stumbling October 08, 2013   Due to shoes  . GERD (gastroesophageal reflux disease)   . Gout   . Hemorrhoids   . Hyperlipidemia   . Hyperparathyroidism   . Hypertension   . Hypothyroidism   . IDA (iron deficiency anemia)   . Microscopic colitis 9/11   Colonoscopy  . Paroxysmal SVT (supraventricular tachycardia) ([place])   . PVD (peripheral vascular disease) ([place]) 09/2004   Right common femoral endarterectomy in April of 2007  . Renal insufficiency   . Schatzki's ring    Last EGD with esophageal dilatation 82F  9/11  . Scoliosis   . Varicose veins right leg pain and swelling    Past Surgical History:  Procedure Laterality Date  . ASD REPAIR  Age 39   [facility]  . CARDIOVERSION N/A  11/13/2013   Procedure: CARDIOVERSION;  Surgeon: Herminio Commons, MD;  Location: [facility];  Service: Endoscopy;  Laterality: N/A;  . COLONOSCOPY  03/04/2010   anal canal hemorrhoids otherwise normal. TI normal. Bx showed lymphocytic colitis. next TCS 02/2020  . COLONOSCOPY  April 2008   Rehman: Pancolonic diverticulosis, external hemorrhoids  . COLONOSCOPY N/A 11/30/2015   RMR: Normal terminal ileum for 10 cm, nonbleeding grade 1 internal hemorrhoids, colonic diverticulosis. Next colonoscopy in 2027.  Marland Kitchen ELAS  06-01-11   Right saphenous ELAS   . ESOPHAGEAL DILATION N/A 05/28/2015   Procedure: ESOPHAGEAL DILATION;  Surgeon: Daneil Dolin, MD;  Location: [facility];  Service: Endoscopy;  Laterality: N/A;  . ESOPHAGOGASTRODUODENOSCOPY  03/04/2010   noncritical appearing Schatzki ring. SB bx negative  . ESOPHAGOGASTRODUODENOSCOPY  05/2008   erosive reflux esophagatitis, noncritical Schatzki ring  . ESOPHAGOGASTRODUODENOSCOPY N/A 05/28/2015   [id number]:[id number] esophagus somewhat baggy, likely due to underlying esophageal motility disorder/small HH   . [place]  2010  . Left hemithyroidectomy    . Left parathyroidectomy  2007  . Parathyroid adenoma  2007  . Right common femoral endarterectomy  2007  . Small bowel capsule  12 2009   Mid to distal small bowel with edema, erosions, tiny ulceration felt to be NSAID related  . TONSILLECTOMY      Family History  Problem Relation Age of Onset  . Stroke Mother   . Heart disease Mother        CHF  - Amputation-Right Leg  . Hyperlipidemia Mother   . Hypertension Mother   . Congestive Heart Failure Mother   . Lung cancer Father   . Cancer Father        Lung  . Other Brother        back problems  . Heart attack Paternal Grandmother   . Cancer Maternal Grandmother        uterine    Social History   Social History  . Marital status: Single    Spouse name: N/A  . Number of children: 0  . Years of education: N/A   Occupational History   . office worker    Social History Main Topics  . Smoking status: Never Smoker  . Smokeless tobacco: Never Used     Comment: Never smoked  . Alcohol use No  . Drug use: No  . Sexual activity: No   Other Topics Concern  . Not on file   Social History Narrative  . No narrative on file      ROS:  General: Negative for anorexia, weight loss, fever, chills, fatigue, weakness. Eyes: Negative for vision changes.  ENT: Negative for hoarseness, difficulty swallowing , nasal congestion. CV: Negative for chest pain, angina, palpitations,peripheral edema. See history of present illness Respiratory: Negative for dyspnea at rest, cough, sputum, wheezing. See history of present illness GI: See history of present illness. GU:  Negative for dysuria, hematuria, urinary incontinence, urinary frequency, nocturnal urination.  MS: Negative for joint pain, low back pain.  Derm: Negative for rash or itching.  Neuro: Negative for weakness, abnormal sensation, seizure, frequent headaches, memory loss, confusion.  Psych: Negative for anxiety, depression, suicidal ideation, hallucinations.  Endo: Negative for unusual weight change.  Heme: Negative for bruising or bleeding. Allergy: Negative for rash or hives.    Physical Examination:  BP 120/71   Pulse (!) 42   Temp 97.9 F (36.6 C) (Oral)   Ht 4\' 8"  (1.422 m)   Wt 150 lb 12.8 oz (68.4 kg)   BMI 33.81 kg/m    General: Well-nourished, well-developed in no acute distress.  Head: Normocephalic, atraumatic.   Eyes: Conjunctiva pink, no icterus. Mouth: Oropharyngeal mucosa moist and pink , no lesions erythema or exudate. Neck: Supple without thyromegaly, masses, or lymphadenopathy.  Lungs: Clear to auscultation bilaterally.  Heart: Regular rate and rhythm, no murmurs rubs or gallops.  Abdomen: Bowel sounds are normal, moderate to severe left lower quadrant pain, nondistended, no hepatosplenomegaly or masses, no abdominal bruits or    hernia ,  no rebound. Subjective guarding.   Rectal: Not performed Extremities: No lower extremity edema. No  clubbing or deformities.  Neuro: Alert and oriented x 4 , grossly normal neurologically.  Skin: Warm and dry, no rash or jaundice.   Psych: Alert and cooperative, normal mood and affect.  Labs: See above. Also in 12/25/2016 hemoglobin A1c 7.3, HCV antibody negative, TSH 6.650, glucose 139, BUN 20, creatinine 1.59, total bilirubin 0.4, alkaline phosphatase 61, AST 19, ALT 14, albumin 3.8  Imaging Studies: US Pelvis Complete  Result Date: 12/14/2016 GYNECOLOGIC SONOGRAM [name] is a 61 y.o. G0P0000 she is here for a pelvic sonogram for postmenopausal bleeding . Uterus                      4.4 x 2.4 x 3.4 cm, atrophic homogeneous anteverted uterus,wnl Endometrium          3.7 mm, symmetrical, limited view Right ovary             3 x 1.4 x 2.1 cm, wnl Left ovary                2.2 x 2.3 x 1.4 cm, wnl No free fluid Technician Comments: T/A PELVIC ULTRASOUND:atrophic homogeneous anteverted uterus,wnl,limited view of endometrium,EEC 3.7 mm,normal ovaries bilat,transvaginal ultrasound was not done per.Anderson Malta. Her pap smear was very difficult and didn't think we would be able to do the TV. Amber Heide Guile 12/13/2016 5:06 PM Clinical Impression and recommendations: I have reviewed the sonogram results above, combined with the patient's current clinical course, below are my impressions and any appropriate recommendations for management based on the sonographic findings. Very small uterus, no anatomic abnormalities Endometrium is thin and does not require endometrial sampling Both ovaries are normal EURE,LUTHER H 12/14/2016 5:12 PM   Dg Knee Complete 4 Views Right  Result Date: 12/23/2016 CLINICAL DATA:  Right knee pain status post fall. EXAM: RIGHT KNEE - COMPLETE 4+ VIEW COMPARISON:  03/09/2015 FINDINGS: No evidence of fracture, dislocation, or joint effusion. Three compartment joint space narrowing,  marginal osteophytes, subchondral cysts and calcifications in the mediolateral joint compartments are stable. Diffuse soft tissue swelling. IMPRESSION: No acute fracture or dislocation identified about the right Knee. Chronic arthritic changes, possibly due to CPPD arthropathy. Diffuse soft tissue swelling. Electronically Signed   By: Fidela Salisbury M.D.   On: 12/23/2016 14:48

## 2017-01-04 NOTE — Patient Instructions (Signed)
1. Please go to hospital for labs and CT scan now. We will contact you with results later today as available.  2. If your breathing gets worse, you have chest pain, etc go to the ER.

## 2017-01-04 NOTE — Assessment & Plan Note (Signed)
61-year-old female with history of normocytic anemia possibly mixed with anemia chronic disease in the setting of renal insufficiency as well as some iron deficiency. Ferritin is normal but serum iron low. She has had issues with rectal bleeding in the setting of Xarelto. No bleeding past week, Xarelto stopped 2 weeks ago. She had hemorrhoid banding earlier this year but tells me that she never completely stopped bleeding. Recently had more progressive bleeding, fresh blood per rectum which would soil clothing at times even without a BM. Her last colonoscopy was 1 year ago. She had internal hemorrhoids at that time, did have colonic diverticulosis, normal terminal ileum.  She presents today with change in her hemoglobin of the last couple months. She went from 13.6 in April down to 11.2 in May because the last several weeks has dropped down to 9.3.  She may be having ongoing rectal bleeding in the setting of Xarelto due to persisting hemorrhoids. Cannot exclude diverticular bleed. Upper GI source not likely the cause of her bright red blood per rectum but cannot exclude occult GI bleeding from anywhere in the GI tract interpreting to drop her hemoglobin. Small bowel capsule endoscopy back in 2009, last EGD in 2016.  We'll recheck labs today given her dyspnea on exertion although that may be more related to her pulmonary disease. Further recommendations to follow. Of note Xarelto is currently on hold but it is felt that she will likely go back into A. fib off of amiodarone and will likely need to resume anticoagulation at some point. See cardiology note from Dr. Lovena Le dated 12/14/2016.

## 2017-01-04 NOTE — Progress Notes (Signed)
Please let patient know her Hgb is 9.5 stable from last check by Dr. Nevada Crane. Her kidney function is still elevated, not new finding.  Await ct.

## 2017-01-04 NOTE — Assessment & Plan Note (Signed)
Complains of left lower quadrant pain for a couple months but worse over the last several days. Cannot exclude acute diverticulitis. It is unclear if this has anything to do with her progressive anemia or rectal bleeding given that she's never had any imaging of her abdomen arrange for to have a stat CT abdomen and pelvis this afternoon. Also contacted radiology and spoke with CT tech, Marinus Maw, to make them aware that she does have some renal insufficiency. I want her to receive oral contrast but if it is not appropriate to receive IV contrast based on labs today and we can hold that. Further recommendations to follow.

## 2017-01-04 NOTE — Progress Notes (Signed)
Tried to call patient. LMOAM. Advised her that I will call her tonight if urgent CT findings otherwise will call in the morning with results.

## 2017-01-04 NOTE — Patient Instructions (Signed)
Pt was seen today in the office and I tried to call for a PA for the CT scan since it was STAT. Health team  [employer](her insurance company) told me that I could not do It over the phone that I would have to fax it over. I faxed over the paper work with STAT on the top at 1:15 pm and we still have not heard anything, so therfore they will not do her CT yet.

## 2017-01-04 NOTE — Progress Notes (Signed)
cc'ed to pcp °

## 2017-01-05 ENCOUNTER — Encounter (HOSPITAL_COMMUNITY): Payer: Self-pay

## 2017-01-05 ENCOUNTER — Emergency Department (HOSPITAL_COMMUNITY)
Admission: EM | Admit: 2017-01-05 | Discharge: 2017-01-05 | Disposition: A | Discharge status: Home / Self Care | Payer: PPO | Attending: Emergency Medicine | Admitting: Emergency Medicine

## 2017-01-05 DIAGNOSIS — Z7984 Long term (current) use of oral hypoglycemic drugs: Secondary | ICD-10-CM | POA: Insufficient documentation

## 2017-01-05 DIAGNOSIS — E039 Hypothyroidism, unspecified: Secondary | ICD-10-CM | POA: Diagnosis not present

## 2017-01-05 DIAGNOSIS — I2510 Atherosclerotic heart disease of native coronary artery without angina pectoris: Secondary | ICD-10-CM | POA: Insufficient documentation

## 2017-01-05 DIAGNOSIS — R1032 Left lower quadrant pain: Secondary | ICD-10-CM | POA: Diagnosis not present

## 2017-01-05 DIAGNOSIS — Z885 Allergy status to narcotic agent status: Secondary | ICD-10-CM | POA: Diagnosis not present

## 2017-01-05 DIAGNOSIS — R1031 Right lower quadrant pain: Secondary | ICD-10-CM | POA: Diagnosis not present

## 2017-01-05 DIAGNOSIS — Z880 Allergy status to penicillin: Secondary | ICD-10-CM | POA: Insufficient documentation

## 2017-01-05 DIAGNOSIS — E119 Type 2 diabetes mellitus without complications: Secondary | ICD-10-CM | POA: Insufficient documentation

## 2017-01-05 DIAGNOSIS — I5031 Acute diastolic (congestive) heart failure: Secondary | ICD-10-CM | POA: Insufficient documentation

## 2017-01-05 DIAGNOSIS — Z79899 Other long term (current) drug therapy: Secondary | ICD-10-CM | POA: Insufficient documentation

## 2017-01-05 DIAGNOSIS — I130 Hypertensive heart and chronic kidney disease with heart failure and stage 1 through stage 4 chronic kidney disease, or unspecified chronic kidney disease: Secondary | ICD-10-CM | POA: Diagnosis not present

## 2017-01-05 DIAGNOSIS — N183 Chronic kidney disease, stage 3 (moderate): Secondary | ICD-10-CM | POA: Insufficient documentation

## 2017-01-05 LAB — COMPREHENSIVE METABOLIC PANEL
ALBUMIN: 3.6 g/dL (ref 3.5–5.0)
ALT: 20 U/L (ref 14–54)
AST: 22 U/L (ref 15–41)
Alkaline Phosphatase: 62 U/L (ref 38–126)
Anion gap: 10 (ref 5–15)
BUN: 18 mg/dL (ref 6–20)
CALCIUM: 9.7 mg/dL (ref 8.9–10.3)
CHLORIDE: 101 mmol/L (ref 101–111)
CO2: 24 mmol/L (ref 22–32)
Creatinine, Ser: 1.7 mg/dL — ABNORMAL HIGH (ref 0.44–1.00)
GFR calc non Af Amer: 32 mL/min — ABNORMAL LOW (ref >60)
GFR, EST AFRICAN AMERICAN: 37 mL/min — AB (ref >60)
Glucose, Bld: 133 mg/dL — ABNORMAL HIGH (ref 65–99)
POTASSIUM: 4.1 mmol/L (ref 3.5–5.1)
Sodium: 135 mmol/L (ref 135–145)
Total Bilirubin: 0.7 mg/dL (ref 0.3–1.2)
Total Protein: 6.8 g/dL (ref 6.5–8.1)

## 2017-01-05 LAB — URINALYSIS, MICROSCOPIC (REFLEX)
Bacteria, UA: NONE SEEN
RBC / HPF: NONE SEEN RBC/hpf (ref 0–5)
WBC, UA: NONE SEEN WBC/hpf (ref 0–5)

## 2017-01-05 LAB — URINALYSIS, ROUTINE W REFLEX MICROSCOPIC
Bilirubin Urine: NEGATIVE
GLUCOSE, UA: NEGATIVE mg/dL
HGB URINE DIPSTICK: NEGATIVE
KETONES UR: NEGATIVE mg/dL
LEUKOCYTES UA: NEGATIVE
Nitrite: NEGATIVE
PROTEIN: 30 mg/dL — AB
Specific Gravity, Urine: 1.01 (ref 1.005–1.030)
pH: 8 (ref 5.0–8.0)

## 2017-01-05 LAB — CBC
HEMATOCRIT: 32.3 % — AB (ref 36.0–46.0)
HEMOGLOBIN: 9.6 g/dL — AB (ref 12.0–15.0)
MCH: 25.7 pg — AB (ref 26.0–34.0)
MCHC: 29.7 g/dL — AB (ref 30.0–36.0)
MCV: 86.4 fL (ref 78.0–100.0)
Platelets: 418 10*3/uL — ABNORMAL HIGH (ref 150–400)
RBC: 3.74 MIL/uL — AB (ref 3.87–5.11)
RDW: 15.6 % — ABNORMAL HIGH (ref 11.5–15.5)
WBC: 7 10*3/uL (ref 4.0–10.5)

## 2017-01-05 LAB — LIPASE, BLOOD: LIPASE: 29 U/L (ref 11–51)

## 2017-01-05 NOTE — Progress Notes (Signed)
Spoke with Tretha Sciara who will try to get in touch with the patient and make STAT referral.

## 2017-01-05 NOTE — Progress Notes (Signed)
See Dr. Roseanne Kaufman notation about CT.  Patient needs to see a surgeon TODAY about abnormal appendix on CT to make decision if it needs to come out.  I have tried to call patient at home number, number disconnected. I LMOAM for cell phone to have her return call.

## 2017-01-05 NOTE — ED Provider Notes (Signed)
[facility] DEPT Provider Note   CSN: [id number] Arrival date & time: 01/05/17  1216     History   Chief Complaint Chief Complaint  Patient presents with  . Abdominal Pain    HPI [name] is a 61 y.o. female.  The history is provided by the patient. No language interpreter was used.  Abdominal Pain     [name] is a 61 y.o. female who presents to the Emergency Department complaining of abdominal pain.  She was referred to the emergency department by her gastroenterologist for a CT scan concerning for appendicitis. She reports 1-2 months of left lower quadrant abdominal pain that is only present with movement and bending forward. When she is at rest she reports no abdominal pain. She has a normal appetite and is eating and drinking without difficulty. No fevers, nausea, vomiting, diarrhea, dysuria. She does have a very small amount of pain that at times spreads to her right lower quadrant as well. Past Medical History:  Diagnosis Date  . Atrial fibrillation ([place]) Jan. 2015  . Chronic bronchitis   . Congenital heart disease   . Diabetes mellitus without complication ([place])    Type II  . Diverticulosis   . Fall due to stumbling October 08, 2013   Due to shoes  . GERD (gastroesophageal reflux disease)   . Gout   . Hemorrhoids   . Hyperlipidemia   . Hyperparathyroidism   . Hypertension   . Hypothyroidism   . IDA (iron deficiency anemia)   . Microscopic colitis 9/11   Colonoscopy  . Paroxysmal SVT (supraventricular tachycardia) ([place])   . PVD (peripheral vascular disease) ([place]) 09/2004   Right common femoral endarterectomy in April of 2007  . Renal insufficiency   . Schatzki's ring    Last EGD with esophageal dilatation 55F  9/11  . Scoliosis   . Varicose veins right leg pain and swelling    Patient Active Problem List   Diagnosis Date Noted  . LLQ pain 01/04/2017  . ILD (interstitial lung disease) ([place]) 11/21/2016  . Hemorrhoids 08/21/2016  . IDA (iron  deficiency anemia) 01/11/2016  . Diverticulosis of colon without hemorrhage   . Abdominal pain, right upper quadrant 10/29/2015  . Rectal bleeding 10/29/2015  . Normocytic anemia 10/29/2015  . Dysphagia   . Abnormality of esophagus 04/29/2015  . Esophageal dysphagia 03/26/2015  . OSA (obstructive sleep apnea) 12/31/2013  . Atrial fibrillation with RVR ([place]) 11/09/2013  . Acute diastolic CHF (congestive heart failure) ([place]) 11/09/2013  . CKD (chronic kidney disease), stage III 11/09/2013  . CKD (chronic kidney disease) 11/07/2013  . Hyperkalemia 11/07/2013  . Diastolic dysfunction 92/04/9416  . DM (diabetes mellitus) ([place]) 11/07/2013  . CHF (congestive heart failure) ([place]) 11/07/2013  . Varicose veins of lower extremities with ulcer ([place]) 10/17/2013  . Cold-Left Hand / Foot 10/08/2013  . Circulation problem-Left foot 10/08/2013  . Atherosclerosis of native arteries of the extremities with ulceration(440.23) 10/08/2013  . A-fib ([place]) 08/05/2013  . Constipation 04/11/2013  . Peripheral vascular disease, unspecified ([place]) 10/27/2011  . Varicose veins of lower extremities with other complications 40/81/4481  . COLLAGENOUS COLITIS 04/08/2010  . GERD 02/18/2010  . HYPERTHYROIDISM 02/11/2010    Past Surgical History:  Procedure Laterality Date  . ASD REPAIR  Age 20   [facility]  . CARDIOVERSION N/A 11/13/2013   Procedure: CARDIOVERSION;  Surgeon: Herminio Commons, MD;  Location: [facility] ORS;  Service: Endoscopy;  Laterality: N/A;  .  COLONOSCOPY  03/04/2010   anal canal hemorrhoids otherwise normal. TI normal. Bx showed lymphocytic colitis. next TCS 02/2020  . COLONOSCOPY  April 2008   [facility]: Pancolonic diverticulosis, external hemorrhoids  . COLONOSCOPY N/A 11/30/2015   RMR: Normal terminal ileum for 10 cm, nonbleeding grade 1 internal hemorrhoids, colonic diverticulosis. Next colonoscopy in 2027.  Marland Kitchen ELAS  06-01-11   Right saphenous ELAS   . ESOPHAGEAL  DILATION N/A 05/28/2015   Procedure: ESOPHAGEAL DILATION;  Surgeon: Daneil Dolin, MD;  Location: [facility] ENDO SUITE;  Service: Endoscopy;  Laterality: N/A;  . ESOPHAGOGASTRODUODENOSCOPY  03/04/2010   noncritical appearing Schatzki ring. SB bx negative  . ESOPHAGOGASTRODUODENOSCOPY  05/2008   erosive reflux esophagatitis, noncritical Schatzki ring  . ESOPHAGOGASTRODUODENOSCOPY N/A 05/28/2015   AOZ:HYQMVHQI esophagus somewhat baggy, likely due to underlying esophageal motility disorder/small HH   . [place]  2010  . Left hemithyroidectomy    . Left parathyroidectomy  2007  . Parathyroid adenoma  2007  . Right common femoral endarterectomy  2007  . Small bowel capsule  12 2009   Mid to distal small bowel with edema, erosions, tiny ulceration felt to be NSAID related  . TONSILLECTOMY      OB History    Gravida Para Term Preterm AB Living   0 0 0 0 0 0   SAB TAB Ectopic Multiple Live Births   0 0 0 0 0       Home Medications    Prior to Admission medications   Medication Sig Start Date End Date Taking? Authorizing Provider  acetaminophen (TYLENOL) 500 MG tablet Take 500 mg by mouth every 6 (six) hours as needed.    [provider]  albuterol (PROVENTIL HFA;VENTOLIN HFA) 108 (90 Base) MCG/ACT inhaler Inhale 2 puffs into the lungs every 6 (six) hours as needed for wheezing or shortness of breath. 11/21/16   Rigoberto Noel, MD  allopurinol (ZYLOPRIM) 100 MG tablet Take 100 mg by mouth 2 (two) times daily.  09/14/10   [provider]  calcium carbonate (OS-CAL) 600 MG TABS Take 600 mg by mouth 2 (two) times daily with a meal.      [provider]  cholecalciferol (VITAMIN D) 1000 UNITS tablet Take 1,000 Units by mouth daily.    [provider]  ferrous sulfate 325 (65 FE) MG tablet Take 325 mg by mouth daily with breakfast.    [provider]  furosemide (LASIX) 20 MG tablet TAKE ONE TABLET BY MOUTH DAILY AS NEEDED. 11/15/16   Lendon Colonel, NP  glipiZIDE (GLUCOTROL) 5 MG tablet Take 5 mg by mouth daily before breakfast.     [provider]  levothyroxine (SYNTHROID, LEVOTHROID) 125 MCG tablet Take 125 mcg by mouth daily before breakfast.    [provider]  Loperamide HCl (IMODIUM A-D PO) Take by mouth as needed.    [provider]  losartan (COZAAR) 50 MG tablet  12/28/16   [provider]  Magnesium 250 MG TABS Take 1 tablet by mouth daily.     [provider]  Omega-3 Fatty Acids (FISH OIL PO) Take 360 mg by mouth 2 (two) times daily.    [provider]  pantoprazole (PROTONIX) 40 MG tablet Take 40 mg by mouth daily.    [provider]  polyethylene glycol (MIRALAX / GLYCOLAX) packet Take 17 g by mouth daily as needed.    [provider]  pravastatin (PRAVACHOL) 10 MG tablet Take 10 mg by  mouth daily.    [provider]  Probiotic Product (ALIGN PO) Take 1 tablet by mouth daily.     [provider]  sitaGLIPtin (JANUVIA) 50 MG tablet Take 50 mg by mouth daily.    [provider]  temazepam (RESTORIL) 7.5 MG capsule Take 7.5 mg by mouth at bedtime as needed for sleep.    [provider]    Family History Family History  Problem Relation Age of Onset  . Stroke Mother   . Heart disease Mother        CHF  - Amputation-Right Leg  . Hyperlipidemia Mother   . Hypertension Mother   . Congestive Heart Failure Mother   . Lung cancer Father   . Cancer Father        Lung  . Other Brother        back problems  . Heart attack Paternal Grandmother   . Cancer Maternal Grandmother        uterine    Social History Social History  Substance Use Topics  . Smoking status: Never Smoker  . Smokeless tobacco: Never Used     Comment: Never smoked  . Alcohol use No     Allergies   Flecainide; Hydrocodone-acetaminophen; Ibuprofen; Oxycodone hcl; and Penicillins   Review of Systems Review of Systems  Gastrointestinal:  Positive for abdominal pain.  All other systems reviewed and are negative.    Physical Exam Updated Vital Signs BP 125/76   Pulse 77   Temp 98 F (36.7 C)   Resp 18   SpO2 99%   Physical Exam  Constitutional: She is oriented to person, place, and time. She appears well-developed and well-nourished.  HENT:  Head: Normocephalic and atraumatic.  Cardiovascular: Normal rate and regular rhythm.   No murmur heard. Pulmonary/Chest: Effort normal and breath sounds normal. No respiratory distress.  Abdominal: Soft. There is no rebound and no guarding.  Mild lower abdominal tenderness, greatest over LLQ  Musculoskeletal: She exhibits no edema or tenderness.  Neurological: She is alert and oriented to person, place, and time.  Skin: Skin is warm and dry.  Psychiatric: She has a normal mood and affect. Her behavior is normal.  Nursing note and vitals reviewed.    ED Treatments / Results  Labs (all labs ordered are listed, but only abnormal results are displayed) Labs Reviewed  COMPREHENSIVE METABOLIC PANEL - Abnormal; Notable for the following:       Result Value   Glucose, Bld 133 (*)    Creatinine, Ser 1.70 (*)    GFR calc non Af Amer 32 (*)    GFR calc Af Amer 37 (*)    All other components within normal limits  CBC - Abnormal; Notable for the following:    RBC 3.74 (*)    Hemoglobin 9.6 (*)    HCT 32.3 (*)    MCH 25.7 (*)    MCHC 29.7 (*)    RDW 15.6 (*)    Platelets 418 (*)    All other components within normal limits  URINALYSIS, ROUTINE W REFLEX MICROSCOPIC - Abnormal; Notable for the following:    Protein, ur 30 (*)    All other components within normal limits  URINALYSIS, MICROSCOPIC (REFLEX) - Abnormal; Notable for the following:    Squamous Epithelial / LPF 0-5 (*)    All other components within normal limits  LIPASE, BLOOD    EKG  EKG Interpretation None       Radiology Ct Abdomen Pelvis  W Contrast  Result Date: 01/04/2017 CLINICAL DATA:  Left  lower quadrant and umbilical pain. Constipation. Anemia and rectal bleeding. EXAM: CT ABDOMEN AND PELVIS WITH CONTRAST TECHNIQUE: Multidetector CT imaging of the abdomen and pelvis was performed using the standard protocol following bolus administration of intravenous contrast. CONTRAST:  121mL ISOVUE-300 IOPAMIDOL (ISOVUE-300) INJECTION 61% COMPARISON:  January 21, 2010 CT scan FINDINGS: Lower chest: There is a right-sided pleural effusion. Cardiomegaly with most marked involvement of the atria, left greater than right. No suspicious nodules or masses. No focal infiltrates. Reticular changes in the bases is age indeterminate but could represent mild edema or interstitial change. Hepatobiliary: No focal liver abnormality is seen. No gallstones, gallbladder wall thickening, or biliary dilatation. Pancreas: Unremarkable. No pancreatic ductal dilatation or surrounding inflammatory changes. Spleen: Normal in size without focal abnormality. Adrenals/Urinary Tract: The left adrenal gland is normal. There appears to be a small nodule in the right adrenal gland measuring up to 11 mm, more prominent the interval. The right kidney is malrotated with no evidence obstruction or stone. There is an exophytic cyst off the left kidney on series 2, image 48 which is larger since 2011. This is consistent with a cyst with the attenuation of 16 Hounsfield units. No hydronephrosis or suspicious mass. No ureteral stones or obstruction. The bladder is unremarkable. Stomach/Bowel: Contrast in the esophagus could be due to reflux. The stomach is normal. The small bowel is normal with no obstruction. Colonic diverticulosis is seen without diverticulitis. No other colonic abnormalities are noted. No evidence of colitis. The appendix is normal proximally. The distal appendix is larger than the remainder the appendix measuring up to 8 mm. No adjacent stranding. The wall of the distal appendix may be mildly prominent which could be due to lack of  distention. The distal tip of the appendix was not seen on previous imaging. Vascular/Lymphatic: Atherosclerotic changes are seen in the non aneurysmal aorta and iliac vessels. No aneurysm or dissection. No adenopathy. Reproductive: Uterus is somewhat unusual in shape as seen on axial image 73. This was not imaged previously. A T-shaped uterus is not excluded on today's study. No ovarian mass. Other: No free air or free fluid. A fat containing umbilical hernia is noted. Musculoskeletal: Scoliotic and degenerative changes are seen in the spine. IMPRESSION: 1. The distal tip of the appendix is prominent in size measuring 8 mm. The wall is mildly prominent but this may be exaggerated due to lack of distention. There is no adjacent stranding. Given the lack of adjacent stranding an the report of left-sided pain, suspicion for appendicitis is low. Recommend clinical correlation and follow-up if clinically warranted. 2. There is a new right-sided pleural effusion. Cardiomegaly, particularly involving the atria as above. 3. Reticular changes in the lung bases could represent mild edema versus interstitial change. 4. There is an 11 mm nodule in the right adrenal gland which is more prominent in the interval but apparently present in 2011. The difference could be due to the slice selection. This is nonspecific but most likely due to a small adenoma. 5. Colonic diverticulosis without diverticulitis. 6. Atherosclerosis. 7. Uterus is unusual in shape. A T-shaped uterus which can be seen with DES exposure is not excluded. Recommend clinical correlation. Electronically Signed   By: Dorise Bullion III M.D   On: 01/04/2017 19:25    Procedures Procedures (including critical care time)  Medications Ordered in ED Medications - No data to display   Initial Impression / Assessment and Plan / ED Course  I have reviewed the triage vital signs and the nursing notes.  Pertinent labs & imaging results that were available during  my care of the patient were reviewed by me and considered in my medical decision making (see chart for details).     Patient here for evaluation of abdominal pain following outpatient CT scan performed yesterday that was abnormal. She reports 1-2 months of left lower quadrant abdominal pain that is worse with movement. She has minimal lower abdominal tenderness on examination as well as no focal peritoneal findings. She has no significant tenderness over the right lower quadrant. Discussed with general surgeon on call who evaluated the patient in the emergency department. Current clinical picture is not consistent with acute appendicitis or serious bacterial infection. Discussed with patient abdominal wall pain. Discussed rest, lifting precautions, Tylenol as needed. Discussed outpatient follow up and return precautions. Also reviewed other additional findings on CT scan with the patient.  Final Clinical Impressions(s) / ED Diagnoses   Final diagnoses:  LLQ abdominal pain    New Prescriptions Discharge Medication List as of 01/05/2017  3:31 PM       Quintella Reichert, MD 01/05/17 [phone]

## 2017-01-05 NOTE — Progress Notes (Signed)
I have spoken to patient as well. Explained CT findings. We still have some loose ends to address ie drop in Hgb, new right-sided pleural effusion, right adrenal gland nodule. She understands she needs to see surgeon today. She did reports some RLQ/periumbilical pain this morning. Advised that if symptoms worsened in interim to go to ED.

## 2017-01-05 NOTE — ED Triage Notes (Signed)
Pt reports LLQ abdominal pain that has been going on for months but went to [facility] yesterday and had a CT scan done. She was called by her GI MD and told to come to ER for possible appendicitis due to enlarged appendix according to her CT scan. Denies n/v/d/fevers.

## 2017-01-05 NOTE — Consult Note (Signed)
[facility] Surgery Consult/Admission Note  [name] Apr 16, 1956  [id number].    Requesting MD: Ralene Bathe  Chief Complaint/Reason for Consult: CT showing possible appendicitis HPI:  Patient is a 61 yo female who has had intermittent LLQ pain for 2 months. Has been seeing a GI specialist for this, and got a CT at [facility] yesterday. CT showed the distal tip of the appendix as larger than proximal appendix, but low suspicion for appendicitis. Patient was told by GI doctor to come the the ED if she had any RLQ pain. Patient felt some sharp RLQ pain yesterday, but has no pain currently. Denies fevers, chills, anorexia, n/v, diarrhea, urinary sxs. We are consulted for concern of appendicitis.   Patient has a complicated medical history. No past abdominal surgeries. On no blood thinning medications.   ROS: Review of Systems  Constitutional: Negative for chills, fever, malaise/fatigue and weight loss.  Respiratory: Negative for shortness of breath and wheezing.   Cardiovascular: Negative for chest pain and palpitations.  Gastrointestinal: Positive for abdominal pain (LLQ x 2 mos. RLQ yesterday) and blood in stool (intermittent, none for the last few weeks. ). Negative for constipation, diarrhea, melena, nausea and vomiting.  Genitourinary: Negative for dysuria, frequency and urgency.  All other systems reviewed and are negative.   Family History  Problem Relation Age of Onset  . Stroke Mother   . Heart disease Mother        CHF  - Amputation-Right Leg  . Hyperlipidemia Mother   . Hypertension Mother   . Congestive Heart Failure Mother   . Lung cancer Father   . Cancer Father        Lung  . Other Brother        back problems  . Heart attack Paternal Grandmother   . Cancer Maternal Grandmother        uterine    Past Medical History:  Diagnosis Date  . Atrial fibrillation ([place]) Jan. 2015  . Chronic bronchitis   . Congenital heart disease   . Diabetes mellitus  without complication ([place])    Type II  . Diverticulosis   . Fall due to stumbling October 08, 2013   Due to shoes  . GERD (gastroesophageal reflux disease)   . Gout   . Hemorrhoids   . Hyperlipidemia   . Hyperparathyroidism   . Hypertension   . Hypothyroidism   . IDA (iron deficiency anemia)   . Microscopic colitis 9/11   Colonoscopy  . Paroxysmal SVT (supraventricular tachycardia) ([place])   . PVD (peripheral vascular disease) ([place]) 09/2004   Right common femoral endarterectomy in April of 2007  . Renal insufficiency   . Schatzki's ring    Last EGD with esophageal dilatation 34F  9/11  . Scoliosis   . Varicose veins right leg pain and swelling    Past Surgical History:  Procedure Laterality Date  . ASD REPAIR  Age 29   [facility]  . CARDIOVERSION N/A 11/13/2013   Procedure: CARDIOVERSION;  Surgeon: Herminio Commons, MD;  Location: [facility] ORS;  Service: Endoscopy;  Laterality: N/A;  . COLONOSCOPY  03/04/2010   anal canal hemorrhoids otherwise normal. TI normal. Bx showed lymphocytic colitis. next TCS 02/2020  . COLONOSCOPY  April 2008   Rehman: Pancolonic diverticulosis, external hemorrhoids  . COLONOSCOPY N/A 11/30/2015   RMR: Normal terminal ileum for 10 cm, nonbleeding grade 1 internal hemorrhoids, colonic diverticulosis. Next colonoscopy in 2027.  Marland Kitchen ELAS  06-01-11  Right saphenous ELAS   . ESOPHAGEAL DILATION N/A 05/28/2015   Procedure: ESOPHAGEAL DILATION;  Surgeon: Daneil Dolin, MD;  Location: [facility] ENDO SUITE;  Service: Endoscopy;  Laterality: N/A;  . ESOPHAGOGASTRODUODENOSCOPY  03/04/2010   noncritical appearing Schatzki ring. SB bx negative  . ESOPHAGOGASTRODUODENOSCOPY  05/2008   erosive reflux esophagatitis, noncritical Schatzki ring  . ESOPHAGOGASTRODUODENOSCOPY N/A 05/28/2015   PYP:[id number] esophagus somewhat baggy, likely due to underlying esophageal motility disorder/small HH   . [place]  2010  . Left hemithyroidectomy    .  Left parathyroidectomy  2007  . Parathyroid adenoma  2007  . Right common femoral endarterectomy  2007  . Small bowel capsule  12 2009   Mid to distal small bowel with edema, erosions, tiny ulceration felt to be NSAID related  . TONSILLECTOMY      Social History:  reports that she has never smoked. She has never used smokeless tobacco. She reports that she does not drink alcohol or use drugs.  Allergies:  Allergies  Allergen Reactions  . Flecainide Nausea Only and Other (See Comments)    Faint feeling  . Hydrocodone-Acetaminophen Nausea Only and Other (See Comments)    Severe headache  . Ibuprofen Other (See Comments)    Kidney dysfunction  . Oxycodone Hcl Nausea Only and Other (See Comments)    Headache  . Penicillins Nausea Only and Other (See Comments)    Severe headache     (Not in a hospital admission)  Blood pressure 128/88, pulse 89, temperature 97.7 F (36.5 C), temperature source Oral, resp. rate 18, SpO2 100 %. Physical Exam: Physical Exam  Constitutional: She is oriented to person, place, and time. She appears well-developed and well-nourished. She is cooperative.  Non-toxic appearance. No distress.  HENT:  Head: Normocephalic and atraumatic.  Right Ear: External ear normal.  Left Ear: External ear normal.  Nose: Nose normal.  Mouth/Throat: Oropharynx is clear and moist and mucous membranes are normal.  Eyes: Conjunctivae and lids are normal. No scleral icterus.  Pupils equal and round  Neck: Trachea normal and normal range of motion. Neck supple.  Surgical scar  Cardiovascular: Normal rate, regular rhythm, S1 normal and S2 normal.   Pulses:      Radial pulses are 2+ on the right side, and 2+ on the left side.       Dorsalis pedis pulses are 2+ on the right side, and 2+ on the left side.  Pulmonary/Chest: Effort normal and breath sounds normal. She has no decreased breath sounds. She has no wheezes. She has no rhonchi. She has no rales.  Abdominal: Soft.  Normal appearance and bowel sounds are normal. She exhibits no distension and no mass. There is no hepatosplenomegaly. There is no tenderness. There is no rigidity, no rebound, no guarding and no tenderness at McBurney's point. No hernia.  Musculoskeletal: Normal range of motion.  No gross deformities  Neurological: She is alert and oriented to person, place, and time. She has normal strength. No sensory deficit.  Skin: Skin is warm, dry and intact. She is not diaphoretic. No pallor.  Psychiatric: She has a normal mood and affect. Her behavior is normal. Judgment normal.    Results for orders placed or performed during the hospital encounter of 01/05/17 (from the past 48 hour(s))  CBC     Status: Abnormal   Collection Time: 01/05/17 12:47 PM  Result Value Ref Range   WBC 7.0 4.0 - 10.5 K/uL   RBC 3.74 (L) 3.87 -  5.11 MIL/uL   Hemoglobin 9.6 (L) 12.0 - 15.0 g/dL   HCT 32.3 (L) 36.0 - 46.0 %   MCV 86.4 78.0 - 100.0 fL   MCH 25.7 (L) 26.0 - 34.0 pg   MCHC 29.7 (L) 30.0 - 36.0 g/dL   RDW 15.6 (H) 11.5 - 15.5 %   Platelets 418 (H) 150 - 400 K/uL   Ct Abdomen Pelvis W Contrast  Result Date: 01/04/2017 CLINICAL DATA:  Left lower quadrant and umbilical pain. Constipation. Anemia and rectal bleeding. EXAM: CT ABDOMEN AND PELVIS WITH CONTRAST TECHNIQUE: Multidetector CT imaging of the abdomen and pelvis was performed using the standard protocol following bolus administration of intravenous contrast. CONTRAST:  149mL ISOVUE-300 IOPAMIDOL (ISOVUE-300) INJECTION 61% COMPARISON:  January 21, 2010 CT scan FINDINGS: Lower chest: There is a right-sided pleural effusion. Cardiomegaly with most marked involvement of the atria, left greater than right. No suspicious nodules or masses. No focal infiltrates. Reticular changes in the bases is age indeterminate but could represent mild edema or interstitial change. Hepatobiliary: No focal liver abnormality is seen. No gallstones, gallbladder wall thickening, or biliary  dilatation. Pancreas: Unremarkable. No pancreatic ductal dilatation or surrounding inflammatory changes. Spleen: Normal in size without focal abnormality. Adrenals/Urinary Tract: The left adrenal gland is normal. There appears to be a small nodule in the right adrenal gland measuring up to 11 mm, more prominent the interval. The right kidney is malrotated with no evidence obstruction or stone. There is an exophytic cyst off the left kidney on series 2, image 48 which is larger since 2011. This is consistent with a cyst with the attenuation of 16 Hounsfield units. No hydronephrosis or suspicious mass. No ureteral stones or obstruction. The bladder is unremarkable. Stomach/Bowel: Contrast in the esophagus could be due to reflux. The stomach is normal. The small bowel is normal with no obstruction. Colonic diverticulosis is seen without diverticulitis. No other colonic abnormalities are noted. No evidence of colitis. The appendix is normal proximally. The distal appendix is larger than the remainder the appendix measuring up to 8 mm. No adjacent stranding. The wall of the distal appendix may be mildly prominent which could be due to lack of distention. The distal tip of the appendix was not seen on previous imaging. Vascular/Lymphatic: Atherosclerotic changes are seen in the non aneurysmal aorta and iliac vessels. No aneurysm or dissection. No adenopathy. Reproductive: Uterus is somewhat unusual in shape as seen on axial image 73. This was not imaged previously. A T-shaped uterus is not excluded on today's study. No ovarian mass. Other: No free air or free fluid. A fat containing umbilical hernia is noted. Musculoskeletal: Scoliotic and degenerative changes are seen in the spine. IMPRESSION: 1. The distal tip of the appendix is prominent in size measuring 8 mm. The wall is mildly prominent but this may be exaggerated due to lack of distention. There is no adjacent stranding. Given the lack of adjacent stranding an the  report of left-sided pain, suspicion for appendicitis is low. Recommend clinical correlation and follow-up if clinically warranted. 2. There is a new right-sided pleural effusion. Cardiomegaly, particularly involving the atria as above. 3. Reticular changes in the lung bases could represent mild edema versus interstitial change. 4. There is an 11 mm nodule in the right adrenal gland which is more prominent in the interval but apparently present in 2011. The difference could be due to the slice selection. This is nonspecific but most likely due to a small adenoma. 5. Colonic diverticulosis without diverticulitis. 6.  Atherosclerosis. 7. Uterus is unusual in shape. A T-shaped uterus which can be seen with DES exposure is not excluded. Recommend clinical correlation. Electronically Signed   By: Dorise Bullion III M.D   On: 01/04/2017 19:25    Assessment/Plan Concern for possible appendicitis on CT with RLQ pain - WBC 7.0, afebrile - CT with low suspicion for appendicitis - clinically no real tenderness appreciated and pain resolved  I think it is unlikely that this patient has acute appendicitis given clinical presentation. Recommend OP follow up with her regular GI doctor.   Brigid Re, [facility] Surgery 01/05/2017, 1:39 PM Pager: [phone] Consults: [phone] Mon-Fri 7:00 am-4:30 pm Sat-Sun 7:00 am-11:30 am

## 2017-01-12 NOTE — Progress Notes (Signed)
Received copy of labs from Dr. Nevada Crane dated 12/12/2016: Hemoglobin was 10, hematocrit 32.4, creatinine 1.48, iron 356, iron saturations 5% low, iron 18 low, ferritin 56. Labs from April 2018 indicated an iron of 59, iron saturation 17%, hemoglobin 13.6, hematocrit 42.4

## 2017-01-15 NOTE — Progress Notes (Signed)
Forwarding to [employer], RMR pt.

## 2017-01-19 ENCOUNTER — Ambulatory Visit: Payer: PPO | Admitting: Gastroenterology

## 2017-01-22 ENCOUNTER — Encounter: Payer: Self-pay | Admitting: Gastroenterology

## 2017-01-22 ENCOUNTER — Ambulatory Visit (INDEPENDENT_AMBULATORY_CARE_PROVIDER_SITE_OTHER): Payer: PPO | Admitting: Gastroenterology

## 2017-01-22 VITALS — BP 123/81 | HR 84 | Temp 97.6°F | Ht <= 58 in | Wt 146.6 lb

## 2017-01-22 DIAGNOSIS — D649 Anemia, unspecified: Secondary | ICD-10-CM

## 2017-01-22 DIAGNOSIS — K5900 Constipation, unspecified: Secondary | ICD-10-CM

## 2017-01-22 NOTE — Progress Notes (Signed)
Referring Provider: Celene Squibb, MD Primary Care Physician:  Celene Squibb, MD Primary GI: Dr. Gala Romney   Chief Complaint  Patient presents with  . Abdominal Pain    ruq and back pain    HPI:   [name] is a 61 y.o. female presenting today with a history of normocytic anemia, likely mixed pattern with IDA and chronic disease. Ferritin normal but iron low. Issues with rectal bleeding historically and has had hemorrhoid banding earlier this year but without real resolution in hematochezia. Hgb trending down in April, dropping down to 9 range, which is a 2 gram drop from June 2018. Feb 2018 was in 13 range. CT with concern for possible appendicitis but not felt likely after evaluation in ED. Last colonoscopy June 2017. EGD in Dec 2016, capsule study in 2009.   RUQ pain/right back pain for a few weeks, starting after falling. Back discomfort worsened with movement. Sometimes feels spasms in LLQ that is relieved with BM. LLQ pain is intermittent. Improved from when first started. Incomplete emptying with bowel movements, having to go 2-3 times. Miralax daily. No rectal bleeding lately. Saw a few weeks ago when she had to "go a whole lot". On oral iron from PCP. Stool is dark on iron. Off of both Xarelto and Amiodarone.   Past Medical History:  Diagnosis Date  . Atrial fibrillation ([place]) Jan. 2015  . Chronic bronchitis   . Congenital heart disease   . Diabetes mellitus without complication ([place])    Type II  . Diverticulosis   . Fall due to stumbling October 08, 2013   Due to shoes  . GERD (gastroesophageal reflux disease)   . Gout   . Hemorrhoids   . Hyperlipidemia   . Hyperparathyroidism   . Hypertension   . Hypothyroidism   . IDA (iron deficiency anemia)   . Microscopic colitis 9/11   Colonoscopy  . Paroxysmal SVT (supraventricular tachycardia) ([place])   . PVD (peripheral vascular disease) ([place]) 09/2004   Right common femoral endarterectomy in April of 2007  . Renal  insufficiency   . Schatzki's ring    Last EGD with esophageal dilatation 69F  9/11  . Scoliosis   . Varicose veins right leg pain and swelling    Past Surgical History:  Procedure Laterality Date  . ASD REPAIR  Age 54   [facility]  . CARDIOVERSION N/A 11/13/2013   Procedure: CARDIOVERSION;  Surgeon: Herminio Commons, MD;  Location: [facility] ORS;  Service: Endoscopy;  Laterality: N/A;  . COLONOSCOPY  03/04/2010   anal canal hemorrhoids otherwise normal. TI normal. Bx showed lymphocytic colitis. next TCS 02/2020  . COLONOSCOPY  April 2008   Rehman: Pancolonic diverticulosis, external hemorrhoids  . COLONOSCOPY N/A 11/30/2015   RMR: Normal terminal ileum for 10 cm, nonbleeding grade 1 internal hemorrhoids, colonic diverticulosis. Next colonoscopy in 2027.  Marland Kitchen ELAS  06-01-11   Right saphenous ELAS   . ESOPHAGEAL DILATION N/A 05/28/2015   Procedure: ESOPHAGEAL DILATION;  Surgeon: Daneil Dolin, MD;  Location: [facility] ENDO SUITE;  Service: Endoscopy;  Laterality: N/A;  . ESOPHAGOGASTRODUODENOSCOPY  03/04/2010   noncritical appearing Schatzki ring. SB bx negative  . ESOPHAGOGASTRODUODENOSCOPY  05/2008   erosive reflux esophagatitis, noncritical Schatzki ring  . ESOPHAGOGASTRODUODENOSCOPY N/A 05/28/2015   MPN:[id number] esophagus somewhat baggy, likely due to underlying esophageal motility disorder/small HH   . [place]  2010  . Left hemithyroidectomy    . Left parathyroidectomy  2007  . Parathyroid adenoma  2007  . Right common femoral endarterectomy  2007  . Small bowel capsule  12 2009   Mid to distal small bowel with edema, erosions, tiny ulceration felt to be NSAID related  . TONSILLECTOMY      Current Outpatient Prescriptions  Medication Sig Dispense Refill  . acetaminophen (TYLENOL) 500 MG tablet Take 500 mg by mouth every 6 (six) hours as needed.    Marland Kitchen albuterol (PROVENTIL HFA;VENTOLIN HFA) 108 (90 Base) MCG/ACT inhaler Inhale 2 puffs into the lungs  every 6 (six) hours as needed for wheezing or shortness of breath. 1 Inhaler 3  . allopurinol (ZYLOPRIM) 100 MG tablet Take 100 mg by mouth 2 (two) times daily.     . calcium carbonate (OS-CAL) 600 MG TABS Take 600 mg by mouth 2 (two) times daily with a meal.      . cholecalciferol (VITAMIN D) 1000 UNITS tablet Take 1,000 Units by mouth daily.    . ferrous sulfate 325 (65 FE) MG tablet Take 325 mg by mouth daily with breakfast.    . furosemide (LASIX) 20 MG tablet TAKE ONE TABLET BY MOUTH DAILY AS NEEDED. 30 tablet 3  . glipiZIDE (GLUCOTROL) 5 MG tablet Take 5 mg by mouth daily before breakfast.     . levothyroxine (SYNTHROID, LEVOTHROID) 125 MCG tablet Take 125 mcg by mouth daily before breakfast.    . Loperamide HCl (IMODIUM A-D PO) Take by mouth as needed.    Marland Kitchen losartan (COZAAR) 50 MG tablet Take 50 mg by mouth daily.     . Magnesium 250 MG TABS Take 1 tablet by mouth daily.     . Omega-3 Fatty Acids (FISH OIL PO) Take 360 mg by mouth 2 (two) times daily.    . pantoprazole (PROTONIX) 40 MG tablet Take 40 mg by mouth daily.    . polyethylene glycol (MIRALAX / GLYCOLAX) packet Take 17 g by mouth daily as needed.    . pravastatin (PRAVACHOL) 10 MG tablet Take 10 mg by mouth daily.    . Probiotic Product (ALIGN PO) Take 1 tablet by mouth daily.     . sitaGLIPtin (JANUVIA) 50 MG tablet Take 50 mg by mouth daily.    . temazepam (RESTORIL) 7.5 MG capsule Take 7.5 mg by mouth at bedtime as needed for sleep.     No current facility-administered medications for this visit.     Allergies as of 01/22/2017 - Review Complete 01/22/2017  Allergen Reaction Noted  . Flecainide Nausea Only and Other (See Comments) 11/07/2013  . Hydrocodone-acetaminophen Nausea Only and Other (See Comments)   . Ibuprofen Other (See Comments) 09/05/2011  . Oxycodone hcl Nausea Only and Other (See Comments)   . Penicillins Nausea Only and Other (See Comments)     Family History  Problem Relation Age of Onset  . Stroke  Mother   . Heart disease Mother        CHF  - Amputation-Right Leg  . Hyperlipidemia Mother   . Hypertension Mother   . Congestive Heart Failure Mother   . Lung cancer Father   . Cancer Father        Lung  . Other Brother        back problems  . Heart attack Paternal Grandmother   . Cancer Maternal Grandmother        uterine    Social History   Social History  . Marital status: Single    Spouse name: N/A  .  Number of children: 0  . Years of education: N/A   Occupational History  . office worker    Social History Main Topics  . Smoking status: Never Smoker  . Smokeless tobacco: Never Used     Comment: Never smoked  . Alcohol use No  . Drug use: No  . Sexual activity: No   Other Topics Concern  . None   Social History Narrative  . None    Review of Systems: As mentioned in HPI   Physical Exam: BP 123/81   Pulse 84   Temp 97.6 F (36.4 C) (Oral)   Ht 4\' 10"  (1.473 m)   Wt 146 lb 9.6 oz (66.5 kg)   BMI 30.64 kg/m  General:   Alert and oriented. No distress noted. Pleasant and cooperative.  Head:  Normocephalic and atraumatic. Eyes:  Conjuctiva clear without scleral icterus. Mouth:  Oral mucosa pink and moist. Good dentition. No lesions. Heart:  S1, S2 present without murmurs, rubs, or gallops. Regular rate and rhythm. Abdomen:  +BS, soft, non-tender and non-distended. No rebound or guarding. No HSM or masses noted. Msk:  Symmetrical without gross deformities. Normal posture. Extremities:  Without edema. Neurologic:  Alert and  oriented x4;  grossly normal neurologically. Psych:  Alert and cooperative. Normal mood and affect.  Lab Results  Component Value Date   WBC 7.0 01/05/2017   HGB 9.6 (L) 01/05/2017   HCT 32.3 (L) 01/05/2017   MCV 86.4 01/05/2017   PLT 418 (H) 01/05/2017

## 2017-01-22 NOTE — Patient Instructions (Signed)
For more productive stools: let's try Amitiza one gelcap WITH food each evening (to avoid nausea need to take with food). Increase to twice a day with food if needed.   Please check stool for blood with the kit we give to you to take home.  I will get blood work that was most recently done.  You may need an upper endoscopy with possible capsule endoscopy.   We will see you in 6 weeks!

## 2017-01-23 ENCOUNTER — Encounter: Payer: Self-pay | Admitting: Internal Medicine

## 2017-01-23 ENCOUNTER — Telehealth: Payer: Self-pay

## 2017-01-23 NOTE — Telephone Encounter (Signed)
I faxed the CT results to Dr. Nevada Crane on 01/22/2017 after I was unable to reach them by phone. I asked for a return fax to confirm it was received. I have not received anything. I just called the office @ [phone] and left Vm that I had faxed the CT report yesterday to them. And to please note the Impression #4 in reference to the adrenal gland and also #7 in reference to the uterus.

## 2017-01-24 DIAGNOSIS — D509 Iron deficiency anemia, unspecified: Secondary | ICD-10-CM | POA: Diagnosis not present

## 2017-01-24 DIAGNOSIS — E559 Vitamin D deficiency, unspecified: Secondary | ICD-10-CM | POA: Diagnosis not present

## 2017-01-24 DIAGNOSIS — N183 Chronic kidney disease, stage 3 (moderate): Secondary | ICD-10-CM | POA: Diagnosis not present

## 2017-01-24 DIAGNOSIS — I10 Essential (primary) hypertension: Secondary | ICD-10-CM | POA: Diagnosis not present

## 2017-01-25 ENCOUNTER — Other Ambulatory Visit: Payer: Self-pay

## 2017-01-25 ENCOUNTER — Ambulatory Visit (INDEPENDENT_AMBULATORY_CARE_PROVIDER_SITE_OTHER): Payer: PPO

## 2017-01-25 DIAGNOSIS — D649 Anemia, unspecified: Secondary | ICD-10-CM

## 2017-01-25 LAB — IFOBT (OCCULT BLOOD): IFOBT: NEGATIVE

## 2017-01-26 NOTE — Assessment & Plan Note (Addendum)
61 year old female with normocytic anemia in a mixed pattern in setting of renal insufficiency and iron deficiency. Rectal bleeding has tapered and no longer on Xarelto. Hemorrhoid banding earlier this year. Colonoscopy just one year ago. Stool dark, but she is on iron. Hgb has been dropping from 13 range in April, 11 in May, 9 several weeks ago. Obtaining outside labs from PCP.   Check ifobt. Obtain outside labs May need EGD +/- capsule study depending on labs Close follow-up in 6 weeks regardless.   ADDENDUM: Labs received from December 25, 2016:   TSH elevated at 6.650 BUN 20 Cr 1.59 Tbili 0.4, Alk Phos 61, AST 19, ALT 14 WBC normal 5.7, Hgb 9.3, Hct 30.8, normocytic anemia. Ferritin 56 Her ferritin is actually improved from last year, when it was 13. Her Hgb is stable from last in May. With her elevated creatinine, appears to be mixed pattern anemia. However, she has had a steady decline in Hgb since April from the 13 range down to 9. She remains on iron. Recommending EGD as last was in 2016. May or may not need capsule in future but anticipate serial monitoring of H/H and and if further decline despite iron supplementation, could consider as outpatient.  She is heme negative.   Proceed with upper endoscopy in the near future with Dr. Gala Romney. The risks, benefits, and alternatives have been discussed in detail with patient. They have stated understanding and desire to proceed.

## 2017-01-26 NOTE — Assessment & Plan Note (Signed)
Trial of Amitiza 8 mcg po BID.

## 2017-01-29 ENCOUNTER — Ambulatory Visit: Payer: PPO | Admitting: Acute Care

## 2017-01-29 NOTE — Telephone Encounter (Signed)
Noted  

## 2017-01-29 NOTE — Telephone Encounter (Signed)
I have left VM on home and mobile numbers for a return call from pt to see if she can also check with Dr. Juel Burrow office on the CT.

## 2017-01-29 NOTE — Telephone Encounter (Signed)
I have the confirmation that he the fax was successful. Will have it scanned into system.

## 2017-01-29 NOTE — Progress Notes (Signed)
cc'd to pcp 

## 2017-01-29 NOTE — Telephone Encounter (Signed)
Pt returned call and I informed her that I had faxed the copy of CT report to Dr. Nevada Crane. She could not reach them by phone, but she went to their office and they told her that they received it and they just had not had a chance to fax back to [place] that it was received, they have been so busy with their new building, etc.

## 2017-01-30 ENCOUNTER — Telehealth: Payer: Self-pay | Admitting: Gastroenterology

## 2017-01-30 NOTE — Telephone Encounter (Signed)
We need labs from PCP as soon as possible. Can we get these?

## 2017-01-30 NOTE — Telephone Encounter (Signed)
requested

## 2017-01-31 DIAGNOSIS — R809 Proteinuria, unspecified: Secondary | ICD-10-CM | POA: Diagnosis not present

## 2017-01-31 DIAGNOSIS — D649 Anemia, unspecified: Secondary | ICD-10-CM | POA: Diagnosis not present

## 2017-01-31 DIAGNOSIS — I509 Heart failure, unspecified: Secondary | ICD-10-CM | POA: Diagnosis not present

## 2017-01-31 DIAGNOSIS — N183 Chronic kidney disease, stage 3 (moderate): Secondary | ICD-10-CM | POA: Diagnosis not present

## 2017-01-31 NOTE — Telephone Encounter (Signed)
I have printed off all recent labs in [place] and placed on AB desk.

## 2017-02-01 ENCOUNTER — Other Ambulatory Visit: Payer: Self-pay

## 2017-02-01 ENCOUNTER — Ambulatory Visit: Payer: PPO | Admitting: Gastroenterology

## 2017-02-01 DIAGNOSIS — D649 Anemia, unspecified: Secondary | ICD-10-CM

## 2017-02-01 NOTE — Telephone Encounter (Signed)
Called and informed pt. She is ok with having EGD. EGD with RMR scheduled for 02/13/17 at 8:30am. Pt informed of instructions on phone. Instructions also mailed to pt. Orders entered.

## 2017-02-01 NOTE — Telephone Encounter (Signed)
Labs received from December 25, 2016:   TSH elevated at 6.650 BUN 20 Cr 1.59 Tbili 0.4, Alk Phos 61, AST 19, ALT 14 WBC normal 5.7, Hgb 9.3, Hct 30.8, normocytic anemia. Ferritin 56    Her ferritin is actually improved from last year, when it was 13.   Her Hgb is stable from last in May. With her elevated creatinine, appears to be mixed pattern anemia. However, she has had a steady decline in Hgb since April from the 13 range down to 9. She remains on iron.   I would recommend an EGD. Last was in 2016. Unclear if she will need a capsule or not. May favor waiting and following serial H/H, iron studies. She is heme negative.  Please arrange for EGD with Dr. Gala Romney. Reason: anemia.

## 2017-02-02 ENCOUNTER — Other Ambulatory Visit: Payer: Self-pay | Admitting: Gastroenterology

## 2017-02-02 NOTE — Patient Instructions (Signed)
PA info for EGD submitted via [employer]. Case suspended. Outpatient authorization# [id number], 02/02/17-05/03/17.

## 2017-02-05 NOTE — Patient Instructions (Signed)
Received fax from [facility] PA# [id number] for EGD, 02/02/17-05/03/17.

## 2017-02-09 DIAGNOSIS — N189 Chronic kidney disease, unspecified: Secondary | ICD-10-CM | POA: Diagnosis not present

## 2017-02-09 DIAGNOSIS — I10 Essential (primary) hypertension: Secondary | ICD-10-CM | POA: Diagnosis not present

## 2017-02-09 DIAGNOSIS — D649 Anemia, unspecified: Secondary | ICD-10-CM | POA: Diagnosis not present

## 2017-02-13 ENCOUNTER — Ambulatory Visit (HOSPITAL_COMMUNITY)
Admission: RE | Admit: 2017-02-13 | Discharge: 2017-02-13 | Disposition: A | Discharge status: Home / Self Care | Payer: PPO | Source: Ambulatory Visit | Attending: Internal Medicine | Admitting: Internal Medicine

## 2017-02-13 ENCOUNTER — Encounter (HOSPITAL_COMMUNITY): Payer: Self-pay | Admitting: *Deleted

## 2017-02-13 ENCOUNTER — Encounter (HOSPITAL_COMMUNITY)
Admission: RE | Disposition: A | Discharge status: Home / Self Care | Payer: Self-pay | Source: Ambulatory Visit | Attending: Internal Medicine

## 2017-02-13 DIAGNOSIS — Z7984 Long term (current) use of oral hypoglycemic drugs: Secondary | ICD-10-CM | POA: Diagnosis not present

## 2017-02-13 DIAGNOSIS — K219 Gastro-esophageal reflux disease without esophagitis: Secondary | ICD-10-CM | POA: Diagnosis not present

## 2017-02-13 DIAGNOSIS — I10 Essential (primary) hypertension: Secondary | ICD-10-CM | POA: Insufficient documentation

## 2017-02-13 DIAGNOSIS — E785 Hyperlipidemia, unspecified: Secondary | ICD-10-CM | POA: Insufficient documentation

## 2017-02-13 DIAGNOSIS — Z8719 Personal history of other diseases of the digestive system: Secondary | ICD-10-CM | POA: Diagnosis not present

## 2017-02-13 DIAGNOSIS — E1151 Type 2 diabetes mellitus with diabetic peripheral angiopathy without gangrene: Secondary | ICD-10-CM | POA: Insufficient documentation

## 2017-02-13 DIAGNOSIS — E039 Hypothyroidism, unspecified: Secondary | ICD-10-CM | POA: Diagnosis not present

## 2017-02-13 DIAGNOSIS — M109 Gout, unspecified: Secondary | ICD-10-CM | POA: Diagnosis not present

## 2017-02-13 DIAGNOSIS — I471 Supraventricular tachycardia: Secondary | ICD-10-CM | POA: Insufficient documentation

## 2017-02-13 DIAGNOSIS — Z79899 Other long term (current) drug therapy: Secondary | ICD-10-CM | POA: Diagnosis not present

## 2017-02-13 DIAGNOSIS — I4891 Unspecified atrial fibrillation: Secondary | ICD-10-CM | POA: Insufficient documentation

## 2017-02-13 DIAGNOSIS — D509 Iron deficiency anemia, unspecified: Secondary | ICD-10-CM | POA: Diagnosis not present

## 2017-02-13 DIAGNOSIS — D649 Anemia, unspecified: Secondary | ICD-10-CM

## 2017-02-13 DIAGNOSIS — K3189 Other diseases of stomach and duodenum: Secondary | ICD-10-CM

## 2017-02-13 DIAGNOSIS — M419 Scoliosis, unspecified: Secondary | ICD-10-CM | POA: Insufficient documentation

## 2017-02-13 DIAGNOSIS — K317 Polyp of stomach and duodenum: Secondary | ICD-10-CM | POA: Diagnosis not present

## 2017-02-13 HISTORY — PX: ESOPHAGOGASTRODUODENOSCOPY: SHX5428

## 2017-02-13 HISTORY — PX: BIOPSY: SHX5522

## 2017-02-13 LAB — GLUCOSE, CAPILLARY: GLUCOSE-CAPILLARY: 102 mg/dL — AB (ref 65–99)

## 2017-02-13 SURGERY — EGD (ESOPHAGOGASTRODUODENOSCOPY)
Anesthesia: Moderate Sedation

## 2017-02-13 MED ORDER — STERILE WATER FOR IRRIGATION IR SOLN
Status: DC | PRN
  Administered 2017-02-13: 09:00:00

## 2017-02-13 MED ORDER — ONDANSETRON HCL 4 MG/2ML IJ SOLN
INTRAMUSCULAR | Status: DC | PRN
  Administered 2017-02-13: 4 mg via INTRAVENOUS

## 2017-02-13 MED ORDER — ONDANSETRON HCL 4 MG/2ML IJ SOLN
INTRAMUSCULAR | Status: AC
  Filled 2017-02-13: qty 2

## 2017-02-13 MED ORDER — MEPERIDINE HCL 100 MG/ML IJ SOLN
INTRAMUSCULAR | Status: DC | PRN
  Administered 2017-02-13 (×2): 25 mg via INTRAVENOUS

## 2017-02-13 MED ORDER — MEPERIDINE HCL 100 MG/ML IJ SOLN
INTRAMUSCULAR | Status: AC
  Filled 2017-02-13: qty 2

## 2017-02-13 MED ORDER — SODIUM CHLORIDE 0.9 % IV SOLN
INTRAVENOUS | Status: DC
  Administered 2017-02-13: 08:00:00 via INTRAVENOUS

## 2017-02-13 MED ORDER — LIDOCAINE VISCOUS 2 % MT SOLN
OROMUCOSAL | Status: AC
  Filled 2017-02-13: qty 15

## 2017-02-13 MED ORDER — LIDOCAINE VISCOUS 2 % MT SOLN
OROMUCOSAL | Status: DC | PRN
  Administered 2017-02-13: 4 mL via OROMUCOSAL

## 2017-02-13 MED ORDER — MIDAZOLAM HCL 5 MG/5ML IJ SOLN
INTRAMUSCULAR | Status: AC
  Filled 2017-02-13: qty 10

## 2017-02-13 MED ORDER — MIDAZOLAM HCL 5 MG/5ML IJ SOLN
INTRAMUSCULAR | Status: DC | PRN
  Administered 2017-02-13: 2 mg via INTRAVENOUS
  Administered 2017-02-13 (×2): 1 mg via INTRAVENOUS

## 2017-02-13 NOTE — Op Note (Signed)
[facility] Patient Name: [name] Procedure Date: 02/13/2017 8:07 AM MRN: [id number] Date of Birth: [birth year] Attending MD: Norvel Richards , MD CSN: [id number] Age: 61 Admit Type: Outpatient Procedure:                Upper GI endoscopy Indications:              Unexplained iron deficiency anemia Providers:                Norvel Richards, MD, Otis Peak B. Gwenlyn Perking RN, RN,                            Aram Candela Referring MD:              Medicines:                Midazolam 4 mg IV, Meperidine 50 mg IV, Ondansetron                            4 mg IV Complications:            No immediate complications. Estimated Blood Loss:     Estimated blood loss was minimal. Procedure:                Pre-Anesthesia Assessment:                           - Prior to the procedure, a History and Physical                            was performed, and patient medications and                            allergies were reviewed. The patient's tolerance of                            previous anesthesia was also reviewed. The risks                            and benefits of the procedure and the sedation                            options and risks were discussed with the patient.                            All questions were answered, and informed consent                            was obtained. Prior Anticoagulants: The patient has                            taken no previous anticoagulant or antiplatelet                            agents. ASA Grade Assessment: II - A patient with  mild systemic disease. After reviewing the risks                            and benefits, the patient was deemed in                            satisfactory condition to undergo the procedure.                           After obtaining informed consent, the endoscope was                            passed under direct vision. Throughout the                            procedure, the  patient's blood pressure, pulse, and                            oxygen saturations were monitored continuously. The                            EG-[id number] ([id number]) scope was introduced through the                            mouth, and advanced to the second part of duodenum.                            The upper GI endoscopy was accomplished without                            difficulty. The patient tolerated the procedure                            well. Scope In: 8:45:41 AM Scope Out: 8:51:30 AM Total Procedure Duration: 0 hours 5 minutes 49 seconds  Findings:      The examined esophagus was normal.      minimal "mottling" of the gastric mucosa diffusely. No ulcer or       infiltrating process. Pylorus patent.      The duodenal bulb, second portion of the duodenum and third portion of       the duodenum were normal. Stomach and duoden biopsied with a cold       forceps for histology. Estimated blood loss was minimal. Impression:               - Normal esophagus.                           - Subtly abnormal stomach of doubtful clinical                            significance?"biopsied                           - Normal duodenal bulb, second portion of the  duodenum and third portion of the duodenum.                            Biopsied. Moderate Sedation:      Moderate (conscious) sedation was personally administered by an       anesthesia professional. The following parameters were monitored: oxygen       saturation, heart rate, blood pressure, respiratory rate, EKG, adequacy       of pulmonary ventilation, and response to care. Total physician       intraservice time was 17 minutes. Recommendation:           - Patient has a contact number available for                            emergencies. The signs and symptoms of potential                            delayed complications were discussed with the                            patient. Return to normal  activities tomorrow.                            Written discharge instructions were provided to the                            patient.                           - Resume previous diet.                           - Continue present medications.                           - Await pathology results.                           - Return to GI clinic (date not yet determined).                            May still need small bowel capsule study pending                            revie Procedure Code(s):        --- Professional ---                           [phone], Esophagogastroduodenoscopy, flexible,                            transoral; with biopsy, single or multiple Diagnosis Code(s):        --- Professional ---                           K31.89, Other diseases of stomach and duodenum  D50.9, Iron deficiency anemia, unspecified CPT copyright 2016 American Medical Association. All rights reserved. The codes documented in this report are preliminary and upon coder review may  be revised to meet current compliance requirements. Cristopher Estimable. Charnette Younkin, MD Norvel Richards, MD 02/13/2017 9:04:03 AM This report has been signed electronically. Number of Addenda: 0

## 2017-02-13 NOTE — Discharge Instructions (Addendum)

## 2017-02-13 NOTE — Interval H&P Note (Signed)
History and Physical Interval Note:  02/13/2017 8:30 AM  [name]  has presented today for surgery, with the diagnosis of anemia  The various methods of treatment have been discussed with the patient and family. After consideration of risks, benefits and other options for treatment, the patient has consented to  Procedure(s) with comments: ESOPHAGOGASTRODUODENOSCOPY (EGD) (N/A) - 8:30am as a surgical intervention .  The patient's history has been reviewed, patient examined, no change in status, stable for surgery.  I have reviewed the patient's chart and labs.  Questions were answered to the patient's satisfaction.     Moksh Loomer   No change. Patient states the rectal bleeding has subsided since hemorrhoid surgery. No dysphagia or upper GI tract symptoms of progressive anemia. EGD today per plan. The risks, benefits, limitations, alternatives and imponderables have been reviewed with the patient. Potential for esophageal dilation, biopsy, etc. have also been reviewed.  Questions have been answered. All parties agreeable.

## 2017-02-13 NOTE — H&P (View-Only) (Signed)
Referring Provider: Celene Squibb, MD Primary Care Physician:  Celene Squibb, MD Primary GI: Dr. Gala Romney   Chief Complaint  Patient presents with  . Abdominal Pain    ruq and back pain    HPI:   [name] is a 61 y.o. female presenting today with a history of normocytic anemia, likely mixed pattern with IDA and chronic disease. Ferritin normal but iron low. Issues with rectal bleeding historically and has had hemorrhoid banding earlier this year but without real resolution in hematochezia. Hgb trending down in April, dropping down to 9 range, which is a 2 gram drop from June 2018. Feb 2018 was in 13 range. CT with concern for possible appendicitis but not felt likely after evaluation in ED. Last colonoscopy June 2017. EGD in Dec 2016, capsule study in 2009.   RUQ pain/right back pain for a few weeks, starting after falling. Back discomfort worsened with movement. Sometimes feels spasms in LLQ that is relieved with BM. LLQ pain is intermittent. Improved from when first started. Incomplete emptying with bowel movements, having to go 2-3 times. Miralax daily. No rectal bleeding lately. Saw a few weeks ago when she had to "go a whole lot". On oral iron from PCP. Stool is dark on iron. Off of both Xarelto and Amiodarone.   Past Medical History:  Diagnosis Date  . Atrial fibrillation ([place]) Jan. 2015  . Chronic bronchitis   . Congenital heart disease   . Diabetes mellitus without complication ([place])    Type II  . Diverticulosis   . Fall due to stumbling October 08, 2013   Due to shoes  . GERD (gastroesophageal reflux disease)   . Gout   . Hemorrhoids   . Hyperlipidemia   . Hyperparathyroidism   . Hypertension   . Hypothyroidism   . IDA (iron deficiency anemia)   . Microscopic colitis 9/11   Colonoscopy  . Paroxysmal SVT (supraventricular tachycardia) ([place])   . PVD (peripheral vascular disease) ([place]) 09/2004   Right common femoral endarterectomy in April of 2007  . Renal  insufficiency   . Schatzki's ring    Last EGD with esophageal dilatation 56F  9/11  . Scoliosis   . Varicose veins right leg pain and swelling    Past Surgical History:  Procedure Laterality Date  . ASD REPAIR  Age 89   [facility]  . CARDIOVERSION N/A 11/13/2013   Procedure: CARDIOVERSION;  Surgeon: Herminio Commons, MD;  Location: [facility] ORS;  Service: Endoscopy;  Laterality: N/A;  . COLONOSCOPY  03/04/2010   anal canal hemorrhoids otherwise normal. TI normal. Bx showed lymphocytic colitis. next TCS 02/2020  . COLONOSCOPY  April 2008   Rehman: Pancolonic diverticulosis, external hemorrhoids  . COLONOSCOPY N/A 11/30/2015   RMR: Normal terminal ileum for 10 cm, nonbleeding grade 1 internal hemorrhoids, colonic diverticulosis. Next colonoscopy in 2027.  Marland Kitchen ELAS  06-01-11   Right saphenous ELAS   . ESOPHAGEAL DILATION N/A 05/28/2015   Procedure: ESOPHAGEAL DILATION;  Surgeon: Daneil Dolin, MD;  Location: [facility] ENDO SUITE;  Service: Endoscopy;  Laterality: N/A;  . ESOPHAGOGASTRODUODENOSCOPY  03/04/2010   noncritical appearing Schatzki ring. SB bx negative  . ESOPHAGOGASTRODUODENOSCOPY  05/2008   erosive reflux esophagatitis, noncritical Schatzki ring  . ESOPHAGOGASTRODUODENOSCOPY N/A 05/28/2015   NGE:[id number] esophagus somewhat baggy, likely due to underlying esophageal motility disorder/small HH   . [place]  2010  . Left hemithyroidectomy    . Left parathyroidectomy  2007  . Parathyroid adenoma  2007  . Right common femoral endarterectomy  2007  . Small bowel capsule  12 2009   Mid to distal small bowel with edema, erosions, tiny ulceration felt to be NSAID related  . TONSILLECTOMY      Current Outpatient Prescriptions  Medication Sig Dispense Refill  . acetaminophen (TYLENOL) 500 MG tablet Take 500 mg by mouth every 6 (six) hours as needed.    Marland Kitchen albuterol (PROVENTIL HFA;VENTOLIN HFA) 108 (90 Base) MCG/ACT inhaler Inhale 2 puffs into the lungs  every 6 (six) hours as needed for wheezing or shortness of breath. 1 Inhaler 3  . allopurinol (ZYLOPRIM) 100 MG tablet Take 100 mg by mouth 2 (two) times daily.     . calcium carbonate (OS-CAL) 600 MG TABS Take 600 mg by mouth 2 (two) times daily with a meal.      . cholecalciferol (VITAMIN D) 1000 UNITS tablet Take 1,000 Units by mouth daily.    . ferrous sulfate 325 (65 FE) MG tablet Take 325 mg by mouth daily with breakfast.    . furosemide (LASIX) 20 MG tablet TAKE ONE TABLET BY MOUTH DAILY AS NEEDED. 30 tablet 3  . glipiZIDE (GLUCOTROL) 5 MG tablet Take 5 mg by mouth daily before breakfast.     . levothyroxine (SYNTHROID, LEVOTHROID) 125 MCG tablet Take 125 mcg by mouth daily before breakfast.    . Loperamide HCl (IMODIUM A-D PO) Take by mouth as needed.    Marland Kitchen losartan (COZAAR) 50 MG tablet Take 50 mg by mouth daily.     . Magnesium 250 MG TABS Take 1 tablet by mouth daily.     . Omega-3 Fatty Acids (FISH OIL PO) Take 360 mg by mouth 2 (two) times daily.    . pantoprazole (PROTONIX) 40 MG tablet Take 40 mg by mouth daily.    . polyethylene glycol (MIRALAX / GLYCOLAX) packet Take 17 g by mouth daily as needed.    . pravastatin (PRAVACHOL) 10 MG tablet Take 10 mg by mouth daily.    . Probiotic Product (ALIGN PO) Take 1 tablet by mouth daily.     . sitaGLIPtin (JANUVIA) 50 MG tablet Take 50 mg by mouth daily.    . temazepam (RESTORIL) 7.5 MG capsule Take 7.5 mg by mouth at bedtime as needed for sleep.     No current facility-administered medications for this visit.     Allergies as of 01/22/2017 - Review Complete 01/22/2017  Allergen Reaction Noted  . Flecainide Nausea Only and Other (See Comments) 11/07/2013  . Hydrocodone-acetaminophen Nausea Only and Other (See Comments)   . Ibuprofen Other (See Comments) 09/05/2011  . Oxycodone hcl Nausea Only and Other (See Comments)   . Penicillins Nausea Only and Other (See Comments)     Family History  Problem Relation Age of Onset  . Stroke  Mother   . Heart disease Mother        CHF  - Amputation-Right Leg  . Hyperlipidemia Mother   . Hypertension Mother   . Congestive Heart Failure Mother   . Lung cancer Father   . Cancer Father        Lung  . Other Brother        back problems  . Heart attack Paternal Grandmother   . Cancer Maternal Grandmother        uterine    Social History   Social History  . Marital status: Single    Spouse name: N/A  .  Number of children: 0  . Years of education: N/A   Occupational History  . office worker    Social History Main Topics  . Smoking status: Never Smoker  . Smokeless tobacco: Never Used     Comment: Never smoked  . Alcohol use No  . Drug use: No  . Sexual activity: No   Other Topics Concern  . None   Social History Narrative  . None    Review of Systems: As mentioned in HPI   Physical Exam: BP 123/81   Pulse 84   Temp 97.6 F (36.4 C) (Oral)   Ht 4\' 10"  (1.473 m)   Wt 146 lb 9.6 oz (66.5 kg)   BMI 30.64 kg/m  General:   Alert and oriented. No distress noted. Pleasant and cooperative.  Head:  Normocephalic and atraumatic. Eyes:  Conjuctiva clear without scleral icterus. Mouth:  Oral mucosa pink and moist. Good dentition. No lesions. Heart:  S1, S2 present without murmurs, rubs, or gallops. Regular rate and rhythm. Abdomen:  +BS, soft, non-tender and non-distended. No rebound or guarding. No HSM or masses noted. Msk:  Symmetrical without gross deformities. Normal posture. Extremities:  Without edema. Neurologic:  Alert and  oriented x4;  grossly normal neurologically. Psych:  Alert and cooperative. Normal mood and affect.  Lab Results  Component Value Date   WBC 7.0 01/05/2017   HGB 9.6 (L) 01/05/2017   HCT 32.3 (L) 01/05/2017   MCV 86.4 01/05/2017   PLT 418 (H) 01/05/2017

## 2017-02-19 ENCOUNTER — Telehealth: Payer: Self-pay

## 2017-02-19 NOTE — Telephone Encounter (Signed)
Pt is calling about EGD results. Please advise

## 2017-02-20 ENCOUNTER — Encounter (HOSPITAL_COMMUNITY): Payer: Self-pay | Admitting: Internal Medicine

## 2017-02-20 ENCOUNTER — Encounter: Payer: Self-pay | Admitting: Internal Medicine

## 2017-02-20 NOTE — Telephone Encounter (Signed)
LMOM to call back

## 2017-02-20 NOTE — Telephone Encounter (Signed)
Pt is aware results  

## 2017-02-20 NOTE — Telephone Encounter (Signed)
See letter; plse let pt know

## 2017-02-21 ENCOUNTER — Telehealth: Payer: Self-pay

## 2017-02-21 NOTE — Telephone Encounter (Signed)
PATIENT ALREADY SCHEDULED FOR 02/2017

## 2017-02-21 NOTE — Telephone Encounter (Signed)
Per RMR- Need office visit in 4-6 weeks to decide about a capsule study

## 2017-02-21 NOTE — Telephone Encounter (Signed)
Please schedule ov.  

## 2017-02-23 ENCOUNTER — Ambulatory Visit (INDEPENDENT_AMBULATORY_CARE_PROVIDER_SITE_OTHER): Payer: PPO | Admitting: Adult Health

## 2017-02-23 ENCOUNTER — Encounter: Payer: Self-pay | Admitting: Adult Health

## 2017-02-23 ENCOUNTER — Ambulatory Visit (INDEPENDENT_AMBULATORY_CARE_PROVIDER_SITE_OTHER)
Admission: RE | Admit: 2017-02-23 | Discharge: 2017-02-23 | Disposition: A | Discharge status: Home / Self Care | Payer: PPO | Source: Ambulatory Visit | Attending: Adult Health | Admitting: Adult Health

## 2017-02-23 VITALS — BP 112/78 | HR 88 | Ht <= 58 in | Wt 148.2 lb

## 2017-02-23 DIAGNOSIS — J849 Interstitial pulmonary disease, unspecified: Secondary | ICD-10-CM

## 2017-02-23 DIAGNOSIS — J189 Pneumonia, unspecified organism: Secondary | ICD-10-CM

## 2017-02-23 DIAGNOSIS — R0602 Shortness of breath: Secondary | ICD-10-CM | POA: Diagnosis not present

## 2017-02-23 DIAGNOSIS — R079 Chest pain, unspecified: Secondary | ICD-10-CM | POA: Diagnosis not present

## 2017-02-23 NOTE — Assessment & Plan Note (Addendum)
ILD changes ? Edema /Amiodarone toxicity  Restrrictive changes and low dlco on PFT  Check HRCT chest  Check PFT on return in 3 montsh

## 2017-02-23 NOTE — Progress Notes (Signed)
@Patient  ID: [name], female    DOB: [birth year], 61 y.o.   MRN: [id number]  No chief complaint on file.   Referring provider: Celene Squibb, MD  HPI: 61 year old female never smoker seen 11/21/2016 for cough and shortness of breath Chronic A Fib 2015 s/p DCCV placed on amiodarone  Mild OSA w/ AHI 7/hr , CPAP intolerant  Echo June 2018, EF 80%, gr 2 diastolic dysfunction, severely dilated left atrium moderately dilated. Right atrium, pulmonary pressure 52 mmHg.  02/23/2017 Follow up : Cough /?Amiodarone Toxicity  Patient returns for a two-month follow-up.. Patient has an interstitial changes on chest x-ray with moderate restriction and decreased DLCO on PFTs. Possible pneumonia plus or minus edema versus amiodarone toxicity. Patient has had improvement on her x-ray after an antibiotics, steroids and diuresis. CXR today shows no pneumonia but mild interstitial edema . She is feeling better with decreased cough .  She denies any chest pain, orthopnea, PND, or increased leg swelling.  Allergies  Allergen Reactions  . Flecainide Nausea Only and Other (See Comments)    Faint feeling  . Hydrocodone-Acetaminophen Nausea Only and Other (See Comments)    Severe headache  . Ibuprofen Other (See Comments)    Kidney dysfunction  . Oxycodone Hcl Nausea Only and Other (See Comments)    Headache  . Penicillins Nausea Only and Other (See Comments)    Severe headache Has patient had a PCN reaction causing immediate rash, facial/tongue/throat swelling, SOB or lightheadedness with hypotension: No Has patient had a PCN reaction causing severe rash involving mucus membranes or skin necrosis: No Has patient had a PCN reaction that required hospitalization: No Has patient had a PCN reaction occurring within the last 10 years: No If all of the above answers are "NO", then may proceed with Cephalosporin use.     Immunization History  Administered Date(s) Administered  . Influenza Split  03/25/2016  . Influenza-Unspecified 03/07/2014  . Pneumococcal Conjugate-13 04/26/2015  . Zoster 05/25/2016    Past Medical History:  Diagnosis Date  . Atrial fibrillation ([place]) Jan. 2015  . Chronic bronchitis   . Congenital heart disease   . Diabetes mellitus without complication ([place])    Type II  . Diverticulosis   . Fall due to stumbling October 08, 2013   Due to shoes  . GERD (gastroesophageal reflux disease)   . Gout   . Hemorrhoids   . Hyperlipidemia   . Hyperparathyroidism   . Hypertension   . Hypothyroidism   . IDA (iron deficiency anemia)   . Microscopic colitis 9/11   Colonoscopy  . Paroxysmal SVT (supraventricular tachycardia) ([place])   . PVD (peripheral vascular disease) ([place]) 09/2004   Right common femoral endarterectomy in April of 2007  . Renal insufficiency   . Schatzki's ring    Last EGD with esophageal dilatation [name]  9/11  . Scoliosis   . Varicose veins right leg pain and swelling    Tobacco History: History  Smoking Status  . Never Smoker  Smokeless Tobacco  . Never Used    Comment: Never smoked   Counseling given: Not Answered   Outpatient Encounter Prescriptions as of 02/23/2017  Medication Sig  . acetaminophen (TYLENOL) 500 MG tablet Take 1,000 mg by mouth every 6 (six) hours as needed (for pain.).   Marland Kitchen albuterol (PROVENTIL HFA;VENTOLIN HFA) 108 (90 Base) MCG/ACT inhaler Inhale 2 puffs into the lungs every 6 (six) hours as needed for wheezing or shortness of breath.  . allopurinol (ZYLOPRIM) 100  MG tablet Take 100 mg by mouth 2 (two) times daily.   . calcium carbonate (OS-CAL) 600 MG TABS Take 600 mg by mouth 2 (two) times daily with a meal.    . cholecalciferol (VITAMIN D) 1000 UNITS tablet Take 1,000 Units by mouth daily.  . cyclobenzaprine (FLEXERIL) 5 MG tablet TAKE 1 TO 2 TABLETS BY MOUTH UP TO 3 TIMES A DAY AS NEEDED FOR SPASMS.  . ferrous sulfate 325 (65 FE) MG tablet Take 325 mg by mouth daily with breakfast.  . furosemide (LASIX) 20 MG  tablet TAKE ONE TABLET BY MOUTH DAILY AS NEEDED. (Patient taking differently: TAKE ONE TABLET BY MOUTH DAILY AS NEEDED FOR FLUID RETENTION (~EVERY OTHER DAY))  . GLIPIZIDE XL 5 MG 24 hr tablet Take 5 mg by mouth daily before breakfast.  . levothyroxine (SYNTHROID, LEVOTHROID) 125 MCG tablet Take 125 mcg by mouth daily before breakfast.  . loperamide (IMODIUM) 2 MG capsule Take 2-4 mg by mouth 4 (four) times daily as needed for diarrhea or loose stools.  Marland Kitchen losartan (COZAAR) 50 MG tablet Take 50 mg by mouth daily.   . Magnesium 250 MG TABS Take 250 mg by mouth daily.   . Omega-3 Fatty Acids (FISH OIL) 1200 MG CAPS Take 1,200 mg by mouth 2 (two) times daily.  . pantoprazole (PROTONIX) 40 MG tablet Take 40 mg by mouth 2 (two) times daily.   . polyethylene glycol (MIRALAX / GLYCOLAX) packet Take 17 g by mouth daily as needed (for constipation.).   Marland Kitchen potassium chloride (K-DUR) 10 MEQ tablet Take 10 mEq by mouth daily as needed. WITH LASIX (FLUID RETENTION) ~EVERY OTHER DAY  . pravastatin (PRAVACHOL) 10 MG tablet Take 10 mg by mouth daily.  . Probiotic Product (ALIGN PO) Take 1 tablet by mouth daily.   . sitaGLIPtin (JANUVIA) 50 MG tablet Take 50 mg by mouth daily.   No facility-administered encounter medications on file as of 02/23/2017.      Review of Systems  Constitutional:   No  weight loss, night sweats,  Fevers, chills, + fatigue, or  lassitude.  HEENT:   No headaches,  Difficulty swallowing,  Tooth/dental problems, or  Sore throat,                No sneezing, itching, ear ache, nasal congestion, post nasal drip,   CV:  No chest pain,  Orthopnea, PND, swelling in lower extremities, anasarca, dizziness, palpitations, syncope.   GI  No heartburn, indigestion, abdominal pain, nausea, vomiting, diarrhea, change in bowel habits, loss of appetite, bloody stools.   Resp: No chest wall deformity  Skin: no rash or lesions.  GU: no dysuria, change in color of urine, no urgency or frequency.   No flank pain, no hematuria   MS:  No joint pain or swelling.  No decreased range of motion.  No back pain.    Physical Exam  BP 112/78 (BP Location: Left Arm, Cuff Size: Normal)   Pulse 88   Ht 4\' 10"  (1.473 m)   Wt 148 lb 3.2 oz (67.2 kg)   SpO2 96%   BMI 30.97 kg/m   GEN: A/Ox3; pleasant , NAD,    HEENT:  [place]/AT,  EACs-clear, TMs-wnl, NOSE-clear, THROAT-clear, no lesions, no postnasal drip or exudate noted. Hearing aids , glasses   NECK:  Supple w/ fair ROM; no JVD; normal carotid impulses w/o bruits; no thyromegaly or nodules palpated; no lymphadenopathy.    RESP  Clear  P & A; w/o, wheezes/ rales/  or rhonchi. no accessory muscle use, no dullness to percussion  CARD:  RRR, no m/r/g, tr  peripheral edema, pulses intact, no cyanosis or clubbing.  GI:   Soft & nt; nml bowel sounds; no organomegaly or masses detected.   Musco: Warm bil, no deformities or joint swelling noted.   Neuro: alert, no focal deficits noted.    Skin: Warm, no lesions or rashes    Lab Results:   BMET  BNP No results found for: BNP  Imaging: Dg Chest 2 View  Result Date: 02/23/2017 CLINICAL DATA:  Shortness of breath and chest pain. EXAM: CHEST  2 VIEW COMPARISON:  01/04/2017 FINDINGS: The heart size is enlarged. There are small bilateral pleural effusions and mild interstitial edema noted. No airspace opacities identified. Scoliosis deformity noted within the thoracic spine. IMPRESSION: 1. Cardiac enlargement and mild CHF. Electronically Signed   By: Kerby Moors M.D.   On: 02/23/2017 11:11     Assessment & Plan:   No problem-specific Assessment & Plan notes found for this encounter.     Rexene Edison, NP 02/23/2017

## 2017-02-23 NOTE — Patient Instructions (Addendum)
Continue on current regimen  Set up HRCT Chest .  Follow up with Dr. Elsworth Soho  In 3 months with PFTs.

## 2017-03-08 ENCOUNTER — Ambulatory Visit (HOSPITAL_COMMUNITY)
Admission: RE | Admit: 2017-03-08 | Discharge: 2017-03-08 | Disposition: A | Discharge status: Home / Self Care | Payer: PPO | Source: Ambulatory Visit | Attending: Adult Health | Admitting: Adult Health

## 2017-03-08 DIAGNOSIS — I517 Cardiomegaly: Secondary | ICD-10-CM | POA: Diagnosis not present

## 2017-03-08 DIAGNOSIS — R918 Other nonspecific abnormal finding of lung field: Secondary | ICD-10-CM | POA: Insufficient documentation

## 2017-03-08 DIAGNOSIS — J849 Interstitial pulmonary disease, unspecified: Secondary | ICD-10-CM | POA: Diagnosis present

## 2017-03-08 DIAGNOSIS — I2510 Atherosclerotic heart disease of native coronary artery without angina pectoris: Secondary | ICD-10-CM | POA: Diagnosis not present

## 2017-03-08 DIAGNOSIS — I700 Atherosclerosis of aorta: Secondary | ICD-10-CM | POA: Diagnosis not present

## 2017-03-08 DIAGNOSIS — R0602 Shortness of breath: Secondary | ICD-10-CM | POA: Diagnosis not present

## 2017-03-08 DIAGNOSIS — R05 Cough: Secondary | ICD-10-CM | POA: Diagnosis not present

## 2017-03-13 ENCOUNTER — Other Ambulatory Visit: Payer: Self-pay | Admitting: *Deleted

## 2017-03-13 DIAGNOSIS — J849 Interstitial pulmonary disease, unspecified: Secondary | ICD-10-CM

## 2017-03-14 ENCOUNTER — Encounter: Payer: Self-pay | Admitting: Gastroenterology

## 2017-03-14 ENCOUNTER — Ambulatory Visit (INDEPENDENT_AMBULATORY_CARE_PROVIDER_SITE_OTHER): Payer: PPO | Admitting: Gastroenterology

## 2017-03-14 VITALS — BP 110/81 | HR 75 | Temp 97.8°F | Ht <= 58 in | Wt 151.0 lb

## 2017-03-14 DIAGNOSIS — D649 Anemia, unspecified: Secondary | ICD-10-CM

## 2017-03-14 NOTE — Assessment & Plan Note (Signed)
61 year old female with normocytic anemia in a mixed pattern anemia in setting of renal insufficiency and iron deficiency. Hgb has been dropping from 13 range in April and most recently in July Hgb 9. Ferritin 56. Cr 1.59. Colonoscopy on file from last year and EGD recently completed without significant findings. Will recheck CBC, iron, ferritin, TIBC now. If persistent decline, pursue capsule. If steady/improved, continue to follow.   LLQ discomfort likely secondary to constipation. Patient likes Miralax, so she will use this as needed once to BID. Call if desires prescriptive agent. Return in 4 months.

## 2017-03-14 NOTE — Patient Instructions (Signed)
Please have blood work done today.   You may increase Miralax to twice a day if needed.   Further recommendations to follow.  We will see you in 4 months regardless.

## 2017-03-14 NOTE — Progress Notes (Signed)
Referring Provider: Celene Squibb, MD Primary Care Physician:  Celene Squibb, MD  Primary GI: Dr. Gala Romney   Chief Complaint  Patient presents with  . Anemia    HPI:   [name] is a 61 y.o. female presenting today with a history of normocytic anemia in setting of renal insufficiency, IDA, with Hgb steadily dropping over last year. Ferritin improved over past year on iron.  Colonoscopy one year ago and hemorrhoid banding earlier this year. EGD just completed. She is heme negative. Overall normal findings on EGD. Here to discuss possible capsule.   Remains on iron. Stool black on iron. LLQ spasms at time. Seeing cardiology soon regarding palpitations. Off amiodarone. Only taking tylenol. Miralax daily with LLQ discomfort improved after BM. Likes the Miralax and doesn't want to change it.   Past Medical History:  Diagnosis Date  . Atrial fibrillation ([place]) Jan. 2015  . Chronic bronchitis   . Congenital heart disease   . Diabetes mellitus without complication ([place])    Type II  . Diverticulosis   . Fall due to stumbling October 08, 2013   Due to shoes  . GERD (gastroesophageal reflux disease)   . Gout   . Hemorrhoids   . Hyperlipidemia   . Hyperparathyroidism   . Hypertension   . Hypothyroidism   . IDA (iron deficiency anemia)   . Microscopic colitis 9/11   Colonoscopy  . Paroxysmal SVT (supraventricular tachycardia) ([place])   . PVD (peripheral vascular disease) ([place]) 09/2004   Right common femoral endarterectomy in April of 2007  . Renal insufficiency   . Schatzki's ring    Last EGD with esophageal dilatation 22F  9/11  . Scoliosis   . Varicose veins right leg pain and swelling    Past Surgical History:  Procedure Laterality Date  . ASD REPAIR  Age 28   [facility]  . BIOPSY  02/13/2017   Procedure: BIOPSY;  Surgeon: Daneil Dolin, MD;  Location: [facility];  Service: Endoscopy;;  duodenum gastric  . CARDIOVERSION N/A  11/13/2013   Procedure: CARDIOVERSION;  Surgeon: Herminio Commons, MD;  Location: [facility] ORS;  Service: Endoscopy;  Laterality: N/A;  . COLONOSCOPY  03/04/2010   anal canal hemorrhoids otherwise normal. TI normal. Bx showed lymphocytic colitis. next TCS 02/2020  . COLONOSCOPY  April 2008   Rehman: Pancolonic diverticulosis, external hemorrhoids  . COLONOSCOPY N/A 11/30/2015   RMR: Normal terminal ileum for 10 cm, nonbleeding grade 1 internal hemorrhoids, colonic diverticulosis. Next colonoscopy in 2027.  Marland Kitchen ELAS  06-01-11   Right saphenous ELAS   . ESOPHAGEAL DILATION N/A 05/28/2015   Procedure: ESOPHAGEAL DILATION;  Surgeon: Daneil Dolin, MD;  Location: [facility];  Service: Endoscopy;  Laterality: N/A;  . ESOPHAGOGASTRODUODENOSCOPY  03/04/2010   noncritical appearing Schatzki ring. SB bx negative  . ESOPHAGOGASTRODUODENOSCOPY  05/2008   erosive reflux esophagatitis, noncritical Schatzki ring  . ESOPHAGOGASTRODUODENOSCOPY N/A 05/28/2015   TDD:UKGURKYH esophagus somewhat baggy, likely due to underlying esophageal motility disorder/small HH   . ESOPHAGOGASTRODUODENOSCOPY N/A 02/13/2017   Dr. Gala Romney: subtly abnormal stomach of doubtful clinical significant s/p biopsy, normal duodenum s/p biopsy, fundic gland polyp on path, normal duodenal biopsy  . [place]  2010  . Left hemithyroidectomy    . Left parathyroidectomy  2007  . Parathyroid adenoma  2007  . Right common femoral endarterectomy  2007  . Small bowel capsule  12 2009   Mid  to distal small bowel with edema, erosions, tiny ulceration felt to be NSAID related  . TONSILLECTOMY      Current Outpatient Prescriptions  Medication Sig Dispense Refill  . acetaminophen (TYLENOL) 500 MG tablet Take 1,000 mg by mouth every 6 (six) hours as needed (for pain.).     Marland Kitchen albuterol (PROVENTIL HFA;VENTOLIN HFA) 108 (90 Base) MCG/ACT inhaler Inhale 2 puffs into the lungs every 6 (six) hours as needed for wheezing or shortness of breath. 1 Inhaler 3   . allopurinol (ZYLOPRIM) 100 MG tablet Take 100 mg by mouth 2 (two) times daily.     . calcium carbonate (OS-CAL) 600 MG TABS Take 600 mg by mouth 2 (two) times daily with a meal.      . cholecalciferol (VITAMIN D) 1000 UNITS tablet Take 1,000 Units by mouth daily.    . ferrous sulfate 325 (65 FE) MG tablet Take 325 mg by mouth daily with breakfast.    . furosemide (LASIX) 20 MG tablet TAKE ONE TABLET BY MOUTH DAILY AS NEEDED. (Patient taking differently: TAKE ONE TABLET BY MOUTH DAILY AS NEEDED FOR FLUID RETENTION (~EVERY OTHER DAY)) 30 tablet 3  . GLIPIZIDE XL 5 MG 24 hr tablet Take 5 mg by mouth daily before breakfast.    . levothyroxine (SYNTHROID, LEVOTHROID) 125 MCG tablet Take 125 mcg by mouth daily before breakfast.    . loperamide (IMODIUM) 2 MG capsule Take 2-4 mg by mouth 4 (four) times daily as needed for diarrhea or loose stools.    Marland Kitchen losartan (COZAAR) 50 MG tablet Take 50 mg by mouth daily.     . Magnesium 250 MG TABS Take 250 mg by mouth daily.     . Omega-3 Fatty Acids (FISH OIL) 1200 MG CAPS Take 1,200 mg by mouth 2 (two) times daily.    . pantoprazole (PROTONIX) 40 MG tablet Take 40 mg by mouth 2 (two) times daily.     . polyethylene glycol (MIRALAX / GLYCOLAX) packet Take 17 g by mouth daily as needed (for constipation.).     Marland Kitchen potassium chloride (K-DUR) 10 MEQ tablet Take 10 mEq by mouth daily as needed. WITH LASIX (FLUID RETENTION) ~EVERY OTHER DAY    . pravastatin (PRAVACHOL) 10 MG tablet Take 10 mg by mouth daily.    . Probiotic Product (ALIGN PO) Take 1 tablet by mouth daily.     . sitaGLIPtin (JANUVIA) 50 MG tablet Take 50 mg by mouth daily.     No current facility-administered medications for this visit.     Allergies as of 03/14/2017 - Review Complete 03/14/2017  Allergen Reaction Noted  . Flecainide Nausea Only and Other (See Comments) 11/07/2013  . Hydrocodone-acetaminophen Nausea Only and Other (See Comments)   . Ibuprofen Other (See Comments) 09/05/2011  .  Oxycodone hcl Nausea Only and Other (See Comments)   . Penicillins Nausea Only and Other (See Comments)     Family History  Problem Relation Age of Onset  . Stroke Mother   . Heart disease Mother        CHF  - Amputation-Right Leg  . Hyperlipidemia Mother   . Hypertension Mother   . Congestive Heart Failure Mother   . Lung cancer Father   . Cancer Father        Lung  . Other Brother        back problems  . Heart attack Paternal Grandmother   . Cancer Maternal Grandmother        uterine  Social History   Social History  . Marital status: Single    Spouse name: N/A  . Number of children: 0  . Years of education: N/A   Occupational History  . office worker    Social History Main Topics  . Smoking status: Never Smoker  . Smokeless tobacco: Never Used     Comment: Never smoked  . Alcohol use No  . Drug use: No  . Sexual activity: No   Other Topics Concern  . None   Social History Narrative  . None    Review of Systems: Gen: Denies fever, chills, anorexia. Denies fatigue, weakness, weight loss.  CV: see HPI  Resp: Denies dyspnea at rest, cough, wheezing, coughing up blood, and pleurisy. GI: see HPI  Derm: Denies rash, itching, dry skin Psych: Denies depression, anxiety, memory loss, confusion. No homicidal or suicidal ideation.  Heme: Denies bruising, bleeding, and enlarged lymph nodes.  Physical Exam: BP 110/81   Pulse 75   Temp 97.8 F (36.6 C) (Oral)   Ht 4\' 10"  (1.473 m)   Wt 151 lb (68.5 kg)   BMI 31.56 kg/m  General:   Alert and oriented. No distress noted. Pleasant and cooperative.  Head:  Normocephalic and atraumatic. Eyes:  Conjuctiva clear without scleral icterus. Mouth:  Oral mucosa pink and moist.  Abdomen:  +BS, soft, non-tender and non-distended. No rebound or guarding. No HSM or masses noted. Extremities:  Without edema. Neurologic:  Alert and  oriented x4 Psych:  Alert and cooperative. Normal mood and affect.

## 2017-03-14 NOTE — Progress Notes (Signed)
cc'ed to pcp °

## 2017-03-15 LAB — CBC WITH DIFFERENTIAL/PLATELET
BASOS ABS: 78 {cells}/uL (ref 0–200)
Basophils Relative: 1 %
EOS PCT: 3.6 %
Eosinophils Absolute: 281 cells/uL (ref 15–500)
HEMATOCRIT: 36.5 % (ref 35.0–45.0)
Hemoglobin: 11.7 g/dL (ref 11.7–15.5)
LYMPHS ABS: 1989 {cells}/uL (ref 850–3900)
MCH: 27.5 pg (ref 27.0–33.0)
MCHC: 32.1 g/dL (ref 32.0–36.0)
MCV: 85.7 fL (ref 80.0–100.0)
MPV: 11.8 fL (ref 7.5–12.5)
Monocytes Relative: 9.1 %
NEUTROS PCT: 60.8 %
Neutro Abs: 4742 cells/uL (ref 1500–7800)
PLATELETS: 246 10*3/uL (ref 140–400)
RBC: 4.26 10*6/uL (ref 3.80–5.10)
RDW: 18 % — AB (ref 11.0–15.0)
Total Lymphocyte: 25.5 %
WBC mixed population: 710 cells/uL (ref 200–950)
WBC: 7.8 10*3/uL (ref 3.8–10.8)

## 2017-03-15 LAB — IRON, TOTAL/TOTAL IRON BINDING CAP
%SAT: 8 % — AB (ref 11–50)
IRON: 24 ug/dL — AB (ref 45–160)
TIBC: 307 ug/dL (ref 250–450)

## 2017-03-15 LAB — FERRITIN: Ferritin: 45 ng/mL (ref 20–288)

## 2017-03-16 ENCOUNTER — Ambulatory Visit (INDEPENDENT_AMBULATORY_CARE_PROVIDER_SITE_OTHER): Payer: PPO | Admitting: Internal Medicine

## 2017-03-16 ENCOUNTER — Encounter: Payer: Self-pay | Admitting: Internal Medicine

## 2017-03-16 VITALS — BP 112/70 | HR 45 | Ht 58.5 in | Wt 151.0 lb

## 2017-03-16 DIAGNOSIS — I480 Paroxysmal atrial fibrillation: Secondary | ICD-10-CM | POA: Diagnosis not present

## 2017-03-16 MED ORDER — METOPROLOL SUCCINATE ER 25 MG PO TB24
25.0000 mg | ORAL_TABLET | Freq: Every day | ORAL | 3 refills | Status: DC
Start: 2017-03-16 — End: 2017-05-17

## 2017-03-16 NOTE — Patient Instructions (Signed)
Medication Instructions:  Your physician has recommended you make the following change in your medication:  Start Toprol XL 25 mg Daily    Labwork: NONE   Testing/Procedures: NONE   Follow-Up: Your physician recommends that you schedule a follow-up appointment in: 2-3 Months with Dr. Lovena Le.    Any Other Special Instructions Will Be Listed Below (If Applicable).     If you need a refill on your cardiac medications before your next appointment, please call your pharmacy.  Thank you for choosing [facility]!

## 2017-03-16 NOTE — Progress Notes (Signed)
HPI [name] returns today for ongoing evaluation of atrial fibrillation and probable toxicity to amiodarone. The patient is a 61 year old woman who has a history of persistent atrial fibrillation, chronic diastolic heart failure made much worse by atrial fibrillation, hypertension, and anemia. She developed evidence of pulmonary toxicity from amiodarone and this was discontinued approximately 4 months ago. In the interim, she has had a 2-D echo demonstrating preserved left ventricular systolic function. The patient had been on oral anticoagulation and this was discontinued due to anemia with a decreasing hemoglobin. Since her amiodarone was discontinued, her dyspnea has improved as has her cough. She has noted some palpitations which when they occur are associated with dyspnea. She has mild swelling in her legs. Her cough seems better. Allergies  Allergen Reactions  . Flecainide Nausea Only and Other (See Comments)    Faint feeling  . Hydrocodone-Acetaminophen Nausea Only and Other (See Comments)    Severe headache  . Ibuprofen Other (See Comments)    Kidney dysfunction  . Oxycodone Hcl Nausea Only and Other (See Comments)    Headache  . Penicillins Nausea Only and Other (See Comments)    Severe headache Has patient had a PCN reaction causing immediate rash, facial/tongue/throat swelling, SOB or lightheadedness with hypotension: No Has patient had a PCN reaction causing severe rash involving mucus membranes or skin necrosis: No Has patient had a PCN reaction that required hospitalization: No Has patient had a PCN reaction occurring within the last 10 years: No If all of the above answers are "NO", then may proceed with Cephalosporin use.      Current Outpatient Prescriptions  Medication Sig Dispense Refill  . acetaminophen (TYLENOL) 500 MG tablet Take 1,000 mg by mouth every 6 (six) hours as needed (for pain.).     Marland Kitchen albuterol (PROVENTIL HFA;VENTOLIN HFA) 108 (90 Base) MCG/ACT  inhaler Inhale 2 puffs into the lungs every 6 (six) hours as needed for wheezing or shortness of breath. 1 Inhaler 3  . allopurinol (ZYLOPRIM) 100 MG tablet Take 100 mg by mouth 2 (two) times daily.     . calcium carbonate (OS-CAL) 600 MG TABS Take 600 mg by mouth 2 (two) times daily with a meal.      . cholecalciferol (VITAMIN D) 1000 UNITS tablet Take 1,000 Units by mouth daily.    . ferrous sulfate 325 (65 FE) MG tablet Take 325 mg by mouth daily with breakfast.    . furosemide (LASIX) 20 MG tablet TAKE ONE TABLET BY MOUTH DAILY AS NEEDED. (Patient taking differently: TAKE ONE TABLET BY MOUTH DAILY AS NEEDED FOR FLUID RETENTION (~EVERY OTHER DAY)) 30 tablet 3  . GLIPIZIDE XL 5 MG 24 hr tablet Take 5 mg by mouth daily before breakfast.    . levothyroxine (SYNTHROID, LEVOTHROID) 125 MCG tablet Take 125 mcg by mouth daily before breakfast.    . loperamide (IMODIUM) 2 MG capsule Take 2-4 mg by mouth 4 (four) times daily as needed for diarrhea or loose stools.    Marland Kitchen losartan (COZAAR) 50 MG tablet Take 50 mg by mouth daily.     . Magnesium 250 MG TABS Take 250 mg by mouth daily.     . Omega-3 Fatty Acids (FISH OIL) 1200 MG CAPS Take 1,200 mg by mouth 2 (two) times daily.    . pantoprazole (PROTONIX) 40 MG tablet Take 40 mg by mouth 2 (two) times daily.     . polyethylene glycol (MIRALAX / GLYCOLAX) packet Take 17 g  by mouth daily as needed (for constipation.).     Marland Kitchen potassium chloride (K-DUR) 10 MEQ tablet Take 10 mEq by mouth daily as needed. WITH LASIX (FLUID RETENTION) ~EVERY OTHER DAY    . pravastatin (PRAVACHOL) 10 MG tablet Take 10 mg by mouth daily.    . Probiotic Product (ALIGN PO) Take 1 tablet by mouth daily.     . sitaGLIPtin (JANUVIA) 50 MG tablet Take 50 mg by mouth daily.     No current facility-administered medications for this visit.      Past Medical History:  Diagnosis Date  . Atrial fibrillation ([place]) Jan. 2015  . Chronic bronchitis   . Congenital heart disease   . Diabetes  mellitus without complication ([place])    Type II  . Diverticulosis   . Fall due to stumbling October 08, 2013   Due to shoes  . GERD (gastroesophageal reflux disease)   . Gout   . Hemorrhoids   . Hyperlipidemia   . Hyperparathyroidism   . Hypertension   . Hypothyroidism   . IDA (iron deficiency anemia)   . Microscopic colitis 9/11   Colonoscopy  . Paroxysmal SVT (supraventricular tachycardia) ([place])   . PVD (peripheral vascular disease) ([place]) 09/2004   Right common femoral endarterectomy in April of 2007  . Renal insufficiency   . Schatzki's ring    Last EGD with esophageal dilatation 62F  9/11  . Scoliosis   . Varicose veins right leg pain and swelling    ROS:   All systems reviewed and negative except as noted in the HPI.   Past Surgical History:  Procedure Laterality Date  . ASD REPAIR  Age 26   [facility]  . BIOPSY  02/13/2017   Procedure: BIOPSY;  Surgeon: Daneil Dolin, MD;  Location: [facility];  Service: Endoscopy;;  duodenum gastric  . CARDIOVERSION N/A 11/13/2013   Procedure: CARDIOVERSION;  Surgeon: Herminio Commons, MD;  Location: [facility] ORS;  Service: Endoscopy;  Laterality: N/A;  . COLONOSCOPY  03/04/2010   anal canal hemorrhoids otherwise normal. TI normal. Bx showed lymphocytic colitis. next TCS 02/2020  . COLONOSCOPY  April 2008   Rehman: Pancolonic diverticulosis, external hemorrhoids  . COLONOSCOPY N/A 11/30/2015   RMR: Normal terminal ileum for 10 cm, nonbleeding grade 1 internal hemorrhoids, colonic diverticulosis. Next colonoscopy in 2027.  Marland Kitchen ELAS  06-01-11   Right saphenous ELAS   . ESOPHAGEAL DILATION N/A 05/28/2015   Procedure: ESOPHAGEAL DILATION;  Surgeon: Daneil Dolin, MD;  Location: [facility];  Service: Endoscopy;  Laterality: N/A;  . ESOPHAGOGASTRODUODENOSCOPY  03/04/2010   noncritical appearing Schatzki ring. SB bx negative  . ESOPHAGOGASTRODUODENOSCOPY  05/2008   erosive reflux esophagatitis, noncritical  Schatzki ring  . ESOPHAGOGASTRODUODENOSCOPY N/A 05/28/2015   HQI:[id number] esophagus somewhat baggy, likely due to underlying esophageal motility disorder/small HH   . ESOPHAGOGASTRODUODENOSCOPY N/A 02/13/2017   Dr. Gala Romney: subtly abnormal stomach of doubtful clinical significant s/p biopsy, normal duodenum s/p biopsy, fundic gland polyp on path, normal duodenal biopsy  . [place]  2010  . Left hemithyroidectomy    . Left parathyroidectomy  2007  . Parathyroid adenoma  2007  . Right common femoral endarterectomy  2007  . Small bowel capsule  12 2009   Mid to distal small bowel with edema, erosions, tiny ulceration felt to be NSAID related  . TONSILLECTOMY       Family History  Problem Relation Age of Onset  . Stroke Mother   .  Heart disease Mother        CHF  - Amputation-Right Leg  . Hyperlipidemia Mother   . Hypertension Mother   . Congestive Heart Failure Mother   . Lung cancer Father   . Cancer Father        Lung  . Other Brother        back problems  . Heart attack Paternal Grandmother   . Cancer Maternal Grandmother        uterine     Social History   Social History  . Marital status: Single    Spouse name: N/A  . Number of children: 0  . Years of education: N/A   Occupational History  . office worker    Social History Main Topics  . Smoking status: Never Smoker  . Smokeless tobacco: Never Used     Comment: Never smoked  . Alcohol use No  . Drug use: No  . Sexual activity: No   Other Topics Concern  . Not on file   Social History Narrative  . No narrative on file     Ht 4' 10.5" (1.486 m)   Wt 151 lb (68.5 kg)   BMI 31.02 kg/m   Physical Exam:  Stable appearing NAD HEENT: Unremarkable Neck:  7 cm JVD, no thyromegally Lymphatics:  No adenopathy Back:  No CVA tenderness Lungs:  Clear, except for scattered rales with no wheezes or rhonchi. HEART:  IRegular rate rhythm, no murmurs, no rubs, no clicks Abd:  soft, positive bowel  sounds, no organomegally, no rebound, no guarding Ext:  2 plus pulses, no edema, no cyanosis, no clubbing Skin:  No rashes no nodules Neuro:  CN II through XII intact, motor grossly intact  EKG - MAT   Assess/Plan: 1. Persistent atrial fibrillation - she is in transition back to atrial fibrillation. We will try a strategy of rate control. We discussed initiation of dofetilide. I will see her back in 2 months and see how she is doing and at that time make a decision about inpatient initiation of dofetilide. Her QT interval appear satisfactory. We also discussed AV node ablation and his bundle pacemaker insertion. 2. Amiodarone lung toxicity versus interstitial lung disease versus atypical pneumonia - her amiodarone has been stopped, but still present in her system. Her cough appears to be better. She will undergo watchful waiting. 3. Chronic diastolic heart failure - her symptoms are class II. Her ejection fraction is preserved. She admits to sodium indiscretion, and I've strongly encouraged the patient to eat less salt. We will use diuretics as needed in the future. She is currently taking 20 mg a day. 4. Anticoagulation - the patient has an indication for systemic anticoagulation with her heart failure and hypertension and female gender, but refuses.   Cristopher Peru, M.D.

## 2017-03-20 NOTE — Progress Notes (Signed)
Ferritin was 56 in July. Slight drift down to 45 but Hgb has improved. Let's have her continue oral iron and repeat in 6 weeks. Hold on capsule just yet but may still need.

## 2017-03-23 DIAGNOSIS — Z23 Encounter for immunization: Secondary | ICD-10-CM | POA: Diagnosis not present

## 2017-03-28 ENCOUNTER — Other Ambulatory Visit: Payer: Self-pay | Admitting: Gastroenterology

## 2017-03-28 DIAGNOSIS — D509 Iron deficiency anemia, unspecified: Secondary | ICD-10-CM

## 2017-04-20 DIAGNOSIS — I10 Essential (primary) hypertension: Secondary | ICD-10-CM | POA: Diagnosis not present

## 2017-04-20 DIAGNOSIS — R002 Palpitations: Secondary | ICD-10-CM | POA: Diagnosis not present

## 2017-04-20 DIAGNOSIS — M5440 Lumbago with sciatica, unspecified side: Secondary | ICD-10-CM | POA: Diagnosis not present

## 2017-04-20 DIAGNOSIS — Z9181 History of falling: Secondary | ICD-10-CM | POA: Diagnosis not present

## 2017-04-24 ENCOUNTER — Other Ambulatory Visit: Payer: Self-pay

## 2017-04-24 DIAGNOSIS — D509 Iron deficiency anemia, unspecified: Secondary | ICD-10-CM

## 2017-04-30 DIAGNOSIS — D509 Iron deficiency anemia, unspecified: Secondary | ICD-10-CM | POA: Diagnosis not present

## 2017-04-30 DIAGNOSIS — E559 Vitamin D deficiency, unspecified: Secondary | ICD-10-CM | POA: Diagnosis not present

## 2017-04-30 DIAGNOSIS — N183 Chronic kidney disease, stage 3 (moderate): Secondary | ICD-10-CM | POA: Diagnosis not present

## 2017-04-30 DIAGNOSIS — I10 Essential (primary) hypertension: Secondary | ICD-10-CM | POA: Diagnosis not present

## 2017-05-01 ENCOUNTER — Telehealth: Payer: Self-pay | Admitting: Internal Medicine

## 2017-05-01 ENCOUNTER — Other Ambulatory Visit: Payer: Self-pay | Admitting: Gastroenterology

## 2017-05-01 NOTE — Telephone Encounter (Signed)
[place], SHE HAS QUESTIONS ABOUT LABS

## 2017-05-01 NOTE — Telephone Encounter (Signed)
Spoke with pt, she received a letter stating that she needed lab work and pt wanted to know why she needed to get labs drawn. Prior results given to pt. Pt is ok with getting labs as requested.

## 2017-05-03 DIAGNOSIS — I10 Essential (primary) hypertension: Secondary | ICD-10-CM | POA: Diagnosis not present

## 2017-05-03 DIAGNOSIS — R0602 Shortness of breath: Secondary | ICD-10-CM | POA: Diagnosis not present

## 2017-05-03 DIAGNOSIS — Z6831 Body mass index (BMI) 31.0-31.9, adult: Secondary | ICD-10-CM | POA: Diagnosis not present

## 2017-05-03 DIAGNOSIS — R002 Palpitations: Secondary | ICD-10-CM | POA: Diagnosis not present

## 2017-05-03 DIAGNOSIS — M545 Low back pain: Secondary | ICD-10-CM | POA: Diagnosis not present

## 2017-05-03 DIAGNOSIS — M5430 Sciatica, unspecified side: Secondary | ICD-10-CM | POA: Diagnosis not present

## 2017-05-03 DIAGNOSIS — N184 Chronic kidney disease, stage 4 (severe): Secondary | ICD-10-CM | POA: Diagnosis not present

## 2017-05-03 MED ORDER — CYCLOBENZAPRINE HCL 5 MG PO TABS: ORAL_TABLET | ORAL | 0 refills | Status: DC

## 2017-05-03 NOTE — Telephone Encounter (Signed)
Routing message 

## 2017-05-03 NOTE — Telephone Encounter (Signed)
Will give one time RX. Apparently I gave her for back muscle spasms. In 2017.

## 2017-05-03 NOTE — Addendum Note (Signed)
Addended by: Mahala Menghini on: 05/03/2017 01:51 PM   Modules accepted: Orders

## 2017-05-03 NOTE — Telephone Encounter (Signed)
After reviewing the chart, I wasn't able to find Rx. I called [facility], med was prescribed by AB on 02/02/17.

## 2017-05-03 NOTE — Telephone Encounter (Signed)
Please help! I can find where we wrote for flexeril for her. Not a typical GI RX.

## 2017-05-03 NOTE — Telephone Encounter (Signed)
Noted. Lm, notifying pt rx was sent to pharmacy.

## 2017-05-03 NOTE — Telephone Encounter (Signed)
I am going to decline refilling her flexeril. As per AB last OV note 02/2017, patient was not taking the Flexeril.

## 2017-05-03 NOTE — Telephone Encounter (Signed)
Noted. I sent a staff message. It was also prescribed 11/01/15 by LSL.

## 2017-05-04 ENCOUNTER — Telehealth: Payer: Self-pay | Admitting: Internal Medicine

## 2017-05-04 NOTE — Telephone Encounter (Signed)
Per phone call--pt stated she thinks she's going into Afib again, she's feeling SOB and having a hard time getting around. Please give pt a call @ [phone]

## 2017-05-04 NOTE — Telephone Encounter (Signed)
Returned pt. Call, no answer. Left message for pt to return call.

## 2017-05-09 ENCOUNTER — Observation Stay (HOSPITAL_BASED_OUTPATIENT_CLINIC_OR_DEPARTMENT_OTHER): Payer: PPO

## 2017-05-09 ENCOUNTER — Encounter (HOSPITAL_COMMUNITY): Payer: Self-pay | Admitting: Emergency Medicine

## 2017-05-09 ENCOUNTER — Emergency Department (HOSPITAL_COMMUNITY): Payer: PPO

## 2017-05-09 ENCOUNTER — Inpatient Hospital Stay (HOSPITAL_COMMUNITY)
Admission: EM | Admit: 2017-05-09 | Discharge: 2017-05-17 | DRG: 286 | Disposition: A | Discharge status: Home / Self Care | Payer: PPO | Attending: Internal Medicine | Admitting: Internal Medicine

## 2017-05-09 DIAGNOSIS — E890 Postprocedural hypothyroidism: Secondary | ICD-10-CM | POA: Diagnosis not present

## 2017-05-09 DIAGNOSIS — E1142 Type 2 diabetes mellitus with diabetic polyneuropathy: Secondary | ICD-10-CM | POA: Diagnosis not present

## 2017-05-09 DIAGNOSIS — I959 Hypotension, unspecified: Secondary | ICD-10-CM | POA: Diagnosis present

## 2017-05-09 DIAGNOSIS — I10 Essential (primary) hypertension: Secondary | ICD-10-CM

## 2017-05-09 DIAGNOSIS — I081 Rheumatic disorders of both mitral and tricuspid valves: Secondary | ICD-10-CM | POA: Diagnosis not present

## 2017-05-09 DIAGNOSIS — N183 Chronic kidney disease, stage 3 unspecified: Secondary | ICD-10-CM | POA: Diagnosis present

## 2017-05-09 DIAGNOSIS — R778 Other specified abnormalities of plasma proteins: Secondary | ICD-10-CM

## 2017-05-09 DIAGNOSIS — I83893 Varicose veins of bilateral lower extremities with other complications: Secondary | ICD-10-CM | POA: Diagnosis not present

## 2017-05-09 DIAGNOSIS — R0602 Shortness of breath: Secondary | ICD-10-CM | POA: Diagnosis not present

## 2017-05-09 DIAGNOSIS — E871 Hypo-osmolality and hyponatremia: Secondary | ICD-10-CM | POA: Diagnosis present

## 2017-05-09 DIAGNOSIS — R0609 Other forms of dyspnea: Secondary | ICD-10-CM | POA: Diagnosis not present

## 2017-05-09 DIAGNOSIS — R7989 Other specified abnormal findings of blood chemistry: Secondary | ICD-10-CM | POA: Diagnosis not present

## 2017-05-09 DIAGNOSIS — E785 Hyperlipidemia, unspecified: Secondary | ICD-10-CM | POA: Diagnosis present

## 2017-05-09 DIAGNOSIS — G4733 Obstructive sleep apnea (adult) (pediatric): Secondary | ICD-10-CM | POA: Diagnosis not present

## 2017-05-09 DIAGNOSIS — I5033 Acute on chronic diastolic (congestive) heart failure: Secondary | ICD-10-CM

## 2017-05-09 DIAGNOSIS — I481 Persistent atrial fibrillation: Secondary | ICD-10-CM | POA: Diagnosis present

## 2017-05-09 DIAGNOSIS — I428 Other cardiomyopathies: Secondary | ICD-10-CM

## 2017-05-09 DIAGNOSIS — I509 Heart failure, unspecified: Secondary | ICD-10-CM | POA: Diagnosis not present

## 2017-05-09 DIAGNOSIS — Z79890 Hormone replacement therapy (postmenopausal): Secondary | ICD-10-CM

## 2017-05-09 DIAGNOSIS — Z888 Allergy status to other drugs, medicaments and biological substances status: Secondary | ICD-10-CM

## 2017-05-09 DIAGNOSIS — R748 Abnormal levels of other serum enzymes: Secondary | ICD-10-CM | POA: Diagnosis present

## 2017-05-09 DIAGNOSIS — Z885 Allergy status to narcotic agent status: Secondary | ICD-10-CM

## 2017-05-09 DIAGNOSIS — R0601 Orthopnea: Secondary | ICD-10-CM

## 2017-05-09 DIAGNOSIS — Z801 Family history of malignant neoplasm of trachea, bronchus and lung: Secondary | ICD-10-CM

## 2017-05-09 DIAGNOSIS — I519 Heart disease, unspecified: Secondary | ICD-10-CM | POA: Diagnosis not present

## 2017-05-09 DIAGNOSIS — I130 Hypertensive heart and chronic kidney disease with heart failure and stage 1 through stage 4 chronic kidney disease, or unspecified chronic kidney disease: Secondary | ICD-10-CM | POA: Diagnosis not present

## 2017-05-09 DIAGNOSIS — Z8249 Family history of ischemic heart disease and other diseases of the circulatory system: Secondary | ICD-10-CM

## 2017-05-09 DIAGNOSIS — O2230 Deep phlebothrombosis in pregnancy, unspecified trimester: Secondary | ICD-10-CM

## 2017-05-09 DIAGNOSIS — E1122 Type 2 diabetes mellitus with diabetic chronic kidney disease: Secondary | ICD-10-CM | POA: Diagnosis not present

## 2017-05-09 DIAGNOSIS — E1121 Type 2 diabetes mellitus with diabetic nephropathy: Secondary | ICD-10-CM | POA: Diagnosis not present

## 2017-05-09 DIAGNOSIS — J849 Interstitial pulmonary disease, unspecified: Secondary | ICD-10-CM | POA: Diagnosis not present

## 2017-05-09 DIAGNOSIS — I214 Non-ST elevation (NSTEMI) myocardial infarction: Secondary | ICD-10-CM | POA: Diagnosis not present

## 2017-05-09 DIAGNOSIS — I2510 Atherosclerotic heart disease of native coronary artery without angina pectoris: Secondary | ICD-10-CM | POA: Diagnosis not present

## 2017-05-09 DIAGNOSIS — E782 Mixed hyperlipidemia: Secondary | ICD-10-CM

## 2017-05-09 DIAGNOSIS — I248 Other forms of acute ischemic heart disease: Secondary | ICD-10-CM

## 2017-05-09 DIAGNOSIS — K219 Gastro-esophageal reflux disease without esophagitis: Secondary | ICD-10-CM | POA: Diagnosis present

## 2017-05-09 DIAGNOSIS — D509 Iron deficiency anemia, unspecified: Secondary | ICD-10-CM | POA: Diagnosis not present

## 2017-05-09 DIAGNOSIS — I110 Hypertensive heart disease with heart failure: Secondary | ICD-10-CM | POA: Diagnosis not present

## 2017-05-09 DIAGNOSIS — I700 Atherosclerosis of aorta: Secondary | ICD-10-CM | POA: Diagnosis not present

## 2017-05-09 DIAGNOSIS — I5043 Acute on chronic combined systolic (congestive) and diastolic (congestive) heart failure: Secondary | ICD-10-CM | POA: Diagnosis not present

## 2017-05-09 DIAGNOSIS — N179 Acute kidney failure, unspecified: Secondary | ICD-10-CM | POA: Diagnosis not present

## 2017-05-09 DIAGNOSIS — R791 Abnormal coagulation profile: Secondary | ICD-10-CM | POA: Diagnosis not present

## 2017-05-09 DIAGNOSIS — I479 Paroxysmal tachycardia, unspecified: Secondary | ICD-10-CM

## 2017-05-09 DIAGNOSIS — M109 Gout, unspecified: Secondary | ICD-10-CM | POA: Diagnosis present

## 2017-05-09 DIAGNOSIS — M545 Low back pain: Secondary | ICD-10-CM | POA: Diagnosis not present

## 2017-05-09 DIAGNOSIS — I472 Ventricular tachycardia: Secondary | ICD-10-CM | POA: Diagnosis present

## 2017-05-09 DIAGNOSIS — Z8774 Personal history of (corrected) congenital malformations of heart and circulatory system: Secondary | ICD-10-CM

## 2017-05-09 DIAGNOSIS — Z8049 Family history of malignant neoplasm of other genital organs: Secondary | ICD-10-CM

## 2017-05-09 DIAGNOSIS — E1151 Type 2 diabetes mellitus with diabetic peripheral angiopathy without gangrene: Secondary | ICD-10-CM | POA: Diagnosis present

## 2017-05-09 DIAGNOSIS — I4891 Unspecified atrial fibrillation: Secondary | ICD-10-CM | POA: Diagnosis present

## 2017-05-09 DIAGNOSIS — R05 Cough: Secondary | ICD-10-CM | POA: Diagnosis not present

## 2017-05-09 DIAGNOSIS — Z7984 Long term (current) use of oral hypoglycemic drugs: Secondary | ICD-10-CM

## 2017-05-09 DIAGNOSIS — E039 Hypothyroidism, unspecified: Secondary | ICD-10-CM

## 2017-05-09 DIAGNOSIS — I4819 Other persistent atrial fibrillation: Secondary | ICD-10-CM | POA: Diagnosis present

## 2017-05-09 DIAGNOSIS — Z880 Allergy status to penicillin: Secondary | ICD-10-CM

## 2017-05-09 DIAGNOSIS — E119 Type 2 diabetes mellitus without complications: Secondary | ICD-10-CM

## 2017-05-09 DIAGNOSIS — E118 Type 2 diabetes mellitus with unspecified complications: Secondary | ICD-10-CM

## 2017-05-09 DIAGNOSIS — I361 Nonrheumatic tricuspid (valve) insufficiency: Secondary | ICD-10-CM

## 2017-05-09 DIAGNOSIS — I5031 Acute diastolic (congestive) heart failure: Secondary | ICD-10-CM

## 2017-05-09 DIAGNOSIS — I471 Supraventricular tachycardia: Secondary | ICD-10-CM | POA: Diagnosis not present

## 2017-05-09 DIAGNOSIS — M419 Scoliosis, unspecified: Secondary | ICD-10-CM | POA: Diagnosis present

## 2017-05-09 DIAGNOSIS — Z79899 Other long term (current) drug therapy: Secondary | ICD-10-CM

## 2017-05-09 DIAGNOSIS — R600 Localized edema: Secondary | ICD-10-CM | POA: Diagnosis not present

## 2017-05-09 DIAGNOSIS — I5021 Acute systolic (congestive) heart failure: Secondary | ICD-10-CM | POA: Diagnosis not present

## 2017-05-09 HISTORY — DX: Poisoning by other antidysrhythmic drugs, accidental (unintentional), initial encounter: T46.2X1A

## 2017-05-09 HISTORY — DX: Other cardiomyopathies: I42.8

## 2017-05-09 HISTORY — DX: Chronic systolic (congestive) heart failure: I50.22

## 2017-05-09 HISTORY — DX: Anemia, unspecified: D64.9

## 2017-05-09 HISTORY — DX: Other persistent atrial fibrillation: I48.19

## 2017-05-09 HISTORY — DX: Atherosclerotic heart disease of native coronary artery without angina pectoris: I25.10

## 2017-05-09 LAB — CBC WITH DIFFERENTIAL/PLATELET
BASOS ABS: 0.1 10*3/uL (ref 0.0–0.1)
Basophils Relative: 1 %
EOS ABS: 0.3 10*3/uL (ref 0.0–0.7)
EOS PCT: 3 %
HCT: 40.9 % (ref 36.0–46.0)
HEMOGLOBIN: 13.1 g/dL (ref 12.0–15.0)
Lymphocytes Relative: 28 %
Lymphs Abs: 2.7 10*3/uL (ref 0.7–4.0)
MCH: 29.6 pg (ref 26.0–34.0)
MCHC: 32 g/dL (ref 30.0–36.0)
MCV: 92.3 fL (ref 78.0–100.0)
Monocytes Absolute: 0.7 10*3/uL (ref 0.1–1.0)
Monocytes Relative: 7 %
NEUTROS PCT: 61 %
Neutro Abs: 6 10*3/uL (ref 1.7–7.7)
PLATELETS: 205 10*3/uL (ref 150–400)
RBC: 4.43 MIL/uL (ref 3.87–5.11)
RDW: 17.2 % — ABNORMAL HIGH (ref 11.5–15.5)
WBC: 9.7 10*3/uL (ref 4.0–10.5)

## 2017-05-09 LAB — ECHOCARDIOGRAM COMPLETE
CHL CUP RV SYS PRESS: 45 mmHg
CHL CUP TV REG PEAK VELOCITY: 323 cm/s
EERAT: 18.39
EWDT: 130 ms
FS: 13 % — AB (ref 28–44)
HEIGHTINCHES: 58 in
IVS/LV PW RATIO, ED: 1.13
LA diam end sys: 44 mm
LA diam index: 2.63 cm/m2
LA vol A4C: 103 ml
LASIZE: 44 mm
LAVOL: 97.4 mL
LAVOLIN: 58.2 mL/m2
LDCA: 2.27 cm2
LV E/e' medial: 18.39
LV dias vol: 87 mL (ref 46–106)
LV sys vol: 48 mL — AB (ref 14–42)
LVDIAVOLIN: 52 mL/m2
LVEEAVG: 18.39
LVELAT: 8.59 cm/s
LVOT SV: 29 mL
LVOT VTI: 12.7 cm
LVOT diameter: 17 mm
LVOT peak grad rest: 2 mmHg
LVOTPV: 77.6 cm/s
LVSYSVOLIN: 28 mL/m2
Lateral S' vel: 8.16 cm/s
MV Dec: 130
MV Peak grad: 10 mmHg
MV VTI: 124 cm
MV pk E vel: 158 m/s
PW: 10.1 mm — AB (ref 0.6–1.1)
Simpson's disk: 45
Stroke v: 39 ml
TAPSE: 15.7 mm
TDI e' lateral: 8.59
TR max vel: 323 cm/s
WEIGHTICAEL: 2336 [oz_av]

## 2017-05-09 LAB — COMPREHENSIVE METABOLIC PANEL
ALBUMIN: 3.7 g/dL (ref 3.5–5.0)
ALK PHOS: 105 U/L (ref 38–126)
ALT: 55 U/L — AB (ref 14–54)
AST: 53 U/L — AB (ref 15–41)
Anion gap: 12 (ref 5–15)
BUN: 33 mg/dL — AB (ref 6–20)
CHLORIDE: 100 mmol/L — AB (ref 101–111)
CO2: 21 mmol/L — AB (ref 22–32)
CREATININE: 1.68 mg/dL — AB (ref 0.44–1.00)
Calcium: 9.1 mg/dL (ref 8.9–10.3)
GFR calc Af Amer: 37 mL/min — ABNORMAL LOW (ref >60)
GFR calc non Af Amer: 32 mL/min — ABNORMAL LOW (ref >60)
GLUCOSE: 194 mg/dL — AB (ref 65–99)
Potassium: 3.5 mmol/L (ref 3.5–5.1)
SODIUM: 133 mmol/L — AB (ref 135–145)
Total Bilirubin: 0.8 mg/dL (ref 0.3–1.2)
Total Protein: 7.1 g/dL (ref 6.5–8.1)

## 2017-05-09 LAB — TSH: TSH: 1.676 u[IU]/mL (ref 0.350–4.500)

## 2017-05-09 LAB — HEPARIN LEVEL (UNFRACTIONATED): HEPARIN UNFRACTIONATED: 0.13 [IU]/mL — AB (ref 0.30–0.70)

## 2017-05-09 LAB — APTT: APTT: 31 s (ref 24–36)

## 2017-05-09 LAB — BRAIN NATRIURETIC PEPTIDE: B NATRIURETIC PEPTIDE 5: 826 pg/mL — AB (ref 0.0–100.0)

## 2017-05-09 LAB — GLUCOSE, CAPILLARY
GLUCOSE-CAPILLARY: 91 mg/dL (ref 65–99)
GLUCOSE-CAPILLARY: 105 mg/dL — AB (ref 65–99)

## 2017-05-09 LAB — TROPONIN I
Troponin I: 0.42 ng/mL (ref <0.03)
Troponin I: 0.37 ng/mL (ref <0.03)
Troponin I: 0.35 ng/mL (ref <0.03)

## 2017-05-09 LAB — PROTIME-INR
INR: 1.2
Prothrombin Time: 15.1 seconds (ref 11.4–15.2)

## 2017-05-09 LAB — D-DIMER, QUANTITATIVE (NOT AT ARMC): D DIMER QUANT: 2.26 ug{FEU}/mL — AB (ref 0.00–0.50)

## 2017-05-09 LAB — MAGNESIUM: Magnesium: 1.7 mg/dL (ref 1.7–2.4)

## 2017-05-09 MED ORDER — ALBUTEROL SULFATE (2.5 MG/3ML) 0.083% IN NEBU: 2.5000 mg | INHALATION_SOLUTION | Freq: Four times a day (QID) | RESPIRATORY_TRACT | Status: DC | PRN

## 2017-05-09 MED ORDER — DILTIAZEM HCL-DEXTROSE 100-5 MG/100ML-% IV SOLN (PREMIX)
5.0000 mg/h | INTRAVENOUS | Status: DC
  Administered 2017-05-09: 5 mg/h via INTRAVENOUS
  Filled 2017-05-09: qty 100

## 2017-05-09 MED ORDER — ATORVASTATIN CALCIUM 40 MG PO TABS: 40.0000 mg | ORAL_TABLET | Freq: Every day | ORAL | Status: DC

## 2017-05-09 MED ORDER — INSULIN ASPART 100 UNIT/ML ~~LOC~~ SOLN: 0.0000 [IU] | Freq: Every day | SUBCUTANEOUS | Status: DC

## 2017-05-09 MED ORDER — FUROSEMIDE 10 MG/ML IJ SOLN
40.0000 mg | Freq: Once | INTRAMUSCULAR | Status: AC
  Administered 2017-05-09: 40 mg via INTRAVENOUS
  Filled 2017-05-09: qty 4

## 2017-05-09 MED ORDER — CALCIUM CARBONATE 1250 (500 CA) MG PO TABS
1250.0000 mg | ORAL_TABLET | Freq: Two times a day (BID) | ORAL | Status: DC
  Administered 2017-05-09 – 2017-05-17 (×15): 1250 mg via ORAL
  Filled 2017-05-09 (×15): qty 1

## 2017-05-09 MED ORDER — MAGNESIUM SULFATE 2 GM/50ML IV SOLN
2.0000 g | Freq: Once | INTRAVENOUS | Status: AC
  Administered 2017-05-09: 2 g via INTRAVENOUS
  Filled 2017-05-09: qty 50

## 2017-05-09 MED ORDER — HEPARIN BOLUS VIA INFUSION
3000.0000 [IU] | Freq: Once | INTRAVENOUS | Status: AC
  Administered 2017-05-09: 3000 [IU] via INTRAVENOUS

## 2017-05-09 MED ORDER — SODIUM CHLORIDE 0.9 % IV SOLN: 250.0000 mL | INTRAVENOUS | Status: DC | PRN

## 2017-05-09 MED ORDER — MAGNESIUM OXIDE 400 (241.3 MG) MG PO TABS
400.0000 mg | ORAL_TABLET | Freq: Every day | ORAL | Status: DC
  Administered 2017-05-09 – 2017-05-17 (×9): 400 mg via ORAL
  Filled 2017-05-09 (×9): qty 1

## 2017-05-09 MED ORDER — POTASSIUM CHLORIDE CRYS ER 20 MEQ PO TBCR
40.0000 meq | EXTENDED_RELEASE_TABLET | ORAL | Status: AC
  Administered 2017-05-09 (×2): 40 meq via ORAL
  Filled 2017-05-09 (×2): qty 2

## 2017-05-09 MED ORDER — SODIUM CHLORIDE 0.9% FLUSH: 3.0000 mL | INTRAVENOUS | Status: DC | PRN

## 2017-05-09 MED ORDER — LEVOTHYROXINE SODIUM 25 MCG PO TABS
125.0000 ug | ORAL_TABLET | Freq: Every day | ORAL | Status: DC
  Administered 2017-05-10 – 2017-05-17 (×7): 125 ug via ORAL
  Filled 2017-05-09 (×7): qty 1

## 2017-05-09 MED ORDER — VITAMIN D 1000 UNITS PO TABS
1000.0000 [IU] | ORAL_TABLET | Freq: Every day | ORAL | Status: DC
  Administered 2017-05-10 – 2017-05-17 (×8): 1000 [IU] via ORAL
  Filled 2017-05-09 (×8): qty 1

## 2017-05-09 MED ORDER — HEPARIN (PORCINE) IN NACL 100-0.45 UNIT/ML-% IJ SOLN
1100.0000 [IU]/h | INTRAMUSCULAR | Status: DC
  Administered 2017-05-09: 650 [IU]/h via INTRAVENOUS
  Administered 2017-05-10 – 2017-05-13 (×3): 1000 [IU]/h via INTRAVENOUS
  Administered 2017-05-14: 950 [IU]/h via INTRAVENOUS
  Filled 2017-05-09 (×6): qty 250

## 2017-05-09 MED ORDER — FUROSEMIDE 10 MG/ML IJ SOLN
40.0000 mg | Freq: Two times a day (BID) | INTRAMUSCULAR | Status: DC
  Administered 2017-05-09 – 2017-05-11 (×4): 40 mg via INTRAVENOUS
  Filled 2017-05-09 (×4): qty 4

## 2017-05-09 MED ORDER — PANTOPRAZOLE SODIUM 40 MG PO TBEC
40.0000 mg | DELAYED_RELEASE_TABLET | Freq: Two times a day (BID) | ORAL | Status: DC
  Administered 2017-05-09 – 2017-05-17 (×17): 40 mg via ORAL
  Filled 2017-05-09 (×17): qty 1

## 2017-05-09 MED ORDER — GABAPENTIN 100 MG PO CAPS
100.0000 mg | ORAL_CAPSULE | Freq: Three times a day (TID) | ORAL | Status: DC
  Administered 2017-05-09 – 2017-05-17 (×23): 100 mg via ORAL
  Filled 2017-05-09 (×23): qty 1

## 2017-05-09 MED ORDER — INSULIN ASPART 100 UNIT/ML ~~LOC~~ SOLN
0.0000 [IU] | Freq: Three times a day (TID) | SUBCUTANEOUS | Status: DC
  Administered 2017-05-10 – 2017-05-13 (×5): 1 [IU] via SUBCUTANEOUS
  Administered 2017-05-14: 2 [IU] via SUBCUTANEOUS
  Administered 2017-05-15: 5 [IU] via SUBCUTANEOUS
  Administered 2017-05-15: 1 [IU] via SUBCUTANEOUS
  Administered 2017-05-16 – 2017-05-17 (×2): 2 [IU] via SUBCUTANEOUS

## 2017-05-09 MED ORDER — ALBUTEROL SULFATE HFA 108 (90 BASE) MCG/ACT IN AERS: 2.0000 | INHALATION_SPRAY | Freq: Four times a day (QID) | RESPIRATORY_TRACT | Status: DC | PRN

## 2017-05-09 MED ORDER — FERROUS SULFATE 325 (65 FE) MG PO TABS
325.0000 mg | ORAL_TABLET | Freq: Every day | ORAL | Status: DC
  Administered 2017-05-10 – 2017-05-17 (×8): 325 mg via ORAL
  Filled 2017-05-09 (×8): qty 1

## 2017-05-09 MED ORDER — METOPROLOL SUCCINATE ER 25 MG PO TB24
25.0000 mg | ORAL_TABLET | Freq: Every day | ORAL | Status: DC
  Administered 2017-05-09: 25 mg via ORAL
  Filled 2017-05-09 (×2): qty 1

## 2017-05-09 MED ORDER — SODIUM CHLORIDE 0.9% FLUSH
3.0000 mL | Freq: Two times a day (BID) | INTRAVENOUS | Status: DC
  Administered 2017-05-09 – 2017-05-15 (×12): 3 mL via INTRAVENOUS

## 2017-05-09 MED ORDER — PRAVASTATIN SODIUM 10 MG PO TABS: 10.0000 mg | ORAL_TABLET | Freq: Every day | ORAL | Status: DC

## 2017-05-09 MED ORDER — PRAVASTATIN SODIUM 10 MG PO TABS
20.0000 mg | ORAL_TABLET | Freq: Every day | ORAL | Status: DC
  Filled 2017-05-09: qty 2

## 2017-05-09 MED ORDER — DILTIAZEM HCL 25 MG/5ML IV SOLN
10.0000 mg | Freq: Once | INTRAVENOUS | Status: AC
  Administered 2017-05-09: 10 mg via INTRAVENOUS
  Filled 2017-05-09: qty 5

## 2017-05-09 MED ORDER — ALLOPURINOL 100 MG PO TABS
100.0000 mg | ORAL_TABLET | Freq: Two times a day (BID) | ORAL | Status: DC
  Administered 2017-05-09 – 2017-05-17 (×15): 100 mg via ORAL
  Filled 2017-05-09 (×15): qty 1

## 2017-05-09 MED ORDER — TEMAZEPAM 15 MG PO CAPS
15.0000 mg | ORAL_CAPSULE | Freq: Every evening | ORAL | Status: DC | PRN
  Administered 2017-05-10 – 2017-05-14 (×4): 15 mg via ORAL
  Filled 2017-05-09 (×4): qty 1

## 2017-05-09 NOTE — Progress Notes (Signed)
ANTICOAGULATION CONSULT NOTE - Initial Consult  Pharmacy Consult for Heparin Indication: chest pain/ACS  Allergies  Allergen Reactions  . Flecainide Nausea Only and Other (See Comments)    Faint feeling  . Hydrocodone-Acetaminophen Nausea Only and Other (See Comments)    Severe headache  . Ibuprofen Other (See Comments)    Kidney dysfunction  . Oxycodone Hcl Nausea Only and Other (See Comments)    Headache  . Penicillins Nausea Only and Other (See Comments)    Severe headache Has patient had a PCN reaction causing immediate rash, facial/tongue/throat swelling, SOB or lightheadedness with hypotension: No Has patient had a PCN reaction causing severe rash involving mucus membranes or skin necrosis: No Has patient had a PCN reaction that required hospitalization: No Has patient had a PCN reaction occurring within the last 10 years: No If all of the above answers are "NO", then may proceed with Cephalosporin use.     Patient Measurements: Height: 4\' 10"  (147.3 cm) Weight: 146 lb (66.2 kg) IBW/kg (Calculated) : 40.9 HEPARIN DW (KG): 55.7   Vital Signs: Temp: 97.8 F (36.6 C) (11/14 1012) Temp Source: Oral (11/14 1012) BP: 107/81 (11/14 1200) Pulse Rate: 103 (11/14 1105)  Labs: Recent Labs    05/09/17 1010  HGB 13.1  HCT 40.9  PLT 205  CREATININE 1.68*  TROPONINI 0.42*    Estimated Creatinine Clearance: 28.3 mL/min (A) (by C-G formula based on SCr of 1.68 mg/dL (H)).   Medical History: Past Medical History:  Diagnosis Date  . Amiodarone toxicity   . Anemia   . Atrial fibrillation ([place]) Jan. 2015  . CHF (congestive heart failure) ([place])   . Chronic bronchitis   . Congenital heart disease   . Diabetes mellitus without complication ([place])    Type II  . Diverticulosis   . Fall due to stumbling October 08, 2013   Due to shoes  . GERD (gastroesophageal reflux disease)   . Gout   . Hemorrhoids   . Hyperlipidemia   . Hyperparathyroidism   . Hypertension   .  Hypothyroidism   . IDA (iron deficiency anemia)   . Microscopic colitis 9/11   Colonoscopy  . Paroxysmal SVT (supraventricular tachycardia) ([place])   . PVD (peripheral vascular disease) ([place]) 09/2004   Right common femoral endarterectomy in April of 2007  . Renal insufficiency   . Schatzki's ring    Last EGD with esophageal dilatation 24F  9/11  . Scoliosis   . Varicose veins right leg pain and swelling   Medications:   (Not in a hospital admission)  Assessment: Okay for Protocol, baseline anticoag labs this admission pending.  Elevated Troponin.   Goal of Therapy:  Heparin level 0.3-0.7 units/ml Monitor platelets by anticoagulation protocol: Yes   Plan:  Give 3000 units bolus x 1 Start heparin infusion at 650 units/hr Check anti-Xa level in 6-8 hours and daily while on heparin Continue to monitor H&H and platelets  Pricilla Larsson 05/09/2017,12:13 PM

## 2017-05-09 NOTE — Progress Notes (Signed)
*  PRELIMINARY RESULTS* Echocardiogram 2D Echocardiogram has been performed.  [name] 05/09/2017, 1:29 PM

## 2017-05-09 NOTE — Progress Notes (Signed)
[facility] for Heparin Indication: chest pain/ACS  Allergies  Allergen Reactions  . Flecainide Nausea Only and Other (See Comments)    Faint feeling  . Hydrocodone-Acetaminophen Nausea Only and Other (See Comments)    Severe headache  . Ibuprofen Other (See Comments)    Kidney dysfunction  . Oxycodone Hcl Nausea Only and Other (See Comments)    Headache  . Penicillins Nausea Only and Other (See Comments)    Severe headache Has patient had a PCN reaction causing immediate rash, facial/tongue/throat swelling, SOB or lightheadedness with hypotension: No Has patient had a PCN reaction causing severe rash involving mucus membranes or skin necrosis: No Has patient had a PCN reaction that required hospitalization: No Has patient had a PCN reaction occurring within the last 10 years: No If all of the above answers are "NO", then may proceed with Cephalosporin use.     Patient Measurements: Height: 4\' 10"  (147.3 cm) Weight: 149 lb 4 oz (67.7 kg) IBW/kg (Calculated) : 40.9 HEPARIN DW (KG): 56.1   Vital Signs: Temp: 97.8 F (36.6 C) (11/14 2000) Temp Source: Oral (11/14 2000) BP: 98/62 (11/14 2100) Pulse Rate: 92 (11/14 2100)  Labs: Recent Labs    05/09/17 1010 05/09/17 1011 05/09/17 1511 05/09/17 2104  HGB 13.1  --   --   --   HCT 40.9  --   --   --   PLT 205  --   --   --   APTT  --  31  --   --   LABPROT  --  15.1  --   --   INR  --  1.20  --   --   HEPARINUNFRC  --   --   --  0.13*  CREATININE 1.68*  --   --   --   TROPONINI 0.42*  --  0.37*  --     Estimated Creatinine Clearance: 28.6 mL/min (A) (by C-G formula based on SCr of 1.68 mg/dL (H)).   Medical History: Past Medical History:  Diagnosis Date  . Amiodarone toxicity   . Anemia   . Atrial fibrillation ([place]) Jan. 2015  . CHF (congestive heart failure) ([place])   . Chronic bronchitis   . Congenital heart disease   . Diabetes mellitus without complication ([place])    Type  II  . Diverticulosis   . Fall due to stumbling October 08, 2013   Due to shoes  . GERD (gastroesophageal reflux disease)   . Gout   . Hemorrhoids   . Hyperlipidemia   . Hyperparathyroidism   . Hypertension   . Hypothyroidism   . IDA (iron deficiency anemia)   . Microscopic colitis 9/11   Colonoscopy  . Paroxysmal SVT (supraventricular tachycardia) ([place])   . PVD (peripheral vascular disease) ([place]) 09/2004   Right common femoral endarterectomy in April of 2007  . Renal insufficiency   . Schatzki's ring    Last EGD with esophageal dilatation 58F  9/11  . Scoliosis   . Varicose veins right leg pain and swelling   Medications:  Medications Prior to Admission  Medication Sig Dispense Refill Last Dose  . albuterol (PROVENTIL HFA;VENTOLIN HFA) 108 (90 Base) MCG/ACT inhaler Inhale 2 puffs into the lungs every 6 (six) hours as needed for wheezing or shortness of breath. 1 Inhaler 3 05/09/2017 at Unknown time  . allopurinol (ZYLOPRIM) 100 MG tablet Take 100 mg by mouth 2 (two) times daily.    05/08/2017 at Unknown time  .  calcium carbonate (OS-CAL) 600 MG TABS Take 600 mg by mouth 2 (two) times daily with a meal.     05/08/2017 at Unknown time  . cholecalciferol (VITAMIN D) 1000 UNITS tablet Take 1,000 Units by mouth daily.   05/08/2017 at Unknown time  . cyclobenzaprine (FLEXERIL) 5 MG tablet Take 1-2 tabs by mouth up to three times daily for muscle spasms. 30 tablet 0 05/08/2017 at Unknown time  . ferrous sulfate 325 (65 FE) MG tablet Take 325 mg by mouth daily with breakfast.   05/08/2017 at Unknown time  . furosemide (LASIX) 20 MG tablet TAKE ONE TABLET BY MOUTH DAILY AS NEEDED. (Patient taking differently: TAKE ONE TABLET BY MOUTH DAILY AS NEEDED FOR FLUID RETENTION (~EVERY OTHER DAY)) 30 tablet 3 05/08/2017 at Unknown time  . gabapentin (NEURONTIN) 100 MG capsule Take 1 capsule 3 (three) times daily by mouth.   05/08/2017 at Unknown time  . GLIPIZIDE XL 5 MG 24 hr tablet Take 5 mg by mouth  daily before breakfast.   05/08/2017 at Unknown time  . levothyroxine (SYNTHROID, LEVOTHROID) 125 MCG tablet Take 125 mcg by mouth daily before breakfast.   05/08/2017 at Unknown time  . loperamide (IMODIUM) 2 MG capsule Take 2-4 mg by mouth 4 (four) times daily as needed for diarrhea or loose stools.   Taking  . losartan (COZAAR) 50 MG tablet Take 50 mg by mouth daily.    05/08/2017 at Unknown time  . Magnesium 250 MG TABS Take 250 mg by mouth daily.    05/08/2017 at Unknown time  . metoprolol succinate (TOPROL XL) 25 MG 24 hr tablet Take 1 tablet (25 mg total) by mouth daily. 90 tablet 3 05/08/2017 at 800  . Omega-3 Fatty Acids (FISH OIL) 1200 MG CAPS Take 1,200 mg by mouth 2 (two) times daily.   05/08/2017 at Unknown time  . pantoprazole (PROTONIX) 40 MG tablet Take 40 mg by mouth 2 (two) times daily.    05/08/2017 at Unknown time  . polyethylene glycol (MIRALAX / GLYCOLAX) packet Take 17 g by mouth daily as needed (for constipation.).    05/08/2017 at Unknown time  . potassium chloride (K-DUR) 10 MEQ tablet Take 10 mEq by mouth daily as needed. WITH LASIX (FLUID RETENTION) ~EVERY OTHER DAY   05/08/2017 at Unknown time  . pravastatin (PRAVACHOL) 10 MG tablet Take 10 mg by mouth daily.   05/08/2017 at Unknown time  . Probiotic Product (ALIGN PO) Take 1 tablet by mouth daily.    05/08/2017 at Unknown time  . sitaGLIPtin (JANUVIA) 50 MG tablet Take 50 mg by mouth daily.   05/08/2017 at Unknown time  . temazepam (RESTORIL) 15 MG capsule Take 1 capsule at bedtime by mouth.   05/08/2017 at Unknown time  . acetaminophen (TYLENOL) 500 MG tablet Take 1,000 mg by mouth every 6 (six) hours as needed (for pain.).    Taking    Assessment: 61 yo lady on heparin for CP.  Initial heparin level is low   Goal of Therapy:  Heparin level 0.3-0.7 units/ml Monitor platelets by anticoagulation protocol: Yes   Plan:  Increase heparin drip to 850 units/hr F/u am labs Monitor for bleeding complications  Charyl Minervini,  Kenia Teagarden [place] 05/09/2017,10:33 PM

## 2017-05-09 NOTE — ED Triage Notes (Signed)
PT c/o SOB with exertion x2 weeks and states history of rapid afib. PT denies any chest pain.

## 2017-05-09 NOTE — Consult Note (Signed)
Cardiology Consultation:   Patient ID: RYDER CHESMORE; [id number]; Nov 01, 1955   Admit date: 05/09/2017 Date of Consult: 05/09/2017  Primary Care Provider: Celene Squibb, MD Primary Cardiologist: Dr. Lovena Le Primary Electrophysiologist:  Dr. Lovena Le   Patient Profile:   ARIHANNA ESTABROOK is a 61 y.o. female with a hx of persistent atrial fibrillation, amiodarone induced pulmonary toxicity, and chronic diastolic heart failure who is being seen today for the evaluation of progressive exertional dyspnea at the request of Dr. Thurnell Garbe.  History of Present Illness:   Ms. Kessinger is a 61 year old woman with a hx of persistent atrial fibrillation, amiodarone induced pulmonary toxicity, and chronic diastolic heart failure.  She last saw Dr. Lovena Le on 03/16/17.  It was noted that since amiodarone was discontinued, her dyspnea improved as did her cough.  Dofetilide was discussed and this was going to be revisited at her next appointment.  He also talked about AV nodal ablation and pacemaker.  She refused anticoagulation for atrial fibrillation. It was also noted that she has a history of sodium indiscretion.  She also has a history of normocytic anemia in a mixed pattern in the setting of renal insufficiency and iron deficiency.  She also follows with GI.  For the past 2 weeks, she has noticed progressive exertional dyspnea to the point where she is unable to walk 5-10 feet without having to stop.  She has noticed progressive bilateral leg swelling as well.  She denies exertional chest pain and tightness.  2 or 3 nights ago she tried sleeping on 2 pillows but was unable to do so.  Chest x-ray today shows chronic interstitial changes bilaterally and pulmonary vascular congestion with a small right pleural effusion and thoracic aortic atherosclerosis.  Pertinent labs today: Normal hemoglobin of 13.1, normal white blood cells 9.7, mild hyponatremia sodium 133, BUN 33, creatinine 1.68. BNP elevated to 826,  troponins elevated to 0.42.  Nuclear stress test on 07/25/13 it was overall low risk with a small area of ischemia in the distal lateral wall/apex.  Echocardiogram 12/18/16: Normal left ventricular systolic function with mild LVH, LVEF 50-55%, poor endocardial visualization with LV non-compaction not excluded, grade 2 diastolic dysfunction with elevated filling pressures, trivial aortic and mild mitral regurgitation.  There was severe left atrial and moderate right atrial dilatation with mild right ventricular dilatation and moderately elevated pulmonary pressures, 52 mmHg.  ECG performed today which I personally interpreted demonstrated sinus tachycardia with RSR prime pattern in V1.  An atrial tachycardia is also in the differential diagnosis.   Past Medical History:  Diagnosis Date  . Amiodarone toxicity   . Anemia   . Atrial fibrillation ([place]) Jan. 2015  . CHF (congestive heart failure) ([place])   . Chronic bronchitis   . Congenital heart disease   . Diabetes mellitus without complication ([place])    Type II  . Diverticulosis   . Fall due to stumbling October 08, 2013   Due to shoes  . GERD (gastroesophageal reflux disease)   . Gout   . Hemorrhoids   . Hyperlipidemia   . Hyperparathyroidism   . Hypertension   . Hypothyroidism   . IDA (iron deficiency anemia)   . Microscopic colitis 9/11   Colonoscopy  . Paroxysmal SVT (supraventricular tachycardia) ([place])   . PVD (peripheral vascular disease) ([place]) 09/2004   Right common femoral endarterectomy in April of 2007  . Renal insufficiency   . Schatzki's ring    Last EGD with esophageal dilatation 50F  9/11  .  Scoliosis   . Varicose veins right leg pain and swelling    Past Surgical History:  Procedure Laterality Date  . ASD REPAIR  Age 40   [facility]  . COLONOSCOPY  03/04/2010   anal canal hemorrhoids otherwise normal. TI normal. Bx showed lymphocytic colitis. next TCS 02/2020  . COLONOSCOPY  April  2008   [facility]: Pancolonic diverticulosis, external hemorrhoids  . ELAS  06-01-11   Right saphenous ELAS   . ESOPHAGOGASTRODUODENOSCOPY  03/04/2010   noncritical appearing Schatzki ring. SB bx negative  . ESOPHAGOGASTRODUODENOSCOPY  05/2008   erosive reflux esophagatitis, noncritical Schatzki ring  . [facility]ish Vaya  2010  . Left hemithyroidectomy    . Left parathyroidectomy  2007  . Parathyroid adenoma  2007  . Right common femoral endarterectomy  2007  . Small bowel capsule  12 2009   Mid to distal small bowel with edema, erosions, tiny ulceration felt to be NSAID related  . TONSILLECTOMY         Inpatient Medications: Scheduled Meds: . furosemide  40 mg Intravenous Once   Continuous Infusions:  PRN Meds:   Allergies:    Allergies  Allergen Reactions  . Flecainide Nausea Only and Other (See Comments)    Faint feeling  . Hydrocodone-Acetaminophen Nausea Only and Other (See Comments)    Severe headache  . Ibuprofen Other (See Comments)    Kidney dysfunction  . Oxycodone Hcl Nausea Only and Other (See Comments)    Headache  . Penicillins Nausea Only and Other (See Comments)    Severe headache Has patient had a PCN reaction causing immediate rash, facial/tongue/throat swelling, SOB or lightheadedness with hypotension: No Has patient had a PCN reaction causing severe rash involving mucus membranes or skin necrosis: No Has patient had a PCN reaction that required hospitalization: No Has patient had a PCN reaction occurring within the last 10 years: No If all of the above answers are "NO", then may proceed with Cephalosporin use.     Social History:   Social History   Socioeconomic History  . Marital status: Single    Spouse name: Not on file  . Number of children: 0  . Years of education: Not on file  . Highest education level: Not on file  Social Needs  . Financial resource strain: Not on file  . Food insecurity - worry: Not on file  . Food insecurity -  inability: Not on file  . Transportation needs - medical: Not on file  . Transportation needs - non-medical: Not on file  Occupational History  . Occupation: Sales promotion account executive  Tobacco Use  . Smoking status: Never Smoker  . Smokeless tobacco: Never Used  . Tobacco comment: Never smoked  Substance and Sexual Activity  . Alcohol use: No    Alcohol/week: 0.0 oz  . Drug use: No  . Sexual activity: No    Birth control/protection: Post-menopausal  Other Topics Concern  . Not on file  Social History Narrative  . Not on file    Family History:    Family History  Problem Relation Age of Onset  . Stroke Mother   . Heart disease Mother        CHF  - Amputation-Right Leg  . Hyperlipidemia Mother   . Hypertension Mother   . Congestive Heart Failure Mother   . Lung cancer Father   . Cancer Father        Lung  . Other Brother  back problems  . Heart attack Paternal Grandmother   . Cancer Maternal Grandmother        uterine     ROS:  Please see the history of present illness.  ROS  All other ROS reviewed and negative.     Physical Exam/Data:   Vitals:   05/09/17 1010 05/09/17 1011 05/09/17 1012 05/09/17 1105  BP:  113/89  101/74  Pulse:  (!) 102  (!) 103  Resp:  (!) 22  (!) 23  Temp:   97.8 F (36.6 C)   TempSrc:   Oral   SpO2:  96%  98%  Weight: 146 lb (66.2 kg)     Height: 4\' 10"  (1.473 m)      No intake or output data in the 24 hours ending 05/09/17 1147 Filed Weights   05/09/17 1010  Weight: 146 lb (66.2 kg)   Body mass index is 30.51 kg/m.  General:  Well nourished, well developed, in no acute distress HEENT: normal Lymph: no adenopathy Neck: no JVD Endocrine:  No thryomegaly Cardiac:  Tachycardic, regular, soft 1/6 systolic murmur along left sternal border Lungs: Diminished throughout.  Bibasilar crackles.  No wheezes. Abd: soft, nontender, no hepatomegaly  Ext: Bilateral 1+ pitting lower extremity edema. Musculoskeletal:  No deformities Skin: warm  and dry  Neuro:  No focal abnormalities noted Psych:  Normal affect    Relevant CV Studies: See above.  Laboratory Data:  Chemistry Recent Labs  Lab 05/09/17 1010  NA 133*  K 3.5  CL 100*  CO2 21*  GLUCOSE 194*  BUN 33*  CREATININE 1.68*  CALCIUM 9.1  GFRNONAA 32*  GFRAA 37*  ANIONGAP 12    Recent Labs  Lab 05/09/17 1010  PROT 7.1  ALBUMIN 3.7  AST 53*  ALT 55*  ALKPHOS 105  BILITOT 0.8   Hematology Recent Labs  Lab 05/09/17 1010  WBC 9.7  RBC 4.43  HGB 13.1  HCT 40.9  MCV 92.3  MCH 29.6  MCHC 32.0  RDW 17.2*  PLT 205   Cardiac Enzymes Recent Labs  Lab 05/09/17 1010  TROPONINI 0.42*   No results for input(s): TROPIPOC in the last 168 hours.  BNP Recent Labs  Lab 05/09/17 1010  BNP 826.0*    DDimer No results for input(s): DDIMER in the last 168 hours.  Radiology/Studies:  Dg Chest 2 View  Result Date: 05/09/2017 CLINICAL DATA:  Cough and chest congestion with shortness of breath over the past 2 days. History of congenital heart disease, chronic bronchitis, diabetes, never smoked. EXAM: CHEST  2 VIEW COMPARISON:  Chest x-ray of February 23, 2017 and chest CT scan of March 08, 2017. FINDINGS: The lungs are slightly less well inflated today especially on the right. The interstitial markings are coarse and more conspicuous today. The cardiac silhouette is enlarged. The pulmonary vascularity is engorged similar to that seen previously. There is calcification in the wall of the aortic arch and tortuosity of the descending thoracic aorta. There surgical clips in the upper paratracheal region. There is a small right pleural effusion. IMPRESSION: Chronic interstitial changes bilaterally and pulmonary vascular congestion accentuated by mild hypoinflation. Stable enlargement of cardiac silhouette. Small right pleural effusion, stable. Thoracic aortic atherosclerosis. Electronically Signed   By: David  Martinique M.D.   On: 05/09/2017 10:36    Assessment and  Plan:   1.  Progressive shortness of breath with orthopnea/acute on chronic diastolic heart failure: I will start IV Lasix 40 mg twice daily while monitoring.  While she is mildly tachycardic, she is hemodynamically stable overall with low normal BP at present. Given her troponin elevation, I will obtain a repeat echocardiogram to evaluate for interval changes in left ventricular systolic function and/or regional wall motion. I would recommend continuing Toprol-XL and holding losartan for now.  2.  Elevated troponin: I will check troponins every 6 hours and monitor trend to see if this represents demand ischemia versus an evolving non-STEMI.  For the time being, I will also initiate IV heparin until further data is obtained.  Continue Toprol-XL. I will switch pravastatin to atorvastatin. I will obtain a repeat echocardiogram to evaluate for interval changes in left ventricular systolic function and/or regional wall motion. She has several risk factors for ischemic heart disease including type 2 diabetes mellitus and hyperlipidemia.  3.  Hypertension: Blood pressure is in low normal.  Continue Toprol-XL and hold losartan.  4.  Persistent atrial fibrillation: As stated above, she is currently in sinus tachycardia versus an atrial tachycardia.  I am starting IV heparin for after mentioned reasons and #2.  Her hemoglobin is presently normal.  As stated in Dr. Tanna Furry note, she requires chronic anticoagulation for thromboembolic risk reduction.  5.  Hyperlipidemia: I will switch pravastatin to Lipitor 40 mg.  6.  Hypothyroidism: She is currently on levothyroxine 125 mcg daily.  I will check a TSH.  7.  Type 2 diabetes mellitus: She takes glipizide and Januvia.  As she is being hospitalized, I would recommend basal and prandial insulin.   For questions or updates, please contact [place] Please consult [email or web address] for contact info under Cardiology/STEMI.  Time: 60 minutes.    Signed, Kate Sable, MD  05/09/2017 11:47 AM

## 2017-05-09 NOTE — H&P (Addendum)
History and Physical    [name] KXF:[id number] DOB: 11-22-1955 DOA: 05/09/2017    PCP: Celene Squibb, MD  Patient coming from: home  Chief Complaint: trouble "getting around" due to shortness of breath occasionally  HPI: [name] is a 61 y.o. female with medical history of chronic dCHf (grade 2 on 6/18), persistent A-fib with Amio induced pulmonary toxicity , gout, DM 2 who presents to the ER for mainly for episodes of dyspnea on exertion for 2 wks which have become worse. She is now barely able to walk 10 feet without feeling short of breath, faint and dizzy. She also complains of progressive pedal edema for 2 days and palpitations. She was placed on Metoprolol at the end of Sept for palpitations but she still notices palpitation when she moves or has episodes of feeling hot (when siting still). She states her PCP, Dr Nevada Crane took her off of Xarelto in July which she had been taking for 3 yrs.  She states that she has been having episodes of "feeling hot" associated with shortness of breath and palpitations. She sometimes thinks they are panic attack and otherwise has no issues with anxiety or depression.  I note that she has a mild cough which she states started this morning.   ED Course:  Episodes of SVT,   orthostatic vitals neg (on B blocker at home) Mildly elevated TnI 0.2, Bnp 826  Review of Systems:  Heart burn frequently for which she takes tums All other systems reviewed and apart from HPI, are negative.  Past Medical History:  Diagnosis Date  . Amiodarone toxicity   . Anemia   . Atrial fibrillation ([place]) Jan. 2015  . CHF (congestive heart failure) ([place])   . Chronic bronchitis   . Congenital heart disease   . Diabetes mellitus without complication ([place])    Type II  . Diverticulosis   . Fall due to stumbling October 08, 2013   Due to shoes  . GERD (gastroesophageal reflux disease)   . Gout   . Hemorrhoids   . Hyperlipidemia   . Hyperparathyroidism   .  Hypertension   . Hypothyroidism   . IDA (iron deficiency anemia)   . Microscopic colitis 9/11   Colonoscopy  . Paroxysmal SVT (supraventricular tachycardia) ([place])   . PVD (peripheral vascular disease) ([place]) 09/2004   Right common femoral endarterectomy in April of 2007  . Renal insufficiency   . Schatzki's ring    Last EGD with esophageal dilatation 45F  9/11  . Scoliosis   . Varicose veins right leg pain and swelling    Past Surgical History:  Procedure Laterality Date  . ASD REPAIR  Age 64   [facility]  . COLONOSCOPY  03/04/2010   anal canal hemorrhoids otherwise normal. TI normal. Bx showed lymphocytic colitis. next TCS 02/2020  . COLONOSCOPY  April 2008   [facility]: Pancolonic diverticulosis, external hemorrhoids  . ELAS  06-01-11   Right saphenous ELAS   . ESOPHAGOGASTRODUODENOSCOPY  03/04/2010   noncritical appearing Schatzki ring. SB bx negative  . ESOPHAGOGASTRODUODENOSCOPY  05/2008   erosive reflux esophagatitis, noncritical Schatzki ring  . [place]  2010  . Left hemithyroidectomy    . Left parathyroidectomy  2007  . Parathyroid adenoma  2007  . Right common femoral endarterectomy  2007  . Small bowel capsule  12 2009   Mid to distal small bowel with edema, erosions, tiny ulceration felt to be NSAID related  .  TONSILLECTOMY      Social History:   reports that  has never smoked. she has never used smokeless tobacco. She reports that she does not drink alcohol or use drugs.  Allergies  Allergen Reactions  . Flecainide Nausea Only and Other (See Comments)    Faint feeling  . Hydrocodone-Acetaminophen Nausea Only and Other (See Comments)    Severe headache  . Ibuprofen Other (See Comments)    Kidney dysfunction  . Oxycodone Hcl Nausea Only and Other (See Comments)    Headache  . Penicillins Nausea Only and Other (See Comments)    Severe headache Has patient had a PCN reaction causing immediate rash, facial/tongue/throat  swelling, SOB or lightheadedness with hypotension: No Has patient had a PCN reaction causing severe rash involving mucus membranes or skin necrosis: No Has patient had a PCN reaction that required hospitalization: No Has patient had a PCN reaction occurring within the last 10 years: No If all of the above answers are "NO", then may proceed with Cephalosporin use.     Family History  Problem Relation Age of Onset  . Stroke Mother   . Heart disease Mother        CHF  - Amputation-Right Leg  . Hyperlipidemia Mother   . Hypertension Mother   . Congestive Heart Failure Mother   . Lung cancer Father   . Cancer Father        Lung  . Other Brother        back problems  . Heart attack Paternal Grandmother   . Cancer Maternal Grandmother        uterine     Prior to Admission medications   Medication Sig Start Date End Date Taking? Authorizing Provider  albuterol (PROVENTIL HFA;VENTOLIN HFA) 108 (90 Base) MCG/ACT inhaler Inhale 2 puffs into the lungs every 6 (six) hours as needed for wheezing or shortness of breath. 11/21/16  Yes Rigoberto Noel, MD  allopurinol (ZYLOPRIM) 100 MG tablet Take 100 mg by mouth 2 (two) times daily.  09/14/10  Yes [provider]  calcium carbonate (OS-CAL) 600 MG TABS Take 600 mg by mouth 2 (two) times daily with a meal.     Yes [provider]  cholecalciferol (VITAMIN D) 1000 UNITS tablet Take 1,000 Units by mouth daily.   Yes [provider]  cyclobenzaprine (FLEXERIL) 5 MG tablet Take 1-2 tabs by mouth up to three times daily for muscle spasms. 05/03/17  Yes Mahala Menghini, PA-C  ferrous sulfate 325 (65 FE) MG tablet Take 325 mg by mouth daily with breakfast.   Yes [provider]  furosemide (LASIX) 20 MG tablet TAKE ONE TABLET BY MOUTH DAILY AS NEEDED. Patient taking differently: TAKE ONE TABLET BY MOUTH DAILY AS NEEDED FOR FLUID RETENTION (~EVERY OTHER DAY) 11/15/16  Yes Lendon Colonel, NP  gabapentin (NEURONTIN) 100  MG capsule Take 1 capsule 3 (three) times daily by mouth. 04/20/17  Yes [provider]  GLIPIZIDE XL 5 MG 24 hr tablet Take 5 mg by mouth daily before breakfast. 01/08/17  Yes [provider]  levothyroxine (SYNTHROID, LEVOTHROID) 125 MCG tablet Take 125 mcg by mouth daily before breakfast.   Yes [provider]  loperamide (IMODIUM) 2 MG capsule Take 2-4 mg by mouth 4 (four) times daily as needed for diarrhea or loose stools.   Yes [provider]  losartan (COZAAR) 50 MG tablet Take 50 mg by mouth daily.  12/28/16  Yes [provider]  Magnesium  250 MG TABS Take 250 mg by mouth daily.    Yes [provider]  metoprolol succinate (TOPROL XL) 25 MG 24 hr tablet Take 1 tablet (25 mg total) by mouth daily. 03/16/17  Yes Evans Lance, MD  Omega-3 Fatty Acids (FISH OIL) 1200 MG CAPS Take 1,200 mg by mouth 2 (two) times daily.   Yes [provider]  pantoprazole (PROTONIX) 40 MG tablet Take 40 mg by mouth 2 (two) times daily.    Yes [provider]  polyethylene glycol (MIRALAX / GLYCOLAX) packet Take 17 g by mouth daily as needed (for constipation.).    Yes [provider]  potassium chloride (K-DUR) 10 MEQ tablet Take 10 mEq by mouth daily as needed. WITH LASIX (FLUID RETENTION) ~EVERY OTHER DAY 01/18/17  Yes [provider]  pravastatin (PRAVACHOL) 10 MG tablet Take 10 mg by mouth daily.   Yes [provider]  Probiotic Product (ALIGN PO) Take 1 tablet by mouth daily.    Yes [provider]  sitaGLIPtin (JANUVIA) 50 MG tablet Take 50 mg by mouth daily.   Yes [provider]  temazepam (RESTORIL) 15 MG capsule Take 1 capsule at bedtime by mouth. 02/22/17  Yes [provider]  acetaminophen (TYLENOL) 500 MG tablet Take 1,000 mg by mouth every 6 (six) hours as needed (for pain.).     [provider]    Physical Exam: Wt Readings from Last 3 Encounters:  05/09/17 67.7 kg  (149 lb 4 oz)  03/16/17 68.5 kg (151 lb)  03/14/17 68.5 kg (151 lb)   Vitals:   05/09/17 1130 05/09/17 1200 05/09/17 1353 05/09/17 1700  BP: 103/89 107/81 101/76 106/69  Pulse:   (!) 103 79  Resp: (!) 21 (!) 21    Temp:   97.7 F (36.5 C)   TempSrc:   Oral   SpO2:   97%   Weight:   67.7 kg (149 lb 4 oz)   Height:   4\' 10"  (1.473 m)       Constitutional: NAD, calm, comfortable Eyes: PERTLA, lids and conjunctivae normal ENMT: Mucous membranes are moist. Posterior pharynx clear of any exudate or lesions. Normal dentition.  Neck: normal, supple, no masses, no thyromegaly Respiratory: clear to auscultation bilaterally, no wheezing, no crackles. Normal respiratory effort. No accessory muscle use.  Cardiovascular: S1 & S2 heard, regular rate and rhythm, no murmurs / rubs / gallops. No extremity edema. 2+ pedal pulses. No carotid bruits.  Abdomen: No distension, no tenderness, no masses palpated. No hepatosplenomegaly. Bowel sounds normal.  Musculoskeletal: no clubbing / cyanosis. No joint deformity upper and lower extremities. Good ROM, no contractures. Normal muscle tone.  Skin: no rashes, lesions, ulcers. No induration Neurologic: CN 2-12 grossly intact. Sensation intact, DTR normal. Strength 5/5 in all 4 limbs.  Psychiatric: Normal judgment and insight. Alert and oriented x 3. Normal mood.     Labs on Admission: I have personally reviewed following labs and imaging studies  CBC: Recent Labs  Lab 05/09/17 1010  WBC 9.7  NEUTROABS 6.0  HGB 13.1  HCT 40.9  MCV 92.3  PLT 341   Basic Metabolic Panel: Recent Labs  Lab 05/09/17 1010  NA 133*  K 3.5  CL 100*  CO2 21*  GLUCOSE 194*  BUN 33*  CREATININE 1.68*  CALCIUM 9.1   GFR: Estimated Creatinine Clearance: 28.6 mL/min (A) (by C-G formula based on SCr of 1.68 mg/dL (H)). Liver Function Tests: Recent Labs  Lab 05/09/17 1010  AST 53*  ALT 55*  ALKPHOS 105  BILITOT 0.8  PROT 7.1  ALBUMIN 3.7   No results for  input(s): LIPASE, AMYLASE in the last 168 hours. No results for input(s): AMMONIA in the last 168 hours. Coagulation Profile: Recent Labs  Lab 05/09/17 1011  INR 1.20   Cardiac Enzymes: Recent Labs  Lab 05/09/17 1010 05/09/17 1511  TROPONINI 0.42* 0.37*   BNP (last 3 results) Recent Labs    12/04/16 1111  PROBNP 376.0*   HbA1C: No results for input(s): HGBA1C in the last 72 hours. CBG: Recent Labs  Lab 05/09/17 1623  GLUCAP 91   Lipid Profile: No results for input(s): CHOL, HDL, LDLCALC, TRIG, CHOLHDL, LDLDIRECT in the last 72 hours. Thyroid Function Tests: Recent Labs    05/09/17 1010  TSH 1.676   Anemia Panel: No results for input(s): VITAMINB12, FOLATE, FERRITIN, TIBC, IRON, RETICCTPCT in the last 72 hours. Urine analysis:    Component Value Date/Time   COLORURINE YELLOW 01/05/2017 [place] 01/05/2017 1445   LABSPEC 1.010 01/05/2017 1445   PHURINE 8.0 01/05/2017 1445   GLUCOSEU NEGATIVE 01/05/2017 1445   HGBUR NEGATIVE 01/05/2017 1445   BILIRUBINUR NEGATIVE 01/05/2017 1445   KETONESUR NEGATIVE 01/05/2017 1445   PROTEINUR 30 (A) 01/05/2017 1445   UROBILINOGEN 0.2 11/06/2013 2335   NITRITE NEGATIVE 01/05/2017 1445   LEUKOCYTESUR NEGATIVE 01/05/2017 1445   Sepsis Labs: @LABRCNTIP (procalcitonin:4,lacticidven:4) )No results found for this or any previous visit (from the past 240 hour(s)).   Radiological Exams on Admission: Dg Chest 2 View  Result Date: 05/09/2017 CLINICAL DATA:  Cough and chest congestion with shortness of breath over the past 2 days. History of congenital heart disease, chronic bronchitis, diabetes, never smoked. EXAM: CHEST  2 VIEW COMPARISON:  Chest x-ray of February 23, 2017 and chest CT scan of March 08, 2017. FINDINGS: The lungs are slightly less well inflated today especially on the right. The interstitial markings are coarse and more conspicuous today. The cardiac silhouette is enlarged. The pulmonary vascularity  is engorged similar to that seen previously. There is calcification in the wall of the aortic arch and tortuosity of the descending thoracic aorta. There surgical clips in the upper paratracheal region. There is a small right pleural effusion. IMPRESSION: Chronic interstitial changes bilaterally and pulmonary vascular congestion accentuated by mild hypoinflation. Stable enlargement of cardiac silhouette. Small right pleural effusion, stable. Thoracic aortic atherosclerosis. Electronically Signed   By: David  Martinique M.D.   On: 05/09/2017 10:36    EKG: Independently reviewed. NSR  Assessment/Plan Principal Problem:   Atrial tachycardia - paroxsymal SVT- NSVT - she has not taken Metoprolol today and I have resumed this- despite this, her HR in going up to 160s and she is having runs short runs of V tach  - start Cardizem infusion- follow in SDU- may need Amio - give  K replacement and check Mg (add on to prior lab) and give 2 gm IV Mg+  Active Problems: Dyspnea on exertion- Acute systolic and diastolic CHF - ambulatory pulse ox 89%- checked d dimer which is elevated- due to cardiac instability at this time, will hold off on sending to CT or VQ but would like further imaging tomorrow if she does not improve with diuresis - already on heparin infusion for above a-fib - previous ECHO showed normal EF and grade 2dCHF (6/18) - 2 D ECHO today:  Mild LVH with LVEF approximately 25% and akinesis of the mid to   basal inferior/inferolateral  wall. Indeterminate diastolic   function. LVEF has decreased compared to the prior study from   June. Severe left atrial enlargement. Mild mitral annular   calcification with mild mitral regurgitation. Moderate RV   dysfunction. Mild to moderate tricuspid regurgitation with PASP   estimated 45 mmHg.  Elevated troponin - mild- due to above- cont to trend  Hypotension - cont Metoprolol - hold Losartan    Varicose veins of bilateral lower extremities with other  complications - R > L pedal edema    DM (diabetes mellitus)  - hold Glipizide and place on ISS    CKD (chronic kidney disease), stage III -stable    OSA (obstructive sleep apnea) - CPAP at bedtime    ILD (interstitial lung disease)    IDA - cont Iron  GERD - Protonix  HLD - statin     DVT prophylaxis: Heparin infusion Code Status: full code  Family Communication:   Disposition Plan: SDU  Consults called: cardiology  Admission status: admit    Debbe Odea MD [facility] Pager: [email or web address] Password [email or web address] [phone], please contact night-coverage   05/09/2017, 5:22 PM

## 2017-05-09 NOTE — Progress Notes (Signed)
Telemetry showed [name] HR sustaining in the 170s with tele tech report of short runs of [place]. Dr Wynelle Cleveland was made aware and orders for medications and transfer to stepdown were received.  Pt was transferred to step down with diltiazem gtt infusing  See MAR  Bedside report given to Matthew Saras RN

## 2017-05-09 NOTE — ED Notes (Addendum)
Date and time results received: 05/09/17   Test: Troponin Critical Value: 0.42  Name of Provider Notified: Dr. Thurnell Garbe  Orders Received? Or Actions Taken?: No new orders given at this time.

## 2017-05-09 NOTE — ED Provider Notes (Signed)
[facility] EMERGENCY DEPARTMENT Provider Note   CSN: [id number] Arrival date & time: 05/09/17  7253     History   Chief Complaint Chief Complaint  Patient presents with  . Shortness of Breath    [name] is a 61 y.o. female.  Tamara  Pt was seen at 1030. Per pt and her family, c/o gradual onset and worsening of persistent SOB for the past 2 weeks. SOB worsens on exertion. Has been associated with increasing pedal edema bilat, palpitations and "heart beating hard." Denies CP, no cough, no back pain, no N/V/D, no abd pain, no calf/LE pain or unilateral swelling.     Past Medical History:  Diagnosis Date  . Amiodarone toxicity   . Anemia   . Atrial fibrillation ([place]) Jan. 2015  . CHF (congestive heart failure) ([place])   . Chronic bronchitis   . Congenital heart disease   . Diabetes mellitus without complication ([place])    Type II  . Diverticulosis   . Fall due to stumbling October 08, 2013   Due to shoes  . GERD (gastroesophageal reflux disease)   . Gout   . Hemorrhoids   . Hyperlipidemia   . Hyperparathyroidism   . Hypertension   . Hypothyroidism   . IDA (iron deficiency anemia)   . Microscopic colitis 9/11   Colonoscopy  . Paroxysmal SVT (supraventricular tachycardia) ([place])   . PVD (peripheral vascular disease) ([place]) 09/2004   Right common femoral endarterectomy in April of 2007  . Renal insufficiency   . Schatzki's ring    Last EGD with esophageal dilatation 64F  9/11  . Scoliosis   . Varicose veins right leg pain and swelling    Patient Active Problem List   Diagnosis Date Noted  . LLQ pain 01/04/2017  . ILD (interstitial lung disease) ([place]) 11/21/2016  . Hemorrhoids 08/21/2016  . IDA (iron deficiency anemia) 01/11/2016  . Diverticulosis of colon without hemorrhage   . Abdominal pain, right upper quadrant 10/29/2015  . Rectal bleeding 10/29/2015  . Normocytic anemia 10/29/2015  . Dysphagia   . Abnormality of esophagus 04/29/2015  . Esophageal  dysphagia 03/26/2015  . OSA (obstructive sleep apnea) 12/31/2013  . Atrial fibrillation with RVR ([place]) 11/09/2013  . Acute diastolic CHF (congestive heart failure) ([place]) 11/09/2013  . CKD (chronic kidney disease), stage III ([place]) 11/09/2013  . CKD (chronic kidney disease) 11/07/2013  . Hyperkalemia 11/07/2013  . Diastolic dysfunction 66/44/0347  . DM (diabetes mellitus) ([place]) 11/07/2013  . CHF (congestive heart failure) ([place]) 11/07/2013  . Varicose veins of lower extremities with ulcer ([place]) 10/17/2013  . Cold-Left Hand / Foot 10/08/2013  . Circulation problem-Left foot 10/08/2013  . Atherosclerosis of native arteries of the extremities with ulceration(440.23) 10/08/2013  . A-fib ([place]) 08/05/2013  . Constipation 04/11/2013  . Peripheral vascular disease, unspecified ([place]) 10/27/2011  . Varicose veins of lower extremities with other complications 42/59/5638  . COLLAGENOUS COLITIS 04/08/2010  . GERD 02/18/2010  . HYPERTHYROIDISM 02/11/2010    Past Surgical History:  Procedure Laterality Date  . ASD REPAIR  Age 15   [facility]  . COLONOSCOPY  03/04/2010   anal canal hemorrhoids otherwise normal. TI normal. Bx showed lymphocytic colitis. next TCS 02/2020  . COLONOSCOPY  April 2008   [facility]: Pancolonic diverticulosis, external hemorrhoids  . ELAS  06-01-11   Right saphenous ELAS   . ESOPHAGOGASTRODUODENOSCOPY  03/04/2010   noncritical appearing Schatzki ring. SB bx negative  . ESOPHAGOGASTRODUODENOSCOPY  05/2008  erosive reflux esophagatitis, noncritical Schatzki ring  . [place]  2010  . Left hemithyroidectomy    . Left parathyroidectomy  2007  . Parathyroid adenoma  2007  . Right common femoral endarterectomy  2007  . Small bowel capsule  12 2009   Mid to distal small bowel with edema, erosions, tiny ulceration felt to be NSAID related  . TONSILLECTOMY      OB History    Gravida Para Term Preterm AB Living   0 0 0 0 0 0   SAB TAB  Ectopic Multiple Live Births   0 0 0 0 0       Home Medications    Prior to Admission medications   Medication Sig Start Date End Date Taking? Authorizing Provider  acetaminophen (TYLENOL) 500 MG tablet Take 1,000 mg by mouth every 6 (six) hours as needed (for pain.).     [provider]  albuterol (PROVENTIL HFA;VENTOLIN HFA) 108 (90 Base) MCG/ACT inhaler Inhale 2 puffs into the lungs every 6 (six) hours as needed for wheezing or shortness of breath. 11/21/16   Rigoberto Noel, MD  allopurinol (ZYLOPRIM) 100 MG tablet Take 100 mg by mouth 2 (two) times daily.  09/14/10   [provider]  calcium carbonate (OS-CAL) 600 MG TABS Take 600 mg by mouth 2 (two) times daily with a meal.      [provider]  cholecalciferol (VITAMIN D) 1000 UNITS tablet Take 1,000 Units by mouth daily.    [provider]  cyclobenzaprine (FLEXERIL) 5 MG tablet Take 1-2 tabs by mouth up to three times daily for muscle spasms. 05/03/17   Mahala Menghini, PA-C  ferrous sulfate 325 (65 FE) MG tablet Take 325 mg by mouth daily with breakfast.    [provider]  furosemide (LASIX) 20 MG tablet TAKE ONE TABLET BY MOUTH DAILY AS NEEDED. Patient taking differently: TAKE ONE TABLET BY MOUTH DAILY AS NEEDED FOR FLUID RETENTION (~EVERY OTHER DAY) 11/15/16   Lendon Colonel, NP  GLIPIZIDE XL 5 MG 24 hr tablet Take 5 mg by mouth daily before breakfast. 01/08/17   [provider]  levothyroxine (SYNTHROID, LEVOTHROID) 125 MCG tablet Take 125 mcg by mouth daily before breakfast.    [provider]  loperamide (IMODIUM) 2 MG capsule Take 2-4 mg by mouth 4 (four) times daily as needed for diarrhea or loose stools.    [provider]  losartan (COZAAR) 50 MG tablet Take 50 mg by mouth daily.  12/28/16   [provider]  Magnesium 250 MG TABS Take 250 mg by mouth daily.     [provider]  metoprolol succinate (TOPROL XL) 25 MG 24 hr tablet Take 1  tablet (25 mg total) by mouth daily. 03/16/17   Evans Lance, MD  Omega-3 Fatty Acids (FISH OIL) 1200 MG CAPS Take 1,200 mg by mouth 2 (two) times daily.    [provider]  pantoprazole (PROTONIX) 40 MG tablet Take 40 mg by mouth 2 (two) times daily.     [provider]  polyethylene glycol (MIRALAX / GLYCOLAX) packet Take 17 g by mouth daily as needed (for constipation.).     [provider]  potassium chloride (K-DUR) 10 MEQ tablet Take 10 mEq by mouth daily as needed. WITH LASIX (FLUID RETENTION) ~EVERY OTHER DAY 01/18/17   [provider]  pravastatin (PRAVACHOL) 10 MG tablet Take 10 mg by mouth daily.    [provider]  Probiotic  Product (ALIGN PO) Take 1 tablet by mouth daily.     [provider]  sitaGLIPtin (JANUVIA) 50 MG tablet Take 50 mg by mouth daily.    [provider]    Family History Family History  Problem Relation Age of Onset  . Stroke Mother   . Heart disease Mother        CHF  - Amputation-Right Leg  . Hyperlipidemia Mother   . Hypertension Mother   . Congestive Heart Failure Mother   . Lung cancer Father   . Cancer Father        Lung  . Other Brother        back problems  . Heart attack Paternal Grandmother   . Cancer Maternal Grandmother        uterine    Social History Social History   Tobacco Use  . Smoking status: Never Smoker  . Smokeless tobacco: Never Used  . Tobacco comment: Never smoked  Substance Use Topics  . Alcohol use: No    Alcohol/week: 0.0 oz  . Drug use: No     Allergies   Flecainide; Hydrocodone-acetaminophen; Ibuprofen; Oxycodone hcl; and Penicillins   Review of Systems Review of Systems ROS: Statement: All systems negative except as marked or noted in the Tamara; Constitutional: Negative for fever and chills. ; ; Eyes: Negative for eye pain, redness and discharge. ; ; ENMT: Negative for ear pain, hoarseness, nasal congestion, sinus pressure and sore throat. ; ;  Cardiovascular: Negative for chest pain, diaphoresis, +palpitations, dyspnea and peripheral edema. ; ; Respiratory: Negative for cough, wheezing and stridor. ; ; Gastrointestinal: Negative for nausea, vomiting, diarrhea, abdominal pain, blood in stool, hematemesis, jaundice and rectal bleeding. . ; ; Genitourinary: Negative for dysuria, flank pain and hematuria. ; ; Musculoskeletal: Negative for back pain and neck pain. Negative for swelling and trauma.; ; Skin: Negative for pruritus, rash, abrasions, blisters, bruising and skin lesion.; ; Neuro: Negative for headache, lightheadedness and neck stiffness. Negative for weakness, altered level of consciousness, altered mental status, extremity weakness, paresthesias, involuntary movement, seizure and syncope.       Physical Exam Updated Vital Signs BP 101/74 (BP Location: Right Arm)   Pulse (!) 103   Temp 97.8 F (36.6 C) (Oral)   Resp (!) 23   Ht 4\' 10"  (1.473 m)   Wt 66.2 kg (146 lb)   SpO2 98%   BMI 30.51 kg/m   Physical Exam 1035: Physical examination:  Nursing notes reviewed; Vital signs and O2 SAT reviewed;  Constitutional: Well developed, Well nourished, Well hydrated, In no acute distress; Head:  Normocephalic, atraumatic; Eyes: EOMI, PERRL, No scleral icterus; ENMT: Mouth and pharynx normal, Mucous membranes moist; Neck: Supple, Full range of motion, No lymphadenopathy; Cardiovascular: Irregular rate and rhythm, No gallop; Respiratory: Breath sounds coarse & equal bilaterally, No wheezes.  Speaking full sentences with ease, Normal respiratory effort/excursion; Chest: Nontender, Movement normal; Abdomen: Soft, Nontender, Nondistended, Normal bowel sounds; Genitourinary: No CVA tenderness; Extremities: Pulses normal, No tenderness, +1 pedal edema bilat. No calf asymmetry.; Neuro: AA&Ox3, Major CN grossly intact.  Speech clear. No gross focal motor or sensory deficits in extremities.; Skin: Color normal, Warm, Dry.   ED Treatments / Results   Labs (all labs ordered are listed, but only abnormal results are displayed)   EKG  EKG Interpretation  Date/Time:  Wednesday May 09 2017 10:09:47 EST Ventricular Rate:  103 PR Interval:    QRS Duration: 99 QT Interval:  364 QTC Calculation: 477  R Axis:   145 Text Interpretation:  Sinus or ectopic atrial tachycardia Atrial premature complex Borderline prolonged PR interval Probable right ventricular hypertrophy Inferolateral infarct, old Baseline wander When compared with ECG of 03/20/2017 Atrial tachycardia is now Present T wave abnormality Anterior leads is no longer Present Confirmed by Francine Graven [phone]) on 05/09/2017 10:41:11 AM       Radiology   Procedures Procedures (including critical care time)  Medications Ordered in ED Medications  furosemide (LASIX) injection 40 mg (not administered)     Initial Impression / Assessment and Plan / ED Course  I have reviewed the triage vital signs and the nursing notes.  Pertinent labs & imaging results that were available during my care of the patient were reviewed by me and considered in my medical decision making (see chart for details).  MDM Reviewed: previous chart, nursing note and vitals Reviewed previous: labs and ECG Interpretation: labs, ECG and x-ray Consults: cardiology and admitting MD    Results for orders placed or performed during the hospital encounter of 05/09/17  CBC with Differential  Result Value Ref Range   WBC 9.7 4.0 - 10.5 K/uL   RBC 4.43 3.87 - 5.11 MIL/uL   Hemoglobin 13.1 12.0 - 15.0 g/dL   HCT 40.9 36.0 - 46.0 %   MCV 92.3 78.0 - 100.0 fL   MCH 29.6 26.0 - 34.0 pg   MCHC 32.0 30.0 - 36.0 g/dL   RDW 17.2 (H) 11.5 - 15.5 %   Platelets 205 150 - 400 K/uL   Neutrophils Relative % 61 %   Neutro Abs 6.0 1.7 - 7.7 K/uL   Lymphocytes Relative 28 %   Lymphs Abs 2.7 0.7 - 4.0 K/uL   Monocytes Relative 7 %   Monocytes Absolute 0.7 0.1 - 1.0 K/uL   Eosinophils Relative 3 %    Eosinophils Absolute 0.3 0.0 - 0.7 K/uL   Basophils Relative 1 %   Basophils Absolute 0.1 0.0 - 0.1 K/uL  Comprehensive metabolic panel  Result Value Ref Range   Sodium 133 (L) 135 - 145 mmol/L   Potassium 3.5 3.5 - 5.1 mmol/L   Chloride 100 (L) 101 - 111 mmol/L   CO2 21 (L) 22 - 32 mmol/L   Glucose, Bld 194 (H) 65 - 99 mg/dL   BUN 33 (H) 6 - 20 mg/dL   Creatinine, Ser 1.68 (H) 0.44 - 1.00 mg/dL   Calcium 9.1 8.9 - 10.3 mg/dL   Total Protein 7.1 6.5 - 8.1 g/dL   Albumin 3.7 3.5 - 5.0 g/dL   AST 53 (H) 15 - 41 U/L   ALT 55 (H) 14 - 54 U/L   Alkaline Phosphatase 105 38 - 126 U/L   Total Bilirubin 0.8 0.3 - 1.2 mg/dL   GFR calc non Af Amer 32 (L) >60 mL/min   GFR calc Af Amer 37 (L) >60 mL/min   Anion gap 12 5 - 15  Troponin I  Result Value Ref Range   Troponin I 0.42 (HH) <0.03 ng/mL  Brain natriuretic peptide  Result Value Ref Range   B Natriuretic Peptide 826.0 (H) 0.0 - 100.0 pg/mL   Dg Chest 2 View Result Date: 05/09/2017 CLINICAL DATA:  Cough and chest congestion with shortness of breath over the past 2 days. History of congenital heart disease, chronic bronchitis, diabetes, never smoked. EXAM: CHEST  2 VIEW COMPARISON:  Chest x-ray of February 23, 2017 and chest CT scan of March 08, 2017. FINDINGS: The lungs are slightly  less well inflated today especially on the right. The interstitial markings are coarse and more conspicuous today. The cardiac silhouette is enlarged. The pulmonary vascularity is engorged similar to that seen previously. There is calcification in the wall of the aortic arch and tortuosity of the descending thoracic aorta. There surgical clips in the upper paratracheal region. There is a small right pleural effusion. IMPRESSION: Chronic interstitial changes bilaterally and pulmonary vascular congestion accentuated by mild hypoinflation. Stable enlargement of cardiac silhouette. Small right pleural effusion, stable. Thoracic aortic atherosclerosis. Electronically  Signed   By: David  [place]ique M.D.   On: 05/09/2017 10:36   Results for ANNALYN, BLECHER (MRN [id number]) as of 05/09/2017 11:16  Ref. Range 12/04/2016 11:11 01/04/2017 13:02 01/05/2017 12:47 05/09/2017 10:10  BUN Latest Ref Range: 6 - 20 mg/dL 32 (H) 22 (H) 18 33 (H)  Creatinine Latest Ref Range: 0.44 - 1.00 mg/dL 1.42 (H) 1.63 (H) 1.70 (H) 1.68 (H)     1120:  While talking to me during pt's exam, pt's HR will increase to 130's, appears afib, then decrease again to 90-100's (irregular).  Pt ambulated with Sats dropping to 89% R/A from 98% R/A and pt c/o increasing SOB. BNP elevated, no old to compare. IV lasix given. Troponin elevated, but no acute STTW is present.  Dx and testing d/w pt and family.  Questions answered.  Verb understanding, agreeable to admit.  T/C to [facility] Dr. Bronson Ing, case discussed, including:  Tamara, pertinent PM/SHx, VS/PE, dx testing, ED course and treatment:  Agreeable to consult.   1150:  T/C to [facility] Dr. Wynelle Cleveland, case discussed, including:  Tamara, pertinent PM/SHx, VS/PE, dx testing, ED course and treatment:  Agreeable to admit.     Final Clinical Impressions(s) / ED Diagnoses   Final diagnoses:  None    ED Discharge Orders    None       Francine Graven, DO 05/11/17 1404

## 2017-05-10 ENCOUNTER — Inpatient Hospital Stay (HOSPITAL_COMMUNITY): Payer: PPO

## 2017-05-10 ENCOUNTER — Other Ambulatory Visit: Payer: Self-pay

## 2017-05-10 DIAGNOSIS — I4891 Unspecified atrial fibrillation: Secondary | ICD-10-CM

## 2017-05-10 DIAGNOSIS — G4733 Obstructive sleep apnea (adult) (pediatric): Secondary | ICD-10-CM

## 2017-05-10 DIAGNOSIS — I5043 Acute on chronic combined systolic (congestive) and diastolic (congestive) heart failure: Secondary | ICD-10-CM | POA: Diagnosis present

## 2017-05-10 DIAGNOSIS — I5021 Acute systolic (congestive) heart failure: Secondary | ICD-10-CM

## 2017-05-10 DIAGNOSIS — I214 Non-ST elevation (NSTEMI) myocardial infarction: Secondary | ICD-10-CM

## 2017-05-10 DIAGNOSIS — N183 Chronic kidney disease, stage 3 (moderate): Secondary | ICD-10-CM

## 2017-05-10 DIAGNOSIS — J849 Interstitial pulmonary disease, unspecified: Secondary | ICD-10-CM

## 2017-05-10 DIAGNOSIS — I83893 Varicose veins of bilateral lower extremities with other complications: Secondary | ICD-10-CM

## 2017-05-10 LAB — BASIC METABOLIC PANEL
Anion gap: 10 (ref 5–15)
BUN: 32 mg/dL — AB (ref 6–20)
CHLORIDE: 104 mmol/L (ref 101–111)
CO2: 25 mmol/L (ref 22–32)
CREATININE: 1.69 mg/dL — AB (ref 0.44–1.00)
Calcium: 9.3 mg/dL (ref 8.9–10.3)
GFR calc Af Amer: 37 mL/min — ABNORMAL LOW (ref >60)
GFR calc non Af Amer: 32 mL/min — ABNORMAL LOW (ref >60)
Glucose, Bld: 111 mg/dL — ABNORMAL HIGH (ref 65–99)
Potassium: 4.2 mmol/L (ref 3.5–5.1)
SODIUM: 139 mmol/L (ref 135–145)

## 2017-05-10 LAB — HEPARIN LEVEL (UNFRACTIONATED)
Heparin Unfractionated: 0.23 IU/mL — ABNORMAL LOW (ref 0.30–0.70)
Heparin Unfractionated: 0.38 IU/mL (ref 0.30–0.70)

## 2017-05-10 LAB — CBC
HCT: 39.5 % (ref 36.0–46.0)
HEMOGLOBIN: 12.6 g/dL (ref 12.0–15.0)
MCH: 29.5 pg (ref 26.0–34.0)
MCHC: 31.9 g/dL (ref 30.0–36.0)
MCV: 92.5 fL (ref 78.0–100.0)
Platelets: 219 10*3/uL (ref 150–400)
RBC: 4.27 MIL/uL (ref 3.87–5.11)
RDW: 17.2 % — ABNORMAL HIGH (ref 11.5–15.5)
WBC: 8.9 10*3/uL (ref 4.0–10.5)

## 2017-05-10 LAB — GLUCOSE, CAPILLARY
GLUCOSE-CAPILLARY: 116 mg/dL — AB (ref 65–99)
GLUCOSE-CAPILLARY: 140 mg/dL — AB (ref 65–99)
GLUCOSE-CAPILLARY: 107 mg/dL — AB (ref 65–99)
Glucose-Capillary: 95 mg/dL (ref 65–99)

## 2017-05-10 LAB — OCCULT BLOOD X 1 CARD TO LAB, STOOL: Fecal Occult Bld: NEGATIVE

## 2017-05-10 LAB — MRSA PCR SCREENING: MRSA by PCR: NEGATIVE

## 2017-05-10 LAB — MAGNESIUM: MAGNESIUM: 2 mg/dL (ref 1.7–2.4)

## 2017-05-10 LAB — TROPONIN I: TROPONIN I: 0.33 ng/mL — AB (ref <0.03)

## 2017-05-10 MED ORDER — ASPIRIN EC 81 MG PO TBEC
81.0000 mg | DELAYED_RELEASE_TABLET | Freq: Every day | ORAL | Status: DC
  Administered 2017-05-10 – 2017-05-17 (×8): 81 mg via ORAL
  Filled 2017-05-10 (×8): qty 1

## 2017-05-10 MED ORDER — ATORVASTATIN CALCIUM 40 MG PO TABS
40.0000 mg | ORAL_TABLET | Freq: Every day | ORAL | Status: DC
  Administered 2017-05-10 – 2017-05-16 (×7): 40 mg via ORAL
  Filled 2017-05-10 (×7): qty 1

## 2017-05-10 MED ORDER — ORAL CARE MOUTH RINSE
15.0000 mL | Freq: Two times a day (BID) | OROMUCOSAL | Status: DC
  Administered 2017-05-10 – 2017-05-17 (×11): 15 mL via OROMUCOSAL

## 2017-05-10 MED ORDER — METOPROLOL SUCCINATE ER 50 MG PO TB24
50.0000 mg | ORAL_TABLET | Freq: Every day | ORAL | Status: DC
  Administered 2017-05-10 – 2017-05-11 (×2): 50 mg via ORAL
  Filled 2017-05-10 (×2): qty 1

## 2017-05-10 NOTE — Progress Notes (Addendum)
[place] for Heparin Indication: chest pain/ACS  Allergies  Allergen Reactions  . Flecainide Nausea Only and Other (See Comments)    Faint feeling  . Hydrocodone-Acetaminophen Nausea Only and Other (See Comments)    Severe headache  . Ibuprofen Other (See Comments)    Kidney dysfunction  . Oxycodone Hcl Nausea Only and Other (See Comments)    Headache  . Penicillins Nausea Only and Other (See Comments)    Severe headache Has patient had a PCN reaction causing immediate rash, facial/tongue/throat swelling, SOB or lightheadedness with hypotension: No Has patient had a PCN reaction causing severe rash involving mucus membranes or skin necrosis: No Has patient had a PCN reaction that required hospitalization: No Has patient had a PCN reaction occurring within the last 10 years: No If all of the above answers are "NO", then may proceed with Cephalosporin use.     Patient Measurements: Height: 4\' 10"  (147.3 cm) Weight: 147 lb 0.8 oz (66.7 kg) IBW/kg (Calculated) : 40.9 HEPARIN DW (KG): 56.1   Vital Signs: Temp: 97.9 F (36.6 C) (11/15 0721) Temp Source: Oral (11/15 0721) BP: 117/90 (11/15 0600) Pulse Rate: 94 (11/15 0600)  Labs: Recent Labs    05/09/17 1010 05/09/17 1011 05/09/17 1511 05/09/17 2104 05/10/17 0424  HGB 13.1  --   --   --  12.6  HCT 40.9  --   --   --  39.5  PLT 205  --   --   --  219  APTT  --  31  --   --   --   LABPROT  --  15.1  --   --   --   INR  --  1.20  --   --   --   HEPARINUNFRC  --   --   --  0.13* 0.23*  CREATININE 1.68*  --   --   --  1.69*  TROPONINI 0.42*  --  0.37* 0.35* 0.33*    Estimated Creatinine Clearance: 28.3 mL/min (A) (by C-G formula based on SCr of 1.69 mg/dL (H)).  Assessment: 61 yo lady on heparin for CP. Heparin level below goal this AM.  No bleeding noted, CBC stable.  Afib and Elevated troponin: Troponins remain elevated but trending down.  Demand ischemia versus an evolving  non-STEMI.   Goal of Therapy:  Heparin level 0.3-0.7 units/ml Monitor platelets by anticoagulation protocol: Yes   Plan:  Increase heparin drip to 1000 units/hr Repeat heparin level in 6-8 hours. Daily heparin level and CBC Monitor for bleeding complications  Pricilla Larsson 05/10/2017,8:25 AM   Addum:  Heparin level now therapeutic.  Cont drip at 1000 units/hr.  F/u am labs Excell Seltzer, PharmD

## 2017-05-10 NOTE — Progress Notes (Signed)
Progress Note  Patient Name: [name] Date of Encounter: 05/10/2017  Primary Cardiologist: Dr. Lovena Le  Subjective   Breathing improved but not at baseline. Palpitations improved. Reports having episodes of bilateral leg pain for the past few weeks.   Inpatient Medications    Scheduled Meds: . allopurinol  100 mg Oral BID  . calcium carbonate  1,250 mg Oral BID WC  . cholecalciferol  1,000 Units Oral Daily  . ferrous sulfate  325 mg Oral Q breakfast  . furosemide  40 mg Intravenous BID  . gabapentin  100 mg Oral TID  . insulin aspart  0-5 Units Subcutaneous QHS  . insulin aspart  0-9 Units Subcutaneous TID WC  . levothyroxine  125 mcg Oral QAC breakfast  . magnesium oxide  400 mg Oral Daily  . mouth rinse  15 mL Mouth Rinse BID  . metoprolol succinate  25 mg Oral Daily  . pantoprazole  40 mg Oral BID  . pravastatin  20 mg Oral Daily  . sodium chloride flush  3 mL Intravenous Q12H   Continuous Infusions: . sodium chloride    . diltiazem (CARDIZEM) infusion 2.5 mg/hr (05/10/17 0600)  . heparin 850 Units/hr (05/10/17 0600)   PRN Meds: sodium chloride, albuterol, sodium chloride flush, temazepam   Vital Signs    Vitals:   05/10/17 0400 05/10/17 0500 05/10/17 0600 05/10/17 0721  BP: (!) 85/40 (!) 95/59 117/90   Pulse: 74 72 94   Resp: 19 17 14    Temp: 97.9 F (36.6 C)   97.9 F (36.6 C)  TempSrc: Oral   Oral  SpO2: 96% 97% 95%   Weight:  147 lb 0.8 oz (66.7 kg)    Height:        Intake/Output Summary (Last 24 hours) at 05/10/2017 0826 Last data filed at 05/10/2017 0744 Gross per 24 hour  Intake 123 ml  Output 2550 ml  Net -2427 ml   Filed Weights   05/09/17 1010 05/09/17 1353 05/10/17 0500  Weight: 146 lb (66.2 kg) 149 lb 4 oz (67.7 kg) 147 lb 0.8 oz (66.7 kg)    Telemetry    Atrial fibrillation, HR in 70's to low-100's.  - Personally Reviewed  ECG    No new tracings.   Physical Exam   General: Well developed, well nourished Caucasian  female appearing in no acute distress. Head: Normocephalic, atraumatic.  Neck: Supple without bruits, JVD at 9cm. Lungs:  Resp regular and unlabored, rales along bases bilaterally. Heart: Irregularly irregular, S1, S2, no S3, S4, or murmur; no rub. Abdomen: Soft, non-tender, non-distended with normoactive bowel sounds. No hepatomegaly. No rebound/guarding. No obvious abdominal masses. Extremities: No clubbing or cyanosis, 1+ pitting edema bilaterally. Distal pedal pulses are 2+ bilaterally. Neuro: Alert and oriented X 3. Moves all extremities spontaneously. Psych: Normal affect.  Labs    Chemistry Recent Labs  Lab 05/09/17 1010 05/10/17 0424  NA 133* 139  K 3.5 4.2  CL 100* 104  CO2 21* 25  GLUCOSE 194* 111*  BUN 33* 32*  CREATININE 1.68* 1.69*  CALCIUM 9.1 9.3  PROT 7.1  --   ALBUMIN 3.7  --   AST 53*  --   ALT 55*  --   ALKPHOS 105  --   BILITOT 0.8  --   GFRNONAA 32* 32*  GFRAA 37* 37*  ANIONGAP 12 10     Hematology Recent Labs  Lab 05/09/17 1010 05/10/17 0424  WBC 9.7 8.9  RBC 4.43 4.27  HGB 13.1 12.6  HCT 40.9 39.5  MCV 92.3 92.5  MCH 29.6 29.5  MCHC 32.0 31.9  RDW 17.2* 17.2*  PLT 205 219    Cardiac Enzymes Recent Labs  Lab 05/09/17 1010 05/09/17 1511 05/09/17 2104 05/10/17 0424  TROPONINI 0.42* 0.37* 0.35* 0.33*   No results for input(s): TROPIPOC in the last 168 hours.   BNP Recent Labs  Lab 05/09/17 1010  BNP 826.0*     DDimer  Recent Labs  Lab 05/09/17 1511  DDIMER 2.26*     Radiology    Dg Chest 2 View  Result Date: 05/09/2017 CLINICAL DATA:  Cough and chest congestion with shortness of breath over the past 2 days. History of congenital heart disease, chronic bronchitis, diabetes, never smoked. EXAM: CHEST  2 VIEW COMPARISON:  Chest x-ray of February 23, 2017 and chest CT scan of March 08, 2017. FINDINGS: The lungs are slightly less well inflated today especially on the right. The interstitial markings are coarse and more  conspicuous today. The cardiac silhouette is enlarged. The pulmonary vascularity is engorged similar to that seen previously. There is calcification in the wall of the aortic arch and tortuosity of the descending thoracic aorta. There surgical clips in the upper paratracheal region. There is a small right pleural effusion. IMPRESSION: Chronic interstitial changes bilaterally and pulmonary vascular congestion accentuated by mild hypoinflation. Stable enlargement of cardiac silhouette. Small right pleural effusion, stable. Thoracic aortic atherosclerosis. Electronically Signed   By: David  Martinique M.D.   On: 05/09/2017 10:36    Cardiac Studies   Echocardiogram: 05/09/2017 Study Conclusions  - Left ventricle: The cavity size was normal. Wall thickness was   increased in a pattern of mild LVH. The estimated ejection   fraction was 25%. There is akinesis of the basal-midinferolateral   and inferior myocardium. The study is not technically sufficient   to allow evaluation of LV diastolic function. - Aortic valve: Mildly calcified annulus. Trileaflet. There was   trivial regurgitation. - Mitral valve: Mildly calcified annulus. Mildly thickened leaflets   . There was mild regurgitation. - Left atrium: The atrium was severely dilated. - Right ventricle: Systolic function was moderately reduced. - Right atrium: The atrium was mildly dilated. Central venous   pressure (est): 3 mm Hg. - Tricuspid valve: There was mild-moderate regurgitation. - Pulmonary arteries: PA peak pressure: 45 mm Hg (S). - Pericardium, extracardiac: There was no pericardial effusion.  Impressions:  - Mild LVH with LVEF approximately 25% and akinesis of the mid to   basal inferior/inferolateral wall. Indeterminate diastolic   function. LVEF has decreased compared to the prior study from   June. Severe left atrial enlargement. Mild mitral annular   calcification with mild mitral regurgitation. Moderate RV   dysfunction.  Mild to moderate tricuspid regurgitation with PASP   estimated 45 mmHg.  Patient Profile     61 y.o. female w/ PMH of persistent atrial fibrillation (declines anticoagulation), amiodarone induced pulmonary toxicity, chronic diastolic heart failure, HTN, HLD, and Type 2 DM who presented to [facility] ED on 05/09/2017 for evaluation of worsening dyspnea. Cardiology consulted for further evaluation.   Assessment & Plan    1.  Progressive dyspnea on exertion/ Acute on Chronic Combined Systolic and Diastolic CHF - presented with worsening dyspnea on exertion and orthopnea, found to have an elevated BNP of 826 and CXR showing pulmonary vascular congestion.  - she has been started on IV Lasix 40mg  BID with a recorded net output of -2.4L thus far. Continue  to follow I&O's along with daily weights.  - a repeat echocardiogram was obtained on 11/14 and showed a newly reduced EF of 25% with akinesis of the basal-mid inferolateral and inferior myocardium. EF was previously preserved at 50-55% by echo in 11/2016 with no regional WMA noted at that time.  - continue with IV diuresis as she is still volume overloaded and with orthopnea. Anticipate a cardiac catheterization once volume status has improved.   2.  Elevated troponin - cyclic values have been elevated at 0.42, 0.37, 0.35, and 0.33. - echo shows a reduced EF with WMA as above. Plan for cardiac catheterization later this admission once volume status improves.  - remains on Heparin, statin, and BB therapy.   3. Hypertension - Losartan held. Will titrate Toprol-XL to 50mg  daily for improved rate control.   4.  Persistent atrial fibrillation - HR improved and now in the 70's - 80's. Would stop IV Cardizem in the setting of her reduced EF and further titrate Toprol-XL to 50mg  daily.  - she was previously on Xarelto but this was held in August due to concern for a GIB and she has not been on anticoagulation since. Continue Heparin at this time. Plan for lower  extremity dopplers to evaluate for a DVT as outlined below.    5. Hyperlipidemia - on Pravastatin PTA. Will switch to Lipitor 40 mg daily as outlined in prior notes.  6. Hypothyroidism - TSH is within normal limits this admission.  - continue Synthroid.   7. Type 2 diabetes mellitus - on SSI. Per admitting team.   8. Stage 3 CKD - baseline creatinine 1.4 - 1.5. At 1.68 on admission and stable at 1.69 today. Follow closely with diuresis.   9. Elevated D-dimer - at 2.26 on admission. Has not undergone a CTA due to her CKD. Recommend lower extremity dopplers to evaluate for DVT in the setting of her leg pain and not being on anticoagulation.     For questions or updates, please contact [place] Please consult [email or web address] for contact info under Cardiology/STEMI.   Signed, Erma Heritage , PA-C 8:26 AM 05/10/2017 Pager: [phone]  The patient was seen and examined, and I agree with the history, physical exam, assessment and plan as documented above, with modifications as noted below.  Clinically, she is doing better today with less pronounced orthopnea.  Shortness of breath has improved as has bilateral leg swelling and pain.  She continues to deny chest pain. I would recommend continuing Lasix 40 mg IV twice daily.  Echocardiogram which I ordered yesterday and as reviewed above demonstrates a marked reduction in left ventricular systolic function and no regional wall motion abnormalities.  She will eventually require coronary angiography once volume status has stabilized.  I would recommend continuing heparin at this time.  She is on metoprolol succinate, aspirin, and Lipitor.  Losartan has been held due to hypotension.  She is in atrial fibrillation.  She was started on an IV diltiazem drip yesterday by internal medicine but this should be discontinued as it is a negative inotrope in the context of severe left ventricular dysfunction.  The dose of Toprol-XL will  be increased cautiously.  I would not use digoxin given her elevated creatinine.  I would not use amiodarone given her presumed history of amiodarone-induced toxicity.  She is on IV heparin.  D-dimer was elevated at the time of admission which may be a nonspecific finding in the context of non-STEMI and acute systolic heart failure.  However, lower extremity Dopplers are being performed as she had been off of anticoagulation.  Kate Sable, MD, [facility]  05/10/2017 9:36 AM

## 2017-05-10 NOTE — Telephone Encounter (Signed)
Called patient. No answer. Left message to call back.  

## 2017-05-10 NOTE — Progress Notes (Signed)
SATURATION QUALIFICATIONS: (This note is used to comply with regulatory documentation for home oxygen)  Patient Saturations on Room Air at Rest = 98%  Patient Saturations on Room Air while Ambulating = 94%  Patient Saturations on 2 Liters of oxygen while Ambulating = 99%  Please briefly explain why patient needs home oxygen:  No need for O2 therapy noted at this time

## 2017-05-10 NOTE — Progress Notes (Signed)
PROGRESS NOTE    ROQUEL BURGIN   KGM:[id number]  DOB: [birth year]  DOA: 05/09/2017 PCP: Celene Squibb, MD   HPI:   [name] is a 61 y.o. female with medical history of chronic dCHf (grade 2 on 6/18), persistent A-fib with Amio induced pulmonary toxicity , gout, DM 2 who presents to the ER for mainly for episodes of dyspnea on exertion for 2 wks which have become worse. She is now barely able to walk 10 feet without feeling short of breath, faint and dizzy. She also complains of progressive pedal edema for 2 days and palpitations. She was placed on Metoprolol at the end of Sept for palpitations but she still notices palpitation when she moves or has episodes of feeling hot (when siting still). She states her PCP, Dr [place] Crane took her off of Xarelto in July which she had been taking for 3 yrs.  She states that she has been having episodes of "feeling hot" associated with shortness of breath and palpitations. She sometimes thinks they are panic attack and otherwise has no issues with anxiety or depression.  She has a mild cough which she states started this morning.      Subjective: Feels less short of breath when walking today. Cough is mild.  ROS: no complaints of nausea, vomiting, constipation diarrhea,  or dysuria. No other complaints.   Assessment & Plan:  Principal Problem:   Atrial tachycardia - paroxsymal SVT- NSVT - 11/14> HR in going up to 160s, short runs of V tach - started Cardizem infusion which helped rate improve- replaced electrolytes - K and Mg were low normal - Cardizem DC'd today and Metoprolol increased - Xarelto discontinued in July/ Aug of this year due to GI bleed and IDA- EGD was unrevealing and capsule study was being considered-- stool hemoccult ordered- cont Heparin infusion in the meantime (see below in regards to GI issues)  Active Problems: Dyspnea on exertion- Acute systolic and diastolic CHF, right heart failure - ambulatory pulse ox 89% in ER and  94% today- no need for further work up - previous ECHO showed normal EF and grade 2dCHF (6/18) - 11/4 2 D ECHO:  Mild LVH with LVEF approximately 25% and akinesis of the mid to basal inferior/inferolateral wall. Indeterminate diastolic function. LVEF has decreased compared to the prior study from June. Severe left atrial enlargement. Mild mitral annular calcification with mild mitral regurgitation. Moderate RV dysfunction. Mild to moderate tricuspid regurgitation with PASP estimated 45 mmHg. - continue diuretics per cardiology- will need a cardiac cath to further evaluate WMA and drop in EF  Elevated troponin - mild with flat trend- likely due to above-   Elevated D Dimer - may be non-specific- LE duplex is negative and pulse ox has improved with diuresis  GI bleeding - Colonoscopy 6/17- > medium sized non-bleeding internal hemorrhoids, diverticulosis - underwent banding of hemmorhoids but continued to have occasional fresh blood on clothing - EGD- 8/18- normal - cont Iron- f/u hemoccult  Hypotension - cont Metoprolol - holding Losartan    Varicose veins of bilateral lower extremities with other complications - R > L pedal edema    DM (diabetes mellitus)  - hold Glipizide and place on ISS    CKD (chronic kidney disease), stage III -stable    OSA (obstructive sleep apnea) - CPAP at bedtime    ILD (interstitial lung disease)    IDA - cont Iron- Hb down to 9.5 in July but now 12.6  GERD -  Protonix  HLD - statin     DVT prophylaxis: Heparin infusion Code Status: Full  Family Communication:  Disposition Plan: home when stable Consultants:   cardiology Procedures:   2 D ECHO Antimicrobials:  Anti-infectives (From admission, onward)   None       Objective: Vitals:   05/10/17 0800 05/10/17 0805 05/10/17 0900 05/10/17 1056  BP: (!) 120/104  103/82   Pulse: 87 (!) 120 76 (!) 41  Resp: (!) 24 (!) 22 (!) 23 (!) 21  Temp:    97.8 F  (36.6 C)  TempSrc:    Oral  SpO2: 99% 100% 96% 92%  Weight:      Height:        Intake/Output Summary (Last 24 hours) at 05/10/2017 1238 Last data filed at 05/10/2017 1229 Gross per 24 hour  Intake 963 ml  Output 2550 ml  Net -1587 ml   Filed Weights   05/09/17 1010 05/09/17 1353 05/10/17 0500  Weight: 66.2 kg (146 lb) 67.7 kg (149 lb 4 oz) 66.7 kg (147 lb 0.8 oz)    Examination: General exam: Appears comfortable  HEENT: PERRLA, oral mucosa moist, no sclera icterus or thrush Respiratory system: mild upper airway congestion, lungs clear to auscultation, Respiratory effort normal. Cardiovascular system: S1 & S2 heard, IIRR.    Gastrointestinal system: Abdomen soft, non-tender, nondistended. Normal bowel sound. No organomegaly Central nervous system: Alert and oriented. No focal neurological deficits. Extremities: No cyanosis, clubbing or edema- varicose veins- mild pedal edema  Skin: No rashes or ulcers Psychiatry:  Mood & affect appropriate.     Data Reviewed: I have personally reviewed following labs and imaging studies  CBC: Recent Labs  Lab 05/09/17 1010 05/10/17 0424  WBC 9.7 8.9  NEUTROABS 6.0  --   HGB 13.1 12.6  HCT 40.9 39.5  MCV 92.3 92.5  PLT 205 233   Basic Metabolic Panel: Recent Labs  Lab 05/09/17 1010 05/09/17 1511 05/10/17 0424  NA 133*  --  139  K 3.5  --  4.2  CL 100*  --  104  CO2 21*  --  25  GLUCOSE 194*  --  111*  BUN 33*  --  32*  CREATININE 1.68*  --  1.69*  CALCIUM 9.1  --  9.3  MG  --  1.7 2.0   GFR: Estimated Creatinine Clearance: 28.3 mL/min (A) (by C-G formula based on SCr of 1.69 mg/dL (H)). Liver Function Tests: Recent Labs  Lab 05/09/17 1010  AST 53*  ALT 55*  ALKPHOS 105  BILITOT 0.8  PROT 7.1  ALBUMIN 3.7   No results for input(s): LIPASE, AMYLASE in the last 168 hours. No results for input(s): AMMONIA in the last 168 hours. Coagulation Profile: Recent Labs  Lab 05/09/17 1011  INR 1.20   Cardiac  Enzymes: Recent Labs  Lab 05/09/17 1010 05/09/17 1511 05/09/17 2104 05/10/17 0424  TROPONINI 0.42* 0.37* 0.35* 0.33*   BNP (last 3 results) Recent Labs    12/04/16 1111  PROBNP 376.0*   HbA1C: No results for input(s): HGBA1C in the last 72 hours. CBG: Recent Labs  Lab 05/09/17 1623 05/09/17 2109 05/10/17 0720 05/10/17 1052  GLUCAP 91 105* 116* 140*   Lipid Profile: No results for input(s): CHOL, HDL, LDLCALC, TRIG, CHOLHDL, LDLDIRECT in the last 72 hours. Thyroid Function Tests: Recent Labs    05/09/17 1010  TSH 1.676   Anemia Panel: No results for input(s): VITAMINB12, FOLATE, FERRITIN, TIBC, IRON, RETICCTPCT in the last 72  hours. Urine analysis:    Component Value Date/Time   COLORURINE YELLOW 01/05/2017 [place] 01/05/2017 1445   LABSPEC 1.010 01/05/2017 1445   PHURINE 8.0 01/05/2017 1445   GLUCOSEU NEGATIVE 01/05/2017 1445   HGBUR NEGATIVE 01/05/2017 1445   BILIRUBINUR NEGATIVE 01/05/2017 1445   KETONESUR NEGATIVE 01/05/2017 1445   PROTEINUR 30 (A) 01/05/2017 1445   UROBILINOGEN 0.2 11/06/2013 2335   NITRITE NEGATIVE 01/05/2017 1445   LEUKOCYTESUR NEGATIVE 01/05/2017 1445   Sepsis Labs: @LABRCNTIP (procalcitonin:4,lacticidven:4) ) Recent Results (from the past 240 hour(s))  MRSA PCR Screening     Status: None   Collection Time: 05/09/17  6:10 PM  Result Value Ref Range Status   MRSA by PCR NEGATIVE NEGATIVE Final    Comment:        The GeneXpert MRSA Assay (FDA approved for NASAL specimens only), is one component of a comprehensive MRSA colonization surveillance program. It is not intended to diagnose MRSA infection nor to guide or monitor treatment for MRSA infections.          Radiology Studies: Dg Chest 2 View  Result Date: 05/09/2017 CLINICAL DATA:  Cough and chest congestion with shortness of breath over the past 2 days. History of congenital heart disease, chronic bronchitis, diabetes, never smoked. EXAM: CHEST   2 VIEW COMPARISON:  Chest x-ray of February 23, 2017 and chest CT scan of March 08, 2017. FINDINGS: The lungs are slightly less well inflated today especially on the right. The interstitial markings are coarse and more conspicuous today. The cardiac silhouette is enlarged. The pulmonary vascularity is engorged similar to that seen previously. There is calcification in the wall of the aortic arch and tortuosity of the descending thoracic aorta. There surgical clips in the upper paratracheal region. There is a small right pleural effusion. IMPRESSION: Chronic interstitial changes bilaterally and pulmonary vascular congestion accentuated by mild hypoinflation. Stable enlargement of cardiac silhouette. Small right pleural effusion, stable. Thoracic aortic atherosclerosis. Electronically Signed   By: David  Martinique M.D.   On: 05/09/2017 10:36   US Venous Img Lower Bilateral  Result Date: 05/10/2017 CLINICAL DATA:  Bilateral lower extremity edema with occasional shortness of breath. Elevated D-dimer. Evaluate for DVT. EXAM: BILATERAL LOWER EXTREMITY VENOUS DOPPLER ULTRASOUND TECHNIQUE: Gray-scale sonography with graded compression, as well as color Doppler and duplex ultrasound were performed to evaluate the lower extremity deep venous systems from the level of the common femoral vein and including the common femoral, femoral, profunda femoral, popliteal and calf veins including the posterior tibial, peroneal and gastrocnemius veins when visible. The superficial great saphenous vein was also interrogated. Spectral Doppler was utilized to evaluate flow at rest and with distal augmentation maneuvers in the common femoral, femoral and popliteal veins. COMPARISON:  None. FINDINGS: RIGHT LOWER EXTREMITY Common Femoral Vein: No evidence of thrombus. Normal compressibility, respiratory phasicity and response to augmentation. Saphenofemoral Junction: No evidence of thrombus. Normal compressibility and flow on color Doppler  imaging. Profunda Femoral Vein: No evidence of thrombus. Normal compressibility and flow on color Doppler imaging. Femoral Vein: No evidence of thrombus. Normal compressibility, respiratory phasicity and response to augmentation. Popliteal Vein: No evidence of thrombus. Normal compressibility, respiratory phasicity and response to augmentation. Calf Veins: No evidence of thrombus. Normal compressibility and flow on color Doppler imaging. Superficial Great Saphenous Vein: No evidence of thrombus. Normal compressibility. Venous Reflux:  None. Other Findings:  None. LEFT LOWER EXTREMITY Common Femoral Vein: No evidence of thrombus. Normal compressibility, respiratory phasicity and response to augmentation.  Saphenofemoral Junction: No evidence of thrombus. Normal compressibility and flow on color Doppler imaging. Profunda Femoral Vein: No evidence of thrombus. Normal compressibility and flow on color Doppler imaging. Femoral Vein: No evidence of thrombus. Normal compressibility, respiratory phasicity and response to augmentation. Popliteal Vein: No evidence of thrombus. Normal compressibility, respiratory phasicity and response to augmentation. Calf Veins: No evidence of thrombus. Normal compressibility and flow on color Doppler imaging. Superficial Great Saphenous Vein: No evidence of thrombus. Normal compressibility. Venous Reflux:  None. Other Findings:  None. IMPRESSION: No evidence of DVT within either lower extremity. Electronically Signed   By: Sandi Mariscal M.D.   On: 05/10/2017 09:57      Scheduled Meds: . allopurinol  100 mg Oral BID  . aspirin EC  81 mg Oral Daily  . atorvastatin  40 mg Oral q1800  . calcium carbonate  1,250 mg Oral BID WC  . cholecalciferol  1,000 Units Oral Daily  . ferrous sulfate  325 mg Oral Q breakfast  . furosemide  40 mg Intravenous BID  . gabapentin  100 mg Oral TID  . insulin aspart  0-5 Units Subcutaneous QHS  . insulin aspart  0-9 Units Subcutaneous TID WC  .  levothyroxine  125 mcg Oral QAC breakfast  . magnesium oxide  400 mg Oral Daily  . mouth rinse  15 mL Mouth Rinse BID  . metoprolol succinate  50 mg Oral Daily  . pantoprazole  40 mg Oral BID  . sodium chloride flush  3 mL Intravenous Q12H   Continuous Infusions: . sodium chloride    . heparin 1,000 Units/hr (05/10/17 0834)     LOS: 1 day    Time spent in minutes: 35    Debbe Odea, MD [facility] Pager: [email or web address] Password [facility] 05/10/2017, 12:38 PM

## 2017-05-11 DIAGNOSIS — D509 Iron deficiency anemia, unspecified: Secondary | ICD-10-CM

## 2017-05-11 DIAGNOSIS — I428 Other cardiomyopathies: Secondary | ICD-10-CM

## 2017-05-11 DIAGNOSIS — I519 Heart disease, unspecified: Secondary | ICD-10-CM

## 2017-05-11 DIAGNOSIS — I5021 Acute systolic (congestive) heart failure: Secondary | ICD-10-CM | POA: Insufficient documentation

## 2017-05-11 DIAGNOSIS — I214 Non-ST elevation (NSTEMI) myocardial infarction: Secondary | ICD-10-CM | POA: Insufficient documentation

## 2017-05-11 LAB — CBC
HEMATOCRIT: 40.2 % (ref 36.0–46.0)
HEMOGLOBIN: 13 g/dL (ref 12.0–15.0)
MCH: 30 pg (ref 26.0–34.0)
MCHC: 32.3 g/dL (ref 30.0–36.0)
MCV: 92.8 fL (ref 78.0–100.0)
Platelets: 224 10*3/uL (ref 150–400)
RBC: 4.33 MIL/uL (ref 3.87–5.11)
RDW: 17.1 % — ABNORMAL HIGH (ref 11.5–15.5)
WBC: 8.7 10*3/uL (ref 4.0–10.5)

## 2017-05-11 LAB — BASIC METABOLIC PANEL
Anion gap: 11 (ref 5–15)
BUN: 33 mg/dL — AB (ref 6–20)
CHLORIDE: 99 mmol/L — AB (ref 101–111)
CO2: 26 mmol/L (ref 22–32)
Calcium: 9.9 mg/dL (ref 8.9–10.3)
Creatinine, Ser: 1.65 mg/dL — ABNORMAL HIGH (ref 0.44–1.00)
GFR calc Af Amer: 38 mL/min — ABNORMAL LOW (ref >60)
GFR calc non Af Amer: 33 mL/min — ABNORMAL LOW (ref >60)
GLUCOSE: 102 mg/dL — AB (ref 65–99)
POTASSIUM: 3.8 mmol/L (ref 3.5–5.1)
Sodium: 136 mmol/L (ref 135–145)

## 2017-05-11 LAB — GLUCOSE, CAPILLARY
GLUCOSE-CAPILLARY: 104 mg/dL — AB (ref 65–99)
GLUCOSE-CAPILLARY: 135 mg/dL — AB (ref 65–99)
GLUCOSE-CAPILLARY: 118 mg/dL — AB (ref 65–99)
Glucose-Capillary: 123 mg/dL — ABNORMAL HIGH (ref 65–99)

## 2017-05-11 LAB — HEPARIN LEVEL (UNFRACTIONATED): Heparin Unfractionated: 0.49 IU/mL (ref 0.30–0.70)

## 2017-05-11 LAB — HIV ANTIBODY (ROUTINE TESTING W REFLEX): HIV SCREEN 4TH GENERATION: NONREACTIVE

## 2017-05-11 MED ORDER — DIGOXIN 0.25 MG/ML IJ SOLN
0.5000 mg | Freq: Once | INTRAMUSCULAR | Status: AC
  Administered 2017-05-11: 0.5 mg via INTRAVENOUS
  Filled 2017-05-11: qty 2

## 2017-05-11 MED ORDER — METOPROLOL SUCCINATE ER 50 MG PO TB24
75.0000 mg | ORAL_TABLET | Freq: Every day | ORAL | Status: DC
  Administered 2017-05-11: 10:00:00 25 mg via ORAL
  Administered 2017-05-12 – 2017-05-14 (×3): 75 mg via ORAL
  Filled 2017-05-11 (×4): qty 1

## 2017-05-11 MED ORDER — ACETAMINOPHEN 325 MG PO TABS
650.0000 mg | ORAL_TABLET | Freq: Four times a day (QID) | ORAL | Status: DC | PRN
  Administered 2017-05-11 – 2017-05-13 (×5): 650 mg via ORAL
  Filled 2017-05-11 (×5): qty 2

## 2017-05-11 MED ORDER — POTASSIUM CHLORIDE CRYS ER 10 MEQ PO TBCR
10.0000 meq | EXTENDED_RELEASE_TABLET | Freq: Every day | ORAL | Status: DC
  Administered 2017-05-11 – 2017-05-17 (×7): 10 meq via ORAL
  Filled 2017-05-11 (×7): qty 1

## 2017-05-11 NOTE — Progress Notes (Addendum)
[place] for Heparin Indication: chest pain/ACS  Allergies  Allergen Reactions  . Flecainide Nausea Only and Other (See Comments)    Faint feeling  . Hydrocodone-Acetaminophen Nausea Only and Other (See Comments)    Severe headache  . Ibuprofen Other (See Comments)    Kidney dysfunction  . Oxycodone Hcl Nausea Only and Other (See Comments)    Headache  . Penicillins Nausea Only and Other (See Comments)    Severe headache Has patient had a PCN reaction causing immediate rash, facial/tongue/throat swelling, SOB or lightheadedness with hypotension: No Has patient had a PCN reaction causing severe rash involving mucus membranes or skin necrosis: No Has patient had a PCN reaction that required hospitalization: No Has patient had a PCN reaction occurring within the last 10 years: No If all of the above answers are "NO", then may proceed with Cephalosporin use.     Patient Measurements: Height: 4\' 10"  (147.3 cm) Weight: 143 lb 8.3 oz (65.1 kg) IBW/kg (Calculated) : 40.9 HEPARIN DW (KG): 56.1   Vital Signs: Temp: 98 F (36.7 C) (11/16 0400) Temp Source: Axillary (11/16 0400) BP: 108/46 (11/16 0600) Pulse Rate: 161 (11/16 0600)  Labs: Recent Labs    05/09/17 1010 05/09/17 1011 05/09/17 1511  05/09/17 2104 05/10/17 0424 05/10/17 1429 05/11/17 0356  HGB 13.1  --   --   --   --  12.6  --  13.0  HCT 40.9  --   --   --   --  39.5  --  40.2  PLT 205  --   --   --   --  219  --  224  APTT  --  31  --   --   --   --   --   --   LABPROT  --  15.1  --   --   --   --   --   --   INR  --  1.20  --   --   --   --   --   --   HEPARINUNFRC  --   --   --    < > 0.13* 0.23* 0.38 0.49  CREATININE 1.68*  --   --   --   --  1.69*  --  1.65*  TROPONINI 0.42*  --  0.37*  --  0.35* 0.33*  --   --    < > = values in this interval not displayed.    Estimated Creatinine Clearance: 28.6 mL/min (A) (by C-G formula based on SCr of 1.65 mg/dL  (H)).  Assessment: 61 yo lady on heparin for CP. Heparin level below goal this AM.  No bleeding noted, CBC stable.  Afib and Elevated troponin: Troponins remain elevated but trending down.  Demand ischemia versus an evolving non-STEMI.  Heparin is therapeutic .  Goal of Therapy:  Heparin level 0.3-0.7 units/ml Monitor platelets by anticoagulation protocol: Yes   Plan:  Continue heparin drip at 1000 units/hr heparin level daily Daily heparin level and CBC Monitor for bleeding complications  Isac Sarna, BS [place], BCPS Clinical Pharmacist Pager [phone] 05/11/2017,7:52 AM

## 2017-05-11 NOTE — Progress Notes (Signed)
PROGRESS NOTE    [name]   UMP:[id number]  DOB: Oct 24, 1955  DOA: 05/09/2017 PCP: Celene Squibb, MD   HPI:   [name] is a 61 y.o. female with medical history of chronic dCHf (grade 2 on 6/18), persistent A-fib with Amio induced pulmonary toxicity , gout, DM 2 who presents to the ER for mainly for episodes of dyspnea on exertion for 2 wks which have become worse. She is now barely able to walk 10 feet without feeling short of breath, faint and dizzy. She also complains of progressive pedal edema for 2 days and palpitations. She was placed on Metoprolol at the end of Sept for palpitations but she still notices palpitation when she moves or has episodes of feeling hot (when siting still). She states her PCP, Dr Nevada Crane took her off of Xarelto in July which she had been taking for 3 yrs.  She states that she has been having episodes of "feeling hot" associated with shortness of breath and palpitations. She sometimes thinks they are panic attack and otherwise has no issues with anxiety or depression.  She has a mild cough which she states started this morning.      Subjective: Congested cough with clear sputum- no severe. No dyspnea, palpitations or chest pain. ROS: no complaints of nausea, vomiting, constipation diarrhea or dysuria. No other complaints.   Assessment & Plan:  Principal Problem:   Atrial tachycardia - paroxsymal SVT- NSVT - 11/14> HR in going up to 160s, short runs of V tach - started Cardizem infusion which helped rate improve- replaced electrolytes - K and Mg were low normal - 11/15 Cardizem DC'd and Metoprolol increased- will increase Metoprolol again today - Xarelto discontinued in July/ Aug of this year due to GI bleed and IDA- EGD was unrevealing and capsule study was being considered- - stool hemoccult negative on 11/15 - on Heparin infusion which I will continue as she will need cath  Active Problems: Dyspnea on exertion- Acute systolic and diastolic  CHF, right heart failure - ambulatory pulse ox 89% in ER - improved to 94% on 11/15 - no need for further work up - previous ECHO showed normal EF and grade 2dCHF (6/18) - 11/4 2 D ECHO:  Mild LVH with LVEF approximately 25% and akinesis of the mid to basal inferior/inferolateral wall. Indeterminate diastolic function. LVEF has decreased compared to the prior study from June. Severe left atrial enlargement. Mild mitral annular calcification with mild mitral regurgitation. Moderate RV dysfunction. Mild to moderate tricuspid regurgitation with PASP estimated 45 mmHg.  - will need a cardiac cath to further evaluate WMA and drop in EF - hold Lasix for now- BP dropping & Pulse ox improved   Elevated troponin - mild with flat trend- likely due to above-   Elevated D Dimer - may be non-specific- LE duplex is negative and pulse ox has improved with diuresis  GI bleeding - Colonoscopy 6/17- > medium sized non-bleeding internal hemorrhoids, diverticulosis - underwent banding of hemmorhoids but continued to have occasional fresh blood on clothing - EGD- 8/18- normal - cont Iron-  Hemoccult is negative  Hypotension - cont Metoprolol for SVT- holding Losartan    Varicose veins of bilateral lower extremities with other complications - R > L pedal edema    DM (diabetes mellitus)  - hold Glipizide and place on ISS    CKD (chronic kidney disease), stage III -stable    OSA (obstructive sleep apnea) - CPAP at bedtime  ILD (interstitial lung disease)    IDA - cont Iron- Hb down to 9.5 in July but now 12.6  GERD - Protonix  HLD - statin     DVT prophylaxis: Heparin infusion Code Status: Full  Family Communication:  Disposition Plan: home when stable Consultants:   cardiology Procedures:   2 D ECHO Antimicrobials:  Anti-infectives (From admission, onward)   None       Objective: Vitals:   05/11/17 0600 05/11/17 0700 05/11/17 0800 05/11/17 0814   BP: (!) 108/46 109/82 119/85   Pulse: (!) 161 95 (!) 119   Resp: 16 16 (!) 22   Temp:    98 F (36.7 C)  TempSrc:    Oral  SpO2: 100% 100% 100%   Weight:      Height:        Intake/Output Summary (Last 24 hours) at 05/11/2017 0902 Last data filed at 05/11/2017 0814 Gross per 24 hour  Intake 720 ml  Output 650 ml  Net 70 ml   Filed Weights   05/09/17 1353 05/10/17 0500 05/11/17 0500  Weight: 67.7 kg (149 lb 4 oz) 66.7 kg (147 lb 0.8 oz) 65.1 kg (143 lb 8.3 oz)    Examination: General exam: Appears comfortable  HEENT: PERRLA, oral mucosa moist, no sclera icterus or thrush Respiratory system: mild upper airway congestion, lungs clear to auscultation, Respiratory effort normal. Cardiovascular system: S1 & S2 heard, IIRR.    Gastrointestinal system: Abdomen soft, non-tender, nondistended. Normal bowel sound. No organomegaly Central nervous system: Alert and oriented. No focal neurological deficits. Extremities: No cyanosis, clubbing or edema- varicose veins- mild pedal edema  Skin: No rashes or ulcers Psychiatry:  Mood & affect appropriate.     Data Reviewed: I have personally reviewed following labs and imaging studies  CBC: Recent Labs  Lab 05/09/17 1010 05/10/17 0424 05/11/17 0356  WBC 9.7 8.9 8.7  NEUTROABS 6.0  --   --   HGB 13.1 12.6 13.0  HCT 40.9 39.5 40.2  MCV 92.3 92.5 92.8  PLT 205 219 315   Basic Metabolic Panel: Recent Labs  Lab 05/09/17 1010 05/09/17 1511 05/10/17 0424 05/11/17 0356  NA 133*  --  139 136  K 3.5  --  4.2 3.8  CL 100*  --  104 99*  CO2 21*  --  25 26  GLUCOSE 194*  --  111* 102*  BUN 33*  --  32* 33*  CREATININE 1.68*  --  1.69* 1.65*  CALCIUM 9.1  --  9.3 9.9  MG  --  1.7 2.0  --    GFR: Estimated Creatinine Clearance: 28.6 mL/min (A) (by C-G formula based on SCr of 1.65 mg/dL (H)). Liver Function Tests: Recent Labs  Lab 05/09/17 1010  AST 53*  ALT 55*  ALKPHOS 105  BILITOT 0.8  PROT 7.1  ALBUMIN 3.7   No  results for input(s): LIPASE, AMYLASE in the last 168 hours. No results for input(s): AMMONIA in the last 168 hours. Coagulation Profile: Recent Labs  Lab 05/09/17 1011  INR 1.20   Cardiac Enzymes: Recent Labs  Lab 05/09/17 1010 05/09/17 1511 05/09/17 2104 05/10/17 0424  TROPONINI 0.42* 0.37* 0.35* 0.33*   BNP (last 3 results) Recent Labs    12/04/16 1111  PROBNP 376.0*   HbA1C: No results for input(s): HGBA1C in the last 72 hours. CBG: Recent Labs  Lab 05/10/17 0720 05/10/17 1052 05/10/17 1559 05/10/17 2124 05/11/17 0753  GLUCAP 116* 140* 107* 95 104*  Lipid Profile: No results for input(s): CHOL, HDL, LDLCALC, TRIG, CHOLHDL, LDLDIRECT in the last 72 hours. Thyroid Function Tests: Recent Labs    05/09/17 1010  TSH 1.676   Anemia Panel: No results for input(s): VITAMINB12, FOLATE, FERRITIN, TIBC, IRON, RETICCTPCT in the last 72 hours. Urine analysis:    Component Value Date/Time   COLORURINE YELLOW 01/05/2017 [place] 01/05/2017 1445   LABSPEC 1.010 01/05/2017 1445   PHURINE 8.0 01/05/2017 1445   GLUCOSEU NEGATIVE 01/05/2017 1445   HGBUR NEGATIVE 01/05/2017 1445   BILIRUBINUR NEGATIVE 01/05/2017 1445   KETONESUR NEGATIVE 01/05/2017 1445   PROTEINUR 30 (A) 01/05/2017 1445   UROBILINOGEN 0.2 11/06/2013 2335   NITRITE NEGATIVE 01/05/2017 1445   LEUKOCYTESUR NEGATIVE 01/05/2017 1445   Sepsis Labs: @LABRCNTIP (procalcitonin:4,lacticidven:4) ) Recent Results (from the past 240 hour(s))  MRSA PCR Screening     Status: None   Collection Time: 05/09/17  6:10 PM  Result Value Ref Range Status   MRSA by PCR NEGATIVE NEGATIVE Final    Comment:        The GeneXpert MRSA Assay (FDA approved for NASAL specimens only), is one component of a comprehensive MRSA colonization surveillance program. It is not intended to diagnose MRSA infection nor to guide or monitor treatment for MRSA infections.          Radiology Studies: Dg Chest  2 View  Result Date: 05/09/2017 CLINICAL DATA:  Cough and chest congestion with shortness of breath over the past 2 days. History of congenital heart disease, chronic bronchitis, diabetes, never smoked. EXAM: CHEST  2 VIEW COMPARISON:  Chest x-ray of February 23, 2017 and chest CT scan of March 08, 2017. FINDINGS: The lungs are slightly less well inflated today especially on the right. The interstitial markings are coarse and more conspicuous today. The cardiac silhouette is enlarged. The pulmonary vascularity is engorged similar to that seen previously. There is calcification in the wall of the aortic arch and tortuosity of the descending thoracic aorta. There surgical clips in the upper paratracheal region. There is a small right pleural effusion. IMPRESSION: Chronic interstitial changes bilaterally and pulmonary vascular congestion accentuated by mild hypoinflation. Stable enlargement of cardiac silhouette. Small right pleural effusion, stable. Thoracic aortic atherosclerosis. Electronically Signed   By: David  Martinique M.D.   On: 05/09/2017 10:36   US Venous Img Lower Bilateral  Result Date: 05/10/2017 CLINICAL DATA:  Bilateral lower extremity edema with occasional shortness of breath. Elevated D-dimer. Evaluate for DVT. EXAM: BILATERAL LOWER EXTREMITY VENOUS DOPPLER ULTRASOUND TECHNIQUE: Gray-scale sonography with graded compression, as well as color Doppler and duplex ultrasound were performed to evaluate the lower extremity deep venous systems from the level of the common femoral vein and including the common femoral, femoral, profunda femoral, popliteal and calf veins including the posterior tibial, peroneal and gastrocnemius veins when visible. The superficial great saphenous vein was also interrogated. Spectral Doppler was utilized to evaluate flow at rest and with distal augmentation maneuvers in the common femoral, femoral and popliteal veins. COMPARISON:  None. FINDINGS: RIGHT LOWER EXTREMITY  Common Femoral Vein: No evidence of thrombus. Normal compressibility, respiratory phasicity and response to augmentation. Saphenofemoral Junction: No evidence of thrombus. Normal compressibility and flow on color Doppler imaging. Profunda Femoral Vein: No evidence of thrombus. Normal compressibility and flow on color Doppler imaging. Femoral Vein: No evidence of thrombus. Normal compressibility, respiratory phasicity and response to augmentation. Popliteal Vein: No evidence of thrombus. Normal compressibility, respiratory phasicity and response to augmentation. Calf Veins:  No evidence of thrombus. Normal compressibility and flow on color Doppler imaging. Superficial Great Saphenous Vein: No evidence of thrombus. Normal compressibility. Venous Reflux:  None. Other Findings:  None. LEFT LOWER EXTREMITY Common Femoral Vein: No evidence of thrombus. Normal compressibility, respiratory phasicity and response to augmentation. Saphenofemoral Junction: No evidence of thrombus. Normal compressibility and flow on color Doppler imaging. Profunda Femoral Vein: No evidence of thrombus. Normal compressibility and flow on color Doppler imaging. Femoral Vein: No evidence of thrombus. Normal compressibility, respiratory phasicity and response to augmentation. Popliteal Vein: No evidence of thrombus. Normal compressibility, respiratory phasicity and response to augmentation. Calf Veins: No evidence of thrombus. Normal compressibility and flow on color Doppler imaging. Superficial Great Saphenous Vein: No evidence of thrombus. Normal compressibility. Venous Reflux:  None. Other Findings:  None. IMPRESSION: No evidence of DVT within either lower extremity. Electronically Signed   By: Sandi Mariscal M.D.   On: 05/10/2017 09:57      Scheduled Meds: . allopurinol  100 mg Oral BID  . aspirin EC  81 mg Oral Daily  . atorvastatin  40 mg Oral q1800  . calcium carbonate  1,250 mg Oral BID WC  . cholecalciferol  1,000 Units Oral Daily    . ferrous sulfate  325 mg Oral Q breakfast  . gabapentin  100 mg Oral TID  . insulin aspart  0-5 Units Subcutaneous QHS  . insulin aspart  0-9 Units Subcutaneous TID WC  . levothyroxine  125 mcg Oral QAC breakfast  . magnesium oxide  400 mg Oral Daily  . mouth rinse  15 mL Mouth Rinse BID  . metoprolol succinate  75 mg Oral Daily  . pantoprazole  40 mg Oral BID  . potassium chloride  10 mEq Oral Daily  . sodium chloride flush  3 mL Intravenous Q12H   Continuous Infusions: . sodium chloride    . heparin 1,000 Units/hr (05/10/17 1531)     LOS: 2 days    Time spent in minutes: 35    Debbe Odea, MD [facility] Pager: [email or web address] Password [email or web address] 05/11/2017, 9:02 AM

## 2017-05-11 NOTE — Progress Notes (Addendum)
Progress Note  Patient Name: DARRYL BLUMENSTEIN Date of Encounter: 05/11/2017  Primary Cardiologist: Cristopher Peru, MD  Subjective   She is without complaint of chest pain or dyspnea.  Wore CPAP last night tolerated it and states that it fit much better than when she uses at home which she had to abandoned because it did not fit  Inpatient Medications    Scheduled Meds: . allopurinol  100 mg Oral BID  . aspirin EC  81 mg Oral Daily  . atorvastatin  40 mg Oral q1800  . calcium carbonate  1,250 mg Oral BID WC  . cholecalciferol  1,000 Units Oral Daily  . ferrous sulfate  325 mg Oral Q breakfast  . furosemide  40 mg Intravenous BID  . gabapentin  100 mg Oral TID  . insulin aspart  0-5 Units Subcutaneous QHS  . insulin aspart  0-9 Units Subcutaneous TID WC  . levothyroxine  125 mcg Oral QAC breakfast  . magnesium oxide  400 mg Oral Daily  . mouth rinse  15 mL Mouth Rinse BID  . metoprolol succinate  50 mg Oral Daily  . pantoprazole  40 mg Oral BID  . sodium chloride flush  3 mL Intravenous Q12H   Continuous Infusions: . sodium chloride    . heparin 1,000 Units/hr (05/10/17 1531)   PRN Meds: sodium chloride, albuterol, sodium chloride flush, temazepam   Vital Signs    Vitals:   05/11/17 0300 05/11/17 0400 05/11/17 0500 05/11/17 0600  BP: (!) 113/91 93/76 101/82 (!) 108/46  Pulse: (!) 59 99 97 (!) 161  Resp: (!) 24 16 16 16   Temp:  98 F (36.7 C)    TempSrc:  Axillary    SpO2: 92% 95% 97% 100%  Weight:   143 lb 8.3 oz (65.1 kg)   Height:        Intake/Output Summary (Last 24 hours) at 05/11/2017 0749 Last data filed at 05/11/2017 0531 Gross per 24 hour  Intake 1080 ml  Output 650 ml  Net 430 ml   Filed Weights   05/09/17 1353 05/10/17 0500 05/11/17 0500  Weight: 149 lb 4 oz (67.7 kg) 147 lb 0.8 oz (66.7 kg) 143 lb 8.3 oz (65.1 kg)    Telemetry    Atrial fibrillation, rates between 90 and 110 bpm.  Occasional PVCs personally Reviewed  Physical Exam    GEN: No acute distress.   Neck: No JVD Cardiac: IRRR, 1/6 murmurs, rubs, or gallops.  Respiratory:  Bibasilar crackles, no wheezing, no coughing GI: Soft, nontender, non-distended  MS: No edema; No deformity. Neuro:  Nonfocal  Psych: Normal affect   Labs    Chemistry Recent Labs  Lab 05/09/17 1010 05/10/17 0424 05/11/17 0356  NA 133* 139 136  K 3.5 4.2 3.8  CL 100* 104 99*  CO2 21* 25 26  GLUCOSE 194* 111* 102*  BUN 33* 32* 33*  CREATININE 1.68* 1.69* 1.65*  CALCIUM 9.1 9.3 9.9  PROT 7.1  --   --   ALBUMIN 3.7  --   --   AST 53*  --   --   ALT 55*  --   --   ALKPHOS 105  --   --   BILITOT 0.8  --   --   GFRNONAA 32* 32* 33*  GFRAA 37* 37* 38*  ANIONGAP 12 10 11      Hematology Recent Labs  Lab 05/09/17 1010 05/10/17 0424 05/11/17 0356  WBC 9.7 8.9 8.7  RBC 4.43  4.27 4.33  HGB 13.1 12.6 13.0  HCT 40.9 39.5 40.2  MCV 92.3 92.5 92.8  MCH 29.6 29.5 30.0  MCHC 32.0 31.9 32.3  RDW 17.2* 17.2* 17.1*  PLT 205 219 224    Cardiac Enzymes Recent Labs  Lab 05/09/17 1010 05/09/17 1511 05/09/17 2104 05/10/17 0424  TROPONINI 0.42* 0.37* 0.35* 0.33*   No results for input(s): TROPIPOC in the last 168 hours.   BNP Recent Labs  Lab 05/09/17 1010  BNP 826.0*     DDimer  Recent Labs  Lab 05/09/17 1511  DDIMER 2.26*     Radiology    Dg Chest 2 View  Result Date: 05/09/2017 CLINICAL DATA:  Cough and chest congestion with shortness of breath over the past 2 days. History of congenital heart disease, chronic bronchitis, diabetes, never smoked. EXAM: CHEST  2 VIEW COMPARISON:  Chest x-ray of February 23, 2017 and chest CT scan of March 08, 2017. FINDINGS: The lungs are slightly less well inflated today especially on the right. The interstitial markings are coarse and more conspicuous today. The cardiac silhouette is enlarged. The pulmonary vascularity is engorged similar to that seen previously. There is calcification in the wall of the aortic arch and  tortuosity of the descending thoracic aorta. There surgical clips in the upper paratracheal region. There is a small right pleural effusion. IMPRESSION: Chronic interstitial changes bilaterally and pulmonary vascular congestion accentuated by mild hypoinflation. Stable enlargement of cardiac silhouette. Small right pleural effusion, stable. Thoracic aortic atherosclerosis. Electronically Signed   By: David  Martinique M.D.   On: 05/09/2017 10:36   US Venous Img Lower Bilateral  Result Date: 05/10/2017 CLINICAL DATA:  Bilateral lower extremity edema with occasional shortness of breath. Elevated D-dimer. Evaluate for DVT. EXAM: BILATERAL LOWER EXTREMITY VENOUS DOPPLER ULTRASOUND TECHNIQUE: Gray-scale sonography with graded compression, as well as color Doppler and duplex ultrasound were performed to evaluate the lower extremity deep venous systems from the level of the common femoral vein and including the common femoral, femoral, profunda femoral, popliteal and calf veins including the posterior tibial, peroneal and gastrocnemius veins when visible. The superficial great saphenous vein was also interrogated. Spectral Doppler was utilized to evaluate flow at rest and with distal augmentation maneuvers in the common femoral, femoral and popliteal veins. COMPARISON:  None. FINDINGS: RIGHT LOWER EXTREMITY Common Femoral Vein: No evidence of thrombus. Normal compressibility, respiratory phasicity and response to augmentation. Saphenofemoral Junction: No evidence of thrombus. Normal compressibility and flow on color Doppler imaging. Profunda Femoral Vein: No evidence of thrombus. Normal compressibility and flow on color Doppler imaging. Femoral Vein: No evidence of thrombus. Normal compressibility, respiratory phasicity and response to augmentation. Popliteal Vein: No evidence of thrombus. Normal compressibility, respiratory phasicity and response to augmentation. Calf Veins: No evidence of thrombus. Normal  compressibility and flow on color Doppler imaging. Superficial Great Saphenous Vein: No evidence of thrombus. Normal compressibility. Venous Reflux:  None. Other Findings:  None. LEFT LOWER EXTREMITY Common Femoral Vein: No evidence of thrombus. Normal compressibility, respiratory phasicity and response to augmentation. Saphenofemoral Junction: No evidence of thrombus. Normal compressibility and flow on color Doppler imaging. Profunda Femoral Vein: No evidence of thrombus. Normal compressibility and flow on color Doppler imaging. Femoral Vein: No evidence of thrombus. Normal compressibility, respiratory phasicity and response to augmentation. Popliteal Vein: No evidence of thrombus. Normal compressibility, respiratory phasicity and response to augmentation. Calf Veins: No evidence of thrombus. Normal compressibility and flow on color Doppler imaging. Superficial Great Saphenous Vein: No evidence of thrombus.  Normal compressibility. Venous Reflux:  None. Other Findings:  None. IMPRESSION: No evidence of DVT within either lower extremity. Electronically Signed   By: Sandi Mariscal M.D.   On: 05/10/2017 09:57    Cardiac Studies  Echocardiogram 05/09/2017 Left ventricle: The cavity size was normal. Wall thickness was   increased in a pattern of mild LVH. The estimated ejection   fraction was 25%. There is akinesis of the basal-midinferolateral   and inferior myocardium. The study is not technically sufficient   to allow evaluation of LV diastolic function. - Aortic valve: Mildly calcified annulus. Trileaflet. There was   trivial regurgitation. - Mitral valve: Mildly calcified annulus. Mildly thickened leaflets   . There was mild regurgitation. - Left atrium: The atrium was severely dilated. - Right ventricle: Systolic function was moderately reduced. - Right atrium: The atrium was mildly dilated. Central venous   pressure (est): 3 mm Hg. - Tricuspid valve: There was mild-moderate regurgitation. -  Pulmonary arteries: PA peak pressure: 45 mm Hg (S). - Pericardium, extracardiac: There was no pericardial effusion.  Venous Doppler Study IMPRESSION: No evidence of DVT within either lower extremity  Patient Profile     61 y.o. female w/ PMH of persistent atrial fibrillation (declines anticoagulation), amiodarone induced pulmonary toxicity, chronic diastolic heart failure, HTN, HLD, and Type 2 DMwho presented to [facility] ED on 05/09/2017 for evaluation of worsening dyspnea. Cardiology consulted for further evaluation.   Assessment & Plan    1.  Acute on chronic combined systolic and diastolic heart failure: The patient is now on Lasix 40 mg IV twice daily.  She is diuresed 1.9 L since admission, weight is 143 pounds, decreased from highest recorded weight of 149 pounds.  Creatinine 1.65 (compared to 1.69 yesterday).  Potassium 3.8.  We will need to replete in order to keep at least a level of 4.0 with reduced ejection fraction and possible CAD.  She remains on Toprol succinate, 50 mg daily.  2.  Elevated troponin: Trending downward.  Uncertain if this is demand ischemia in the setting of decompensated heart failure.  However, she is has a newly reduced ejection fraction of 25%, with wall motion abnormalities.  She continues on heparin.  Discussion of transfer for cardiac catheterization once heart failure has been resolved and she is euvolemic.  Also creatinine remains elevated.  Will discuss with Dr. Bronson Ing timing of transfer to [facility].   3.  Hypertension: Low normal.  Optimal currently for reduced ejection fraction.  She is not on ACE or ARB currently, elevated creatinine at 1.6.  4.  Persistent atrial fibrillation: Heart rate slightly elevated overnight when she was assisted to the bathroom otherwise remains in the 90s-80s.  Due to reduced EF diltiazem was discontinued, and she now is on metoprolol XL 50 mg daily.  Doppler ultrasound to rule out DVT was found to be negative.  She apparently was on  Xarelto in the past but was discontinued in the setting of GI bleed.  5.  Hyperlipidemia: Remains on a tour 40 mg daily.  6.  Hypothyroidism: Remains on Synthroid  7.  Chronic kidney disease stage III: Baseline creatinine of 1.4-1.5.  Currently at 1.6.  Continuing on IV Lasix.  8.  Type 2 diabetes: Remains on insulin per sliding scale, followed by PCP team.  Signed, Phill Myron. West Pugh, ANP, AACC   05/11/2017, 7:49 AM    The patient was seen and examined, and I agree with the history, physical exam, assessment and plan as documented above,  with modifications as noted below.  She is feeling better with respect to shortness of breath and leg swelling. She became hypotensive and IM has held diuretics.   She remains tachycardic. BUN up to 33, creatinine down to 1.65. Toprol-XL increased to 75 mg by IM. I will provide one dose of IV digoxin to assist with rate control as prolonged tachycardia may precipitate worsening CHF (especially with diuretics on hold), but would not plan on continuing indefinitely given renal insufficiency.    Continue IV heparin for atrial fibrillation. LE dopplers negative for DVT.  She will eventually need coronary angiography given significant decline in LV systolic function with regional wall motion abnormalities suggestive of coronary heart disease. She is on metoprolol succinate, aspirin, and Lipitor.      Kate Sable, MD, [facility]  05/11/2017 10:01 AM

## 2017-05-12 LAB — GLUCOSE, CAPILLARY
GLUCOSE-CAPILLARY: 113 mg/dL — AB (ref 65–99)
Glucose-Capillary: 132 mg/dL — ABNORMAL HIGH (ref 65–99)
Glucose-Capillary: 111 mg/dL — ABNORMAL HIGH (ref 65–99)
Glucose-Capillary: 159 mg/dL — ABNORMAL HIGH (ref 65–99)

## 2017-05-12 LAB — CBC
HCT: 42.3 % (ref 36.0–46.0)
HEMOGLOBIN: 13.2 g/dL (ref 12.0–15.0)
MCH: 28.8 pg (ref 26.0–34.0)
MCHC: 31.2 g/dL (ref 30.0–36.0)
MCV: 92.2 fL (ref 78.0–100.0)
Platelets: 243 10*3/uL (ref 150–400)
RBC: 4.59 MIL/uL (ref 3.87–5.11)
RDW: 16.7 % — ABNORMAL HIGH (ref 11.5–15.5)
WBC: 9 10*3/uL (ref 4.0–10.5)

## 2017-05-12 LAB — BASIC METABOLIC PANEL
Anion gap: 11 (ref 5–15)
BUN: 37 mg/dL — ABNORMAL HIGH (ref 6–20)
CO2: 28 mmol/L (ref 22–32)
Calcium: 9.8 mg/dL (ref 8.9–10.3)
Chloride: 96 mmol/L — ABNORMAL LOW (ref 101–111)
Creatinine, Ser: 1.72 mg/dL — ABNORMAL HIGH (ref 0.44–1.00)
GFR calc Af Amer: 36 mL/min — ABNORMAL LOW (ref >60)
GFR, EST NON AFRICAN AMERICAN: 31 mL/min — AB (ref >60)
GLUCOSE: 108 mg/dL — AB (ref 65–99)
Potassium: 3.9 mmol/L (ref 3.5–5.1)
Sodium: 135 mmol/L (ref 135–145)

## 2017-05-12 LAB — HEPARIN LEVEL (UNFRACTIONATED): Heparin Unfractionated: 0.55 IU/mL (ref 0.30–0.70)

## 2017-05-12 MED ORDER — CYCLOBENZAPRINE HCL 10 MG PO TABS
5.0000 mg | ORAL_TABLET | Freq: Three times a day (TID) | ORAL | Status: DC
  Administered 2017-05-12 – 2017-05-13 (×4): 5 mg via ORAL
  Filled 2017-05-12 (×4): qty 1

## 2017-05-12 NOTE — Progress Notes (Signed)
Patient came off CPAP about 4:00 am . Requested to stay off CPAP .

## 2017-05-12 NOTE — Progress Notes (Signed)
[facility] for Heparin Indication: chest pain/ACS  Allergies  Allergen Reactions  . Flecainide Nausea Only and Other (See Comments)    Faint feeling  . Hydrocodone-Acetaminophen Nausea Only and Other (See Comments)    Severe headache  . Ibuprofen Other (See Comments)    Kidney dysfunction  . Oxycodone Hcl Nausea Only and Other (See Comments)    Headache  . Penicillins Nausea Only and Other (See Comments)    Severe headache Has patient had a PCN reaction causing immediate rash, facial/tongue/throat swelling, SOB or lightheadedness with hypotension: No Has patient had a PCN reaction causing severe rash involving mucus membranes or skin necrosis: No Has patient had a PCN reaction that required hospitalization: No Has patient had a PCN reaction occurring within the last 10 years: No If all of the above answers are "NO", then may proceed with Cephalosporin use.     Patient Measurements: Height: 4\' 10"  (147.3 cm) Weight: 143 lb 8.3 oz (65.1 kg) IBW/kg (Calculated) : 40.9 HEPARIN DW (KG): 56.1   Vital Signs: Temp: 98.7 F (37.1 C) (11/17 0800) Temp Source: Oral (11/17 0800) BP: 106/82 (11/17 0900) Pulse Rate: 68 (11/17 0900)  Labs: Recent Labs    05/09/17 1010 05/09/17 1011 05/09/17 1511  05/09/17 2104 05/10/17 0424 05/10/17 1429 05/11/17 0356 05/12/17 0420  HGB 13.1  --   --   --   --  12.6  --  13.0 13.2  HCT 40.9  --   --   --   --  39.5  --  40.2 42.3  PLT 205  --   --   --   --  219  --  224 243  APTT  --  31  --   --   --   --   --   --   --   LABPROT  --  15.1  --   --   --   --   --   --   --   INR  --  1.20  --   --   --   --   --   --   --   HEPARINUNFRC  --   --   --    < > 0.13* 0.23* 0.38 0.49 0.55  CREATININE 1.68*  --   --   --   --  1.69*  --  1.65*  --   TROPONINI 0.42*  --  0.37*  --  0.35* 0.33*  --   --   --    < > = values in this interval not displayed.    Estimated Creatinine Clearance: 28.6 mL/min (A)  (by C-G formula based on SCr of 1.65 mg/dL (H)).  Assessment: 61 yo lady on heparin for CP. Heparin level below goal this AM.  No bleeding noted, CBC stable.  Afib and Elevated troponin: Troponins remain elevated but trending down.  Demand ischemia versus an evolving non-STEMI.  Heparin remains therapeutic .  Goal of Therapy:  Heparin level 0.3-0.7 units/ml Monitor platelets by anticoagulation protocol: Yes   Plan:  Continue heparin drip at 1000 units/hr Daily heparin level and CBC Monitor for bleeding complications  Isac Sarna, BS Vena [place], BCPS Clinical Pharmacist Pager [phone] 05/12/2017,9:10 AM

## 2017-05-12 NOTE — Progress Notes (Signed)
PROGRESS NOTE    [name]   OIZ:[id number]  DOB: 03-02-1956  DOA: 05/09/2017 PCP: Celene Squibb, MD   HPI:   [name] is a 61 y.o. female with medical history of chronic dCHf (grade 2 on 6/18), persistent A-fib with Amio induced pulmonary toxicity , gout, DM 2 who presents to the ER for mainly for episodes of dyspnea on exertion for 2 wks which have become worse. She is now barely able to walk 10 feet without feeling short of breath, faint and dizzy. She also complains of progressive pedal edema for 2 days and palpitations. She was placed on Metoprolol at the end of Sept for palpitations but she still notices palpitation when she moves or has episodes of feeling hot (when siting still). She states her PCP, Dr Nevada Crane took her off of Xarelto in July which she had been taking for 3 yrs.  She states that she has been having episodes of "feeling hot" associated with shortness of breath and palpitations. She sometimes thinks they are panic attack and otherwise has no issues with anxiety or depression.  She has a mild cough which she states started this morning.      Subjective: Cough still present and unchanged. No dyspnea, palpitations or chest pain. ROS: no complaints of nausea, vomiting, constipation diarrhea or dysuria. No other complaints.   Assessment & Plan:  Principal Problem:   Atrial tachycardia - paroxsymal SVT- NSVT - 11/14> HR in going up to 160s, short runs of V tach - started Cardizem infusion which helped rate improve- replaced electrolytes - K and Mg were low normal - 11/15 Cardizem DC'd and Metoprolol increased-  increase Metoprolol again to 75 on 11/16 and a dose of Dig given by Cardiology- HR has  Improved and on ambulation rate was 100- 112 today - Xarelto discontinued in July/ Aug of this year due to GI bleed and IDA- EGD was unrevealing and capsule study was being considered- - stool hemoccult negative on 11/15- will check again - on Heparin infusion which I  will continue as she will need cath  Active Problems: Dyspnea on exertion- Acute systolic and diastolic CHF, right heart failure - ambulatory pulse ox 89% in ER - improved to 94% on 11/15 - no need for further work up - previous ECHO showed normal EF and grade 2dCHF (6/18) - 11/4 2 D ECHO:  Mild LVH with LVEF approximately 25% and akinesis of the mid to basal inferior/inferolateral wall. Indeterminate diastolic function. LVEF has decreased compared to the prior study from June. Severe left atrial enlargement. Mild mitral annular calcification with mild mitral regurgitation. Moderate RV dysfunction. Mild to moderate tricuspid regurgitation with PASP estimated 45 mmHg.  - will need a cardiac cath to further evaluate WMA and drop in EF - hold Lasix for now- BP dropping & Pulse ox improved  - follow daily weights   Elevated troponin - mild with flat trend- likely due to above-   Elevated D Dimer - may be non-specific- LE duplex is negative and pulse ox has improved with diuresis  GI bleeding - Colonoscopy 6/17- > medium sized non-bleeding internal hemorrhoids, diverticulosis - underwent banding of hemmorhoids but continued to have occasional fresh blood on clothing - EGD- 8/18- normal - cont Iron-  Hemoccult is negative  Hypotension - cont Metoprolol for SVT- holding Losartan    Varicose veins of bilateral lower extremities with other complications - R > L pedal edema    DM (diabetes mellitus)  -  hold Glipizide and place on ISS    CKD (chronic kidney disease), stage III -stable    OSA (obstructive sleep apnea) - CPAP at bedtime    ILD (interstitial lung disease)    IDA - cont Iron- Hb down to 9.5 in July but now 12.6  GERD - Protonix  HLD - statin     DVT prophylaxis: Heparin infusion Code Status: Full  Family Communication:  Disposition Plan: home when stable- transfer to telemetry today- awaiting cardiac cath Consultants:    cardiology Procedures:   2 D ECHO Antimicrobials:  Anti-infectives (From admission, onward)   None       Objective: Vitals:   05/12/17 0800 05/12/17 0900 05/12/17 1054 05/12/17 1100  BP: 115/72 106/82    Pulse: 88 68    Resp: 15     Temp: 98.7 F (37.1 C)   98.1 F (36.7 C)  TempSrc: Oral   Oral  SpO2: 92% 95% 97%   Weight:      Height:        Intake/Output Summary (Last 24 hours) at 05/12/2017 1249 Last data filed at 05/11/2017 1732 Gross per 24 hour  Intake 240 ml  Output 1 ml  Net 239 ml   Filed Weights   05/09/17 1353 05/10/17 0500 05/11/17 0500  Weight: 67.7 kg (149 lb 4 oz) 66.7 kg (147 lb 0.8 oz) 65.1 kg (143 lb 8.3 oz)    Examination: General exam: Appears comfortable  HEENT: PERRLA, oral mucosa moist, no sclera icterus or thrush Respiratory system:   lungs clear to auscultation, Respiratory effort normal. Pulse ox  97-99 % on RA Cardiovascular system: S1 & S2 heard, IIRR.   HR in 90s at rest Gastrointestinal system: Abdomen soft, non-tender, nondistended. Normal bowel sound. No organomegaly Central nervous system: Alert and oriented. No focal neurological deficits. Extremities: No cyanosis, clubbing or edema- varicose veins- mild pedal edema  Skin: No rashes or ulcers Psychiatry:  Mood & affect appropriate.     Data Reviewed: I have personally reviewed following labs and imaging studies  CBC: Recent Labs  Lab 05/09/17 1010 05/10/17 0424 05/11/17 0356 05/12/17 0420  WBC 9.7 8.9 8.7 9.0  NEUTROABS 6.0  --   --   --   HGB 13.1 12.6 13.0 13.2  HCT 40.9 39.5 40.2 42.3  MCV 92.3 92.5 92.8 92.2  PLT 205 219 224 101   Basic Metabolic Panel: Recent Labs  Lab 05/09/17 1010 05/09/17 1511 05/10/17 0424 05/11/17 0356 05/12/17 0420  NA 133*  --  139 136 135  K 3.5  --  4.2 3.8 3.9  CL 100*  --  104 99* 96*  CO2 21*  --  25 26 28   GLUCOSE 194*  --  111* 102* 108*  BUN 33*  --  32* 33* 37*  CREATININE 1.68*  --  1.69* 1.65* 1.72*  CALCIUM  9.1  --  9.3 9.9 9.8  MG  --  1.7 2.0  --   --    GFR: Estimated Creatinine Clearance: 27.4 mL/min (A) (by C-G formula based on SCr of 1.72 mg/dL (H)). Liver Function Tests: Recent Labs  Lab 05/09/17 1010  AST 53*  ALT 55*  ALKPHOS 105  BILITOT 0.8  PROT 7.1  ALBUMIN 3.7   No results for input(s): LIPASE, AMYLASE in the last 168 hours. No results for input(s): AMMONIA in the last 168 hours. Coagulation Profile: Recent Labs  Lab 05/09/17 1011  INR 1.20   Cardiac Enzymes: Recent Labs  Lab 05/09/17 1010 05/09/17 1511 05/09/17 2104 05/10/17 0424  TROPONINI 0.42* 0.37* 0.35* 0.33*   BNP (last 3 results) Recent Labs    12/04/16 1111  PROBNP 376.0*   HbA1C: No results for input(s): HGBA1C in the last 72 hours. CBG: Recent Labs  Lab 05/11/17 1106 05/11/17 1559 05/11/17 2143 05/12/17 0753 05/12/17 1151  GLUCAP 135* 118* 123* 113* 132*   Lipid Profile: No results for input(s): CHOL, HDL, LDLCALC, TRIG, CHOLHDL, LDLDIRECT in the last 72 hours. Thyroid Function Tests: No results for input(s): TSH, T4TOTAL, FREET4, T3FREE, THYROIDAB in the last 72 hours. Anemia Panel: No results for input(s): VITAMINB12, FOLATE, FERRITIN, TIBC, IRON, RETICCTPCT in the last 72 hours. Urine analysis:    Component Value Date/Time   COLORURINE YELLOW 01/05/2017 [place] 01/05/2017 1445   LABSPEC 1.010 01/05/2017 1445   PHURINE 8.0 01/05/2017 1445   GLUCOSEU NEGATIVE 01/05/2017 1445   HGBUR NEGATIVE 01/05/2017 1445   BILIRUBINUR NEGATIVE 01/05/2017 1445   KETONESUR NEGATIVE 01/05/2017 1445   PROTEINUR 30 (A) 01/05/2017 1445   UROBILINOGEN 0.2 11/06/2013 2335   NITRITE NEGATIVE 01/05/2017 1445   LEUKOCYTESUR NEGATIVE 01/05/2017 1445   Sepsis Labs: @LABRCNTIP (procalcitonin:4,lacticidven:4) ) Recent Results (from the past 240 hour(s))  MRSA PCR Screening     Status: None   Collection Time: 05/09/17  6:10 PM  Result Value Ref Range Status   MRSA by PCR  NEGATIVE NEGATIVE Final    Comment:        The GeneXpert MRSA Assay (FDA approved for NASAL specimens only), is one component of a comprehensive MRSA colonization surveillance program. It is not intended to diagnose MRSA infection nor to guide or monitor treatment for MRSA infections.          Radiology Studies: No results found.    Scheduled Meds: . allopurinol  100 mg Oral BID  . aspirin EC  81 mg Oral Daily  . atorvastatin  40 mg Oral q1800  . calcium carbonate  1,250 mg Oral BID WC  . cholecalciferol  1,000 Units Oral Daily  . cyclobenzaprine  5 mg Oral TID  . ferrous sulfate  325 mg Oral Q breakfast  . gabapentin  100 mg Oral TID  . insulin aspart  0-5 Units Subcutaneous QHS  . insulin aspart  0-9 Units Subcutaneous TID WC  . levothyroxine  125 mcg Oral QAC breakfast  . magnesium oxide  400 mg Oral Daily  . mouth rinse  15 mL Mouth Rinse BID  . metoprolol succinate  75 mg Oral Daily  . pantoprazole  40 mg Oral BID  . potassium chloride  10 mEq Oral Daily  . sodium chloride flush  3 mL Intravenous Q12H   Continuous Infusions: . sodium chloride    . heparin 1,000 Units/hr (05/11/17 1921)     LOS: 3 days    Time spent in minutes: 35    Debbe Odea, MD [facility] Pager: [email or web address] Password [email or web address] 05/12/2017, 12:49 PM

## 2017-05-12 NOTE — Progress Notes (Signed)
Patient ambulated around the nurses station with nursing staff.  Heart rate sustained between 100-112 while ambulating.  Assisted patient to the recliner after ambulation. Resting heart rate currently in the 90's.

## 2017-05-12 NOTE — Progress Notes (Signed)
Changed to nasal mask CPAP , Patient may be able tolerate all night. So far has only tolerated a few hours a night.

## 2017-05-13 DIAGNOSIS — M545 Low back pain: Secondary | ICD-10-CM

## 2017-05-13 LAB — BASIC METABOLIC PANEL
ANION GAP: 10 (ref 5–15)
BUN: 33 mg/dL — ABNORMAL HIGH (ref 6–20)
CHLORIDE: 101 mmol/L (ref 101–111)
CO2: 26 mmol/L (ref 22–32)
Calcium: 9.3 mg/dL (ref 8.9–10.3)
Creatinine, Ser: 1.41 mg/dL — ABNORMAL HIGH (ref 0.44–1.00)
GFR calc non Af Amer: 39 mL/min — ABNORMAL LOW (ref >60)
GFR, EST AFRICAN AMERICAN: 46 mL/min — AB (ref >60)
Glucose, Bld: 119 mg/dL — ABNORMAL HIGH (ref 65–99)
Potassium: 4.1 mmol/L (ref 3.5–5.1)
Sodium: 137 mmol/L (ref 135–145)

## 2017-05-13 LAB — GLUCOSE, CAPILLARY
GLUCOSE-CAPILLARY: 134 mg/dL — AB (ref 65–99)
Glucose-Capillary: 114 mg/dL — ABNORMAL HIGH (ref 65–99)
Glucose-Capillary: 129 mg/dL — ABNORMAL HIGH (ref 65–99)
Glucose-Capillary: 176 mg/dL — ABNORMAL HIGH (ref 65–99)

## 2017-05-13 LAB — CBC
HEMATOCRIT: 39.8 % (ref 36.0–46.0)
HEMOGLOBIN: 12.5 g/dL (ref 12.0–15.0)
MCH: 29.1 pg (ref 26.0–34.0)
MCHC: 31.4 g/dL (ref 30.0–36.0)
MCV: 92.8 fL (ref 78.0–100.0)
Platelets: 218 10*3/uL (ref 150–400)
RBC: 4.29 MIL/uL (ref 3.87–5.11)
RDW: 16.4 % — ABNORMAL HIGH (ref 11.5–15.5)
WBC: 8.9 10*3/uL (ref 4.0–10.5)

## 2017-05-13 LAB — HEPARIN LEVEL (UNFRACTIONATED): HEPARIN UNFRACTIONATED: 0.7 [IU]/mL (ref 0.30–0.70)

## 2017-05-13 LAB — OCCULT BLOOD X 1 CARD TO LAB, STOOL: Fecal Occult Bld: NEGATIVE

## 2017-05-13 MED ORDER — DICLOFENAC SODIUM 1 % TD GEL
2.0000 g | Freq: Four times a day (QID) | TRANSDERMAL | Status: DC
  Administered 2017-05-13 – 2017-05-17 (×14): 2 g via TOPICAL
  Filled 2017-05-13: qty 100

## 2017-05-13 MED ORDER — CYCLOBENZAPRINE HCL 5 MG PO TABS: 5.0000 mg | ORAL_TABLET | Freq: Three times a day (TID) | ORAL | Status: DC | PRN

## 2017-05-13 NOTE — Progress Notes (Signed)
PROGRESS NOTE    [name]   VOZ:[id number]  DOB: 04-09-1956  DOA: 05/09/2017 PCP: Celene Squibb, MD   HPI:   [name] is a 61 y.o. female with medical history of chronic dCHf (grade 2 on 6/18), persistent A-fib with Amio induced pulmonary toxicity , gout, DM 2 who presents to the ER for mainly for episodes of dyspnea on exertion for 2 wks which have become worse. She is now barely able to walk 10 feet without feeling short of breath, faint and dizzy. She also complains of progressive pedal edema for 2 days and palpitations. She was placed on Metoprolol at the end of Sept for palpitations but she still notices palpitation when she moves or has episodes of feeling hot (when siting still). She states her PCP, Dr Nevada Crane took her off of Xarelto in July which she had been taking for 3 yrs.  She states that she has been having episodes of "feeling hot" associated with shortness of breath and palpitations. She sometimes thinks they are panic attack and otherwise has no issues with anxiety or depression.  She has a mild cough which she states started this morning.      Subjective: Cough improved. Having occasional pain in flank radiating to abdomen. Pain is worse when she bends. Uses Flexeril at home. Pain today is in right flank. No dyspnea, palpitations or chest pain. Asking about when cath will be done.  ROS: no complaints of nausea, vomiting, constipation diarrhea or dysuria. No other complaints.   Assessment & Plan:  Principal Problem:   Atrial tachycardia - paroxsymal SVT- NSVT - 11/14> HR in going up to 160s, short runs of V tach - started Cardizem infusion which helped rate improve- replaced electrolytes - K and Mg were low normal - 11/15 Cardizem DC'd and Metoprolol increased-  increase Metoprolol again to 75 on 11/16 and a dose of Dig given by Cardiology- HR has  Improved and on ambulation rate was 100- 112 today - Xarelto discontinued in July/ Aug of this year due to GI  bleed and IDA- EGD was unrevealing and capsule study was being considered- - stool hemoccult negative on 11/15- will check again - on Heparin infusion which I will continue as she will need cath  Active Problems: Dyspnea on exertion- Acute systolic and diastolic CHF, right heart failure - ambulatory pulse ox 89% in ER - improved to 94% on 11/15 - no need for further work up - previous ECHO showed normal EF and grade 2dCHF (6/18) - 11/4 2 D ECHO:  Mild LVH with LVEF approximately 25% and akinesis of the mid to basal inferior/inferolateral wall. Indeterminate diastolic function. LVEF has decreased compared to the prior study from June. Severe left atrial enlargement. Mild mitral annular calcification with mild mitral regurgitation. Moderate RV dysfunction. Mild to moderate tricuspid regurgitation with PASP estimated 45 mmHg.  - will need a cardiac cath to further evaluate WMA and drop in EF - holding Lasix for now- BP dropping & Pulse ox improved  - follow daily weights   Elevated troponin - mild with flat trend- likely due to above-   Right lumbar pain - tender to touch - abdomen non tender- start Voltaren gel and encourage OOB - will need PT on discharge - PRN Flexeril  Elevated D Dimer - may be non-specific- LE duplex is negative and pulse ox has improved with diuresis  GI bleeding - Colonoscopy 6/17- > medium sized non-bleeding internal hemorrhoids, diverticulosis - underwent banding of  hemmorhoids but continued to have occasional fresh blood on clothing - EGD- 8/18- normal - cont Iron-  Hemoccult is negative  Hypotension - cont Metoprolol for SVT- holding Losartan    Varicose veins of bilateral lower extremities with other complications - R > L pedal edema    DM (diabetes mellitus)  - hold Glipizide - cont SS    CKD (chronic kidney disease), stage III -stable    OSA (obstructive sleep apnea) - CPAP at bedtime- discussed wearing it more than 3 hrs  a night- she no longer uses at home because the company picked it up due to not using is adequately    ILD (interstitial lung disease)    IDA - cont Iron- Hb down to 9.5 in July but now 12- 14  GERD - Protonix- is on it BID  HLD - statin   Gout - Allopurinol    DVT prophylaxis: Heparin infusion Code Status: Full  Family Communication:  Disposition Plan: home when stable- transfer to telemetry today- awaiting cardiac cath Consultants:   cardiology Procedures:   2 D ECHO Antimicrobials:  Anti-infectives (From admission, onward)   None       Objective: Vitals:   05/12/17 1954 05/12/17 2129 05/12/17 2259 05/13/17 0549  BP:  118/72  120/68  Pulse: 86 64 86 66  Resp: 20 20 16 20   Temp:  98 F (36.7 C)  98.2 F (36.8 C)  TempSrc:  Oral  Oral  SpO2: 96% 95% 94% 94%  Weight:      Height:        Intake/Output Summary (Last 24 hours) at 05/13/2017 1057 Last data filed at 05/13/2017 0557 Gross per 24 hour  Intake 955.65 ml  Output -  Net 955.65 ml   Filed Weights   05/10/17 0500 05/11/17 0500 05/12/17 1501  Weight: 66.7 kg (147 lb 0.8 oz) 65.1 kg (143 lb 8.3 oz) 64.7 kg (142 lb 11.2 oz)    Examination: General exam: Appears comfortable  HEENT: PERRLA, oral mucosa moist, no sclera icterus or thrush Respiratory system:   lungs clear to auscultation, Respiratory effort normal. Pulse ox  97-99 % on RA Cardiovascular system: S1 & S2 heard, IIRR.   HR in 80s at rest Gastrointestinal system: Abdomen soft, non-tender, nondistended. Normal bowel sound. No organomegaly Central nervous system: Alert and oriented. No focal neurological deficits. Extremities: No cyanosis, clubbing or edema- varicose veins- mild pedal edema  MSK: tender in right lumbar area Skin: No rashes or ulcers Psychiatry:  Mood & affect appropriate.     Data Reviewed: I have personally reviewed following labs and imaging studies  CBC: Recent Labs  Lab 05/09/17 1010 05/10/17 0424  05/11/17 0356 05/12/17 0420 05/13/17 0607  WBC 9.7 8.9 8.7 9.0 8.9  NEUTROABS 6.0  --   --   --   --   HGB 13.1 12.6 13.0 13.2 12.5  HCT 40.9 39.5 40.2 42.3 39.8  MCV 92.3 92.5 92.8 92.2 92.8  PLT 205 219 224 243 315   Basic Metabolic Panel: Recent Labs  Lab 05/09/17 1010 05/09/17 1511 05/10/17 0424 05/11/17 0356 05/12/17 0420 05/13/17 0607  NA 133*  --  139 136 135 137  K 3.5  --  4.2 3.8 3.9 4.1  CL 100*  --  104 99* 96* 101  CO2 21*  --  25 26 28 26   GLUCOSE 194*  --  111* 102* 108* 119*  BUN 33*  --  32* 33* 37* 33*  CREATININE 1.68*  --  1.69* 1.65* 1.72* 1.41*  CALCIUM 9.1  --  9.3 9.9 9.8 9.3  MG  --  1.7 2.0  --   --   --    GFR: Estimated Creatinine Clearance: 33.3 mL/min (A) (by C-G formula based on SCr of 1.41 mg/dL (H)). Liver Function Tests: Recent Labs  Lab 05/09/17 1010  AST 53*  ALT 55*  ALKPHOS 105  BILITOT 0.8  PROT 7.1  ALBUMIN 3.7   No results for input(s): LIPASE, AMYLASE in the last 168 hours. No results for input(s): AMMONIA in the last 168 hours. Coagulation Profile: Recent Labs  Lab 05/09/17 1011  INR 1.20   Cardiac Enzymes: Recent Labs  Lab 05/09/17 1010 05/09/17 1511 05/09/17 2104 05/10/17 0424  TROPONINI 0.42* 0.37* 0.35* 0.33*   BNP (last 3 results) Recent Labs    12/04/16 1111  PROBNP 376.0*   HbA1C: No results for input(s): HGBA1C in the last 72 hours. CBG: Recent Labs  Lab 05/12/17 0753 05/12/17 1151 05/12/17 1630 05/12/17 2154 05/13/17 0739  GLUCAP 113* 132* 111* 159* 114*   Lipid Profile: No results for input(s): CHOL, HDL, LDLCALC, TRIG, CHOLHDL, LDLDIRECT in the last 72 hours. Thyroid Function Tests: No results for input(s): TSH, T4TOTAL, FREET4, T3FREE, THYROIDAB in the last 72 hours. Anemia Panel: No results for input(s): VITAMINB12, FOLATE, FERRITIN, TIBC, IRON, RETICCTPCT in the last 72 hours. Urine analysis:    Component Value Date/Time   COLORURINE YELLOW 01/05/2017 [place] 01/05/2017 1445   LABSPEC 1.010 01/05/2017 1445   PHURINE 8.0 01/05/2017 1445   GLUCOSEU NEGATIVE 01/05/2017 1445   HGBUR NEGATIVE 01/05/2017 1445   BILIRUBINUR NEGATIVE 01/05/2017 1445   KETONESUR NEGATIVE 01/05/2017 1445   PROTEINUR 30 (A) 01/05/2017 1445   UROBILINOGEN 0.2 11/06/2013 2335   NITRITE NEGATIVE 01/05/2017 1445   LEUKOCYTESUR NEGATIVE 01/05/2017 1445   Sepsis Labs: @LABRCNTIP (procalcitonin:4,lacticidven:4) ) Recent Results (from the past 240 hour(s))  MRSA PCR Screening     Status: None   Collection Time: 05/09/17  6:10 PM  Result Value Ref Range Status   MRSA by PCR NEGATIVE NEGATIVE Final    Comment:        The GeneXpert MRSA Assay (FDA approved for NASAL specimens only), is one component of a comprehensive MRSA colonization surveillance program. It is not intended to diagnose MRSA infection nor to guide or monitor treatment for MRSA infections.          Radiology Studies: No results found.    Scheduled Meds: . allopurinol  100 mg Oral BID  . aspirin EC  81 mg Oral Daily  . atorvastatin  40 mg Oral q1800  . calcium carbonate  1,250 mg Oral BID WC  . cholecalciferol  1,000 Units Oral Daily  . cyclobenzaprine  5 mg Oral TID  . ferrous sulfate  325 mg Oral Q breakfast  . gabapentin  100 mg Oral TID  . insulin aspart  0-5 Units Subcutaneous QHS  . insulin aspart  0-9 Units Subcutaneous TID WC  . levothyroxine  125 mcg Oral QAC breakfast  . magnesium oxide  400 mg Oral Daily  . mouth rinse  15 mL Mouth Rinse BID  . metoprolol succinate  75 mg Oral Daily  . pantoprazole  40 mg Oral BID  . potassium chloride  10 mEq Oral Daily  . sodium chloride flush  3 mL Intravenous Q12H   Continuous Infusions: . sodium chloride    . heparin 950 Units/hr (05/13/17 0952)  LOS: 4 days    Time spent in minutes: 35    Debbe Odea, MD [facility] Pager: [email or web address] Password [email or web address] 05/13/2017, 10:57 AM

## 2017-05-13 NOTE — Progress Notes (Signed)
Pt states she is unable to ambulate in the hall r/t back pain.  She has ambulated in the room x2 today, approx 15 ft each time.  Pain is improving w/Voltaren.

## 2017-05-13 NOTE — Progress Notes (Signed)
[facility] for Heparin Indication: chest pain/ACS  Allergies  Allergen Reactions  . Flecainide Nausea Only and Other (See Comments)    Faint feeling  . Hydrocodone-Acetaminophen Nausea Only and Other (See Comments)    Severe headache  . Ibuprofen Other (See Comments)    Kidney dysfunction  . Oxycodone Hcl Nausea Only and Other (See Comments)    Headache  . Penicillins Nausea Only and Other (See Comments)    Severe headache Has patient had a PCN reaction causing immediate rash, facial/tongue/throat swelling, SOB or lightheadedness with hypotension: No Has patient had a PCN reaction causing severe rash involving mucus membranes or skin necrosis: No Has patient had a PCN reaction that required hospitalization: No Has patient had a PCN reaction occurring within the last 10 years: No If all of the above answers are "NO", then may proceed with Cephalosporin use.     Patient Measurements: Height: 4\' 10"  (147.3 cm) Weight: 142 lb 11.2 oz (64.7 kg) IBW/kg (Calculated) : 40.9 HEPARIN DW (KG): 55.2   Vital Signs: Temp: 98.2 F (36.8 C) (11/18 0549) Temp Source: Oral (11/18 0549) BP: 120/68 (11/18 0549) Pulse Rate: 66 (11/18 0549)  Labs: Recent Labs    05/11/17 0356 05/12/17 0420 05/13/17 0607  HGB 13.0 13.2 12.5  HCT 40.2 42.3 39.8  PLT 224 243 218  HEPARINUNFRC 0.49 0.55 0.70  CREATININE 1.65* 1.72* 1.41*    Estimated Creatinine Clearance: 33.3 mL/min (A) (by C-G formula based on SCr of 1.41 mg/dL (H)).  Assessment: 61 yo lady on heparin for CP. Heparin level below goal this AM.  No bleeding noted, CBC stable.  Afib and Elevated troponin: Troponins remain elevated but trending down.  Demand ischemia versus an evolving non-STEMI.  Heparin remains therapeutic  But on upper end of scale.  Goal of Therapy:  Heparin level 0.3-0.7 units/ml Monitor platelets by anticoagulation protocol: Yes   Plan:  Decrease heparin drip at 950  units/hr Daily heparin level and CBC Monitor for bleeding complications  Isac Sarna, BS Vena [place], BCPS Clinical Pharmacist Pager [phone] 05/13/2017,8:29 AM

## 2017-05-13 NOTE — Progress Notes (Signed)
Patient wore CPAP till about 1 am before pulling off.

## 2017-05-14 DIAGNOSIS — I5043 Acute on chronic combined systolic (congestive) and diastolic (congestive) heart failure: Secondary | ICD-10-CM

## 2017-05-14 LAB — BASIC METABOLIC PANEL
Anion gap: 10 (ref 5–15)
BUN: 29 mg/dL — AB (ref 6–20)
CALCIUM: 9.4 mg/dL (ref 8.9–10.3)
CO2: 26 mmol/L (ref 22–32)
Chloride: 102 mmol/L (ref 101–111)
Creatinine, Ser: 1.41 mg/dL — ABNORMAL HIGH (ref 0.44–1.00)
GFR calc Af Amer: 46 mL/min — ABNORMAL LOW (ref >60)
GFR, EST NON AFRICAN AMERICAN: 39 mL/min — AB (ref >60)
GLUCOSE: 125 mg/dL — AB (ref 65–99)
Potassium: 4.2 mmol/L (ref 3.5–5.1)
Sodium: 138 mmol/L (ref 135–145)

## 2017-05-14 LAB — GLUCOSE, CAPILLARY
GLUCOSE-CAPILLARY: 118 mg/dL — AB (ref 65–99)
GLUCOSE-CAPILLARY: 180 mg/dL — AB (ref 65–99)
GLUCOSE-CAPILLARY: 99 mg/dL (ref 65–99)
Glucose-Capillary: 119 mg/dL — ABNORMAL HIGH (ref 65–99)

## 2017-05-14 LAB — HEPARIN LEVEL (UNFRACTIONATED): HEPARIN UNFRACTIONATED: 0.56 [IU]/mL (ref 0.30–0.70)

## 2017-05-14 MED ORDER — METOPROLOL SUCCINATE ER 100 MG PO TB24
100.0000 mg | ORAL_TABLET | Freq: Every day | ORAL | Status: DC
  Administered 2017-05-15 – 2017-05-17 (×3): 100 mg via ORAL
  Filled 2017-05-14 (×3): qty 1

## 2017-05-14 MED ORDER — METOPROLOL SUCCINATE ER 25 MG PO TB24
25.0000 mg | ORAL_TABLET | Freq: Once | ORAL | Status: AC
  Administered 2017-05-14: 25 mg via ORAL
  Filled 2017-05-14: qty 1

## 2017-05-14 NOTE — Progress Notes (Signed)
Patient only wore CPAP briefly. IT is doubtful she will ever accept using it.

## 2017-05-14 NOTE — Progress Notes (Signed)
Progress Note  Patient Name: [name] Date of Encounter: 05/14/2017  Primary Cardiologist: Dr. Lovena Le  Subjective   Breathing at baseline. No chest pain or palpitations. Has been NPO since midnight in case cath could be performed today.   Inpatient Medications    Scheduled Meds: . allopurinol  100 mg Oral BID  . aspirin EC  81 mg Oral Daily  . atorvastatin  40 mg Oral q1800  . calcium carbonate  1,250 mg Oral BID WC  . cholecalciferol  1,000 Units Oral Daily  . diclofenac sodium  2 g Topical QID  . ferrous sulfate  325 mg Oral Q breakfast  . gabapentin  100 mg Oral TID  . insulin aspart  0-5 Units Subcutaneous QHS  . insulin aspart  0-9 Units Subcutaneous TID WC  . levothyroxine  125 mcg Oral QAC breakfast  . magnesium oxide  400 mg Oral Daily  . mouth rinse  15 mL Mouth Rinse BID  . metoprolol succinate  75 mg Oral Daily  . pantoprazole  40 mg Oral BID  . potassium chloride  10 mEq Oral Daily  . sodium chloride flush  3 mL Intravenous Q12H   Continuous Infusions: . sodium chloride    . heparin 950 Units/hr (05/14/17 0303)   PRN Meds: sodium chloride, acetaminophen, albuterol, cyclobenzaprine, sodium chloride flush, temazepam   Vital Signs    Vitals:   05/13/17 2054 05/14/17 0020 05/14/17 0500 05/14/17 0628  BP: 111/78   110/72  Pulse: 89 86  (!) 101  Resp: 16 16  18   Temp: (!) 97.4 F (36.3 C)   97.6 F (36.4 C)  TempSrc: Oral   Oral  SpO2: 97% 94%  95%  Weight:   136 lb (61.7 kg)   Height:        Intake/Output Summary (Last 24 hours) at 05/14/2017 0854 Last data filed at 05/14/2017 0800 Gross per 24 hour  Intake 327.5 ml  Output 100 ml  Net 227.5 ml   Filed Weights   05/12/17 1501 05/13/17 1559 05/14/17 0500  Weight: 142 lb 11.2 oz (64.7 kg) 143 lb 3.2 oz (65 kg) 136 lb (61.7 kg)    Telemetry    Atrial Fibrillation, HR in 90's - 110's.  - Personally Reviewed  ECG    No new tracings.   Physical Exam   General: Well developed,  well nourished Caucasian female appearing in no acute distress. Head: Normocephalic, atraumatic.  Neck: Supple without bruits, JVD not elevated. Lungs:  Resp regular and unlabored, CTA without wheezing or rales. Heart: Irregularly irregular, S1, S2, no S3, S4, or murmur; no rub. Abdomen: Soft, non-tender, non-distended with normoactive bowel sounds. No hepatomegaly. No rebound/guarding. No obvious abdominal masses. Extremities: No clubbing, cyanosis, or edema. Distal pedal pulses are 2+ bilaterally. Neuro: Alert and oriented X 3. Moves all extremities spontaneously. Psych: Normal affect.  Labs    Chemistry Recent Labs  Lab 05/09/17 1010  05/12/17 0420 05/13/17 0607 05/14/17 0544  NA 133*   < > 135 137 138  K 3.5   < > 3.9 4.1 4.2  CL 100*   < > 96* 101 102  CO2 21*   < > 28 26 26   GLUCOSE 194*   < > 108* 119* 125*  BUN 33*   < > 37* 33* 29*  CREATININE 1.68*   < > 1.72* 1.41* 1.41*  CALCIUM 9.1   < > 9.8 9.3 9.4  PROT 7.1  --   --   --   --  ALBUMIN 3.7  --   --   --   --   AST 53*  --   --   --   --   ALT 55*  --   --   --   --   ALKPHOS 105  --   --   --   --   BILITOT 0.8  --   --   --   --   GFRNONAA 32*   < > 31* 39* 39*  GFRAA 37*   < > 36* 46* 46*  ANIONGAP 12   < > 11 10 10    < > = values in this interval not displayed.     Hematology Recent Labs  Lab 05/11/17 0356 05/12/17 0420 05/13/17 0607  WBC 8.7 9.0 8.9  RBC 4.33 4.59 4.29  HGB 13.0 13.2 12.5  HCT 40.2 42.3 39.8  MCV 92.8 92.2 92.8  MCH 30.0 28.8 29.1  MCHC 32.3 31.2 31.4  RDW 17.1* 16.7* 16.4*  PLT 224 243 218    Cardiac Enzymes Recent Labs  Lab 05/09/17 1010 05/09/17 1511 05/09/17 2104 05/10/17 0424  TROPONINI 0.42* 0.37* 0.35* 0.33*   No results for input(s): TROPIPOC in the last 168 hours.   BNP Recent Labs  Lab 05/09/17 1010  BNP 826.0*     DDimer  Recent Labs  Lab 05/09/17 1511  DDIMER 2.26*     Radiology    No results found.  Cardiac Studies   Echocardiogram:  05/09/2017 Study Conclusions  - Left ventricle: The cavity size was normal. Wall thickness was   increased in a pattern of mild LVH. The estimated ejection   fraction was 25%. There is akinesis of the basal-midinferolateral   and inferior myocardium. The study is not technically sufficient   to allow evaluation of LV diastolic function. - Aortic valve: Mildly calcified annulus. Trileaflet. There was   trivial regurgitation. - Mitral valve: Mildly calcified annulus. Mildly thickened leaflets   . There was mild regurgitation. - Left atrium: The atrium was severely dilated. - Right ventricle: Systolic function was moderately reduced. - Right atrium: The atrium was mildly dilated. Central venous   pressure (est): 3 mm Hg. - Tricuspid valve: There was mild-moderate regurgitation. - Pulmonary arteries: PA peak pressure: 45 mm Hg (S). - Pericardium, extracardiac: There was no pericardial effusion.  Impressions:  - Mild LVH with LVEF approximately 25% and akinesis of the mid to   basal inferior/inferolateral wall. Indeterminate diastolic   function. LVEF has decreased compared to the prior study from   June. Severe left atrial enlargement. Mild mitral annular   calcification with mild mitral regurgitation. Moderate RV   dysfunction. Mild to moderate tricuspid regurgitation with PASP   estimated 45 mmHg.  Patient Profile     61 y.o. female w/ PMHof persistent atrial fibrillation(declines anticoagulation), amiodarone induced pulmonary toxicity, chronic diastolic heart failure, HTN, HLD, and Type 2 DMwhopresented to [facility] ED on 05/09/2017 for evaluation of worsening dyspnea. Cardiology consulted for further evaluation.  Assessment & Plan    1. Progressive dyspnea on exertion/ Acute on Chronic Combined Systolic and Diastolic CHF - presented with worsening dyspnea on exertion and orthopnea, found to have an elevated BNP of 826 and CXR showing pulmonary vascular congestion.  -  diuresed well with IV Lasix as weight is down from 149 lbs on admission to 136 lbs today (unsure of accuracy as weight was 143 lbs on 11/18) - a repeat echocardiogram was obtained on 11/14 and showed a newly  reduced EF of 25% with akinesis of the basal-mid inferolateral and inferior myocardium. EF was previously preserved at 50-55% by echo in 11/2016 with no regional WMA noted at that time. Initially thought cardiac catheterization would be today but the cath board is full. Will schedule for tomorrow. The patient understands that risks include but are not limited to stroke (1 in 1000), death (1 in 22), kidney failure [usually temporary] (1 in 500), bleeding (1 in 200), allergic reaction [possibly serious] (1 in 200). Patient will need to be transported to [facility] prior to the procedure.   2. Elevated troponin - cyclic values have been elevated at 0.42, 0.37, 0.35, and 0.33. - echo shows a reduced EF with WMA as above. She will need a cardiac catheterization for definitive evaluation in the setting of her newly reduced EF.  - remains on Heparin, statin, and BB therapy.   3. Hypertension - BP has been well-controlled at 110/69 - 112/78 within the past 24 hours.  - Losartan held. Toprol-XL has been titrated to 75mg  daily, will further titrate to 100mg  daily in the setting of her elevated HR.   4. Persistent atrial fibrillation - HR remains elevated in the 90's to 110's, therefore will further titrate Toprol-XL to 100mg  daily. Avoid Cardizem in the setting of her reduced EF. Not on Amiodarone due to history of amiodarone toxicity.  - she was previously on Xarelto but this was held in August due to concern for a GIB and she has not been on anticoagulation since. Continue Heparin until catheterization has been performed.   5. Hyperlipidemia - on Pravastatin PTA. Switched to Lipitor 40 mg daily as outlined in prior notes.  6. Hypothyroidism - TSH is within normal limits this admission.  -  continue Synthroid.   7. Type 2 diabetes mellitus - on SSI. Per admitting team.   8. Stage 3 CKD - baseline creatinine 1.4 - 1.5. At 1.68 on admission and improved to 1.41 today.   9. Elevated D-dimer - at 2.26 on admission. Has not undergone a CTA due to her CKD. Lower extremity dopplers without evidence of a DVT.  - per admitting team.    For questions or updates, please contact [facility] Please consult [email or web address] for contact info under Cardiology/STEMI.   Arna Medici , PA-C 8:54 AM 05/14/2017 Pager: [phone]  Attending note Patient seen and discussed with PA Ahmed Prima, I agree with her documentation above. Admitted with acute on chronic combined systolic HF. Echo this admit LVEF 25%, down from 50-55% just in 11/2016. I/Os are incomplete, off diuretics due to bump in Cr that is now improving. Medical therapy with Toprol XL 100mg  daily. No ACE-I/ARB/ARNI/aldactone given renal function, though improving likely can start after cath. F/u filling pressures to decide on diuretic regimen. With her drop in LVEF and elevated troponin plans are for [facility] tomorrow. Regarding elevated troponin she is on ASA, atorva 50, toprol 100, no ACE as described above. For her afib rate controlled with Toprol (increase to 100mg  daily for ongoing elevated rates)and heparin gtt for stroke prevention with plans for invasive procedure. ,  Plan for trasnfer today for anticipated cath tomorrow.   Carlyle Dolly MD

## 2017-05-14 NOTE — Progress Notes (Signed)
[place] for Heparin Indication: chest pain/ACS  Allergies  Allergen Reactions  . Flecainide Nausea Only and Other (See Comments)    Faint feeling  . Hydrocodone-Acetaminophen Nausea Only and Other (See Comments)    Severe headache  . Ibuprofen Other (See Comments)    Kidney dysfunction  . Oxycodone Hcl Nausea Only and Other (See Comments)    Headache  . Penicillins Nausea Only and Other (See Comments)    Severe headache Has patient had a PCN reaction causing immediate rash, facial/tongue/throat swelling, SOB or lightheadedness with hypotension: No Has patient had a PCN reaction causing severe rash involving mucus membranes or skin necrosis: No Has patient had a PCN reaction that required hospitalization: No Has patient had a PCN reaction occurring within the last 10 years: No If all of the above answers are "NO", then may proceed with Cephalosporin use.     Patient Measurements: Height: 4\' 10"  (147.3 cm) Weight: 136 lb (61.7 kg) IBW/kg (Calculated) : 40.9 HEPARIN DW (KG): 55.2   Vital Signs: Temp: 97.6 F (36.4 C) (11/19 0628) Temp Source: Oral (11/19 0628) BP: 110/72 (11/19 0628) Pulse Rate: 101 (11/19 0628)  Labs: Recent Labs    05/12/17 0420 05/13/17 0607 05/14/17 0544  HGB 13.2 12.5  --   HCT 42.3 39.8  --   PLT 243 218  --   HEPARINUNFRC 0.55 0.70 0.56  CREATININE 1.72* 1.41* 1.41*    Estimated Creatinine Clearance: 32.5 mL/min (A) (by C-G formula based on SCr of 1.41 mg/dL (H)).  Assessment: 61 yo lady on heparin for CP. Heparin level below goal this AM.  No bleeding noted, CBC stable.  Afib and Elevated troponin: Troponins remain elevated but trending down.  Demand ischemia versus an evolving non-STEMI.  Heparin remains therapeutic .  Goal of Therapy:  Heparin level 0.3-0.7 units/ml Monitor platelets by anticoagulation protocol: Yes   Plan:  Continue heparin drip at 950 units/hr Daily heparin level and  CBC Monitor for bleeding complications  Isac Sarna, BS [place], BCPS Clinical Pharmacist Pager [phone] 05/14/2017,7:49 AM

## 2017-05-14 NOTE — Progress Notes (Signed)
PROGRESS NOTE    [name]   RCV:[id number]  DOB: Mar 21, 1956  DOA: 05/09/2017 PCP: Tamara Squibb, MD   HPI:   [name] is a 61 y.o. female with medical history of chronic dCHf (grade 2 on 6/18), persistent A-fib with Amio induced pulmonary toxicity , gout, DM 2 who presents to the ER for mainly for episodes of dyspnea on exertion for 2 wks which have become worse. She is now barely able to walk 10 feet without feeling short of breath, faint and dizzy. She also complains of progressive pedal edema for 2 days and palpitations. She was placed on Metoprolol at the end of Sept for palpitations but she still notices palpitation when she moves or has episodes of feeling hot (when siting still). She states her PCP, [name] took her off of Xarelto in July which she had been taking for 3 yrs.  She states that she has been having episodes of "feeling hot" associated with shortness of breath and palpitations. She sometimes thinks they are panic attack and otherwise has no issues with anxiety or depression.  She has a mild cough which she states started this morning.    Subjective: Afebrile, no CP, no nausea, no vomiting and denies palpitations.  Assessment & Plan: Atrial tachycardia - paroxsymal SVT- NSVT - 11/14> HR in going up to 160s, short runs of V tach - started Cardizem infusion which helped rate improve- replaced electrolytes - K and Mg were low normal - 11/15 Cardizem DC'd and Metoprolol increased-  increase Metoprolol again to 75 on 11/16 and a dose of Dig given by Cardiology- HR has  Improved - Xarelto discontinued in July/ Aug of this year due to GI bleed and IDA- EGD was unrevealing. -stool hemoccult negative on 11/15- will check again -on Heparin infusion which I will continue as she will need cath  Dyspnea on exertion- Acute systolic and diastolic CHF, right heart failure - ambulatory pulse ox 89% in ER - improved to 94% on 11/15 - no need for further work up - previous  ECHO showed normal EF and grade 2dCHF (6/18) - repeat Echo on 05/09/17:  Mild LVH with LVEF approximately 25% and akinesis of the mid to basal inferior/inferolateral wall. Indeterminate diastolic function. LVEF has decreased compared to the prior study from June. Severe left atrial enlargement. Mild mitral annular calcification with mild mitral regurgitation. Moderate RV dysfunction. Mild to moderate tricuspid regurgitation with PASP estimated 45 mmHg. -will need a cardiac cath to further evaluate WMA and drop in EF -continue holding Lasix for now- BP dropping & Pulse ox improved  - follow daily weights and further cardiology rec's.  Elevated troponin - mild with flat trend- likely due to above-   Right lumbar pain - tender to touch - abdomen non tender- start Voltaren gel and encourage OOB -continue PRN Flexeril  Elevated D Dimer -may be non-specific- LE duplex is negative and pulse ox has improved with diuresis -denies pleuritic CP and SOB  GI bleeding -Colonoscopy 6/17- > medium sized non-bleeding internal hemorrhoids, diverticulosis. -underwent banding of hemmorhoids but continued to have occasional fresh blood on clothing -EGD- 8/18- normal -cont Iron-  Hemoccult is negative and Hgb stable -no acute bleeding appreciated currently  Hypotension -cont Metoprolol for SVT -continue holding Losartan (due to low BP and acute on chronic renal failure)  Varicose veins of bilateral lower extremities with other complications - R > L  -no open ulcers    DM (diabetes mellitus) with nephropathy  -  continue holding oral hypoglycemic regimen  -continue SSI  Acute on CKD (chronic kidney disease), stage III -improved and back to baseline -continue monitoring renal function intermittently  OSA (obstructive sleep apnea) -continue CPAP at bedtime -will need repeat sleep study as an outpatient and   ILD (interstitial lung disease)   -no wheezing, normal resp effort  and no requiring oxygen supplementation  IDA -cont Iron supplementation -Hgb stable -no signs of acute bleeding   GERD -continue Protonix BID  HLD -will continue Lipitor   Gout -no acute flare; will continue Allopurinol    DVT prophylaxis: Heparin infusion Code Status: Full  Family Communication: no family at bedside Disposition Plan: will transfer to [facility] as per cardiology request for heart cath on 05/15/17. Continue heparin drip, continue ASA, Lipitor and Toprol.  Consultants:   Cardiology Procedures:   2 D ECHO  Antimicrobials:  Anti-infectives (From admission, onward)   None      Objective: Vitals:   05/14/17 0020 05/14/17 0500 05/14/17 0628 05/14/17 1325  BP:   110/72 108/66  Pulse: 86  (!) 101 84  Resp: 16  18 18   Temp:   97.6 F (36.4 C) 98.3 F (36.8 C)  TempSrc:   Oral Oral  SpO2: 94%  95% 98%  Weight:  61.7 kg (136 lb)    Height:        Intake/Output Summary (Last 24 hours) at 05/14/2017 1539 Last data filed at 05/14/2017 1200 Gross per 24 hour  Intake 480 ml  Output -  Net 480 ml   Filed Weights   05/12/17 1501 05/13/17 1559 05/14/17 0500  Weight: 64.7 kg (142 lb 11.2 oz) 65 kg (143 lb 3.2 oz) 61.7 kg (136 lb)    Examination: General exam: afebrile, no CP, no SOB, no nausea or vomiting and denies palpitations.   HEENT: PERRLA, moist mucous membranes, no icterus, no thrush.   Respiratory system: Good air movement bilaterally, normal respiratory effort, no wheezing, no crackles. Cardiovascular system: S1 and S2, no rubs, no gallops, rate controlled.  Gastrointestinal system: abd soft, NT, ND, positive BS; no organomegaly Central nervous system: AAOX3, CN intact, no focal neurologic or motor deficit. Extremities: no cyanosis, no clubbing, no edema Skin: No rashes or ulcers Psychiatry:  Mood & affect appropriate; good insight.   Data Reviewed: I have personally reviewed following labs and imaging studies  CBC: Recent Labs  Lab  05/09/17 1010 05/10/17 0424 05/11/17 0356 05/12/17 0420 05/13/17 0607  WBC 9.7 8.9 8.7 9.0 8.9  NEUTROABS 6.0  --   --   --   --   HGB 13.1 12.6 13.0 13.2 12.5  HCT 40.9 39.5 40.2 42.3 39.8  MCV 92.3 92.5 92.8 92.2 92.8  PLT 205 219 224 243 378   Basic Metabolic Panel: Recent Labs  Lab 05/09/17 1511 05/10/17 0424 05/11/17 0356 05/12/17 0420 05/13/17 0607 05/14/17 0544  NA  --  139 136 135 137 138  K  --  4.2 3.8 3.9 4.1 4.2  CL  --  104 99* 96* 101 102  CO2  --  25 26 28 26 26   GLUCOSE  --  111* 102* 108* 119* 125*  BUN  --  32* 33* 37* 33* 29*  CREATININE  --  1.69* 1.65* 1.72* 1.41* 1.41*  CALCIUM  --  9.3 9.9 9.8 9.3 9.4  MG 1.7 2.0  --   --   --   --    GFR: Estimated Creatinine Clearance: 32.5 mL/min (A) (by C-G  formula based on SCr of 1.41 mg/dL (H)).   Liver Function Tests: Recent Labs  Lab 05/09/17 1010  AST 53*  ALT 55*  ALKPHOS 105  BILITOT 0.8  PROT 7.1  ALBUMIN 3.7   Coagulation Profile: Recent Labs  Lab 05/09/17 1011  INR 1.20   Cardiac Enzymes: Recent Labs  Lab 05/09/17 1010 05/09/17 1511 05/09/17 2104 05/10/17 0424  TROPONINI 0.42* 0.37* 0.35* 0.33*   BNP (last 3 results) Recent Labs    12/04/16 1111  PROBNP 376.0*   CBG: Recent Labs  Lab 05/13/17 1144 05/13/17 1548 05/13/17 2057 05/14/17 0715 05/14/17 1118  GLUCAP 134* 129* 176* 118* 119*   Urine analysis:    Component Value Date/Time   COLORURINE YELLOW 01/05/2017 [place] 01/05/2017 1445   LABSPEC 1.010 01/05/2017 1445   PHURINE 8.0 01/05/2017 1445   GLUCOSEU NEGATIVE 01/05/2017 1445   HGBUR NEGATIVE 01/05/2017 1445   BILIRUBINUR NEGATIVE 01/05/2017 1445   [place] 01/05/2017 1445   PROTEINUR 30 (A) 01/05/2017 1445   UROBILINOGEN 0.2 11/06/2013 2335   NITRITE NEGATIVE 01/05/2017 1445   LEUKOCYTESUR NEGATIVE 01/05/2017 1445    Recent Results (from the past 240 hour(s))  MRSA PCR Screening     Status: None   Collection Time:  05/09/17  6:10 PM  Result Value Ref Range Status   MRSA by PCR NEGATIVE NEGATIVE Final    Comment:        The GeneXpert MRSA Assay (FDA approved for NASAL specimens only), is one component of a comprehensive MRSA colonization surveillance program. It is not intended to diagnose MRSA infection nor to guide or monitor treatment for MRSA infections.      Radiology Studies: No results found.    Scheduled Meds: . allopurinol  100 mg Oral BID  . aspirin EC  81 mg Oral Daily  . atorvastatin  40 mg Oral q1800  . calcium carbonate  1,250 mg Oral BID WC  . cholecalciferol  1,000 Units Oral Daily  . diclofenac sodium  2 g Topical QID  . ferrous sulfate  325 mg Oral Q breakfast  . gabapentin  100 mg Oral TID  . insulin aspart  0-5 Units Subcutaneous QHS  . insulin aspart  0-9 Units Subcutaneous TID WC  . levothyroxine  125 mcg Oral QAC breakfast  . magnesium oxide  400 mg Oral Daily  . mouth rinse  15 mL Mouth Rinse BID  . [START ON 05/15/2017] metoprolol succinate  100 mg Oral Daily  . pantoprazole  40 mg Oral BID  . potassium chloride  10 mEq Oral Daily  . sodium chloride flush  3 mL Intravenous Q12H   Continuous Infusions: . sodium chloride    . heparin 950 Units/hr (05/14/17 0303)     LOS: 5 days    Time spent in minutes: 25 minutes    Barton Dubois, MD [facility] Pager: [phone] [email or web address] Password [facility] 05/14/2017, 3:39 PM

## 2017-05-15 ENCOUNTER — Encounter (HOSPITAL_COMMUNITY): Payer: Self-pay | Admitting: Cardiovascular Disease

## 2017-05-15 ENCOUNTER — Inpatient Hospital Stay (HOSPITAL_COMMUNITY)
Admission: EM | Disposition: A | Discharge status: Home / Self Care | Payer: Self-pay | Source: Home / Self Care | Attending: Internal Medicine

## 2017-05-15 DIAGNOSIS — I481 Persistent atrial fibrillation: Secondary | ICD-10-CM | POA: Diagnosis not present

## 2017-05-15 DIAGNOSIS — R0602 Shortness of breath: Secondary | ICD-10-CM | POA: Diagnosis not present

## 2017-05-15 DIAGNOSIS — J849 Interstitial pulmonary disease, unspecified: Secondary | ICD-10-CM | POA: Diagnosis not present

## 2017-05-15 DIAGNOSIS — I509 Heart failure, unspecified: Secondary | ICD-10-CM | POA: Diagnosis not present

## 2017-05-15 DIAGNOSIS — D509 Iron deficiency anemia, unspecified: Secondary | ICD-10-CM | POA: Diagnosis not present

## 2017-05-15 DIAGNOSIS — R748 Abnormal levels of other serum enzymes: Secondary | ICD-10-CM | POA: Diagnosis not present

## 2017-05-15 DIAGNOSIS — R791 Abnormal coagulation profile: Secondary | ICD-10-CM | POA: Diagnosis not present

## 2017-05-15 DIAGNOSIS — I428 Other cardiomyopathies: Secondary | ICD-10-CM | POA: Diagnosis not present

## 2017-05-15 DIAGNOSIS — I472 Ventricular tachycardia: Secondary | ICD-10-CM | POA: Diagnosis not present

## 2017-05-15 DIAGNOSIS — N179 Acute kidney failure, unspecified: Secondary | ICD-10-CM | POA: Diagnosis not present

## 2017-05-15 DIAGNOSIS — M109 Gout, unspecified: Secondary | ICD-10-CM | POA: Diagnosis not present

## 2017-05-15 DIAGNOSIS — R7989 Other specified abnormal findings of blood chemistry: Secondary | ICD-10-CM

## 2017-05-15 DIAGNOSIS — E1122 Type 2 diabetes mellitus with diabetic chronic kidney disease: Secondary | ICD-10-CM | POA: Diagnosis not present

## 2017-05-15 DIAGNOSIS — N183 Chronic kidney disease, stage 3 (moderate): Secondary | ICD-10-CM | POA: Diagnosis not present

## 2017-05-15 DIAGNOSIS — I2510 Atherosclerotic heart disease of native coronary artery without angina pectoris: Secondary | ICD-10-CM | POA: Diagnosis not present

## 2017-05-15 DIAGNOSIS — E1121 Type 2 diabetes mellitus with diabetic nephropathy: Secondary | ICD-10-CM | POA: Diagnosis not present

## 2017-05-15 DIAGNOSIS — I5021 Acute systolic (congestive) heart failure: Secondary | ICD-10-CM | POA: Diagnosis not present

## 2017-05-15 DIAGNOSIS — I5043 Acute on chronic combined systolic (congestive) and diastolic (congestive) heart failure: Secondary | ICD-10-CM | POA: Diagnosis not present

## 2017-05-15 DIAGNOSIS — I700 Atherosclerosis of aorta: Secondary | ICD-10-CM | POA: Diagnosis not present

## 2017-05-15 DIAGNOSIS — E871 Hypo-osmolality and hyponatremia: Secondary | ICD-10-CM | POA: Diagnosis not present

## 2017-05-15 DIAGNOSIS — E890 Postprocedural hypothyroidism: Secondary | ICD-10-CM | POA: Diagnosis not present

## 2017-05-15 DIAGNOSIS — I471 Supraventricular tachycardia: Secondary | ICD-10-CM | POA: Diagnosis not present

## 2017-05-15 DIAGNOSIS — G4733 Obstructive sleep apnea (adult) (pediatric): Secondary | ICD-10-CM | POA: Diagnosis not present

## 2017-05-15 DIAGNOSIS — R778 Other specified abnormalities of plasma proteins: Secondary | ICD-10-CM | POA: Diagnosis present

## 2017-05-15 DIAGNOSIS — R05 Cough: Secondary | ICD-10-CM | POA: Diagnosis not present

## 2017-05-15 DIAGNOSIS — I959 Hypotension, unspecified: Secondary | ICD-10-CM | POA: Diagnosis not present

## 2017-05-15 DIAGNOSIS — I130 Hypertensive heart and chronic kidney disease with heart failure and stage 1 through stage 4 chronic kidney disease, or unspecified chronic kidney disease: Secondary | ICD-10-CM | POA: Diagnosis not present

## 2017-05-15 DIAGNOSIS — I081 Rheumatic disorders of both mitral and tricuspid valves: Secondary | ICD-10-CM | POA: Diagnosis not present

## 2017-05-15 DIAGNOSIS — I83893 Varicose veins of bilateral lower extremities with other complications: Secondary | ICD-10-CM | POA: Diagnosis not present

## 2017-05-15 DIAGNOSIS — E1151 Type 2 diabetes mellitus with diabetic peripheral angiopathy without gangrene: Secondary | ICD-10-CM | POA: Diagnosis not present

## 2017-05-15 HISTORY — PX: RIGHT/LEFT HEART CATH AND CORONARY ANGIOGRAPHY: CATH118266

## 2017-05-15 LAB — CBC
HCT: 40.1 % (ref 36.0–46.0)
HEMATOCRIT: 42.4 % (ref 36.0–46.0)
HEMOGLOBIN: 13.3 g/dL (ref 12.0–15.0)
Hemoglobin: 12.2 g/dL (ref 12.0–15.0)
MCH: 28.9 pg (ref 26.0–34.0)
MCH: 29.9 pg (ref 26.0–34.0)
MCHC: 30.4 g/dL (ref 30.0–36.0)
MCHC: 31.4 g/dL (ref 30.0–36.0)
MCV: 95 fL (ref 78.0–100.0)
MCV: 95.3 fL (ref 78.0–100.0)
PLATELETS: 234 10*3/uL (ref 150–400)
Platelets: 212 10*3/uL (ref 150–400)
RBC: 4.22 MIL/uL (ref 3.87–5.11)
RBC: 4.45 MIL/uL (ref 3.87–5.11)
RDW: 16.8 % — AB (ref 11.5–15.5)
RDW: 17.1 % — AB (ref 11.5–15.5)
WBC: 8.6 10*3/uL (ref 4.0–10.5)
WBC: 8 10*3/uL (ref 4.0–10.5)

## 2017-05-15 LAB — POCT I-STAT 3, VENOUS BLOOD GAS (G3P V)
ACID-BASE EXCESS: 2 mmol/L (ref 0.0–2.0)
Bicarbonate: 27.4 mmol/L (ref 20.0–28.0)
O2 Saturation: 60 %
PCO2 VEN: 43.2 mmHg — AB (ref 44.0–60.0)
PH VEN: 7.41 (ref 7.250–7.430)
PO2 VEN: 31 mmHg — AB (ref 32.0–45.0)
TCO2: 29 mmol/L (ref 22–32)

## 2017-05-15 LAB — CREATININE, SERUM
Creatinine, Ser: 1.39 mg/dL — ABNORMAL HIGH (ref 0.44–1.00)
GFR calc Af Amer: 46 mL/min — ABNORMAL LOW (ref >60)
GFR, EST NON AFRICAN AMERICAN: 40 mL/min — AB (ref >60)

## 2017-05-15 LAB — BASIC METABOLIC PANEL
Anion gap: 10 (ref 5–15)
BUN: 30 mg/dL — AB (ref 6–20)
CHLORIDE: 101 mmol/L (ref 101–111)
CO2: 25 mmol/L (ref 22–32)
CREATININE: 1.57 mg/dL — AB (ref 0.44–1.00)
Calcium: 9.4 mg/dL (ref 8.9–10.3)
GFR calc Af Amer: 40 mL/min — ABNORMAL LOW (ref >60)
GFR calc non Af Amer: 35 mL/min — ABNORMAL LOW (ref >60)
Glucose, Bld: 123 mg/dL — ABNORMAL HIGH (ref 65–99)
Potassium: 4.3 mmol/L (ref 3.5–5.1)
SODIUM: 136 mmol/L (ref 135–145)

## 2017-05-15 LAB — POCT ACTIVATED CLOTTING TIME: ACTIVATED CLOTTING TIME: 87 s

## 2017-05-15 LAB — POCT I-STAT 3, ART BLOOD GAS (G3+)
Acid-Base Excess: 1 mmol/L (ref 0.0–2.0)
Bicarbonate: 24.9 mmol/L (ref 20.0–28.0)
O2 Saturation: 92 %
PCO2 ART: 37.7 mmHg (ref 32.0–48.0)
PH ART: 7.427 (ref 7.350–7.450)
PO2 ART: 61 mmHg — AB (ref 83.0–108.0)
TCO2: 26 mmol/L (ref 22–32)

## 2017-05-15 LAB — GLUCOSE, CAPILLARY
Glucose-Capillary: 129 mg/dL — ABNORMAL HIGH (ref 65–99)
Glucose-Capillary: 118 mg/dL — ABNORMAL HIGH (ref 65–99)
Glucose-Capillary: 257 mg/dL — ABNORMAL HIGH (ref 65–99)
Glucose-Capillary: 129 mg/dL — ABNORMAL HIGH (ref 65–99)

## 2017-05-15 LAB — HEPARIN LEVEL (UNFRACTIONATED): Heparin Unfractionated: 0.19 [IU]/mL — ABNORMAL LOW (ref 0.30–0.70)

## 2017-05-15 SURGERY — RIGHT/LEFT HEART CATH AND CORONARY ANGIOGRAPHY
Anesthesia: LOCAL

## 2017-05-15 MED ORDER — LIDOCAINE HCL (PF) 1 % IJ SOLN
INTRAMUSCULAR | Status: DC | PRN
  Administered 2017-05-15: 20 mL

## 2017-05-15 MED ORDER — ONDANSETRON HCL 4 MG/2ML IJ SOLN: 4.0000 mg | Freq: Four times a day (QID) | INTRAMUSCULAR | Status: DC | PRN

## 2017-05-15 MED ORDER — HEPARIN BOLUS VIA INFUSION
2000.0000 [IU] | Freq: Once | INTRAVENOUS | Status: AC
  Administered 2017-05-15: 2000 [IU] via INTRAVENOUS
  Filled 2017-05-15: qty 2000

## 2017-05-15 MED ORDER — SODIUM CHLORIDE 0.9% FLUSH
3.0000 mL | Freq: Two times a day (BID) | INTRAVENOUS | Status: DC
  Administered 2017-05-16 – 2017-05-17 (×3): 3 mL via INTRAVENOUS

## 2017-05-15 MED ORDER — ASPIRIN 81 MG PO CHEW: 81.0000 mg | CHEWABLE_TABLET | ORAL | Status: DC

## 2017-05-15 MED ORDER — IOPAMIDOL (ISOVUE-370) INJECTION 76%
INTRAVENOUS | Status: DC | PRN
  Administered 2017-05-15: 65 mL via INTRA_ARTERIAL

## 2017-05-15 MED ORDER — ACETAMINOPHEN 325 MG PO TABS
650.0000 mg | ORAL_TABLET | ORAL | Status: DC | PRN
  Administered 2017-05-15 – 2017-05-16 (×3): 650 mg via ORAL
  Filled 2017-05-15 (×3): qty 2

## 2017-05-15 MED ORDER — SODIUM CHLORIDE 0.9 % IV SOLN
INTRAVENOUS | Status: DC
  Administered 2017-05-15: 08:00:00 via INTRAVENOUS

## 2017-05-15 MED ORDER — HEPARIN (PORCINE) IN NACL 2-0.9 UNIT/ML-% IJ SOLN
INTRAMUSCULAR | Status: AC | PRN
  Administered 2017-05-15: 1000 mL

## 2017-05-15 MED ORDER — SODIUM CHLORIDE 0.9 % IV SOLN: INTRAVENOUS | Status: DC

## 2017-05-15 MED ORDER — FENTANYL CITRATE (PF) 100 MCG/2ML IJ SOLN
INTRAMUSCULAR | Status: DC | PRN
  Administered 2017-05-15: 25 ug via INTRAVENOUS

## 2017-05-15 MED ORDER — HEPARIN SODIUM (PORCINE) 5000 UNIT/ML IJ SOLN
5000.0000 [IU] | Freq: Three times a day (TID) | INTRAMUSCULAR | Status: DC
  Administered 2017-05-16 – 2017-05-17 (×4): 5000 [IU] via SUBCUTANEOUS
  Filled 2017-05-15 (×4): qty 1

## 2017-05-15 MED ORDER — SODIUM CHLORIDE 0.9 % IV SOLN: INTRAVENOUS | Status: AC

## 2017-05-15 MED ORDER — MIDAZOLAM HCL 2 MG/2ML IJ SOLN
INTRAMUSCULAR | Status: DC | PRN
  Administered 2017-05-15: 1 mg via INTRAVENOUS

## 2017-05-15 MED ORDER — ASPIRIN 81 MG PO CHEW: 81.0000 mg | CHEWABLE_TABLET | Freq: Every day | ORAL | Status: DC

## 2017-05-15 MED ORDER — SODIUM CHLORIDE 0.9 % IV SOLN: 250.0000 mL | INTRAVENOUS | Status: DC | PRN

## 2017-05-15 MED ORDER — SODIUM CHLORIDE 0.9% FLUSH: 3.0000 mL | INTRAVENOUS | Status: DC | PRN

## 2017-05-15 SURGICAL SUPPLY — 15 items
CATH INFINITI 5FR MULTPACK ANG (CATHETERS) ×2 IMPLANT
CATH LAUNCHER 5F NOTO (CATHETERS) ×1 IMPLANT
CATH SWAN GANZ 7F STRAIGHT (CATHETERS) ×2 IMPLANT
CATHETER LAUNCHER 5F NOTO (CATHETERS) ×2
GLIDESHEATH SLEND SS 6F .021 (SHEATH) IMPLANT
GUIDEWIRE INQWIRE 1.5J.035X260 (WIRE) ×1 IMPLANT
INQWIRE 1.5J .035X260CM (WIRE) ×2
KIT HEART LEFT (KITS) ×2 IMPLANT
PACK CARDIAC CATHETERIZATION (CUSTOM PROCEDURE TRAY) ×2 IMPLANT
SHEATH GLIDE SLENDER 4/5FR (SHEATH) IMPLANT
SHEATH PINNACLE 5F 10CM (SHEATH) ×2 IMPLANT
SHEATH PINNACLE 7F 10CM (SHEATH) ×2 IMPLANT
TRANSDUCER W/STOPCOCK (MISCELLANEOUS) ×2 IMPLANT
TUBING CIL FLEX 10 FLL-RA (TUBING) ×2 IMPLANT
WIRE EMERALD 3MM-J .035X150CM (WIRE) ×2 IMPLANT

## 2017-05-15 NOTE — Progress Notes (Signed)
Received pt from [facility] alert and IVF infusing.  Pt on monitor and consent signed.  Pt to procedure area via stretcher.

## 2017-05-15 NOTE — H&P (View-Only) (Signed)
Progress Note  Patient Name: [name] Date of Encounter: 05/15/2017  Primary Cardiologist: Dr. Lovena Le  Subjective   No chest pain or palpitations. Breathing at baseline. Does note mild abdominal distension this morning with associated flatulence. Last BM was yesterday.   Inpatient Medications    Scheduled Meds: . allopurinol  100 mg Oral BID  . aspirin EC  81 mg Oral Daily  . atorvastatin  40 mg Oral q1800  . calcium carbonate  1,250 mg Oral BID WC  . cholecalciferol  1,000 Units Oral Daily  . diclofenac sodium  2 g Topical QID  . ferrous sulfate  325 mg Oral Q breakfast  . gabapentin  100 mg Oral TID  . heparin  2,000 Units Intravenous Once  . insulin aspart  0-5 Units Subcutaneous QHS  . insulin aspart  0-9 Units Subcutaneous TID WC  . levothyroxine  125 mcg Oral QAC breakfast  . magnesium oxide  400 mg Oral Daily  . mouth rinse  15 mL Mouth Rinse BID  . metoprolol succinate  100 mg Oral Daily  . pantoprazole  40 mg Oral BID  . potassium chloride  10 mEq Oral Daily  . sodium chloride flush  3 mL Intravenous Q12H   Continuous Infusions: . sodium chloride    . sodium chloride    . heparin 950 Units/hr (05/14/17 0303)   PRN Meds: sodium chloride, acetaminophen, albuterol, cyclobenzaprine, sodium chloride flush, temazepam   Vital Signs    Vitals:   05/14/17 0628 05/14/17 1325 05/14/17 2123 05/15/17 0608  BP: 110/72 108/66 110/69 110/77  Pulse: (!) 101 84 80 82  Resp: 18 18 18 18   Temp: 97.6 F (36.4 C) 98.3 F (36.8 C) 98.5 F (36.9 C) 98.4 F (36.9 C)  TempSrc: Oral Oral Oral Oral  SpO2: 95% 98% 97% 98%  Weight:    149 lb 3.3 oz (67.7 kg)  Height:        Intake/Output Summary (Last 24 hours) at 05/15/2017 0802 Last data filed at 05/15/2017 0626 Gross per 24 hour  Intake 594 ml  Output -  Net 594 ml   Filed Weights   05/13/17 1559 05/14/17 0500 05/15/17 0608  Weight: 143 lb 3.2 oz (65 kg) 136 lb (61.7 kg) 149 lb 3.3 oz (67.7 kg)     Telemetry    Atrial fibrillation, HR in 90's to low-100's.  - Personally Reviewed  ECG    No new tracings.   Physical Exam   General: Well developed, well nourished Caucasian female appearing in no acute distress. Head: Normocephalic, atraumatic.  Neck: Supple without bruits, JVD not elevated. Lungs:  Resp regular and unlabored, CTA without wheezing or rales. Heart: Irregular irregular, S1, S2, no S3, S4, or murmur; no rub. Abdomen: Soft, non-tender, non-distended with normoactive bowel sounds. No hepatomegaly. No rebound/guarding. No obvious abdominal masses. Extremities: No clubbing, cyanosis, or lower extremity edema. Distal pedal pulses are 2+ bilaterally. Neuro: Alert and oriented X 3. Moves all extremities spontaneously. Psych: Normal affect.  Labs    Chemistry Recent Labs  Lab 05/09/17 1010  05/13/17 0607 05/14/17 0544 05/15/17 0426  NA 133*   < > 137 138 136  K 3.5   < > 4.1 4.2 4.3  CL 100*   < > 101 102 101  CO2 21*   < > 26 26 25   GLUCOSE 194*   < > 119* 125* 123*  BUN 33*   < > 33* 29* 30*  CREATININE 1.68*   < >  1.41* 1.41* 1.57*  CALCIUM 9.1   < > 9.3 9.4 9.4  PROT 7.1  --   --   --   --   ALBUMIN 3.7  --   --   --   --   AST 53*  --   --   --   --   ALT 55*  --   --   --   --   ALKPHOS 105  --   --   --   --   BILITOT 0.8  --   --   --   --   GFRNONAA 32*   < > 39* 39* 35*  GFRAA 37*   < > 46* 46* 40*  ANIONGAP 12   < > 10 10 10    < > = values in this interval not displayed.     Hematology Recent Labs  Lab 05/12/17 0420 05/13/17 0607 05/15/17 0426  WBC 9.0 8.9 8.6  RBC 4.59 4.29 4.22  HGB 13.2 12.5 12.2  HCT 42.3 39.8 40.1  MCV 92.2 92.8 95.0  MCH 28.8 29.1 28.9  MCHC 31.2 31.4 30.4  RDW 16.7* 16.4* 16.8*  PLT 243 218 234    Cardiac Enzymes Recent Labs  Lab 05/09/17 1010 05/09/17 1511 05/09/17 2104 05/10/17 0424  TROPONINI 0.42* 0.37* 0.35* 0.33*   No results for input(s): TROPIPOC in the last 168 hours.   BNP Recent  Labs  Lab 05/09/17 1010  BNP 826.0*     DDimer  Recent Labs  Lab 05/09/17 1511  DDIMER 2.26*     Radiology    No results found.  Cardiac Studies   Echocardiogram: 05/09/2017 Study Conclusions  - Left ventricle: The cavity size was normal. Wall thickness was   increased in a pattern of mild LVH. The estimated ejection   fraction was 25%. There is akinesis of the basal-midinferolateral   and inferior myocardium. The study is not technically sufficient   to allow evaluation of LV diastolic function. - Aortic valve: Mildly calcified annulus. Trileaflet. There was   trivial regurgitation. - Mitral valve: Mildly calcified annulus. Mildly thickened leaflets   . There was mild regurgitation. - Left atrium: The atrium was severely dilated. - Right ventricle: Systolic function was moderately reduced. - Right atrium: The atrium was mildly dilated. Central venous   pressure (est): 3 mm Hg. - Tricuspid valve: There was mild-moderate regurgitation. - Pulmonary arteries: PA peak pressure: 45 mm Hg (S). - Pericardium, extracardiac: There was no pericardial effusion.  Impressions:  - Mild LVH with LVEF approximately 25% and akinesis of the mid to   basal inferior/inferolateral wall. Indeterminate diastolic   function. LVEF has decreased compared to the prior study from   June. Severe left atrial enlargement. Mild mitral annular   calcification with mild mitral regurgitation. Moderate RV   dysfunction. Mild to moderate tricuspid regurgitation with PASP   estimated 45 mmHg.  Patient Profile     61 y.o. female w/ PMHof persistent atrial fibrillation(declines anticoagulation), amiodarone induced pulmonary toxicity, chronic diastolic heart failure, HTN, HLD, and Type 2 DMwhopresented to [facility] ED on 05/09/2017 for evaluation of worsening dyspnea. Cardiology consulted for further evaluation.  Assessment & Plan    1. Progressivedyspnea on exertion/ Acute on Chronic Combined  Systolic and Diastolic CHF - presented with worsening dyspnea on exertion and orthopnea, found to have an elevated BNP of 826 and CXR showing pulmonary vascular congestion.  - diuresed well with IV Lasix as weight is down from 149 lbs  on admission to 136 lbs on 11/19 (recorded as 149 lbs this AM --> doubt accuracy of this). Further diuresis will be dictated by cath results.  - a repeat echocardiogram was obtained on 11/14 and showed a newly reduced EF of25%withakinesis of the basal-mid inferolateral and inferior myocardium.EF was previously preserved at 50-55% by echo in 11/2016 with no regional WMA noted at that time. - for [facility] later today. Orders have been entered including for IV Hydration. Called [facility] and she will transported directly to the cath lab as no telemetry beds are currently available.   2. Elevated troponin -cyclic values have been elevated at 0.42, 0.37, 0.35, and 0.33. - echo shows a reduced EF with WMA as above. She will need a cardiac catheterization for definitive evaluation in the setting of her newly reduced EF.  - remains on Heparin, statin, and BB therapy.  3. Hypertension - BP has been well-controlled at 108/66 - 110/77 within the past 24 hours.  - Losartan held until after cath. Continue Toprol-XL 100mg  daily (recently titrated on 11/19).  4. Persistent atrial fibrillation -HR remains elevated in the 90's to 110's, therefore will further titrate Toprol-XL to 100mg  daily. Avoid Cardizem in the setting of her reduced EF. Not on Amiodarone due to history of amiodarone toxicity.  - she was previously on Xarelto but this was held in August due to concern for a GIB and she has not been on anticoagulation since. Continue Heparin until catheterization has been performed then switch back to NOAC.   5. Hyperlipidemia - on Pravastatin PTA. Switched toLipitor 40 mgdaily as outlined in prior notes.  6. Hypothyroidism - TSH is within normal limits this  admission.  - continue Synthroid.  7. Type 2 diabetes mellitus - on SSI while admitted.   8. Stage 3 CKD - baseline creatinine 1.4 - 1.5. Stable at 1.57 today. Starting IV Hydration for cath.   9. Elevated D-dimer - at 2.26 on admission. Has not undergone a CTA due to her CKD.Lower extremity dopplers without evidence of a DVT.  - per admitting team.    For questions or updates, please contact [place] Please consult [email or web address] for contact info under Cardiology/STEMI.   Arna Medici , PA-C 8:02 AM 05/15/2017 Pager: [phone]  Attending note Patient seen and discussed with PA Ahmed Prima, I agree with her documentation above. Acute drop in LVEF, she is awaiting LHC/RHC today. We increased her toprol to 100mg  daily in setting of afib and elevated rates, continue to monitor. Remains on heparin, convert to oral anticoag after procedure. Plan for transfer and cath today.   Carlyle Dolly MD

## 2017-05-15 NOTE — Progress Notes (Addendum)
Site area: RFA/RFV Site Prior to Removal:  Level  0 Pressure Applied For: 30 min Manual:  yes  Patient Status During Pull:  stable Post Pull Site:  Level 0 Post Pull Instructions Given:  yes Post Pull Pulses Present: palpable Dressing Applied:  tegaderm Bedrest begins @ 1330 till 1730 Comments:

## 2017-05-15 NOTE — Plan of Care (Signed)
Patient post cath, VSS, right femoral remains level 0.  Patient up out of bed walked to bathroom when bedrest up.  Sitting up in chair without complaint other than back pain for which she received Tylenol and voltaren gel with improvement.

## 2017-05-15 NOTE — Progress Notes (Signed)
[place] [place] for Heparin Indication: chest pain/ACS  Allergies  Allergen Reactions  . Flecainide Nausea Only and Other (See Comments)    Faint feeling  . Hydrocodone-Acetaminophen Nausea Only and Other (See Comments)    Severe headache  . Ibuprofen Other (See Comments)    Kidney dysfunction  . Oxycodone Hcl Nausea Only and Other (See Comments)    Headache  . Penicillins Nausea Only and Other (See Comments)    Severe headache Has patient had a PCN reaction causing immediate rash, facial/tongue/throat swelling, SOB or lightheadedness with hypotension: No Has patient had a PCN reaction causing severe rash involving mucus membranes or skin necrosis: No Has patient had a PCN reaction that required hospitalization: No Has patient had a PCN reaction occurring within the last 10 years: No If all of the above answers are "NO", then may proceed with Cephalosporin use.     Patient Measurements: Height: 4\' 10"  (147.3 cm) Weight: 149 lb 3.3 oz (67.7 kg) IBW/kg (Calculated) : 40.9 HEPARIN DW (KG): 55.2   Vital Signs: Temp: 98.4 F (36.9 C) (11/20 0608) Temp Source: Oral (11/20 0608) BP: 110/77 (11/20 0608) Pulse Rate: 82 (11/20 0608)  Labs: Recent Labs    05/13/17 0607 05/14/17 0544 05/15/17 0426  HGB 12.5  --  12.2  HCT 39.8  --  40.1  PLT 218  --  234  HEPARINUNFRC 0.70 0.56 0.19*  CREATININE 1.41* 1.41* 1.57*    Estimated Creatinine Clearance: 30.7 mL/min (A) (by C-G formula based on SCr of 1.57 mg/dL (H)).  Assessment: 61 yo lady on heparin for CP. Heparin level below goal this AM.  No bleeding noted, CBC stable.  Afib and Elevated troponin:  Likely transfer to [facility] today for cardiac cath.  Heparin infusing correctly per RN.  Goal of Therapy:  Heparin level 0.3-0.7 units/ml Monitor platelets by anticoagulation protocol: Yes   Plan:  Heparin load 2000 units. Increase heparin drip at 1100 units/hr Repeat heparin level in 6 hours if  not gone for cath Daily heparin level and CBC Monitor for bleeding complications  Pricilla Larsson, RPH  05/15/2017,7:40 AM

## 2017-05-15 NOTE — Progress Notes (Signed)
Progress Note  Patient Name: [name] Date of Encounter: 05/15/2017  Primary Cardiologist: Dr. Lovena Le  Subjective   No chest pain or palpitations. Breathing at baseline. Does note mild abdominal distension this morning with associated flatulence. Last BM was yesterday.   Inpatient Medications    Scheduled Meds: . allopurinol  100 mg Oral BID  . aspirin EC  81 mg Oral Daily  . atorvastatin  40 mg Oral q1800  . calcium carbonate  1,250 mg Oral BID WC  . cholecalciferol  1,000 Units Oral Daily  . diclofenac sodium  2 g Topical QID  . ferrous sulfate  325 mg Oral Q breakfast  . gabapentin  100 mg Oral TID  . heparin  2,000 Units Intravenous Once  . insulin aspart  0-5 Units Subcutaneous QHS  . insulin aspart  0-9 Units Subcutaneous TID WC  . levothyroxine  125 mcg Oral QAC breakfast  . magnesium oxide  400 mg Oral Daily  . mouth rinse  15 mL Mouth Rinse BID  . metoprolol succinate  100 mg Oral Daily  . pantoprazole  40 mg Oral BID  . potassium chloride  10 mEq Oral Daily  . sodium chloride flush  3 mL Intravenous Q12H   Continuous Infusions: . sodium chloride    . sodium chloride    . heparin 950 Units/hr (05/14/17 0303)   PRN Meds: sodium chloride, acetaminophen, albuterol, cyclobenzaprine, sodium chloride flush, temazepam   Vital Signs    Vitals:   05/14/17 0628 05/14/17 1325 05/14/17 2123 05/15/17 0608  BP: 110/72 108/66 110/69 110/77  Pulse: (!) 101 84 80 82  Resp: 18 18 18 18   Temp: 97.6 F (36.4 C) 98.3 F (36.8 C) 98.5 F (36.9 C) 98.4 F (36.9 C)  TempSrc: Oral Oral Oral Oral  SpO2: 95% 98% 97% 98%  Weight:    149 lb 3.3 oz (67.7 kg)  Height:        Intake/Output Summary (Last 24 hours) at 05/15/2017 0802 Last data filed at 05/15/2017 4270 Gross per 24 hour  Intake 594 ml  Output -  Net 594 ml   Filed Weights   05/13/17 1559 05/14/17 0500 05/15/17 0608  Weight: 143 lb 3.2 oz (65 kg) 136 lb (61.7 kg) 149 lb 3.3 oz (67.7 kg)     Telemetry    Atrial fibrillation, HR in 90's to low-100's.  - Personally Reviewed  ECG    No new tracings.   Physical Exam   General: Well developed, well nourished Caucasian female appearing in no acute distress. Head: Normocephalic, atraumatic.  Neck: Supple without bruits, JVD not elevated. Lungs:  Resp regular and unlabored, CTA without wheezing or rales. Heart: Irregular irregular, S1, S2, no S3, S4, or murmur; no rub. Abdomen: Soft, non-tender, non-distended with normoactive bowel sounds. No hepatomegaly. No rebound/guarding. No obvious abdominal masses. Extremities: No clubbing, cyanosis, or lower extremity edema. Distal pedal pulses are 2+ bilaterally. Neuro: Alert and oriented X 3. Moves all extremities spontaneously. Psych: Normal affect.  Labs    Chemistry Recent Labs  Lab 05/09/17 1010  05/13/17 0607 05/14/17 0544 05/15/17 0426  NA 133*   < > 137 138 136  K 3.5   < > 4.1 4.2 4.3  CL 100*   < > 101 102 101  CO2 21*   < > 26 26 25   GLUCOSE 194*   < > 119* 125* 123*  BUN 33*   < > 33* 29* 30*  CREATININE 1.68*   < >  1.41* 1.41* 1.57*  CALCIUM 9.1   < > 9.3 9.4 9.4  PROT 7.1  --   --   --   --   ALBUMIN 3.7  --   --   --   --   AST 53*  --   --   --   --   ALT 55*  --   --   --   --   ALKPHOS 105  --   --   --   --   BILITOT 0.8  --   --   --   --   GFRNONAA 32*   < > 39* 39* 35*  GFRAA 37*   < > 46* 46* 40*  ANIONGAP 12   < > 10 10 10    < > = values in this interval not displayed.     Hematology Recent Labs  Lab 05/12/17 0420 05/13/17 0607 05/15/17 0426  WBC 9.0 8.9 8.6  RBC 4.59 4.29 4.22  HGB 13.2 12.5 12.2  HCT 42.3 39.8 40.1  MCV 92.2 92.8 95.0  MCH 28.8 29.1 28.9  MCHC 31.2 31.4 30.4  RDW 16.7* 16.4* 16.8*  PLT 243 218 234    Cardiac Enzymes Recent Labs  Lab 05/09/17 1010 05/09/17 1511 05/09/17 2104 05/10/17 0424  TROPONINI 0.42* 0.37* 0.35* 0.33*   No results for input(s): TROPIPOC in the last 168 hours.   BNP Recent  Labs  Lab 05/09/17 1010  BNP 826.0*     DDimer  Recent Labs  Lab 05/09/17 1511  DDIMER 2.26*     Radiology    No results found.  Cardiac Studies   Echocardiogram: 05/09/2017 Study Conclusions  - Left ventricle: The cavity size was normal. Wall thickness was   increased in a pattern of mild LVH. The estimated ejection   fraction was 25%. There is akinesis of the basal-midinferolateral   and inferior myocardium. The study is not technically sufficient   to allow evaluation of LV diastolic function. - Aortic valve: Mildly calcified annulus. Trileaflet. There was   trivial regurgitation. - Mitral valve: Mildly calcified annulus. Mildly thickened leaflets   . There was mild regurgitation. - Left atrium: The atrium was severely dilated. - Right ventricle: Systolic function was moderately reduced. - Right atrium: The atrium was mildly dilated. Central venous   pressure (est): 3 mm Hg. - Tricuspid valve: There was mild-moderate regurgitation. - Pulmonary arteries: PA peak pressure: 45 mm Hg (S). - Pericardium, extracardiac: There was no pericardial effusion.  Impressions:  - Mild LVH with LVEF approximately 25% and akinesis of the mid to   basal inferior/inferolateral wall. Indeterminate diastolic   function. LVEF has decreased compared to the prior study from   June. Severe left atrial enlargement. Mild mitral annular   calcification with mild mitral regurgitation. Moderate RV   dysfunction. Mild to moderate tricuspid regurgitation with PASP   estimated 45 mmHg.  Patient Profile     61 y.o. female w/ PMHof persistent atrial fibrillation(declines anticoagulation), amiodarone induced pulmonary toxicity, chronic diastolic heart failure, HTN, HLD, and Type 2 DMwhopresented to [facility] ED on 05/09/2017 for evaluation of worsening dyspnea. Cardiology consulted for further evaluation.  Assessment & Plan    1. Progressivedyspnea on exertion/ Acute on Chronic Combined  Systolic and Diastolic CHF - presented with worsening dyspnea on exertion and orthopnea, found to have an elevated BNP of 826 and CXR showing pulmonary vascular congestion.  - diuresed well with IV Lasix as weight is down from 149 lbs  on admission to 136 lbs on 11/19 (recorded as 149 lbs this AM --> doubt accuracy of this). Further diuresis will be dictated by cath results.  - a repeat echocardiogram was obtained on 11/14 and showed a newly reduced EF of25%withakinesis of the basal-mid inferolateral and inferior myocardium.EF was previously preserved at 50-55% by echo in 11/2016 with no regional WMA noted at that time. - for [facility] later today. Orders have been entered including for IV Hydration. Called [facility] and she will transported directly to the cath lab as no telemetry beds are currently available.   2. Elevated troponin -cyclic values have been elevated at 0.42, 0.37, 0.35, and 0.33. - echo shows a reduced EF with WMA as above. She will need a cardiac catheterization for definitive evaluation in the setting of her newly reduced EF.  - remains on Heparin, statin, and BB therapy.  3. Hypertension - BP has been well-controlled at 108/66 - 110/77 within the past 24 hours.  - Losartan held until after cath. Continue Toprol-XL 100mg  daily (recently titrated on 11/19).  4. Persistent atrial fibrillation -HR remains elevated in the 90's to 110's, therefore will further titrate Toprol-XL to 100mg  daily. Avoid Cardizem in the setting of her reduced EF. Not on Amiodarone due to history of amiodarone toxicity.  - she was previously on Xarelto but this was held in August due to concern for a GIB and she has not been on anticoagulation since. Continue Heparin until catheterization has been performed then switch back to NOAC.   5. Hyperlipidemia - on Pravastatin PTA. Switched toLipitor 40 mgdaily as outlined in prior notes.  6. Hypothyroidism - TSH is within normal limits this  admission.  - continue Synthroid.  7. Type 2 diabetes mellitus - on SSI while admitted.   8. Stage 3 CKD - baseline creatinine 1.4 - 1.5. Stable at 1.57 today. Starting IV Hydration for cath.   9. Elevated D-dimer - at 2.26 on admission. Has not undergone a CTA due to her CKD.Lower extremity dopplers without evidence of a DVT.  - per admitting team.    For questions or updates, please contact [place] Please consult [email or web address] for contact info under Cardiology/STEMI.   Arna Medici , PA-C 8:02 AM 05/15/2017 Pager: [phone]  Attending note Patient seen and discussed with PA Ahmed Prima, I agree with her documentation above. Acute drop in LVEF, she is awaiting LHC/RHC today. We increased her toprol to 100mg  daily in setting of afib and elevated rates, continue to monitor. Remains on heparin, convert to oral anticoag after procedure. Plan for transfer and cath today.   Carlyle Dolly MD

## 2017-05-15 NOTE — Progress Notes (Signed)
Patient admitted to unit from cath lab post right and left heart cath, r femoral site level 0, right brachial level 0.  Patient alert and oriented x 4, denies pain.  VSS.  Brother at bedside.

## 2017-05-15 NOTE — Interval H&P Note (Signed)
Cath Lab Visit (complete for each Cath Lab visit)  Clinical Evaluation Leading to the Procedure:   ACS: No.  Non-ACS:    Anginal Classification: CCS III  Anti-ischemic medical therapy: Minimal Therapy (1 class of medications)  Non-Invasive Test Results: No non-invasive testing performed  Prior CABG: No previous CABG      History and Physical Interval Note:  05/15/2017 11:33 AM  [name]  has presented today for surgery, with the diagnosis of n stemi  The various methods of treatment have been discussed with the patient and family. After consideration of risks, benefits and other options for treatment, the patient has consented to  Procedure(s): RIGHT/LEFT HEART CATH AND CORONARY ANGIOGRAPHY (N/A) as a surgical intervention .  The patient's history has been reviewed, patient examined, no change in status, stable for surgery.  I have reviewed the patient's chart and labs.  Questions were answered to the patient's satisfaction.     Shelva Majestic

## 2017-05-16 ENCOUNTER — Inpatient Hospital Stay (HOSPITAL_COMMUNITY): Payer: PPO

## 2017-05-16 LAB — BASIC METABOLIC PANEL
ANION GAP: 9 (ref 5–15)
BUN: 23 mg/dL — ABNORMAL HIGH (ref 6–20)
CALCIUM: 9.1 mg/dL (ref 8.9–10.3)
CO2: 24 mmol/L (ref 22–32)
CREATININE: 1.4 mg/dL — AB (ref 0.44–1.00)
Chloride: 106 mmol/L (ref 101–111)
GFR calc non Af Amer: 40 mL/min — ABNORMAL LOW (ref >60)
GFR, EST AFRICAN AMERICAN: 46 mL/min — AB (ref >60)
Glucose, Bld: 113 mg/dL — ABNORMAL HIGH (ref 65–99)
Potassium: 4 mmol/L (ref 3.5–5.1)
SODIUM: 139 mmol/L (ref 135–145)

## 2017-05-16 LAB — LIPID PANEL
CHOLESTEROL: 78 mg/dL (ref 0–200)
HDL: 43 mg/dL (ref >40)
LDL Cholesterol: 22 mg/dL (ref 0–99)
TRIGLYCERIDES: 65 mg/dL (ref <150)
Total CHOL/HDL Ratio: 1.8 RATIO
VLDL: 13 mg/dL (ref 0–40)

## 2017-05-16 LAB — CBC
HCT: 38.9 % (ref 36.0–46.0)
Hemoglobin: 12 g/dL (ref 12.0–15.0)
MCH: 29.1 pg (ref 26.0–34.0)
MCHC: 30.8 g/dL (ref 30.0–36.0)
MCV: 94.2 fL (ref 78.0–100.0)
PLATELETS: 208 10*3/uL (ref 150–400)
RBC: 4.13 MIL/uL (ref 3.87–5.11)
RDW: 16.9 % — ABNORMAL HIGH (ref 11.5–15.5)
WBC: 7.8 10*3/uL (ref 4.0–10.5)

## 2017-05-16 LAB — GLUCOSE, CAPILLARY
GLUCOSE-CAPILLARY: 104 mg/dL — AB (ref 65–99)
Glucose-Capillary: 187 mg/dL — ABNORMAL HIGH (ref 65–99)
Glucose-Capillary: 177 mg/dL — ABNORMAL HIGH (ref 65–99)

## 2017-05-16 MED ORDER — TECHNETIUM TO 99M ALBUMIN AGGREGATED
4.2000 | Freq: Once | INTRAVENOUS | Status: AC | PRN
  Administered 2017-05-16: 4.2 via INTRAVENOUS

## 2017-05-16 MED ORDER — TECHNETIUM TC 99M DIETHYLENETRIAME-PENTAACETIC ACID
30.0000 | Freq: Once | INTRAVENOUS | Status: AC | PRN
  Administered 2017-05-16: 30 via RESPIRATORY_TRACT

## 2017-05-16 MED ORDER — HYDRALAZINE HCL 25 MG PO TABS
12.5000 mg | ORAL_TABLET | Freq: Three times a day (TID) | ORAL | Status: DC
  Administered 2017-05-16 – 2017-05-17 (×4): 12.5 mg via ORAL
  Filled 2017-05-16 (×4): qty 1

## 2017-05-16 MED ORDER — ISOSORBIDE MONONITRATE ER 30 MG PO TB24
15.0000 mg | ORAL_TABLET | Freq: Every day | ORAL | Status: DC
  Administered 2017-05-16 – 2017-05-17 (×2): 15 mg via ORAL
  Filled 2017-05-16 (×2): qty 1

## 2017-05-16 MED FILL — Verapamil HCl IV Soln 2.5 MG/ML: INTRAVENOUS | Qty: 2 | Status: AC

## 2017-05-16 NOTE — Consult Note (Addendum)
   [facility] CM Inpatient Consult   05/16/2017  [name] 1955-12-31 [id number]  Patient evaluated for community based chronic disease management services with [place] [facility] as a benefit of patient's [employer] plan. Spoke with patient at bedside to explain [place] services. Patient was admitted to [facility] and was transferred to [facility] hospital for cardiac catheterization after diagnosed with NSTEMI with Acute HF.  Consent form signed and copy of information given.  Patient drives herself states she just lost her job.  No current issues she states for resources.  Patient uses [employer].   Primary Care Provider:  Wende Neighbors, MD   Patient will receive post hospital discharge call and will be evaluated for monthly home visits for assessments and disease process education.  Left contact information and [facility] literature at bedside. Made Inpatient Case Manager aware that [place] Management following. Of note, [facility] Care Management services does not replace or interfere with any services that are arranged by inpatient case management or social work.  For additional questions or referrals please contact:    Tamara Brood, RN BSN [facility] Liaison  [phone] business mobile phone Toll free office [phone]

## 2017-05-16 NOTE — Progress Notes (Signed)
On review with patient she was on Xarelto until this summer  Taken off by [employer].  In [place]  Not clear why   Pt did have one fall I would recomm Resuming  I have tried to contact Dr Durene Cal office   Unable to reach. Await VQ results which may weigh even further into decision Dorris Carnes

## 2017-05-16 NOTE — Progress Notes (Signed)
RT NOTE:  Per MD note and previous RT note, patient does not tolerate CPAP. She transferred from [facility] and will request if she changes her mind and wants to wear. RT available if assistance needed.

## 2017-05-16 NOTE — Care Management Important Message (Signed)
Important Message  Patient Details  Name: [name] MRN: [id number] Date of Birth: Aug 25, 1955   Medicare Important Message Given:  Yes    Orbie Pyo 05/16/2017, 12:58 PM

## 2017-05-16 NOTE — Progress Notes (Addendum)
Progress Note  Patient Name: [name] Date of Encounter: 05/16/2017  Primary Cardiologist: Dr Lovena Le  Subjective   No SOB at rest but she has not been out of bed yet.   Inpatient Medications    Scheduled Meds: . allopurinol  100 mg Oral BID  . aspirin EC  81 mg Oral Daily  . atorvastatin  40 mg Oral q1800  . calcium carbonate  1,250 mg Oral BID WC  . cholecalciferol  1,000 Units Oral Daily  . diclofenac sodium  2 g Topical QID  . ferrous sulfate  325 mg Oral Q breakfast  . gabapentin  100 mg Oral TID  . heparin  5,000 Units Subcutaneous Q8H  . insulin aspart  0-5 Units Subcutaneous QHS  . insulin aspart  0-9 Units Subcutaneous TID WC  . levothyroxine  125 mcg Oral QAC breakfast  . magnesium oxide  400 mg Oral Daily  . mouth rinse  15 mL Mouth Rinse BID  . metoprolol succinate  100 mg Oral Daily  . pantoprazole  40 mg Oral BID  . potassium chloride  10 mEq Oral Daily  . sodium chloride flush  3 mL Intravenous Q12H  . sodium chloride flush  3 mL Intravenous Q12H   Continuous Infusions: . sodium chloride    . sodium chloride 50 mL/hr at 05/15/17 0806  . sodium chloride     PRN Meds: sodium chloride, sodium chloride, acetaminophen, albuterol, cyclobenzaprine, ondansetron (ZOFRAN) IV, sodium chloride flush, sodium chloride flush, temazepam   Vital Signs    Vitals:   05/15/17 1745 05/15/17 1837 05/15/17 2005 05/16/17 0405  BP:  119/81 116/85 118/88  Pulse:  98 82 98  Resp:   18 18  Temp:   98.2 F (36.8 C) 98.1 F (36.7 C)  TempSrc:   Oral Oral  SpO2:  97% 90% 96%  Weight: 142 lb 1 oz (64.4 kg)   142 lb 3.2 oz (64.5 kg)  Height: 4\' 10"  (1.473 m)       Intake/Output Summary (Last 24 hours) at 05/16/2017 1107 Last data filed at 05/16/2017 1034 Gross per 24 hour  Intake 735 ml  Output 100 ml  Net 635 ml   Filed Weights   05/15/17 0608 05/15/17 1745 05/16/17 0405  Weight: 149 lb 3.3 oz (67.7 kg) 142 lb 1 oz (64.4 kg) 142 lb 3.2 oz (64.5 kg)     Telemetry    AF with VR 100-110 - Personally Reviewed  ECG    None new - Personally Reviewed  Physical Exam   GEN: No acute distress.   Neck: No JVD Cardiac: irreg irreg, no murmurs, rubs, or gallops.  Respiratory: Clear to auscultation bilaterally. GI: Soft, nontender, non-distended  MS: No edema; No deformity. Neuro:  Nonfocal  Psych: Normal affect   Labs    Chemistry Recent Labs  Lab 05/14/17 0544 05/15/17 0426 05/15/17 1520 05/16/17 0457  NA 138 136  --  139  K 4.2 4.3  --  4.0  CL 102 101  --  106  CO2 26 25  --  24  GLUCOSE 125* 123*  --  113*  BUN 29* 30*  --  23*  CREATININE 1.41* 1.57* 1.39* 1.40*  CALCIUM 9.4 9.4  --  9.1  GFRNONAA 39* 35* 40* 40*  GFRAA 46* 40* 46* 46*  ANIONGAP 10 10  --  9     Hematology Recent Labs  Lab 05/15/17 0426 05/15/17 1520 05/16/17 0457  WBC 8.6 8.0 7.8  RBC 4.22 4.45 4.13  HGB 12.2 13.3 12.0  HCT 40.1 42.4 38.9  MCV 95.0 95.3 94.2  MCH 28.9 29.9 29.1  MCHC 30.4 31.4 30.8  RDW 16.8* 17.1* 16.9*  PLT 234 212 208    Cardiac Enzymes Recent Labs  Lab 05/09/17 1511 05/09/17 2104 05/10/17 0424  TROPONINI 0.37* 0.35* 0.33*   No results for input(s): TROPIPOC in the last 168 hours.   BNPNo results for input(s): BNP, PROBNP in the last 168 hours.   DDimer  Recent Labs  Lab 05/09/17 1511  DDIMER 2.26*     Radiology    CXR 05/09/17- CHEST  2 VIEW  COMPARISON:  Chest x-ray of February 23, 2017 and chest CT scan of March 08, 2017.  FINDINGS: The lungs are slightly less well inflated today especially on the right. The interstitial markings are coarse and more conspicuous today. The cardiac silhouette is enlarged. The pulmonary vascularity is engorged similar to that seen previously. There is calcification in the wall of the aortic arch and tortuosity of the descending thoracic aorta. There surgical clips in the upper paratracheal region. There is a small right pleural  effusion.  IMPRESSION: Chronic interstitial changes bilaterally and pulmonary vascular congestion accentuated by mild hypoinflation. Stable enlargement of cardiac silhouette. Small right pleural effusion, stable.  Thoracic aortic atherosclerosis.   Cardiac Studies   Rt and Lt heart cath 05/15/17- Nonischemic cardiomyopathy with recent echo documentation of ejection fraction of 25%.  LVEDP 13-14 mmHg.  Moderate right heart pressure elevation with PA pressure 50/18.  No significant coronary obstructive disease with mild 20% narrowing in the LAD between the first and second diagonal vessels and proximal to the initial septal perforating artery and large dominant RCA with 20% proximal and mid stenoses followed by ectasia in the region of the acute margin arch PDA and PLA system.  Echo 05/09/17- Impressions:  - Mild LVH with LVEF approximately 25% and akinesis of the mid to   basal inferior/inferolateral wall. Indeterminate diastolic   function. LVEF has decreased compared to the prior study from   June. Severe left atrial enlargement. Mild mitral annular   calcification with mild mitral regurgitation. Moderate RV   dysfunction. Mild to moderate tricuspid regurgitation with PASP   estimated 45 mmHg.  Patient Profile     61 y.o. female w/ PMHof persistent atrial fibrillation(declines anticoagulation), amiodarone induced pulmonary toxicity, chronic diastolic heart failure-(grade 2 with normal LVF by echo June 2018), HTN, HLD, and Type 2 DMwhopresented to [facility] ED on 05/09/2017 for evaluation of worsening dyspnea. Cardiology consulted for further evaluation.The pt's echo 05/09/17 showed new LVD with WMA- EF 25%. Her Troponin were elevated, peak 0.37. BNP and CXR c/w CHF. The pt was transferred to [facility] 05/15/17 for Rt and Lt heart cath and further evaluation.    Assessment & Plan    1. Acute on Chronic Combined Systolic and Diastolic CHF - presented with worsening dyspnea on  exertion and orthopnea, found to have an elevated BNP of 826 and CXR showing pulmonary vascular congestion.  -diuresed well with IV Lasix as weight is down from 149 lbs on admission to 136 lbs on 11/19  - a repeat echocardiogram was obtained on 11/14 and showed a newly reduced EF of25%withakinesis of the basal-mid inferolateral and inferior myocardium.EF was previously preserved at 50-55% by echo in 11/2016 with no regional WMA noted at that time.  ? Tachy induced  ? Viral    Continue b blocker and hydralazine  Add imdur  Will need f/u as outpt  Consider switch   2. Elevated troponin -cyclic values have been elevated at 0.42, 0.37, 0.35, and 0.33. - echo shows a reduced EF with WMA as above. -Cath 05/15/17 showed minor CAD 20% or less  3. Hypertension -BP has been well-controlled at 108/66 - 110/77 within the past 24 hours.  -Losartan held until after cath. Consider changing Toprol-XL 100mg  daily to coreg  4. Persistent atrial fibrillation -HRremains elevated in the 90's to 110's, Toprol-XL titrated to 100mg  on 11/19. Marland KitchenAvoid Cardizem in the setting of her reduced EF. Not on Amiodarone due to history of amiodarone toxicity. - she was previously on Xarelto but this was held in August due to concern for a GIB and she has not been on anticoagulation since.Consider starting  NOAC. CHA2Ds2 VASc= 4  5. Hyperlipidemia - on Pravastatin PTA.SwitchedtoLipitor 40 mgdaily as outlined in prior notes.  6. Hypothyroidism - TSH is within normal limits this admission.  - continue Synthroid.  7. Type 2 diabetes mellitus - on oral agents at home  8. Stage 3 CKD - baseline creatinine 1.4 - 1.5. Stable at 1.4 today post cath.   9. Elevated D-dimer - at 2.26 on admission. Has not undergone a CTA due to her CKD.Lower extremity dopplers without evidence of a DVT.   Plan: Symptomatically she has improved. Will review with MD. Not sure how to explain a D-dimer of 2.26. We  consider VQ scan. She will need to be anticoagulated anyway for AF. A diagnosis of PE would add weight to the argument to anticoagulate her.  Consider changing Toprol to Coreg, add nitrate, Hydralazine, ? And Aldactone for LVD.   Ambulate this am- ? Discharge later today. Arrange for f/u in [place] in 1-2 weeks.    For questions or updates, please contact [place] Please consult [email or web address] for contact info under Cardiology/STEMI.      Signed, Kerin Ransom, PA-C  05/16/2017, 11:07 AM    Patient seen and examined  Currently comfortable  On exam:  LUngs are CTA  Cardiac Irreg Irreg  No s3  Ext without edema Tele with afib 80s    Etiol for LV dysfunction not clear.  Question tachy induced  QUes viral  Plan for medical Rx  Metoprolol and Hydraazine /NTG F/U closely in clinic  Watch Na  Pt followed by Beckie Salts  Will need to address rhythm control vs rate control  Need to see why she is not on anticoag  Discuss with Z Hall her primeary.  Elevated D Dimer  VQ today  .   Dorris Carnes

## 2017-05-16 NOTE — Progress Notes (Signed)
Paged PA to inform of chest xray order needed for pt to completed nuc med perf and vent PA aware stated okay to place order

## 2017-05-16 NOTE — Progress Notes (Signed)
Pt ambulated 300 feet. Pt tolerated well. No assistive devices used

## 2017-05-17 ENCOUNTER — Encounter (HOSPITAL_COMMUNITY): Payer: Self-pay | Admitting: Nurse Practitioner

## 2017-05-17 DIAGNOSIS — I2510 Atherosclerotic heart disease of native coronary artery without angina pectoris: Secondary | ICD-10-CM

## 2017-05-17 LAB — GLUCOSE, CAPILLARY
Glucose-Capillary: 109 mg/dL — ABNORMAL HIGH (ref 65–99)
Glucose-Capillary: 115 mg/dL — ABNORMAL HIGH (ref 65–99)
Glucose-Capillary: 154 mg/dL — ABNORMAL HIGH (ref 65–99)

## 2017-05-17 LAB — BASIC METABOLIC PANEL
Anion gap: 9 (ref 5–15)
BUN: 22 mg/dL — AB (ref 6–20)
CALCIUM: 9 mg/dL (ref 8.9–10.3)
CO2: 23 mmol/L (ref 22–32)
CREATININE: 1.48 mg/dL — AB (ref 0.44–1.00)
Chloride: 103 mmol/L (ref 101–111)
GFR calc non Af Amer: 37 mL/min — ABNORMAL LOW (ref >60)
GFR, EST AFRICAN AMERICAN: 43 mL/min — AB (ref >60)
GLUCOSE: 118 mg/dL — AB (ref 65–99)
Potassium: 3.9 mmol/L (ref 3.5–5.1)
Sodium: 135 mmol/L (ref 135–145)

## 2017-05-17 MED ORDER — ISOSORBIDE MONONITRATE ER 30 MG PO TB24: 15.0000 mg | ORAL_TABLET | Freq: Every day | ORAL | 3 refills | Status: AC

## 2017-05-17 MED ORDER — METOPROLOL SUCCINATE ER 100 MG PO TB24: 100.0000 mg | ORAL_TABLET | Freq: Every day | ORAL | 6 refills | Status: DC

## 2017-05-17 MED ORDER — RIVAROXABAN 15 MG PO TABS: 15.0000 mg | ORAL_TABLET | Freq: Every day | ORAL | 6 refills | Status: DC

## 2017-05-17 MED ORDER — RIVAROXABAN 15 MG PO TABS
15.0000 mg | ORAL_TABLET | Freq: Every day | ORAL | Status: DC
  Administered 2017-05-17: 15 mg via ORAL
  Filled 2017-05-17: qty 1

## 2017-05-17 MED ORDER — HYDRALAZINE HCL 25 MG PO TABS: 12.5000 mg | ORAL_TABLET | Freq: Three times a day (TID) | ORAL | 6 refills | Status: AC

## 2017-05-17 NOTE — Discharge Instructions (Signed)
**  PLEASE REMEMBER TO BRING ALL OF YOUR MEDICATIONS TO EACH OF YOUR FOLLOW-UP OFFICE VISITS.  Radial Site Care Refer to this sheet in the next few weeks. These instructions provide you with information on caring for yourself after your procedure. Your caregiver may also give you more specific instructions. Your treatment has been planned according to current medical practices, but problems sometimes occur. Call your caregiver if you have any problems or questions after your procedure. HOME CARE INSTRUCTIONS  You may shower the day after the procedure.Remove the bandage (dressing) and gently wash the site with plain soap and water.Gently pat the site dry.   Do not apply powder or lotion to the site.   Do not submerge the affected site in water for 3 to 5 days.   Inspect the site at least twice daily.   Do not flex or bend the affected arm for 24 hours.   No lifting over 5 pounds (2.3 kg) for 5 days after your procedure.   Do not drive home if you are discharged the same day of the procedure. Have someone else drive you.   You may drive 24 hours after the procedure unless otherwise instructed by your caregiver.  What to expect:  Any bruising will usually fade within 1 to 2 weeks.   Blood that collects in the tissue (hematoma) may be painful to the touch. It should usually decrease in size and tenderness within 1 to 2 weeks.  SEEK IMMEDIATE MEDICAL CARE IF:  You have unusual pain at the radial site.   You have redness, warmth, swelling, or pain at the radial site.   You have drainage (other than a small amount of blood on the dressing).   You have chills.   You have a fever or persistent symptoms for more than 72 hours.   You have a fever and your symptoms suddenly get worse.   Your arm becomes pale, cool, tingly, or numb.   You have heavy bleeding from the site. Hold pressure on the site.      10 Habits of Highly Healthy [place] wants to help you get well and  stay well.  Live a longer, healthier life by practicing healthy habits every day.  1.  Visit your primary care provider regularly. 2.  Make time for family and friends.  Healthy relationships are important. 3.  Take medications as directed by your provider. 4.  Maintain a healthy weight and a trim waistline. 5.  Eat healthy meals and snacks, rich in fruits, vegetables, whole grains, and lean proteins. 6.  Get moving every day - aim for 150 minutes of moderate physical activity each week. 7.  Don't smoke. 8.  Avoid alcohol or drink in moderation. 9.  Manage stress through meditation or mindful relaxation. 10.  Get seven to nine hours of quality sleep each night.  Want more information on healthy habits?  To learn more about these and other healthy habits, visit [email or web address]. _____________

## 2017-05-17 NOTE — Progress Notes (Signed)
.   Progress Note  Patient Name: [name] Date of Encounter: 05/17/2017  Primary Cardiologist: Dr Lovena Le  Subjective   Breathing OK  NO CP    Inpatient Medications    Scheduled Meds: . allopurinol  100 mg Oral BID  . aspirin EC  81 mg Oral Daily  . atorvastatin  40 mg Oral q1800  . calcium carbonate  1,250 mg Oral BID WC  . cholecalciferol  1,000 Units Oral Daily  . diclofenac sodium  2 g Topical QID  . ferrous sulfate  325 mg Oral Q breakfast  . gabapentin  100 mg Oral TID  . heparin  5,000 Units Subcutaneous Q8H  . hydrALAZINE  12.5 mg Oral Q8H  . insulin aspart  0-5 Units Subcutaneous QHS  . insulin aspart  0-9 Units Subcutaneous TID WC  . isosorbide mononitrate  15 mg Oral Daily  . levothyroxine  125 mcg Oral QAC breakfast  . magnesium oxide  400 mg Oral Daily  . mouth rinse  15 mL Mouth Rinse BID  . metoprolol succinate  100 mg Oral Daily  . pantoprazole  40 mg Oral BID  . potassium chloride  10 mEq Oral Daily  . sodium chloride flush  3 mL Intravenous Q12H  . sodium chloride flush  3 mL Intravenous Q12H   Continuous Infusions: . sodium chloride    . sodium chloride 50 mL/hr at 05/15/17 0806  . sodium chloride     PRN Meds: sodium chloride, sodium chloride, acetaminophen, albuterol, cyclobenzaprine, ondansetron (ZOFRAN) IV, sodium chloride flush, sodium chloride flush, temazepam   Vital Signs    Vitals:   05/16/17 1207 05/16/17 1920 05/16/17 2304 05/17/17 0551  BP: 105/88 102/74  99/79  Pulse: 86 85 85 73  Resp: 18 18 16 18   Temp: 97.8 F (36.6 C) 98.5 F (36.9 C)  97.7 F (36.5 C)  TempSrc: Oral Oral  Oral  SpO2: 97% 98% 98% 98%  Weight:    144 lb 12.8 oz (65.7 kg)  Height:        Intake/Output Summary (Last 24 hours) at 05/17/2017 0855 Last data filed at 05/17/2017 0600 Gross per 24 hour  Intake 960 ml  Output 1300 ml  Net -340 ml   Filed Weights   05/15/17 1745 05/16/17 0405 05/17/17 0551  Weight: 142 lb 1 oz (64.4 kg) 142 lb 3.2  oz (64.5 kg) 144 lb 12.8 oz (65.7 kg)    Telemetry    Atrial fib  90s  - Personally Reviewed  ECG    Physical Exam   GEN: No acute distress.   Neck: JVP normal   Cardiac: irreg irreg, no murmurs, No s3  No rubs, or gallops.  Respiratory: Clear to auscultation bilaterally. GI: Soft, nontender, non-distended  MS: No edema; No deformity. Neuro:  Nonfocal  Psych: Normal affect   Labs    Chemistry Recent Labs  Lab 05/15/17 0426 05/15/17 1520 05/16/17 0457 05/17/17 0333  NA 136  --  139 135  K 4.3  --  4.0 3.9  CL 101  --  106 103  CO2 25  --  24 23  GLUCOSE 123*  --  113* 118*  BUN 30*  --  23* 22*  CREATININE 1.57* 1.39* 1.40* 1.48*  CALCIUM 9.4  --  9.1 9.0  GFRNONAA 35* 40* 40* 37*  GFRAA 40* 46* 46* 43*  ANIONGAP 10  --  9 9     Hematology Recent Labs  Lab 05/15/17 0426 05/15/17  1520 05/16/17 0457  WBC 8.6 8.0 7.8  RBC 4.22 4.45 4.13  HGB 12.2 13.3 12.0  HCT 40.1 42.4 38.9  MCV 95.0 95.3 94.2  MCH 28.9 29.9 29.1  MCHC 30.4 31.4 30.8  RDW 16.8* 17.1* 16.9*  PLT 234 212 208    Cardiac Enzymes No results for input(s): TROPONINI in the last 168 hours. No results for input(s): TROPIPOC in the last 168 hours.   BNPNo results for input(s): BNP, PROBNP in the last 168 hours.   DDimer  No results for input(s): DDIMER in the last 168 hours.   Radiology    CXR 05/09/17- CHEST  2 VIEW  COMPARISON:  Chest x-ray of February 23, 2017 and chest CT scan of March 08, 2017.  FINDINGS: The lungs are slightly less well inflated today especially on the right. The interstitial markings are coarse and more conspicuous today. The cardiac silhouette is enlarged. The pulmonary vascularity is engorged similar to that seen previously. There is calcification in the wall of the aortic arch and tortuosity of the descending thoracic aorta. There surgical clips in the upper paratracheal region. There is a small right pleural effusion.  IMPRESSION: Chronic  interstitial changes bilaterally and pulmonary vascular congestion accentuated by mild hypoinflation. Stable enlargement of cardiac silhouette. Small right pleural effusion, stable.  Thoracic aortic atherosclerosis.   Cardiac Studies   Rt and Lt heart cath 05/15/17- Nonischemic cardiomyopathy with recent echo documentation of ejection fraction of 25%.  LVEDP 13-14 mmHg.  Moderate right heart pressure elevation with PA pressure 50/18.  No significant coronary obstructive disease with mild 20% narrowing in the LAD between the first and second diagonal vessels and proximal to the initial septal perforating artery and large dominant RCA with 20% proximal and mid stenoses followed by ectasia in the region of the acute margin arch PDA and PLA system.  Echo 05/09/17- Impressions:  - Mild LVH with LVEF approximately 25% and akinesis of the mid to   basal inferior/inferolateral wall. Indeterminate diastolic   function. LVEF has decreased compared to the prior study from   June. Severe left atrial enlargement. Mild mitral annular   calcification with mild mitral regurgitation. Moderate RV   dysfunction. Mild to moderate tricuspid regurgitation with PASP   estimated 45 mmHg.  Patient Profile     61 y.o. female w/ PMHof persistent atrial fibrillation(declines anticoagulation), amiodarone induced pulmonary toxicity, chronic diastolic heart failure-(grade 2 with normal LVF by echo June 2018), HTN, HLD, and Type 2 DMwhopresented to [facility] on 05/09/2017 for evaluation of worsening dyspnea. Cardiology consulted for further evaluation.The pt's echo 05/09/17 showed new LVD with WMA- EF 25%. Her Troponin were elevated, peak 0.37. BNP and CXR c/w CHF. The pt was transferred to [facility] 05/15/17 for Rt and Lt heart cath and further evaluation.    Assessment & Plan    1. Acute on Chronic Combined Systolic and Diastolic CHF -May be tachycardia induced  WIll work on control of afib rates Need to  watch salt, I/O  Wt Follow up closely as outpt   APpt in [place] next week with gen cardiology  Pt has appt already for Dec with Beckie Salts   Continue b blocker and hydralazine  Add imdur     2. Elevated troponin Cath with minimal CAD   3. Hypertension -BP OK   4. Persistent atrial fibrillation  Need rate control  -HRremains elevated in the 90's, Toprol-XL titrated to 100mg  on 11/19. Marland KitchenAvoid Cardizem in the setting of her reduced EF.  Not on Amiodarone due to history of amiodarone toxicity. - she was previously on Xarelto but this was held in August due to concern for a GIB and she has not been on anticoagulation since.Consider starting  NOAC. CHA2Ds2 VASc= 4 Will restart at 15 mg per day   Stop ASA   Follow as outpt    5. Hyperlipidemia - on Pravastatin PTA.SwitchedtoLipitor 40 mgdaily as outlined in prior notes.  6. Hypothyroidism - TSH is within normal limits this admission.  - continue Synthroid.  7. Type 2 diabetes mellitus - on oral agents at home  8. Stage 3 CKD - baseline creatinine 1.4 - 1.5. Stable at 1.4 today post cath.   9. Elevated D-dimer - at 2.26 on admission. V/Q is negative   Arrange for f/u in [place] in 1 week.    For questions or updates, please contact [place] Please consult [email or web address] for contact info under Cardiology/STEMI.      Signed, Dorris Carnes, MD  05/17/2017, 8:55 AM    Patient seen and examined  Currently comfortable  On exam:  LUngs are CTA  Cardiac Irreg Irreg  No s3  Ext without edema Tele with afib 80s    Etiol for LV dysfunction not clear.  Question tachy induced  QUes viral  Plan for medical Rx  Metoprolol and Hydraazine /NTG F/U closely in clinic  Watch Na  Pt followed by Beckie Salts  Will need to address rhythm control vs rate control  Need to see why she is not on anticoag  Discuss with Z Hall her primeary.  Elevated D Dimer  VQ today  .   Dorris Carnes

## 2017-05-17 NOTE — Discharge Summary (Signed)
Discharge Summary    Patient ID: [name],  MRN: [id number], DOB/AGE: 09/21/1955 60 y.o.  Admit date: 05/09/2017 Discharge date: 05/17/2017  Primary Care Provider: Celene Squibb Primary Cardiologist: Darnell Level. Lovena Le, MD   Discharge Diagnoses    Principal Problem:   Acute on chronic combined systolic and diastolic CHF (congestive heart failure) (HCC)  **discharge wt of 144 lbs. Active Problems:   Atrial fibrillation with RVR (HCC)  **CHA2DS2VASc = 4  renal dose xarelto started.  **Toprol XL titrated to 100 mg Daily   Persistent atrial fibrillation (HCC)   Non-ischemic cardiomyopathy (HCC)  **EF 25% by echo this admission.   DM (diabetes mellitus) ([place])   CKD (chronic kidney disease), stage III (HCC)   Elevated troponin   Non-obstructive CAD (coronary artery disease)   Varicose veins of bilateral lower extremities with other complications   OSA (obstructive sleep apnea)   IDA (iron deficiency anemia)   ILD (interstitial lung disease) (HCC)  Allergies Allergies  Allergen Reactions  . Flecainide Nausea Only and Other (See Comments)    Faint feeling  . Hydrocodone-Acetaminophen Nausea Only and Other (See Comments)    Severe headache  . Ibuprofen Other (See Comments)    Kidney dysfunction  . Oxycodone Hcl Nausea Only and Other (See Comments)    Headache  . Penicillins Nausea Only and Other (See Comments)    Severe headache Has patient had a PCN reaction causing immediate rash, facial/tongue/throat swelling, SOB or lightheadedness with hypotension: No Has patient had a PCN reaction causing severe rash involving mucus membranes or skin necrosis: No Has patient had a PCN reaction that required hospitalization: No Has patient had a PCN reaction occurring within the last 10 years: No If all of the above answers are "NO", then may proceed with Cephalosporin use.     Diagnostic Studies/Procedures    2D Echocardiogram 11.14.2018  Study Conclusions   - Left  ventricle: The cavity size was normal. Wall thickness was   increased in a pattern of mild LVH. The estimated ejection   fraction was 25%. There is akinesis of the basal-midinferolateral   and inferior myocardium. The study is not technically sufficient   to allow evaluation of LV diastolic function. - Aortic valve: Mildly calcified annulus. Trileaflet. There was   trivial regurgitation. - Mitral valve: Mildly calcified annulus. Mildly thickened leaflets   . There was mild regurgitation. - Left atrium: The atrium was severely dilated. - Right ventricle: Systolic function was moderately reduced. - Right atrium: The atrium was mildly dilated. Central venous   pressure (est): 3 mm Hg. - Tricuspid valve: There was mild-moderate regurgitation. - Pulmonary arteries: PA peak pressure: 45 mm Hg (S). - Pericardium, extracardiac: There was no pericardial effusion. _____________  Cardiac Catheterization 11.20.2018  Coronary Findings   Diagnostic  Dominance: Right  Left Anterior Descending  Prox LAD lesion 20% stenosed  Prox LAD lesion is 20% stenosed.  Right Coronary Artery  Prox RCA lesion 20% stenosed  Prox RCA lesion is 20% stenosed.  Mid RCA lesion 20% stenosed  Mid RCA lesion is 20% stenosed.   Right Heart   Right Heart Pressures RA pressure A wave 9, V wave 16, mean 10 RV pressure: 47/8 PA pressure 49/20; mean 33 PW pressure mean 19  AO: 120/83 PA: 50/18  LV: 120/13-14 PW: Mean 21  LV pressure 112/15 Aortic pressure 114/78  Oxygen saturation in the aorta 92% and in the PA 60%.  Cardiac output by the thermodilution method 3.9  L/m and by the Fick method 3.9 L/m Cardiac index by both methods was 2.5 L/m/m.  _____________   History of Present Illness     61 y/o ? with a h/o persistent Afib, amio induced pulmonary toxicity, chronic diastolic heart failure, and sleep apnea, who was recently admitted to [facility] in the setting of progressive exertional dyspnea  and orthopnea.  Chest x-ray showed chronic interstitial changes bilaterally and pulmonary vascular congestion.  She was seen by cardiology and felt to be volume overloaded and placed on intravenous Lasix.  Follow-up echocardiogram showed new LV dysfunction with an EF of 25% with basal-mid inferolateral and inferior akinesis.  She was also in atrial fibrillation with elevated right requiring titration of beta-blocker therapy.  Troponin was mildly elevated with a peak of 0.42 and relatively flat trend.  With diuresis, she did have elevation of the renal function stabilized and it was felt that she would require diagnostic catheterization.  In that setting, she was transferred to [facility] for further evaluation.  Hospital Course     Consultants: None   [name] underwent right and left heart diagnostic catheterization on May 15, 2017 revealing minimal, nonobstructive CAD.  Right heart pressures are mildly elevated though specifically, her pulmonary pressure was elevated at 49/20.  No intervention was required and clinically, she was not felt to require any additional Lasix.  Attention was turned to maximizing medications.  In the setting of stage III chronic kidney disease, she was placed on hydralazine and nitrate therapy and beta-blocker therapy was titrated in the setting of ongoing tachycardia.  With improved rate control, and heart rate in the 90s, she did have clinical improvement.  It was noted that she had previously been on Xarelto but this was held earlier this year in the setting of concerns related to GI bleed.  In the setting of a CHA2DS2VASc of 4 and stable, normal hemoglobin and hematocrit, she has been placed on Xarelto 15 mg daily, with the lower dose being chosen in the setting of a creatinine clearance less than 50.  Of note, d-dimer on November 14 was noted to be elevated at 2.26.  She did have a negative lower extremity ultrasound at [facility].  With ongoing dyspnea  during her time at [facility], VQ scan was obtained and was low probability.  [name] will be discharged home today in good condition.  We will arrange for follow-up in a reasonable office within the next week she has follow-up with Dr. Lovena Le in December to further address management of her atrial fibrillation. _____________  Discharge Vitals Blood pressure 99/79, pulse 73, temperature 97.7 F (36.5 C), temperature source Oral, resp. rate 18, height 4\' 10"  (1.473 m), weight 144 lb 12.8 oz (65.7 kg), SpO2 98 %.  Filed Weights   05/15/17 1745 05/16/17 0405 05/17/17 0551  Weight: 142 lb 1 oz (64.4 kg) 142 lb 3.2 oz (64.5 kg) 144 lb 12.8 oz (65.7 kg)    Labs & Radiologic Studies    CBC Recent Labs    05/15/17 1520 05/16/17 0457  WBC 8.0 7.8  HGB 13.3 12.0  HCT 42.4 38.9  MCV 95.3 94.2  PLT 212 983   Basic Metabolic Panel Recent Labs    05/16/17 0457 05/17/17 0333  NA 139 135  K 4.0 3.9  CL 106 103  CO2 24 23  GLUCOSE 113* 118*  BUN 23* 22*  CREATININE 1.40* 1.48*  CALCIUM 9.1 9.0   Liver Function Tests Lab  Results  Component Value Date   ALT 55 (H) 05/09/2017   AST 53 (H) 05/09/2017   ALKPHOS 105 05/09/2017   BILITOT 0.8 05/09/2017    Cardiac Enzymes Lab Results  Component Value Date   TROPONINI 0.33 ([place]) 05/10/2017    Fasting Lipid Panel Recent Labs    05/16/17 0457  CHOL 78  HDL 43  LDLCALC 22  TRIG 65  CHOLHDL 1.8   Thyroid Function Tests Lab Results  Component Value Date   TSH 1.676 05/09/2017    _____________  Dg Chest 2 View  Result Date: 05/16/2017 CLINICAL DATA:  Congestive heart failure. Prior to cough and shortness of breath for 1 week. EXAM: CHEST  2 VIEW COMPARISON:  05/09/2017 FINDINGS: Mildly degraded lateral view secondary to artifact posteriorly. Surgical clips in the low neck. Probable enchondroma or infarct in the proximal right humerus. Midline trachea. Moderate cardiomegaly. Mediastinal contours otherwise within normal  limits. No pleural effusion or pneumothorax. Mild lower lobe predominant interstitial thickening is not significantly changed. No overt congestive failure or lobar consolidation. IMPRESSION: Cardiomegaly and mild chronic interstitial thickening. No acute findings. Electronically Signed   By: Abigail Miyamoto M.D.   On: 05/16/2017 19:16   Dg Chest 2 View  Result Date: 05/09/2017 CLINICAL DATA:  Cough and chest congestion with shortness of breath over the past 2 days. History of congenital heart disease, chronic bronchitis, diabetes, never smoked. EXAM: CHEST  2 VIEW COMPARISON:  Chest x-ray of February 23, 2017 and chest CT scan of March 08, 2017. FINDINGS: The lungs are slightly less well inflated today especially on the right. The interstitial markings are coarse and more conspicuous today. The cardiac silhouette is enlarged. The pulmonary vascularity is engorged similar to that seen previously. There is calcification in the wall of the aortic arch and tortuosity of the descending thoracic aorta. There surgical clips in the upper paratracheal region. There is a small right pleural effusion. IMPRESSION: Chronic interstitial changes bilaterally and pulmonary vascular congestion accentuated by mild hypoinflation. Stable enlargement of cardiac silhouette. Small right pleural effusion, stable. Thoracic aortic atherosclerosis. Electronically Signed   By: David  Martinique M.D.   On: 05/09/2017 10:36   Nm Pulmonary Perf And Vent  Result Date: 05/16/2017 CLINICAL DATA:  Dyspnea.  CHF and elevated D-dimer. EXAM: NUCLEAR MEDICINE VENTILATION - PERFUSION LUNG SCAN TECHNIQUE: Ventilation images were obtained in multiple projections using inhaled aerosol Tc-7m DTPA. Perfusion images were obtained in multiple projections after intravenous injection of Tc-79m MAA. RADIOPHARMACEUTICALS:  Thirty mCi Technetium-24m DTPA aerosol inhalation and 4.2 mCi Technetium-86m MAA IV COMPARISON:  Chest x-ray from the same day FINDINGS:  There are defects on AP view from cardiomegaly and lateral right chest wall deformity/pleural thickening. On the LPO, there is a moderate pleural-based pefusion defect at the lower lobe that is not clearly seen on the other views, suspect this is related to a Bochdalek's hernia seen on 01/04/2017 abdominal CT and comparison chest x-ray. As well, this is likely matched, as permitted by heterogeneous airway distribution in this patient with history of chronic bronchitis. Other apparent defects are attributable to mediastinal/hilar structures. IMPRESSION: Low probability for pulmonary embolism by PIOPED 2. Electronically Signed   By: Monte Fantasia M.D.   On: 05/16/2017 18:56   US Venous Img Lower Bilateral  Result Date: 05/10/2017 CLINICAL DATA:  Bilateral lower extremity edema with occasional shortness of breath. Elevated D-dimer. Evaluate for DVT. EXAM: BILATERAL LOWER EXTREMITY VENOUS DOPPLER ULTRASOUND TECHNIQUE: Gray-scale sonography with graded compression, as well as  color Doppler and duplex ultrasound were performed to evaluate the lower extremity deep venous systems from the level of the common femoral vein and including the common femoral, femoral, profunda femoral, popliteal and calf veins including the posterior tibial, peroneal and gastrocnemius veins when visible. The superficial great saphenous vein was also interrogated. Spectral Doppler was utilized to evaluate flow at rest and with distal augmentation maneuvers in the common femoral, femoral and popliteal veins. COMPARISON:  None. FINDINGS: RIGHT LOWER EXTREMITY Common Femoral Vein: No evidence of thrombus. Normal compressibility, respiratory phasicity and response to augmentation. Saphenofemoral Junction: No evidence of thrombus. Normal compressibility and flow on color Doppler imaging. Profunda Femoral Vein: No evidence of thrombus. Normal compressibility and flow on color Doppler imaging. Femoral Vein: No evidence of thrombus. Normal  compressibility, respiratory phasicity and response to augmentation. Popliteal Vein: No evidence of thrombus. Normal compressibility, respiratory phasicity and response to augmentation. Calf Veins: No evidence of thrombus. Normal compressibility and flow on color Doppler imaging. Superficial Great Saphenous Vein: No evidence of thrombus. Normal compressibility. Venous Reflux:  None. Other Findings:  None. LEFT LOWER EXTREMITY Common Femoral Vein: No evidence of thrombus. Normal compressibility, respiratory phasicity and response to augmentation. Saphenofemoral Junction: No evidence of thrombus. Normal compressibility and flow on color Doppler imaging. Profunda Femoral Vein: No evidence of thrombus. Normal compressibility and flow on color Doppler imaging. Femoral Vein: No evidence of thrombus. Normal compressibility, respiratory phasicity and response to augmentation. Popliteal Vein: No evidence of thrombus. Normal compressibility, respiratory phasicity and response to augmentation. Calf Veins: No evidence of thrombus. Normal compressibility and flow on color Doppler imaging. Superficial Great Saphenous Vein: No evidence of thrombus. Normal compressibility. Venous Reflux:  None. Other Findings:  None. IMPRESSION: No evidence of DVT within either lower extremity. Electronically Signed   By: Sandi Mariscal M.D.   On: 05/10/2017 09:57   Disposition   Pt is being discharged home today in good condition.  Follow-up Plans & Appointments    Follow-up Information    Lendon Colonel, NP Follow up in 1 week(s).   Specialties:  Nurse Practitioner, Radiology, Cardiology Why:  we will arrange and contact you Contact information: [place] [address] [phone]        Evans Lance, MD Follow up on 06/11/2017.   Specialty:  Cardiology Why:  10 AM Contact information: [place] [address] [phone]          Discharge Instructions    AMB Referral to [facility]  Management   Complete by:  As directed    Please assign to telephonic RN Care Coordinator for short term TOC and determine needs further.  New HF in the [employer] plan.  Thanks.  For questions, please contact:  Natividad Brood, RN BSN [facility] Liaison  [phone] business mobile phone Toll free office [phone]   Reason for consult:  New Heart Failure - short term transition of care call   Expected date of contact:  1-3 days (reserved for hospital discharges)      Discharge Medications   Current Discharge Medication List    START taking these medications   Details  hydrALAZINE (APRESOLINE) 25 MG tablet Take 0.5 tablets (12.5 mg total) by mouth every 8 (eight) hours. Qty: 45 tablet, Refills: 6    isosorbide mononitrate (IMDUR) 30 MG 24 hr tablet Take 0.5 tablets (15 mg total) by mouth daily. Qty: 30 tablet, Refills: 3    Rivaroxaban (XARELTO) 15 MG  TABS tablet Take 1 tablet (15 mg total) by mouth daily. Qty: 30 tablet, Refills: 6      CONTINUE these medications which have CHANGED   Details  metoprolol succinate (TOPROL-XL) 100 MG 24 hr tablet Take 1 tablet (100 mg total) by mouth daily. Qty: 30 tablet, Refills: 6      CONTINUE these medications which have NOT CHANGED   Details  albuterol (PROVENTIL HFA;VENTOLIN HFA) 108 (90 Base) MCG/ACT inhaler Inhale 2 puffs into the lungs every 6 (six) hours as needed for wheezing or shortness of breath. Qty: 1 Inhaler, Refills: 3    allopurinol (ZYLOPRIM) 100 MG tablet Take 100 mg by mouth 2 (two) times daily.     calcium carbonate (OS-CAL) 600 MG TABS Take 600 mg by mouth 2 (two) times daily with a meal.      cholecalciferol (VITAMIN D) 1000 UNITS tablet Take 1,000 Units by mouth daily.    cyclobenzaprine (FLEXERIL) 5 MG tablet Take 1-2 tabs by mouth up to three times daily for muscle spasms. Qty: 30 tablet, Refills: 0    ferrous sulfate 325 (65 FE) MG tablet Take 325 mg by mouth daily with  breakfast.    furosemide (LASIX) 20 MG tablet TAKE ONE TABLET BY MOUTH DAILY AS NEEDED. Qty: 30 tablet, Refills: 3    gabapentin (NEURONTIN) 100 MG capsule Take 1 capsule 3 (three) times daily by mouth.    GLIPIZIDE XL 5 MG 24 hr tablet Take 5 mg by mouth daily before breakfast.    levothyroxine (SYNTHROID, LEVOTHROID) 125 MCG tablet Take 125 mcg by mouth daily before breakfast.    loperamide (IMODIUM) 2 MG capsule Take 2-4 mg by mouth 4 (four) times daily as needed for diarrhea or loose stools.    Magnesium 250 MG TABS Take 250 mg by mouth daily.     Omega-3 Fatty Acids (FISH OIL) 1200 MG CAPS Take 1,200 mg by mouth 2 (two) times daily.    pantoprazole (PROTONIX) 40 MG tablet Take 40 mg by mouth 2 (two) times daily.     polyethylene glycol (MIRALAX / GLYCOLAX) packet Take 17 g by mouth daily as needed (for constipation.).     potassium chloride (K-DUR) 10 MEQ tablet Take 10 mEq by mouth daily as needed. WITH LASIX (FLUID RETENTION) ~EVERY OTHER DAY    pravastatin (PRAVACHOL) 10 MG tablet Take 10 mg by mouth daily.    Probiotic Product (ALIGN PO) Take 1 tablet by mouth daily.     sitaGLIPtin (JANUVIA) 50 MG tablet Take 50 mg by mouth daily.    temazepam (RESTORIL) 15 MG capsule Take 1 capsule at bedtime by mouth.    acetaminophen (TYLENOL) 500 MG tablet Take 1,000 mg by mouth every 6 (six) hours as needed (for pain.).       STOP taking these medications     losartan (COZAAR) 50 MG tablet          Aspirin prescribed at discharge?  No: Pt going home on xarelto High Intensity Statin Prescribed? (Lipitor 40-80mg  or Crestor 20-40mg ): No: LDL 22 on home dose of Pravachol Beta Blocker Prescribed? Yes For EF <40%, was ACEI/ARB Prescribed? No: CKD III--> Hydralazine/Nitrate added. ADP Receptor Inhibitor Prescribed? (i.e. Plavix etc.-Includes Medically Managed Patients): No: N/A For EF <40%, Aldosterone Inhibitor Prescribed? No: CKD III Was EF assessed during THIS  hospitalization? Yes Was Cardiac Rehab II ordered? (Included Medically managed Patients): Yes   Outstanding Labs/Studies   F/u CBC/BMET @ office f/u.  Duration of Discharge Encounter  Greater than 30 minutes including physician time.  Signed, Murray Hodgkins NP 05/17/2017, 12:13 PM

## 2017-05-17 NOTE — Progress Notes (Signed)
Pt IV discontinued, catheter intact and telemetry removed. Pt has all belongings including glasses and hearing aids. Pt discharge education provided at bedside with family. Pt discharged via wheelchair

## 2017-05-18 ENCOUNTER — Other Ambulatory Visit: Payer: Self-pay

## 2017-05-18 NOTE — Patient Outreach (Signed)
[facility]) Care Management  05/18/2017  BRIGETTE HOPFER 1956-05-09 [id number]   Telephone call to patient for transition of care call.  No answer.  HIPAA compliant voice message left.    Plan: RN CM will attempt patient again within 3 business days.    [name], RN, MSN [facility] Care Management Care Management Coordinator Direct Line [phone] Toll Free: [phone]  Fax: [phone]

## 2017-05-21 ENCOUNTER — Other Ambulatory Visit: Payer: Self-pay

## 2017-05-21 NOTE — Patient Outreach (Signed)
[facility]) Care Management  [place]  05/21/2017   [name] 10-22-1955 [id number]  Subjective: Telephone call to patient for transition on care call.  Patient answered and is able to verify HIPAA. Patient reports she is doing ok but still has some congestion. Patient reports she is taking Allerga-D. Cautioned patient about taking over the counter medication without physician approval due to her heart issues. Advised patient to call physician today to get an earlier appointment as her 6 month follow up is in the middle of December. Patient verbalized understanding.  Patient reports she is to see the pulmonologist on Thursday this week and the cardiologist on Dec 17th.  Patient lives alone but reports she lives in a duplex and her brother lives next door. Patient is independent with all aspects of care and drives herself to appointments.  Patient has been weighing daily and her weight is 148 lbs. Discussed with patient heart failure zones and notifying physician for problems.  She verbalized understanding.    Patient also has diabetes and reports that her sugars are under control with her latest sugar being 106. Advised patient to continue diabetic diet.  She verbalized understanding.   Objective:   Encounter Medications:  Outpatient Encounter Medications as of 05/21/2017  Medication Sig  . acetaminophen (TYLENOL) 500 MG tablet Take 1,000 mg by mouth every 6 (six) hours as needed (for pain.).   Marland Kitchen albuterol (PROVENTIL HFA;VENTOLIN HFA) 108 (90 Base) MCG/ACT inhaler Inhale 2 puffs into the lungs every 6 (six) hours as needed for wheezing or shortness of breath.  . allopurinol (ZYLOPRIM) 100 MG tablet Take 100 mg by mouth 2 (two) times daily.   . calcium carbonate (OS-CAL) 600 MG TABS Take 600 mg by mouth 2 (two) times daily with a meal.    . cholecalciferol (VITAMIN D) 1000 UNITS tablet Take 1,000 Units by mouth daily.  . cyclobenzaprine (FLEXERIL) 5 MG tablet  Take 1-2 tabs by mouth up to three times daily for muscle spasms.  . ferrous sulfate 325 (65 FE) MG tablet Take 325 mg by mouth daily with breakfast.  . furosemide (LASIX) 20 MG tablet TAKE ONE TABLET BY MOUTH DAILY AS NEEDED. (Patient taking differently: TAKE ONE TABLET BY MOUTH DAILY AS NEEDED FOR FLUID RETENTION (~EVERY OTHER DAY))  . gabapentin (NEURONTIN) 100 MG capsule Take 1 capsule 3 (three) times daily by mouth.  Marland Kitchen GLIPIZIDE XL 5 MG 24 hr tablet Take 5 mg by mouth daily before breakfast.  . hydrALAZINE (APRESOLINE) 25 MG tablet Take 0.5 tablets (12.5 mg total) by mouth every 8 (eight) hours.  . isosorbide mononitrate (IMDUR) 30 MG 24 hr tablet Take 0.5 tablets (15 mg total) by mouth daily.  Marland Kitchen levothyroxine (SYNTHROID, LEVOTHROID) 125 MCG tablet Take 125 mcg by mouth daily before breakfast.  . loperamide (IMODIUM) 2 MG capsule Take 2-4 mg by mouth 4 (four) times daily as needed for diarrhea or loose stools.  . Magnesium 250 MG TABS Take 250 mg by mouth daily.   . metoprolol succinate (TOPROL-XL) 100 MG 24 hr tablet Take 1 tablet (100 mg total) by mouth daily.  . Omega-3 Fatty Acids (FISH OIL) 1200 MG CAPS Take 1,200 mg by mouth 2 (two) times daily.  . pantoprazole (PROTONIX) 40 MG tablet Take 40 mg by mouth 2 (two) times daily.   . polyethylene glycol (MIRALAX / GLYCOLAX) packet Take 17 g by mouth daily as needed (for constipation.).   Marland Kitchen potassium chloride (K-DUR) 10 MEQ tablet  Take 10 mEq by mouth daily as needed. WITH LASIX (FLUID RETENTION) ~EVERY OTHER DAY  . pravastatin (PRAVACHOL) 10 MG tablet Take 10 mg by mouth daily.  . Probiotic Product (ALIGN PO) Take 1 tablet by mouth daily.   . Rivaroxaban (XARELTO) 15 MG TABS tablet Take 1 tablet (15 mg total) by mouth daily.  . sitaGLIPtin (JANUVIA) 50 MG tablet Take 50 mg by mouth daily.  . temazepam (RESTORIL) 15 MG capsule Take 1 capsule at bedtime by mouth.   No facility-administered encounter medications on file as of 05/21/2017.      Functional Status:  In your present state of health, do you have any difficulty performing the following activities: 05/21/2017 05/09/2017  Hearing? N -  Vision? N -  Difficulty concentrating or making decisions? N -  Walking or climbing stairs? Y -  Dressing or bathing? N -  Doing errands, shopping? N N  Preparing Food and eating ? N -  Using the Toilet? N -  In the past six months, have you accidently leaked urine? N -  Do you have problems with loss of bowel control? N -  Managing your Medications? Y -  Managing your Finances? Y -  Housekeeping or managing your Housekeeping? Y -  Some recent data might be hidden    Fall/Depression Screening: Fall Risk  05/21/2017  Falls in the past year? Yes  Comment back in june  Number falls in past yr: 1  Injury with Fall? Yes  Follow up Falls prevention discussed   PHQ 2/9 Scores 05/21/2017 12/05/2016  PHQ - 2 Score 0 0    Assessment: Patient will benefit from CM involvement for heart failure education and support.  Plan:  [facility] CM Care Plan Problem One     Most Recent Value  Care Plan Problem One  Recent Hospitalization  Role Documenting the Problem One  Care Management Telephonic [place] for Problem One  Active  [facility] Long Term Goal   Patient will not be readmitted to the hospital within the next 31 days.  [facility] Long Term Goal Start Date  05/21/17  Interventions for Problem One Long Term Goal  RN CM discussed with patient who to contact for emergency and importance of follow up appointments.  [facility] CM Short Term Goal #1   Patient will have follow up with pulmonologist as scheduled within 14 days.  [facility] CM Short Term Goal #1 Start Date  05/21/17  Interventions for Short Term Goal #1  RN CM discussed importance of follow up appointments.  [facility] CM Short Term Goal #2   Patient will know and understand heart failure zones within 30 days.   [facility] CM Short Term Goal #2 Start Date  05/21/17  Interventions for Short Term Goal #2   RN CM discussed with patient heart failure zones with signs and symptoms.       RN CM will provide ongoing education for patient on heart failure through phone calls and sending printed information to patient for further discussion.  RN CM will send initial barriers letter, assessment, and care plan to primary care physician.  RN CM will contact patient next week and patient agrees to next contact.   [name], RN, MSN [facility] Care Management Care Management Coordinator Direct Line [phone] Toll Free: [phone]  Fax: [phone]

## 2017-05-23 ENCOUNTER — Other Ambulatory Visit: Payer: Self-pay | Admitting: Pulmonary Disease

## 2017-05-23 DIAGNOSIS — R0600 Dyspnea, unspecified: Secondary | ICD-10-CM

## 2017-05-24 ENCOUNTER — Encounter: Payer: Self-pay | Admitting: Adult Health

## 2017-05-24 ENCOUNTER — Ambulatory Visit (INDEPENDENT_AMBULATORY_CARE_PROVIDER_SITE_OTHER): Payer: PPO | Admitting: Adult Health

## 2017-05-24 ENCOUNTER — Ambulatory Visit (INDEPENDENT_AMBULATORY_CARE_PROVIDER_SITE_OTHER): Payer: PPO | Admitting: Pulmonary Disease

## 2017-05-24 DIAGNOSIS — J849 Interstitial pulmonary disease, unspecified: Secondary | ICD-10-CM | POA: Diagnosis not present

## 2017-05-24 DIAGNOSIS — M1000 Idiopathic gout, unspecified site: Secondary | ICD-10-CM | POA: Diagnosis not present

## 2017-05-24 DIAGNOSIS — E559 Vitamin D deficiency, unspecified: Secondary | ICD-10-CM | POA: Diagnosis not present

## 2017-05-24 DIAGNOSIS — E1122 Type 2 diabetes mellitus with diabetic chronic kidney disease: Secondary | ICD-10-CM | POA: Diagnosis not present

## 2017-05-24 DIAGNOSIS — J209 Acute bronchitis, unspecified: Secondary | ICD-10-CM | POA: Diagnosis not present

## 2017-05-24 DIAGNOSIS — I4891 Unspecified atrial fibrillation: Secondary | ICD-10-CM | POA: Diagnosis not present

## 2017-05-24 DIAGNOSIS — E782 Mixed hyperlipidemia: Secondary | ICD-10-CM | POA: Diagnosis not present

## 2017-05-24 DIAGNOSIS — D509 Iron deficiency anemia, unspecified: Secondary | ICD-10-CM | POA: Diagnosis not present

## 2017-05-24 DIAGNOSIS — R0600 Dyspnea, unspecified: Secondary | ICD-10-CM

## 2017-05-24 DIAGNOSIS — I5021 Acute systolic (congestive) heart failure: Secondary | ICD-10-CM | POA: Diagnosis not present

## 2017-05-24 DIAGNOSIS — K219 Gastro-esophageal reflux disease without esophagitis: Secondary | ICD-10-CM | POA: Diagnosis not present

## 2017-05-24 DIAGNOSIS — E039 Hypothyroidism, unspecified: Secondary | ICD-10-CM | POA: Diagnosis not present

## 2017-05-24 DIAGNOSIS — Z6831 Body mass index (BMI) 31.0-31.9, adult: Secondary | ICD-10-CM | POA: Diagnosis not present

## 2017-05-24 DIAGNOSIS — I481 Persistent atrial fibrillation: Secondary | ICD-10-CM | POA: Diagnosis not present

## 2017-05-24 DIAGNOSIS — I5042 Chronic combined systolic (congestive) and diastolic (congestive) heart failure: Secondary | ICD-10-CM | POA: Diagnosis not present

## 2017-05-24 DIAGNOSIS — N183 Chronic kidney disease, stage 3 (moderate): Secondary | ICD-10-CM | POA: Diagnosis not present

## 2017-05-24 LAB — PULMONARY FUNCTION TEST
DL/VA % pred: 86 %
DL/VA: 3.36 ml/min/mmHg/L
DLCO UNC % PRED: 56 %
DLCO UNC: 9.14 ml/min/mmHg
DLCO cor % pred: 56 %
DLCO cor: 9.17 ml/min/mmHg
FEF 25-75 PRE: 1.07 L/s
FEF 25-75 Post: 2.07 L/sec
FEF2575-%Change-Post: 94 %
FEF2575-%Pred-Post: 105 %
FEF2575-%Pred-Pre: 54 %
FEV1-%Change-Post: 18 %
FEV1-%PRED-POST: 67 %
FEV1-%Pred-Pre: 56 %
FEV1-POST: 1.33 L
FEV1-Pre: 1.13 L
FEV1FVC-%Change-Post: 5 %
FEV1FVC-%Pred-Pre: 103 %
FEV6-%CHANGE-POST: 12 %
FEV6-%PRED-POST: 63 %
FEV6-%Pred-Pre: 55 %
FEV6-POST: 1.56 L
FEV6-Pre: 1.39 L
FEV6FVC-%CHANGE-POST: 0 %
FEV6FVC-%PRED-POST: 104 %
FEV6FVC-%Pred-Pre: 103 %
FVC-%CHANGE-POST: 12 %
FVC-%Pred-Post: 60 %
FVC-%Pred-Pre: 54 %
FVC-Post: 1.56 L
FVC-Pre: 1.4 L
POST FEV1/FVC RATIO: 85 %
PRE FEV1/FVC RATIO: 81 %
Post FEV6/FVC ratio: 100 %
Pre FEV6/FVC Ratio: 99 %
RV % pred: 82 %
RV: 1.41 L
TLC % PRED: 80 %
TLC: 3.32 L

## 2017-05-24 MED ORDER — AZITHROMYCIN 250 MG PO TABS: ORAL_TABLET | ORAL | 0 refills | Status: AC

## 2017-05-24 NOTE — Assessment & Plan Note (Signed)
Recent flare with hospitalization , now improving  Cont follow up w/ cards.

## 2017-05-24 NOTE — Progress Notes (Signed)
@Patient  ID: [name], female    DOB: July 18, 1955, 61 y.o.   MRN: [id number]  Chief Complaint  Patient presents with  . Follow-up    ILD     Referring provider: Celene Squibb, MD  HPI: 61 year old female never smoker seen 11/21/2016 for cough and shortness of breath Chronic A Fib 2015 s/p DCCV placed on amiodarone  Mild OSA w/ AHI 7/hr , CPAP intolerant  Echo June 2018, EF 17%, gr 2 diastolic dysfunction, severely dilated left atrium moderately dilated. Right atrium, pulmonary pressure 52 mmHg.   05/24/2017 Follow up ; ILD /Amiodarone Toxicity  Pt returns for 3 months follow up . She was recently admitted last month for acute on chronic CHF . Echo showed EF at 25%. Cath showed nonobstructive CAD . RHC showed mild elevated PAP . Marland Kitchen She improved with diuresis and HR control . meds were adjusted and started on xarelto.   She is feeling better some better with breathing but still sob with activity . No increased edema.  She complains of cough , congestion , and drainage for last 1-2 weeks started when she was admitted. Yellow sinus drainage . Appetite is good.   She has interstitial changes on CT chest and restrictive lung dz on PFT. Possibly due to Amiodarone lung toxicity . She was taken off Amiodarone in 10/2016 .  PFT done today shows stable lung function . FEV1 67% , ratio 85, FVC 60%. + BD response, DLCO 56%. (PFT 11/2016 FVC 64%, DLCO 61%)  Hard to detemine if breathing is doing any better has been more sob for last several weeks .  Has CT  Chest planned next month.    Allergies  Allergen Reactions  . Flecainide Nausea Only and Other (See Comments)    Faint feeling  . Hydrocodone-Acetaminophen Nausea Only and Other (See Comments)    Severe headache  . Ibuprofen Other (See Comments)    Kidney dysfunction  . Oxycodone Hcl Nausea Only and Other (See Comments)    Headache  . Penicillins Nausea Only and Other (See Comments)    Severe headache Has patient had a PCN  reaction causing immediate rash, facial/tongue/throat swelling, SOB or lightheadedness with hypotension: No Has patient had a PCN reaction causing severe rash involving mucus membranes or skin necrosis: No Has patient had a PCN reaction that required hospitalization: No Has patient had a PCN reaction occurring within the last 10 years: No If all of the above answers are "NO", then may proceed with Cephalosporin use.     Immunization History  Administered Date(s) Administered  . Influenza Split 03/25/2016, 03/25/2017  . Influenza-Unspecified 03/07/2014  . Pneumococcal Conjugate-13 04/26/2015  . Zoster 05/25/2016    Past Medical History:  Diagnosis Date  . Amiodarone toxicity   . Anemia   . Chronic bronchitis   . Chronic systolic CHF (congestive heart failure) ([place])    a. 04/2017 Echo: EF 25%.  . Congenital heart disease   . Diabetes mellitus without complication ([place])    Type II  . Diverticulosis   . Fall due to stumbling October 08, 2013   Due to shoes  . GERD (gastroesophageal reflux disease)   . Gout   . Hemorrhoids   . Hyperlipidemia   . Hyperparathyroidism   . Hypertension   . Hypothyroidism   . IDA (iron deficiency anemia)   . Microscopic colitis 9/11   Colonoscopy  . NICM (nonischemic cardiomyopathy) ([place])    a. 04/2017 Echo: EF 25%, basal-mid inferolateral, inf  AK, triv AI, mild MR, sev dil LA, mod red RV fxn, mildly dil RA, mild to mod TR, PASP 53mmHg.  . Non-obstructive CAD (coronary artery disease)    a. 04/2017 Cath: LM 20p, LCX nl, RCA 20p/m.  Marland Kitchen Paroxysmal SVT (supraventricular tachycardia) ([place])   . Persistent atrial fibrillation ([place]) 06/2013   a. 04/2017 Admit with CHF/NICM/AFib -> CHA2DS2VASc = 4-->Xarelto 15 mg daily started.  Marland Kitchen PVD (peripheral vascular disease) ([place]) 09/2004   Right common femoral endarterectomy in April of 2007  . Renal insufficiency   . Schatzki's ring    Last EGD with esophageal dilatation 3F  9/11  . Scoliosis   . Varicose veins  right leg pain and swelling    Tobacco History: Social History   Tobacco Use  Smoking Status Never Smoker  Smokeless Tobacco Never Used  Tobacco Comment   Never smoked   Counseling given: Not Answered Comment: Never smoked   Outpatient Encounter Medications as of 05/24/2017  Medication Sig  . acetaminophen (TYLENOL) 500 MG tablet Take 1,000 mg by mouth every 6 (six) hours as needed (for pain.).   Marland Kitchen albuterol (PROVENTIL HFA;VENTOLIN HFA) 108 (90 Base) MCG/ACT inhaler Inhale 2 puffs into the lungs every 6 (six) hours as needed for wheezing or shortness of breath.  . allopurinol (ZYLOPRIM) 100 MG tablet Take 100 mg by mouth 2 (two) times daily.   . calcium carbonate (OS-CAL) 600 MG TABS Take 600 mg by mouth 2 (two) times daily with a meal.    . cholecalciferol (VITAMIN D) 1000 UNITS tablet Take 1,000 Units by mouth daily.  . cyclobenzaprine (FLEXERIL) 5 MG tablet Take 1-2 tabs by mouth up to three times daily for muscle spasms.  . ferrous sulfate 325 (65 FE) MG tablet Take 325 mg by mouth daily with breakfast.  . furosemide (LASIX) 20 MG tablet TAKE ONE TABLET BY MOUTH DAILY AS NEEDED. (Patient taking differently: TAKE ONE TABLET BY MOUTH DAILY AS NEEDED FOR FLUID RETENTION (~EVERY OTHER DAY))  . gabapentin (NEURONTIN) 100 MG capsule Take 1 capsule 3 (three) times daily by mouth.  Marland Kitchen GLIPIZIDE XL 5 MG 24 hr tablet Take 5 mg by mouth daily before breakfast.  . hydrALAZINE (APRESOLINE) 25 MG tablet Take 0.5 tablets (12.5 mg total) by mouth every 8 (eight) hours.  . isosorbide mononitrate (IMDUR) 30 MG 24 hr tablet Take 0.5 tablets (15 mg total) by mouth daily.  Marland Kitchen levothyroxine (SYNTHROID, LEVOTHROID) 125 MCG tablet Take 125 mcg by mouth daily before breakfast.  . loperamide (IMODIUM) 2 MG capsule Take 2-4 mg by mouth 4 (four) times daily as needed for diarrhea or loose stools.  . Magnesium 250 MG TABS Take 250 mg by mouth daily.   . metoprolol succinate (TOPROL-XL) 100 MG 24 hr tablet Take  1 tablet (100 mg total) by mouth daily.  . Omega-3 Fatty Acids (FISH OIL) 1200 MG CAPS Take 1,200 mg by mouth 2 (two) times daily.  . pantoprazole (PROTONIX) 40 MG tablet Take 40 mg by mouth 2 (two) times daily.   . polyethylene glycol (MIRALAX / GLYCOLAX) packet Take 17 g by mouth daily as needed (for constipation.).   Marland Kitchen potassium chloride (K-DUR) 10 MEQ tablet Take 10 mEq by mouth daily as needed. WITH LASIX (FLUID RETENTION) ~EVERY OTHER DAY  . pravastatin (PRAVACHOL) 10 MG tablet Take 10 mg by mouth daily.  . Probiotic Product (ALIGN PO) Take 1 tablet by mouth daily.   . Rivaroxaban (XARELTO) 15 MG TABS tablet Take 1 tablet (  15 mg total) by mouth daily.  . sitaGLIPtin (JANUVIA) 50 MG tablet Take 50 mg by mouth daily.  . temazepam (RESTORIL) 15 MG capsule Take 1 capsule at bedtime by mouth.  Marland Kitchen azithromycin (ZITHROMAX Z-PAK) 250 MG tablet Take 2 tablets (500 mg) on  Day 1,  followed by 1 tablet (250 mg) once daily on Days 2 through 5.   No facility-administered encounter medications on file as of 05/24/2017.      Review of Systems  Constitutional:   No  weight loss, night sweats,  Fevers, chills, fatigue, or  lassitude.  HEENT:   No headaches,  Difficulty swallowing,  Tooth/dental problems, or  Sore throat,                No sneezing, itching, ear ache, nasal congestion, post nasal drip,   CV:  No chest pain,  Orthopnea, PND, swelling in lower extremities, anasarca, dizziness, palpitations, syncope.   GI  No heartburn, indigestion, abdominal pain, nausea, vomiting, diarrhea, change in bowel habits, loss of appetite, bloody stools.   Resp:   No chest wall deformity  Skin: no rash or lesions.  GU: no dysuria, change in color of urine, no urgency or frequency.  No flank pain, no hematuria   MS:  No joint pain or swelling.  No decreased range of motion.  No back pain.    Physical Exam  BP 112/80 (BP Location: Left Arm, Cuff Size: Normal)   Pulse 88   Ht 4\' 10"  (1.473 m)   Wt  148 lb (67.1 kg)   SpO2 98%   BMI 30.93 kg/m   GEN: A/Ox3; pleasant , NAD, elderly    HEENT:  [place]/AT,  EACs-clear, TMs-wnl, NOSE-clear, THROAT-clear, no lesions, no postnasal drip or exudate noted.   NECK:  Supple w/ fair ROM; no JVD; normal carotid impulses w/o bruits; no thyromegaly or nodules palpated; no lymphadenopathy.    RESP  Few trace rhonchi , no accessory muscle use, no dullness to percussion  CARD:  RRR, no m/r/g, no peripheral edema, pulses intact, no cyanosis or clubbing.  GI:   Soft & nt; nml bowel sounds; no organomegaly or masses detected.   Musco: Warm bil, no deformities or joint swelling noted.   Neuro: alert, no focal deficits noted.    Skin: Warm, no lesions or rashes   Imaging: Dg Chest 2 View  Result Date: 05/16/2017 CLINICAL DATA:  Congestive heart failure. Prior to cough and shortness of breath for 1 week. EXAM: CHEST  2 VIEW COMPARISON:  05/09/2017 FINDINGS: Mildly degraded lateral view secondary to artifact posteriorly. Surgical clips in the low neck. Probable enchondroma or infarct in the proximal right humerus. Midline trachea. Moderate cardiomegaly. Mediastinal contours otherwise within normal limits. No pleural effusion or pneumothorax. Mild lower lobe predominant interstitial thickening is not significantly changed. No overt congestive failure or lobar consolidation. IMPRESSION: Cardiomegaly and mild chronic interstitial thickening. No acute findings. Electronically Signed   By: Abigail Miyamoto M.D.   On: 05/16/2017 19:16   Dg Chest 2 View  Result Date: 05/09/2017 CLINICAL DATA:  Cough and chest congestion with shortness of breath over the past 2 days. History of congenital heart disease, chronic bronchitis, diabetes, never smoked. EXAM: CHEST  2 VIEW COMPARISON:  Chest x-ray of February 23, 2017 and chest CT scan of March 08, 2017. FINDINGS: The lungs are slightly less well inflated today especially on the right. The interstitial markings are coarse  and more conspicuous today. The cardiac silhouette is enlarged.  The pulmonary vascularity is engorged similar to that seen previously. There is calcification in the wall of the aortic arch and tortuosity of the descending thoracic aorta. There surgical clips in the upper paratracheal region. There is a small right pleural effusion. IMPRESSION: Chronic interstitial changes bilaterally and pulmonary vascular congestion accentuated by mild hypoinflation. Stable enlargement of cardiac silhouette. Small right pleural effusion, stable. Thoracic aortic atherosclerosis. Electronically Signed   By: David  Martinique M.D.   On: 05/09/2017 10:36   Nm Pulmonary Perf And Vent  Result Date: 05/16/2017 CLINICAL DATA:  Dyspnea.  CHF and elevated D-dimer. EXAM: NUCLEAR MEDICINE VENTILATION - PERFUSION LUNG SCAN TECHNIQUE: Ventilation images were obtained in multiple projections using inhaled aerosol Tc-55m DTPA. Perfusion images were obtained in multiple projections after intravenous injection of Tc-55m MAA. RADIOPHARMACEUTICALS:  Thirty mCi Technetium-42m DTPA aerosol inhalation and 4.2 mCi Technetium-68m MAA IV COMPARISON:  Chest x-ray from the same day FINDINGS: There are defects on AP view from cardiomegaly and lateral right chest wall deformity/pleural thickening. On the LPO, there is a moderate pleural-based pefusion defect at the lower lobe that is not clearly seen on the other views, suspect this is related to a Bochdalek's hernia seen on 01/04/2017 abdominal CT and comparison chest x-ray. As well, this is likely matched, as permitted by heterogeneous airway distribution in this patient with history of chronic bronchitis. Other apparent defects are attributable to mediastinal/hilar structures. IMPRESSION: Low probability for pulmonary embolism by PIOPED 2. Electronically Signed   By: Monte Fantasia M.D.   On: 05/16/2017 18:56   US Venous Img Lower Bilateral  Result Date: 05/10/2017 CLINICAL DATA:  Bilateral lower  extremity edema with occasional shortness of breath. Elevated D-dimer. Evaluate for DVT. EXAM: BILATERAL LOWER EXTREMITY VENOUS DOPPLER ULTRASOUND TECHNIQUE: Gray-scale sonography with graded compression, as well as color Doppler and duplex ultrasound were performed to evaluate the lower extremity deep venous systems from the level of the common femoral vein and including the common femoral, femoral, profunda femoral, popliteal and calf veins including the posterior tibial, peroneal and gastrocnemius veins when visible. The superficial great saphenous vein was also interrogated. Spectral Doppler was utilized to evaluate flow at rest and with distal augmentation maneuvers in the common femoral, femoral and popliteal veins. COMPARISON:  None. FINDINGS: RIGHT LOWER EXTREMITY Common Femoral Vein: No evidence of thrombus. Normal compressibility, respiratory phasicity and response to augmentation. Saphenofemoral Junction: No evidence of thrombus. Normal compressibility and flow on color Doppler imaging. Profunda Femoral Vein: No evidence of thrombus. Normal compressibility and flow on color Doppler imaging. Femoral Vein: No evidence of thrombus. Normal compressibility, respiratory phasicity and response to augmentation. Popliteal Vein: No evidence of thrombus. Normal compressibility, respiratory phasicity and response to augmentation. Calf Veins: No evidence of thrombus. Normal compressibility and flow on color Doppler imaging. Superficial Great Saphenous Vein: No evidence of thrombus. Normal compressibility. Venous Reflux:  None. Other Findings:  None. LEFT LOWER EXTREMITY Common Femoral Vein: No evidence of thrombus. Normal compressibility, respiratory phasicity and response to augmentation. Saphenofemoral Junction: No evidence of thrombus. Normal compressibility and flow on color Doppler imaging. Profunda Femoral Vein: No evidence of thrombus. Normal compressibility and flow on color Doppler imaging. Femoral Vein: No  evidence of thrombus. Normal compressibility, respiratory phasicity and response to augmentation. Popliteal Vein: No evidence of thrombus. Normal compressibility, respiratory phasicity and response to augmentation. Calf Veins: No evidence of thrombus. Normal compressibility and flow on color Doppler imaging. Superficial Great Saphenous Vein: No evidence of thrombus. Normal compressibility. Venous Reflux:  None.  Other Findings:  None. IMPRESSION: No evidence of DVT within either lower extremity. Electronically Signed   By: Sandi Mariscal M.D.   On: 05/10/2017 09:57     Assessment & Plan:   Acute systolic heart failure (HCC) Recent flare with hospitalization , now improving  Cont follow up w/ cards.   ILD (interstitial lung disease) ([place]) Appears to be stable ?Amiodarone toxicity .  Has been off since 10/2016 .  She is sick today with bronchitis , there is some reversibility on PFT today suspect due to acute illness. DLCO is minimally decrease from June 2018 . She has upcoming CT chest next month planned.  Plan  Patient Instructions  Zpack take as directed  Mucinex DM. Twice daily  As needed cough/congestion  Saline nasal rinses As needed   Follow up for CT chest next month as planned  Follow up Dr. Elsworth Soho next month as planned  Please contact office for sooner follow up if symptoms do not improve or worsen or seek emergency care       Acute bronchitis URI /Early Bronchitis   Plan  Patient Instructions  Zpack take as directed  Mucinex DM. Twice daily  As needed cough/congestion  Saline nasal rinses As needed   Follow up for CT chest next month as planned  Follow up Dr. Elsworth Soho next month as planned  Please contact office for sooner follow up if symptoms do not improve or worsen or seek emergency care          Rexene Edison, NP 05/24/2017

## 2017-05-24 NOTE — Progress Notes (Signed)
PFT done today. 

## 2017-05-24 NOTE — Assessment & Plan Note (Signed)
Appears to be stable ?Amiodarone toxicity .  Has been off since 10/2016 .  She is sick today with bronchitis , there is some reversibility on PFT today suspect due to acute illness. DLCO is minimally decrease from June 2018 . She has upcoming CT chest next month planned.  Plan  Patient Instructions  Zpack take as directed  Mucinex DM. Twice daily  As needed cough/congestion  Saline nasal rinses As needed   Follow up for CT chest next month as planned  Follow up Dr. Elsworth Soho next month as planned  Please contact office for sooner follow up if symptoms do not improve or worsen or seek emergency care

## 2017-05-24 NOTE — Assessment & Plan Note (Signed)
URI /Early Bronchitis   Plan  Patient Instructions  Zpack take as directed  Mucinex DM. Twice daily  As needed cough/congestion  Saline nasal rinses As needed   Follow up for CT chest next month as planned  Follow up Dr. Elsworth Soho next month as planned  Please contact office for sooner follow up if symptoms do not improve or worsen or seek emergency care

## 2017-05-24 NOTE — Patient Instructions (Addendum)
Zpack take as directed  Mucinex DM. Twice daily  As needed cough/congestion  Saline nasal rinses As needed   Follow up for CT chest next month as planned  Follow up Dr. Elsworth Soho next month as planned  Please contact office for sooner follow up if symptoms do not improve or worsen or seek emergency care

## 2017-05-25 NOTE — Progress Notes (Signed)
Reviewed & agree with plan  

## 2017-05-28 ENCOUNTER — Other Ambulatory Visit: Payer: Self-pay

## 2017-05-28 NOTE — Patient Outreach (Signed)
[place]) Care Management  05/28/2017  [name] December 01, 1955 [id number]   Telephone call to patient for week 2 transition of care call.  No answer.  HIPAA compliant voice message left.  Plan: RN CM will attempt patient again within 7 business days.   [name], RN, MSN [facility] Care Management Care Management Coordinator Direct Line [phone] Toll Free: [phone]  Fax: [phone]

## 2017-05-28 NOTE — Patient Outreach (Signed)
[facility]) Care Management  [place]  05/28/2017   [name] March 14, 1956 [id number]  Subjective: Incoming call to patient for weekly transition of care call. Patient reports she is doing good. She did see pulmonologist last week and z-pack given for congestion.  Patient reports she feels like her cough and congestion is some better.  Patient reports that her weight is 147 lbs. Patient continues to take medication and monitor for active symptoms of heart failure.  Discussed with patient heart failure zones. She verbalized understanding.     Objective:   Encounter Medications:  Outpatient Encounter Medications as of 05/28/2017  Medication Sig  . acetaminophen (TYLENOL) 500 MG tablet Take 1,000 mg by mouth every 6 (six) hours as needed (for pain.).   Marland Kitchen albuterol (PROVENTIL HFA;VENTOLIN HFA) 108 (90 Base) MCG/ACT inhaler Inhale 2 puffs into the lungs every 6 (six) hours as needed for wheezing or shortness of breath.  . allopurinol (ZYLOPRIM) 100 MG tablet Take 100 mg by mouth 2 (two) times daily.   Marland Kitchen azithromycin (ZITHROMAX Z-PAK) 250 MG tablet Take 2 tablets (500 mg) on  Day 1,  followed by 1 tablet (250 mg) once daily on Days 2 through 5.  . calcium carbonate (OS-CAL) 600 MG TABS Take 600 mg by mouth 2 (two) times daily with a meal.    . cholecalciferol (VITAMIN D) 1000 UNITS tablet Take 1,000 Units by mouth daily.  . cyclobenzaprine (FLEXERIL) 5 MG tablet Take 1-2 tabs by mouth up to three times daily for muscle spasms.  . ferrous sulfate 325 (65 FE) MG tablet Take 325 mg by mouth daily with breakfast.  . furosemide (LASIX) 20 MG tablet TAKE ONE TABLET BY MOUTH DAILY AS NEEDED. (Patient taking differently: TAKE ONE TABLET BY MOUTH DAILY AS NEEDED FOR FLUID RETENTION (~EVERY OTHER DAY))  . gabapentin (NEURONTIN) 100 MG capsule Take 1 capsule 3 (three) times daily by mouth.  Marland Kitchen GLIPIZIDE XL 5 MG 24 hr tablet Take 5 mg by mouth daily before breakfast.  .  hydrALAZINE (APRESOLINE) 25 MG tablet Take 0.5 tablets (12.5 mg total) by mouth every 8 (eight) hours.  . isosorbide mononitrate (IMDUR) 30 MG 24 hr tablet Take 0.5 tablets (15 mg total) by mouth daily.  Marland Kitchen levothyroxine (SYNTHROID, LEVOTHROID) 125 MCG tablet Take 125 mcg by mouth daily before breakfast.  . loperamide (IMODIUM) 2 MG capsule Take 2-4 mg by mouth 4 (four) times daily as needed for diarrhea or loose stools.  . Magnesium 250 MG TABS Take 250 mg by mouth daily.   . metoprolol succinate (TOPROL-XL) 100 MG 24 hr tablet Take 1 tablet (100 mg total) by mouth daily.  . Omega-3 Fatty Acids (FISH OIL) 1200 MG CAPS Take 1,200 mg by mouth 2 (two) times daily.  . pantoprazole (PROTONIX) 40 MG tablet Take 40 mg by mouth 2 (two) times daily.   . polyethylene glycol (MIRALAX / GLYCOLAX) packet Take 17 g by mouth daily as needed (for constipation.).   Marland Kitchen potassium chloride (K-DUR) 10 MEQ tablet Take 10 mEq by mouth daily as needed. WITH LASIX (FLUID RETENTION) ~EVERY OTHER DAY  . pravastatin (PRAVACHOL) 10 MG tablet Take 10 mg by mouth daily.  . Probiotic Product (ALIGN PO) Take 1 tablet by mouth daily.   . Rivaroxaban (XARELTO) 15 MG TABS tablet Take 1 tablet (15 mg total) by mouth daily.  . sitaGLIPtin (JANUVIA) 50 MG tablet Take 50 mg by mouth daily.  . temazepam (RESTORIL) 15 MG capsule Take  1 capsule at bedtime by mouth.   No facility-administered encounter medications on file as of 05/28/2017.     Functional Status:  In your present state of health, do you have any difficulty performing the following activities: 05/21/2017 05/09/2017  Hearing? N -  Vision? N -  Difficulty concentrating or making decisions? N -  Walking or climbing stairs? Y -  Dressing or bathing? N -  Doing errands, shopping? N N  Preparing Food and eating ? N -  Using the Toilet? N -  In the past six months, have you accidently leaked urine? N -  Do you have problems with loss of bowel control? N -  Managing your  Medications? Y -  Managing your Finances? Y -  Housekeeping or managing your Housekeeping? Y -  Some recent data might be hidden    Fall/Depression Screening: Fall Risk  05/21/2017  Falls in the past year? Yes  Comment back in june  Number falls in past yr: 1  Injury with Fall? Yes  Follow up Falls prevention discussed   PHQ 2/9 Scores 05/21/2017 12/05/2016  PHQ - 2 Score 0 0    Assessment: Patient continues to benefit from care manager outreach for disease management and support.    Plan:  [facility] CM Care Plan Problem One     Most Recent Value  Care Plan Problem One  Recent Hospitalization  Role Documenting the Problem One  Care Management Telephonic [place] for Problem One  Active  THN Long Term Goal   Patient will not be readmitted to the hospital within the next 31 days.  THN Long Term Goal Start Date  05/21/17  Interventions for Problem One Long Term Goal  RN CM reiterated with patient emergency contacts for health problems.  THN CM Short Term Goal #1   Patient will have follow up with pulmonologist as scheduled within 14 days.  THN CM Short Term Goal #1 Start Date  05/21/17  [facility] CM Short Term Goal #1 Met Date  05/28/17  Interventions for Short Term Goal #1  Patient saw pulmonologist.    [facility] CM Short Term Goal #2   Patient will know and understand heart failure zones within 30 days.   THN CM Short Term Goal #2 Start Date  05/21/17  Interventions for Short Term Goal #2  RN CM Reviewed with patient heart failure red zone and action to take.      RN CM will contact patient next week for weekly call and patient agrees to next outreach.    [name], RN, MSN [facility] Care Management Care Management Coordinator Direct Line [phone] Toll Free: [phone]  Fax: [phone]

## 2017-05-30 ENCOUNTER — Ambulatory Visit: Payer: Self-pay

## 2017-06-04 ENCOUNTER — Other Ambulatory Visit: Payer: Self-pay

## 2017-06-04 NOTE — Patient Outreach (Signed)
[facility]) Care Management  [place]  06/04/2017   [name] [birth year] [id number]  Subjective: Telephone call to patient for weekly TOC call and ending TOC. Patient reports she is doing well and that her weight was 143 lbs today.  Discussed with patient weight and heart failure.  Also discussed with patient heart failure zones and when to get help.  Discussed with patient transfer to health coach for further disease management of heart failure.  She verbalized understanding and agrees with transfer to health coach.  Objective:   Encounter Medications:  Outpatient Encounter Medications as of 06/04/2017  Medication Sig  . acetaminophen (TYLENOL) 500 MG tablet Take 1,000 mg by mouth every 6 (six) hours as needed (for pain.).   Marland Kitchen albuterol (PROVENTIL HFA;VENTOLIN HFA) 108 (90 Base) MCG/ACT inhaler Inhale 2 puffs into the lungs every 6 (six) hours as needed for wheezing or shortness of breath.  . allopurinol (ZYLOPRIM) 100 MG tablet Take 100 mg by mouth 2 (two) times daily.   . calcium carbonate (OS-CAL) 600 MG TABS Take 600 mg by mouth 2 (two) times daily with a meal.    . cholecalciferol (VITAMIN D) 1000 UNITS tablet Take 1,000 Units by mouth daily.  . cyclobenzaprine (FLEXERIL) 5 MG tablet Take 1-2 tabs by mouth up to three times daily for muscle spasms.  . ferrous sulfate 325 (65 FE) MG tablet Take 325 mg by mouth daily with breakfast.  . furosemide (LASIX) 20 MG tablet TAKE ONE TABLET BY MOUTH DAILY AS NEEDED. (Patient taking differently: TAKE ONE TABLET BY MOUTH DAILY AS NEEDED FOR FLUID RETENTION (~EVERY OTHER DAY))  . gabapentin (NEURONTIN) 100 MG capsule Take 1 capsule 3 (three) times daily by mouth.  Marland Kitchen GLIPIZIDE XL 5 MG 24 hr tablet Take 5 mg by mouth daily before breakfast.  . hydrALAZINE (APRESOLINE) 25 MG tablet Take 0.5 tablets (12.5 mg total) by mouth every 8 (eight) hours.  . isosorbide mononitrate (IMDUR) 30 MG 24 hr tablet Take 0.5 tablets  (15 mg total) by mouth daily.  Marland Kitchen levothyroxine (SYNTHROID, LEVOTHROID) 125 MCG tablet Take 125 mcg by mouth daily before breakfast.  . loperamide (IMODIUM) 2 MG capsule Take 2-4 mg by mouth 4 (four) times daily as needed for diarrhea or loose stools.  . Magnesium 250 MG TABS Take 250 mg by mouth daily.   . metoprolol succinate (TOPROL-XL) 100 MG 24 hr tablet Take 1 tablet (100 mg total) by mouth daily.  . Omega-3 Fatty Acids (FISH OIL) 1200 MG CAPS Take 1,200 mg by mouth 2 (two) times daily.  . pantoprazole (PROTONIX) 40 MG tablet Take 40 mg by mouth 2 (two) times daily.   . polyethylene glycol (MIRALAX / GLYCOLAX) packet Take 17 g by mouth daily as needed (for constipation.).   Marland Kitchen potassium chloride (K-DUR) 10 MEQ tablet Take 10 mEq by mouth daily as needed. WITH LASIX (FLUID RETENTION) ~EVERY OTHER DAY  . pravastatin (PRAVACHOL) 10 MG tablet Take 10 mg by mouth daily.  . Probiotic Product (ALIGN PO) Take 1 tablet by mouth daily.   . Rivaroxaban (XARELTO) 15 MG TABS tablet Take 1 tablet (15 mg total) by mouth daily.  . sitaGLIPtin (JANUVIA) 50 MG tablet Take 50 mg by mouth daily.  . temazepam (RESTORIL) 15 MG capsule Take 1 capsule at bedtime by mouth.   No facility-administered encounter medications on file as of 06/04/2017.     Functional Status:  In your present state of health, do you have any  difficulty performing the following activities: 05/21/2017 05/09/2017  Hearing? N -  Vision? N -  Difficulty concentrating or making decisions? N -  Walking or climbing stairs? Y -  Dressing or bathing? N -  Doing errands, shopping? N N  Preparing Food and eating ? N -  Using the Toilet? N -  In the past six months, have you accidently leaked urine? N -  Do you have problems with loss of bowel control? N -  Managing your Medications? Y -  Managing your Finances? Y -  Housekeeping or managing your Housekeeping? Y -  Some recent data might be hidden    Fall/Depression Screening: Fall Risk   05/21/2017  Falls in the past year? Yes  Comment back in june  Number falls in past yr: 1  Injury with Fall? Yes  Follow up Falls prevention discussed   PHQ 2/9 Scores 05/21/2017 12/05/2016  PHQ - 2 Score 0 0    Assessment: Patient continues to benefit from care management outreach for disease management and support.  Patient ready for transfer to health coach for disease management.  Plan:  [facility] CM Care Plan Problem One     Most Recent Value  Care Plan Problem One  Recent Hospitalization  Role Documenting the Problem One  Care Management Telephonic [place] for Problem One  Active  THN Long Term Goal   Patient will not be readmitted to the hospital within the next 31 days.  THN Long Term Goal Start Date  05/21/17  Interventions for Problem One Long Term Goal  RN CM reinforced with patient emergency contacts for health problems.  THN CM Short Term Goal #2   Patient will know and understand heart failure zones within 30 days.   THN CM Short Term Goal #2 Start Date  05/21/17  Interventions for Short Term Goal #2  RN CM reinforced with patient heart failure red zone and action to take.      RN CM will send discipline closure letter to physician and complete order for health coach.    [name], RN, MSN [facility] Care Management Care Management Coordinator Direct Line [phone] Toll Free: [phone]  Fax: [phone]

## 2017-06-04 NOTE — Patient Outreach (Signed)
[facility]) Care Management  06/04/2017  [name] 03/16/1956 [id number]   Telephone call to patient for weekly TOC call. No answer. HIPAA compliant voice message left.  Plan: RN CM will attempt patient within 5 business days.    [name], RN, MSN [facility] Care Management Care Management Coordinator Direct Line [phone] Toll Free: [phone]  Fax: [phone]

## 2017-06-07 ENCOUNTER — Ambulatory Visit: Payer: Self-pay

## 2017-06-08 ENCOUNTER — Encounter: Payer: Self-pay | Admitting: *Deleted

## 2017-06-08 ENCOUNTER — Other Ambulatory Visit: Payer: Self-pay | Admitting: *Deleted

## 2017-06-08 NOTE — Patient Outreach (Signed)
[facility]) Care Management  06/08/2017  [name] May 26, 1956 [id number]  RN Health Coach Initial Assessment  Referral Date:  06/05/2017 Referral Source:  Transfer from [facility] Reason for Referral:  Disease Management Education Insurance:  [employer]   Outreach Attempt:  Successful telephone outreach to patient for introductory call.  HIPAA confirmed.  RN Health Coach introduced self and role to patient.  Patient verbally consents to monthly outreaches and request call next week to complete initial telephonic assessment.  Plan:  RN Health Coach will make outreach to patient within the next 7 business days to complete initial telephone assessment.   [place] [phone] [email or web address]

## 2017-06-11 ENCOUNTER — Encounter: Payer: Self-pay | Admitting: *Deleted

## 2017-06-11 ENCOUNTER — Ambulatory Visit (INDEPENDENT_AMBULATORY_CARE_PROVIDER_SITE_OTHER): Payer: PPO | Admitting: Internal Medicine

## 2017-06-11 ENCOUNTER — Encounter: Payer: Self-pay | Admitting: Internal Medicine

## 2017-06-11 VITALS — BP 110/84 | HR 100 | Ht <= 58 in | Wt 143.0 lb

## 2017-06-11 DIAGNOSIS — Z01818 Encounter for other preprocedural examination: Secondary | ICD-10-CM | POA: Diagnosis not present

## 2017-06-11 DIAGNOSIS — D508 Other iron deficiency anemias: Secondary | ICD-10-CM

## 2017-06-11 NOTE — Progress Notes (Signed)
HPI [name] returns today for ongoing evaluation and management of her atrial fibrillation and rapid ventricular response.  She is a 61 year old woman with a long history of uncontrolled atrial fibrillation, refractory to all medications except for amiodarone, which maintained her in sinus rhythm but for which she developed evidence of amiodarone lung toxicity.  Her amiodarone was discontinued and in the interim she is redeveloped atrial fibrillation with a rapid ventricular response.  Unfortunately, she has developed a tachycardia induced cardiomyopathy.  Attempts to control her ventricular rate has been difficult. Allergies  Allergen Reactions  . Flecainide Nausea Only and Other (See Comments)    Faint feeling  . Hydrocodone-Acetaminophen Nausea Only and Other (See Comments)    Severe headache  . Ibuprofen Other (See Comments)    Kidney dysfunction  . Oxycodone Hcl Nausea Only and Other (See Comments)    Headache  . Penicillins Nausea Only and Other (See Comments)    Severe headache Has patient had a PCN reaction causing immediate rash, facial/tongue/throat swelling, SOB or lightheadedness with hypotension: No Has patient had a PCN reaction causing severe rash involving mucus membranes or skin necrosis: No Has patient had a PCN reaction that required hospitalization: No Has patient had a PCN reaction occurring within the last 10 years: No If all of the above answers are "NO", then may proceed with Cephalosporin use.      Current Outpatient Medications  Medication Sig Dispense Refill  . acetaminophen (TYLENOL) 500 MG tablet Take 1,000 mg by mouth every 6 (six) hours as needed (for pain.).     Marland Kitchen albuterol (PROVENTIL HFA;VENTOLIN HFA) 108 (90 Base) MCG/ACT inhaler Inhale 2 puffs into the lungs every 6 (six) hours as needed for wheezing or shortness of breath. 1 Inhaler 3  . allopurinol (ZYLOPRIM) 100 MG tablet Take 100 mg by mouth 2 (two) times daily.     . calcium carbonate  (OS-CAL) 600 MG TABS Take 600 mg by mouth 2 (two) times daily with a meal.      . cholecalciferol (VITAMIN D) 1000 UNITS tablet Take 1,000 Units by mouth daily.    . cyclobenzaprine (FLEXERIL) 5 MG tablet Take 1-2 tabs by mouth up to three times daily for muscle spasms. 30 tablet 0  . ferrous sulfate 325 (65 FE) MG tablet Take 325 mg by mouth daily with breakfast.    . furosemide (LASIX) 20 MG tablet TAKE ONE TABLET BY MOUTH DAILY AS NEEDED. (Patient taking differently: TAKE ONE TABLET BY MOUTH DAILY AS NEEDED FOR FLUID RETENTION (~EVERY OTHER DAY)) 30 tablet 3  . gabapentin (NEURONTIN) 100 MG capsule Take 1 capsule 3 (three) times daily by mouth.    Marland Kitchen GLIPIZIDE XL 5 MG 24 hr tablet Take 5 mg by mouth daily before breakfast.    . hydrALAZINE (APRESOLINE) 25 MG tablet Take 0.5 tablets (12.5 mg total) by mouth every 8 (eight) hours. 45 tablet 6  . isosorbide mononitrate (IMDUR) 30 MG 24 hr tablet Take 0.5 tablets (15 mg total) by mouth daily. 30 tablet 3  . levothyroxine (SYNTHROID, LEVOTHROID) 125 MCG tablet Take 125 mcg by mouth daily before breakfast.    . loperamide (IMODIUM) 2 MG capsule Take 2-4 mg by mouth 4 (four) times daily as needed for diarrhea or loose stools.    . Magnesium 250 MG TABS Take 250 mg by mouth daily.     . metoprolol succinate (TOPROL-XL) 100 MG 24 hr tablet Take 1 tablet (100 mg total) by mouth  daily. 30 tablet 6  . Omega-3 Fatty Acids (FISH OIL) 1200 MG CAPS Take 1,200 mg by mouth 2 (two) times daily.    . pantoprazole (PROTONIX) 40 MG tablet Take 40 mg by mouth 2 (two) times daily.     . polyethylene glycol (MIRALAX / GLYCOLAX) packet Take 17 g by mouth daily as needed (for constipation.).     Marland Kitchen potassium chloride (K-DUR) 10 MEQ tablet Take 10 mEq by mouth daily as needed. WITH LASIX (FLUID RETENTION) ~EVERY OTHER DAY    . pravastatin (PRAVACHOL) 10 MG tablet Take 10 mg by mouth daily.    . Probiotic Product (ALIGN PO) Take 1 tablet by mouth daily.     . Rivaroxaban  (XARELTO) 15 MG TABS tablet Take 1 tablet (15 mg total) by mouth daily. 30 tablet 6  . sitaGLIPtin (JANUVIA) 50 MG tablet Take 50 mg by mouth daily.    . temazepam (RESTORIL) 15 MG capsule Take 1 capsule at bedtime by mouth.     No current facility-administered medications for this visit.      Past Medical History:  Diagnosis Date  . Amiodarone toxicity   . Anemia   . Chronic bronchitis   . Chronic systolic CHF (congestive heart failure) ([place])    a. 04/2017 Echo: EF 25%.  . Congenital heart disease   . Diabetes mellitus without complication ([place])    Type II  . Diverticulosis   . Fall due to stumbling October 08, 2013   Due to shoes  . GERD (gastroesophageal reflux disease)   . Gout   . Hemorrhoids   . Hyperlipidemia   . Hyperparathyroidism   . Hypertension   . Hypothyroidism   . IDA (iron deficiency anemia)   . Microscopic colitis 9/11   Colonoscopy  . NICM (nonischemic cardiomyopathy) ([place])    a. 04/2017 Echo: EF 25%, basal-mid inferolateral, inf AK, triv AI, mild MR, sev dil LA, mod red RV fxn, mildly dil RA, mild to mod TR, PASP 38mmHg.  . Non-obstructive CAD (coronary artery disease)    a. 04/2017 Cath: LM 20p, LCX nl, RCA 20p/m.  Marland Kitchen Paroxysmal SVT (supraventricular tachycardia) ([place])   . Persistent atrial fibrillation ([place]) 06/2013   a. 04/2017 Admit with CHF/NICM/AFib -> CHA2DS2VASc = 4-->Xarelto 15 mg daily started.  Marland Kitchen PVD (peripheral vascular disease) ([place]) 09/2004   Right common femoral endarterectomy in April of 2007  . Renal insufficiency   . Schatzki's ring    Last EGD with esophageal dilatation 31F  9/11  . Scoliosis   . Varicose veins right leg pain and swelling    ROS:   All systems reviewed and negative except as noted in the HPI.   Past Surgical History:  Procedure Laterality Date  . ASD REPAIR  Age 59   [facility]  . BIOPSY  02/13/2017   Procedure: BIOPSY;  Surgeon: Daneil Dolin, MD;  Location: [facility] ENDO SUITE;   Service: Endoscopy;;  duodenum gastric  . CARDIOVERSION N/A 11/13/2013   Procedure: CARDIOVERSION;  Surgeon: Herminio Commons, MD;  Location: [facility] ORS;  Service: Endoscopy;  Laterality: N/A;  . COLONOSCOPY  03/04/2010   anal canal hemorrhoids otherwise normal. TI normal. Bx showed lymphocytic colitis. next TCS 02/2020  . COLONOSCOPY  April 2008   [place]: Pancolonic diverticulosis, external hemorrhoids  . COLONOSCOPY N/A 11/30/2015   RMR: Normal terminal ileum for 10 cm, nonbleeding grade 1 internal hemorrhoids, colonic diverticulosis. Next colonoscopy in 2027.  Marland Kitchen ELAS  06-01-11  Right saphenous ELAS   . ESOPHAGEAL DILATION N/A 05/28/2015   Procedure: ESOPHAGEAL DILATION;  Surgeon: Daneil Dolin, MD;  Location: [facility] ENDO SUITE;  Service: Endoscopy;  Laterality: N/A;  . ESOPHAGOGASTRODUODENOSCOPY  03/04/2010   noncritical appearing Schatzki ring. SB bx negative  . ESOPHAGOGASTRODUODENOSCOPY  05/2008   erosive reflux esophagatitis, noncritical Schatzki ring  . ESOPHAGOGASTRODUODENOSCOPY N/A 05/28/2015   PFX:TKWIOXBD esophagus somewhat baggy, likely due to underlying esophageal motility disorder/small HH   . ESOPHAGOGASTRODUODENOSCOPY N/A 02/13/2017   Dr. Gala Romney: subtly abnormal stomach of doubtful clinical significant s/p biopsy, normal duodenum s/p biopsy, fundic gland polyp on path, normal duodenal biopsy  . [place]  2010  . Left hemithyroidectomy    . Left parathyroidectomy  2007  . Parathyroid adenoma  2007  . Right common femoral endarterectomy  2007  . RIGHT/LEFT HEART CATH AND CORONARY ANGIOGRAPHY N/A 05/15/2017   Procedure: RIGHT/LEFT HEART CATH AND CORONARY ANGIOGRAPHY;  Surgeon: Troy Sine, MD;  Location: [place] CV LAB;  Service: Cardiovascular;  Laterality: N/A;  . Small bowel capsule  12 2009   Mid to distal small bowel with edema, erosions, tiny ulceration felt to be NSAID related  . TONSILLECTOMY       Family History  Problem Relation Age of Onset  . Stroke  Mother   . Heart disease Mother        CHF  - Amputation-Right Leg  . Hyperlipidemia Mother   . Hypertension Mother   . Congestive Heart Failure Mother   . Lung cancer Father   . Cancer Father        Lung  . Other Brother        back problems  . Heart attack Paternal Grandmother   . Cancer Maternal Grandmother        uterine     Social History   Socioeconomic History  . Marital status: Single    Spouse name: Not on file  . Number of children: 0  . Years of education: Not on file  . Highest education level: Not on file  Social Needs  . Financial resource strain: Not on file  . Food insecurity - worry: Not on file  . Food insecurity - inability: Not on file  . Transportation needs - medical: Not on file  . Transportation needs - non-medical: Not on file  Occupational History  . Occupation: Sales promotion account executive  Tobacco Use  . Smoking status: Never Smoker  . Smokeless tobacco: Never Used  . Tobacco comment: Never smoked  Substance and Sexual Activity  . Alcohol use: No    Alcohol/week: 0.0 oz  . Drug use: No  . Sexual activity: No    Birth control/protection: Post-menopausal  Other Topics Concern  . Not on file  Social History Narrative  . Not on file     BP 110/84   Pulse 100   Ht 4\' 10"  (1.473 m)   Wt 143 lb (64.9 kg)   SpO2 92%   BMI 29.89 kg/m   Physical Exam:  Well appearing 61 year old woman, NAD HEENT: Unremarkable Neck: 6 cm JVD, no thyromegally Lymphatics:  No adenopathy Back:  No CVA tenderness Lungs:  Clear, with no wheezes, rales, or rhonchi. HEART:  IRegular tachycardic rhythm, no murmurs, no rubs, no clicks Abd:  soft, positive bowel sounds, no organomegally, no rebound, no guarding Ext:  2 plus pulses, no edema, no cyanosis, no clubbing Skin:  No rashes no nodules Neuro:  CN II through XII intact, motor grossly  intact  EKG -atrial fibrillation with a rapid ventricular response  Assess/Plan: 1.  Uncontrolled atrial fibrillation -we  discussed the various treatment options today in detail.  Dofetilide therapy would be an option but because of her atrial enlargement, it would be unlikely that she would maintain sinus rhythm long-term.  In addition her QT interval is borderline at best.  As such I recommended proceeding with AV node ablation and insertion of a biventricular pacemaker.  This will be scheduled at the earliest possible convenient time. 2.  Chronic systolic heart failure -her EF by echo was 25%.  I would anticipate improvement in her left ventricular function, and for this reason would recommend biventricular pacemaker insertion. 3.  Amiodarone lung toxicity -while this was not confirmed by biopsy, her pulmonary function testing and CT scan strongly suggested this diagnosis.  Amiodarone has been discontinued.  She has evidence of improvement in her lung function based on her pulmonary function test.  Crissie Sickles, MD

## 2017-06-11 NOTE — H&P (View-Only) (Signed)
HPI [name] returns today for ongoing evaluation and management of her atrial fibrillation and rapid ventricular response.  She is a 61 year old woman with a long history of uncontrolled atrial fibrillation, refractory to all medications except for amiodarone, which maintained her in sinus rhythm but for which she developed evidence of amiodarone lung toxicity.  Her amiodarone was discontinued and in the interim she is redeveloped atrial fibrillation with a rapid ventricular response.  Unfortunately, she has developed a tachycardia induced cardiomyopathy.  Attempts to control her ventricular rate has been difficult. Allergies  Allergen Reactions  . Flecainide Nausea Only and Other (See Comments)    Faint feeling  . Hydrocodone-Acetaminophen Nausea Only and Other (See Comments)    Severe headache  . Ibuprofen Other (See Comments)    Kidney dysfunction  . Oxycodone Hcl Nausea Only and Other (See Comments)    Headache  . Penicillins Nausea Only and Other (See Comments)    Severe headache Has patient had a PCN reaction causing immediate rash, facial/tongue/throat swelling, SOB or lightheadedness with hypotension: No Has patient had a PCN reaction causing severe rash involving mucus membranes or skin necrosis: No Has patient had a PCN reaction that required hospitalization: No Has patient had a PCN reaction occurring within the last 10 years: No If all of the above answers are "NO", then may proceed with Cephalosporin use.      Current Outpatient Medications  Medication Sig Dispense Refill  . acetaminophen (TYLENOL) 500 MG tablet Take 1,000 mg by mouth every 6 (six) hours as needed (for pain.).     Marland Kitchen albuterol (PROVENTIL HFA;VENTOLIN HFA) 108 (90 Base) MCG/ACT inhaler Inhale 2 puffs into the lungs every 6 (six) hours as needed for wheezing or shortness of breath. 1 Inhaler 3  . allopurinol (ZYLOPRIM) 100 MG tablet Take 100 mg by mouth 2 (two) times daily.     . calcium carbonate  (OS-CAL) 600 MG TABS Take 600 mg by mouth 2 (two) times daily with a meal.      . cholecalciferol (VITAMIN D) 1000 UNITS tablet Take 1,000 Units by mouth daily.    . cyclobenzaprine (FLEXERIL) 5 MG tablet Take 1-2 tabs by mouth up to three times daily for muscle spasms. 30 tablet 0  . ferrous sulfate 325 (65 FE) MG tablet Take 325 mg by mouth daily with breakfast.    . furosemide (LASIX) 20 MG tablet TAKE ONE TABLET BY MOUTH DAILY AS NEEDED. (Patient taking differently: TAKE ONE TABLET BY MOUTH DAILY AS NEEDED FOR FLUID RETENTION (~EVERY OTHER DAY)) 30 tablet 3  . gabapentin (NEURONTIN) 100 MG capsule Take 1 capsule 3 (three) times daily by mouth.    Marland Kitchen GLIPIZIDE XL 5 MG 24 hr tablet Take 5 mg by mouth daily before breakfast.    . hydrALAZINE (APRESOLINE) 25 MG tablet Take 0.5 tablets (12.5 mg total) by mouth every 8 (eight) hours. 45 tablet 6  . isosorbide mononitrate (IMDUR) 30 MG 24 hr tablet Take 0.5 tablets (15 mg total) by mouth daily. 30 tablet 3  . levothyroxine (SYNTHROID, LEVOTHROID) 125 MCG tablet Take 125 mcg by mouth daily before breakfast.    . loperamide (IMODIUM) 2 MG capsule Take 2-4 mg by mouth 4 (four) times daily as needed for diarrhea or loose stools.    . Magnesium 250 MG TABS Take 250 mg by mouth daily.     . metoprolol succinate (TOPROL-XL) 100 MG 24 hr tablet Take 1 tablet (100 mg total) by mouth  daily. 30 tablet 6  . Omega-3 Fatty Acids (FISH OIL) 1200 MG CAPS Take 1,200 mg by mouth 2 (two) times daily.    . pantoprazole (PROTONIX) 40 MG tablet Take 40 mg by mouth 2 (two) times daily.     . polyethylene glycol (MIRALAX / GLYCOLAX) packet Take 17 g by mouth daily as needed (for constipation.).     Marland Kitchen potassium chloride (K-DUR) 10 MEQ tablet Take 10 mEq by mouth daily as needed. WITH LASIX (FLUID RETENTION) ~EVERY OTHER DAY    . pravastatin (PRAVACHOL) 10 MG tablet Take 10 mg by mouth daily.    . Probiotic Product (ALIGN PO) Take 1 tablet by mouth daily.     . Rivaroxaban  (XARELTO) 15 MG TABS tablet Take 1 tablet (15 mg total) by mouth daily. 30 tablet 6  . sitaGLIPtin (JANUVIA) 50 MG tablet Take 50 mg by mouth daily.    . temazepam (RESTORIL) 15 MG capsule Take 1 capsule at bedtime by mouth.     No current facility-administered medications for this visit.      Past Medical History:  Diagnosis Date  . Amiodarone toxicity   . Anemia   . Chronic bronchitis   . Chronic systolic CHF (congestive heart failure) ([place])    a. 04/2017 Echo: EF 25%.  . Congenital heart disease   . Diabetes mellitus without complication ([place])    Type II  . Diverticulosis   . Fall due to stumbling October 08, 2013   Due to shoes  . GERD (gastroesophageal reflux disease)   . Gout   . Hemorrhoids   . Hyperlipidemia   . Hyperparathyroidism   . Hypertension   . Hypothyroidism   . IDA (iron deficiency anemia)   . Microscopic colitis 9/11   Colonoscopy  . NICM (nonischemic cardiomyopathy) ([place])    a. 04/2017 Echo: EF 25%, basal-mid inferolateral, inf AK, triv AI, mild MR, sev dil LA, mod red RV fxn, mildly dil RA, mild to mod TR, PASP 77mmHg.  . Non-obstructive CAD (coronary artery disease)    a. 04/2017 Cath: LM 20p, LCX nl, RCA 20p/m.  Marland Kitchen Paroxysmal SVT (supraventricular tachycardia) ([place])   . Persistent atrial fibrillation ([place]) 06/2013   a. 04/2017 Admit with CHF/NICM/AFib -> CHA2DS2VASc = 4-->Xarelto 15 mg daily started.  Marland Kitchen PVD (peripheral vascular disease) ([place]) 09/2004   Right common femoral endarterectomy in April of 2007  . Renal insufficiency   . Schatzki's ring    Last EGD with esophageal dilatation 3F  9/11  . Scoliosis   . Varicose veins right leg pain and swelling    ROS:   All systems reviewed and negative except as noted in the HPI.   Past Surgical History:  Procedure Laterality Date  . ASD REPAIR  Age 68   [facility]  . BIOPSY  02/13/2017   Procedure: BIOPSY;  Surgeon: Daneil Dolin, MD;  Location: [facility] END[facility] SU[facility];   Service: Endoscopy;;  duodenum gastric  . CARDIOVERSION N/A 11/13/2013   Procedure: CARDIOVERSION;  Surgeon: Herminio Commons, MD;  Location: [facility] ORS;  Service: Endoscopy;  Laterality: N/A;  . COLONOSCOPY  03/04/2010   anal canal hemorrhoids otherwise normal. TI normal. Bx showed lymphocytic colitis. next TCS 02/2020  . COLONOSCOPY  April 2008   [facility]: Pancolonic diverticulosis, external hemorrhoids  . COLONOSCOPY N/A 11/30/2015   RMR: Normal terminal ileum for 10 cm, nonbleeding grade 1 internal hemorrhoids, colonic diverticulosis. Next colonoscopy in 2027.  Marland Kitchen ELAS  06-01-11  Right saphenous ELAS   . ESOPHAGEAL DILATION N/A 05/28/2015   Procedure: ESOPHAGEAL DILATION;  Surgeon: Daneil Dolin, MD;  Location: [facility];  Service: Endoscopy;  Laterality: N/A;  . ESOPHAGOGASTRODUODENOSCOPY  03/04/2010   noncritical appearing Schatzki ring. SB bx negative  . ESOPHAGOGASTRODUODENOSCOPY  05/2008   erosive reflux esophagatitis, noncritical Schatzki ring  . ESOPHAGOGASTRODUODENOSCOPY N/A 05/28/2015   QIO:[id number] esophagus somewhat baggy, likely due to underlying esophageal motility disorder/small HH   . ESOPHAGOGASTRODUODENOSCOPY N/A 02/13/2017   Dr. Gala Romney: subtly abnormal stomach of doubtful clinical significant s/p biopsy, normal duodenum s/p biopsy, fundic gland polyp on path, normal duodenal biopsy  . Mar-Mac  2010  . Left hemithyroidectomy    . Left parathyroidectomy  2007  . Parathyroid adenoma  2007  . Right common femoral endarterectomy  2007  . RIGHT/LEFT HEART CATH AND CORONARY ANGIOGRAPHY N/A 05/15/2017   Procedure: RIGHT/LEFT HEART CATH AND CORONARY ANGIOGRAPHY;  Surgeon: Troy Sine, MD;  Location: [facility] LAB;  Service: Cardiovascular;  Laterality: N/A;  . Small bowel capsule  12 2009   Mid to distal small bowel with edema, erosions, tiny ulceration felt to be NSAID related  . TONSILLECTOMY       Family History  Problem Relation Age of Onset  . Stroke  Mother   . Heart disease Mother        CHF  - Amputation-Right Leg  . Hyperlipidemia Mother   . Hypertension Mother   . Congestive Heart Failure Mother   . Lung cancer Father   . Cancer Father        Lung  . Other Brother        back problems  . Heart attack Paternal Grandmother   . Cancer Maternal Grandmother        uterine     Social History   Socioeconomic History  . Marital status: Single    Spouse name: Not on file  . Number of children: 0  . Years of education: Not on file  . Highest education level: Not on file  Social Needs  . Financial resource strain: Not on file  . Food insecurity - worry: Not on file  . Food insecurity - inability: Not on file  . Transportation needs - medical: Not on file  . Transportation needs - non-medical: Not on file  Occupational History  . Occupation: Sales promotion account executive  Tobacco Use  . Smoking status: Never Smoker  . Smokeless tobacco: Never Used  . Tobacco comment: Never smoked  Substance and Sexual Activity  . Alcohol use: No    Alcohol/week: 0.0 oz  . Drug use: No  . Sexual activity: No    Birth control/protection: Post-menopausal  Other Topics Concern  . Not on file  Social History Narrative  . Not on file     BP 110/84   Pulse 100   Ht 4\' 10"  (1.473 m)   Wt 143 lb (64.9 kg)   SpO2 92%   BMI 29.89 kg/m   Physical Exam:  Well appearing 61 year old woman, NAD HEENT: Unremarkable Neck: 6 cm JVD, no thyromegally Lymphatics:  No adenopathy Back:  No CVA tenderness Lungs:  Clear, with no wheezes, rales, or rhonchi. HEART:  IRegular tachycardic rhythm, no murmurs, no rubs, no clicks Abd:  soft, positive bowel sounds, no organomegally, no rebound, no guarding Ext:  2 plus pulses, no edema, no cyanosis, no clubbing Skin:  No rashes no nodules Neuro:  CN II through XII intact, motor grossly  intact  EKG -atrial fibrillation with a rapid ventricular response  Assess/Plan: 1.  Uncontrolled atrial fibrillation -we  discussed the various treatment options today in detail.  Dofetilide therapy would be an option but because of her atrial enlargement, it would be unlikely that she would maintain sinus rhythm long-term.  In addition her QT interval is borderline at best.  As such I recommended proceeding with AV node ablation and insertion of a biventricular pacemaker.  This will be scheduled at the earliest possible convenient time. 2.  Chronic systolic heart failure -her EF by echo was 25%.  I would anticipate improvement in her left ventricular function, and for this reason would recommend biventricular pacemaker insertion. 3.  Amiodarone lung toxicity -while this was not confirmed by biopsy, her pulmonary function testing and CT scan strongly suggested this diagnosis.  Amiodarone has been discontinued.  She has evidence of improvement in her lung function based on her pulmonary function test.  Crissie Sickles, MD

## 2017-06-11 NOTE — Patient Instructions (Signed)
Medication Instructions:  Your physician recommends that you continue on your current medications as directed. Please refer to the Current Medication list given to you today.   Labwork: Your physician recommends that you return for lab work in: 06/22/18   Testing/Procedures: NONE   Follow-Up: Your physician recommends that you schedule a follow-up appointment in: To Be Determined   Any Other Special Instructions Will Be Listed Below (If Applicable).  Your physician has recommended that you have an ablation. Catheter ablation is a medical procedure used to treat some cardiac arrhythmias (irregular heartbeats). During catheter ablation, a long, thin, flexible tube is put into a blood vessel in your groin (upper thigh), or neck. This tube is called an ablation catheter. It is then guided to your heart through the blood vessel. Radio frequency waves destroy small areas of heart tissue where abnormal heartbeats may cause an arrhythmia to start. Please see the instruction sheet given to you today.  Your physician has recommended that you have a defibrillator inserted. An implantable cardioverter defibrillator (ICD) is a small device that is placed in your chest or, in rare cases, your abdomen. This device uses electrical pulses or shocks to help control life-threatening, irregular heartbeats that could lead the heart to suddenly stop beating (sudden cardiac arrest). Leads are attached to the ICD that goes into your heart. This is done in the hospital and usually requires an overnight stay. Please see the instruction sheet given to you today for more information.      If you need a refill on your cardiac medications before your next appointment, please call your pharmacy. Thank you for choosing [facility]!

## 2017-06-12 ENCOUNTER — Ambulatory Visit (HOSPITAL_COMMUNITY)
Admission: RE | Admit: 2017-06-12 | Discharge: 2017-06-12 | Disposition: A | Discharge status: Home / Self Care | Payer: PPO | Source: Ambulatory Visit | Attending: Adult Health | Admitting: Adult Health

## 2017-06-12 ENCOUNTER — Telehealth: Payer: Self-pay | Admitting: Pulmonary Disease

## 2017-06-12 DIAGNOSIS — I700 Atherosclerosis of aorta: Secondary | ICD-10-CM | POA: Insufficient documentation

## 2017-06-12 DIAGNOSIS — J811 Chronic pulmonary edema: Secondary | ICD-10-CM | POA: Insufficient documentation

## 2017-06-12 DIAGNOSIS — I2510 Atherosclerotic heart disease of native coronary artery without angina pectoris: Secondary | ICD-10-CM | POA: Insufficient documentation

## 2017-06-12 DIAGNOSIS — I517 Cardiomegaly: Secondary | ICD-10-CM | POA: Diagnosis not present

## 2017-06-12 DIAGNOSIS — J90 Pleural effusion, not elsewhere classified: Secondary | ICD-10-CM | POA: Insufficient documentation

## 2017-06-12 DIAGNOSIS — J849 Interstitial pulmonary disease, unspecified: Secondary | ICD-10-CM | POA: Diagnosis not present

## 2017-06-12 NOTE — Telephone Encounter (Signed)
Order that was placed for today's CT was for a regular CT chest without contrast when suppose to be HRCT. Changed CT to HRCT. Called and spoke to [place] with [place] [facility], she will relay the message to [place]. Nothing further needed at this time.

## 2017-06-13 ENCOUNTER — Encounter: Payer: Self-pay | Admitting: Pulmonary Disease

## 2017-06-13 ENCOUNTER — Ambulatory Visit (HOSPITAL_COMMUNITY): Payer: PPO

## 2017-06-13 ENCOUNTER — Ambulatory Visit: Payer: PPO | Admitting: Pulmonary Disease

## 2017-06-13 DIAGNOSIS — R002 Palpitations: Secondary | ICD-10-CM | POA: Diagnosis not present

## 2017-06-13 DIAGNOSIS — I428 Other cardiomyopathies: Secondary | ICD-10-CM | POA: Diagnosis not present

## 2017-06-13 DIAGNOSIS — M5430 Sciatica, unspecified side: Secondary | ICD-10-CM | POA: Diagnosis not present

## 2017-06-13 DIAGNOSIS — I5042 Chronic combined systolic (congestive) and diastolic (congestive) heart failure: Secondary | ICD-10-CM | POA: Diagnosis not present

## 2017-06-13 DIAGNOSIS — N183 Chronic kidney disease, stage 3 (moderate): Secondary | ICD-10-CM | POA: Diagnosis not present

## 2017-06-13 DIAGNOSIS — E782 Mixed hyperlipidemia: Secondary | ICD-10-CM | POA: Diagnosis not present

## 2017-06-13 DIAGNOSIS — I481 Persistent atrial fibrillation: Secondary | ICD-10-CM | POA: Diagnosis not present

## 2017-06-13 DIAGNOSIS — I10 Essential (primary) hypertension: Secondary | ICD-10-CM | POA: Diagnosis not present

## 2017-06-13 DIAGNOSIS — E039 Hypothyroidism, unspecified: Secondary | ICD-10-CM | POA: Diagnosis not present

## 2017-06-13 DIAGNOSIS — J849 Interstitial pulmonary disease, unspecified: Secondary | ICD-10-CM | POA: Diagnosis not present

## 2017-06-13 DIAGNOSIS — N184 Chronic kidney disease, stage 4 (severe): Secondary | ICD-10-CM | POA: Diagnosis not present

## 2017-06-13 DIAGNOSIS — M545 Low back pain: Secondary | ICD-10-CM | POA: Diagnosis not present

## 2017-06-13 DIAGNOSIS — Z712 Person consulting for explanation of examination or test findings: Secondary | ICD-10-CM | POA: Diagnosis not present

## 2017-06-13 DIAGNOSIS — D509 Iron deficiency anemia, unspecified: Secondary | ICD-10-CM | POA: Diagnosis not present

## 2017-06-13 DIAGNOSIS — Z9181 History of falling: Secondary | ICD-10-CM | POA: Diagnosis not present

## 2017-06-13 DIAGNOSIS — M5440 Lumbago with sciatica, unspecified side: Secondary | ICD-10-CM | POA: Diagnosis not present

## 2017-06-13 DIAGNOSIS — R0602 Shortness of breath: Secondary | ICD-10-CM | POA: Diagnosis not present

## 2017-06-13 DIAGNOSIS — G9009 Other idiopathic peripheral autonomic neuropathy: Secondary | ICD-10-CM | POA: Diagnosis not present

## 2017-06-13 DIAGNOSIS — E1122 Type 2 diabetes mellitus with diabetic chronic kidney disease: Secondary | ICD-10-CM | POA: Diagnosis not present

## 2017-06-13 DIAGNOSIS — Z6831 Body mass index (BMI) 31.0-31.9, adult: Secondary | ICD-10-CM | POA: Diagnosis not present

## 2017-06-13 DIAGNOSIS — K219 Gastro-esophageal reflux disease without esophagitis: Secondary | ICD-10-CM | POA: Diagnosis not present

## 2017-06-13 NOTE — Assessment & Plan Note (Signed)
Likely amiodarone toxicity, now resolved. Amiodarone was stopped 10/2016. Repeat CT yesterday showed resolution of interstitial findings. Repeat PFTs at her last visit on 05/24/17 with a stable restrictive process. Continue to monitor clinically. Her major concern at this point is her persistent atrial fibrillation and tachycardia induced myopathy with reduced EF 25%. She is following with cardiology, planning for AV node ablation and pacemaker implantation in the near future. Avoid amiodarone. Follow up 6 months.

## 2017-06-13 NOTE — Patient Instructions (Signed)
  CT scan shows that the effect of amiodarone on your lungs has subsided. You do have some fluid buildup-take Lasix daily until weight drops at least 1-2 pounds

## 2017-06-13 NOTE — Progress Notes (Signed)
   Subjective:    Patient ID: [name], female    DOB: 18-Oct-1955, 61 y.o.   MRN: [id number]   HPI  06/13/2017 Follow-up (follow up after CT scan. )  [name] is a 61 yo F never smoker here for follow up of likely amiodarone lung toxicity. She has a history of A fib since 2015 s/p DCCV and amiodarone therapy. She had interstitial changes on her CT chest and restrictive disease on PFTs. Amiodarone was stopped 10/2016. Repeat PFTs at her last visit on 05/24/17 were stable (FEV1 67%, ratio 85, FVC 60%, DLCO 56%). Repeat CT yesterday 12/18 showed resolution of prior interstitial findings.   She reports on going SOB and DOE. Some wheezing. Recent episode of a-fib with RVR and CHF exacerbation last month requiring hospitalization. Repeat echo showed reduced EF of 25%. She was diuresed and started on renally dosed xarelto for CHA2DS2VASc score of 4. She followed up with her cardiologist Dr. Lovena Le yesterday who is planning for AV node ablation and pacemaker in the near future. Unfortunately she has been refractory to all medications except amiodarone, on which she developed lung toxicity and was discontinued. She has now developed persistent afib and tachycardia induced cardiomyopathy.   She is taking metoprolol and lasix 20 mg daily, skipped her dose of lasix this morning.    ROS: Review of Systems  Constitutional: Negative for chills and fever.  Respiratory: Positive for shortness of breath and wheezing.   Cardiovascular: Positive for palpitations and leg swelling. Negative for chest pain.  Gastrointestinal: Negative for abdominal pain.  Neurological: Negative for loss of consciousness.  Psychiatric/Behavioral: Negative for depression.        Objective:  BP 118/82 (BP Location: Left Arm, Patient Position: Sitting, Cuff Size: Normal)   Pulse (!) 134   Ht 4\' 10"  (1.473 m)   Wt 143 lb (64.9 kg)   SpO2 90%   BMI 29.89 kg/m  Filed Weights   06/13/17 1328  Weight: 143 lb (64.9  kg)    Physical Exam Constitutional: NAD, appears comfortable HEENT: Atraumatic, normocephalic. PERRL, anicteric sclera.  Neck: Supple, trachea midline.  Cardiovascular: Tachycardic and irregular, no murmurs, rubs, or gallops.  Pulmonary/Chest: CTAB, no wheezes, rales, or rhonchi.  Abdominal: Soft, non tender, non distended. +BS.  Extremities: Warm and well perfused. Distal pulses intact. 1-2+ pitting edema bilaterally  Neurological: A&Ox3, CN II - XII grossly intact.  Skin: No rashes or erythema  Psychiatric: Normal mood and affect        Assessment & Plan:  See problem based charting  Return in about 6 months (around 12/12/2017) for TP.

## 2017-06-15 ENCOUNTER — Other Ambulatory Visit: Payer: Self-pay | Admitting: *Deleted

## 2017-06-15 NOTE — Patient Outreach (Signed)
[facility]) Care Management  06/15/2017  JORJA EMPIE 07-01-1955 [id number]  RN Health Coach Initial Assessment  Referral Date:  06/05/17 Referral Source:  [facility] Calls Reason for Referral:  Disease Management Education Insurance:   [employer]   Outreach Attempt: Successful telephone outreach to patient to follow up chest pain and contacting her Cardiologist.  HIPAA verified.  Patient states she called Dr. Tanna Furry office and spoke with someone.  She states she was told they would call her in a prescription for Nitroglycerin.  Patient states she is familiar with how to use Nitroglycerin from her prescription in the past.  Encouraged patient to take Nitroglycerin as prescribed for chest pain and to contact her Cardiologist again if the pain continues.  Patient stated her understanding.  Plan:  RN Health Coach will make another attempt for telephone initial assessment within 10 business days, once patient feels better.  [facility] [phone] [email or web address]

## 2017-06-15 NOTE — Patient Outreach (Signed)
[facility]) Care Management  06/15/2017  [name] [birth year] [id number]  RN Health Coach Initial Assessment  Referral Date:  06/05/17 Referral Source:  [facility] Calls Reason for Referral:  Disease Management Education Insurance:   [employer]   Outreach Attempt: Successful telephone outreach to patient for initial telephone assessment.  HIPAA verified.  Upon speaking to patient, patient stated she has been having left sided chest pain.  Pain started this morning around 3:30 initially on both sides of her chest and now still on the left side.  Patient states she also feels her heart is racing.  Asked patient if she has contacted her physician, and patient stated know.  RN Health Coach requested patient to call her Cardiologist office and to speak with a nurse to see if she could be seen in the office today, as soon as possible regarding her chest pain and heart racing.  Patient agreed and stated she would call her Cardiologist office now.    Plan:  RN Health Coach to follow back up with patient today to see if she got an appointment with her Cardiologist or what the recommendations where from her physician.   [facility] [phone] [email or web address]

## 2017-06-21 ENCOUNTER — Other Ambulatory Visit: Payer: Self-pay | Admitting: *Deleted

## 2017-06-21 DIAGNOSIS — R000 Tachycardia, unspecified: Secondary | ICD-10-CM | POA: Diagnosis not present

## 2017-06-21 DIAGNOSIS — S39012A Strain of muscle, fascia and tendon of lower back, initial encounter: Secondary | ICD-10-CM | POA: Diagnosis not present

## 2017-06-21 NOTE — Patient Outreach (Signed)
[facility]) Care Management  06/21/2017  [name] [birth year] [id number]  RN Health Coach Initial Assessment  Referral Date:06/05/17 Referral Source:[facility] Calls Reason for Referral:Disease Management Education Insurance:[employer]  Outreach Attempt:  Outreach attempt #1 to patient to complete initial telephone assessment. No answer. RN Health Coach left HIPAA compliant voicemail message along with contact information.  Plan:  RN Health Coach will make another attempt to complete initial telephone assessment within 3 business days.   [place] [phone] [name]

## 2017-06-22 ENCOUNTER — Other Ambulatory Visit: Payer: Self-pay | Admitting: *Deleted

## 2017-06-22 DIAGNOSIS — D508 Other iron deficiency anemias: Secondary | ICD-10-CM | POA: Diagnosis not present

## 2017-06-22 DIAGNOSIS — Z01818 Encounter for other preprocedural examination: Secondary | ICD-10-CM | POA: Diagnosis not present

## 2017-06-22 NOTE — Patient Outreach (Signed)
[facility]) Care Management  06/22/2017  BARBRA MINER 03-29-1956 [id number]  RN Health Coach Initial Assessment  Referral Date:06/05/17 Referral Source:[facility] Calls Reason for Referral:Disease Management Education Insurance:[employer]  Outreach Attempt:  Outreach attempt #2 to patient for initial telephone assessment. No answer. RN Health Coach left HIPAA compliant voicemail message along with contact information.  Plan:  RN Health Coach will attempt another outreach to patient within 5 business days.   [place] [phone] [email or web address]

## 2017-06-23 LAB — CBC WITH DIFFERENTIAL/PLATELET
BASOS PCT: 1.1 %
Basophils Absolute: 94 cells/uL (ref 0–200)
EOS ABS: 238 {cells}/uL (ref 15–500)
Eosinophils Relative: 2.8 %
HCT: 37.2 % (ref 35.0–45.0)
HEMOGLOBIN: 11.5 g/dL — AB (ref 11.7–15.5)
LYMPHS ABS: 2448 {cells}/uL (ref 850–3900)
MCH: 28.3 pg (ref 27.0–33.0)
MCHC: 30.9 g/dL — ABNORMAL LOW (ref 32.0–36.0)
MCV: 91.4 fL (ref 80.0–100.0)
MPV: 11.5 fL (ref 7.5–12.5)
Monocytes Relative: 9.9 %
NEUTROS ABS: 4879 {cells}/uL (ref 1500–7800)
Neutrophils Relative %: 57.4 %
PLATELETS: 265 10*3/uL (ref 140–400)
RBC: 4.07 10*6/uL (ref 3.80–5.10)
RDW: 14.7 % (ref 11.0–15.0)
Total Lymphocyte: 28.8 %
WBC: 8.5 10*3/uL (ref 3.8–10.8)
WBCMIX: 842 {cells}/uL (ref 200–950)

## 2017-06-23 LAB — PROTIME-INR
INR: 1.3 — AB
Prothrombin Time: 13.6 s — ABNORMAL HIGH (ref 9.0–11.5)

## 2017-06-23 LAB — BASIC METABOLIC PANEL
BUN / CREAT RATIO: 23 (calc) — AB (ref 6–22)
BUN: 37 mg/dL — ABNORMAL HIGH (ref 7–25)
CHLORIDE: 106 mmol/L (ref 98–110)
CO2: 22 mmol/L (ref 20–32)
CREATININE: 1.64 mg/dL — AB (ref 0.50–0.99)
Calcium: 9.7 mg/dL (ref 8.6–10.4)
GLUCOSE: 176 mg/dL — AB (ref 65–139)
Potassium: 4.3 mmol/L (ref 3.5–5.3)
SODIUM: 140 mmol/L (ref 135–146)

## 2017-06-27 ENCOUNTER — Encounter: Payer: Self-pay | Admitting: *Deleted

## 2017-06-27 ENCOUNTER — Other Ambulatory Visit: Payer: Self-pay

## 2017-06-27 ENCOUNTER — Other Ambulatory Visit: Payer: Self-pay | Admitting: *Deleted

## 2017-06-27 NOTE — Patient Outreach (Signed)
[facility]) Care Management  [place]  06/27/2017   [name] 16-Oct-1955 [id number]  RN Health Coach Initial Assessment   Referral Date:  06/05/17 Referral Source:  [facility] Calls Reason for Referral:  Disease Management Education Insurance:   [employer]  Outreach Attempt:  Successful telephone outreach to patient for initial telephone assessment.  HIPAA verified.  Initial telephone assessment completed with patient.  Social:  Patient states she lives at home alone, in a duplex apartment with her brother staying next door.  She states her brother is available to provide assistance if needed.  Independent with her ADLs and IADLs.  Reports she still drives and transports herself to her medical appointments.  DME in the home include: scale, CBG meter, quad cane, eye glasses, and bilateral hearing aides (left hearing aide transmits to right hearing aide).  Conditions:  Per chart review and discussion with patient, PMH include:  Atrial fibrillation, congestive heart failure, chronic kidney disease, GERD, peripheral vascular disease, diabetes, anemia, interstitial lung disease, coronary artery disease, amiodarone lung toxicity, dysphagia, obstructive sleep apnea, and acute bronchitis.  Patient reports having a fall in June of 2018 bruising her right side and continues to have back pain.  States she has been taking tylenol and using heating pad to help relieve pain.  Encouraged patient to discuss continued back pain with her primary care provider.  Patient states she is not currently having chest pain and has picked up prescription for SL NTG per our last conversation.  Does verbalize she continues to feel her heart racing with activity and at rest at times.  States when her heart races she gets very short of breath also.  Denies any swelling in her lower extremities.  Patient scheduled for Ablation and BiVentricular Pacemaker placement on 06/29/2017.  Patient able to  verbalize, Physician instructed her to hold her xarelto starting today for procedure.  Reports weighing herself about 5 times a week.  Did not weigh this morning.  Yesterdays weight was 143 pounds.  Reports taking her blood sugar daily.  Did not take blood sugar this morning, but states CBG has ranged from 90-110's.  Patient reports her last A1C was checked last week and it was 6.1.  Last documented A1C in the system is   Medications:  Patient stating she manages her home medications.  Verbalizes taking about 25 medications.  Does verbalize she can not afford her co pay for Januvia and has not been taking.  States she has tried to get samples from her medical physician's office and has not been able to lately.  Patient states she has been able to afford her other medications.  Discussed [place] services and patient agrees.  Encounter Medications:  Outpatient Encounter Medications as of 06/27/2017  Medication Sig Note  . acetaminophen (TYLENOL) 500 MG tablet Take 1,000 mg by mouth every 6 (six) hours as needed (for pain.).    Marland Kitchen albuterol (PROVENTIL HFA;VENTOLIN HFA) 108 (90 Base) MCG/ACT inhaler Inhale 2 puffs into the lungs every 6 (six) hours as needed for wheezing or shortness of breath.   . allopurinol (ZYLOPRIM) 100 MG tablet Take 100 mg by mouth 2 (two) times daily.    . calcium carbonate (OS-CAL) 600 MG TABS Take 600 mg by mouth 2 (two) times daily with a meal.     . cholecalciferol (VITAMIN D) 1000 UNITS tablet Take 1,000 Units by mouth daily.   . cyclobenzaprine (FLEXERIL) 5 MG tablet Take 1-2 tabs by mouth  up to three times daily for muscle spasms.   . ferrous sulfate 325 (65 FE) MG tablet Take 325 mg by mouth daily with breakfast.   . furosemide (LASIX) 20 MG tablet TAKE ONE TABLET BY MOUTH DAILY AS NEEDED. (Patient taking differently: TAKE ONE TABLET BY MOUTH DAILY)   . gabapentin (NEURONTIN) 100 MG capsule Take 100 mg by mouth 3 (three) times daily.    Marland Kitchen GLIPIZIDE XL 5 MG 24 hr tablet  Take 5 mg by mouth daily before breakfast.   . hydrALAZINE (APRESOLINE) 25 MG tablet Take 0.5 tablets (12.5 mg total) by mouth every 8 (eight) hours.   . isosorbide mononitrate (IMDUR) 30 MG 24 hr tablet Take 0.5 tablets (15 mg total) by mouth daily.   Marland Kitchen levothyroxine (SYNTHROID, LEVOTHROID) 125 MCG tablet Take 125 mcg by mouth daily before breakfast.   . loperamide (IMODIUM) 2 MG capsule Take 2-4 mg by mouth 4 (four) times daily as needed for diarrhea or loose stools.   . Magnesium 250 MG TABS Take 250 mg by mouth daily.    . metoprolol succinate (TOPROL-XL) 100 MG 24 hr tablet Take 1 tablet (100 mg total) by mouth daily.   . Omega-3 Fatty Acids (FISH OIL) 1200 MG CAPS Take 1,200 mg by mouth 2 (two) times daily.   . pantoprazole (PROTONIX) 40 MG tablet Take 40 mg by mouth 2 (two) times daily.    . polyethylene glycol (MIRALAX / GLYCOLAX) packet Take 17 g by mouth daily as needed (for constipation.).    Marland Kitchen potassium chloride (K-DUR) 10 MEQ tablet Take 10 mEq by mouth daily. WITH LASIX (FLUID RETENTION) ~EVERY OTHER DAY   . pravastatin (PRAVACHOL) 10 MG tablet Take 10 mg by mouth daily.   . Probiotic Product (ALIGN PO) Take 1 tablet by mouth daily.    . Rivaroxaban (XARELTO) 15 MG TABS tablet Take 1 tablet (15 mg total) by mouth daily.   . temazepam (RESTORIL) 15 MG capsule Take 15 mg by mouth at bedtime as needed for sleep.    Marland Kitchen losartan (COZAAR) 50 MG tablet Take 50 mg by mouth daily. 06/27/2017: Patient reports not taking  . sitaGLIPtin (JANUVIA) 50 MG tablet Take 50 mg by mouth daily. 06/27/2017: Have been trying to get samples from MD office for this medication, can not afford copay   No facility-administered encounter medications on file as of 06/27/2017.     Functional Status:  In your present state of health, do you have any difficulty performing the following activities: 06/27/2017 05/21/2017  Hearing? Y N  Comment total hearing loss to left ear, bilateral hearing aides -  Vision? Y N   Comment wears glasses -  Difficulty concentrating or making decisions? N N  Walking or climbing stairs? Y Y  Comment trouble climbing stairs due to shortness of breath -  Dressing or bathing? N N  Doing errands, shopping? N N  Preparing Food and eating ? N N  Using the Toilet? N N  In the past six months, have you accidently leaked urine? N N  Do you have problems with loss of bowel control? Y N  Managing your Medications? N Y  Managing your Finances? N Y  Housekeeping or managing your Housekeeping? N Y  Some recent data might be hidden    Fall/Depression Screening: Fall Risk  06/27/2017 05/21/2017  Falls in the past year? Yes Yes  Comment - back in june  Number falls in past yr: 1 1  Injury with Fall? No  Yes  Risk for fall due to : History of fall(s);Medication side effect -  Follow up Education provided;Falls prevention discussed Falls prevention discussed   PHQ 2/9 Scores 06/27/2017 05/21/2017 12/05/2016  PHQ - 2 Score 1 0 0   THN CM Care Plan Problem One     Most Recent Value  Care Plan Problem One  Knowledge deficiet related to self cae management of congestive heart failure  Role Documenting the Problem One  [facility] for Problem One  Active  THN Long Term Goal   Patient will report no unplanned hospitalizations in the next 90 days.  THN Long Term Goal Start Date  06/27/17  Interventions for Problem One Long Term Goal  Current care plan reviewed and discussed with patient,  Encouraged patient to keep scheduled medical appointments and schedules procedural appointment for ablation and biventricular pacemaker placement,  encouraged patient to weigh daily, discussed signs and symptoms of heart failure with patient,  educated patient on when to call physician,  Sendign patient Atrial fibrillation educational booklet  [facility] CM Short Term Goal #1   Patient will verbalize at least 4 signs and symptoms of heart failure within the next 30 days.  THN CM Short Term Goal #1  Start Date  06/27/17  Interventions for Short Term Goal #1  Living Better with Heart Failure educational booklet sent to patient, reviewed signs and symptoms of heart failure with patient,  discussed atrial fibrillation as it relates to heart failure with patient,  encouraged patient to weigh herself daily  THN CM Short Term Goal #2   Patient will weigh herself daily ffor the next 30 days.  THN CM Short Term Goal #2 Start Date  06/27/17  Interventions for Short Term Goal #2  Discussed the benefits in weighing daily instead of a few times a week, discussed when to call the physician as it relates to weighing daily, reviewed signs and symptoms of heart failure with patient     Appointments:  Patient's health care providers includes:  Dr. Delaine Lame (Primary Care), Dr. Harl Bowie (Cardiologist), Dr. Lovena Le (Electrophysiologist), and Dr. Elsworth Soho (Pulmonologist).  Patient states she has seen her Primary Care Provider around 06/20/2017.  States she believes her follow up appointment is in February 2019 or March 2019 and office will call and confirm appointment with her.  Patient is scheduled for Outpatient/Overnight procedure of Ablation and BiVentricular Pacemaker implantation on 06/29/2017 with Dr. Lovena Le.  Patient seen Dr. Elsworth Soho on 06/13/2017.  Advanced Directives:  Patient denies having an advanced directive in place.  Request information on creating an advanced directive.   Consent:  [facility] services reviewed and discussed with patient.  Patient verbally agrees to [place] monthly outreaches and [place] medication assistance and medication reconciliation.  Plan: RN Health Coach will notify [facility] administrative assistant of case status. RN Health Coach will send successful outreach letter to patient with introductory [facility] welcome package. RN Health Coach will send primary MD barriers letter. RN Health Coach will route Initial Assessment note to primary MD. [place] will send [facility]  pharmacy referral for medication reconciliation and medication assistance. RN Health Coach will send patient Emergency planning/management officer. RN Health Coach will send patient Living Better with Heart Failure Educational Packet. RN Health Coach will send patient Atrial Fibrillation Educational Booklet. RN Health Coach will send patient 2019 Calendar Booklet to help with documentation of daily weights and CBG monitoring. RN Health Coach will make another telephone outreach to  patient within the next 14 business days to follow up after Ablation and BiVentricular Pacemaker procedure.   [place] [phone] [email or web address]

## 2017-06-29 ENCOUNTER — Encounter (HOSPITAL_COMMUNITY)
Admission: RE | Disposition: A | Discharge status: Home / Self Care | Payer: Self-pay | Source: Ambulatory Visit | Attending: Internal Medicine

## 2017-06-29 ENCOUNTER — Ambulatory Visit (HOSPITAL_COMMUNITY)
Admission: RE | Admit: 2017-06-29 | Discharge: 2017-06-30 | Disposition: A | Discharge status: Home / Self Care | Payer: PPO | Source: Ambulatory Visit | Attending: Internal Medicine | Admitting: Internal Medicine

## 2017-06-29 DIAGNOSIS — Z880 Allergy status to penicillin: Secondary | ICD-10-CM | POA: Insufficient documentation

## 2017-06-29 DIAGNOSIS — Z888 Allergy status to other drugs, medicaments and biological substances status: Secondary | ICD-10-CM | POA: Insufficient documentation

## 2017-06-29 DIAGNOSIS — Z7901 Long term (current) use of anticoagulants: Secondary | ICD-10-CM | POA: Diagnosis not present

## 2017-06-29 DIAGNOSIS — Z8719 Personal history of other diseases of the digestive system: Secondary | ICD-10-CM | POA: Insufficient documentation

## 2017-06-29 DIAGNOSIS — E785 Hyperlipidemia, unspecified: Secondary | ICD-10-CM | POA: Diagnosis not present

## 2017-06-29 DIAGNOSIS — D509 Iron deficiency anemia, unspecified: Secondary | ICD-10-CM | POA: Insufficient documentation

## 2017-06-29 DIAGNOSIS — Z7984 Long term (current) use of oral hypoglycemic drugs: Secondary | ICD-10-CM | POA: Diagnosis not present

## 2017-06-29 DIAGNOSIS — I482 Chronic atrial fibrillation: Secondary | ICD-10-CM | POA: Insufficient documentation

## 2017-06-29 DIAGNOSIS — Z9889 Other specified postprocedural states: Secondary | ICD-10-CM | POA: Insufficient documentation

## 2017-06-29 DIAGNOSIS — I4891 Unspecified atrial fibrillation: Secondary | ICD-10-CM | POA: Diagnosis not present

## 2017-06-29 DIAGNOSIS — M419 Scoliosis, unspecified: Secondary | ICD-10-CM | POA: Diagnosis not present

## 2017-06-29 DIAGNOSIS — Z885 Allergy status to narcotic agent status: Secondary | ICD-10-CM | POA: Insufficient documentation

## 2017-06-29 DIAGNOSIS — M109 Gout, unspecified: Secondary | ICD-10-CM | POA: Diagnosis not present

## 2017-06-29 DIAGNOSIS — Z950 Presence of cardiac pacemaker: Secondary | ICD-10-CM

## 2017-06-29 DIAGNOSIS — J42 Unspecified chronic bronchitis: Secondary | ICD-10-CM | POA: Insufficient documentation

## 2017-06-29 DIAGNOSIS — K219 Gastro-esophageal reflux disease without esophagitis: Secondary | ICD-10-CM | POA: Diagnosis not present

## 2017-06-29 DIAGNOSIS — T462X1A Poisoning by other antidysrhythmic drugs, accidental (unintentional), initial encounter: Secondary | ICD-10-CM | POA: Insufficient documentation

## 2017-06-29 DIAGNOSIS — E039 Hypothyroidism, unspecified: Secondary | ICD-10-CM | POA: Diagnosis not present

## 2017-06-29 DIAGNOSIS — I2510 Atherosclerotic heart disease of native coronary artery without angina pectoris: Secondary | ICD-10-CM | POA: Diagnosis not present

## 2017-06-29 DIAGNOSIS — E1151 Type 2 diabetes mellitus with diabetic peripheral angiopathy without gangrene: Secondary | ICD-10-CM | POA: Insufficient documentation

## 2017-06-29 DIAGNOSIS — I471 Supraventricular tachycardia: Secondary | ICD-10-CM | POA: Diagnosis not present

## 2017-06-29 DIAGNOSIS — I110 Hypertensive heart disease with heart failure: Secondary | ICD-10-CM | POA: Diagnosis not present

## 2017-06-29 DIAGNOSIS — I442 Atrioventricular block, complete: Secondary | ICD-10-CM | POA: Insufficient documentation

## 2017-06-29 DIAGNOSIS — I5022 Chronic systolic (congestive) heart failure: Secondary | ICD-10-CM | POA: Diagnosis not present

## 2017-06-29 DIAGNOSIS — Z79899 Other long term (current) drug therapy: Secondary | ICD-10-CM | POA: Diagnosis not present

## 2017-06-29 DIAGNOSIS — I429 Cardiomyopathy, unspecified: Secondary | ICD-10-CM | POA: Diagnosis not present

## 2017-06-29 DIAGNOSIS — I481 Persistent atrial fibrillation: Secondary | ICD-10-CM | POA: Diagnosis not present

## 2017-06-29 HISTORY — DX: Presence of cardiac pacemaker: Z95.0

## 2017-06-29 HISTORY — PX: BIV PACEMAKER INSERTION CRT-P: EP1199

## 2017-06-29 HISTORY — PX: AV NODE ABLATION: EP1193

## 2017-06-29 LAB — GLUCOSE, CAPILLARY
GLUCOSE-CAPILLARY: 135 mg/dL — AB (ref 65–99)
GLUCOSE-CAPILLARY: 135 mg/dL — AB (ref 65–99)
Glucose-Capillary: 101 mg/dL — ABNORMAL HIGH (ref 65–99)
Glucose-Capillary: 188 mg/dL — ABNORMAL HIGH (ref 65–99)

## 2017-06-29 LAB — SURGICAL PCR SCREEN
MRSA, PCR: NEGATIVE
Staphylococcus aureus: NEGATIVE

## 2017-06-29 SURGERY — AV NODE ABLATION

## 2017-06-29 MED ORDER — ONDANSETRON HCL 4 MG/2ML IJ SOLN: 4.0000 mg | Freq: Four times a day (QID) | INTRAMUSCULAR | Status: DC | PRN

## 2017-06-29 MED ORDER — POLYETHYLENE GLYCOL 3350 17 G PO PACK: 17.0000 g | PACK | Freq: Every day | ORAL | Status: DC | PRN

## 2017-06-29 MED ORDER — VANCOMYCIN HCL IN DEXTROSE 1-5 GM/200ML-% IV SOLN
INTRAVENOUS | Status: AC
  Filled 2017-06-29: qty 200

## 2017-06-29 MED ORDER — LIDOCAINE HCL (PF) 1 % IJ SOLN
INTRAMUSCULAR | Status: AC
  Filled 2017-06-29: qty 60

## 2017-06-29 MED ORDER — HEPARIN (PORCINE) IN NACL 2-0.9 UNIT/ML-% IJ SOLN
INTRAMUSCULAR | Status: AC | PRN
  Administered 2017-06-29 (×2): 500 mL

## 2017-06-29 MED ORDER — VANCOMYCIN HCL IN DEXTROSE 1-5 GM/200ML-% IV SOLN
1000.0000 mg | Freq: Once | INTRAVENOUS | Status: AC
  Administered 2017-06-30: 1000 mg via INTRAVENOUS
  Filled 2017-06-29: qty 200

## 2017-06-29 MED ORDER — VITAMIN D3 25 MCG (1000 UNIT) PO TABS
1000.0000 [IU] | ORAL_TABLET | Freq: Every day | ORAL | Status: DC
  Administered 2017-06-29 – 2017-06-30 (×2): 1000 [IU] via ORAL
  Filled 2017-06-29 (×4): qty 1

## 2017-06-29 MED ORDER — ALBUTEROL SULFATE (2.5 MG/3ML) 0.083% IN NEBU: 3.0000 mL | INHALATION_SOLUTION | Freq: Four times a day (QID) | RESPIRATORY_TRACT | Status: DC | PRN

## 2017-06-29 MED ORDER — CALCIUM CARBONATE 1250 (500 CA) MG PO TABS
1.0000 | ORAL_TABLET | Freq: Two times a day (BID) | ORAL | Status: DC
  Administered 2017-06-29 – 2017-06-30 (×2): 500 mg via ORAL
  Filled 2017-06-29 (×2): qty 1

## 2017-06-29 MED ORDER — PANTOPRAZOLE SODIUM 40 MG PO TBEC
40.0000 mg | DELAYED_RELEASE_TABLET | Freq: Two times a day (BID) | ORAL | Status: DC
  Administered 2017-06-29 – 2017-06-30 (×2): 40 mg via ORAL
  Filled 2017-06-29 (×2): qty 1

## 2017-06-29 MED ORDER — ALLOPURINOL 100 MG PO TABS
100.0000 mg | ORAL_TABLET | Freq: Two times a day (BID) | ORAL | Status: DC
  Administered 2017-06-29 – 2017-06-30 (×2): 100 mg via ORAL
  Filled 2017-06-29 (×2): qty 1

## 2017-06-29 MED ORDER — MIDAZOLAM HCL 5 MG/5ML IJ SOLN
INTRAMUSCULAR | Status: AC
  Filled 2017-06-29: qty 5

## 2017-06-29 MED ORDER — LEVOTHYROXINE SODIUM 125 MCG PO TABS
125.0000 ug | ORAL_TABLET | Freq: Every day | ORAL | Status: DC
  Administered 2017-06-30: 125 ug via ORAL
  Filled 2017-06-29 (×2): qty 1

## 2017-06-29 MED ORDER — VANCOMYCIN HCL IN DEXTROSE 1-5 GM/200ML-% IV SOLN
1000.0000 mg | INTRAVENOUS | Status: AC
  Administered 2017-06-29: 1000 mg via INTRAVENOUS

## 2017-06-29 MED ORDER — FERROUS SULFATE 325 (65 FE) MG PO TABS
325.0000 mg | ORAL_TABLET | Freq: Every day | ORAL | Status: DC
  Administered 2017-06-30: 325 mg via ORAL
  Filled 2017-06-29 (×2): qty 1

## 2017-06-29 MED ORDER — CHLORHEXIDINE GLUCONATE 4 % EX LIQD: 60.0000 mL | Freq: Once | CUTANEOUS | Status: DC

## 2017-06-29 MED ORDER — TEMAZEPAM 15 MG PO CAPS: 15.0000 mg | ORAL_CAPSULE | Freq: Every evening | ORAL | Status: DC | PRN

## 2017-06-29 MED ORDER — ACETAMINOPHEN 325 MG PO TABS
325.0000 mg | ORAL_TABLET | ORAL | Status: DC | PRN
Start: 2017-06-29 — End: 2017-06-30
  Administered 2017-06-29: 650 mg via ORAL
  Filled 2017-06-29: qty 2

## 2017-06-29 MED ORDER — IOPAMIDOL (ISOVUE-370) INJECTION 76%
INTRAVENOUS | Status: AC
  Filled 2017-06-29: qty 50

## 2017-06-29 MED ORDER — FUROSEMIDE 40 MG PO TABS
40.0000 mg | ORAL_TABLET | Freq: Every day | ORAL | Status: DC
  Administered 2017-06-30: 40 mg via ORAL
  Filled 2017-06-29: qty 1

## 2017-06-29 MED ORDER — ACETAMINOPHEN 500 MG PO TABS: 1000.0000 mg | ORAL_TABLET | Freq: Four times a day (QID) | ORAL | Status: DC | PRN

## 2017-06-29 MED ORDER — SODIUM CHLORIDE 0.9 % IR SOLN
80.0000 mg | Status: AC
  Administered 2017-06-29: 80 mg

## 2017-06-29 MED ORDER — MIDAZOLAM HCL 5 MG/5ML IJ SOLN
INTRAMUSCULAR | Status: DC | PRN
  Administered 2017-06-29 (×4): 1 mg via INTRAVENOUS

## 2017-06-29 MED ORDER — SODIUM CHLORIDE 0.9 % IR SOLN
Status: AC
  Filled 2017-06-29: qty 2

## 2017-06-29 MED ORDER — SODIUM CHLORIDE 0.9 % IV SOLN
INTRAVENOUS | Status: DC
  Administered 2017-06-29: 11:00:00 via INTRAVENOUS

## 2017-06-29 MED ORDER — FENTANYL CITRATE (PF) 100 MCG/2ML IJ SOLN
INTRAMUSCULAR | Status: AC
  Filled 2017-06-29: qty 2

## 2017-06-29 MED ORDER — LOSARTAN POTASSIUM 50 MG PO TABS
50.0000 mg | ORAL_TABLET | Freq: Every day | ORAL | Status: DC
  Administered 2017-06-29 – 2017-06-30 (×2): 50 mg via ORAL
  Filled 2017-06-29 (×2): qty 1

## 2017-06-29 MED ORDER — ISOSORBIDE MONONITRATE ER 30 MG PO TB24
15.0000 mg | ORAL_TABLET | Freq: Every day | ORAL | Status: DC
  Administered 2017-06-30: 15 mg via ORAL
  Filled 2017-06-29 (×3): qty 1

## 2017-06-29 MED ORDER — PRAVASTATIN SODIUM 10 MG PO TABS
10.0000 mg | ORAL_TABLET | Freq: Every day | ORAL | Status: DC
  Filled 2017-06-29 (×2): qty 1

## 2017-06-29 MED ORDER — MAGNESIUM OXIDE 400 (241.3 MG) MG PO TABS
200.0000 mg | ORAL_TABLET | Freq: Every day | ORAL | Status: DC
  Administered 2017-06-29 – 2017-06-30 (×2): 200 mg via ORAL
  Filled 2017-06-29: qty 0.5
  Filled 2017-06-29 (×2): qty 1
  Filled 2017-06-29: qty 0.5

## 2017-06-29 MED ORDER — LIDOCAINE HCL (PF) 1 % IJ SOLN
INTRAMUSCULAR | Status: DC | PRN
  Administered 2017-06-29: 20 mL
  Administered 2017-06-29: 50 mL

## 2017-06-29 MED ORDER — MUPIROCIN 2 % EX OINT
TOPICAL_OINTMENT | CUTANEOUS | Status: AC
  Administered 2017-06-29: 1 via NASAL
  Filled 2017-06-29: qty 22

## 2017-06-29 MED ORDER — LOPERAMIDE HCL 2 MG PO CAPS: 2.0000 mg | ORAL_CAPSULE | Freq: Four times a day (QID) | ORAL | Status: DC | PRN

## 2017-06-29 MED ORDER — HYDRALAZINE HCL 25 MG PO TABS
12.5000 mg | ORAL_TABLET | Freq: Three times a day (TID) | ORAL | Status: DC
  Administered 2017-06-29: 12.5 mg via ORAL
  Filled 2017-06-29 (×3): qty 1

## 2017-06-29 MED ORDER — METOPROLOL SUCCINATE ER 100 MG PO TB24
100.0000 mg | ORAL_TABLET | Freq: Every day | ORAL | Status: DC
  Administered 2017-06-29: 100 mg via ORAL
  Filled 2017-06-29 (×2): qty 1

## 2017-06-29 MED ORDER — GLIPIZIDE ER 5 MG PO TB24
5.0000 mg | ORAL_TABLET | Freq: Every day | ORAL | Status: DC
  Administered 2017-06-30: 5 mg via ORAL
  Filled 2017-06-29: qty 1

## 2017-06-29 MED ORDER — GABAPENTIN 100 MG PO CAPS
100.0000 mg | ORAL_CAPSULE | Freq: Three times a day (TID) | ORAL | Status: DC
  Administered 2017-06-29 – 2017-06-30 (×2): 100 mg via ORAL
  Filled 2017-06-29 (×2): qty 1

## 2017-06-29 MED ORDER — POTASSIUM CHLORIDE ER 10 MEQ PO TBCR
10.0000 meq | EXTENDED_RELEASE_TABLET | ORAL | Status: DC
  Administered 2017-06-30: 10 meq via ORAL
  Filled 2017-06-29 (×3): qty 1

## 2017-06-29 MED ORDER — IOPAMIDOL (ISOVUE-370) INJECTION 76%
INTRAVENOUS | Status: DC | PRN
  Administered 2017-06-29: 10 mL via INTRAVENOUS
  Administered 2017-06-29: 26 mL via INTRAVENOUS

## 2017-06-29 MED ORDER — FENTANYL CITRATE (PF) 100 MCG/2ML IJ SOLN
INTRAMUSCULAR | Status: DC | PRN
  Administered 2017-06-29: 12.5 ug via INTRAVENOUS
  Administered 2017-06-29: 25 ug via INTRAVENOUS
  Administered 2017-06-29: 12.5 ug via INTRAVENOUS

## 2017-06-29 MED ORDER — LIDOCAINE HCL (PF) 1 % IJ SOLN
INTRAMUSCULAR | Status: AC
  Filled 2017-06-29: qty 30

## 2017-06-29 MED ORDER — FUROSEMIDE 20 MG PO TABS
20.0000 mg | ORAL_TABLET | Freq: Every day | ORAL | Status: DC
  Administered 2017-06-29: 20 mg via ORAL
  Filled 2017-06-29: qty 1

## 2017-06-29 SURGICAL SUPPLY — 22 items
ALLURE CRT PM3262 (Pacemaker) ×3 IMPLANT
BALLN COR SINUS VENO 6F 80CM (BALLOONS) ×1
BALLN COR SINUS VENO 6FR 80 (BALLOONS) ×2
BALLOON COR SINUS VENO 6FR 80 (BALLOONS) ×1 IMPLANT
CABLE SURGICAL S-101-97-12 (CABLE) ×3 IMPLANT
CATH CELSIUS THERM D CV 7F (ABLATOR) ×3 IMPLANT
CATH CPS DIRECT 135 DS2C027 (CATHETERS) ×3 IMPLANT
CATH HEX JOS 2-5-2 65CM 6F REP (CATHETERS) ×6 IMPLANT
CPS IMPLANT KIT 410190 (MISCELLANEOUS) ×3 IMPLANT
LEAD QUARTET 1456Q-86 (Lead) ×1 IMPLANT
LEAD TENDRIL MRI 52CM LPA1200M (Lead) ×3 IMPLANT
PACEMAKER ALLURE CRT (Pacemaker) ×1 IMPLANT
PACK EP LATEX FREE (CUSTOM PROCEDURE TRAY) ×2
PACK EP LF (CUSTOM PROCEDURE TRAY) ×1 IMPLANT
PAD DEFIB LIFELINK (PAD) ×3 IMPLANT
QUARTET 1456Q-86 (Lead) ×3 IMPLANT
SHEATH CLASSIC 8F (SHEATH) ×6 IMPLANT
SHEATH PINNACLE 8F 10CM (SHEATH) ×3 IMPLANT
SHIELD RADPAD SCOOP 12X17 (MISCELLANEOUS) ×3 IMPLANT
SLITTER UNIVERSAL DS2A003 (MISCELLANEOUS) ×6 IMPLANT
TRAY PACEMAKER INSERTION (PACKS) ×3 IMPLANT
WIRE LUGE 182CM (WIRE) ×3 IMPLANT

## 2017-06-29 NOTE — Discharge Summary (Signed)
ELECTROPHYSIOLOGY PROCEDURE DISCHARGE SUMMARY    Patient ID: [name],  MRN: [id number], DOB/AGE: 62/04/1956 62 y.o.  Admit date: 06/29/2017 Discharge date: 06/30/2017  Primary Care Physician: Celene Squibb, MD Electrophysiologist: Lovena Le  Primary Discharge Diagnosis:  Uncontrolled persistent AF with RVR status post pacemaker implantation and AVN ablation this admission  Secondary Discharge Diagnosis:  1.  Amiodarone toxicity 2.  HTN 3.  Diabetes 4.  Chronic systolic heart failure  Allergies  Allergen Reactions  . Flecainide Nausea Only and Other (See Comments)    Faint feeling  . Hydrocodone-Acetaminophen Nausea Only and Other (See Comments)    Severe headache  . Ibuprofen Other (See Comments)    Kidney dysfunction  . Oxycodone Hcl Nausea Only and Other (See Comments)    Headache  . Penicillins Nausea Only and Other (See Comments)    Severe headache Has patient had a PCN reaction causing immediate rash, facial/tongue/throat swelling, SOB or lightheadedness with hypotension: No Has patient had a PCN reaction causing severe rash involving mucus membranes or skin necrosis: No Has patient had a PCN reaction that required hospitalization: No Has patient had a PCN reaction occurring within the last 10 years: No If all of the above answers are "NO", then may proceed with Cephalosporin use.      Procedures This Admission:  1.  Implantation of a STJ CRT PPM on 06/29/17 by Dr Lovena Le.  See op note for full details.  There were no immediate post procedure complications. 2.  AVN ablation on 06/29/17 by Dr Lovena Le. See op note for full details.  There were no immediate post procedure complications. 3.  CXR on 06/30/17 demonstrated no pneumothorax status post device implantation.   Brief HPI: [name] is a 62 y.o. female with persistent AF with RVR that has been refractory to medical therapy. She developed amiodarone toxicity as well. Risks, benefits, and alternatives to  PPM implantation and AVN ablation were reviewed with the patient who wished to proceed.   Hospital Course:  The patient was admitted and underwent implantation of a STJ CRTP and AVN ablation with details as outlined above.  She  was monitored on telemetry overnight which demonstrated a V paced rhythm with underlying Afib.  Left chest was without hematoma or ecchymosis.  The device was interrogated and found to be functioning normally.  CXR was obtained and demonstrated no pneumothorax status post device implantation.  Wound care, arm mobility, and restrictions were reviewed with the patient.  The patient was examined and considered stable for discharge to home.   Of note, blood pressures were soft this morning, in the 22'W systolic.  She was asymptomatic.  We have halved her home metoprolol dose.  She has been ambulating w/o difficulty.   Physical Exam: Vitals:   06/30/17 0900 06/30/17 1119 06/30/17 1151 06/30/17 1304  BP: 97/68 94/66 (!) 92/54 (!) 81/54  Pulse:  91 91   Resp: (!) 24 17  18   Temp:  99.7 F (37.6 C)    TempSrc:  Oral    SpO2:  96%    Weight:      Height:       Labs:   Lab Results  Component Value Date   WBC 7.4 06/30/2017   HGB 9.3 (L) 06/30/2017   HCT 31.2 (L) 06/30/2017   MCV 90.7 06/30/2017   PLT 258 06/30/2017    Recent Labs  Lab 06/30/17 1359  NA 135  K 3.7  CL 107  CO2 21*  BUN 45*  CREATININE 1.83*  CALCIUM 8.6*  GLUCOSE 135*    Discharge Medications:  Allergies as of 06/30/2017      Reactions   Flecainide Nausea Only, Other (See Comments)   Faint feeling   Hydrocodone-acetaminophen Nausea Only, Other (See Comments)   Severe headache   Ibuprofen Other (See Comments)   Kidney dysfunction   Oxycodone Hcl Nausea Only, Other (See Comments)   Headache   Penicillins Nausea Only, Other (See Comments)   Severe headache Has patient had a PCN reaction causing immediate rash, facial/tongue/throat swelling, SOB or lightheadedness with hypotension:  No Has patient had a PCN reaction causing severe rash involving mucus membranes or skin necrosis: No Has patient had a PCN reaction that required hospitalization: No Has patient had a PCN reaction occurring within the last 10 years: No If all of the above answers are "NO", then may proceed with Cephalosporin use.      Medication List    TAKE these medications   acetaminophen 500 MG tablet Commonly known as:  TYLENOL Take 1,000 mg by mouth every 6 (six) hours as needed (for pain.).   albuterol 108 (90 Base) MCG/ACT inhaler Commonly known as:  PROVENTIL HFA;VENTOLIN HFA Inhale 2 puffs into the lungs every 6 (six) hours as needed for wheezing or shortness of breath.   ALIGN PO Take 1 tablet by mouth daily.   allopurinol 100 MG tablet Commonly known as:  ZYLOPRIM Take 100 mg by mouth 2 (two) times daily.   calcium carbonate 600 MG Tabs tablet Commonly known as:  OS-CAL Take 600 mg by mouth 2 (two) times daily with a meal.   cholecalciferol 1000 units tablet Commonly known as:  VITAMIN D Take 1,000 Units by mouth daily.   cyclobenzaprine 5 MG tablet Commonly known as:  FLEXERIL Take 1-2 tabs by mouth up to three times daily for muscle spasms.   ferrous sulfate 325 (65 FE) MG tablet Take 325 mg by mouth daily with breakfast.   Fish Oil 1200 MG Caps Take 1,200 mg by mouth 2 (two) times daily.   furosemide 20 MG tablet Commonly known as:  LASIX Take 1 tablet (20 mg total) by mouth daily. What changed:    when to take this  reasons to take this   gabapentin 100 MG capsule Commonly known as:  NEURONTIN Take 100 mg by mouth 3 (three) times daily.   GLIPIZIDE XL 5 MG 24 hr tablet Generic drug:  glipiZIDE Take 5 mg by mouth daily before breakfast.   hydrALAZINE 25 MG tablet Commonly known as:  APRESOLINE Take 0.5 tablets (12.5 mg total) by mouth every 8 (eight) hours.   isosorbide mononitrate 30 MG 24 hr tablet Commonly known as:  IMDUR Take 0.5 tablets (15 mg  total) by mouth daily.   levothyroxine 125 MCG tablet Commonly known as:  SYNTHROID, LEVOTHROID Take 125 mcg by mouth daily before breakfast.   loperamide 2 MG capsule Commonly known as:  IMODIUM Take 2-4 mg by mouth 4 (four) times daily as needed for diarrhea or loose stools.   losartan 50 MG tablet Commonly known as:  COZAAR Take 50 mg by mouth daily.   Magnesium 250 MG Tabs Take 250 mg by mouth daily.   metoprolol succinate 50 MG 24 hr tablet Commonly known as:  TOPROL-XL Take 1 tablet (50 mg total) by mouth daily. What changed:    medication strength  how much to take   pantoprazole 40 MG tablet Commonly known as:  PROTONIX  Take 40 mg by mouth 2 (two) times daily.   polyethylene glycol packet Commonly known as:  MIRALAX / GLYCOLAX Take 17 g by mouth daily as needed (for constipation.).   potassium chloride 10 MEQ tablet Commonly known as:  K-DUR Take 1 tablet (10 mEq total) by mouth every other day. What changed:    when to take this  additional instructions   pravastatin 10 MG tablet Commonly known as:  PRAVACHOL Take 10 mg by mouth daily.   Rivaroxaban 15 MG Tabs tablet Commonly known as:  XARELTO Take 1 tablet (15 mg total) by mouth daily. **resume on Monday 1/7. What changed:  additional instructions   sitaGLIPtin 50 MG tablet Commonly known as:  JANUVIA Take 50 mg by mouth daily.   temazepam 15 MG capsule Commonly known as:  RESTORIL Take 15 mg by mouth at bedtime as needed for sleep.      Disposition:   Follow-up Information    [place] Office Follow up on 07/11/2017.   Specialty:  Cardiology Why:  at [facility] information: [address], [place] [address] [phone]          Duration of Discharge Encounter: Greater than 30 minutes including physician time.  Signed, Murray Hodgkins, NP 06/30/2017 3:09 PM

## 2017-06-29 NOTE — Progress Notes (Signed)
Site Area:  RFV Site prior to removal: Level  0 Presure applied for:  12 minutes Manual:  yes Pt status during pull:  asymptomatic Post pull site:  Level 0 Post pull instructions given:  yes Post pull pulses present:  yes Dressing applied:  Gauze/tegaderm Bedrest begins @:  14:50 Comments: Removed by Vear Clock RN

## 2017-06-29 NOTE — Interval H&P Note (Signed)
History and Physical Interval Note:  06/29/2017 11:55 AM  [name]  has presented today for surgery, with the diagnosis of afib  The various methods of treatment have been discussed with the patient and family. After consideration of risks, benefits and other options for treatment, the patient has consented to  AV node ablation and biv pacemaker insertion as a surgical intervention .  The patient's history has been reviewed, patient examined, no change in status, stable for surgery.  I have reviewed the patient's chart and labs.  Questions were answered to the patient's satisfaction.     Cristopher Peru

## 2017-06-29 NOTE — Discharge Instructions (Signed)
° ° °  Supplemental Discharge Instructions for  Pacemaker/Defibrillator Patients  Activity No heavy lifting or vigorous activity with your left/right arm for 6 to 8 weeks.  Do not raise your left/right arm above your head for one week.  Gradually raise your affected arm as drawn below.           __        07/03/17                       07/04/17                   07/05/17                07/06/17   NO DRIVING for  1 week   ; you may begin driving on  8/41/32   .  WOUND CARE - Keep the wound area clean and dry.  Do not get this area wet for one week. No showers for one week; you may shower on   07/06/17  . - The tape/steri-strips on your wound will fall off; do not pull them off.  No bandage is needed on the site.  DO  NOT apply any creams, oils, or ointments to the wound area. - If you notice any drainage or discharge from the wound, any swelling or bruising at the site, or you develop a fever > 101? F after you are discharged home, call the office at once.  Special Instructions - You are still able to use cellular telephones; use the ear opposite the side where you have your pacemaker/defibrillator.  Avoid carrying your cellular phone near your device. - When traveling through airports, show security personnel your identification card to avoid being screened in the metal detectors.  Ask the security personnel to use the hand wand. - Avoid arc welding equipment, MRI testing (magnetic resonance imaging), TENS units (transcutaneous nerve stimulators).  Call the office for questions about other devices. - Avoid electrical appliances that are in poor condition or are not properly grounded. - Microwave ovens are safe to be near or to operate.

## 2017-06-30 ENCOUNTER — Ambulatory Visit (HOSPITAL_COMMUNITY): Payer: PPO

## 2017-06-30 ENCOUNTER — Encounter (HOSPITAL_COMMUNITY): Payer: Self-pay | Admitting: Nurse Practitioner

## 2017-06-30 DIAGNOSIS — I110 Hypertensive heart disease with heart failure: Secondary | ICD-10-CM | POA: Diagnosis not present

## 2017-06-30 DIAGNOSIS — I442 Atrioventricular block, complete: Secondary | ICD-10-CM | POA: Diagnosis not present

## 2017-06-30 DIAGNOSIS — I481 Persistent atrial fibrillation: Secondary | ICD-10-CM | POA: Diagnosis not present

## 2017-06-30 DIAGNOSIS — I5022 Chronic systolic (congestive) heart failure: Secondary | ICD-10-CM | POA: Diagnosis not present

## 2017-06-30 DIAGNOSIS — I482 Chronic atrial fibrillation: Secondary | ICD-10-CM

## 2017-06-30 DIAGNOSIS — E1151 Type 2 diabetes mellitus with diabetic peripheral angiopathy without gangrene: Secondary | ICD-10-CM | POA: Diagnosis not present

## 2017-06-30 DIAGNOSIS — T462X1A Poisoning by other antidysrhythmic drugs, accidental (unintentional), initial encounter: Secondary | ICD-10-CM | POA: Diagnosis not present

## 2017-06-30 DIAGNOSIS — I429 Cardiomyopathy, unspecified: Secondary | ICD-10-CM | POA: Diagnosis not present

## 2017-06-30 DIAGNOSIS — J9811 Atelectasis: Secondary | ICD-10-CM | POA: Diagnosis not present

## 2017-06-30 DIAGNOSIS — Z79899 Other long term (current) drug therapy: Secondary | ICD-10-CM | POA: Diagnosis not present

## 2017-06-30 DIAGNOSIS — Z7984 Long term (current) use of oral hypoglycemic drugs: Secondary | ICD-10-CM | POA: Diagnosis not present

## 2017-06-30 DIAGNOSIS — Z7901 Long term (current) use of anticoagulants: Secondary | ICD-10-CM | POA: Diagnosis not present

## 2017-06-30 DIAGNOSIS — I471 Supraventricular tachycardia: Secondary | ICD-10-CM | POA: Diagnosis not present

## 2017-06-30 DIAGNOSIS — E039 Hypothyroidism, unspecified: Secondary | ICD-10-CM | POA: Diagnosis not present

## 2017-06-30 LAB — CBC
HEMATOCRIT: 31.2 % — AB (ref 36.0–46.0)
Hemoglobin: 9.3 g/dL — ABNORMAL LOW (ref 12.0–15.0)
MCH: 27 pg (ref 26.0–34.0)
MCHC: 29.8 g/dL — ABNORMAL LOW (ref 30.0–36.0)
MCV: 90.7 fL (ref 78.0–100.0)
Platelets: 258 10*3/uL (ref 150–400)
RBC: 3.44 MIL/uL — ABNORMAL LOW (ref 3.87–5.11)
RDW: 15.5 % (ref 11.5–15.5)
WBC: 7.4 10*3/uL (ref 4.0–10.5)

## 2017-06-30 LAB — GLUCOSE, CAPILLARY
Glucose-Capillary: 122 mg/dL — ABNORMAL HIGH (ref 65–99)
Glucose-Capillary: 142 mg/dL — ABNORMAL HIGH (ref 65–99)

## 2017-06-30 LAB — BASIC METABOLIC PANEL
ANION GAP: 7 (ref 5–15)
BUN: 45 mg/dL — ABNORMAL HIGH (ref 6–20)
CHLORIDE: 107 mmol/L (ref 101–111)
CO2: 21 mmol/L — ABNORMAL LOW (ref 22–32)
Calcium: 8.6 mg/dL — ABNORMAL LOW (ref 8.9–10.3)
Creatinine, Ser: 1.83 mg/dL — ABNORMAL HIGH (ref 0.44–1.00)
GFR calc Af Amer: 33 mL/min — ABNORMAL LOW (ref >60)
GFR, EST NON AFRICAN AMERICAN: 29 mL/min — AB (ref >60)
GLUCOSE: 135 mg/dL — AB (ref 65–99)
Potassium: 3.7 mmol/L (ref 3.5–5.1)
Sodium: 135 mmol/L (ref 135–145)

## 2017-06-30 MED ORDER — RIVAROXABAN 15 MG PO TABS: 15.0000 mg | ORAL_TABLET | Freq: Every day | ORAL | 6 refills | Status: DC

## 2017-06-30 MED ORDER — FUROSEMIDE 20 MG PO TABS: 20.0000 mg | ORAL_TABLET | Freq: Every day | ORAL | Status: DC

## 2017-06-30 MED ORDER — POTASSIUM CHLORIDE ER 10 MEQ PO TBCR: 10.0000 meq | EXTENDED_RELEASE_TABLET | ORAL | Status: AC

## 2017-06-30 MED ORDER — METOPROLOL SUCCINATE ER 50 MG PO TB24: 50.0000 mg | ORAL_TABLET | Freq: Every day | ORAL | 3 refills | Status: DC

## 2017-06-30 NOTE — Progress Notes (Signed)
Pt SBP is staying in the 80-90's. Asymptomatic. I have held Lopressor and hydralazine. Cardiology provider notified.  Probation officer

## 2017-06-30 NOTE — Progress Notes (Signed)
Progress Note  Patient Name: [name] Date of Encounter: 06/30/2017  Primary Cardiologist: Cristopher Peru, MD  Subjective   Feels well this afternoon.  Somewhat tired.  No significant pain @ pacer site.  Inpatient Medications    Scheduled Meds: . allopurinol  100 mg Oral BID  . calcium carbonate  1 tablet Oral BID WC  . cholecalciferol  1,000 Units Oral Daily  . ferrous sulfate  325 mg Oral Q breakfast  . furosemide  40 mg Oral Daily  . gabapentin  100 mg Oral TID  . glipiZIDE  5 mg Oral QAC breakfast  . hydrALAZINE  12.5 mg Oral Q8H  . isosorbide mononitrate  15 mg Oral Daily  . levothyroxine  125 mcg Oral QAC breakfast  . losartan  50 mg Oral Daily  . magnesium oxide  200 mg Oral Daily  . metoprolol succinate  100 mg Oral Daily  . pantoprazole  40 mg Oral BID  . potassium chloride  10 mEq Oral QODAY  . pravastatin  10 mg Oral q1800   Continuous Infusions:  PRN Meds: acetaminophen, albuterol, loperamide, ondansetron (ZOFRAN) IV, polyethylene glycol, temazepam   Vital Signs    Vitals:   06/30/17 0900 06/30/17 1119 06/30/17 1151 06/30/17 1304  BP: 97/68 94/66 (!) 92/54 (!) 81/54  Pulse:  91 91   Resp: (!) 24 17  18   Temp:  99.7 F (37.6 C)    TempSrc:  Oral    SpO2:  96%    Weight:      Height:        Intake/Output Summary (Last 24 hours) at 06/30/2017 1458 Last data filed at 06/30/2017 1200 Gross per 24 hour  Intake 1000 ml  Output -  Net 1000 ml   Filed Weights   06/29/17 0907  Weight: 145 lb (65.8 kg)    Physical Exam   GEN: Well nourished, well developed, in no acute distress.  HEENT: Grossly normal.  Neck: Supple, no JVD, carotid bruits, or masses. Cardiac: RRR, no murmurs, rubs, or gallops. No clubbing, cyanosis, edema.  Radials/DP/PT 2+ and equal bilaterally. L upper chest wall pacer site w/o bleeding/hematoma. R groin cath site w/o bleeding/bruit/hematoma. Respiratory:  Respirations regular and unlabored, clear to auscultation  bilaterally. GI: Soft, nontender, nondistended, BS + x 4. MS: no deformity or atrophy. Skin: warm and dry, no rash. Neuro:  Strength and sensation are intact. Psych: AAOx3.  Normal affect.  Labs    Cbc pending.   Radiology    Dg Chest 2 View  Result Date: 06/30/2017 CLINICAL DATA:  Status post pacer placement. EXAM: CHEST  2 VIEW COMPARISON:  Chest radiograph 05/16/2017. FINDINGS: Multi lead lead pacer apparatus overlies the left hemithorax. Stable cardiomegaly. Low lung volumes. Bibasilar atelectasis. Probable small bilateral pleural effusions. Thoracic spine degenerative changes. IMPRESSION: No acute cardiopulmonary process. Electronically Signed   By: Lovey Newcomer M.D.   On: 06/30/2017 10:26    Telemetry    V paced, underlying Afib - Personally Reviewed  ECG    V paced, 90, underlying Afib - Personally Reviewed  Patient Profile     62 y.o. female with a h/o uncontrolled Afib refractory to all medications except amio (resulted in lung toxicity), with subsequent development of tachy-induced CM w/ an EF of 25% (04/2017), nonobs dzs on cath (04/2017), who presented 1/4 for BiV PPM placement and AVN RFCA.  Assessment & Plan    1.  Permanent Atrial Fibrillation: difficult to control Afib, recalcitrant to all AAD  except amio, which was d/c'd in the setting of lung toxicity. Now s/p biV PPM and AVN catheter ablation. Feels well.  V paced.  CXR w/o PTX. Plan to resume Xarelto 1/7 PM.  With soft BP's and AVN ablation, will cut  blocker dose.  2.  Tachycardia induced cardiomyopathy: euvolemic. Cont  blocker, ARB, hydral/nitrate.  3.  Essential HTN:  On multiple meds @ home   blocker, ARB, hydral/nitrate. BPs soft today. Asymptomatic. Will reduce  blocker dose.  Signed, Murray Hodgkins, NP  06/30/2017, 2:58 PM    For questions or updates, please contact   Please consult [email or web address] for contact info under Cardiology/STEMI.    Seen and reviewed  Wound without  hematoma Instructions given

## 2017-07-02 ENCOUNTER — Encounter (HOSPITAL_COMMUNITY): Payer: Self-pay | Admitting: Internal Medicine

## 2017-07-06 ENCOUNTER — Encounter: Payer: Self-pay | Admitting: *Deleted

## 2017-07-06 ENCOUNTER — Other Ambulatory Visit: Payer: Self-pay | Admitting: *Deleted

## 2017-07-06 NOTE — Patient Outreach (Addendum)
[facility] [place]) Care Management  07/06/2017  [name] 04/01/1956 [id number]  [facility] Follow Up  Referral Date:06/05/17 Referral Source:TOC Calls Reason for Referral:Disease Management Education Insurance:Health Team Advantage   Outreach Attempt:  Successful telephone outreach to patient for follow up post hospital stay.  HIPAA confirmed.  Patient had AV node ablation and placement of Biventricular pacemaker placed 06/29/2017.  Discharged home on 06/30/2017.  Patient states she has done well since being home.  Was able to shower today and will be going out to pharmacy to pick up her medications today also.  Per discharge instructions patient able to start to drive today.  Denies any drainage from incision.  Patient able to verbalize arm limitations as prescribed at discharge.  Continues to weigh daily.  Weight this morning 146. Pounds.  Endorses some tenderness with incision.  Denies any chest pain or palpitations.  Patient encouraged to notify physician for any severe pain or drainage at incision, increasing shortness of breath, chest pain, or increasing lower extremity edema.  Appointments:  Patient verbalizes she has scheduled pacemaker appointment with Cardiology on 07/11/2017.  Plan:  RN Health Coach will make next monthly outreach to patient in the month of February.  [place] [phone] [name]

## 2017-07-09 ENCOUNTER — Other Ambulatory Visit: Payer: Self-pay | Admitting: Pharmacist

## 2017-07-09 NOTE — Patient Outreach (Signed)
[facility]) Care Management  [place]   07/09/2017  ANISHA STARLIPER 08-01-1955 [id number]   62 year old female referred to [place] Management for post-discharge transition of care.  She is currently receiving [place] assistance for disease management education.  [place] services requested for medication management and medication assistance with Januvia.  PMHx includes, but not limited to, uncontrolled persistent atrial fibrillation with RVR s/p recent AV node ablation and pacemaker implantation, non-ischemic cardiomyopathy, chronic systolic heart failure (EF 25% Nov '18), chronic kidney disease stage III, CAD,  diabetes, HTN, amiodarone toxicity, OSA, IDA, and ILD.   Note metoprolol dose reduced following AVN ablation / pacemaker placement.   Successful telephone call with [name] today. HIPAA identifiers verified. Ms. Abundis is agreeable to pharmacy services.   Subjective:   "I can't afford my Januvia." Patient reports she has been using samples from PCP however samples no longer available therefore patient has not taken Januvia for at least a month.  She reports she cannot afford the $45 copay through [employer].  She reports her monthly income is $877.  Patient reports she thinks someone through [employer] assisted her with applying for Extra Help LIS and that she received a letter in the mail stating she had been approved.  Patient reports she uses pill bottles with a bin system to organize her medications.  She is not interested in a pillbox or compliance packaging for her medications.    Objective:  Hemoglobin A1C = 6.7 (12/19/'18) SCr = 1.83 (1/5/'19), CrCl = 33 ml/min (using weight 65.8kg)  Encounter Medications: Outpatient Encounter Medications as of 07/09/2017  Medication Sig Note  . acetaminophen (TYLENOL) 500 MG tablet Take 1,000 mg by mouth every 6 (six) hours as needed (for pain.).    Marland Kitchen albuterol (PROVENTIL HFA;VENTOLIN HFA) 108 (90 Base) MCG/ACT  inhaler Inhale 2 puffs into the lungs every 6 (six) hours as needed for wheezing or shortness of breath.   . allopurinol (ZYLOPRIM) 100 MG tablet Take 100 mg by mouth 2 (two) times daily.    . calcium carbonate (OS-CAL) 600 MG TABS Take 600 mg by mouth 2 (two) times daily with a meal.     . cholecalciferol (VITAMIN D) 1000 UNITS tablet Take 1,000 Units by mouth daily.   . cyclobenzaprine (FLEXERIL) 5 MG tablet Take 1-2 tabs by mouth up to three times daily for muscle spasms.   . ferrous sulfate 325 (65 FE) MG tablet Take 325 mg by mouth daily with breakfast.   . furosemide (LASIX) 20 MG tablet Take 1 tablet (20 mg total) by mouth daily.   Marland Kitchen gabapentin (NEURONTIN) 100 MG capsule Take 100 mg by mouth 3 (three) times daily.    Marland Kitchen GLIPIZIDE XL 5 MG 24 hr tablet Take 5 mg by mouth daily before breakfast.   . hydrALAZINE (APRESOLINE) 25 MG tablet Take 0.5 tablets (12.5 mg total) by mouth every 8 (eight) hours.   . isosorbide mononitrate (IMDUR) 30 MG 24 hr tablet Take 0.5 tablets (15 mg total) by mouth daily.   Marland Kitchen levothyroxine (SYNTHROID, LEVOTHROID) 125 MCG tablet Take 125 mcg by mouth daily before breakfast.   . loperamide (IMODIUM) 2 MG capsule Take 2-4 mg by mouth 4 (four) times daily as needed for diarrhea or loose stools.   Marland Kitchen losartan (COZAAR) 50 MG tablet Take 50 mg by mouth daily. 06/27/2017: Patient reports not taking  . Magnesium 250 MG TABS Take 250 mg by mouth daily.    Marland Kitchen  metoprolol succinate (TOPROL-XL) 50 MG 24 hr tablet Take 1 tablet (50 mg total) by mouth daily.   . Omega-3 Fatty Acids (FISH OIL) 1200 MG CAPS Take 1,200 mg by mouth 2 (two) times daily.   . pantoprazole (PROTONIX) 40 MG tablet Take 40 mg by mouth 2 (two) times daily.    . polyethylene glycol (MIRALAX / GLYCOLAX) packet Take 17 g by mouth daily as needed (for constipation.).    Marland Kitchen potassium chloride (K-DUR) 10 MEQ tablet Take 1 tablet (10 mEq total) by mouth every other day.   . pravastatin (PRAVACHOL) 10 MG tablet Take 10 mg  by mouth daily.   . Probiotic Product (ALIGN PO) Take 1 tablet by mouth daily.    . Rivaroxaban (XARELTO) 15 MG TABS tablet Take 1 tablet (15 mg total) by mouth daily. **resume on Monday 1/7.   . sitaGLIPtin (JANUVIA) 50 MG tablet Take 50 mg by mouth daily. 06/27/2017: Have been trying to get samples from MD office for this medication, can not afford copay  . temazepam (RESTORIL) 15 MG capsule Take 15 mg by mouth at bedtime as needed for sleep.     No facility-administered encounter medications on file as of 07/09/2017.     Functional Status: In your present state of health, do you have any difficulty performing the following activities: 06/29/2017 06/27/2017  Hearing? N Y  Comment - total hearing loss to left ear, bilateral hearing aides  Vision? N Y  Comment - wears glasses  Difficulty concentrating or making decisions? N N  Walking or climbing stairs? Y Y  Comment - trouble climbing stairs due to shortness of breath  Dressing or bathing? N N  Doing errands, shopping? - Scientist, forensic and eating ? - N  Using the Toilet? - N  In the past six months, have you accidently leaked urine? - N  Do you have problems with loss of bowel control? - Y  Managing your Medications? - N  Managing your Finances? - N  Housekeeping or managing your Housekeeping? - N  Some recent data might be hidden    Fall/Depression Screening: Fall Risk  06/27/2017 05/21/2017  Falls in the past year? Yes Yes  Comment - back in june  Number falls in past yr: 1 1  Injury with Fall? No Yes  Risk for fall due to : History of fall(s);Medication side effect -  Follow up Education provided;Falls prevention discussed Falls prevention discussed   PHQ 2/9 Scores 06/27/2017 05/21/2017 12/05/2016  PHQ - 2 Score 1 0 0    Assessment: Date Discharged from Hospital: 06/30/2017 Date Medication Reconciliation Performed: 07/09/17  Patient was recently discharged from hospital and all medications have been reviewed.  Medications  Discontinued at Discharge:  None     New Medications at Discharge:   None  Changes made to the following medications:  Furosemide  Metoprolol  Potassium  Rivaroxaban  Drugs sorted by system:  Neurologic/Psychologic: gabapentin, temazepam  Cardiovascular: Fish oil, furosemide, hydralazine, isosorbide mononitrate, losartan, metoprolol succinate, pravastatin, rivaroxaban  Pulmonary/Allergy: albuterol  Gastrointestinal: align, ferrous sulfate, loperamide, polyethylene glycol, pantoprazole  Endocrine: glipizide, sitagliptin, levothyroxine  Renal: none  Topical: none  Pain: Acetaminophen, cyclobenzaprine  Vitamins/Minerals: calcium carbonate, cholecalciferol, magnesium, potassium   Infectious Diseases: none  Miscellaneous: allopurinol  Renal dosing: No issues (Rivaroxaban, sitagliptin renally adjusted) Gaps in therapy: No issues Duplications in therapy: No issues Medications to avoid in the elderly: No issues Drug interactions: No issues Other issues noted: Pantoprazole: Usual dose is  40mg  once daily for GERD.  Patient currently taking 40mg  BID.  Consider trial at lower dose as clinically warranted.   Medication assistance: Care coordination call placed to patient's pharmacy, [facility], with patient.  Patient verified as having LIS.  She does not have active prescription on file for Januvia though as last filled in 2016.  Pharmacy faxed Dr. Juel Burrow office for a refill.   Care coordination call placed to Dr. Nevada Crane as well to request a new prescription for Januvia and to recommend 90 day prescriptions be sent to pharmacy as patient may pay the same co-pay for 30 and 90 day supply.    Medications reviewed with patient.  We discussed how to monitor for bleeding with Rivaroxaban.  All questions answered.  Patient does not have any other medication questions or concerns at this time.    Plan:  I will follow-up with pharmacy tomorrow to ensure prescription sent  from Dr. Nevada Crane.   Ralene Bathe, PharmD, [place] [phone]

## 2017-07-10 ENCOUNTER — Other Ambulatory Visit: Payer: Self-pay | Admitting: Pharmacist

## 2017-07-10 ENCOUNTER — Ambulatory Visit: Payer: Self-pay | Admitting: Pharmacist

## 2017-07-10 NOTE — Patient Outreach (Signed)
[facility]) Care Management  07/10/2017  CLYDE UPSHAW 09-27-1955 [id number]  Care coordination call placed to [facility].  No prescription for Januvia received yet for patient.   Care coordination call placed to Dr. Shepard [facility] office.  2nd voicemail left requesting prescription for Januvia be sent to [facility].   Plan: I will follow-up tomorrow regarding Januvia for [name].   Ralene Bathe, PharmD, [place] [phone]

## 2017-07-11 ENCOUNTER — Ambulatory Visit (INDEPENDENT_AMBULATORY_CARE_PROVIDER_SITE_OTHER): Payer: PPO | Admitting: *Deleted

## 2017-07-11 ENCOUNTER — Ambulatory Visit: Payer: Self-pay | Admitting: Pharmacist

## 2017-07-11 ENCOUNTER — Ambulatory Visit: Payer: PPO

## 2017-07-11 ENCOUNTER — Other Ambulatory Visit: Payer: Self-pay | Admitting: Pharmacist

## 2017-07-11 DIAGNOSIS — I428 Other cardiomyopathies: Secondary | ICD-10-CM

## 2017-07-11 NOTE — Patient Outreach (Addendum)
[facility]) Care Management  07/11/2017  [name] December 12, 1955 [id number]  Communication received from Dr. Juel Burrow office that sitagliptin prescription sent to [facility].   Unsuccessful call placed to [name] to update her on prescription.  HIPAA compliant voicemails left on home and mobile phones.   Call placed to [facility].  Prescription received.  Patient has already picked up this medication.  Cost = $3.80 under LIS / Extra Help copay.   Plan: I will call patient to ensure no further medication questions or concerns.  If none, [facility] pharmacy will sign-off.   Ralene Bathe, PharmD, [place] [phone]

## 2017-07-12 ENCOUNTER — Telehealth: Payer: Self-pay | Admitting: Pharmacist

## 2017-07-12 LAB — CUP PACEART INCLINIC DEVICE CHECK
Battery Remaining Longevity: 43 mo
Brady Statistic RA Percent Paced: 0 %
Date Time Interrogation Session: 20190116194520
Implantable Lead Location: 753858
Implantable Lead Location: 753860
Lead Channel Impedance Value: 1050 Ohm
Lead Channel Impedance Value: 512.5 Ohm
Lead Channel Pacing Threshold Amplitude: 0.75 V
Lead Channel Pacing Threshold Amplitude: 1 V
Lead Channel Pacing Threshold Pulse Width: 0.5 ms
Lead Channel Pacing Threshold Pulse Width: 0.5 ms
Lead Channel Setting Pacing Amplitude: 3.5 V
Lead Channel Setting Pacing Amplitude: 5 V
Lead Channel Setting Pacing Pulse Width: 0.5 ms
MDC IDC LEAD IMPLANT DT: 20190104
MDC IDC LEAD IMPLANT DT: 20190104
MDC IDC MSMT BATTERY VOLTAGE: 2.99 V
MDC IDC MSMT LEADCHNL LV PACING THRESHOLD AMPLITUDE: 1 V
MDC IDC MSMT LEADCHNL RV PACING THRESHOLD AMPLITUDE: 0.75 V
MDC IDC MSMT LEADCHNL RV PACING THRESHOLD PULSEWIDTH: 0.5 ms
MDC IDC MSMT LEADCHNL RV PACING THRESHOLD PULSEWIDTH: 0.5 ms
MDC IDC MSMT LEADCHNL RV SENSING INTR AMPL: 9.5 mV
MDC IDC PG IMPLANT DT: 20190104
MDC IDC SET LEADCHNL RV PACING PULSEWIDTH: 0.5 ms
MDC IDC SET LEADCHNL RV SENSING SENSITIVITY: 4 mV
MDC IDC STAT BRADY RV PERCENT PACED: 98 %
Pulse Gen Model: 3262
Pulse Gen Serial Number: 8931822

## 2017-07-12 NOTE — Patient Outreach (Signed)
[place] [facility]) Care Management  07/12/2017  [name] 12-19-1955 [id number]   Successful call to [name] today. HIPAA identifiers verified. [name] reports she picked up the Januvia and has been taking it daily.  She has no further medication questions or concerns.  She has my number in case she needs to follow-up with me in the future.    [facility] pharmacy will close patient case at this time.  I am happy to assist in the future as needed.  I will alert [place] of pharmacy case closure.   Ralene Bathe, PharmD, [place] [phone]

## 2017-07-12 NOTE — Progress Notes (Signed)
Wound check appointment. Steri-strips removed. Wound without redness or edema. Incision edges approximated, wound well healed. Normal device function. Thresholds and impedances consistent with implant measurements. Device programmed at 5V (RV) and 3.5V (LV)  for extra safety margin until 3 month visit. Histogram distribution appropriate for AVN ablation. No high ventricular rates noted. Base rate remains at 90bpm, no changes made this session per device notes. Patient educated about wound care, arm mobility, lifting restrictions. Will discuss future follow up with GT.

## 2017-07-19 ENCOUNTER — Ambulatory Visit: Payer: PPO | Admitting: Gastroenterology

## 2017-07-31 ENCOUNTER — Other Ambulatory Visit: Payer: Self-pay | Admitting: Gastroenterology

## 2017-08-02 DIAGNOSIS — R600 Localized edema: Secondary | ICD-10-CM | POA: Diagnosis not present

## 2017-08-02 DIAGNOSIS — Z6833 Body mass index (BMI) 33.0-33.9, adult: Secondary | ICD-10-CM | POA: Diagnosis not present

## 2017-08-03 ENCOUNTER — Other Ambulatory Visit: Payer: Self-pay | Admitting: Internal Medicine

## 2017-08-09 ENCOUNTER — Other Ambulatory Visit: Payer: Self-pay | Admitting: *Deleted

## 2017-08-09 DIAGNOSIS — R600 Localized edema: Secondary | ICD-10-CM | POA: Diagnosis not present

## 2017-08-09 NOTE — Patient Outreach (Signed)
[facility] [facility]) Care Management  08/09/2017  [name] 04-17-1956 [id number]   RN Health Coach Monthly Outreach  Referral Date:06/05/17 Referral Source:TOC Calls Reason for Referral:Disease Management Education Insurance:Health Team Advantage   Outreach Attempt:  Outreach attempt #1 to patient for monthly follow up. No answer. RN Health Coach left HIPAA compliant voicemail message along with contact information.  Plan:  RN Health Coach will make another outreach attempt to patient within one business day.   [name] [phone] [name]

## 2017-08-10 ENCOUNTER — Encounter: Payer: Self-pay | Admitting: *Deleted

## 2017-08-10 ENCOUNTER — Other Ambulatory Visit: Payer: Self-pay | Admitting: *Deleted

## 2017-08-10 NOTE — Patient Outreach (Signed)
[facility] [facility]) Care Management  08/10/2017  [name] 04/06/1956 [id number]  RN Health CoachMonthly Outreach  Referral Date:06/05/17 Referral Source:TOC Calls Reason for Referral:Disease Management Education Insurance:Health Team Advantage   Outreach Attempt:  Successful telephone outreach to patient for monthly follow up.  Patient reports having increased lower extremity edema with lower extremity pain and increased weight gain up to 159 pounds last week.  States she went to see her primary care and was instructed to increase the dose of her lasix for 5 days.  Patient states the swelling has resolved in he legs and the pain is gone.  States her weight is back down to 145 pounds this morning and she has already had follow up with her primary care physician this week.  Verbalizes she weighs herself daily.  Reviewed with patient signs and symptoms of heart failure and the importance of daily weight monitoring and knowing when to call the physician for guidance with weight gain.  Reviewed with patient heart failure zones and when to call the physician versus when to go to the emergency room.  Denies any chest pains or palpitations.  States her pacemaker incision is healing fine and swelling in site is reduced.  Verbalizes she has her [place] prescription, but still needs her physician to give 90 day supply scripts to her pharmacy.  Encouraged patient to request 90 day supply prescriptions from her medical doctor.  Reports daily monitoring of her blood sugars and this mornings fasting blood sugar was 121.  Appointments:  Patient saw Dr. Nevada Crane on 08/02/2017 and has had follow up appointment this week on 08/08/2017 to check lower extremity edema.  States she has pacemaker wound check appointment on 08/13/2017.  Patient encouraged to discuss heart failure exacerbation with increasing lower extremity edema at the upcoming appointment to see if Cardiology would like to see her  sooner than the scheduled April 25th scheduled appointment.  Patient stated she would do so.  Plan: Patient to review the heart failure zones educational information. RN Health Coach will make next monthly telephone outreach to patient in the month of March.  [place] [phone] [email or web address]

## 2017-08-15 DIAGNOSIS — I10 Essential (primary) hypertension: Secondary | ICD-10-CM | POA: Diagnosis not present

## 2017-08-15 DIAGNOSIS — E782 Mixed hyperlipidemia: Secondary | ICD-10-CM | POA: Diagnosis not present

## 2017-08-15 DIAGNOSIS — E039 Hypothyroidism, unspecified: Secondary | ICD-10-CM | POA: Diagnosis not present

## 2017-08-15 DIAGNOSIS — E1122 Type 2 diabetes mellitus with diabetic chronic kidney disease: Secondary | ICD-10-CM | POA: Diagnosis not present

## 2017-08-28 ENCOUNTER — Ambulatory Visit (INDEPENDENT_AMBULATORY_CARE_PROVIDER_SITE_OTHER): Payer: Self-pay | Admitting: *Deleted

## 2017-08-28 DIAGNOSIS — I428 Other cardiomyopathies: Secondary | ICD-10-CM

## 2017-08-28 LAB — CUP PACEART INCLINIC DEVICE CHECK
Battery Voltage: 2.96 V
Brady Statistic RA Percent Paced: 0 %
Brady Statistic RV Percent Paced: 99.23 %
Date Time Interrogation Session: 20190305172257
Implantable Lead Implant Date: 20190104
Implantable Lead Location: 753858
Implantable Lead Location: 753860
Lead Channel Impedance Value: 487.5 Ohm
Lead Channel Setting Pacing Amplitude: 5 V
Lead Channel Setting Pacing Pulse Width: 0.5 ms
Lead Channel Setting Pacing Pulse Width: 0.5 ms
MDC IDC LEAD IMPLANT DT: 20190104
MDC IDC MSMT BATTERY REMAINING LONGEVITY: 40 mo
MDC IDC MSMT LEADCHNL LV IMPEDANCE VALUE: 1025 Ohm
MDC IDC MSMT LEADCHNL RV SENSING INTR AMPL: 11.8 mV
MDC IDC PG IMPLANT DT: 20190104
MDC IDC SET LEADCHNL LV PACING AMPLITUDE: 3.5 V
MDC IDC SET LEADCHNL RV SENSING SENSITIVITY: 4 mV
Pulse Gen Model: 3262
Pulse Gen Serial Number: 8931822

## 2017-08-28 NOTE — Progress Notes (Signed)
Per GT decreased base rate to 80 bpm from 90 bpm s/p AVN ablation. Per GT follow-up 91 day s/p CRTP implant & AVN ablation, ROV 10/18/17.

## 2017-09-03 ENCOUNTER — Other Ambulatory Visit: Payer: Self-pay | Admitting: *Deleted

## 2017-09-03 NOTE — Patient Outreach (Signed)
[facility] Care Management  09/03/2017  [name] 06-26-1956 [id number]   RN Health CoachMonthly Outreach  Referral Date:06/05/17 Referral Source:TOC Calls Reason for Referral:Disease Management Education Insurance:Health Team Advantage   Outreach Attempt:  Outreach attempt #1 to patient for monthly follow up. No answer. RN Health Coach left HIPAA compliant voicemail message along with contact information.  Plan:  RN Health Coach will make another outreach attempt to patient within one business day if no return call back from patient.  [place] [phone] [email or web address]

## 2017-09-04 ENCOUNTER — Other Ambulatory Visit: Payer: Self-pay | Admitting: *Deleted

## 2017-09-04 ENCOUNTER — Encounter: Payer: Self-pay | Admitting: *Deleted

## 2017-09-04 NOTE — Patient Outreach (Signed)
[place] [facility]) Care Management  09/04/2017  LABRENDA LASKY 1955-07-28 [id number]   RN Health CoachMonthly Outreach  Referral Date:06/05/17 Referral Source:TOC Calls Reason for Referral:Disease Management Education Insurance:Health Team Advantage   Outreach Attempt:  Successful telephone outreach to patient for monthly follow up.  HIPAA verified with patient.  Patient reporting increase in muscle spasm pain in back and side after fall last month.  States she tripped over uneven area on her porch.  Encouraged patient to contact primary care provider and request to be seen after recent fall and increase in pain. Falls precautions and prevention reviewed with patient.  Reports compliance with her fluid medication and states she is less short or breath after her pacemaker was recently reprogrammed.  Does reports some ankle edema but feels this is related to tight socks and states when she removes the socks at night the swelling resolves.  Continues to weigh daily.  Weight this morning was 142 pounds and patient states her weight has ranged below 145 pounds for the last two weeks.  Denies any chest pain at this time.  Monitors blood sugars daily.  CBG this morning was 92 with typical ranges of 110-120's.  States she has her daily medications at this time.  Appointments:  Reports seeing Dr. Nevada Crane on 08/08/2017 and has scheduled follow up appointment in April 2019.  Patient encouraged to contact Dr. Juel Burrow office to request sooner appointment with increase in pain after fall.  Has scheduled Cardiology appointment on 10/18/2017.  Plan: RN Health Coach will make next monthly outreach to patient in the month of April. RN Health Coach will send patient EMMI on Falls Prevention and Getting Up from Fall.   [place] [phone] [email or web address]

## 2017-09-18 DIAGNOSIS — E1121 Type 2 diabetes mellitus with diabetic nephropathy: Secondary | ICD-10-CM | POA: Diagnosis not present

## 2017-09-18 DIAGNOSIS — I481 Persistent atrial fibrillation: Secondary | ICD-10-CM | POA: Diagnosis not present

## 2017-09-18 DIAGNOSIS — Z6830 Body mass index (BMI) 30.0-30.9, adult: Secondary | ICD-10-CM | POA: Diagnosis not present

## 2017-09-18 DIAGNOSIS — K210 Gastro-esophageal reflux disease with esophagitis: Secondary | ICD-10-CM | POA: Diagnosis not present

## 2017-09-18 DIAGNOSIS — I5042 Chronic combined systolic (congestive) and diastolic (congestive) heart failure: Secondary | ICD-10-CM | POA: Diagnosis not present

## 2017-09-18 DIAGNOSIS — E039 Hypothyroidism, unspecified: Secondary | ICD-10-CM | POA: Diagnosis not present

## 2017-09-18 DIAGNOSIS — D509 Iron deficiency anemia, unspecified: Secondary | ICD-10-CM | POA: Diagnosis not present

## 2017-09-18 DIAGNOSIS — E1122 Type 2 diabetes mellitus with diabetic chronic kidney disease: Secondary | ICD-10-CM | POA: Diagnosis not present

## 2017-09-18 DIAGNOSIS — J849 Interstitial pulmonary disease, unspecified: Secondary | ICD-10-CM | POA: Diagnosis not present

## 2017-09-18 DIAGNOSIS — E559 Vitamin D deficiency, unspecified: Secondary | ICD-10-CM | POA: Diagnosis not present

## 2017-09-18 DIAGNOSIS — E782 Mixed hyperlipidemia: Secondary | ICD-10-CM | POA: Diagnosis not present

## 2017-09-18 DIAGNOSIS — N184 Chronic kidney disease, stage 4 (severe): Secondary | ICD-10-CM | POA: Diagnosis not present

## 2017-10-02 DIAGNOSIS — E1122 Type 2 diabetes mellitus with diabetic chronic kidney disease: Secondary | ICD-10-CM | POA: Diagnosis not present

## 2017-10-02 DIAGNOSIS — M5430 Sciatica, unspecified side: Secondary | ICD-10-CM | POA: Diagnosis not present

## 2017-10-02 DIAGNOSIS — E1121 Type 2 diabetes mellitus with diabetic nephropathy: Secondary | ICD-10-CM | POA: Diagnosis not present

## 2017-10-02 DIAGNOSIS — R0602 Shortness of breath: Secondary | ICD-10-CM | POA: Diagnosis not present

## 2017-10-02 DIAGNOSIS — Z6831 Body mass index (BMI) 31.0-31.9, adult: Secondary | ICD-10-CM | POA: Diagnosis not present

## 2017-10-02 DIAGNOSIS — M545 Low back pain: Secondary | ICD-10-CM | POA: Diagnosis not present

## 2017-10-02 DIAGNOSIS — E039 Hypothyroidism, unspecified: Secondary | ICD-10-CM | POA: Diagnosis not present

## 2017-10-02 DIAGNOSIS — I5042 Chronic combined systolic (congestive) and diastolic (congestive) heart failure: Secondary | ICD-10-CM | POA: Diagnosis not present

## 2017-10-02 DIAGNOSIS — Z6833 Body mass index (BMI) 33.0-33.9, adult: Secondary | ICD-10-CM | POA: Diagnosis not present

## 2017-10-02 DIAGNOSIS — N184 Chronic kidney disease, stage 4 (severe): Secondary | ICD-10-CM | POA: Diagnosis not present

## 2017-10-02 DIAGNOSIS — Z6830 Body mass index (BMI) 30.0-30.9, adult: Secondary | ICD-10-CM | POA: Diagnosis not present

## 2017-10-02 DIAGNOSIS — R600 Localized edema: Secondary | ICD-10-CM | POA: Diagnosis not present

## 2017-10-02 DIAGNOSIS — E782 Mixed hyperlipidemia: Secondary | ICD-10-CM | POA: Diagnosis not present

## 2017-10-02 DIAGNOSIS — I10 Essential (primary) hypertension: Secondary | ICD-10-CM | POA: Diagnosis not present

## 2017-10-03 ENCOUNTER — Other Ambulatory Visit: Payer: Self-pay | Admitting: Nurse Practitioner

## 2017-10-04 NOTE — Telephone Encounter (Signed)
Need more information about Flexeril. I don't believe we are managing this?

## 2017-10-08 ENCOUNTER — Other Ambulatory Visit: Payer: Self-pay | Admitting: *Deleted

## 2017-10-08 ENCOUNTER — Encounter: Payer: Self-pay | Admitting: *Deleted

## 2017-10-08 ENCOUNTER — Telehealth: Payer: Self-pay

## 2017-10-08 NOTE — Telephone Encounter (Signed)
LMOM to call.

## 2017-10-08 NOTE — Telephone Encounter (Signed)
I spoke to pt about her medicine and sent note to Neil Crouch, PA.

## 2017-10-08 NOTE — Patient Outreach (Signed)
[facility]) Care Management  [place]  10/08/2017   [name] [birth year] [id number]   RN Health Coach Monthly Outreach   Referral Date:  06/05/17 Referral Source:  [facility] Calls Reason for Referral:  Disease Management Education Insurance:   [employer]   Outreach Attempt:   Received telephone call back from patient.  HIPAA verified with patient. Patient reports she is doing well.  Fells she has a runny nose and throat irritation from pollen.  Patient reports many medication changes after primary care appointment a few weeks ago.  Continues to weigh herself daily.  Weight this morning was 143 pounds with ranges below 145 pounds over the last few weeks.  Denies any shortness of breath or lower extremity edema.  Reports her lastest Hgb A1C was 5.8, with physician adjusting her medications.  Reports this mornings CBG was 116 with ranges 110-120's.  Denies any hypoglycemic events.  Encounter Medications:  Outpatient Encounter Medications as of 10/08/2017  Medication Sig Note  . acetaminophen (TYLENOL) 500 MG tablet Take 1,000 mg by mouth every 6 (six) hours as needed (for pain.).    Marland Kitchen albuterol (PROVENTIL HFA;VENTOLIN HFA) 108 (90 Base) MCG/ACT inhaler Inhale 2 puffs into the lungs every 6 (six) hours as needed for wheezing or shortness of breath.   . calcium carbonate (OS-CAL) 600 MG TABS Take 600 mg by mouth 2 (two) times daily with a meal.     . cholecalciferol (VITAMIN D) 1000 UNITS tablet Take 1,000 Units by mouth daily.   . cyclobenzaprine (FLEXERIL) 5 MG tablet Take 1-2 tabs by mouth up to three times daily for muscle spasms.   . cyclobenzaprine (FLEXERIL) 5 MG tablet TAKE 1 TO 2 TABLETS BY MOUTH UP TO 3 TIMES A DAY AS NEEDED FOR MUSCLESPASMS.   . ferrous sulfate 325 (65 FE) MG tablet Take 325 mg by mouth daily with breakfast.   . furosemide (LASIX) 20 MG tablet TAKE ONE TABLET BY MOUTH DAILY AS NEEDED.   Marland Kitchen gabapentin (NEURONTIN) 100 MG capsule  Take 100 mg by mouth 3 (three) times daily.    . hydrALAZINE (APRESOLINE) 25 MG tablet Take 0.5 tablets (12.5 mg total) by mouth every 8 (eight) hours.   . isosorbide mononitrate (IMDUR) 30 MG 24 hr tablet Take 0.5 tablets (15 mg total) by mouth daily.   Marland Kitchen levothyroxine (SYNTHROID, LEVOTHROID) 125 MCG tablet Take 125 mcg by mouth daily before breakfast.   . loperamide (IMODIUM) 2 MG capsule Take 2-4 mg by mouth 4 (four) times daily as needed for diarrhea or loose stools.   . Magnesium 250 MG TABS Take 250 mg by mouth daily.    . methocarbamol (ROBAXIN) 750 MG tablet Take 750 mg by mouth 3 (three) times daily.   . metoprolol succinate (TOPROL-XL) 50 MG 24 hr tablet Take 1 tablet (50 mg total) by mouth daily.   . pantoprazole (PROTONIX) 40 MG tablet Take 40 mg by mouth 2 (two) times daily.    . polyethylene glycol (MIRALAX / GLYCOLAX) packet Take 17 g by mouth daily as needed (for constipation.).    Marland Kitchen potassium chloride (K-DUR) 10 MEQ tablet Take 1 tablet (10 mEq total) by mouth every other day.   . pravastatin (PRAVACHOL) 10 MG tablet Take 10 mg by mouth daily.   . Probiotic Product (ALIGN PO) Take 1 tablet by mouth daily.    . Rivaroxaban (XARELTO) 15 MG TABS tablet Take 1 tablet (15 mg total) by mouth daily. **resume on  Monday 1/7.   . sitaGLIPtin (JANUVIA) 50 MG tablet Take 50 mg by mouth daily. 06/27/2017: Have been trying to get samples from MD office for this medication, can not afford copay  . temazepam (RESTORIL) 15 MG capsule Take 15 mg by mouth at bedtime as needed for sleep.    Marland Kitchen allopurinol (ZYLOPRIM) 100 MG tablet Take 100 mg by mouth 2 (two) times daily.  10/08/2017: Patient reports PCP stopped  . GLIPIZIDE XL 5 MG 24 hr tablet Take 5 mg by mouth daily before breakfast. 10/08/2017: Patient reports PCP stopped this medication  . losartan (COZAAR) 50 MG tablet Take 50 mg by mouth daily. 10/08/2017: Patient reports PCP stopped this medication  . Omega-3 Fatty Acids (FISH OIL) 1200 MG CAPS  Take 1,200 mg by mouth 2 (two) times daily. 10/08/2017: Patient reports PCP stopped this medication   No facility-administered encounter medications on file as of 10/08/2017.     Functional Status:  In your present state of health, do you have any difficulty performing the following activities: 06/29/2017 06/27/2017  Hearing? N Y  Comment - total hearing loss to left ear, bilateral hearing aides  Vision? N Y  Comment - wears glasses  Difficulty concentrating or making decisions? N N  Walking or climbing stairs? Y Y  Comment - trouble climbing stairs due to shortness of breath  Dressing or bathing? N N  Doing errands, shopping? - Scientist, forensic and eating ? - N  Using the Toilet? - N  In the past six months, have you accidently leaked urine? - N  Do you have problems with loss of bowel control? - Y  Managing your Medications? - N  Managing your Finances? - N  Housekeeping or managing your Housekeeping? - N  Some recent data might be hidden    Fall/Depression Screening: Fall Risk  10/08/2017 09/04/2017 06/27/2017  Falls in the past year? Yes Yes Yes  Comment no falls over the past month - -  Number falls in past yr: (No Data) 2 or more 1  Comment no falls over the last month 3 -  Injury with Fall? - No No  Risk for fall due to : - Impaired balance/gait;Medication side effect;History of fall(s) History of fall(s);Medication side effect  Follow up Education provided;Falls prevention discussed Falls evaluation completed;Education provided;Falls prevention discussed Education provided;Falls prevention discussed   PHQ 2/9 Scores 06/27/2017 05/21/2017 12/05/2016  PHQ - 2 Score 1 0 0    THN CM Care Plan Problem One     Most Recent Value  Care Plan Problem One  Knowledge deficiet related to self cae management of congestive heart failure  Role Documenting the Problem One  [place] for Problem One  Active  THN Long Term Goal   Patient will report no emergency room visits or  hospitalizations in the next 90 days.  THN Long Term Goal Start Date  10/08/17  THN Long Term Goal Met Date  10/08/17  Interventions for Problem One Long Term Goal  Current care plan and goals revieiwed with patient, encouraged patient to keep and attend schedueled medical appointments, falls precautions reviewed and discussed, reviewed medications and medication changes and encouraged medication compliance, discussed low sodium diet, congratulated patient on current A1C, discussed continued daily monitoring of weight and when to call the physician with weight over 145 pounds  THN CM Short Term Goal #1   Patient will verbalize 3 signs and symtoms in the CHF red zone.  THN  CM Short Term Goal #1 Start Date  10/08/17  [facility] CM Short Term Goal #1 Met Date  10/08/17  Interventions for Short Term Goal #1  encouraged patient to continue to review the Living better with heart failure booklet, reviewed heart failure zones with patient, reviewed signs and symtoms in the red zone with patient, reviewed signs and symptoms of heart failure exacerbation, discussed when to call the physician versus go to the emergency room  [facility] CM Short Term Goal #2   Patient will verbalize 4 foods she has reduced in her diet in the next 30 days.  THN CM Short Term Goal #2 Start Date  10/08/17  [facility] CM Short Term Goal #2 Met Date  10/08/17  Interventions for Short Term Goal #2  Discussed healthier meal optons with patient, discussed foods high in sodium and trying to avoid these foods, encouraged patient to monitor the amount of water she is drinking, discussed and encouraged patient to use low sodium foods  THN CM Short Term Goal #3  Patient will report no falls in the next 30 days  THN CM Short Term Goal #3 Start Date  09/04/17  [facility] CM Short Term Goal #3 Met Date  10/08/17      Appointments:  Patient reports seeing primary care provider, Dr. Nevada Crane the end of March 2019 and has scheduled follow up appointment with him in July 2019.   Schedule follow up appointment with Cardiologist on 10/18/2017.  Plan:   RN Health Coach will make next monthly outreach to patient in the month of May. RN Health Coach will send primary MD Quarterly Update.  [place] [phone] [email or web address]

## 2017-10-08 NOTE — Patient Outreach (Signed)
[facility]) Care Management  10/08/2017  CEOLA PARA [birth year] [id number]   RN Health CoachMonthly Outreach  Referral Date:06/05/17 Referral Source:TOC Calls Reason for Referral:Disease Management Education Insurance:Health Team Advantage   Outreach Attempt:  Outreach attempt #1 to patient for monthly follow up. No answer. RN Health Coach left HIPAA compliant voicemail message along with contact information.  Plan:  RN Health Coach will send unsuccessful outreach letter to patient.  RN Health Coach will make another outreach attempt to patient within 3-4 business days if no return call back from patient.  [place] [phone] [email or web address]

## 2017-10-08 NOTE — Telephone Encounter (Signed)
Pt said she was returning DS call from this morning and would try back later.

## 2017-10-08 NOTE — Telephone Encounter (Signed)
PT said Neil Crouch, PA has been giving it to her.

## 2017-10-10 NOTE — Telephone Encounter (Signed)
I gave her this back in 2017 for muscle spasms/back and she has periodically had refills since this.   I see that her medication list now also has Robaxin (which is also a muscle relaxer).   Would not advise her being on two muscle relaxers.

## 2017-10-11 ENCOUNTER — Ambulatory Visit: Payer: Self-pay | Admitting: *Deleted

## 2017-10-11 NOTE — Telephone Encounter (Signed)
I called pt to explain. She said she tool Robaxin for awhile, but it did not help that much and also it upset her stomach. So she no longer takes it.    I called Ovid Curd at [facility] and he said the last time she had it was from Dr. [place] Crane on 06/21/2017 #30.  Magda Paganini, please advise!

## 2017-10-15 NOTE — Telephone Encounter (Signed)
[name], please advise!

## 2017-10-16 MED ORDER — CYCLOBENZAPRINE HCL 5 MG PO TABS: ORAL_TABLET | ORAL | 0 refills | Status: DC

## 2017-10-16 NOTE — Telephone Encounter (Signed)
PT is aware.

## 2017-10-16 NOTE — Addendum Note (Signed)
Addended by: Mahala Menghini on: 10/16/2017 08:13 AM   Modules accepted: Orders

## 2017-10-16 NOTE — Telephone Encounter (Addendum)
Please let patient know that I have refilled Flexeril for one last time. SHE WILL HAVE TO GET FURTHER REFILLS FROM HER PCP. We don't manage back/muscle issues. Thanks.

## 2017-10-16 NOTE — Telephone Encounter (Signed)
LMOM for a return call.  

## 2017-10-18 ENCOUNTER — Ambulatory Visit (INDEPENDENT_AMBULATORY_CARE_PROVIDER_SITE_OTHER): Payer: PPO | Admitting: Internal Medicine

## 2017-10-18 ENCOUNTER — Encounter: Payer: Self-pay | Admitting: Internal Medicine

## 2017-10-18 VITALS — BP 118/82 | HR 78 | Ht 58.5 in | Wt 141.0 lb

## 2017-10-18 DIAGNOSIS — I4819 Other persistent atrial fibrillation: Secondary | ICD-10-CM

## 2017-10-18 DIAGNOSIS — I481 Persistent atrial fibrillation: Secondary | ICD-10-CM

## 2017-10-18 NOTE — Progress Notes (Addendum)
HPI [name] returns today for followup. She is a pleasant 62 yo woman with a h/o atrial fib, amio lung, acute systolic heart failure, and HTN. She notes some dypsnea with exertion. No anginal symptoms. No edema.  Allergies  Allergen Reactions  . Flecainide Nausea Only and Other (See Comments)    Faint feeling  . Hydrocodone-Acetaminophen Nausea Only and Other (See Comments)    Severe headache  . Ibuprofen Other (See Comments)    Kidney dysfunction  . Oxycodone Hcl Nausea Only and Other (See Comments)    Headache  . Penicillins Nausea Only and Other (See Comments)    Severe headache Has patient had a PCN reaction causing immediate rash, facial/tongue/throat swelling, SOB or lightheadedness with hypotension: No Has patient had a PCN reaction causing severe rash involving mucus membranes or skin necrosis: No Has patient had a PCN reaction that required hospitalization: No Has patient had a PCN reaction occurring within the last 10 years: No If all of the above answers are "NO", then may proceed with Cephalosporin use.      Current Outpatient Medications  Medication Sig Dispense Refill  . acetaminophen (TYLENOL) 500 MG tablet Take 1,000 mg by mouth every 6 (six) hours as needed (for pain.).     Marland Kitchen albuterol (PROVENTIL HFA;VENTOLIN HFA) 108 (90 Base) MCG/ACT inhaler Inhale 2 puffs into the lungs every 6 (six) hours as needed for wheezing or shortness of breath. 1 Inhaler 3  . allopurinol (ZYLOPRIM) 100 MG tablet Take 100 mg by mouth 2 (two) times daily.     . calcium carbonate (OS-CAL) 600 MG TABS Take 600 mg by mouth 2 (two) times daily with a meal.      . cholecalciferol (VITAMIN D) 1000 UNITS tablet Take 1,000 Units by mouth daily.    . cyclobenzaprine (FLEXERIL) 5 MG tablet Take 1-2 tabs by mouth up to three times daily for muscle spasms. 30 tablet 0  . cyclobenzaprine (FLEXERIL) 5 MG tablet TAKE 1 TO 2 TABLETS BY MOUTH UP TO 3 TIMES A DAY AS NEEDED FOR MUSCLESPASMS. 30  tablet 0  . ferrous sulfate 325 (65 FE) MG tablet Take 325 mg by mouth daily with breakfast.    . furosemide (LASIX) 20 MG tablet TAKE ONE TABLET BY MOUTH DAILY AS NEEDED. 30 tablet 1  . gabapentin (NEURONTIN) 100 MG capsule Take 100 mg by mouth 3 (three) times daily.     Marland Kitchen GLIPIZIDE XL 5 MG 24 hr tablet Take 5 mg by mouth daily before breakfast.    . hydrALAZINE (APRESOLINE) 25 MG tablet Take 0.5 tablets (12.5 mg total) by mouth every 8 (eight) hours. 45 tablet 6  . isosorbide mononitrate (IMDUR) 30 MG 24 hr tablet Take 0.5 tablets (15 mg total) by mouth daily. 30 tablet 3  . levothyroxine (SYNTHROID, LEVOTHROID) 125 MCG tablet Take 125 mcg by mouth daily before breakfast.    . loperamide (IMODIUM) 2 MG capsule Take 2-4 mg by mouth 4 (four) times daily as needed for diarrhea or loose stools.    Marland Kitchen losartan (COZAAR) 50 MG tablet Take 50 mg by mouth daily.    . Magnesium 250 MG TABS Take 250 mg by mouth daily.     . metoprolol succinate (TOPROL-XL) 50 MG 24 hr tablet Take 1 tablet (50 mg total) by mouth daily. 30 tablet 3  . Omega-3 Fatty Acids (FISH OIL) 1200 MG CAPS Take 1,200 mg by mouth 2 (two) times daily.    . pantoprazole (  PROTONIX) 40 MG tablet Take 40 mg by mouth 2 (two) times daily.     . polyethylene glycol (MIRALAX / GLYCOLAX) packet Take 17 g by mouth daily as needed (for constipation.).     Marland Kitchen potassium chloride (K-DUR) 10 MEQ tablet Take 1 tablet (10 mEq total) by mouth every other day.    . pravastatin (PRAVACHOL) 10 MG tablet Take 10 mg by mouth daily.    . Probiotic Product (ALIGN PO) Take 1 tablet by mouth daily.     . Rivaroxaban (XARELTO) 15 MG TABS tablet Take 1 tablet (15 mg total) by mouth daily. **resume on Monday 1/7. 30 tablet 6  . sitaGLIPtin (JANUVIA) 50 MG tablet Take 50 mg by mouth daily.    . temazepam (RESTORIL) 15 MG capsule Take 15 mg by mouth at bedtime as needed for sleep.      No current facility-administered medications for this visit.      Past Medical  History:  Diagnosis Date  . Amiodarone toxicity   . Anemia   . Chronic bronchitis   . Chronic systolic CHF (congestive heart failure) ([place])    a. 04/2017 Echo: EF 25%.  . Congenital heart disease   . Diabetes mellitus without complication ([place])    Type II  . Diverticulosis   . Fall due to stumbling October 08, 2013   Due to shoes  . GERD (gastroesophageal reflux disease)   . Gout   . Hemorrhoids   . Hyperlipidemia   . Hyperparathyroidism   . Hypertension   . Hypothyroidism   . IDA (iron deficiency anemia)   . Microscopic colitis 9/11   Colonoscopy  . NICM (nonischemic cardiomyopathy) - tachycardia induced in the setting of AFib ([place])    a. 04/2017 Echo: EF 25%, basal-mid inferolateral, inf AK, triv AI, mild MR, sev dil LA, mod red RV fxn, mildly dil RA, mild to mod TR, PASP 27mmHg.  . Non-obstructive CAD (coronary artery disease)    a. 04/2017 Cath: LM 20p, LCX nl, RCA 20p/m.  Marland Kitchen Paroxysmal SVT (supraventricular tachycardia) ([place])   . Persistent atrial fibrillation ([place]) 06/2013   a. 04/2017 Admit with CHF/NICM/AFib -> CHA2DS2VASc = 4-->Xarelto 15 mg daily started; b. 06/2017 s/p BiV PPM (SJM ser # [id number]) and AVN ablation.  . Presence of permanent cardiac pacemaker 06/29/2017   Biventricular pacemaker placed  . PVD (peripheral vascular disease) ([place]) 09/2004   Right common femoral endarterectomy in April of 2007  . Renal insufficiency   . Schatzki's ring    Last EGD with esophageal dilatation 22F  9/11  . Scoliosis   . Varicose veins right leg pain and swelling    ROS:   All systems reviewed and negative except as noted in the HPI.   Past Surgical History:  Procedure Laterality Date  . ASD REPAIR  Age 53   [facility]  . AV NODE ABLATION N/A 06/29/2017   Procedure: AV NODE ABLATION;  Surgeon: Evans Lance, MD;  Location: [place] CV LAB;  Service: Cardiovascular;  Laterality: N/A;  . BIOPSY  02/13/2017   Procedure: BIOPSY;   Surgeon: Daneil Dolin, MD;  Location: [facility] SU[facility];  Service: Endoscopy;;  duodenum gastric  . BIV PACEMAKER INSERTION CRT-P N/A 06/29/2017   Procedure: BIV PACEMAKER INSERTION CRT-P;  Surgeon: Evans Lance, MD;  Location: [place] CV LAB;  Service: Cardiovascular;  Laterality: N/A;  . CARDIOVERSION N/A 11/13/2013   Procedure: CARDIOVERSION;  Surgeon: Herminio Commons, MD;  Location: [facility] ORS;  Service: Endoscopy;  Laterality: N/A;  . COLONOSCOPY  03/04/2010   anal canal hemorrhoids otherwise normal. TI normal. Bx showed lymphocytic colitis. next TCS 02/2020  . COLONOSCOPY  April 2008   [place]: Pancolonic diverticulosis, external hemorrhoids  . COLONOSCOPY N/A 11/30/2015   RMR: Normal terminal ileum for 10 cm, nonbleeding grade 1 internal hemorrhoids, colonic diverticulosis. Next colonoscopy in 2027.  Marland Kitchen ELAS  06-01-11   Right saphenous ELAS   . ESOPHAGEAL DILATION N/A 05/28/2015   Procedure: ESOPHAGEAL DILATION;  Surgeon: Daneil Dolin, MD;  Location: [facility];  Service: Endoscopy;  Laterality: N/A;  . ESOPHAGOGASTRODUODENOSCOPY  03/04/2010   noncritical appearing Schatzki ring. SB bx negative  . ESOPHAGOGASTRODUODENOSCOPY  05/2008   erosive reflux esophagatitis, noncritical Schatzki ring  . ESOPHAGOGASTRODUODENOSCOPY N/A 05/28/2015   NFA:OZHYQMVH esophagus somewhat baggy, likely due to underlying esophageal motility disorder/small HH   . ESOPHAGOGASTRODUODENOSCOPY N/A 02/13/2017   Dr. Gala Romney: subtly abnormal stomach of doubtful clinical significant s/p biopsy, normal duodenum s/p biopsy, fundic gland polyp on path, normal duodenal biopsy  . [place]  2010  . Left hemithyroidectomy    . Left parathyroidectomy  2007  . Parathyroid adenoma  2007  . Right common femoral endarterectomy  2007  . RIGHT/LEFT HEART CATH AND CORONARY ANGIOGRAPHY N/A 05/15/2017   Procedure: RIGHT/LEFT HEART CATH AND CORONARY ANGIOGRAPHY;  Surgeon: Troy Sine, MD;  Location: [place] CV LAB;   Service: Cardiovascular;  Laterality: N/A;  . Small bowel capsule  12 2009   Mid to distal small bowel with edema, erosions, tiny ulceration felt to be NSAID related  . TONSILLECTOMY       Family History  Problem Relation Age of Onset  . Stroke Mother   . Heart disease Mother        CHF  - Amputation-Right Leg  . Hyperlipidemia Mother   . Hypertension Mother   . Congestive Heart Failure Mother   . Lung cancer Father   . Cancer Father        Lung  . Other Brother        back problems  . Heart attack Paternal Grandmother   . Cancer Maternal Grandmother        uterine     Social History   Socioeconomic History  . Marital status: Single    Spouse name: Not on file  . Number of children: 0  . Years of education: Not on file  . Highest education level: Not on file  Occupational History  . Occupation: Sales promotion account executive  Social Needs  . Financial resource strain: Not on file  . Food insecurity:    Worry: Not on file    Inability: Not on file  . Transportation needs:    Medical: Not on file    Non-medical: Not on file  Tobacco Use  . Smoking status: Never Smoker  . Smokeless tobacco: Never Used  . Tobacco comment: Never smoked  Substance and Sexual Activity  . Alcohol use: No    Alcohol/week: 0.0 oz  . Drug use: No  . Sexual activity: Never    Birth control/protection: Post-menopausal  Lifestyle  . Physical activity:    Days per week: Not on file    Minutes per session: Not on file  . Stress: Not on file  Relationships  . Social connections:    Talks on phone: Not on file    Gets together: Not on file    Attends religious service: Not on  file    Active member of club or organization: Not on file    Attends meetings of clubs or organizations: Not on file    Relationship status: Not on file  . Intimate partner violence:    Fear of current or ex partner: Not on file    Emotionally abused: Not on file    Physically abused: Not on file    Forced sexual activity:  Not on file  Other Topics Concern  . Not on file  Social History Narrative  . Not on file     BP 118/82   Pulse 78   Ht 4' 10.5" (1.486 m)   Wt 141 lb (64 kg)   SpO2 99%   BMI 28.97 kg/m   Physical Exam:  Well appearing 62 yo woman, NAD HEENT: Unremarkable Neck:  6 cm JVD, no thyromegally Lymphatics:  No adenopathy Back:  No CVA tenderness Lungs:  Clear with no wheezes HEART:  Regular rate rhythm, no murmurs, no rubs, no clicks Abd:  soft, positive bowel sounds, no organomegally, no rebound, no guarding Ext:  2 plus pulses, no edema, no cyanosis, no clubbing Skin:  No rashes no nodules Neuro:  CN II through XII intact, motor grossly intact  EKG - atrial fib with a controlled vr and biv pacing  DEVICE  Normal device function.  See PaceArt for details.   Assess/Plan: 1. Atrial fib - her ventricular rate is well controlled after av node ablation. 2. Chronic systolic heart failure- her symptoms are well controlled. She will continue her current meds. 3. HTN - her blood pressure is well controlled.  4. BiV PPM - her [employer] Jude BiV PPM is working normally. wil recheck in several months.  Mikle Bosworth.D.

## 2017-10-18 NOTE — Patient Instructions (Signed)
Medication Instructions:  Your physician recommends that you continue on your current medications as directed. Please refer to the Current Medication list given to you today.   Labwork: NONE   Testing/Procedures: NONE   Follow-Up: Your physician wants you to follow-up in: 9 Months with Dr. Lovena Le. You will receive a reminder letter in the mail two months in advance. If you don't receive a letter, please call our office to schedule the follow-up appointment.   Any Other Special Instructions Will Be Listed Below (If Applicable).     If you need a refill on your cardiac medications before your next appointment, please call your pharmacy. Thank you for choosing [facility]!

## 2017-10-18 NOTE — Progress Notes (Signed)
118/82 

## 2017-10-29 ENCOUNTER — Other Ambulatory Visit: Payer: Self-pay | Admitting: Internal Medicine

## 2017-11-06 ENCOUNTER — Other Ambulatory Visit: Payer: Self-pay | Admitting: Nurse Practitioner

## 2017-11-07 ENCOUNTER — Other Ambulatory Visit: Payer: Self-pay | Admitting: Gastroenterology

## 2017-11-09 ENCOUNTER — Encounter: Payer: Self-pay | Admitting: *Deleted

## 2017-11-09 ENCOUNTER — Other Ambulatory Visit: Payer: Self-pay | Admitting: *Deleted

## 2017-11-09 DIAGNOSIS — M5430 Sciatica, unspecified side: Secondary | ICD-10-CM | POA: Diagnosis not present

## 2017-11-09 DIAGNOSIS — N390 Urinary tract infection, site not specified: Secondary | ICD-10-CM | POA: Diagnosis not present

## 2017-11-09 DIAGNOSIS — M545 Low back pain: Secondary | ICD-10-CM | POA: Diagnosis not present

## 2017-11-09 DIAGNOSIS — Z6830 Body mass index (BMI) 30.0-30.9, adult: Secondary | ICD-10-CM | POA: Diagnosis not present

## 2017-11-09 DIAGNOSIS — E039 Hypothyroidism, unspecified: Secondary | ICD-10-CM | POA: Diagnosis not present

## 2017-11-09 DIAGNOSIS — R300 Dysuria: Secondary | ICD-10-CM | POA: Diagnosis not present

## 2017-11-09 DIAGNOSIS — Z6831 Body mass index (BMI) 31.0-31.9, adult: Secondary | ICD-10-CM | POA: Diagnosis not present

## 2017-11-09 DIAGNOSIS — Z6833 Body mass index (BMI) 33.0-33.9, adult: Secondary | ICD-10-CM | POA: Diagnosis not present

## 2017-11-09 DIAGNOSIS — E782 Mixed hyperlipidemia: Secondary | ICD-10-CM | POA: Diagnosis not present

## 2017-11-09 DIAGNOSIS — N184 Chronic kidney disease, stage 4 (severe): Secondary | ICD-10-CM | POA: Diagnosis not present

## 2017-11-09 DIAGNOSIS — R600 Localized edema: Secondary | ICD-10-CM | POA: Diagnosis not present

## 2017-11-09 DIAGNOSIS — E1122 Type 2 diabetes mellitus with diabetic chronic kidney disease: Secondary | ICD-10-CM | POA: Diagnosis not present

## 2017-11-09 NOTE — Patient Outreach (Signed)
[facility]) Care Management  11/09/2017  [name] 05-04-1956 [id number]   RN Health CoachMonthly Outreach  Referral Date:06/05/17 Referral Source:TOC Calls Reason for Referral:Disease Management Education Insurance:Health Team Advantage   Outreach Attempt:  Successful telephone outreach to patient for monthly follow up.  HIPAA verified with patient.  Patient reports she has been having hematuria and went to primary care's office today.  States she is being treated for urinary tract infection with antibiotics.  Patient stating she has been drinking a lot of water to help flush out her kidneys.  Instructed and encouraged to monitor fluid intake with urination amounts and daily weights.  States she continues to weigh herself daily.  This mornings weight was 144 pounds, which is her normal range.  Denies any shortness of breath (except with exertion-usual) and denies and lower extremity edema.  Reports yesterday's fasting blood sugar was 129 with fasting ranges of 100-130's.  Appointments:  Patient reports seeing her primary care provider today for urinary tract infection.  States she is scheduled to see him again in July 2019, but will follow up sooner depending on her hematuria and urinary tract infection.  Reports seeing Dr. Lovena Le, EP on 10/18/2017 and follows up again in 9 months.  Encouraged patient to contact Dr. Tanna Furry office to see if she needed to follow with another Cardiologist to manage her heart failure.  Patient stated she would call his office.  Reminded patient of upcoming Pulmonologist appointment on 12/12/2017.  Plan: RN Health Coach will make next monthly outreach to patient in the month of June.  [place] [phone] [email or web address]

## 2017-12-05 ENCOUNTER — Encounter: Payer: Self-pay | Admitting: *Deleted

## 2017-12-05 ENCOUNTER — Other Ambulatory Visit: Payer: Self-pay | Admitting: *Deleted

## 2017-12-05 NOTE — Patient Outreach (Signed)
[facility]) Care Management  12/05/2017  PAMULA LUTHER 1955/10/24 [id number]   RN Health CoachMonthly Outreach  Referral Date:06/05/17 Referral Source:TOC Calls Reason for Referral:Disease Management Education Insurance:[employer] Team Advantage   Outreach Attempt:  Successful telephone outreach to patient for monthly follow up.  HIPAA verified with patient.  Patient reporting initially resolving of urinary tract infection and hematuria, but now reports small amount of hematuria starting yesterday and some burning.  Instructed patient to contact primary care physician for appointment or recommendations.  Reports some increase in shortness of breath on last week, thinks it was due to the weather and humidity.  States shortness of breath is better now.  Denies and swelling in lower extremities.  Last weight was 142 pounds on Monday.  Reinforced education and purpose of weighing daily and when to call the physician.  Last fasting blood sugar was 119.  Denies any episodes of hypo or hyperglycemia.  Appointments:  Reports scheduled follow up appointment with her primary care provider is 01/18/2018 and Pulmonology is 12/12/2017.  Patient encouraged to contact Cardiology office to schedule follow up appointment and verify which Cardiologist she needs to follow for her heart failure diagnosis.  Plan:  RN Health Coach will make next monthly outreach to patient in the month of July.   [place] [phone] [email or web address]

## 2017-12-12 ENCOUNTER — Ambulatory Visit (INDEPENDENT_AMBULATORY_CARE_PROVIDER_SITE_OTHER): Payer: PPO | Admitting: Adult Health

## 2017-12-12 ENCOUNTER — Encounter: Payer: Self-pay | Admitting: Adult Health

## 2017-12-12 DIAGNOSIS — I428 Other cardiomyopathies: Secondary | ICD-10-CM

## 2017-12-12 DIAGNOSIS — J849 Interstitial pulmonary disease, unspecified: Secondary | ICD-10-CM

## 2017-12-12 NOTE — Progress Notes (Signed)
@Patient  ID: [name], female    DOB: 1956/05/22, 62 y.o.   MRN: [id number]  Chief Complaint  Patient presents with  . Follow-up    ILD     Referring provider: Celene Squibb, MD  HPI: 62 year old female never smoker seen 11/21/2016 for cough and shortness of breath felt to have a component of amiodarone lung toxicity  PMH significant for Chronic A Fib 2015 s/p DCCV placed on amiodarone , CHF /tachycardia mediated cardiomyopathy  Mild OSA w/ AHI 7/hr , CPAP intolerant  Echo June 2018, EF 93%,XT0 diastolic dysfunction,severely dilated left atrium moderately dilated. Right atrium, pulmonary pressure 52 mmHg.  Marland Kitchen 04/2017 Echo showed EF at 25%. Cath showed nonobstructive CAD . RHC showed mild elevated PAP   PFT 04/2017 shows stable lung function . FEV1 67% , ratio 85, FVC 60%. + BD response, DLCO 56%. (PFT 11/2016 FVC 64%, DLCO 61%)   December 2018 high-res CT chest showed nodular groundglass attenuation and other nodularity seen on previous exam had resolved mild groundglass attenuation noted,  No compelling findings to indicate interstitial lung disease.  Cardiomegaly with evidence of mild interstitial pulmonary edema.  Small right pleural effusion.   12/12/2017 Follow up : Amiodarone pulmonary toxicity  Patient presents for a six-month follow-up.  Patient has been followed for suspected amiodarone pulmonary toxicity.  Previous CT chest in May 2018 suggested interstitial lung disease.  Amiodarone was stopped.  Follow-up CT chest in December 2018 showed resolution of nodular groundglass attenuation and other nodularity.  She did have some mild interstitial pulmonary edema felt to be secondary to her congestive heart failure..  Patient says since last visit her breathing has improved.  She is not as short of breath.  And she has not had any significant cough. She is followed by cardiology for congestive heart failure.  Says that her extremity edema has decreased.  She underwent an  AV node ablation and pacemaker implantation.  Allergies  Allergen Reactions  . Flecainide Nausea Only and Other (See Comments)    Faint feeling  . Hydrocodone-Acetaminophen Nausea Only and Other (See Comments)    Severe headache  . Ibuprofen Other (See Comments)    Kidney dysfunction  . Oxycodone Hcl Nausea Only and Other (See Comments)    Headache  . Penicillins Nausea Only and Other (See Comments)    Severe headache Has patient had a PCN reaction causing immediate rash, facial/tongue/throat swelling, SOB or lightheadedness with hypotension: No Has patient had a PCN reaction causing severe rash involving mucus membranes or skin necrosis: No Has patient had a PCN reaction that required hospitalization: No Has patient had a PCN reaction occurring within the last 10 years: No If all of the above answers are "NO", then may proceed with Cephalosporin use.     Immunization History  Administered Date(s) Administered  . Influenza Split 03/25/2016, 03/25/2017  . Influenza-Unspecified 03/07/2014  . Pneumococcal Conjugate-13 04/26/2015  . Zoster 05/25/2016    Past Medical History:  Diagnosis Date  . Amiodarone toxicity   . Anemia   . Chronic bronchitis   . Chronic systolic CHF (congestive heart failure) ([place])    a. 04/2017 Echo: EF 25%.  . Congenital heart disease   . Diabetes mellitus without complication ([place])    Type II  . Diverticulosis   . Fall due to stumbling October 08, 2013   Due to shoes  . GERD (gastroesophageal reflux disease)   . Gout   . Hemorrhoids   . Hyperlipidemia   .  Hyperparathyroidism   . Hypertension   . Hypothyroidism   . IDA (iron deficiency anemia)   . Microscopic colitis 9/11   Colonoscopy  . NICM (nonischemic cardiomyopathy) - tachycardia induced in the setting of AFib ([place])    a. 04/2017 Echo: EF 25%, basal-mid inferolateral, inf AK, triv AI, mild MR, sev dil LA, mod red RV fxn, mildly dil RA, mild to mod TR, PASP 48mmHg.  . Non-obstructive CAD  (coronary artery disease)    a. 04/2017 Cath: LM 20p, LCX nl, RCA 20p/m.  Marland Kitchen Paroxysmal SVT (supraventricular tachycardia) ([place])   . Persistent atrial fibrillation ([place]) 06/2013   a. 04/2017 Admit with CHF/NICM/AFib -> CHA2DS2VASc = 4-->Xarelto 15 mg daily started; b. 06/2017 s/p BiV PPM (SJM ser # [id number]) and AVN ablation.  . Presence of permanent cardiac pacemaker 06/29/2017   Biventricular pacemaker placed  . PVD (peripheral vascular disease) ([place]) 09/2004   Right common femoral endarterectomy in April of 2007  . Renal insufficiency   . Schatzki's ring    Last EGD with esophageal dilatation 93F  9/11  . Scoliosis   . Varicose veins right leg pain and swelling    Tobacco History: Social History   Tobacco Use  Smoking Status Never Smoker  Smokeless Tobacco Never Used  Tobacco Comment   Never smoked   Counseling given: Not Answered Comment: Never smoked   Outpatient Encounter Medications as of 12/12/2017  Medication Sig  . acetaminophen (TYLENOL) 500 MG tablet Take 1,000 mg by mouth every 6 (six) hours as needed (for pain.).   Marland Kitchen albuterol (PROVENTIL HFA;VENTOLIN HFA) 108 (90 Base) MCG/ACT inhaler Inhale 2 puffs into the lungs every 6 (six) hours as needed for wheezing or shortness of breath.  . calcium carbonate (OS-CAL) 600 MG TABS Take 600 mg by mouth 2 (two) times daily with a meal.    . cholecalciferol (VITAMIN D) 1000 UNITS tablet Take 1,000 Units by mouth daily.  . cyclobenzaprine (FLEXERIL) 5 MG tablet Take 1-2 tabs by mouth up to three times daily for muscle spasms.  . ferrous sulfate 325 (65 FE) MG tablet Take 325 mg by mouth daily with breakfast.  . furosemide (LASIX) 20 MG tablet TAKE ONE TABLET BY MOUTH DAILY AS NEEDED.  Marland Kitchen gabapentin (NEURONTIN) 100 MG capsule Take 100 mg by mouth 3 (three) times daily.   . hydrALAZINE (APRESOLINE) 25 MG tablet Take 0.5 tablets (12.5 mg total) by mouth every 8 (eight) hours.  . isosorbide mononitrate (IMDUR) 30 MG 24 hr tablet Take  0.5 tablets (15 mg total) by mouth daily.  Marland Kitchen levothyroxine (SYNTHROID, LEVOTHROID) 125 MCG tablet Take 125 mcg by mouth daily before breakfast.  . loperamide (IMODIUM) 2 MG capsule Take 2-4 mg by mouth 4 (four) times daily as needed for diarrhea or loose stools.  . Magnesium 250 MG TABS Take 250 mg by mouth daily.   . metoprolol succinate (TOPROL-XL) 50 MG 24 hr tablet TAKE 1 TABLET BY MOUTH ONCE DAILY.  . pantoprazole (PROTONIX) 40 MG tablet Take 40 mg by mouth 2 (two) times daily.   . polyethylene glycol (MIRALAX / GLYCOLAX) packet Take 17 g by mouth daily as needed (for constipation.).   Marland Kitchen potassium chloride (K-DUR) 10 MEQ tablet Take 1 tablet (10 mEq total) by mouth every other day.  . pravastatin (PRAVACHOL) 10 MG tablet Take 10 mg by mouth daily.  . Probiotic Product (ALIGN PO) Take 1 tablet by mouth daily.   . Rivaroxaban (XARELTO) 15 MG TABS tablet Take 1 tablet (  15 mg total) by mouth daily. **resume on Monday 1/7.  . sitaGLIPtin (JANUVIA) 50 MG tablet Take 50 mg by mouth daily.  . temazepam (RESTORIL) 15 MG capsule Take 15 mg by mouth at bedtime as needed for sleep.   Marland Kitchen allopurinol (ZYLOPRIM) 100 MG tablet Take 100 mg by mouth 2 (two) times daily.   Marland Kitchen GLIPIZIDE XL 5 MG 24 hr tablet Take 5 mg by mouth daily before breakfast.  . losartan (COZAAR) 50 MG tablet Take 50 mg by mouth daily.  . Omega-3 Fatty Acids (FISH OIL) 1200 MG CAPS Take 1,200 mg by mouth 2 (two) times daily.  . [DISCONTINUED] cyclobenzaprine (FLEXERIL) 5 MG tablet TAKE 1 TO 2 TABLETS BY MOUTH UP TO 3 TIMES A DAY AS NEEDED FOR MUSCLESPASMS.  . [DISCONTINUED] isosorbide mononitrate (IMDUR) 30 MG 24 hr tablet TAKE 1/2 TABLET BY MOUTH DAILY.   No facility-administered encounter medications on file as of 12/12/2017.      Review of Systems  Constitutional:   No  weight loss, night sweats,  Fevers, chills,  +fatigue, or  lassitude.  HEENT:   No headaches,  Difficulty swallowing,  Tooth/dental problems, or  Sore throat,                 No sneezing, itching, ear ache, nasal congestion, post nasal drip,   CV:  No chest pain,  Orthopnea, PND, swelling in lower extremities, anasarca, dizziness, palpitations, syncope.   GI  No heartburn, indigestion, abdominal pain, nausea, vomiting, diarrhea, change in bowel habits, loss of appetite, bloody stools.   Resp: .  No excess mucus, no productive cough,  No non-productive cough,  No coughing up of blood.  No change in color of mucus.  No wheezing.  No chest wall deformity  Skin: no rash or lesions.  GU: no dysuria, change in color of urine, no urgency or frequency.  No flank pain, no hematuria   MS:  No joint pain or swelling.  No decreased range of motion.  No back pain.    Physical Exam  BP 118/78 (BP Location: Left Arm, Cuff Size: Normal)   Pulse 70   Ht 4\' 10"  (1.473 m)   Wt 139 lb (63 kg)   SpO2 98%   BMI 29.05 kg/m   GEN: A/Ox3; pleasant , NAD, elderly    HEENT:  [place]/AT,  EACs-clear, TMs-wnl, NOSE-clear, THROAT-clear, no lesions, no postnasal drip or exudate noted.   NECK:  Supple w/ fair ROM; no JVD; normal carotid impulses w/o bruits; no thyromegaly or nodules palpated; no lymphadenopathy.    RESP  Clear  P & A; w/o, wheezes/ rales/ or rhonchi. no accessory muscle use, no dullness to percussion  CARD:  RRR, no m/r/g, no peripheral edema, pulses intact, no cyanosis or clubbing.  GI:   Soft & nt; nml bowel sounds; no organomegaly or masses detected.   Musco: Warm bil, no deformities or joint swelling noted.   Neuro: alert, no focal deficits noted.    Skin: Warm, no lesions or rashes    Lab Results:  CBC    BNP   Imaging: No results found.   Assessment & Plan:   ILD (interstitial lung disease) ([place]) Previous amiodarone pulmonary toxicity seems to have resolved since amiodarone was discontinued.  Follow-up high-res CT chest showed improvement with no evidence of ILD changes. Clinically patient is improving. Appears to have  improved control of congestive heart failure. Patient continue on current regimen.  Follow-up in 6 months.  Will  check PFT with DLCO on return.  Non-ischemic cardiomyopathy ([place]) Continue follow-up with cardiology.  Appears to be euvolemic without evidence of volume overload on exam     Rexene Edison, NP 12/12/2017

## 2017-12-12 NOTE — Assessment & Plan Note (Signed)
Continue follow-up with cardiology.  Appears to be euvolemic without evidence of volume overload on exam

## 2017-12-12 NOTE — Patient Instructions (Addendum)
Continue on current regimen  Follow up with Dr. Elsworth Soho  In 6 months with spirometry with DLCO  and As needed

## 2017-12-12 NOTE — Assessment & Plan Note (Signed)
Previous amiodarone pulmonary toxicity seems to have resolved since amiodarone was discontinued.  Follow-up high-res CT chest showed improvement with no evidence of ILD changes. Clinically patient is improving. Appears to have improved control of congestive heart failure. Patient continue on current regimen.  Follow-up in 6 months.  Will check PFT with DLCO on return.

## 2017-12-19 ENCOUNTER — Other Ambulatory Visit: Payer: Self-pay | Admitting: Nurse Practitioner

## 2017-12-28 DIAGNOSIS — Z9181 History of falling: Secondary | ICD-10-CM | POA: Diagnosis not present

## 2017-12-28 DIAGNOSIS — R1084 Generalized abdominal pain: Secondary | ICD-10-CM | POA: Diagnosis not present

## 2017-12-28 DIAGNOSIS — E1121 Type 2 diabetes mellitus with diabetic nephropathy: Secondary | ICD-10-CM | POA: Diagnosis not present

## 2017-12-28 DIAGNOSIS — Z6830 Body mass index (BMI) 30.0-30.9, adult: Secondary | ICD-10-CM | POA: Diagnosis not present

## 2018-01-10 ENCOUNTER — Other Ambulatory Visit: Payer: Self-pay | Admitting: *Deleted

## 2018-01-10 ENCOUNTER — Encounter: Payer: Self-pay | Admitting: *Deleted

## 2018-01-10 NOTE — Patient Outreach (Addendum)
[facility]) Care Management  [place]  01/10/2018   [name] 09-08-1955 [id number]   RN Health Coach Monthly Outreach   Referral Date:  06/05/17 Referral Source:  [facility] Calls Reason for Referral:  Disease Management Education Insurance:   [employer] Ad[employer]age   Outreach Attempt:  Successful telephone outreach to patient for monthly follow up.  HIPAA verified with patient.  Patient stating she is feeling like she has a cold.  Describes stuffy nose and dry nonproductive cough for the last week.  Denies any fever at this time.  Reports swelling in her ankles when she is up moving around for periods of time, but denying current swelling in ankles today.  Continues to weigh daily and reporting this mornings weight was 141 pounds, which is within her normal range.  Denies any weight gain over the past few weeks.  Fasting blood sugar this morning was 119 with fasting ranges of 90-110's.  Denies any falls.  Encounter Medications:  Outpatient Encounter Medications as of 01/10/2018  Medication Sig Note  . acetaminophen (TYLENOL) 500 MG tablet Take 1,000 mg by mouth every 6 (six) hours as needed (for pain.).    Marland Kitchen albuterol (PROVENTIL HFA;VENTOLIN HFA) 108 (90 Base) MCG/ACT inhaler Inhale 2 puffs into the lungs every 6 (six) hours as needed for wheezing or shortness of breath.   . calcium carbonate (OS-CAL) 600 MG TABS Take 600 mg by mouth 2 (two) times daily with a meal.     . cholecalciferol (VITAMIN D) 1000 UNITS tablet Take 1,000 Units by mouth daily.   . cyclobenzaprine (FLEXERIL) 5 MG tablet Take 1-2 tabs by mouth up to three times daily for muscle spasms.   . ferrous sulfate 325 (65 FE) MG tablet Take 325 mg by mouth daily with breakfast.   . furosemide (LASIX) 20 MG tablet TAKE ONE TABLET BY MOUTH DAILY AS NEEDED. 01/10/2018: Reports taking 20 mg daily  . gabapentin (NEURONTIN) 100 MG capsule Take 100 mg by mouth 3 (three) times daily.    . hydrALAZINE  (APRESOLINE) 25 MG tablet Take 0.5 tablets (12.5 mg total) by mouth every 8 (eight) hours.   . isosorbide mononitrate (IMDUR) 30 MG 24 hr tablet Take 0.5 tablets (15 mg total) by mouth daily.   Marland Kitchen levothyroxine (SYNTHROID, LEVOTHROID) 125 MCG tablet Take 125 mcg by mouth daily before breakfast.   . loperamide (IMODIUM) 2 MG capsule Take 2-4 mg by mouth 4 (four) times daily as needed for diarrhea or loose stools.   . Magnesium 250 MG TABS Take 250 mg by mouth daily.    . metoprolol succinate (TOPROL-XL) 50 MG 24 hr tablet TAKE 1 TABLET BY MOUTH ONCE DAILY.   . pantoprazole (PROTONIX) 40 MG tablet Take 40 mg by mouth 2 (two) times daily.    . polyethylene glycol (MIRALAX / GLYCOLAX) packet Take 17 g by mouth daily as needed (for constipation.).    Marland Kitchen potassium chloride (K-DUR) 10 MEQ tablet Take 1 tablet (10 mEq total) by mouth every other day. 01/10/2018: Reports taking every day  . pravastatin (PRAVACHOL) 10 MG tablet Take 10 mg by mouth daily.   . Probiotic Product (ALIGN PO) Take 1 tablet by mouth daily.    . sitaGLIPtin (JANUVIA) 50 MG tablet Take 50 mg by mouth daily. 06/27/2017: Have been trying to get samples from MD office for this medication, can not afford copay  . temazepam (RESTORIL) 15 MG capsule Take 15 mg by mouth at bedtime as needed  for sleep.    Alveda Reasons 15 MG TABS tablet TAKE 1 TABLET BY MOUTH ONCE A DAY.   Marland Kitchen allopurinol (ZYLOPRIM) 100 MG tablet Take 100 mg by mouth 2 (two) times daily.  10/08/2017: Patient reports PCP stopped  . GLIPIZIDE XL 5 MG 24 hr tablet Take 5 mg by mouth daily before breakfast. 10/08/2017: Patient reports PCP stopped this medication  . losartan (COZAAR) 50 MG tablet Take 50 mg by mouth daily. 10/08/2017: Patient reports PCP stopped this medication  . Omega-3 Fatty Acids (FISH OIL) 1200 MG CAPS Take 1,200 mg by mouth 2 (two) times daily. 10/08/2017: Patient reports PCP stopped this medication   No facility-administered encounter medications on file as of  01/10/2018.     Functional Status:  In your present state of health, do you have any difficulty performing the following activities: 06/29/2017 06/27/2017  Hearing? N Y  Comment - total hearing loss to left ear, bilateral hearing aides  Vision? N Y  Comment - wears glasses  Difficulty concentrating or making decisions? N N  Walking or climbing stairs? Y Y  Comment - trouble climbing stairs due to shortness of breath  Dressing or bathing? N N  Doing errands, shopping? - Scientist, forensic and eating ? - N  Using the Toilet? - N  In the past six months, have you accidently leaked urine? - N  Do you have problems with loss of bowel control? - Y  Managing your Medications? - N  Managing your Finances? - N  Housekeeping or managing your Housekeeping? - N  Some recent data might be hidden    Fall/Depression Screening: Fall Risk  01/10/2018 10/08/2017 09/04/2017  Falls in the past year? Yes Yes Yes  Comment no falls in the past 3 months no falls over the past month -  Number falls in past yr: - (No Data) 2 or more  Comment - no falls over the last month 3  Injury with Fall? No - No  Risk for fall due to : History of fall(s);Impaired balance/gait;Medication side effect;Impaired mobility;Impaired vision - Impaired balance/gait;Medication side effect;History of fall(s)  Follow up Falls prevention discussed;Education provided Education provided;Falls prevention discussed Falls evaluation completed;Education provided;Falls prevention discussed   PHQ 2/9 Scores 06/27/2017 05/21/2017 12/05/2016  PHQ - 2 Score 1 0 0   THN CM Care Plan Problem One     Most Recent Value  Care Plan Problem One  Knowledge deficiet related to self cae management of congestive heart failure  Role Documenting the Problem One  [place] for Problem One  Active  THN Long Term Goal   Patient will have Cardiologist appointment in the next 90 days.  THN Long Term Goal Start Date  01/10/18  THN Long Term Goal Met  Date  01/10/18  Interventions for Problem One Long Term Goal  Current care plan and goals reviewed and discussed with patient, encouraged to keep and attend medical appointment, reviewed medications and compliance encouraged, encouraged patient to verify with Electrophysiologist if she needs to follow with Cardiologist to manage heart failure, discussed wth patient impotance of heart failure monitoring, encouraged patient to elevate lower extremities while sitting to help with swelling, discussed low sodium diabetic diet  THN CM Short Term Goal #1   Patient will verbalize she has spoken with Dr. Tanna Furry office concerning cardiologist managing her heart failure in the next 30 days.  THN CM Short Term Goal #1 Start Date  01/10/18  Interventions for Short  Term Goal #1  Discussed with patient importance on heart failure monitoring by physician, reminded patient that she needs to verify with Electrophysiologist if she needs to follow up with Cardiologist for heart failure monitoring, requested patient to write reminder down today to call office tomorrow or Monday to request Cardiologist appointment  [facility] CM Short Term Goal #2   Patient will report resolution of cold (cough and stuffy nose) in the next 30 days.  THN CM Short Term Goal #2 Start Date  01/10/18  [facility] CM Short Term Goal #2 Met Date  01/10/18  Interventions for Short Term Goal #2  Encouraged patient to discuss cold (stuffy nose, nonproductive cough) with primary care provider, encouraged patient to continue to weigh daily and monitor for edema in extremities, encouraged patient to monitor her heat exposure, encouraged patient to discuss cold medicines with primary care physician     Appointments:  Patient reports she has a scheduled appointment with Dr. Nevada Crane on 01/18/2018.  States she will call to arrange Cardiology appointment or verify if she needs to follow with Cardiologist.  States she has arranged eye appointment for October 2019 with Dr.  Jorja Loa.  Plan: RN Health Coach will make next monthly outreach to patient in the month of August. [place] will send primary care provider Quarterly Update.  [place] [phone] [email or web address]

## 2018-01-16 DIAGNOSIS — E1121 Type 2 diabetes mellitus with diabetic nephropathy: Secondary | ICD-10-CM | POA: Diagnosis not present

## 2018-01-16 DIAGNOSIS — E559 Vitamin D deficiency, unspecified: Secondary | ICD-10-CM | POA: Diagnosis not present

## 2018-01-16 DIAGNOSIS — E782 Mixed hyperlipidemia: Secondary | ICD-10-CM | POA: Diagnosis not present

## 2018-01-16 DIAGNOSIS — D509 Iron deficiency anemia, unspecified: Secondary | ICD-10-CM | POA: Diagnosis not present

## 2018-01-16 DIAGNOSIS — I10 Essential (primary) hypertension: Secondary | ICD-10-CM | POA: Diagnosis not present

## 2018-01-16 DIAGNOSIS — E1122 Type 2 diabetes mellitus with diabetic chronic kidney disease: Secondary | ICD-10-CM | POA: Diagnosis not present

## 2018-01-16 DIAGNOSIS — E039 Hypothyroidism, unspecified: Secondary | ICD-10-CM | POA: Diagnosis not present

## 2018-01-18 DIAGNOSIS — E782 Mixed hyperlipidemia: Secondary | ICD-10-CM | POA: Diagnosis not present

## 2018-01-18 DIAGNOSIS — E1121 Type 2 diabetes mellitus with diabetic nephropathy: Secondary | ICD-10-CM | POA: Diagnosis not present

## 2018-01-18 DIAGNOSIS — N183 Chronic kidney disease, stage 3 (moderate): Secondary | ICD-10-CM | POA: Diagnosis not present

## 2018-01-18 DIAGNOSIS — Z0001 Encounter for general adult medical examination with abnormal findings: Secondary | ICD-10-CM | POA: Diagnosis not present

## 2018-01-18 DIAGNOSIS — Z6830 Body mass index (BMI) 30.0-30.9, adult: Secondary | ICD-10-CM | POA: Diagnosis not present

## 2018-01-18 DIAGNOSIS — I5040 Unspecified combined systolic (congestive) and diastolic (congestive) heart failure: Secondary | ICD-10-CM | POA: Diagnosis not present

## 2018-01-18 DIAGNOSIS — E559 Vitamin D deficiency, unspecified: Secondary | ICD-10-CM | POA: Diagnosis not present

## 2018-01-18 DIAGNOSIS — D509 Iron deficiency anemia, unspecified: Secondary | ICD-10-CM | POA: Diagnosis not present

## 2018-01-18 DIAGNOSIS — E039 Hypothyroidism, unspecified: Secondary | ICD-10-CM | POA: Diagnosis not present

## 2018-01-18 DIAGNOSIS — M25511 Pain in right shoulder: Secondary | ICD-10-CM | POA: Diagnosis not present

## 2018-01-18 DIAGNOSIS — I481 Persistent atrial fibrillation: Secondary | ICD-10-CM | POA: Diagnosis not present

## 2018-01-18 DIAGNOSIS — M109 Gout, unspecified: Secondary | ICD-10-CM | POA: Diagnosis not present

## 2018-02-01 ENCOUNTER — Other Ambulatory Visit: Payer: Self-pay | Admitting: *Deleted

## 2018-02-01 ENCOUNTER — Encounter: Payer: Self-pay | Admitting: *Deleted

## 2018-02-01 NOTE — Patient Outreach (Signed)
[facility]) Care Management  02/01/2018  [name] 28-May-1956 [id number]   RN Health CoachMonthly Outreach  Referral Date:06/05/17 Referral Source:TOC Calls Reason for Referral:Disease Management Education Insurance:Health Team Advantage   Outreach Attempt:  Successful telephone outreach to patient for monthly follow up.  HIPAA verified with patient.  Patient stating she is doing well.  Denies any sick days, shortness of breath, or swelling in her lower extremities.  Reporting her recent lab work showed her thyroid was "over working", calcium was elevated, and Hgb A1C was 6.2 on 01/18/2018; states her medications (synthroid and calcium) was adjusted.  Fasting blood sugar this morning was 113 with ranges of 100-130's.  Weight this morning was 145 pounds, slightly elevated due to patient missing a dose of her diuretics yesterday.  Discussed medication compliance with patient.  Patient also reporting left shoulder pain and multiple falls on her left side.  Last fall was July 4th per patient.  Verbalizes she has an appointment with orthopedist for shoulder pain in September.  Encouraged patient to discuss falls with her Cardiologist due to pacemaker being on her left chest area.  Appointments:  Patient attended appointment with her primary care provider on 01/18/2018 and has scheduled follow up appointment in November 2019.  Patient encouraged to continue to contact Cardiologist for follow up appointment.  Plan: RN Health Coach will make next monthly outreach to patient in the month of September.  [name] [phone] [name]

## 2018-02-06 DIAGNOSIS — R600 Localized edema: Secondary | ICD-10-CM | POA: Diagnosis not present

## 2018-02-06 DIAGNOSIS — E782 Mixed hyperlipidemia: Secondary | ICD-10-CM | POA: Diagnosis not present

## 2018-02-06 DIAGNOSIS — K210 Gastro-esophageal reflux disease with esophagitis: Secondary | ICD-10-CM | POA: Diagnosis not present

## 2018-02-06 DIAGNOSIS — I10 Essential (primary) hypertension: Secondary | ICD-10-CM | POA: Diagnosis not present

## 2018-02-06 DIAGNOSIS — N184 Chronic kidney disease, stage 4 (severe): Secondary | ICD-10-CM | POA: Diagnosis not present

## 2018-02-06 DIAGNOSIS — E039 Hypothyroidism, unspecified: Secondary | ICD-10-CM | POA: Diagnosis not present

## 2018-02-06 DIAGNOSIS — E1122 Type 2 diabetes mellitus with diabetic chronic kidney disease: Secondary | ICD-10-CM | POA: Diagnosis not present

## 2018-02-06 DIAGNOSIS — I481 Persistent atrial fibrillation: Secondary | ICD-10-CM | POA: Diagnosis not present

## 2018-02-27 ENCOUNTER — Ambulatory Visit (INDEPENDENT_AMBULATORY_CARE_PROVIDER_SITE_OTHER): Payer: PPO | Admitting: Orthopedic Surgery

## 2018-02-27 ENCOUNTER — Encounter: Payer: Self-pay | Admitting: Orthopedic Surgery

## 2018-02-27 ENCOUNTER — Ambulatory Visit (INDEPENDENT_AMBULATORY_CARE_PROVIDER_SITE_OTHER): Payer: PPO

## 2018-02-27 VITALS — BP 127/81 | HR 70 | Ht <= 58 in | Wt 142.0 lb

## 2018-02-27 DIAGNOSIS — M25512 Pain in left shoulder: Secondary | ICD-10-CM

## 2018-02-27 DIAGNOSIS — M7542 Impingement syndrome of left shoulder: Secondary | ICD-10-CM | POA: Diagnosis not present

## 2018-02-27 NOTE — Patient Instructions (Signed)
Shoulder Impingement Syndrome Shoulder impingement syndrome is a condition that causes pain when connective tissues (tendons) surrounding the shoulder joint become pinched. These tendons are part of the group of muscles and tissues that help to stabilize the shoulder (rotator cuff). Beneath the rotator cuff is a fluid-filled sac (bursa) that allows the muscles and tendons to glide smoothly. The bursa may become swollen or irritated (bursitis). Bursitis, swelling in the rotator cuff tendons, or both conditions can decrease how much space is under a bone in the shoulder joint (acromion), resulting in impingement. What are the causes? Shoulder impingement syndrome can be caused by bursitis or swelling of the rotator cuff tendons, which may result from:  Repetitive overhead arm movements.  Falling onto the shoulder.  Weakness in the shoulder muscles.  What increases the risk? You may be more likely to develop this condition if you are an athlete who participates in:  Sports that involve throwing, such as baseball.  Tennis.  Swimming.  Volleyball.  Some people are also more likely to develop impingement syndrome because of the shape of their acromion bone. What are the signs or symptoms? The main symptom of this condition is pain on the front or side of the shoulder. Pain may:  Get worse when lifting or raising the arm.  Get worse at night.  Wake you up from sleeping.  Feel sharp when the shoulder is moved, and then fade to an ache.  Other signs and symptoms may include:  Tenderness.  Stiffness.  Inability to raise the arm above shoulder level or behind the body.  Weakness.  How is this diagnosed? This condition may be diagnosed based on:  Your symptoms.  Your medical history.  A physical exam.  Imaging tests, such as: ? X-rays. ? MRI. ? Ultrasound.  How is this treated? Treatment for this condition may include:  Resting your shoulder and avoiding all  activities that cause pain or put stress on the shoulder.  Icing your shoulder.  NSAIDs to help reduce pain and swelling.  One or more injections of medicines to numb the area and reduce inflammation.  Physical therapy.  Surgery. This may be needed if nonsurgical treatments have not helped. Surgery may involve repairing the rotator cuff, reshaping the acromion, or removing the bursa.  Follow these instructions at home: Managing pain, stiffness, and swelling  If directed, apply ice to the injured area. ? Put ice in a plastic bag. ? Place a towel between your skin and the bag. ? Leave the ice on for 20 minutes, 2-3 times a day. Activity  Rest and return to your normal activities as told by your health care provider. Ask your health care provider what activities are safe for you.  Do exercises as told by your health care provider. General instructions  Do not use any tobacco products, including cigarettes, chewing tobacco, or e-cigarettes. Tobacco can delay healing. If you need help quitting, ask your health care provider.  Ask your health care provider when it is safe for you to drive.  Take over-the-counter and prescription medicines only as told by your health care provider.  Keep all follow-up visits as told by your health care provider. This is important. How is this prevented?  Give your body time to rest between periods of activity.  Be safe and responsible while being active to avoid falls.  Maintain physical fitness, including strength and flexibility. Contact a health care provider if:  Your symptoms have not improved after 1-2 months of treatment and   rest.  You cannot lift your arm away from your body. This information is not intended to replace advice given to you by your health care provider. Make sure you discuss any questions you have with your health care provider. Document Released: 06/12/2005 Document Revised: 02/17/2016 Document Reviewed:  05/15/2015 Elsevier Interactive Patient Education  2018 [employer].  Shoulder Range of Motion Exercises Shoulder range of motion (ROM) exercises are designed to keep the shoulder moving freely. They are often recommended for people who have shoulder pain. Phase 1 exercises When you are able, do this exercise 5-6 days per week, or as told by your health care provider. Work toward doing 2 sets of 10 swings. Pendulum Exercise How To Do This Exercise Lying Down 1. Lie face-down on a bed with your abdomen close to the side of the bed. 2. Let your arm hang over the side of the bed. 3. Relax your shoulder, arm, and hand. 4. Slowly and gently swing your arm forward and back. Do not use your neck muscles to swing your arm. They should be relaxed. If you are struggling to swing your arm, have someone gently swing it for you. When you do this exercise for the first time, swing your arm at a 15 degree angle for 15 seconds, or swing your arm 10 times. As pain lessens over time, increase the angle of the swing to 30-45 degrees. 5. Repeat steps 1-4 with the other arm.  How To Do This Exercise While Standing 1. Stand next to a sturdy chair or table and hold on to it with your hand. 1. Bend forward at the waist. 2. Bend your knees slightly. 3. Relax your other arm and let it hang limp. 4. Relax the shoulder blade of the arm that is hanging and let it drop. 5. While keeping your shoulder relaxed, use body motion to swing your arm in small circles. The first time you do this exercise, swing your arm for about 30 seconds or 10 times. When you do it next time, swing your arm for a little longer. 6. Stand up tall and relax. 7. Repeat steps 1-7, this time changing the direction of the circles. 2. Repeat steps 1-8 with the other arm.  Phase 2 exercises Do these exercises 3-4 times per day on 5-6 days per week or as told by your health care provider. Work toward holding the stretch for 20 seconds. Stretching  Exercise 1 1. Lift your arm straight out in front of you. 2. Bend your arm 90 degrees at the elbow (right angle) so your forearm goes across your body and looks like the letter "L." 3. Use your other arm to gently pull the elbow forward and across your body. 4. Repeat steps 1-3 with the other arm. Stretching Exercise 2 You will need a towel or rope for this exercise. 1. Bend one arm behind your back with the palm facing outward. 2. Hold a towel with your other hand. 3. Reach the arm that holds the towel above your head, and bend that arm at the elbow. Your wrist should be behind your neck. 4. Use your free hand to grab the free end of the towel. 5. With the higher hand, gently pull the towel up behind you. 6. With the lower hand, pull the towel down behind you. 7. Repeat steps 1-6 with the other arm.  Phase 3 exercises Do each of these exercises at four different times of day (sessions) every day or as told by your health  care provider. To begin with, repeat each exercise 5 times (repetitions). Work toward doing 3 sets of 12 repetitions or as told by your health care provider. Strengthening Exercise 1 You will need a light weight for this activity. As you grow stronger, you may use a heavier weight. 1. Standing with a weight in your hand, lift your arm straight out to the side until it is at the same height as your shoulder. 2. Bend your arm at 90 degrees so that your fingers are pointing to the ceiling. 3. Slowly raise your hand until your arm is straight up in the air. 4. Repeat steps 1-3 with the other arm.  Strengthening Exercise 2 You will need a light weight for this activity. As you grow stronger, you may use a heavier weight. 1. Standing with a weight in your hand, gradually move your straight arm in an arc, starting at your side, then out in front of you, then straight up over your head. 2. Gradually move your other arm in an arc, starting at your side, then out in front of you,  then straight up over your head. 3. Repeat steps 1-2 with the other arm.  Strengthening Exercise 3 You will need an elastic band for this activity. As you grow stronger, gradually increase the size of the bands or increase the number of bands that you use at one time. 1. While standing, hold an elastic band in one hand and raise that arm up in the air. 2. With your other hand, pull down the band until that hand is by your side. 3. Repeat steps 1-2 with the other arm.  This information is not intended to replace advice given to you by your health care provider. Make sure you discuss any questions you have with your health care provider. Document Released: 03/11/2003 Document Revised: 02/06/2016 Document Reviewed: 06/08/2014 Elsevier Interactive Patient Education  [employer].

## 2018-02-27 NOTE — Progress Notes (Signed)
Progress Note   Patient ID: [name], female   DOB: [birth year], 62 y.o.   MRN: [id number]   Chief Complaint  Patient presents with  . Shoulder Pain    Left shoulder pain, referred from Dr. Nevada Crane.    HPI The patient presents for evaluation of left shoulder pain.  62 years old fell 2 months ago having pain anterolateral left shoulder and trouble lifting her arm  Location left shoulder for 2 months Duration 2 months Quality dull ache Severity mild at rest moderate with activity Associated with loss of motion  Review of Systems  Constitutional: Negative for chills and fever.  Musculoskeletal: Negative for neck pain.  Skin: Negative for rash.  Neurological: Negative for tingling.   No outpatient medications have been marked as taking for the 02/27/18 encounter (Office Visit) with Carole Civil, MD.    Past Medical History:  Diagnosis Date  . Amiodarone toxicity   . Anemia   . Chronic bronchitis   . Chronic systolic CHF (congestive heart failure) ([place])    a. 04/2017 Echo: EF 25%.  . Congenital heart disease   . Diabetes mellitus without complication ([place])    Type II  . Diverticulosis   . Fall due to stumbling October 08, 2013   Due to shoes  . GERD (gastroesophageal reflux disease)   . Gout   . Hemorrhoids   . Hyperlipidemia   . Hyperparathyroidism   . Hypertension   . Hypothyroidism   . IDA (iron deficiency anemia)   . Microscopic colitis 9/11   Colonoscopy  . NICM (nonischemic cardiomyopathy) - tachycardia induced in the setting of AFib ([place])    a. 04/2017 Echo: EF 25%, basal-mid inferolateral, inf AK, triv AI, mild MR, sev dil LA, mod red RV fxn, mildly dil RA, mild to mod TR, PASP 42mmHg.  . Non-obstructive CAD (coronary artery disease)    a. 04/2017 Cath: LM 20p, LCX nl, RCA 20p/m.  Marland Kitchen Paroxysmal SVT (supraventricular tachycardia) ([place])   . Persistent atrial fibrillation ([place]) 06/2013   a. 04/2017 Admit with CHF/NICM/AFib -> CHA2DS2VASc = 4-->Xarelto 15  mg daily started; b. 06/2017 s/p BiV PPM (SJM ser # [id number]) and AVN ablation.  . Presence of permanent cardiac pacemaker 06/29/2017   Biventricular pacemaker placed  . PVD (peripheral vascular disease) ([place]) 09/2004   Right common femoral endarterectomy in April of 2007  . Renal insufficiency   . Schatzki's ring    Last EGD with esophageal dilatation 108F  9/11  . Scoliosis   . Varicose veins right leg pain and swelling     Allergies  Allergen Reactions  . Flecainide Nausea Only and Other (See Comments)    Faint feeling  . Hydrocodone-Acetaminophen Nausea Only and Other (See Comments)    Severe headache  . Ibuprofen Other (See Comments)    Kidney dysfunction  . Oxycodone Hcl Nausea Only and Other (See Comments)    Headache  . Penicillins Nausea Only and Other (See Comments)    Severe headache Has patient had a PCN reaction causing immediate rash, facial/tongue/throat swelling, SOB or lightheadedness with hypotension: No Has patient had a PCN reaction causing severe rash involving mucus membranes or skin necrosis: No Has patient had a PCN reaction that required hospitalization: No Has patient had a PCN reaction occurring within the last 10 years: No If all of the above answers are "NO", then may proceed with Cephalosporin use.      BP 127/81   Pulse 70   Ht 4'  10" (1.473 m)   Wt 142 lb (64.4 kg)   BMI 29.68 kg/m    Physical Exam General appearance normal Oriented x3 normal Mood pleasant affect normal Gait normal  Ortho Exam Right upper extremity Inspection and palpation revealed no abnormalities Range of motion is full No instability was detected on stress testing Muscle tone and strength was normal without tremor Skin was warm dry and intact Good pulse and temperature were noted in the extremity Sensation revealed no abnormalities to light Wright  Left upper extremity Tenderness anterolateral shoulder and joint line Active range of motion 120 degrees passive  range of motion 150 degrees painful arc of motion 120 to 150 degrees drop arm test negative stable in abduction external rotation no weakness in the empty can position skin warm dry and intact she has multiple lesions which are chronic but I have advised her to see her medical doctor or dermatology to have them checked once a year as they are pigmented Positive impingement sign at 120 degrees She has normal pulse and temperature in the left upper extremity and normal sensation to light Wright     MEDICAL DECISION MAKING   Imaging:  No fracture or dislocation mild arthritis please see report   Encounter Diagnoses  Name Primary?  . Pain in joint of left shoulder   . Impingement syndrome of left shoulder Yes     PLAN: (RX., injection, surgery,frx,mri/ct, XR 2 body ares) Inject left shoulder subacromial space  Exercises Codman exercises.  Follow-up as needed  No orders of the defined types were placed in this encounter.  2:58 PM 02/27/2018

## 2018-03-08 DIAGNOSIS — E782 Mixed hyperlipidemia: Secondary | ICD-10-CM | POA: Diagnosis not present

## 2018-03-08 DIAGNOSIS — I5040 Unspecified combined systolic (congestive) and diastolic (congestive) heart failure: Secondary | ICD-10-CM | POA: Diagnosis not present

## 2018-03-08 DIAGNOSIS — M109 Gout, unspecified: Secondary | ICD-10-CM | POA: Diagnosis not present

## 2018-03-08 DIAGNOSIS — E1122 Type 2 diabetes mellitus with diabetic chronic kidney disease: Secondary | ICD-10-CM | POA: Diagnosis not present

## 2018-03-08 DIAGNOSIS — E039 Hypothyroidism, unspecified: Secondary | ICD-10-CM | POA: Diagnosis not present

## 2018-03-08 DIAGNOSIS — E559 Vitamin D deficiency, unspecified: Secondary | ICD-10-CM | POA: Diagnosis not present

## 2018-03-08 DIAGNOSIS — K210 Gastro-esophageal reflux disease with esophagitis: Secondary | ICD-10-CM | POA: Diagnosis not present

## 2018-03-08 DIAGNOSIS — I5042 Chronic combined systolic (congestive) and diastolic (congestive) heart failure: Secondary | ICD-10-CM | POA: Diagnosis not present

## 2018-03-08 DIAGNOSIS — I481 Persistent atrial fibrillation: Secondary | ICD-10-CM | POA: Diagnosis not present

## 2018-03-08 DIAGNOSIS — I10 Essential (primary) hypertension: Secondary | ICD-10-CM | POA: Diagnosis not present

## 2018-03-11 ENCOUNTER — Other Ambulatory Visit: Payer: Self-pay | Admitting: *Deleted

## 2018-03-11 NOTE — Patient Outreach (Signed)
[facility]) Care Management  03/11/2018  [name] Nov 07, 1955 [id number]   RN Health CoachMonthly Outreach  Referral Date:06/05/17 Referral Source:TOC Calls Reason for Referral:Disease Management Education Insurance:[employer]   Outreach Attempt:  Outreach attempt #1 to patient for monthly follow up. No answer and unable to leave voicemail message due to voicemail not engaging.  Plan:  RN Health Coach will make next telephone outreach to patient in the month of September.  [name] [phone] [name]

## 2018-03-19 ENCOUNTER — Encounter: Payer: Self-pay | Admitting: *Deleted

## 2018-03-19 ENCOUNTER — Other Ambulatory Visit: Payer: Self-pay | Admitting: *Deleted

## 2018-03-19 NOTE — Patient Outreach (Signed)
[facility]) Care Management  [place]  03/19/2018   ADDALIE CALLES 06/20/1956 [id number]   RN Health Coach Monthly Outreach   Referral Date:  06/05/17 Referral Source:  [facility] Calls Reason for Referral:  Disease Management Education Insurance:   [employer]   Outreach Attempt:  Successful telephone outreach to patient for monthly follow up.  HIPAA verified with patient.  Patient stating she is doing well, attempting to find a job since being laid off last year.  Continues to weigh daily.  Weight this morning was 142 pounds.  Reports she has seen the orthopedist concerning pain in her shoulder and has received a steroid injection.  Reports shoulder feels better.  Physician was concerned with boils and moles on her back and neck.  Patient stating she will request to see a dermatologist.  Reports her blood sugars have ranged [phone]'s fasting.  Encounter Medications:  Outpatient Encounter Medications as of 03/19/2018  Medication Sig Note  . acetaminophen (TYLENOL) 500 MG tablet Take 1,000 mg by mouth every 6 (six) hours as needed (for pain.).    Marland Kitchen albuterol (PROVENTIL HFA;VENTOLIN HFA) 108 (90 Base) MCG/ACT inhaler Inhale 2 puffs into the lungs every 6 (six) hours as needed for wheezing or shortness of breath.   . calcium carbonate (OS-CAL) 600 MG TABS Take 600 mg by mouth 2 (two) times daily with a meal.   02/01/2018: Patient reports taking once a day per primary care providers recommendation  . cholecalciferol (VITAMIN D) 1000 UNITS tablet Take 1,000 Units by mouth daily.   . ferrous sulfate 325 (65 FE) MG tablet Take 325 mg by mouth daily with breakfast.   . furosemide (LASIX) 20 MG tablet TAKE ONE TABLET BY MOUTH DAILY AS NEEDED. 01/10/2018: Reports taking 20 mg daily  . gabapentin (NEURONTIN) 100 MG capsule Take 100 mg by mouth 3 (three) times daily.    . hydrALAZINE (APRESOLINE) 25 MG tablet Take 0.5 tablets (12.5 mg total) by mouth every 8 (eight)  hours.   . isosorbide mononitrate (IMDUR) 30 MG 24 hr tablet Take 0.5 tablets (15 mg total) by mouth daily.   Marland Kitchen levothyroxine (SYNTHROID, LEVOTHROID) 125 MCG tablet Take 125 mcg by mouth daily before breakfast.   . loperamide (IMODIUM) 2 MG capsule Take 2-4 mg by mouth 4 (four) times daily as needed for diarrhea or loose stools.   . Magnesium 250 MG TABS Take 250 mg by mouth daily.    . metoprolol succinate (TOPROL-XL) 50 MG 24 hr tablet TAKE 1 TABLET BY MOUTH ONCE DAILY.   . pantoprazole (PROTONIX) 40 MG tablet Take 40 mg by mouth 2 (two) times daily.    . polyethylene glycol (MIRALAX / GLYCOLAX) packet Take 17 g by mouth daily as needed (for constipation.).    Marland Kitchen potassium chloride (K-DUR) 10 MEQ tablet Take 1 tablet (10 mEq total) by mouth every other day. 01/10/2018: Reports taking every day  . pravastatin (PRAVACHOL) 10 MG tablet Take 10 mg by mouth daily.   . Probiotic Product (ALIGN PO) Take 1 tablet by mouth daily.    . sitaGLIPtin (JANUVIA) 50 MG tablet Take 50 mg by mouth daily.   . temazepam (RESTORIL) 15 MG capsule Take 15 mg by mouth at bedtime as needed for sleep.    Alveda Reasons 15 MG TABS tablet TAKE 1 TABLET BY MOUTH ONCE A DAY.   Marland Kitchen allopurinol (ZYLOPRIM) 100 MG tablet Take 100 mg by mouth 2 (two) times daily.  10/08/2017: Patient reports  PCP stopped  . cyclobenzaprine (FLEXERIL) 5 MG tablet Take 1-2 tabs by mouth up to three times daily for muscle spasms. (Patient not taking: Reported on 03/19/2018) 03/19/2018: Has not taken in the last month  . GLIPIZIDE XL 5 MG 24 hr tablet Take 5 mg by mouth daily before breakfast. 10/08/2017: Patient reports PCP stopped this medication  . losartan (COZAAR) 50 MG tablet Take 50 mg by mouth daily. 10/08/2017: Patient reports PCP stopped this medication  . Omega-3 Fatty Acids (FISH OIL) 1200 MG CAPS Take 1,200 mg by mouth 2 (two) times daily. 10/08/2017: Patient reports PCP stopped this medication   No facility-administered encounter medications on file  as of 03/19/2018.     Functional Status:  In your present state of health, do you have any difficulty performing the following activities: 06/29/2017 06/27/2017  Hearing? N Y  Comment - total hearing loss to left ear, bilateral hearing aides  Vision? N Y  Comment - wears glasses  Difficulty concentrating or making decisions? N N  Walking or climbing stairs? Y Y  Comment - trouble climbing stairs due to shortness of breath  Dressing or bathing? N N  Doing errands, shopping? - Scientist, forensic and eating ? - N  Using the Toilet? - N  In the past six months, have you accidently leaked urine? - N  Do you have problems with loss of bowel control? - Y  Managing your Medications? - N  Managing your Finances? - N  Housekeeping or managing your Housekeeping? - N  Some recent data might be hidden    Fall/Depression Screening: Fall Risk  02/01/2018 01/10/2018 10/08/2017  Falls in the past year? Yes Yes Yes  Comment patient reporting a fall on July 4th no falls in the past 3 months no falls over the past month  Number falls in past yr: 2 or more - (No Data)  Comment - - no falls over the last month  Injury with Fall? No No -  Risk Factor Category  High Fall Risk - -  Risk for fall due to : History of fall(s);Impaired balance/gait;Impaired mobility;Medication side effect History of fall(s);Impaired balance/gait;Medication side effect;Impaired mobility;Impaired vision -  Follow up Falls prevention discussed;Education provided Falls prevention discussed;Education provided Education provided;Falls prevention discussed   PHQ 2/9 Scores 06/27/2017 05/21/2017 12/05/2016  PHQ - 2 Score 1 0 0   THN CM Care Plan Problem One     Most Recent Value  Care Plan Problem One  Knowledge deficiet related to self cae management of congestive heart failure  Role Documenting the Problem One  [place] for Problem One  Active  THN Long Term Goal   Patient will have Cardiologist appointment in the next 90  days.  THN Long Term Goal Start Date  03/19/18  Interventions for Problem One Long Term Goal  Encouraged patient to contact Cardiologist office in the morning hours to try and make an appointment, encouraged patient to discuss cardiology follow up with her primary care provider, discussed importance of heart failure management, encouraged continued daily weight monitoring, discussed signs and symptoms of heart failure and when to call the physician  [facility] CM Short Term Goal #1   Patient will attend eye appointment on 04/09/2018.  THN CM Short Term Goal #1 Start Date  03/19/18  Interventions for Short Term Goal #1  Discussed importance of yearly eye exams, encouraged patient to attend eye exam appointment coming up in October  [facility] CM Short Term  Goal #2   Patient will report no falls in the next 30 days.  THN CM Short Term Goal #2 Start Date  02/01/18  [facility] CM Short Term Goal #2 Met Date  03/19/18     Appointments:  States she has an eye exam scheduled for 04/09/2018.  Appointment with primary care provider, Dr. Nevada Crane on 05/28/2018.  Scheduled appointment with Pulmonologist, Dr. Elsworth Soho on 04/26/2018.  Encouraged to contact Cardiologist for follow up appointment.  Plan: RN Health Coach will send primary care provider Quarterly Update. RN Health Coach will make next telephone outreach to patient in the month of December.  [place] [phone] [email or web address]

## 2018-03-20 ENCOUNTER — Other Ambulatory Visit (HOSPITAL_COMMUNITY)
Admission: RE | Admit: 2018-03-20 | Discharge: 2018-03-20 | Disposition: A | Discharge status: Home / Self Care | Payer: PPO | Source: Ambulatory Visit | Attending: Adult Health | Admitting: Adult Health

## 2018-03-20 ENCOUNTER — Other Ambulatory Visit: Payer: Self-pay

## 2018-03-20 ENCOUNTER — Encounter: Payer: Self-pay | Admitting: Adult Health

## 2018-03-20 ENCOUNTER — Ambulatory Visit (INDEPENDENT_AMBULATORY_CARE_PROVIDER_SITE_OTHER): Payer: PPO | Admitting: Adult Health

## 2018-03-20 VITALS — BP 134/84 | HR 69 | Ht 58.5 in | Wt 140.0 lb

## 2018-03-20 DIAGNOSIS — Z124 Encounter for screening for malignant neoplasm of cervix: Secondary | ICD-10-CM | POA: Diagnosis not present

## 2018-03-20 DIAGNOSIS — Z1211 Encounter for screening for malignant neoplasm of colon: Secondary | ICD-10-CM | POA: Diagnosis not present

## 2018-03-20 DIAGNOSIS — Z01419 Encounter for gynecological examination (general) (routine) without abnormal findings: Secondary | ICD-10-CM

## 2018-03-20 DIAGNOSIS — Z1212 Encounter for screening for malignant neoplasm of rectum: Secondary | ICD-10-CM

## 2018-03-20 LAB — HEMOCCULT GUIAC POC 1CARD (OFFICE): FECAL OCCULT BLD: NEGATIVE

## 2018-03-20 NOTE — Progress Notes (Signed)
  Subjective:     Patient ID: [name], female   DOB: October 10, 1955, 62 y.o.   MRN: [id number]  HPI Narcisa is a 62 year old white female, single, PM, in for pap and pelvic. She had pacemaker placed this year.  PCP is Dr Nevada Crane.  Review of Systems No vaginal bleeding, is not sexually active Reviewed past medical,surgical, social and family history. Reviewed medications and allergies.     Objective:   Physical Exam BP 134/84 (BP Location: Right Arm, Patient Position: Sitting, Cuff Size: Normal)   Pulse 69   Ht 4' 10.5" (1.486 m)   Wt 140 lb (63.5 kg)   BMI 28.76 kg/m  Skin warm and dry.Pelvic: external genitalia is normal in appearance no lesions, vagina: pale,with loss of moisture and rugae,urethra has no lesions or masses noted, cervix:not visualized, pap with HPV performed,uterus: normal size, shape and contour, non tender, no masses felt, adnexa: no masses or tenderness noted. Bladder is non tender and no masses felt.On rectal exam has good tone, no masses felt, hemoccult is negative. Examination chaperoned by Shela Nevin, RN    Assessment:     1. Encounter for gynecological examination with Papanicolaou smear of cervix   2. Routine cervical smear   3. Screening for colorectal cancer       Plan:     Pap in 2 years

## 2018-03-22 LAB — CYTOLOGY - PAP
DIAGNOSIS: NEGATIVE
HPV (WINDOPATH): NOT DETECTED

## 2018-03-25 DIAGNOSIS — H9201 Otalgia, right ear: Secondary | ICD-10-CM | POA: Diagnosis not present

## 2018-03-27 DIAGNOSIS — Z23 Encounter for immunization: Secondary | ICD-10-CM | POA: Diagnosis not present

## 2018-04-19 DIAGNOSIS — J060 Acute laryngopharyngitis: Secondary | ICD-10-CM | POA: Diagnosis not present

## 2018-05-03 ENCOUNTER — Encounter: Payer: Self-pay | Admitting: Student

## 2018-05-03 ENCOUNTER — Other Ambulatory Visit (HOSPITAL_COMMUNITY)
Admission: RE | Admit: 2018-05-03 | Discharge: 2018-05-03 | Disposition: A | Discharge status: Home / Self Care | Payer: PPO | Source: Ambulatory Visit | Attending: Student | Admitting: Student

## 2018-05-03 ENCOUNTER — Ambulatory Visit (INDEPENDENT_AMBULATORY_CARE_PROVIDER_SITE_OTHER): Payer: PPO | Admitting: Student

## 2018-05-03 VITALS — BP 126/84 | HR 76 | Ht 58.5 in | Wt 146.4 lb

## 2018-05-03 DIAGNOSIS — Z7901 Long term (current) use of anticoagulants: Secondary | ICD-10-CM | POA: Diagnosis not present

## 2018-05-03 DIAGNOSIS — I428 Other cardiomyopathies: Secondary | ICD-10-CM | POA: Diagnosis not present

## 2018-05-03 DIAGNOSIS — N183 Chronic kidney disease, stage 3 unspecified: Secondary | ICD-10-CM

## 2018-05-03 DIAGNOSIS — Z79899 Other long term (current) drug therapy: Secondary | ICD-10-CM | POA: Diagnosis not present

## 2018-05-03 DIAGNOSIS — I5042 Chronic combined systolic (congestive) and diastolic (congestive) heart failure: Secondary | ICD-10-CM

## 2018-05-03 DIAGNOSIS — I10 Essential (primary) hypertension: Secondary | ICD-10-CM

## 2018-05-03 DIAGNOSIS — R600 Localized edema: Secondary | ICD-10-CM

## 2018-05-03 DIAGNOSIS — I4819 Other persistent atrial fibrillation: Secondary | ICD-10-CM | POA: Diagnosis not present

## 2018-05-03 LAB — BASIC METABOLIC PANEL
Anion gap: 6 (ref 5–15)
BUN: 17 mg/dL (ref 8–23)
CHLORIDE: 107 mmol/L (ref 98–111)
CO2: 26 mmol/L (ref 22–32)
Calcium: 9.2 mg/dL (ref 8.9–10.3)
Creatinine, Ser: 1.51 mg/dL — ABNORMAL HIGH (ref 0.44–1.00)
GFR, EST AFRICAN AMERICAN: 42 mL/min — AB (ref >60)
GFR, EST NON AFRICAN AMERICAN: 36 mL/min — AB (ref >60)
Glucose, Bld: 74 mg/dL (ref 70–99)
Potassium: 4.4 mmol/L (ref 3.5–5.1)
SODIUM: 139 mmol/L (ref 135–145)

## 2018-05-03 LAB — BRAIN NATRIURETIC PEPTIDE: B NATRIURETIC PEPTIDE 5: 894 pg/mL — AB (ref 0.0–100.0)

## 2018-05-03 NOTE — Progress Notes (Signed)
Cardiology Office Note    Date:  05/03/2018   ID:  [name], DOB [birth year], MRN [id number]  PCP:  Celene Squibb, MD  Cardiologist: Cristopher Peru, MD    Chief Complaint  Patient presents with  . Follow-up    lower extremity edema    History of Present Illness:    [name] is a 62 y.o. female with past medical history of persistent atrial fibrillation (intolerant to multiple medications and developed Amiodarone lung toxicity, s/p AV Node Ablation in 06/2017 with placement of St. Jude BiV PPM), tachycardia-induced cardiomyopathy (EF 25% by echo in 04/2017 with cath showing nonobstructive disease), HTN, HLD, and Stage 3 CKD who presents to the office today for evaluation of worsening edema.   She was last examined by Dr. Lovena Le on 10/18/2017 and reported having baseline dyspnea on exertion but denied any recent change in her symptoms. Weight was stable at 141 lbs and she was continued on her current medication regimen. Her device was interrogated at that time and working normally, therefore she was informed to follow-up in 9 months.   In talking with the patient today, she reports having worsening lower extremity edema over the past few weeks. She has noted an associated weight gain of approximately 5+ pounds on her home scales. Reports that she received the flu shot over a month ago and has experienced a dry cough since. She is unsure if this is due to having received the flu shot or fluid accumulation. She denies any recent dyspnea on exertion, orthopnea, PND, chest pain, or palpitations.  She reports taking her Lasix approximately 4 to 5 days/week and skips this on days that she has errands to run due to frequent urination. She has also been adding salt to food.   Past Medical History:  Diagnosis Date  . Amiodarone toxicity   . Anemia   . Chronic bronchitis   . Chronic systolic CHF (congestive heart failure) ([place])    a. 04/2017 Echo: EF 25%.  . Congenital heart disease    . Diabetes mellitus without complication ([place])    Type II  . Diverticulosis   . Fall due to stumbling October 08, 2013   Due to shoes  . GERD (gastroesophageal reflux disease)   . Gout   . Hemorrhoids   . Hyperlipidemia   . Hyperparathyroidism   . Hypertension   . Hypothyroidism   . IDA (iron deficiency anemia)   . Microscopic colitis 9/11   Colonoscopy  . NICM (nonischemic cardiomyopathy) - tachycardia induced in the setting of AFib ([place])    a. 04/2017 Echo: EF 25%, basal-mid inferolateral, inf AK, triv AI, mild MR, sev dil LA, mod red RV fxn, mildly dil RA, mild to mod TR, PASP 33mmHg.  . Non-obstructive CAD (coronary artery disease)    a. 04/2017 Cath: LM 20p, LCX nl, RCA 20p/m.  Marland Kitchen Paroxysmal SVT (supraventricular tachycardia) ([place])   . Persistent atrial fibrillation 06/2013   a. 04/2017 Admit with CHF/NICM/AFib -> CHA2DS2VASc = 4-->Xarelto 15 mg daily started; b. 06/2017 s/p BiV PPM (SJM ser # [id number]) and AVN ablation.  . Presence of permanent cardiac pacemaker 06/29/2017   Biventricular pacemaker placed  . PVD (peripheral vascular disease) ([place]) 09/2004   Right common femoral endarterectomy in April of 2007  . Renal insufficiency   . Schatzki's ring    Last EGD with esophageal dilatation 18F  9/11  . Scoliosis   . Varicose veins right leg pain and swelling  Past Surgical History:  Procedure Laterality Date  . ASD REPAIR  Age 78   [facility]  . AV NODE ABLATION N/A 06/29/2017   Procedure: AV NODE ABLATION;  Surgeon: Evans Lance, MD;  Location: [place] [facility] LAB;  Service: Cardiovascular;  Laterality: N/A;  . BIOPSY  02/13/2017   Procedure: BIOPSY;  Surgeon: Daneil Dolin, MD;  Location[facility];  Service: Endoscopy;;  duodenum gastric  . BIV PACEMAKER INSERTION CRT-P N/A 06/29/2017   Procedure: BIV PACEMAKER INSERTION CRT-P;  Surgeon: Evans Lance, MD;  Location: [place] [facility] LAB;  Service: Cardiovascular;  Laterality:  N/A;  . CARDIOVERSION N/A 11/13/2013   Procedure: CARDIOVERSION;  Surgeon: Herminio Commons, MD;  Location: [facility] ORS;  Service: Endoscopy;  Laterality: N/A;  . COLONOSCOPY  03/04/2010   anal canal hemorrhoids otherwise normal. TI normal. Bx showed lymphocytic colitis. next TCS 02/2020  . COLONOSCOPY  April 2008   [place]: Pancolonic diverticulosis, external hemorrhoids  . COLONOSCOPY N/A 11/30/2015   RMR: Normal terminal ileum for 10 cm, nonbleeding grade 1 internal hemorrhoids, colonic diverticulosis. Next colonoscopy in 2027.  Marland Kitchen ELAS  06-01-11   Right saphenous ELAS   . ESOPHAGEAL DILATION N/A 05/28/2015   Procedure: ESOPHAGEAL DILATION;  Surgeon: Daneil Dolin, MD;  Location[facility];  Service: Endoscopy;  Laterality: N/A;  . ESOPHAGOGASTRODUODENOSCOPY  03/04/2010   noncritical appearing Schatzki ring. SB bx negative  . ESOPHAGOGASTRODUODENOSCOPY  05/2008   erosive reflux esophagatitis, noncritical Schatzki ring  . ESOPHAGOGASTRODUODENOSCOPY N/A 05/28/2015   ZLD:[id number] esophagus somewhat baggy, likely due to underlying esophageal motility disorder/small HH   . ESOPHAGOGASTRODUODENOSCOPY N/A 02/13/2017   Dr. Gala Romney: subtly abnormal stomach of doubtful clinical significant s/p biopsy, normal duodenum s/p biopsy, fundic gland polyp on path, normal duodenal biopsy  . [place]  2010  . Left hemithyroidectomy    . Left parathyroidectomy  2007  . Parathyroid adenoma  2007  . Right common femoral endarterectomy  2007  . RIGHT/LEFT HEART CATH AND CORONARY ANGIOGRAPHY N/A 05/15/2017   Procedure: RIGHT/LEFT HEART CATH AND CORONARY ANGIOGRAPHY;  Surgeon: Troy Sine, MD;  Location: [place] CV LAB;  Service: Cardiovascular;  Laterality: N/A;  . Small bowel capsule  12 2009   Mid to distal small bowel with edema, erosions, tiny ulceration felt to be NSAID related  . TONSILLECTOMY      Current Medications: Outpatient Medications Prior to Visit  Medication Sig Dispense Refill  .  acetaminophen (TYLENOL) 500 MG tablet Take 1,000 mg by mouth every 6 (six) hours as needed (for pain.).     Marland Kitchen albuterol (PROVENTIL HFA;VENTOLIN HFA) 108 (90 Base) MCG/ACT inhaler Inhale 2 puffs into the lungs every 6 (six) hours as needed for wheezing or shortness of breath. 1 Inhaler 3  . calcium carbonate (OS-CAL) 600 MG TABS Take 600 mg by mouth 2 (two) times daily with a meal.      . cholecalciferol (VITAMIN D) 1000 UNITS tablet Take 1,000 Units by mouth daily.    . ferrous sulfate 325 (65 FE) MG tablet Take 325 mg by mouth daily with breakfast.    . furosemide (LASIX) 20 MG tablet TAKE ONE TABLET BY MOUTH DAILY AS NEEDED. 30 tablet 6  . gabapentin (NEURONTIN) 100 MG capsule Take 100 mg by mouth 3 (three) times daily.     . hydrALAZINE (APRESOLINE) 25 MG tablet Take 0.5 tablets (12.5 mg total) by mouth every 8 (eight) hours. 45 tablet  6  . isosorbide mononitrate (IMDUR) 30 MG 24 hr tablet Take 0.5 tablets (15 mg total) by mouth daily. 30 tablet 3  . levothyroxine (SYNTHROID, LEVOTHROID) 125 MCG tablet Take 125 mcg by mouth daily before breakfast.    . loperamide (IMODIUM) 2 MG capsule Take 2-4 mg by mouth 4 (four) times daily as needed for diarrhea or loose stools.    . Magnesium 250 MG TABS Take 250 mg by mouth daily.     . metoprolol succinate (TOPROL-XL) 50 MG 24 hr tablet TAKE 1 TABLET BY MOUTH ONCE DAILY. 30 tablet 6  . pantoprazole (PROTONIX) 40 MG tablet Take 40 mg by mouth 2 (two) times daily.     . polyethylene glycol (MIRALAX / GLYCOLAX) packet Take 17 g by mouth daily as needed (for constipation.).     Marland Kitchen potassium chloride (K-DUR) 10 MEQ tablet Take 1 tablet (10 mEq total) by mouth every other day.    . pravastatin (PRAVACHOL) 10 MG tablet Take 10 mg by mouth daily.    . Probiotic Product (ALIGN PO) Take 1 tablet by mouth daily.     . sitaGLIPtin (JANUVIA) 50 MG tablet Take 50 mg by mouth daily.    . temazepam (RESTORIL) 15 MG capsule Take 15 mg by mouth at bedtime as needed for  sleep.     Alveda Reasons 15 MG TABS tablet TAKE 1 TABLET BY MOUTH ONCE A DAY. 30 tablet 6  . cyclobenzaprine (FLEXERIL) 5 MG tablet Take 1-2 tabs by mouth up to three times daily for muscle spasms. 30 tablet 0   No facility-administered medications prior to visit.      Allergies:   Flecainide; Hydrocodone-acetaminophen; Ibuprofen; Oxycodone hcl; and Penicillins   Social History   Socioeconomic History  . Marital status: Single    Spouse name: Not on file  . Number of children: 0  . Years of education: Not on file  . Highest education level: Not on file  Occupational History  . Occupation: Sales promotion account executive  Social Needs  . Financial resource strain: Not on file  . Food insecurity:    Worry: Not on file    Inability: Not on file  . Transportation needs:    Medical: Not on file    Non-medical: Not on file  Tobacco Use  . Smoking status: Never Smoker  . Smokeless tobacco: Never Used  . Tobacco comment: Never smoked  Substance and Sexual Activity  . Alcohol use: No    Alcohol/week: 0.0 standard drinks  . Drug use: No  . Sexual activity: Never    Birth control/protection: Post-menopausal  Lifestyle  . Physical activity:    Days per week: Not on file    Minutes per session: Not on file  . Stress: Not on file  Relationships  . Social connections:    Talks on phone: Not on file    Gets together: Not on file    Attends religious service: Not on file    Active member of club or organization: Not on file    Attends meetings of clubs or organizations: Not on file    Relationship status: Not on file  Other Topics Concern  . Not on file  Social History Narrative  . Not on file     Family History:  The patient's family history includes Cancer in her father and maternal grandmother; Congestive Heart Failure in her mother; Heart attack in her paternal grandmother; Heart disease in her mother; Hyperlipidemia in her mother; Hypertension in her  mother; Lung cancer in her father; Other in  her brother; Stroke in her mother.   Review of Systems:   Please see the history of present illness.     General:  No chills, fever, night sweats or weight changes.  Cardiovascular:  No chest pain, dyspnea on exertion, orthopnea, palpitations, paroxysmal nocturnal dyspnea. Positive for edema.  Dermatological: No rash, lesions/masses Respiratory: No dyspnea. Positive for dry cough.  Urologic: No hematuria, dysuria Abdominal:   No nausea, vomiting, diarrhea, bright red blood per rectum, melena, or hematemesis Neurologic:  No visual changes, wkns, changes in mental status. All other systems reviewed and are otherwise negative except as noted above.   Physical Exam:    VS:  BP 126/84   Pulse 76   Ht 4' 10.5" (1.486 m)   Wt 146 lb 6.4 oz (66.4 kg)   SpO2 97%   BMI 30.08 kg/m    General: Well developed, well nourished Caucasian female appearing in no acute distress. Head: Normocephalic, atraumatic, sclera non-icteric, no xanthomas, nares are without discharge.  Neck: No carotid bruits. JVD at 8 cm.  Lungs: Respirations regular and unlabored, without wheezing or rales.  Heart: Regular rate and rhythm. No S3 or S4.  No murmur, no rubs, or gallops appreciated. Abdomen: Soft, non-tender, non-distended with normoactive bowel sounds. No hepatomegaly. No rebound/guarding. No obvious abdominal masses. Msk:  Strength and tone appear normal for age. No joint deformities or effusions. Extremities: No clubbing or cyanosis. 1+ pitting edema up to knees bilaterally.  Distal pedal pulses are 2+ bilaterally. Neuro: Alert and oriented X 3. Moves all extremities spontaneously. No focal deficits noted. Psych:  Responds to questions appropriately with a normal affect. Skin: No rashes or lesions noted  Wt Readings from Last 3 Encounters:  05/03/18 146 lb 6.4 oz (66.4 kg)  03/20/18 140 lb (63.5 kg)  02/27/18 142 lb (64.4 kg)     Studies/Labs Reviewed:   EKG:  EKG is not ordered today.   Recent  Labs: 05/09/2017: ALT 55; TSH 1.676 05/10/2017: Magnesium 2.0 06/30/2017: Hemoglobin 9.3; Platelets 258 05/03/2018: B Natriuretic Peptide 894.0; BUN 17; Creatinine, Ser 1.51; Potassium 4.4; Sodium 139   Lipid Panel    Component Value Date/Time   CHOL 78 05/16/2017 0457   TRIG 65 05/16/2017 0457   HDL 43 05/16/2017 0457   CHOLHDL 1.8 05/16/2017 0457   VLDL 13 05/16/2017 0457   LDLCALC 22 05/16/2017 0457    Additional studies/ records that were reviewed today include:   Cardiac Catheterization: 05/15/2017  Prox LAD lesion is 20% stenosed.  Prox RCA lesion is 20% stenosed.  Mid RCA lesion is 20% stenosed.   Nonischemic cardiomyopathy with recent echo documentation of ejection fraction of 25%.  LVEDP 13-14 mmHg.  Moderate right heart pressure elevation with PA pressure 50/18.  No significant coronary obstructive disease with mild 20% narrowing in the LAD between the first and second diagonal vessels and proximal to the initial septal perforating artery and large dominant RCA with 20% proximal and mid stenoses followed by ectasia in the region of the acute margin arch PDA and PLA system.  The patient was in atrial fibrillation during the procedure.  RECOMMENDATION: Medical therapy.  AV Node Ablation: 06/2017 CONCLUSIONS:  1. Successful implantation of a Saint Jude BiV pacemaker for symptomatic complete AV block after AV node ablation for uncontrolled atrial fibrillation in the setting of chronic systolic heart failure, ejection fraction 25%. 2. No early apparent complications.   Echocardiogram: 04/2017 Study Conclusions  -  Left ventricle: The cavity size was normal. Wall thickness was   increased in a pattern of mild LVH. The estimated ejection   fraction was 25%. There is akinesis of the basal-midinferolateral   and inferior myocardium. The study is not technically sufficient   to allow evaluation of LV diastolic function. - Aortic valve: Mildly calcified annulus.  Trileaflet. There was   trivial regurgitation. - Mitral valve: Mildly calcified annulus. Mildly thickened leaflets   . There was mild regurgitation. - Left atrium: The atrium was severely dilated. - Right ventricle: Systolic function was moderately reduced. - Right atrium: The atrium was mildly dilated. Central venous   pressure (est): 3 mm Hg. - Tricuspid valve: There was mild-moderate regurgitation. - Pulmonary arteries: PA peak pressure: 45 mm Hg (S). - Pericardium, extracardiac: There was no pericardial effusion.  Impressions:  - Mild LVH with LVEF approximately 25% and akinesis of the mid to   basal inferior/inferolateral wall. Indeterminate diastolic   function. LVEF has decreased compared to the prior study from   June. Severe left atrial enlargement. Mild mitral annular   calcification with mild mitral regurgitation. Moderate RV   dysfunction. Mild to moderate tricuspid regurgitation with PASP   estimated 45 mmHg.  Assessment:    1. Chronic combined systolic and diastolic heart failure ([place])   2. Non-ischemic cardiomyopathy ([place])   3. Lower extremity edema   4. Persistent atrial fibrillation   5. Current use of long term anticoagulation   6. Essential hypertension   7. Medication management      Plan:   In order of problems listed above:  1. Chronic Combined Systolic and Diastolic CHF/ Nonischemic Cardiomyopathy/ Edema - The patient has a known reduced EF of 25% by echocardiogram in 04/2017 with cardiac catheterization at that time showing nonobstructive CAD. Her cardiomyopathy was thought to be most consistent with a tachycardia etiology given her difficult to control atrial fibrillation at that time. - presents with worsening lower extremity edema and a dry cough over the past few weeks with a 5+ pound weight gain on her home scales. She does have 1+ pitting edema up to her knees on examination. I suspect that her fluid accumulation is due to dietary and medication  noncompliance as outlined above. - Will check a BNP and BMET today. She is listed as taking Lasix 20mg  daily. Will increase to 40mg  daily for 4 days then reduce back to 20mg  daily. Sodium and fluid restriction were reviewed. If volume status remains difficult to control, would recommend a repeat echocardiogram as her most recent imaging was in 2018. - Remains on Toprol-XL 50 mg daily, Hydralazine, and Imdur. No longer on ACE-I/ARB/ARNI given variable kidney function.   2. Persistent Atrial Fibrillation/ Use of Long-Term Anticoagulation - s/p AV Node Ablation in 06/2017 with placement of St. Jude BiV PPM. Followed by Dr. Lovena Le.  - she denies any recent palpitations. Continue Toprol-XL 50 mg daily for rate control. - Denies any evidence of active bleeding. Remains on Xarelto for anticoagulation.  3. HTN - BP is well controlled at 126/84 during today's visit. - Continue current medication regimen.  4. Stage 3 CKD - Creatinine was variable from 1.4 - 1.8 in 06/2017. Will recheck a BMET today. Followed by Dr. Lowanda Foster.     Medication Adjustments/Labs and Tests Ordered: Current medicines are reviewed at length with the patient today.  Concerns regarding medicines are outlined above.  Medication changes, Labs and Tests ordered today are listed in the Patient Instructions below. Patient Instructions  Medication Instructions:  Your physician recommends that you continue on your current medications as directed. Please refer to the Current Medication list given to you today.  Increase Lasix to 40 mg and Potassium to 20 mEq for 4 days. Then resume Lasix at 20 mg and Potassium at 10 mEq.   If you need a refill on your cardiac medications before your next appointment, please call your pharmacy.   Lab work: Your physician recommends that you return for lab work in: Today  If you have labs (blood work) drawn today and your tests are completely normal, you will receive your results only by: Marland Kitchen MyChart  Message (if you have MyChart) OR . A paper copy in the mail If you have any lab test that is abnormal or we need to change your treatment, we will call you to review the results.  Testing/Procedures: NONE   Follow-Up: At [facility], you and your health needs are our priority.  As part of our continuing mission to provide you with exceptional heart care, we have created designated Provider Care Teams.  These Care Teams include your primary Cardiologist (physician) and Advanced Practice Providers (APPs -  Physician Assistants and Nurse Practitioners) who all work together to provide you with the care you need, when you need it. You will need a follow up appointment in 2 months.  Please call our office 2 months in advance to schedule this appointment.  You may see Cristopher Peru, MD or one of the following Advanced Practice Providers on your designated Care Team:   Bernerd Pho, PA-C [facility]) . Ermalinda Barrios, PA-C ([place])  Any Other Special Instructions Will Be Listed Below (If Applicable). Thank you for choosing [facility]!        Signed, Erma Heritage, PA-C  05/03/2018 5:01 PM    [facility] [address] [place], [place] [address] Phone: [phone]

## 2018-05-03 NOTE — Patient Instructions (Signed)
Medication Instructions:  Your physician recommends that you continue on your current medications as directed. Please refer to the Current Medication list given to you today.  Increase Lasix to 40 mg and Potassium to 20 mEq for 4 days. Then resume Lasix at 20 mg and Potassium at 10 mEq.   If you need a refill on your cardiac medications before your next appointment, please call your pharmacy.   Lab work: Your physician recommends that you return for lab work in: Today  If you have labs (blood work) drawn today and your tests are completely normal, you will receive your results only by: Marland Kitchen MyChart Message (if you have MyChart) OR . A paper copy in the mail If you have any lab test that is abnormal or we need to change your treatment, we will call you to review the results.  Testing/Procedures: NONE   Follow-Up: At [facility], you and your health needs are our priority.  As part of our continuing mission to provide you with exceptional heart care, we have created designated Provider Care Teams.  These Care Teams include your primary Cardiologist (physician) and Advanced Practice Providers (APPs -  Physician Assistants and Nurse Practitioners) who all work together to provide you with the care you need, when you need it. You will need a follow up appointment in 2 months.  Please call our office 2 months in advance to schedule this appointment.  You may see Cristopher Peru, MD or one of the following Advanced Practice Providers on your designated Care Team:   Bernerd Pho, PA-C [facility]) . Ermalinda Barrios, PA-C ([place])  Any Other Special Instructions Will Be Listed Below (If Applicable). Thank you for choosing [facility]!

## 2018-05-07 DIAGNOSIS — N183 Chronic kidney disease, stage 3 (moderate): Secondary | ICD-10-CM | POA: Diagnosis not present

## 2018-05-07 DIAGNOSIS — R000 Tachycardia, unspecified: Secondary | ICD-10-CM | POA: Diagnosis not present

## 2018-05-07 DIAGNOSIS — E782 Mixed hyperlipidemia: Secondary | ICD-10-CM | POA: Diagnosis not present

## 2018-05-07 DIAGNOSIS — E1121 Type 2 diabetes mellitus with diabetic nephropathy: Secondary | ICD-10-CM | POA: Diagnosis not present

## 2018-05-07 DIAGNOSIS — K210 Gastro-esophageal reflux disease with esophagitis: Secondary | ICD-10-CM | POA: Diagnosis not present

## 2018-05-07 DIAGNOSIS — I4819 Other persistent atrial fibrillation: Secondary | ICD-10-CM | POA: Diagnosis not present

## 2018-05-07 DIAGNOSIS — I10 Essential (primary) hypertension: Secondary | ICD-10-CM | POA: Diagnosis not present

## 2018-05-07 DIAGNOSIS — E1122 Type 2 diabetes mellitus with diabetic chronic kidney disease: Secondary | ICD-10-CM | POA: Diagnosis not present

## 2018-05-07 DIAGNOSIS — I5040 Unspecified combined systolic (congestive) and diastolic (congestive) heart failure: Secondary | ICD-10-CM | POA: Diagnosis not present

## 2018-05-07 DIAGNOSIS — E039 Hypothyroidism, unspecified: Secondary | ICD-10-CM | POA: Diagnosis not present

## 2018-05-21 DIAGNOSIS — E1122 Type 2 diabetes mellitus with diabetic chronic kidney disease: Secondary | ICD-10-CM | POA: Diagnosis not present

## 2018-05-21 DIAGNOSIS — N184 Chronic kidney disease, stage 4 (severe): Secondary | ICD-10-CM | POA: Diagnosis not present

## 2018-05-21 DIAGNOSIS — D509 Iron deficiency anemia, unspecified: Secondary | ICD-10-CM | POA: Diagnosis not present

## 2018-05-21 DIAGNOSIS — E1121 Type 2 diabetes mellitus with diabetic nephropathy: Secondary | ICD-10-CM | POA: Diagnosis not present

## 2018-05-21 DIAGNOSIS — E782 Mixed hyperlipidemia: Secondary | ICD-10-CM | POA: Diagnosis not present

## 2018-05-21 DIAGNOSIS — I10 Essential (primary) hypertension: Secondary | ICD-10-CM | POA: Diagnosis not present

## 2018-05-21 DIAGNOSIS — E039 Hypothyroidism, unspecified: Secondary | ICD-10-CM | POA: Diagnosis not present

## 2018-05-21 DIAGNOSIS — E559 Vitamin D deficiency, unspecified: Secondary | ICD-10-CM | POA: Diagnosis not present

## 2018-05-28 DIAGNOSIS — G9009 Other idiopathic peripheral autonomic neuropathy: Secondary | ICD-10-CM | POA: Diagnosis not present

## 2018-05-28 DIAGNOSIS — I480 Paroxysmal atrial fibrillation: Secondary | ICD-10-CM | POA: Diagnosis not present

## 2018-05-28 DIAGNOSIS — E039 Hypothyroidism, unspecified: Secondary | ICD-10-CM | POA: Diagnosis not present

## 2018-05-28 DIAGNOSIS — J849 Interstitial pulmonary disease, unspecified: Secondary | ICD-10-CM | POA: Diagnosis not present

## 2018-05-28 DIAGNOSIS — N183 Chronic kidney disease, stage 3 (moderate): Secondary | ICD-10-CM | POA: Diagnosis not present

## 2018-05-28 DIAGNOSIS — E782 Mixed hyperlipidemia: Secondary | ICD-10-CM | POA: Diagnosis not present

## 2018-05-28 DIAGNOSIS — M109 Gout, unspecified: Secondary | ICD-10-CM | POA: Diagnosis not present

## 2018-05-28 DIAGNOSIS — E1121 Type 2 diabetes mellitus with diabetic nephropathy: Secondary | ICD-10-CM | POA: Diagnosis not present

## 2018-05-28 DIAGNOSIS — K210 Gastro-esophageal reflux disease with esophagitis: Secondary | ICD-10-CM | POA: Diagnosis not present

## 2018-05-28 DIAGNOSIS — E1122 Type 2 diabetes mellitus with diabetic chronic kidney disease: Secondary | ICD-10-CM | POA: Diagnosis not present

## 2018-05-28 DIAGNOSIS — D509 Iron deficiency anemia, unspecified: Secondary | ICD-10-CM | POA: Diagnosis not present

## 2018-05-28 DIAGNOSIS — I10 Essential (primary) hypertension: Secondary | ICD-10-CM | POA: Diagnosis not present

## 2018-06-03 ENCOUNTER — Other Ambulatory Visit: Payer: Self-pay | Admitting: *Deleted

## 2018-06-03 NOTE — Patient Outreach (Signed)
[place] [facility]) Care Management  06/03/2018  [name] 1956/05/08 [id number]   RN Health CoachQuarterly Outreach  Referral Date:06/05/17 Referral Source:TOC Calls Reason for Referral:Disease Management Education Insurance:[employer]   Outreach Attempt:  Outreach attempt #1 to patient for quarterly follow up. No answer. RN Health Coach left HIPAA compliant voicemail message along with contact information.  Plan:  RN Health Coach will make another outreach attempt within the month of December.  [place] [phone] [name]

## 2018-06-04 DIAGNOSIS — I10 Essential (primary) hypertension: Secondary | ICD-10-CM | POA: Diagnosis not present

## 2018-06-04 DIAGNOSIS — E782 Mixed hyperlipidemia: Secondary | ICD-10-CM | POA: Diagnosis not present

## 2018-06-10 ENCOUNTER — Ambulatory Visit: Payer: PPO | Admitting: Pulmonary Disease

## 2018-06-13 ENCOUNTER — Other Ambulatory Visit: Payer: Self-pay | Admitting: *Deleted

## 2018-06-13 NOTE — Patient Outreach (Signed)
[place] [facility]) Care Management  06/13/2018  [name] December 07, 1955 [id number]   RN Health CoachQuarterly Outreach  Referral Date:06/05/17 Referral Source:TOC Calls Reason for Referral:Disease Management Education Insurance:[employer]   Outreach Attempt: Outreach attempt #2 to patient for quarterly follow up. No answer. RN Health Coach left HIPAA compliant voicemail message along with contact information.  Plan:  RN Health Coach will make another outreach attempt within the month of January.  [place] [phone] [name]

## 2018-07-08 ENCOUNTER — Other Ambulatory Visit: Payer: Self-pay | Admitting: *Deleted

## 2018-07-08 ENCOUNTER — Other Ambulatory Visit: Payer: Self-pay | Admitting: Internal Medicine

## 2018-07-08 NOTE — Patient Outreach (Signed)
[place] [facility]) Care Management  07/08/2018  [name] 1956-03-26 [id number]   [name]  Referral Date:06/05/17 Referral Source:TOC Calls Reason for Referral:Disease Management Education Insurance:[employer]   Outreach Attempt:  Outreach attempt #3 to patient for quarterly follow up.  Patient answered and stated she was on her way out the door to go to medical appointment.  Requesting telephone call back.   Plan:  RN Health Coach will make another outreach to patient with in the month of February if no return call back from patient.  [place] [phone] [name]

## 2018-07-09 ENCOUNTER — Ambulatory Visit: Payer: PPO | Admitting: Pulmonary Disease

## 2018-07-10 ENCOUNTER — Encounter: Payer: Self-pay | Admitting: Internal Medicine

## 2018-07-10 ENCOUNTER — Ambulatory Visit (INDEPENDENT_AMBULATORY_CARE_PROVIDER_SITE_OTHER): Payer: PPO | Admitting: Internal Medicine

## 2018-07-10 VITALS — BP 122/84 | HR 70 | Ht 58.5 in | Wt 153.2 lb

## 2018-07-10 DIAGNOSIS — I4819 Other persistent atrial fibrillation: Secondary | ICD-10-CM | POA: Diagnosis not present

## 2018-07-10 DIAGNOSIS — I5042 Chronic combined systolic (congestive) and diastolic (congestive) heart failure: Secondary | ICD-10-CM | POA: Diagnosis not present

## 2018-07-10 DIAGNOSIS — I428 Other cardiomyopathies: Secondary | ICD-10-CM | POA: Diagnosis not present

## 2018-07-10 MED ORDER — FUROSEMIDE 40 MG PO TABS: 40.0000 mg | ORAL_TABLET | Freq: Every day | ORAL | 11 refills | Status: DC

## 2018-07-10 NOTE — Progress Notes (Signed)
HPI [name] returns today for followup. She is a pleasant 63 yo woman with a h/o atrial fib, amio lung, acute systolic heart failure, and HTN. She notes some worsening dypsnea with exertion. No anginal symptoms. No edema. She admits to some dietary indiscretion with sodium. Her weight is up almost 15 lbs. Allergies  Allergen Reactions  . Flecainide Nausea Only and Other (See Comments)    Faint feeling  . Hydrocodone-Acetaminophen Nausea Only and Other (See Comments)    Severe headache  . Ibuprofen Other (See Comments)    Kidney dysfunction  . Oxycodone Hcl Nausea Only and Other (See Comments)    Headache  . Penicillins Nausea Only and Other (See Comments)    Severe headache Has patient had a PCN reaction causing immediate rash, facial/tongue/throat swelling, SOB or lightheadedness with hypotension: No Has patient had a PCN reaction causing severe rash involving mucus membranes or skin necrosis: No Has patient had a PCN reaction that required hospitalization: No Has patient had a PCN reaction occurring within the last 10 years: No If all of the above answers are "NO", then may proceed with Cephalosporin use.      Current Outpatient Medications  Medication Sig Dispense Refill  . acetaminophen (TYLENOL) 500 MG tablet Take 1,000 mg by mouth every 6 (six) hours as needed (for pain.).     Marland Kitchen albuterol (PROVENTIL HFA;VENTOLIN HFA) 108 (90 Base) MCG/ACT inhaler Inhale 2 puffs into the lungs every 6 (six) hours as needed for wheezing or shortness of breath. 1 Inhaler 3  . calcium carbonate (OS-CAL) 600 MG TABS Take 600 mg by mouth 2 (two) times daily with a meal.      . cholecalciferol (VITAMIN D) 1000 UNITS tablet Take 1,000 Units by mouth daily.    . ferrous sulfate 325 (65 FE) MG tablet Take 325 mg by mouth daily with breakfast.    . furosemide (LASIX) 20 MG tablet TAKE ONE TABLET BY MOUTH DAILY AS NEEDED. 30 tablet 6  . gabapentin (NEURONTIN) 100 MG capsule Take 100 mg by mouth  3 (three) times daily.     . hydrALAZINE (APRESOLINE) 25 MG tablet Take 0.5 tablets (12.5 mg total) by mouth every 8 (eight) hours. 45 tablet 6  . isosorbide mononitrate (IMDUR) 30 MG 24 hr tablet Take 0.5 tablets (15 mg total) by mouth daily. 30 tablet 3  . levothyroxine (SYNTHROID, LEVOTHROID) 125 MCG tablet Take 125 mcg by mouth daily before breakfast.    . loperamide (IMODIUM) 2 MG capsule Take 2-4 mg by mouth 4 (four) times daily as needed for diarrhea or loose stools.    . Magnesium 250 MG TABS Take 250 mg by mouth daily.     . metoprolol succinate (TOPROL-XL) 50 MG 24 hr tablet TAKE 1 TABLET BY MOUTH ONCE DAILY. 30 tablet 10  . pantoprazole (PROTONIX) 40 MG tablet Take 40 mg by mouth 2 (two) times daily.     . polyethylene glycol (MIRALAX / GLYCOLAX) packet Take 17 g by mouth daily as needed (for constipation.).     Marland Kitchen potassium chloride (K-DUR) 10 MEQ tablet Take 1 tablet (10 mEq total) by mouth every other day.    . pravastatin (PRAVACHOL) 10 MG tablet Take 10 mg by mouth daily.    . Probiotic Product (ALIGN PO) Take 1 tablet by mouth daily.     . sitaGLIPtin (JANUVIA) 50 MG tablet Take 50 mg by mouth daily.    . temazepam (RESTORIL) 15 MG capsule Take  15 mg by mouth at bedtime as needed for sleep.     Alveda Reasons 15 MG TABS tablet TAKE 1 TABLET BY MOUTH ONCE A DAY. 30 tablet 6   No current facility-administered medications for this visit.      Past Medical History:  Diagnosis Date  . Amiodarone toxicity   . Anemia   . Chronic bronchitis   . Chronic systolic CHF (congestive heart failure) ([place])    a. 04/2017 Echo: EF 25%.  . Congenital heart disease   . Diabetes mellitus without complication ([place])    Type II  . Diverticulosis   . Fall due to stumbling October 08, 2013   Due to shoes  . GERD (gastroesophageal reflux disease)   . Gout   . Hemorrhoids   . Hyperlipidemia   . Hyperparathyroidism   . Hypertension   . Hypothyroidism   . IDA (iron deficiency anemia)   .  Microscopic colitis 9/11   Colonoscopy  . NICM (nonischemic cardiomyopathy) - tachycardia induced in the setting of AFib ([place])    a. 04/2017 Echo: EF 25%, basal-mid inferolateral, inf AK, triv AI, mild MR, sev dil LA, mod red RV fxn, mildly dil RA, mild to mod TR, PASP 70mmHg.  . Non-obstructive CAD (coronary artery disease)    a. 04/2017 Cath: LM 20p, LCX nl, RCA 20p/m.  Marland Kitchen Paroxysmal SVT (supraventricular tachycardia) ([place])   . Persistent atrial fibrillation 06/2013   a. 04/2017 Admit with CHF/NICM/AFib -> CHA2DS2VASc = 4-->Xarelto 15 mg daily started; b. 06/2017 s/p BiV PPM (SJM ser # [id number]) and AVN ablation.  . Presence of permanent cardiac pacemaker 06/29/2017   Biventricular pacemaker placed  . PVD (peripheral vascular disease) ([place]) 09/2004   Right common femoral endarterectomy in April of 2007  . Renal insufficiency   . Schatzki's ring    Last EGD with esophageal dilatation 74F  9/11  . Scoliosis   . Varicose veins right leg pain and swelling    ROS:   All systems reviewed and negative except as noted in the HPI.   Past Surgical History:  Procedure Laterality Date  . ASD REPAIR  Age 23   [facility]  . AV NODE ABLATION N/A 06/29/2017   Procedure: AV NODE ABLATION;  Surgeon: Evans Lance, MD;  Location: [place] [facility];  Service: Cardiovascular;  Laterality: N/A;  . BIOPSY  02/13/2017   Procedure: BIOPSY;  Surgeon: Daneil Dolin, MD;  Location[facility];  Service: Endoscopy;;  duodenum gastric  . BIV PACEMAKER INSERTION CRT-P N/A 06/29/2017   Procedure: BIV PACEMAKER INSERTION CRT-P;  Surgeon: Evans Lance, MD;  Location: [place] [facility];  Service: Cardiovascular;  Laterality: N/A;  . CARDIOVERSION N/A 11/13/2013   Procedure: CARDIOVERSION;  Surgeon: Herminio Commons, MD;  Location: [facility] OR[facility];  Service: Endoscopy;  Laterality: N/A;  . COLONOSCOPY  03/04/2010   anal canal hemorrhoids otherwise normal. TI normal. Bx showed  lymphocytic colitis. next TCS 02/2020  . COLONOSCOPY  April 2008   [place]: Pancolonic diverticulosis, external hemorrhoids  . COLONOSCOPY N/A 11/30/2015   RMR: Normal terminal ileum for 10 cm, nonbleeding grade 1 internal hemorrhoids, colonic diverticulosis. Next colonoscopy in 2027.  Marland Kitchen ELAS  06-01-11   Right saphenous ELAS   . ESOPHAGEAL DILATION N/A 05/28/2015   Procedure: ESOPHAGEAL DILATION;  Surgeon: Daneil Dolin, MD;  Location[facility];  Service: Endoscopy;  Laterality: N/A;  . ESOPHAGOGASTRODUODENOSCOPY  03/04/2010   noncritical appearing Schatzki  ring. SB bx negative  . ESOPHAGOGASTRODUODENOSCOPY  05/2008   erosive reflux esophagatitis, noncritical Schatzki ring  . ESOPHAGOGASTRODUODENOSCOPY N/A 05/28/2015   GLO:VFIEPPIR esophagus somewhat baggy, likely due to underlying esophageal motility disorder/small HH   . ESOPHAGOGASTRODUODENOSCOPY N/A 02/13/2017   Dr. Gala Romney: subtly abnormal stomach of doubtful clinical significant s/p biopsy, normal duodenum s/p biopsy, fundic gland polyp on path, normal duodenal biopsy  . [place]  2010  . Left hemithyroidectomy    . Left parathyroidectomy  2007  . Parathyroid adenoma  2007  . Right common femoral endarterectomy  2007  . RIGHT/LEFT HEART CATH AND CORONARY ANGIOGRAPHY N/A 05/15/2017   Procedure: RIGHT/LEFT HEART CATH AND CORONARY ANGIOGRAPHY;  Surgeon: Troy Sine, MD;  Location: [place] [facility];  Service: Cardiovascular;  Laterality: N/A;  . Small bowel capsule  12 2009   Mid to distal small bowel with edema, erosions, tiny ulceration felt to be NSAID related  . TONSILLECTOMY       Family History  Problem Relation Age of Onset  . Stroke Mother   . Heart disease Mother        CHF  - Amputation-Right Leg  . Hyperlipidemia Mother   . Hypertension Mother   . Congestive Heart Failure Mother   . Lung cancer Father   . Cancer Father        Lung  . Other Brother        back problems  . Heart attack Paternal  Grandmother   . Cancer Maternal Grandmother        uterine     Social History   Socioeconomic History  . Marital status: Single    Spouse name: Not on file  . Number of children: 0  . Years of education: Not on file  . Highest education level: Not on file  Occupational History  . Occupation: Sales promotion account executive  Social Needs  . Financial resource strain: Not on file  . Food insecurity:    Worry: Not on file    Inability: Not on file  . Transportation needs:    Medical: Not on file    Non-medical: Not on file  Tobacco Use  . Smoking status: Never Smoker  . Smokeless tobacco: Never Used  . Tobacco comment: Never smoked  Substance and Sexual Activity  . Alcohol use: No    Alcohol/week: 0.0 standard drinks  . Drug use: No  . Sexual activity: Never    Birth control/protection: Post-menopausal  Lifestyle  . Physical activity:    Days per week: Not on file    Minutes per session: Not on file  . Stress: Not on file  Relationships  . Social connections:    Talks on phone: Not on file    Gets together: Not on file    Attends religious service: Not on file    Active member of club or organization: Not on file    Attends meetings of clubs or organizations: Not on file    Relationship status: Not on file  . Intimate partner violence:    Fear of current or ex partner: Not on file    Emotionally abused: Not on file    Physically abused: Not on file    Forced sexual activity: Not on file  Other Topics Concern  . Not on file  Social History Narrative  . Not on file     BP 122/84   Pulse 70   Ht 4' 10.5" (1.486 m)   Wt 153 lb 3.2  oz (69.5 kg)   SpO2 96%   BMI 31.47 kg/m   Physical Exam:  stable appearing 63 yo woman, NAD HEENT: Unremarkable Neck:  6 cm JVD, no thyromegally Lymphatics:  No adenopathy Back:  No CVA tenderness Lungs:  Clear HEART:  Regular rate rhythm, no murmurs, no rubs, no clicks Abd:  soft, positive bowel sounds, no organomegally, no rebound, no  guarding Ext:  2 plus pulses, no edema, no cyanosis, no clubbing Skin:  No rashes no nodules Neuro:  CN II through XII intact, motor grossly intact   DEVICE  Normal device function.  See PaceArt for details.   Assess/Plan: 1. Atrial fib - her rates are controlled. She will continue her blood thinner 2. Chronic systolic heart failure - her symptoms have worsened. I have asked her to increase her lasix to 40 mg daily. I have asked her to reduce her salt intake. 3. PPM - her [employer] biv ppm is working normally. 4. HTN - her blood pressure is controlled. We will follow.  Mikle Bosworth.D.

## 2018-07-10 NOTE — Patient Instructions (Signed)
Medication Instructions:  Your physician has recommended you make the following change in your medication:  Take Lasix 40 mg Daily   If you need a refill on your cardiac medications before your next appointment, please call your pharmacy.   Lab work: NONE   If you have labs (blood work) drawn today and your tests are completely normal, you will receive your results only by: Marland Kitchen MyChart Message (if you have MyChart) OR . A paper copy in the mail If you have any lab test that is abnormal or we need to change your treatment, we will call you to review the results.  Testing/Procedures: NONE   Follow-Up: At [facility], you and your health needs are our priority.  As part of our continuing mission to provide you with exceptional heart care, we have created designated Provider Care Teams.  These Care Teams include your primary Cardiologist (physician) and Advanced Practice Providers (APPs -  Physician Assistants and Nurse Practitioners) who all work together to provide you with the care you need, when you need it. You will need a follow up appointment in 1 years.  Please call our office 2 months in advance to schedule this appointment.  You may see Dr. Lovena Le or one of the following Advanced Practice Providers on your designated Care Team:   Chanetta Marshall, NP . Tommye Standard, PA-C  Any Other Special Instructions Will Be Listed Below (If Applicable). Thank you for choosing [facility]!

## 2018-07-17 ENCOUNTER — Other Ambulatory Visit: Payer: Self-pay | Admitting: *Deleted

## 2018-07-17 DIAGNOSIS — J849 Interstitial pulmonary disease, unspecified: Secondary | ICD-10-CM

## 2018-07-18 ENCOUNTER — Other Ambulatory Visit: Payer: Self-pay | Admitting: Pulmonary Disease

## 2018-07-18 ENCOUNTER — Ambulatory Visit (INDEPENDENT_AMBULATORY_CARE_PROVIDER_SITE_OTHER)
Admission: RE | Admit: 2018-07-18 | Discharge: 2018-07-18 | Disposition: A | Discharge status: Home / Self Care | Payer: PPO | Source: Ambulatory Visit | Attending: Pulmonary Disease | Admitting: Pulmonary Disease

## 2018-07-18 ENCOUNTER — Encounter: Payer: Self-pay | Admitting: Pulmonary Disease

## 2018-07-18 ENCOUNTER — Ambulatory Visit (INDEPENDENT_AMBULATORY_CARE_PROVIDER_SITE_OTHER): Payer: PPO | Admitting: Pulmonary Disease

## 2018-07-18 DIAGNOSIS — G4733 Obstructive sleep apnea (adult) (pediatric): Secondary | ICD-10-CM | POA: Diagnosis not present

## 2018-07-18 DIAGNOSIS — J90 Pleural effusion, not elsewhere classified: Secondary | ICD-10-CM | POA: Diagnosis not present

## 2018-07-18 DIAGNOSIS — J849 Interstitial pulmonary disease, unspecified: Secondary | ICD-10-CM | POA: Diagnosis not present

## 2018-07-18 LAB — PULMONARY FUNCTION TEST
DL/VA % pred: 85 %
DL/VA: 3.33 ml/min/mmHg/L
DLCO UNC % PRED: 48 %
DLCO unc: 7.79 ml/min/mmHg
FEF 25-75 PRE: 1.02 L/s
FEF2575-%Pred-Pre: 52 %
FEV1-%PRED-PRE: 58 %
FEV1-PRE: 1.14 L
FEV1FVC-%Pred-Pre: 103 %
FEV6-%PRED-PRE: 57 %
FEV6-Pre: 1.42 L
FEV6FVC-%PRED-PRE: 104 %
FVC-%Pred-Pre: 55 %
FVC-Pre: 1.42 L
PRE FEV1/FVC RATIO: 80 %
Pre FEV6/FVC Ratio: 100 %

## 2018-07-18 NOTE — Patient Instructions (Signed)
CXR today Refills on albuterol MDI

## 2018-07-18 NOTE — Progress Notes (Signed)
Spirometry and Dlco done today. 

## 2018-07-18 NOTE — Progress Notes (Signed)
   Subjective:    Patient ID: [name], female    DOB: 1955-10-25, 63 y.o.   MRN: [id number]  HPI   63 year old  never smoker seen 11/21/2016 for cough and shortness of breath felt to have a component of amiodarone lung toxicity   PMH significant for Chronic A Fib status post ablation and PPM , CHF /tachycardia mediated cardiomyopathy  Mild OSA w/ AHI 7/hr , CPAP intolerant  Chief Complaint  Patient presents with  . Follow-up    PFT results. Has had problems with collecting fluid.    Dyspneic on  Exertion Baseline weight is 139 pounds, weight had increased as high as 154 pounds but now is back down to 145.  On Lasix 40 mg daily, denies orthopnea. She has occasional wheezing for which she uses Breo.  Albuterol has fallen off her list for some reason.  She is trying to watch the sodium in her diet  Occasional dry cough  Significant tests/ events reviewed  Echo June 2018, EF 60%,RV6 diastolic dysfunction,severely dilated left atrium moderately dilated. Right atrium, pulmonary pressure 52 mmHg.  Marland Kitchen 04/2017 Echo showed EF at 25%. Cath showed nonobstructive CAD . RHC showed mild elevated PAP     PFT 11/2016 FVC 64%, DLCO 61% PFT 04/2017 shows stable lung function . FEV1 67% , ratio 85, FVC 60%. + BD response, DLCO 56%.  PFT 06/2018 FVC 55%, ratio 80, DLCO 48%  05/2017 HR CT chest shows small right effusion and marked improvement in groundglass opacities bilaterally, no ILD  Review of Systems neg for any significant sore throat, dysphagia, itching, sneezing, nasal congestion or excess/ purulent secretions, fever, chills, sweats, unintended wt loss, pleuritic or exertional cp, hempoptysis, orthopnea pnd or change in chronic leg swelling. Also denies presyncope, palpitations, heartburn, abdominal pain, nausea, vomiting, diarrhea or change in bowel or urinary habits, dysuria,hematuria, rash, arthralgias, visual complaints, headache, numbness weakness or ataxia.       Objective:   Physical Exam  Gen. Pleasant, well-nourished, in no distress ENT - no thrush, no pallor/icterus,no post nasal drip Neck: No JVD, no thyromegaly, no carotid bruits Lungs: no use of accessory muscles, no dullness to percussion, clear without rales , faint scattered rhonchi  Cardiovascular: Rhythm regular, heart sounds  normal, no murmurs or gallops, 2+  peripheral edema Musculoskeletal: No deformities, no cyanosis or clubbing        Assessment & Plan:

## 2018-07-18 NOTE — Assessment & Plan Note (Addendum)
No evidence of ILD on last HRCT 05/2017 and infiltrates seem to have been all related to fluid. She continues to have pedal edema and is still about her dry weight and needs to lose a few more pounds of fluid weight This probably explains her dyspnea and intermittent wheezing We will provide her with albuterol to use as needed  Obtain chest x-ray today

## 2018-07-18 NOTE — Addendum Note (Signed)
Addended by: Valerie Salts on: 07/18/2018 02:02 PM   Modules accepted: Orders

## 2018-07-18 NOTE — Assessment & Plan Note (Signed)
Intolerant of CPAP in the past and will not push this anymore

## 2018-07-19 ENCOUNTER — Telehealth: Payer: Self-pay | Admitting: Pulmonary Disease

## 2018-07-19 NOTE — Telephone Encounter (Signed)
Advised pt of results. Pt understood and nothing further is needed.      Notes recorded by Rigoberto Noel, MD on 07/19/2018 at 9:38 AM EST Small amount of fluid on the right, otherwise unchanged from prior

## 2018-07-30 ENCOUNTER — Other Ambulatory Visit: Payer: Self-pay | Admitting: *Deleted

## 2018-07-30 ENCOUNTER — Encounter: Payer: Self-pay | Admitting: *Deleted

## 2018-07-30 NOTE — Patient Outreach (Signed)
[place] [facility] [facility]) Care Management  07/30/2018  [name] 05/05/1956 [id number]   [place]  Referral Date:06/05/17 Referral Source:TOC Calls Reason for Referral:Disease Management Education Insurance:[employer] Advantage   Outreach Attempt:  Outreach attempt #4 to patient for quarterly follow up. No answer. RN Health Coach left HIPAA compliant voicemail message along with contact information.  Plan:  RN Health Coach will make another outreach attempt within the month of January if no return call back from patient.  [place] [phone] [email or web address]

## 2018-07-30 NOTE — Patient Outreach (Signed)
[place] [facility]) Care Management  [place]  07/30/2018   [name] 1955-08-07 [id number]   RN Health Coach Quarterly Outreach   Referral Date:  06/05/17 Referral Source:  [facility] Calls Reason for Referral:  Disease Management Education Insurance:   Health Team Advantage   Outreach Attempt:  Received telephone call back from patient.  HIPAA verified with patient.  Patient stating she feels better.  Reporting her lasix dose was increased by Dr. Lovena Le in January due to weight gain (154 pounds) and heart failure.  Has not weighed at home.  Last weight was at pulmonologist appointment in January (145 pounds).  Baseline weight from a few months ago was 139 pounds.  Stating she forgets to weigh herself in the mornings.  Discussed importance of daily weight monitoring and when to call the physician based on weights.  Requested patient weigh herself while on telephone with [place].  Patient weighed and gave reading of 149 pounds.  Endorsing continued swelling in her ankles.  Fasting blood sugar this morning was 129 with fasting ranges 100-110's.  Last Hgb A1C was 5.8 on 05/21/2018.  Encounter Medications:  Outpatient Encounter Medications as of 07/30/2018  Medication Sig Note  . acetaminophen (TYLENOL) 500 MG tablet Take 1,000 mg by mouth every 6 (six) hours as needed (for pain.).    Marland Kitchen calcium carbonate (OS-CAL) 600 MG TABS Take 600 mg by mouth 2 (two) times daily with a meal.   02/01/2018: Patient reports taking once a day per primary care providers recommendation  . cholecalciferol (VITAMIN D) 1000 UNITS tablet Take 1,000 Units by mouth daily.   . furosemide (LASIX) 40 MG tablet Take 1 tablet (40 mg total) by mouth daily.   Marland Kitchen gabapentin (NEURONTIN) 100 MG capsule Take 100 mg by mouth 3 (three) times daily.    . hydrALAZINE (APRESOLINE) 25 MG tablet Take 0.5 tablets (12.5 mg total) by mouth every 8 (eight) hours.   . isosorbide mononitrate (IMDUR) 30 MG 24 hr tablet  Take 0.5 tablets (15 mg total) by mouth daily.   Marland Kitchen levothyroxine (SYNTHROID, LEVOTHROID) 125 MCG tablet Take 100 mcg by mouth daily before breakfast.    . loperamide (IMODIUM) 2 MG capsule Take 2-4 mg by mouth 4 (four) times daily as needed for diarrhea or loose stools.   . Magnesium 250 MG TABS Take 250 mg by mouth daily.    . metoprolol succinate (TOPROL-XL) 50 MG 24 hr tablet TAKE 1 TABLET BY MOUTH ONCE DAILY.   . pantoprazole (PROTONIX) 40 MG tablet Take 40 mg by mouth 2 (two) times daily.    . polyethylene glycol (MIRALAX / GLYCOLAX) packet Take 17 g by mouth daily as needed (for constipation.).    Marland Kitchen potassium chloride (K-DUR) 10 MEQ tablet Take 1 tablet (10 mEq total) by mouth every other day. 01/10/2018: Reports taking every day  . pravastatin (PRAVACHOL) 10 MG tablet Take 10 mg by mouth daily.   Marland Kitchen PROAIR HFA 108 (90 Base) MCG/ACT inhaler INHALE 2 PUFFS EVERY 6 HOURS AS NEEDED FOR SHORTNESS OF BREATH AND WHEEZING.   . Probiotic Product (ALIGN PO) Take 1 tablet by mouth daily.    . sitaGLIPtin (JANUVIA) 50 MG tablet Take 50 mg by mouth daily.   . temazepam (RESTORIL) 15 MG capsule Take 15 mg by mouth at bedtime as needed for sleep.    Alveda Reasons 15 MG TABS tablet TAKE 1 TABLET BY MOUTH ONCE A DAY.    No facility-administered encounter medications  on file as of 07/30/2018.     Functional Status:  In your present state of health, do you have any difficulty performing the following activities: 07/30/2018  Hearing? Y  Comment total hearing loss in left ear and wears bilateral hearing aides  Vision? N  Difficulty concentrating or making decisions? N  Walking or climbing stairs? Y  Comment trouble climbing stairs  Dressing or bathing? N  Doing errands, shopping? N  Preparing Food and eating ? N  Using the Toilet? N  In the past six months, have you accidently leaked urine? Y  Do you have problems with loss of bowel control? Y  Managing your Medications? N  Managing your Finances? N   Housekeeping or managing your Housekeeping? N  Some recent data might be hidden    Fall/Depression Screening: Fall Risk  07/30/2018 02/01/2018 01/10/2018  Falls in the past year? 1 Yes Yes  Comment - patient reporting a fall on July 4th no falls in the past 3 months  Number falls in past yr: 0 2 or more -  Comment - - -  Injury with Fall? 0 No No  Risk Factor Category  Moderate Risk (1 Point) High Fall Risk -  Risk for fall due to : History of fall(s);Impaired vision;Impaired balance/gait;Medication side effect;Impaired mobility History of fall(s);Impaired balance/gait;Impaired mobility;Medication side effect History of fall(s);Impaired balance/gait;Medication side effect;Impaired mobility;Impaired vision  Follow up Falls evaluation completed;Education provided;Falls prevention discussed Falls prevention discussed;Education provided Falls prevention discussed;Education provided   [facility] 2/9 Scores 07/30/2018 06/27/2017 05/21/2017 12/05/2016  PHQ - 2 Score 0 1 0 0   THN CM Care Plan Problem One     Most Recent Value  Care Plan Problem One  Knowledge deficiet related to self cae management of congestive heart failure  Role Documenting the Problem One  [place] for Problem One  Active  [facility] Long Term Goal   Patient will report scheduling Cardiology heart failure appointment follow up in the next 60 days.  THN Long Term Goal Start Date  07/30/18  THN Long Term Goal Met Date  07/30/18  Interventions for Problem One Long Term Goal  Discussed and reviewed current care plan and goals with patient, encouraged to keep and attend scheduled medical appointments, discussed importance of heart failure follow up after increase in fluid medication and encouraged to contact Cardiology for follow up appointment, reviewed medications and encouraged medication compliance, reviewed heart failure action plan and zones with patient, encouraged patient to find heart failure zone education and place on  refrigerator for easy reference  THN CM Short Term Goal #1   Patient will report weighing herself daily in the next 30 days.  THN CM Short Term Goal #1 Start Date  07/30/18  [facility] CM Short Term Goal #1 Met Date  07/30/18  Interventions for Short Term Goal #1  Discussed importance of daily weights and when to call physician based on weight, discussed when to weigh every day (first thing in the morning), reviewed signs and symptoms of congestive heart failure, using teach back method had patient verbalize heart failure action plan     Appointments:  Reports last seeing primary care provider, Dr. Nevada Crane on 04/19/2018 and has scheduled wellness visit on 09/02/2018.  Saw Dr. Lovena Le with EP on 07/10/2018 and Bernerd Pho PA with Cardiology on 05/03/2018.  Patient encouraged to contact Cardiology for heart failure follow up with continued weight gain and continued swelling after increase in fluid medication.  Saw, Dr. Elsworth Soho  with Pulmonology on 07/18/2018.  Plan: RN Health Coach will send primary care provider Quarterly Update. RN Health Coach will make next telephone outreach to patient within the month of March.  [place] [phone] [email or web address]

## 2018-08-22 DIAGNOSIS — L918 Other hypertrophic disorders of the skin: Secondary | ICD-10-CM | POA: Diagnosis not present

## 2018-08-22 DIAGNOSIS — L821 Other seborrheic keratosis: Secondary | ICD-10-CM | POA: Diagnosis not present

## 2018-08-29 ENCOUNTER — Other Ambulatory Visit: Payer: Self-pay | Admitting: Internal Medicine

## 2018-09-02 DIAGNOSIS — E1121 Type 2 diabetes mellitus with diabetic nephropathy: Secondary | ICD-10-CM | POA: Diagnosis not present

## 2018-09-02 DIAGNOSIS — M109 Gout, unspecified: Secondary | ICD-10-CM | POA: Diagnosis not present

## 2018-09-02 DIAGNOSIS — E1122 Type 2 diabetes mellitus with diabetic chronic kidney disease: Secondary | ICD-10-CM | POA: Diagnosis not present

## 2018-09-02 DIAGNOSIS — I10 Essential (primary) hypertension: Secondary | ICD-10-CM | POA: Diagnosis not present

## 2018-09-02 DIAGNOSIS — N183 Chronic kidney disease, stage 3 (moderate): Secondary | ICD-10-CM | POA: Diagnosis not present

## 2018-09-02 DIAGNOSIS — R000 Tachycardia, unspecified: Secondary | ICD-10-CM | POA: Diagnosis not present

## 2018-09-02 DIAGNOSIS — K210 Gastro-esophageal reflux disease with esophagitis: Secondary | ICD-10-CM | POA: Diagnosis not present

## 2018-09-02 DIAGNOSIS — E782 Mixed hyperlipidemia: Secondary | ICD-10-CM | POA: Diagnosis not present

## 2018-09-02 DIAGNOSIS — E039 Hypothyroidism, unspecified: Secondary | ICD-10-CM | POA: Diagnosis not present

## 2018-09-02 DIAGNOSIS — N184 Chronic kidney disease, stage 4 (severe): Secondary | ICD-10-CM | POA: Diagnosis not present

## 2018-09-02 DIAGNOSIS — I4819 Other persistent atrial fibrillation: Secondary | ICD-10-CM | POA: Diagnosis not present

## 2018-09-02 DIAGNOSIS — I5042 Chronic combined systolic (congestive) and diastolic (congestive) heart failure: Secondary | ICD-10-CM | POA: Diagnosis not present

## 2018-09-09 DIAGNOSIS — R944 Abnormal results of kidney function studies: Secondary | ICD-10-CM | POA: Diagnosis not present

## 2018-09-09 DIAGNOSIS — G5601 Carpal tunnel syndrome, right upper limb: Secondary | ICD-10-CM | POA: Diagnosis not present

## 2018-09-09 DIAGNOSIS — M25531 Pain in right wrist: Secondary | ICD-10-CM | POA: Diagnosis not present

## 2018-09-16 ENCOUNTER — Other Ambulatory Visit: Payer: Self-pay | Admitting: *Deleted

## 2018-09-16 NOTE — Patient Outreach (Signed)
[facility]) Care Management  09/16/2018  RAENA PAU 27-Aug-1955 [id number]   RN Health [facility]  Referral Date:06/05/17 Referral Source:TOC Calls Reason for Referral:Disease Management Education Insurance:Health [employer] Ad[employer]age   Outreach Attempt:  Outreach attempt #1 to patient for monthly follow up. No answer and unable to leave voicemail message due to voicemail did not engage.  Plan:  RN Health Coach will make another outreach attempt within the month of April.  [place] [phone] [email or web address]

## 2018-09-25 ENCOUNTER — Other Ambulatory Visit: Payer: Self-pay | Admitting: *Deleted

## 2018-09-25 NOTE — Patient Outreach (Signed)
[place] [facility]) Care Management  09/25/2018  [name] [birth year] [id number]   [place]  Referral Date:06/05/17 Referral Source:TOC Calls Reason for Referral:Disease Management Education Insurance:[employer]   Outreach Attempt:  Outreach attempt #2 to patient for follow up. No answer. RN Health Coach left HIPAA compliant voicemail message along with contact information.  Plan:  RN Health Coach will make another outreach attempt within the month of May.  [place] [phone] [name]

## 2018-10-09 ENCOUNTER — Ambulatory Visit (INDEPENDENT_AMBULATORY_CARE_PROVIDER_SITE_OTHER): Payer: PPO | Admitting: *Deleted

## 2018-10-09 ENCOUNTER — Other Ambulatory Visit: Payer: Self-pay

## 2018-10-09 DIAGNOSIS — I4819 Other persistent atrial fibrillation: Secondary | ICD-10-CM | POA: Diagnosis not present

## 2018-10-09 DIAGNOSIS — I428 Other cardiomyopathies: Secondary | ICD-10-CM

## 2018-10-10 ENCOUNTER — Telehealth: Payer: Self-pay

## 2018-10-10 LAB — CUP PACEART REMOTE DEVICE CHECK
Battery Remaining Longevity: 104 mo
Battery Remaining Percentage: 95.5 %
Battery Voltage: 2.99 V
Brady Statistic RV Percent Paced: 98 %
Date Time Interrogation Session: 20200416103602
Implantable Lead Implant Date: 20190104
Implantable Lead Implant Date: 20190104
Implantable Lead Location: 753858
Implantable Lead Location: 753860
Implantable Pulse Generator Implant Date: 20190104
Lead Channel Impedance Value: 1375 Ohm
Lead Channel Impedance Value: 540 Ohm
Lead Channel Pacing Threshold Amplitude: 1.125 V
Lead Channel Pacing Threshold Pulse Width: 0.5 ms
Lead Channel Sensing Intrinsic Amplitude: 12 mV
Lead Channel Setting Pacing Amplitude: 2.125
Lead Channel Setting Pacing Amplitude: 2.5 V
Lead Channel Setting Pacing Pulse Width: 0.5 ms
Lead Channel Setting Pacing Pulse Width: 0.5 ms
Lead Channel Setting Sensing Sensitivity: 4 mV
Pulse Gen Model: 3262
Pulse Gen Serial Number: 8931822

## 2018-10-10 NOTE — Telephone Encounter (Signed)
Left message for patient to remind of missed remote transmission.  

## 2018-10-11 DIAGNOSIS — Z0000 Encounter for general adult medical examination without abnormal findings: Secondary | ICD-10-CM | POA: Diagnosis not present

## 2018-10-15 DIAGNOSIS — R000 Tachycardia, unspecified: Secondary | ICD-10-CM | POA: Diagnosis not present

## 2018-10-15 DIAGNOSIS — I5042 Chronic combined systolic (congestive) and diastolic (congestive) heart failure: Secondary | ICD-10-CM | POA: Diagnosis not present

## 2018-10-15 DIAGNOSIS — N184 Chronic kidney disease, stage 4 (severe): Secondary | ICD-10-CM | POA: Diagnosis not present

## 2018-10-15 DIAGNOSIS — I4819 Other persistent atrial fibrillation: Secondary | ICD-10-CM | POA: Diagnosis not present

## 2018-10-15 DIAGNOSIS — E039 Hypothyroidism, unspecified: Secondary | ICD-10-CM | POA: Diagnosis not present

## 2018-10-15 DIAGNOSIS — I10 Essential (primary) hypertension: Secondary | ICD-10-CM | POA: Diagnosis not present

## 2018-10-15 DIAGNOSIS — K210 Gastro-esophageal reflux disease with esophagitis: Secondary | ICD-10-CM | POA: Diagnosis not present

## 2018-10-15 DIAGNOSIS — N183 Chronic kidney disease, stage 3 (moderate): Secondary | ICD-10-CM | POA: Diagnosis not present

## 2018-10-15 DIAGNOSIS — M109 Gout, unspecified: Secondary | ICD-10-CM | POA: Diagnosis not present

## 2018-10-15 DIAGNOSIS — E1121 Type 2 diabetes mellitus with diabetic nephropathy: Secondary | ICD-10-CM | POA: Diagnosis not present

## 2018-10-15 DIAGNOSIS — E782 Mixed hyperlipidemia: Secondary | ICD-10-CM | POA: Diagnosis not present

## 2018-10-15 DIAGNOSIS — E1122 Type 2 diabetes mellitus with diabetic chronic kidney disease: Secondary | ICD-10-CM | POA: Diagnosis not present

## 2018-10-16 NOTE — Progress Notes (Signed)
Remote pacemaker transmission.   

## 2018-10-22 DIAGNOSIS — I5042 Chronic combined systolic (congestive) and diastolic (congestive) heart failure: Secondary | ICD-10-CM | POA: Diagnosis not present

## 2018-10-22 DIAGNOSIS — E1169 Type 2 diabetes mellitus with other specified complication: Secondary | ICD-10-CM | POA: Diagnosis not present

## 2018-10-22 DIAGNOSIS — E039 Hypothyroidism, unspecified: Secondary | ICD-10-CM | POA: Diagnosis not present

## 2018-10-22 DIAGNOSIS — E1140 Type 2 diabetes mellitus with diabetic neuropathy, unspecified: Secondary | ICD-10-CM | POA: Diagnosis not present

## 2018-10-22 DIAGNOSIS — G9009 Other idiopathic peripheral autonomic neuropathy: Secondary | ICD-10-CM | POA: Diagnosis not present

## 2018-10-22 DIAGNOSIS — D509 Iron deficiency anemia, unspecified: Secondary | ICD-10-CM | POA: Diagnosis not present

## 2018-10-22 DIAGNOSIS — K210 Gastro-esophageal reflux disease with esophagitis: Secondary | ICD-10-CM | POA: Diagnosis not present

## 2018-10-22 DIAGNOSIS — J849 Interstitial pulmonary disease, unspecified: Secondary | ICD-10-CM | POA: Diagnosis not present

## 2018-10-22 DIAGNOSIS — M1A00X0 Idiopathic chronic gout, unspecified site, without tophus (tophi): Secondary | ICD-10-CM | POA: Diagnosis not present

## 2018-10-22 DIAGNOSIS — N189 Chronic kidney disease, unspecified: Secondary | ICD-10-CM | POA: Diagnosis not present

## 2018-10-22 DIAGNOSIS — E782 Mixed hyperlipidemia: Secondary | ICD-10-CM | POA: Diagnosis not present

## 2018-10-22 DIAGNOSIS — I482 Chronic atrial fibrillation, unspecified: Secondary | ICD-10-CM | POA: Diagnosis not present

## 2018-10-25 ENCOUNTER — Encounter: Payer: Self-pay | Admitting: *Deleted

## 2018-10-25 ENCOUNTER — Other Ambulatory Visit: Payer: Self-pay | Admitting: *Deleted

## 2018-10-25 DIAGNOSIS — M545 Low back pain: Secondary | ICD-10-CM | POA: Diagnosis not present

## 2018-10-25 NOTE — Patient Outreach (Signed)
[place] [facility]) Care Management  [place]  10/25/2018   [name] May 13, 1956 [id number]   RN Health Coach Quarterly Outreach   Referral Date:  06/05/17 Referral Source:  [facility] Calls Reason for Referral:  Disease Management Education Insurance:   [employer]   Outreach Attempt:  Received return call back from patient.  HIPAA verified with patient.  Patient reporting she is having lower back pain.  States she is to follow up with primary care next week if pain is not relieved by muscle relaxants.  Denies any shortness of breath.  Reports small amount of swelling in her lower extremities.  Weight is 139 pounds (below last few months).  Patient stating she has been monitoring her salt intake and keeping her lower extremities elevated.  Blood sugar was 124.  Denies any hypo or hyperglycemic events.  Encounter Medications:  Outpatient Encounter Medications as of 10/25/2018  Medication Sig Note  . acetaminophen (TYLENOL) 500 MG tablet Take 1,000 mg by mouth every 6 (six) hours as needed (for pain.).    Marland Kitchen calcium carbonate (OS-CAL) 600 MG TABS Take 600 mg by mouth 2 (two) times daily with a meal.   02/01/2018: Patient reports taking once a day per primary care providers recommendation  . cholecalciferol (VITAMIN D) 1000 UNITS tablet Take 1,000 Units by mouth daily.   . furosemide (LASIX) 40 MG tablet Take 1 tablet (40 mg total) by mouth daily.   Marland Kitchen gabapentin (NEURONTIN) 100 MG capsule Take 100 mg by mouth 3 (three) times daily.    . hydrALAZINE (APRESOLINE) 25 MG tablet Take 0.5 tablets (12.5 mg total) by mouth every 8 (eight) hours.   . isosorbide mononitrate (IMDUR) 30 MG 24 hr tablet Take 0.5 tablets (15 mg total) by mouth daily.   Marland Kitchen levothyroxine (SYNTHROID, LEVOTHROID) 125 MCG tablet Take 100 mcg by mouth daily before breakfast.    . loperamide (IMODIUM) 2 MG capsule Take 2-4 mg by mouth 4 (four) times daily as needed for diarrhea or loose stools.   .  Magnesium 250 MG TABS Take 250 mg by mouth daily.    . metoprolol succinate (TOPROL-XL) 50 MG 24 hr tablet TAKE 1 TABLET BY MOUTH ONCE DAILY.   . pantoprazole (PROTONIX) 40 MG tablet Take 40 mg by mouth 2 (two) times daily.    . polyethylene glycol (MIRALAX / GLYCOLAX) packet Take 17 g by mouth daily as needed (for constipation.).    Marland Kitchen potassium chloride (K-DUR) 10 MEQ tablet Take 1 tablet (10 mEq total) by mouth every other day. 01/10/2018: Reports taking every day  . pravastatin (PRAVACHOL) 10 MG tablet Take 10 mg by mouth daily.   Marland Kitchen PROAIR HFA 108 (90 Base) MCG/ACT inhaler INHALE 2 PUFFS EVERY 6 HOURS AS NEEDED FOR SHORTNESS OF BREATH AND WHEEZING.   . Probiotic Product (ALIGN PO) Take 1 tablet by mouth daily.    . sitaGLIPtin (JANUVIA) 50 MG tablet Take 50 mg by mouth daily.   . temazepam (RESTORIL) 15 MG capsule Take 15 mg by mouth at bedtime as needed for sleep.    Alveda Reasons 15 MG TABS tablet TAKE 1 TABLET BY MOUTH ONCE A DAY.    No facility-administered encounter medications on file as of 10/25/2018.     Functional Status:  In your present state of health, do you have any difficulty performing the following activities: 07/30/2018  Hearing? Y  Comment total hearing loss in left ear and wears bilateral hearing aides  Vision? N  Difficulty concentrating or making decisions? N  Walking or climbing stairs? Y  Comment trouble climbing stairs  Dressing or bathing? N  Doing errands, shopping? N  Preparing Food and eating ? N  Using the Toilet? N  In the past six months, have you accidently leaked urine? Y  Do you have problems with loss of bowel control? Y  Managing your Medications? N  Managing your Finances? N  Housekeeping or managing your Housekeeping? N  Some recent data might be hidden    Fall/Depression Screening: Fall Risk  10/25/2018 07/30/2018 02/01/2018  Falls in the past year? 1 1 Yes  Comment last fall was in 2019 - patient reporting a fall on July 4th  Number falls in past  yr: 0 0 2 or more  Comment - - -  Injury with Fall? 0 0 No  Risk Factor Category  Moderate Risk (1 Point) Moderate Risk (1 Point) High Fall Risk  Risk for fall due to : Impaired vision;History of fall(s);Impaired balance/gait;Medication side effect;Impaired mobility History of fall(s);Impaired vision;Impaired balance/gait;Medication side effect;Impaired mobility History of fall(s);Impaired balance/gait;Impaired mobility;Medication side effect  Follow up Falls evaluation completed;Education provided;Falls prevention discussed Falls evaluation completed;Education provided;Falls prevention discussed Falls prevention discussed;Education provided   [facility] 2/9 Scores 07/30/2018 06/27/2017 05/21/2017 12/05/2016  PHQ - 2 Score 0 1 0 0   THN CM Care Plan Problem One     Most Recent Value  Care Plan Problem One  Knowledge deficiet related to self cae management of congestive heart failure  Role Documenting the Problem One  Mesic for Problem One  Active  THN Long Term Goal   Patient will report no heart failure exacerbations in the next 90 days  THN Long Term Goal Start Date  10/25/18  Interventions for Problem One Long Term Goal  Reviewed and discussed current care plan and goal with patient, discussed and encouraged low salt diet, discussed lower extremity elevation to help reduce swelling, encouraged continued daily weight monitoring and discussed when to call the physician based on weight, discussed heart failure zones, reviewed medications and encouraged medication compliance, encouraged to reschedule primary care appointments as soon as they are seeing patients  THN CM Short Term Goal #1   Patient will report weighing herself daily in the next 30 days.  THN CM Short Term Goal #1 Start Date  07/30/18  [facility] CM Short Term Goal #1 Met Date  10/25/18     Appointments:  Reports last attending appointment with primary care office in February 2020.  Was scheduled to have labs drawn last month and  due to the pandemic, labs needed to be cancelled and are due to be rescheduled as well as follow up appointment.  She is following up with office next week discussing her lower back pain.  Plan: RN Health Coach will send primary care provider quarterly update. RN Health Coach will make next telephone outreach to patient within the month of July.  [place] [phone] [email or web address]

## 2018-10-25 NOTE — Patient Outreach (Signed)
[place] [facility]) Care Management  10/25/2018  [name] Feb 15, 1956 [id number]   [name]  Referral Date:06/05/17 Referral Source:TOC Calls Reason for Referral:Disease Management Education Insurance:[employer]age   Outreach Attempt:  Outreach attempt #3 to patient for follow up. No answer. RN Health Coach left HIPAA compliant voicemail message along with contact information.  Plan:  RN Health Coach will send unsuccessful outreach letter to patient.  RN Health Coach will make another outreach attempt to patient within the month of June if no return call back from patient.  [name] [phone] [name]

## 2018-10-28 DIAGNOSIS — N183 Chronic kidney disease, stage 3 (moderate): Secondary | ICD-10-CM | POA: Diagnosis not present

## 2018-10-28 DIAGNOSIS — E782 Mixed hyperlipidemia: Secondary | ICD-10-CM | POA: Diagnosis not present

## 2018-10-28 DIAGNOSIS — I10 Essential (primary) hypertension: Secondary | ICD-10-CM | POA: Diagnosis not present

## 2018-10-28 DIAGNOSIS — E1122 Type 2 diabetes mellitus with diabetic chronic kidney disease: Secondary | ICD-10-CM | POA: Diagnosis not present

## 2018-11-25 DIAGNOSIS — E1122 Type 2 diabetes mellitus with diabetic chronic kidney disease: Secondary | ICD-10-CM | POA: Diagnosis not present

## 2018-11-25 DIAGNOSIS — K210 Gastro-esophageal reflux disease with esophagitis: Secondary | ICD-10-CM | POA: Diagnosis not present

## 2018-11-25 DIAGNOSIS — M109 Gout, unspecified: Secondary | ICD-10-CM | POA: Diagnosis not present

## 2018-11-25 DIAGNOSIS — N184 Chronic kidney disease, stage 4 (severe): Secondary | ICD-10-CM | POA: Diagnosis not present

## 2018-11-25 DIAGNOSIS — I5042 Chronic combined systolic (congestive) and diastolic (congestive) heart failure: Secondary | ICD-10-CM | POA: Diagnosis not present

## 2018-11-25 DIAGNOSIS — R000 Tachycardia, unspecified: Secondary | ICD-10-CM | POA: Diagnosis not present

## 2018-11-25 DIAGNOSIS — E039 Hypothyroidism, unspecified: Secondary | ICD-10-CM | POA: Diagnosis not present

## 2018-11-25 DIAGNOSIS — I4819 Other persistent atrial fibrillation: Secondary | ICD-10-CM | POA: Diagnosis not present

## 2018-11-25 DIAGNOSIS — E1121 Type 2 diabetes mellitus with diabetic nephropathy: Secondary | ICD-10-CM | POA: Diagnosis not present

## 2018-11-25 DIAGNOSIS — N183 Chronic kidney disease, stage 3 (moderate): Secondary | ICD-10-CM | POA: Diagnosis not present

## 2018-11-25 DIAGNOSIS — I10 Essential (primary) hypertension: Secondary | ICD-10-CM | POA: Diagnosis not present

## 2018-11-25 DIAGNOSIS — E782 Mixed hyperlipidemia: Secondary | ICD-10-CM | POA: Diagnosis not present

## 2018-11-28 DIAGNOSIS — D509 Iron deficiency anemia, unspecified: Secondary | ICD-10-CM | POA: Diagnosis not present

## 2018-11-28 DIAGNOSIS — E782 Mixed hyperlipidemia: Secondary | ICD-10-CM | POA: Diagnosis not present

## 2018-11-28 DIAGNOSIS — E039 Hypothyroidism, unspecified: Secondary | ICD-10-CM | POA: Diagnosis not present

## 2018-11-28 DIAGNOSIS — E1121 Type 2 diabetes mellitus with diabetic nephropathy: Secondary | ICD-10-CM | POA: Diagnosis not present

## 2018-11-28 DIAGNOSIS — I10 Essential (primary) hypertension: Secondary | ICD-10-CM | POA: Diagnosis not present

## 2018-11-28 DIAGNOSIS — E1122 Type 2 diabetes mellitus with diabetic chronic kidney disease: Secondary | ICD-10-CM | POA: Diagnosis not present

## 2018-11-28 DIAGNOSIS — R002 Palpitations: Secondary | ICD-10-CM | POA: Diagnosis not present

## 2018-12-05 DIAGNOSIS — R600 Localized edema: Secondary | ICD-10-CM | POA: Diagnosis not present

## 2018-12-05 DIAGNOSIS — E039 Hypothyroidism, unspecified: Secondary | ICD-10-CM | POA: Diagnosis not present

## 2018-12-05 DIAGNOSIS — K210 Gastro-esophageal reflux disease with esophagitis: Secondary | ICD-10-CM | POA: Diagnosis not present

## 2018-12-05 DIAGNOSIS — N183 Chronic kidney disease, stage 3 (moderate): Secondary | ICD-10-CM | POA: Diagnosis not present

## 2018-12-05 DIAGNOSIS — E1122 Type 2 diabetes mellitus with diabetic chronic kidney disease: Secondary | ICD-10-CM | POA: Diagnosis not present

## 2018-12-05 DIAGNOSIS — M109 Gout, unspecified: Secondary | ICD-10-CM | POA: Diagnosis not present

## 2018-12-05 DIAGNOSIS — D509 Iron deficiency anemia, unspecified: Secondary | ICD-10-CM | POA: Diagnosis not present

## 2018-12-05 DIAGNOSIS — M546 Pain in thoracic spine: Secondary | ICD-10-CM | POA: Diagnosis not present

## 2018-12-05 DIAGNOSIS — I5042 Chronic combined systolic (congestive) and diastolic (congestive) heart failure: Secondary | ICD-10-CM | POA: Diagnosis not present

## 2018-12-05 DIAGNOSIS — G9009 Other idiopathic peripheral autonomic neuropathy: Secondary | ICD-10-CM | POA: Diagnosis not present

## 2018-12-05 DIAGNOSIS — I4819 Other persistent atrial fibrillation: Secondary | ICD-10-CM | POA: Diagnosis not present

## 2018-12-05 DIAGNOSIS — E782 Mixed hyperlipidemia: Secondary | ICD-10-CM | POA: Diagnosis not present

## 2018-12-09 ENCOUNTER — Ambulatory Visit: Payer: PPO | Admitting: Gastroenterology

## 2018-12-11 ENCOUNTER — Encounter: Payer: Self-pay | Admitting: *Deleted

## 2018-12-11 ENCOUNTER — Encounter: Payer: Self-pay | Admitting: Gastroenterology

## 2018-12-11 ENCOUNTER — Telehealth: Payer: Self-pay | Admitting: Internal Medicine

## 2018-12-11 ENCOUNTER — Other Ambulatory Visit: Payer: Self-pay | Admitting: *Deleted

## 2018-12-11 ENCOUNTER — Telehealth: Payer: Self-pay | Admitting: Gastroenterology

## 2018-12-11 ENCOUNTER — Ambulatory Visit (INDEPENDENT_AMBULATORY_CARE_PROVIDER_SITE_OTHER): Payer: PPO | Admitting: Gastroenterology

## 2018-12-11 ENCOUNTER — Other Ambulatory Visit: Payer: Self-pay

## 2018-12-11 VITALS — BP 111/74 | HR 91 | Temp 96.6°F | Ht 58.5 in | Wt 148.0 lb

## 2018-12-11 DIAGNOSIS — R1310 Dysphagia, unspecified: Secondary | ICD-10-CM

## 2018-12-11 DIAGNOSIS — D509 Iron deficiency anemia, unspecified: Secondary | ICD-10-CM

## 2018-12-11 DIAGNOSIS — K625 Hemorrhage of anus and rectum: Secondary | ICD-10-CM | POA: Diagnosis not present

## 2018-12-11 DIAGNOSIS — R1319 Other dysphagia: Secondary | ICD-10-CM

## 2018-12-11 MED ORDER — PEG 3350-KCL-NA BICARB-NACL 420 G PO SOLR: 4000.0000 mL | Freq: Once | ORAL | 0 refills | Status: AC

## 2018-12-11 NOTE — Patient Instructions (Signed)
We have arranged a colonoscopy, upper endoscopy, and dilatation in the near future with Dr. Gala Romney.  PLEASE STOP XARELTO 48 HOURS PRIOR TO PROCEDURE. No Januvia the morning of procedure.  We will get blood work when you complete it in July.  Please let me know if you would like to pursue an iron infusion.  We will see you after the procedure!  I enjoyed seeing you again today! As you know, I value our relationship and want to provide genuine, compassionate, and quality care. I welcome your feedback. If you receive a survey regarding your visit,  I greatly appreciate you taking time to fill this out. See you next time!  Annitta Needs, PhD, ANP-BC [facility]

## 2018-12-11 NOTE — Assessment & Plan Note (Addendum)
Pleasant 63 year old female with history of anemia secondary to IDA and chronic disease, s/p colonoscopy at that time with internal hemorrhoids and diverticulosis, followed by EGD in 2018 unrevealing. Capsule completed in remote past (2009) with NSAID-related ulcerations. Now with drop in Hgb and ferritin recently and started back on oral iron. Chronic anticoagulation on Xarelto for afib. Rectal bleeding likely benign anorectal source but unable to exclude other etiology. Denying NSAIDs.  At this point, will pursue diagnostic colonoscopy in setting of rectal bleeding. She notes vague solid food and pill dysphagia, so EGD/dil can also be pursued. Remote history of dilation in past. Depending on clinical course, may need repeat small bowel evaluation; pacemaker is present.   Proceed with TCS/EGD/dilation with Dr. Gala Romney in near future: the risks, benefits, and alternatives have been discussed with the patient in detail. The patient states understanding and desires to proceed. Propofol due to polypharmacy HOLD XARELTO 48 HOURS PRIOR Hold iron 7 days prior Follow-up on upcoming labs through PCP in July 2020 Follow-up after procedures  ADDENDUM: July 2020 OUTSIDE LABS: Hgb 8.1, Hct 28.3, microcytic anemia, Creatinine 1.65, BUN 20, ferritin 43. Hgb has continued to drift over the past year, down 4 grams from last year.  CKD playing a role in anemia. At this point, anemia multifactorial in setting of chronic disease and IDA. I note she has seen Hematology since this visit and receiving iron infusions. Hgb still drifting, along with ferritin. In setting of Xarelto, occult blood loss could be anywhere in GI tract. Pursue TCS/EGD as planned.

## 2018-12-11 NOTE — Telephone Encounter (Signed)
Patient called and said that dr hall is going to check her bloodwork on July 2nd

## 2018-12-11 NOTE — Assessment & Plan Note (Signed)
In setting of anticoagulation. Known hemorrhoids on prior colonoscopy s/p banding in 2018. Likely recurrent. Diagnostic colonoscopy as planned.

## 2018-12-11 NOTE — Telephone Encounter (Signed)
Please have patient hold iron 7 days prior to procedure. I had forgotten to mention this on instructions.

## 2018-12-11 NOTE — Progress Notes (Signed)
Primary Care Physician:  Celene Squibb, MD  Referring Physician: Dr. Nevada Crane  Primary Gastroenterologist:  Dr. Gala Romney   Chief Complaint  Patient presents with  . Anemia    HPI:   [name] is a 63 y.o. female presenting today at the request of Dr. Nevada Crane due to anemia. She was last seen in Sept 2018 due to history of normocytic anemia in setting of renal insufficiency, IDA. Colonoscopy 2017 with normal TI, non-bleeding internal hemorrhoids, colonic diverticulosis. EGD Aug 2018 unrevealing. Capsule remote past in 2009 with tiny ulcerations felt to be NSAID-related. She was lost to follow-up. Hemorrhoid banding in 2018.   Labs reviewed from PCP. In July 2019 Hgb was 12.6, Hct 39.2, no iron studies. Nov 2019 Hgb stable at 13. Ferritin 49, iron 51. June 2020 Hgb down to 10.1, Hct 33.6, Creatinine 1.82, BUN 38, ferritin down to 22, iron 23, iron sats 6%.   She is on Xarelto with history of afib. Has been feeling tired and worn down lately but improved after starting oral iron. Has seen some intermittent rectal bleeding. Will drip and have to change clothes. Happens if frequent BMs. Has urgency for BMs at times. Some straining with BMs at times. Toilet time about 5-10 minutes. No diarrhea. BM usually every day. Certain foods will cause her to have more urgency to have a BM. Darker stool on iron. No abdominal pain. Good appetite. No reflux. Will have pill dysphagia if not drinking enough water. Occasional vague solid food dysphagia. No N/V. No unintentional weight loss. No NSAIDs. PPI BID. No rectal pain, itching, burning.    Past Medical History:  Diagnosis Date  . Amiodarone toxicity   . Anemia   . Chronic bronchitis   . Chronic systolic CHF (congestive heart failure) ([place])    a. 04/2017 Echo: EF 25%.  . Congenital heart disease   . Diabetes mellitus without complication ([place])    Type II  . Diverticulosis   . Fall due to stumbling October 08, 2013   Due to shoes  . GERD  (gastroesophageal reflux disease)   . Gout   . Hemorrhoids   . Hyperlipidemia   . Hyperparathyroidism   . Hypertension   . Hypothyroidism   . IDA (iron deficiency anemia)   . Microscopic colitis 9/11   Colonoscopy  . NICM (nonischemic cardiomyopathy) - tachycardia induced in the setting of AFib ([place])    a. 04/2017 Echo: EF 25%, basal-mid inferolateral, inf AK, triv AI, mild MR, sev dil LA, mod red RV fxn, mildly dil RA, mild to mod TR, PASP 31mmHg.  . Non-obstructive CAD (coronary artery disease)    a. 04/2017 Cath: LM 20p, LCX nl, RCA 20p/m.  Marland Kitchen Paroxysmal SVT (supraventricular tachycardia) ([place])   . Persistent atrial fibrillation 06/2013   a. 04/2017 Admit with CHF/NICM/AFib -> CHA2DS2VASc = 4-->Xarelto 15 mg daily started; b. 06/2017 s/p BiV PPM (SJM ser # [id number]) and AVN ablation.  . Presence of permanent cardiac pacemaker 06/29/2017   Biventricular pacemaker placed  . PVD (peripheral vascular disease) ([place]) 09/2004   Right common femoral endarterectomy in April of 2007  . Renal insufficiency   . Schatzki's ring    Last EGD with esophageal dilatation 9F  9/11  . Scoliosis   . Varicose veins right leg pain and swelling    Past Surgical History:  Procedure Laterality Date  . ASD REPAIR  Age 15   [facility]  . AV NODE  ABLATION N/A 06/29/2017   Procedure: AV NODE ABLATION;  Surgeon: Evans Lance, MD;  Location: [place] [facility];  Service: Cardiovascular;  Laterality: N/A;  . BIOPSY  02/13/2017   Procedure: BIOPSY;  Surgeon: Daneil Dolin, MD;  Location: [facility] SU[facility];  Service: Endoscopy;;  duodenum gastric  . BIV PACEMAKER INSERTION CRT-P N/A 06/29/2017   Procedure: BIV PACEMAKER INSERTION CRT-P;  Surgeon: Evans Lance, MD;  Location: [place] [facility];  Service: Cardiovascular;  Laterality: N/A;  . CARDIOVERSION N/A 11/13/2013   Procedure: CARDIOVERSION;  Surgeon: Herminio Commons, MD;  Location: [facility] ORS;  Service: Endoscopy;   Laterality: N/A;  . COLONOSCOPY  03/04/2010   anal canal hemorrhoids otherwise normal. TI normal. Bx showed lymphocytic colitis. next TCS 02/2020  . COLONOSCOPY  April 2008   [place]: Pancolonic diverticulosis, external hemorrhoids  . COLONOSCOPY N/A 11/30/2015   RMR: Normal terminal ileum for 10 cm, nonbleeding grade 1 internal hemorrhoids, colonic diverticulosis. Next colonoscopy in 2027.  Marland Kitchen ELAS  06-01-11   Right saphenous ELAS   . ESOPHAGEAL DILATION N/A 05/28/2015   Procedure: ESOPHAGEAL DILATION;  Surgeon: Daneil Dolin, MD;  Location: [facility];  Service: Endoscopy;  Laterality: N/A;  . ESOPHAGOGASTRODUODENOSCOPY  03/04/2010   noncritical appearing Schatzki ring. SB bx negative  . ESOPHAGOGASTRODUODENOSCOPY  05/2008   erosive reflux esophagatitis, noncritical Schatzki ring  . ESOPHAGOGASTRODUODENOSCOPY N/A 05/28/2015   ZCH:[id number] esophagus somewhat baggy, likely due to underlying esophageal motility disorder/small HH   . ESOPHAGOGASTRODUODENOSCOPY N/A 02/13/2017   Dr. Gala Romney: subtly abnormal stomach of doubtful clinical significant s/p biopsy, normal duodenum s/p biopsy, fundic gland polyp on path, normal duodenal biopsy  . Major  2010  . Left hemithyroidectomy    . Left parathyroidectomy  2007  . Parathyroid adenoma  2007  . Right common femoral endarterectomy  2007  . RIGHT/LEFT HEART CATH AND CORONARY ANGIOGRAPHY N/A 05/15/2017   Procedure: RIGHT/LEFT HEART CATH AND CORONARY ANGIOGRAPHY;  Surgeon: Troy Sine, MD;  Location: [place] [facility];  Service: Cardiovascular;  Laterality: N/A;  . Small bowel capsule  12 2009   Mid to distal small bowel with edema, erosions, tiny ulceration felt to be NSAID related  . TONSILLECTOMY      Current Outpatient Medications  Medication Sig Dispense Refill  . acetaminophen (TYLENOL) 500 MG tablet Take 1,000 mg by mouth every 6 (six) hours as needed (for pain.).     Marland Kitchen BREO ELLIPTA 100-25 MCG/INH AEPB Inhale 2 puffs into the  lungs daily.    . cyclobenzaprine (FLEXERIL) 5 MG tablet Take 1 tablet by mouth 3 (three) times daily as needed.    . furosemide (LASIX) 40 MG tablet Take 1 tablet (40 mg total) by mouth daily. 30 tablet 11  . gabapentin (NEURONTIN) 100 MG capsule Take 100 mg by mouth 2 (two) times daily.     . hydrALAZINE (APRESOLINE) 25 MG tablet Take 0.5 tablets (12.5 mg total) by mouth every 8 (eight) hours. 45 tablet 6  . isosorbide mononitrate (IMDUR) 30 MG 24 hr tablet Take 0.5 tablets (15 mg total) by mouth daily. 30 tablet 3  . levothyroxine (SYNTHROID) 100 MCG tablet Take 1 tablet by mouth daily.    Marland Kitchen loperamide (IMODIUM) 2 MG capsule Take 2-4 mg by mouth 4 (four) times daily as needed for diarrhea or loose stools.    . metoprolol succinate (TOPROL-XL) 50 MG 24 hr tablet TAKE 1 TABLET BY MOUTH ONCE DAILY. 30 tablet 10  .  Multiple Vitamin (MULTIVITAMIN) tablet Take 1 tablet by mouth daily.    . pantoprazole (PROTONIX) 40 MG tablet Take 40 mg by mouth 2 (two) times daily.     . polyethylene glycol (MIRALAX / GLYCOLAX) packet Take 17 g by mouth daily as needed (for constipation.).     Marland Kitchen potassium chloride (K-DUR) 10 MEQ tablet Take 1 tablet (10 mEq total) by mouth every other day.    . pravastatin (PRAVACHOL) 10 MG tablet Take 10 mg by mouth daily.    Marland Kitchen PROAIR HFA 108 (90 Base) MCG/ACT inhaler INHALE 2 PUFFS EVERY 6 HOURS AS NEEDED FOR SHORTNESS OF BREATH AND WHEEZING. 8.5 g 2  . Probiotic Product (ALIGN PO) Take 1 tablet by mouth daily.     . sitaGLIPtin (JANUVIA) 50 MG tablet Take 50 mg by mouth daily.    . temazepam (RESTORIL) 15 MG capsule Take 15 mg by mouth at bedtime as needed for sleep.     Alveda Reasons 15 MG TABS tablet TAKE 1 TABLET BY MOUTH ONCE A DAY. 30 tablet 11  . calcium carbonate (OS-CAL) 600 MG TABS Take 600 mg by mouth 2 (two) times daily with a meal.      . cholecalciferol (VITAMIN D) 1000 UNITS tablet Take 1,000 Units by mouth daily.    Marland Kitchen levothyroxine (SYNTHROID, LEVOTHROID) 125 MCG  tablet Take 100 mcg by mouth daily before breakfast.     . Magnesium 250 MG TABS Take 250 mg by mouth daily.      No current facility-administered medications for this visit.     Allergies as of 12/11/2018 - Review Complete 12/11/2018  Allergen Reaction Noted  . Flecainide Nausea Only and Other (See Comments) 11/07/2013  . Hydrocodone-acetaminophen Nausea Only and Other (See Comments)   . Ibuprofen Other (See Comments) 09/05/2011  . Oxycodone hcl Nausea Only and Other (See Comments)   . Penicillins Nausea Only and Other (See Comments)     Family History  Problem Relation Age of Onset  . Stroke Mother   . Heart disease Mother        CHF  - Amputation-Right Leg  . Hyperlipidemia Mother   . Hypertension Mother   . Congestive Heart Failure Mother   . Lung cancer Father   . Cancer Father        Lung  . Other Brother        back problems  . Heart attack Paternal Grandmother   . Cancer Maternal Grandmother        uterine    Social History   Socioeconomic History  . Marital status: Single    Spouse name: Not on file  . Number of children: 0  . Years of education: Not on file  . Highest education level: Not on file  Occupational History  . Occupation: Sales promotion account executive  Social Needs  . Financial resource strain: Not on file  . Food insecurity    Worry: Not on file    Inability: Not on file  . Transportation needs    Medical: Not on file    Non-medical: Not on file  Tobacco Use  . Smoking status: Never Smoker  . Smokeless tobacco: Never Used  . Tobacco comment: Never smoked  Substance and Sexual Activity  . Alcohol use: No    Alcohol/week: 0.0 standard drinks  . Drug use: No  . Sexual activity: Never    Birth control/protection: Post-menopausal  Lifestyle  . Physical activity    Days per week: Not on file  Minutes per session: Not on file  . Stress: Not on file  Relationships  . Social Herbalist on phone: Not on file    Gets together: Not on file     Attends religious service: Not on file    Active member of club or organization: Not on file    Attends meetings of clubs or organizations: Not on file    Relationship status: Not on file  . Intimate partner violence    Fear of current or ex partner: Not on file    Emotionally abused: Not on file    Physically abused: Not on file    Forced sexual activity: Not on file  Other Topics Concern  . Not on file  Social History Narrative  . Not on file    Review of Systems: Gen: see HPI CV: Denies chest pain, heart palpitations, peripheral edema, syncope.  Resp: +DOE GI: Denies dysphagia or odynophagia. Denies jaundice, hematemesis, fecal incontinence. GU : Denies urinary burning, urinary frequency, urinary hesitancy MS: Denies joint pain, muscle weakness, cramps, or limitation of movement.  Derm: Denies rash, itching, dry skin Psych: Denies depression, anxiety, memory loss, and confusion Heme: see HPI  Physical Exam: BP 111/74   Pulse 91   Temp (!) 96.6 F (35.9 C) (Oral)   Ht 4' 10.5" (1.486 m)   Wt 148 lb (67.1 kg)   BMI 30.41 kg/m  General:   Alert and oriented. Pleasant and cooperative. Well-nourished and well-developed.  Head:  Normocephalic and atraumatic. Eyes:  Without icterus, sclera clear and conjunctiva pink.  Ears:  Mild hard of hearing Nose:  No deformity, discharge,  or lesions. Mouth:  No deformity or lesions, oral mucosa pink.  Lungs:  Clear to auscultation bilaterally.  Heart:  S1, S2 present without murmurs appreciated.  Abdomen:  +BS, soft, non-tender and non-distended. No HSM noted. No guarding or rebound. No masses appreciated.  Rectal:  Deferred  Msk:  Symmetrical without gross deformities. Normal posture. Extremities:  Without  edema. Neurologic:  Alert and  oriented x4 Psych:  Alert and cooperative. Normal mood and affect.  July 2019 Hgb was 12.6, Hct 39.2, no iron studies. Nov 2019 Hgb stable at 13. Ferritin 49, iron 51. June 2020 Hgb down to 10.1,  Hct 33.6, Creatinine 1.82, BUN 38, ferritin down to 22, iron 23, iron sats 6%.

## 2018-12-11 NOTE — Assessment & Plan Note (Signed)
Solid food and pill dysphagia. Dilation in past. Motility disorder likely underlying as well. EGD/dilation as planned. PPI BID.

## 2018-12-11 NOTE — Telephone Encounter (Signed)
I asked patient if she was taking iron. She said she was for now but it may change after she has her lab work drawn per pt. Patient is scheduled in August. She is aware if she is still taking iron that this needs to be stopped 7 days prior. Patient will call us in August if she is still on Iron.

## 2018-12-18 ENCOUNTER — Other Ambulatory Visit: Payer: Self-pay | Admitting: Internal Medicine

## 2018-12-18 ENCOUNTER — Other Ambulatory Visit: Payer: Self-pay

## 2018-12-18 DIAGNOSIS — Z20822 Contact with and (suspected) exposure to covid-19: Secondary | ICD-10-CM

## 2018-12-18 DIAGNOSIS — R6889 Other general symptoms and signs: Secondary | ICD-10-CM | POA: Diagnosis not present

## 2018-12-19 IMAGING — NM NM PULMONARY VENT & PERF
15 series · 15 of 15 positions shown · non-contrast
Comparison: Chest x-ray from the same day

CLINICAL DATA: Dyspnea.  CHF and elevated D-dimer.

EXAM:
NUCLEAR MEDICINE VENTILATION - PERFUSION LUNG SCAN
TECHNIQUE: Ventilation images were obtained in multiple projections using
inhaled aerosol Xc-EEm DTPA. Perfusion images were obtained in
multiple projections after intravenous injection of Xc-EEm MAA.
RADIOPHARMACEUTICALS:  Thirty mCi Jechnetium-KKm DTPA aerosol
inhalation and 4.2 mCi Jechnetium-KKm MAA IV

[Series 1: ant/post vent · 4.14mm/px · 1 of 1 slices shown]
[im 1/1]
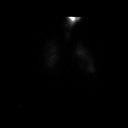

[Series 2: lao/rpo vent · 4.14mm/px · 1 of 1 slices shown (1 of 2)]
[im 1/1]
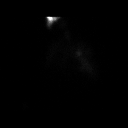

[Series 2: lao/rpo vent · 4.14mm/px · 1 of 1 slices shown (2 of 2)]
[im 1/1]
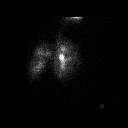

[Series 3: lpo/rao vent · 4.14mm/px · 1 of 1 slices shown (1 of 2)]
[im 1/1]
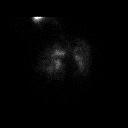

[Series 3: lpo/rao vent · 4.14mm/px · 1 of 1 slices shown (2 of 2)]
[im 1/1]
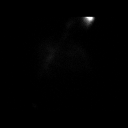

[Series 4: lt lat/rt lat vent · 4.14mm/px · 1 of 1 slices shown (1 of 2)]
[im 1/1]
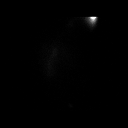

[Series 4: lt lat/rt lat vent · 4.14mm/px · 1 of 1 slices shown (2 of 2)]
[im 1/1]
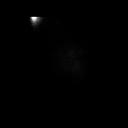

[Series 5: lt lat/rt lat perf · 4.14mm/px · 1 of 1 slices shown (1 of 2)]
[im 1/1]
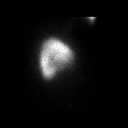

[Series 5: lt lat/rt lat perf · 4.14mm/px · 1 of 1 slices shown (2 of 2)]
[im 1/1]
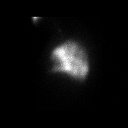

[Series 6: lpo/rao perf · 4.14mm/px · 1 of 1 slices shown (1 of 2)]
[im 1/1]
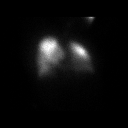

[Series 6: lpo/rao perf · 4.14mm/px · 1 of 1 slices shown (2 of 2)]
[im 1/1]
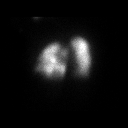

[Series 7: ant/post perf · 4.14mm/px · 1 of 1 slices shown (1 of 2)]
[im 1/1]
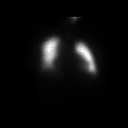

[Series 7: ant/post perf · 4.14mm/px · 1 of 1 slices shown (2 of 2)]
[im 1/1]
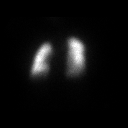

[Series 8: lao/rpo perf · 4.14mm/px · 1 of 1 slices shown (1 of 2)]
[im 1/1]
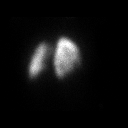

[Series 8: lao/rpo perf · 4.14mm/px · 1 of 1 slices shown (2 of 2)]
[im 1/1]
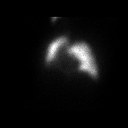

[15 of 15 positions shown; findings below may reference images not displayed]

FINDINGS: There are defects on AP view from cardiomegaly and lateral right
chest wall deformity/pleural thickening. On the LPO, there is a
moderate pleural-based pefusion defect at the lower lobe that is not
clearly seen on the other views, suspect this is related to a
Bochdalek's hernia seen on 01/04/2017 abdominal CT and comparison
chest x-ray. As well, this is likely matched, as permitted by
heterogeneous airway distribution in this patient with history of
chronic bronchitis. Other apparent defects are attributable to
mediastinal/hilar structures.
IMPRESSION: Low probability for pulmonary embolism by PIOPED 2.

## 2018-12-20 ENCOUNTER — Other Ambulatory Visit: Payer: Self-pay | Admitting: *Deleted

## 2018-12-20 DIAGNOSIS — E1140 Type 2 diabetes mellitus with diabetic neuropathy, unspecified: Secondary | ICD-10-CM | POA: Diagnosis not present

## 2018-12-20 DIAGNOSIS — E559 Vitamin D deficiency, unspecified: Secondary | ICD-10-CM | POA: Diagnosis not present

## 2018-12-20 DIAGNOSIS — E039 Hypothyroidism, unspecified: Secondary | ICD-10-CM | POA: Diagnosis not present

## 2018-12-20 DIAGNOSIS — E1121 Type 2 diabetes mellitus with diabetic nephropathy: Secondary | ICD-10-CM | POA: Diagnosis not present

## 2018-12-20 DIAGNOSIS — E1122 Type 2 diabetes mellitus with diabetic chronic kidney disease: Secondary | ICD-10-CM | POA: Diagnosis not present

## 2018-12-20 DIAGNOSIS — G5601 Carpal tunnel syndrome, right upper limb: Secondary | ICD-10-CM | POA: Diagnosis not present

## 2018-12-20 DIAGNOSIS — E782 Mixed hyperlipidemia: Secondary | ICD-10-CM | POA: Diagnosis not present

## 2018-12-20 DIAGNOSIS — D509 Iron deficiency anemia, unspecified: Secondary | ICD-10-CM | POA: Diagnosis not present

## 2018-12-20 DIAGNOSIS — E1169 Type 2 diabetes mellitus with other specified complication: Secondary | ICD-10-CM | POA: Diagnosis not present

## 2018-12-20 DIAGNOSIS — G9009 Other idiopathic peripheral autonomic neuropathy: Secondary | ICD-10-CM | POA: Diagnosis not present

## 2018-12-20 DIAGNOSIS — E1142 Type 2 diabetes mellitus with diabetic polyneuropathy: Secondary | ICD-10-CM | POA: Diagnosis not present

## 2018-12-20 DIAGNOSIS — M545 Low back pain: Secondary | ICD-10-CM | POA: Diagnosis not present

## 2018-12-20 NOTE — Patient Outreach (Signed)
[facility]) Care Management  12/20/2018  WILDER AMODEI 08-Dec-1955 [id number]   Case Closure/Transition to [facility] Health/CCI  Referral Date:06/05/17 Referral Source:TOC Calls Reason for Referral:Disease Management Education Insurance:Health [employer]   Outreach:  Patient case has been transitioned to [facility] Health/CCI for further Care Management assistance.  Plan: RN Health Coach will close case at this time. RN Health Coach will send primary care provider Care Management Case Closure Letter. RN Health Coach will make patient inactive with [facility] Care Management at this time.  [place] [phone] [email or web address]

## 2018-12-23 LAB — NOVEL CORONAVIRUS, NAA: SARS-CoV-2, NAA: NOT DETECTED

## 2018-12-26 DIAGNOSIS — R0600 Dyspnea, unspecified: Secondary | ICD-10-CM | POA: Diagnosis not present

## 2018-12-26 DIAGNOSIS — Z6831 Body mass index (BMI) 31.0-31.9, adult: Secondary | ICD-10-CM | POA: Diagnosis not present

## 2018-12-26 DIAGNOSIS — Z0000 Encounter for general adult medical examination without abnormal findings: Secondary | ICD-10-CM | POA: Diagnosis not present

## 2018-12-26 DIAGNOSIS — D509 Iron deficiency anemia, unspecified: Secondary | ICD-10-CM | POA: Diagnosis not present

## 2018-12-26 DIAGNOSIS — R600 Localized edema: Secondary | ICD-10-CM | POA: Diagnosis not present

## 2018-12-26 DIAGNOSIS — E1122 Type 2 diabetes mellitus with diabetic chronic kidney disease: Secondary | ICD-10-CM | POA: Diagnosis not present

## 2018-12-26 DIAGNOSIS — Z6833 Body mass index (BMI) 33.0-33.9, adult: Secondary | ICD-10-CM | POA: Diagnosis not present

## 2018-12-26 DIAGNOSIS — R0602 Shortness of breath: Secondary | ICD-10-CM | POA: Diagnosis not present

## 2018-12-26 DIAGNOSIS — I5040 Unspecified combined systolic (congestive) and diastolic (congestive) heart failure: Secondary | ICD-10-CM | POA: Diagnosis not present

## 2018-12-26 DIAGNOSIS — M25511 Pain in right shoulder: Secondary | ICD-10-CM | POA: Diagnosis not present

## 2018-12-26 DIAGNOSIS — I5042 Chronic combined systolic (congestive) and diastolic (congestive) heart failure: Secondary | ICD-10-CM | POA: Diagnosis not present

## 2018-12-26 DIAGNOSIS — M109 Gout, unspecified: Secondary | ICD-10-CM | POA: Diagnosis not present

## 2018-12-26 DIAGNOSIS — E782 Mixed hyperlipidemia: Secondary | ICD-10-CM | POA: Diagnosis not present

## 2018-12-26 DIAGNOSIS — Z0001 Encounter for general adult medical examination with abnormal findings: Secondary | ICD-10-CM | POA: Diagnosis not present

## 2018-12-26 DIAGNOSIS — E039 Hypothyroidism, unspecified: Secondary | ICD-10-CM | POA: Diagnosis not present

## 2018-12-26 DIAGNOSIS — N183 Chronic kidney disease, stage 3 (moderate): Secondary | ICD-10-CM | POA: Diagnosis not present

## 2018-12-31 ENCOUNTER — Telehealth: Payer: Self-pay

## 2018-12-31 NOTE — Telephone Encounter (Signed)
Pt called back to get results. Pt informed covid 19 negative .  Pt given signs and symptoms of Covid 19 and to call 911 for any resp. Signs or distress. Pt verbalized understanding.

## 2019-01-08 ENCOUNTER — Encounter: Payer: PPO | Admitting: *Deleted

## 2019-01-08 DIAGNOSIS — N183 Chronic kidney disease, stage 3 (moderate): Secondary | ICD-10-CM | POA: Diagnosis not present

## 2019-01-08 DIAGNOSIS — R600 Localized edema: Secondary | ICD-10-CM | POA: Diagnosis not present

## 2019-01-08 DIAGNOSIS — R0600 Dyspnea, unspecified: Secondary | ICD-10-CM | POA: Diagnosis not present

## 2019-01-08 DIAGNOSIS — D509 Iron deficiency anemia, unspecified: Secondary | ICD-10-CM | POA: Diagnosis not present

## 2019-01-08 DIAGNOSIS — I5042 Chronic combined systolic (congestive) and diastolic (congestive) heart failure: Secondary | ICD-10-CM | POA: Diagnosis not present

## 2019-01-08 DIAGNOSIS — R0602 Shortness of breath: Secondary | ICD-10-CM | POA: Diagnosis not present

## 2019-01-08 NOTE — Telephone Encounter (Signed)
requested

## 2019-01-08 NOTE — Telephone Encounter (Signed)
[name]: please request blood work from PCP completed early July.

## 2019-01-09 ENCOUNTER — Ambulatory Visit: Payer: PPO | Admitting: *Deleted

## 2019-01-11 DIAGNOSIS — R109 Unspecified abdominal pain: Secondary | ICD-10-CM | POA: Diagnosis not present

## 2019-01-11 DIAGNOSIS — R600 Localized edema: Secondary | ICD-10-CM | POA: Diagnosis not present

## 2019-01-11 DIAGNOSIS — I130 Hypertensive heart and chronic kidney disease with heart failure and stage 1 through stage 4 chronic kidney disease, or unspecified chronic kidney disease: Secondary | ICD-10-CM | POA: Diagnosis not present

## 2019-01-11 DIAGNOSIS — E1122 Type 2 diabetes mellitus with diabetic chronic kidney disease: Secondary | ICD-10-CM | POA: Diagnosis not present

## 2019-01-11 DIAGNOSIS — E782 Mixed hyperlipidemia: Secondary | ICD-10-CM | POA: Diagnosis not present

## 2019-01-11 DIAGNOSIS — G9009 Other idiopathic peripheral autonomic neuropathy: Secondary | ICD-10-CM | POA: Diagnosis not present

## 2019-01-11 DIAGNOSIS — I5042 Chronic combined systolic (congestive) and diastolic (congestive) heart failure: Secondary | ICD-10-CM | POA: Diagnosis not present

## 2019-01-11 DIAGNOSIS — N183 Chronic kidney disease, stage 3 (moderate): Secondary | ICD-10-CM | POA: Diagnosis not present

## 2019-01-11 DIAGNOSIS — E039 Hypothyroidism, unspecified: Secondary | ICD-10-CM | POA: Diagnosis not present

## 2019-01-11 DIAGNOSIS — I4819 Other persistent atrial fibrillation: Secondary | ICD-10-CM | POA: Diagnosis not present

## 2019-01-11 DIAGNOSIS — K219 Gastro-esophageal reflux disease without esophagitis: Secondary | ICD-10-CM | POA: Diagnosis not present

## 2019-01-11 DIAGNOSIS — D509 Iron deficiency anemia, unspecified: Secondary | ICD-10-CM | POA: Diagnosis not present

## 2019-01-13 ENCOUNTER — Ambulatory Visit (INDEPENDENT_AMBULATORY_CARE_PROVIDER_SITE_OTHER): Payer: PPO | Admitting: *Deleted

## 2019-01-13 DIAGNOSIS — I428 Other cardiomyopathies: Secondary | ICD-10-CM | POA: Diagnosis not present

## 2019-01-13 LAB — CUP PACEART REMOTE DEVICE CHECK
Date Time Interrogation Session: 20200720175348
Implantable Lead Implant Date: 20190104
Implantable Lead Implant Date: 20190104
Implantable Lead Location: 753858
Implantable Lead Location: 753860
Implantable Pulse Generator Implant Date: 20190104
Pulse Gen Model: 3262
Pulse Gen Serial Number: 8931822

## 2019-01-22 ENCOUNTER — Encounter (HOSPITAL_COMMUNITY): Payer: Self-pay | Admitting: Surgery

## 2019-01-22 ENCOUNTER — Other Ambulatory Visit: Payer: Self-pay

## 2019-01-22 DIAGNOSIS — R0602 Shortness of breath: Secondary | ICD-10-CM | POA: Diagnosis not present

## 2019-01-22 DIAGNOSIS — M62830 Muscle spasm of back: Secondary | ICD-10-CM | POA: Diagnosis not present

## 2019-01-23 ENCOUNTER — Inpatient Hospital Stay (HOSPITAL_COMMUNITY): Payer: PPO

## 2019-01-23 ENCOUNTER — Inpatient Hospital Stay (HOSPITAL_COMMUNITY): Payer: PPO | Attending: Hematology | Admitting: Hematology

## 2019-01-23 ENCOUNTER — Encounter (HOSPITAL_COMMUNITY): Payer: Self-pay | Admitting: Hematology

## 2019-01-23 VITALS — BP 100/60 | HR 70 | Temp 97.8°F | Resp 18 | Wt 143.0 lb

## 2019-01-23 DIAGNOSIS — Z950 Presence of cardiac pacemaker: Secondary | ICD-10-CM | POA: Diagnosis not present

## 2019-01-23 DIAGNOSIS — Z7901 Long term (current) use of anticoagulants: Secondary | ICD-10-CM | POA: Diagnosis not present

## 2019-01-23 DIAGNOSIS — I4891 Unspecified atrial fibrillation: Secondary | ICD-10-CM | POA: Diagnosis not present

## 2019-01-23 DIAGNOSIS — Z801 Family history of malignant neoplasm of trachea, bronchus and lung: Secondary | ICD-10-CM | POA: Diagnosis not present

## 2019-01-23 DIAGNOSIS — I509 Heart failure, unspecified: Secondary | ICD-10-CM | POA: Diagnosis not present

## 2019-01-23 DIAGNOSIS — E1140 Type 2 diabetes mellitus with diabetic neuropathy, unspecified: Secondary | ICD-10-CM | POA: Diagnosis not present

## 2019-01-23 DIAGNOSIS — D649 Anemia, unspecified: Secondary | ICD-10-CM | POA: Insufficient documentation

## 2019-01-23 DIAGNOSIS — Z808 Family history of malignant neoplasm of other organs or systems: Secondary | ICD-10-CM

## 2019-01-23 DIAGNOSIS — K648 Other hemorrhoids: Secondary | ICD-10-CM | POA: Diagnosis not present

## 2019-01-23 DIAGNOSIS — R0609 Other forms of dyspnea: Secondary | ICD-10-CM | POA: Diagnosis not present

## 2019-01-23 DIAGNOSIS — K5900 Constipation, unspecified: Secondary | ICD-10-CM

## 2019-01-23 LAB — CBC WITH DIFFERENTIAL/PLATELET
Abs Immature Granulocytes: 0.02 10*3/uL (ref 0.00–0.07)
Basophils Absolute: 0 10*3/uL (ref 0.0–0.1)
Basophils Relative: 0 %
Eosinophils Absolute: 0 10*3/uL (ref 0.0–0.5)
Eosinophils Relative: 0 %
HCT: 28.8 % — ABNORMAL LOW (ref 36.0–46.0)
Hemoglobin: 7.9 g/dL — ABNORMAL LOW (ref 12.0–15.0)
Immature Granulocytes: 0 %
Lymphocytes Relative: 10 %
Lymphs Abs: 0.6 10*3/uL — ABNORMAL LOW (ref 0.7–4.0)
MCH: 25.3 pg — ABNORMAL LOW (ref 26.0–34.0)
MCHC: 27.4 g/dL — ABNORMAL LOW (ref 30.0–36.0)
MCV: 92.3 fL (ref 80.0–100.0)
Monocytes Absolute: 0.3 10*3/uL (ref 0.1–1.0)
Monocytes Relative: 5 %
Neutro Abs: 5.4 10*3/uL (ref 1.7–7.7)
Neutrophils Relative %: 85 %
Platelets: 287 10*3/uL (ref 150–400)
RBC: 3.12 MIL/uL — ABNORMAL LOW (ref 3.87–5.11)
RDW: 16.1 % — ABNORMAL HIGH (ref 11.5–15.5)
WBC: 6.4 10*3/uL (ref 4.0–10.5)
nRBC: 0 % (ref 0.0–0.2)

## 2019-01-23 LAB — IRON AND TIBC
Iron: 172 ug/dL — ABNORMAL HIGH (ref 28–170)
Saturation Ratios: 39 % — ABNORMAL HIGH (ref 10.4–31.8)
TIBC: 444 ug/dL (ref 250–450)
UIBC: 272 ug/dL

## 2019-01-23 LAB — RETICULOCYTES
Immature Retic Fract: 37.6 % — ABNORMAL HIGH (ref 2.3–15.9)
RBC.: 3.12 MIL/uL — ABNORMAL LOW (ref 3.87–5.11)
Retic Count, Absolute: 88.6 10*3/uL (ref 19.0–186.0)
Retic Ct Pct: 2.8 % (ref 0.4–3.1)

## 2019-01-23 LAB — FOLATE: Folate: 32.9 ng/mL (ref >5.9)

## 2019-01-23 LAB — FERRITIN: Ferritin: 21 ng/mL (ref 11–307)

## 2019-01-23 LAB — VITAMIN B12: Vitamin B-12: 1109 pg/mL — ABNORMAL HIGH (ref 180–914)

## 2019-01-23 LAB — LACTATE DEHYDROGENASE: LDH: 125 U/L (ref 98–192)

## 2019-01-23 NOTE — Assessment & Plan Note (Signed)
1.  Normocytic anemia: - Referred by Dr. Wende Neighbors for further management of normocytic anemia. -CBC on 12/26/2018 shows hemoglobin 8.1 with MCV of 88 and normal white count and platelet count. -Patient reports intermittent bleeding per rectum for last several months.  She is reportedly scheduled for another EGD and colonoscopy on 02/24/2019.  Last blood transfusion was in 2009. - EGD on 02/13/2017 shows normal esophagus, subtly abnormal stomach (biopsy fundic gland polyp), normal duodenal bulb and second part of the duodenum. -Colonoscopy on 11/30/2015 showing nonbleeding internal hemorrhoids, colonic diverticulosis. - Etiology includes chronic kidney disease and nutritional deficiencies. -She has been taking iron tablet daily for the past 2 months. -We will check her CBC today, repeat her ferritin, iron panel.  We will also check her U11 and folic acid and copper levels.  We will also check SPEP, LDH and reticulocyte count. - Given her congestive heart failure, she will require optimization of hemoglobin sooner.  If the iron levels are low, have recommended parenteral iron therapy with Feraheme.  We talked about the side effects and rare chance of allergic reactions.  2.  CHF: - She has a pacemaker.  Ejection fraction was 25%. -We will continue metoprolol XL 100 mg half tablet daily.  She is also on hydralazine 12.5 mg 3 times a day and Imdur 30 mg half tablet daily. -She is on Xarelto 15 mg daily for A. fib.  3.  Diabetic neuropathy: - She will continue gabapentin 100 mg 3 times a day.

## 2019-01-23 NOTE — Patient Instructions (Signed)
[place] at [facility] Discharge Instructions  You were seen today by Dr. Delton Coombes. He went over your history, family history and how you've been feeling lately. He will have blood work drawn today. He will see you back in 3 weeks for labs and follow up.   Thank you for choosing [place] at [facility] to provide your oncology and hematology care.  To afford each patient quality time with our provider, please arrive at least 15 minutes before your scheduled appointment time.   If you have a lab appointment with the [facility] please come in thru the  Main Entrance and check in at the main information desk  You need to re-schedule your appointment should you arrive 10 or more minutes late.  We strive to give you quality time with our providers, and arriving late affects you and other patients whose appointments are after yours.  Also, if you no show three or more times for appointments you may be dismissed from the clinic at the providers discretion.     Again, [facility] you for [facility].  Our hope is that these requests will decrease the amount of time that you wait before being seen by our physicians.       _____________________________________________________________  Should you have questions after your visit to [facility], please contact our office at [phone] between the hours of 8:00 a.m. and 4:30 p.m.  Voicemails left after 4:00 p.m. will not be returned until the following business day.  For prescription refill requests, have your pharmacy contact our office and allow 72 hours.    Cancer Center Support Programs:   > Cancer Support Group  2nd Tuesday of the month 1pm-2pm, Journey Room

## 2019-01-23 NOTE — Progress Notes (Signed)
CONSULT NOTE  Patient Care Team: Celene Squibb, MD as PCP - General (Internal Medicine) Evans Lance, MD as PCP - Cardiology (Cardiology) Gala Romney Cristopher Estimable, MD (Gastroenterology) Fran Lowes, MD (Nephrology)  CHIEF COMPLAINTS/PURPOSE OF CONSULTATION:  Normocytic anemia.  HISTORY OF PRESENTING ILLNESS:  [name] 63 y.o. female is seen in consultation today at the request of Dr. Nevada Crane for normocytic anemia management.  Recent CBC on 12/26/2018 showed white count of 8.1, hemoglobin 8.1, MCV of 88.  RDW was high at 17 and platelet count normal at 334.  Differential was grossly normal except slightly elevated monocytes.  Creatinine was elevated at 1.65.  She was started on iron tablet daily for the past 2 months.  She has slight constipation from it.  She reports bleeding per rectum with bowel movements for the past few months.  Last colonoscopy was in 2017 which showed internal hemorrhoids.  She is reportedly scheduled for EGD and colonoscopy on 02/24/2019.  She reported last blood transfusion in 2009.  She reported that she has been feeling short of breath on exertion, by walking from the car to the office.  Denies any prior history of bone marrow biopsy.  Denies any fevers, night sweats or weight loss.  She has been on Xarelto for atrial fibrillation for the past more than a year.  She was never smoker nonalcoholic.  She used to work in a [employer] many years ago.  Family history significant for no transfusion dependent anemia's.  However father did die of lung cancer and was a smoker.   MEDICAL HISTORY:  Past Medical History:  Diagnosis Date  . Amiodarone toxicity   . Anemia   . Chronic bronchitis   . Chronic systolic CHF (congestive heart failure) ([place])    a. 04/2017 Echo: EF 25%.  . Congenital heart disease   . Diabetes mellitus without complication ([place])    Type II  . Diverticulosis   . Fall due to stumbling October 08, 2013   Due to shoes  . GERD  (gastroesophageal reflux disease)   . Gout   . Hemorrhoids   . Hyperlipidemia   . Hyperparathyroidism   . Hypertension   . Hypothyroidism   . IDA (iron deficiency anemia)   . Microscopic colitis 9/11   Colonoscopy  . NICM (nonischemic cardiomyopathy) - tachycardia induced in the setting of AFib ([place])    a. 04/2017 Echo: EF 25%, basal-mid inferolateral, inf AK, triv AI, mild MR, sev dil LA, mod red RV fxn, mildly dil RA, mild to mod TR, PASP 75mHg.  . Non-obstructive CAD (coronary artery disease)    a. 04/2017 Cath: LM 20p, LCX nl, RCA 20p/m.  .Marland KitchenParoxysmal SVT (supraventricular tachycardia) ([place]   . Persistent atrial fibrillation 06/2013   a. 04/2017 Admit with CHF/NICM/AFib -> CHA2DS2VASc = 4-->Xarelto 15 mg daily started; b. 06/2017 s/p BiV PPM (SJM ser # [id number] and AVN ablation.  . Presence of permanent cardiac pacemaker 06/29/2017   Biventricular pacemaker placed  . PVD (peripheral vascular disease) ([place] 09/2004   Right common femoral endarterectomy in April of 2007  . Renal insufficiency   . Schatzki's ring    Last EGD with esophageal dilatation 53F  9/11  . Scoliosis   . Varicose veins right leg pain and swelling    SURGICAL HISTORY: Past Surgical History:  Procedure Laterality Date  . [facility]  [facility] 89  [facility] N/A 06/29/2017  Procedure: AV NODE ABLATION;  Surgeon: Evans Lance, MD;  Location: [facility] CV LAB;  Service: Cardiovascular;  Laterality: N/A;  . BIOPSY  02/13/2017   Procedure: BIOPSY;  Surgeon: Daneil Dolin, MD;  Location: [facility] END[facility] SU[facility];  Service: Endoscopy;;  duodenum gastric  . BIV PACEMAKER INSERTION CRT-P N/A 06/29/2017   Procedure: BIV PACEMAKER INSERTION CRT-P;  Surgeon: Evans Lance, MD;  Location: [facility] CV LAB;  Service: Cardiovascular;  Laterality: N/A;  . CARDIOVERSION N/A 11/13/2013   Procedure: CARDIOVERSION;  Surgeon: Herminio Commons, MD;  Location: [facility] ORS;  Service:  Endoscopy;  Laterality: N/A;  . COLONOSCOPY  03/04/2010   anal canal hemorrhoids otherwise normal. TI normal. Bx showed lymphocytic colitis. next TCS 02/2020  . COLONOSCOPY  April 2008   [place]: Pancolonic diverticulosis, external hemorrhoids  . COLONOSCOPY N/A 11/30/2015   RMR: Normal terminal ileum for 10 cm, nonbleeding grade 1 internal hemorrhoids, colonic diverticulosis. Next colonoscopy in 2027.  Marland Kitchen ELAS  06-01-11   Right saphenous ELAS   . ESOPHAGEAL DILATION N/A 05/28/2015   Procedure: ESOPHAGEAL DILATION;  Surgeon: Daneil Dolin, MD;  Location: [facility];  Service: Endoscopy;  Laterality: N/A;  . ESOPHAGOGASTRODUODENOSCOPY  03/04/2010   noncritical appearing Schatzki ring. SB bx negative  . ESOPHAGOGASTRODUODENOSCOPY  05/2008   erosive reflux esophagatitis, noncritical Schatzki ring  . ESOPHAGOGASTRODUODENOSCOPY N/A 05/28/2015   SVX:BLTJQZES esophagus somewhat baggy, likely due to underlying esophageal motility disorder/small HH   . ESOPHAGOGASTRODUODENOSCOPY N/A 02/13/2017   Dr. Gala Romney: subtly abnormal stomach of doubtful clinical significant s/p biopsy, normal duodenum s/p biopsy, fundic gland polyp on path, normal duodenal biopsy  . [place]  2010  . Left hemithyroidectomy    . Left parathyroidectomy  2007  . Parathyroid adenoma  2007  . Right common femoral endarterectomy  2007  . RIGHT/LEFT HEART CATH AND CORONARY ANGIOGRAPHY N/A 05/15/2017   Procedure: RIGHT/LEFT HEART CATH AND CORONARY ANGIOGRAPHY;  Surgeon: Troy Sine, MD;  Location: [place] CV LAB;  Service: Cardiovascular;  Laterality: N/A;  . Small bowel capsule  12 2009   Mid to distal small bowel with edema, erosions, tiny ulceration felt to be NSAID related  . TONSILLECTOMY      SOCIAL HISTORY: Social History   Socioeconomic History  . Marital status: Single    Spouse name: Not on file  . Number of children: 0  . Years of education: Not on file  . Highest education level: Not on file   Occupational History  . Occupation: Sales promotion account executive  Social Needs  . Financial resource strain: Not on file  . Food insecurity    Worry: Not on file    Inability: Not on file  . Transportation needs    Medical: Not on file    Non-medical: Not on file  Tobacco Use  . Smoking status: Never Smoker  . Smokeless tobacco: Never Used  . Tobacco comment: Never smoked  Substance and Sexual Activity  . Alcohol use: No    Alcohol/week: 0.0 standard drinks  . Drug use: No  . Sexual activity: Never    Birth control/protection: Post-menopausal  Lifestyle  . Physical activity    Days per week: Not on file    Minutes per session: Not on file  . Stress: Not on file  Relationships  . Social Herbalist on phone: Not on file    Gets together: Not on file    Attends religious service: Not on file  Active member of club or organization: Not on file    Attends meetings of clubs or organizations: Not on file    Relationship status: Not on file  . Intimate partner violence    Fear of current or ex partner: Not on file    Emotionally abused: Not on file    Physically abused: Not on file    Forced sexual activity: Not on file  Other Topics Concern  . Not on file  Social History Narrative  . Not on file    FAMILY HISTORY: Family History  Problem Relation Age of Onset  . Stroke Mother   . Heart disease Mother        CHF  - Amputation-Right Leg  . Hyperlipidemia Mother   . Hypertension Mother   . Congestive Heart Failure Mother   . Lung cancer Father   . Cancer Father        Lung  . Other Brother        back problems  . Heart attack Paternal Grandmother   . Cancer Maternal Grandmother        uterine  . Colon cancer Neg Hx   . Colon polyps Neg Hx     ALLERGIES:  is allergic to flecainide; hydrocodone-acetaminophen; ibuprofen; oxycodone hcl; and penicillins.  MEDICATIONS:  Current Outpatient Medications  Medication Sig Dispense Refill  . acetaminophen (TYLENOL) 500  MG tablet Take 1,000 mg by mouth every 6 (six) hours as needed (for pain.).     Marland Kitchen BREO ELLIPTA 100-25 MCG/INH AEPB Inhale 2 puffs into the lungs daily.    . cyclobenzaprine (FLEXERIL) 5 MG tablet Take 1 tablet by mouth 3 (three) times daily as needed.    . diclofenac sodium (VOLTAREN) 1 % GEL     . ferrous sulfate (FEOSOL) 325 (65 FE) MG tablet Take 325 mg by mouth daily with breakfast.    . furosemide (LASIX) 40 MG tablet Take 1 tablet (40 mg total) by mouth daily. 30 tablet 11  . gabapentin (NEURONTIN) 100 MG capsule Take 100 mg by mouth 2 (two) times daily.     . hydrALAZINE (APRESOLINE) 25 MG tablet Take 0.5 tablets (12.5 mg total) by mouth every 8 (eight) hours. 45 tablet 6  . isosorbide mononitrate (IMDUR) 30 MG 24 hr tablet Take 0.5 tablets (15 mg total) by mouth daily. 30 tablet 3  . Lancets (ONETOUCH DELICA PLUS [id number]) MISC     . levothyroxine (SYNTHROID) 100 MCG tablet Take 1 tablet by mouth daily.    . Lidocaine (HM LIDOCAINE PATCH) 4 % PTCH Apply topically daily. Pt uses OTC lidocaine patch    . loperamide (IMODIUM) 2 MG capsule Take 2-4 mg by mouth 4 (four) times daily as needed for diarrhea or loose stools.    . metoprolol succinate (TOPROL-XL) 50 MG 24 hr tablet TAKE 1 TABLET BY MOUTH ONCE DAILY. 30 tablet 10  . Multiple Vitamin (MULTIVITAMIN) tablet Take 1 tablet by mouth daily.    Glory Rosebush ULTRA test strip     . pantoprazole (PROTONIX) 40 MG tablet Take 40 mg by mouth 2 (two) times daily.     . polyethylene glycol (MIRALAX / GLYCOLAX) packet Take 17 g by mouth daily as needed (for constipation.).     Marland Kitchen potassium chloride (K-DUR) 10 MEQ tablet Take 1 tablet (10 mEq total) by mouth every other day.    . pravastatin (PRAVACHOL) 10 MG tablet Take 10 mg by mouth daily.    . predniSONE (DELTASONE) 10 MG  tablet Take 10 mg by mouth. Take  3 tablets x2 days, 2 tablets x 2 days and 1 x 2 days    . PROAIR HFA 108 (90 Base) MCG/ACT inhaler INHALE 2 PUFFS EVERY 6 HOURS AS NEEDED FOR  SHORTNESS OF BREATH AND WHEEZING. 8.5 g 2  . Probiotic Product (ALIGN PO) Take 1 tablet by mouth daily.     . sitaGLIPtin (JANUVIA) 50 MG tablet Take 50 mg by mouth daily.    . temazepam (RESTORIL) 15 MG capsule Take 15 mg by mouth at bedtime as needed for sleep.     Marland Kitchen tiZANidine (ZANAFLEX) 2 MG tablet Take 2 mg by mouth every 8 (eight) hours as needed.     Alveda Reasons 15 MG TABS tablet TAKE 1 TABLET BY MOUTH ONCE A DAY. 30 tablet 11   No current facility-administered medications for this visit.     REVIEW OF SYSTEMS:   Constitutional: Denies fevers, chills or abnormal night sweats Eyes: Denies blurriness of vision, double vision or watery eyes Ears, nose, mouth, throat, and face: Denies mucositis or sore throat Respiratory: Denies cough, dyspnea or wheezes Cardiovascular: Denies palpitation, chest discomfort.  Positive for leg swelling. Gastrointestinal:  Denies nausea, heartburn or change in bowel habits Skin: Denies abnormal skin rashes Lymphatics: Denies new lymphadenopathy or easy bruising Neurological:Denies numbness, tingling or new weaknesses Behavioral/Psych: Mood is stable, no new changes  All other systems were reviewed with the patient and are negative. -She reported muscle spasms in the back which are recent onset.  PHYSICAL EXAMINATION: ECOG PERFORMANCE STATUS: 1 - Symptomatic but completely ambulatory  Vitals:   01/23/19 1305  BP: 100/60  Pulse: 70  Resp: 18  Temp: 97.8 F (36.6 C)  SpO2: 99%   Filed Weights   01/23/19 1305  Weight: 143 lb (64.9 kg)    GENERAL:alert, no distress and comfortable SKIN: skin color, texture, turgor are normal, no rashes or significant lesions EYES: normal, conjunctiva are pink and non-injected, sclera clear OROPHARYNX:no exudate, no erythema and lips, buccal mucosa, and tongue normal  NECK: supple, thyroid normal size, non-tender, without nodularity LYMPH:  no palpable lymphadenopathy in the cervical, axillary or  inguinal LUNGS: clear to auscultation and percussion with normal breathing effort HEART: regular rate & rhythm and no murmurs and no lower extremity edema ABDOMEN:abdomen soft, non-tender and normal bowel sounds Musculoskeletal:no cyanosis of digits and no clubbing  PSYCH: alert & oriented x 3 with fluent speech NEURO: no focal motor/sensory deficits  LABORATORY DATA:  I have reviewed the data as listed Recent Results (from the past 2160 hour(s))  Novel Coronavirus, NAA ([facility])     Status: None   Collection Time: 12/18/18  2:30 PM   Specimen: Nasal Swab  Result Value Ref Range   SARS-CoV-2, NAA Not Detected Not Detected    Comment: This test was developed and its performance characteristics determined by [employer]. This test has not been FDA cleared or approved. This test has been authorized by FDA under an Emergency Use Authorization (EUA). This test is only authorized for the duration of time the declaration that circumstances exist justifying the authorization of the emergency use of in vitro diagnostic tests for detection of SARS-CoV-2 virus and/or diagnosis of COVID-19 infection under section 564(b)(1) of the Act, 21 U.S.C. 762GBT-5(V)(7), unless the authorization is terminated or revoked sooner. When diagnostic testing is negative, the possibility of a false negative result should be considered in the context of a patient's recent exposures and the presence of clinical  signs and symptoms consistent with COVID-19. An individual without symptoms of COVID-19 and who is not shedding SARS-CoV-2 virus would expect to have a negative (not detected) result in this assay.   CUP PACEART REMOTE DEVICE CHECK     Status: None   Collection Time: 01/13/19 12:59 PM  Result Value Ref Range   Pulse Generator Manufacturer [employer]    Date Time Interrogation Session [id number]    Pulse Gen Model 3262 Quadra Allure MP RF    Pulse Gen Serial Number [id number]    Clinic [facility]    Implantable Pulse Generator Type Cardiac Resynch Therapy Pacemaker    Implantable Pulse Generator Implant Date [id number]    Implantable Lead Manufacturer [facility]    Implantable Lead Model Tendril MRI [id number]    Implantable Lead Serial Number [id number]    Implantable Lead Implant Date [id number]    Implantable Lead Location Detail 1 UNKNOWN    Implantable Lead Location [id number]    Implantable Lead Manufacturer [facility]    Implantable Lead Model 1456q    Implantable Lead Serial Number [id number]    Implantable Lead Implant Date 12458099    Implantable Lead Location Detail 1 UNKNOWN    Implantable Lead Location [id number]     RADIOGRAPHIC STUDIES: I have personally reviewed the radiological images as listed and agreed with the findings in the report.  ASSESSMENT & PLAN:  Normocytic anemia 1.  Normocytic anemia: - Referred by Dr. Wende Neighbors for further management of normocytic anemia. -CBC on 12/26/2018 shows hemoglobin 8.1 with MCV of 88 and normal white count and platelet count. -Patient reports intermittent bleeding per rectum for last several months.  She is reportedly scheduled for another EGD and colonoscopy on 02/24/2019.  Last blood transfusion was in 2009. - EGD on 02/13/2017 shows normal esophagus, subtly abnormal stomach (biopsy fundic gland polyp), normal duodenal bulb and second part of the duodenum. -Colonoscopy on 11/30/2015 showing nonbleeding internal hemorrhoids, colonic diverticulosis. - Etiology includes chronic kidney disease and nutritional deficiencies. -She has been taking iron tablet daily for the past 2 months. -We will check her CBC today, repeat her ferritin, iron panel.  We will also check her I33 and folic acid and copper levels.  We will also check SPEP, LDH and reticulocyte count. - Given her congestive heart failure, she will require optimization of hemoglobin sooner.  If the iron levels are low, have recommended parenteral iron therapy with Feraheme.  We talked  about the side effects and rare chance of allergic reactions.  2.  CHF: - She has a pacemaker.  Ejection fraction was 25%. -We will continue metoprolol XL 100 mg half tablet daily.  She is also on hydralazine 12.5 mg 3 times a day and Imdur 30 mg half tablet daily. -She is on Xarelto 15 mg daily for A. fib.  3.  Diabetic neuropathy: - She will continue gabapentin 100 mg 3 times a day.     All questions were answered. The patient knows to call the clinic with any problems, questions or concerns.      Derek Jack, MD 01/23/19 1:53 PM

## 2019-01-23 NOTE — Addendum Note (Signed)
Addended by: Derek Jack on: 01/23/2019 04:50 PM   Modules accepted: Orders

## 2019-01-24 LAB — HAPTOGLOBIN: Haptoglobin: 141 mg/dL (ref 37–355)

## 2019-01-25 LAB — COPPER, SERUM: Copper: 115 ug/dL (ref 72–166)

## 2019-01-25 LAB — METHYLMALONIC ACID, SERUM: Methylmalonic Acid, Quantitative: 176 nmol/L (ref 0–378)

## 2019-01-27 LAB — PROTEIN ELECTROPHORESIS, SERUM
A/G Ratio: 1.3 (ref 0.7–1.7)
Albumin ELP: 3.5 g/dL (ref 2.9–4.4)
Alpha-1-Globulin: 0.3 g/dL (ref 0.0–0.4)
Alpha-2-Globulin: 0.7 g/dL (ref 0.4–1.0)
Beta Globulin: 0.9 g/dL (ref 0.7–1.3)
Gamma Globulin: 0.9 g/dL (ref 0.4–1.8)
Globulin, Total: 2.8 g/dL (ref 2.2–3.9)
Total Protein ELP: 6.3 g/dL (ref 6.0–8.5)

## 2019-01-27 NOTE — Telephone Encounter (Signed)
Spoke with patient. She states she is still on iron. Aware she will need to stop this 7 days prior to procedure. New instructions mailed to her (confirmed address).

## 2019-01-28 ENCOUNTER — Ambulatory Visit: Payer: PPO | Admitting: Gastroenterology

## 2019-01-29 ENCOUNTER — Encounter: Payer: Self-pay | Admitting: Cardiology

## 2019-01-29 NOTE — Progress Notes (Signed)
Remote pacemaker transmission.   

## 2019-02-03 ENCOUNTER — Inpatient Hospital Stay (HOSPITAL_COMMUNITY): Payer: PPO | Attending: Hematology

## 2019-02-03 ENCOUNTER — Other Ambulatory Visit: Payer: Self-pay

## 2019-02-03 ENCOUNTER — Encounter (HOSPITAL_COMMUNITY): Payer: Self-pay

## 2019-02-03 VITALS — BP 105/78 | HR 70 | Temp 97.1°F | Resp 18

## 2019-02-03 DIAGNOSIS — N189 Chronic kidney disease, unspecified: Secondary | ICD-10-CM | POA: Diagnosis not present

## 2019-02-03 DIAGNOSIS — D509 Iron deficiency anemia, unspecified: Secondary | ICD-10-CM

## 2019-02-03 DIAGNOSIS — E611 Iron deficiency: Secondary | ICD-10-CM | POA: Diagnosis not present

## 2019-02-03 DIAGNOSIS — D631 Anemia in chronic kidney disease: Secondary | ICD-10-CM | POA: Diagnosis not present

## 2019-02-03 MED ORDER — SODIUM CHLORIDE 0.9 % IV SOLN
510.0000 mg | Freq: Once | INTRAVENOUS | Status: AC
  Administered 2019-02-03: 13:00:00 510 mg via INTRAVENOUS
  Filled 2019-02-03: qty 510

## 2019-02-03 MED ORDER — SODIUM CHLORIDE 0.9 % IV SOLN
Freq: Once | INTRAVENOUS | Status: AC
  Administered 2019-02-03: 13:00:00 via INTRAVENOUS

## 2019-02-03 NOTE — Progress Notes (Signed)
Peripheral IV site checked with positive blood return noted prior to and after infusion 

## 2019-02-03 NOTE — Patient Instructions (Signed)
[facility] at [facility] Discharge Instructions  Received Feraheme infusion today. Follow-up as scheduled. Call clinic for any questions or concerns   Thank you for choosing [facility] at [facility] to provide your oncology and hematology care.  To afford each patient quality time with our provider, please arrive at least 15 minutes before your scheduled appointment time.   If you have a lab appointment with the Cancer Center please come in thru the  Main Entrance and check in at the main information desk  You need to re-schedule your appointment should you arrive 10 or more minutes late.  We strive to give you quality time with our providers, and arriving late affects you and other patients whose appointments are after yours.  Also, if you no show three or more times for appointments you may be dismissed from the clinic at the providers discretion.     Again, thank you for choosing [facility].  Our hope is that these requests will decrease the amount of time that you wait before being seen by our physicians.       _____________________________________________________________  Should you have questions after your visit to [facility], please contact our office at [phone] between the hours of 8:00 a.m. and 4:30 p.m.  Voicemails left after 4:00 p.m. will not be returned until the following business day.  For prescription refill requests, have your pharmacy contact our office and allow 72 hours.    Cancer Center Support Programs:   > Cancer Support Group  2nd Tuesday of the month 1pm-2pm, Journey Room   

## 2019-02-03 NOTE — Progress Notes (Signed)
[name] tolerated Feraheme infusion well without complaints or incident. VSS upon discharge. Pt discharged self ambulatory in satisfactory condition

## 2019-02-05 DIAGNOSIS — G4700 Insomnia, unspecified: Secondary | ICD-10-CM | POA: Diagnosis not present

## 2019-02-05 DIAGNOSIS — M62830 Muscle spasm of back: Secondary | ICD-10-CM | POA: Diagnosis not present

## 2019-02-10 ENCOUNTER — Inpatient Hospital Stay (HOSPITAL_COMMUNITY): Payer: PPO

## 2019-02-10 ENCOUNTER — Encounter (HOSPITAL_COMMUNITY): Payer: Self-pay

## 2019-02-10 ENCOUNTER — Other Ambulatory Visit: Payer: Self-pay

## 2019-02-10 ENCOUNTER — Ambulatory Visit (HOSPITAL_COMMUNITY): Payer: PPO

## 2019-02-10 VITALS — BP 111/70 | HR 70 | Temp 97.1°F | Resp 16

## 2019-02-10 DIAGNOSIS — D509 Iron deficiency anemia, unspecified: Secondary | ICD-10-CM

## 2019-02-10 DIAGNOSIS — N189 Chronic kidney disease, unspecified: Secondary | ICD-10-CM | POA: Diagnosis not present

## 2019-02-10 MED ORDER — SODIUM CHLORIDE 0.9 % IV SOLN
510.0000 mg | Freq: Once | INTRAVENOUS | Status: AC
  Administered 2019-02-10: 09:00:00 510 mg via INTRAVENOUS
  Filled 2019-02-10: qty 510

## 2019-02-10 MED ORDER — SODIUM CHLORIDE 0.9 % IV SOLN
Freq: Once | INTRAVENOUS | Status: AC
  Administered 2019-02-10: 09:00:00 via INTRAVENOUS

## 2019-02-10 NOTE — Progress Notes (Signed)
Feraheme given per orders. Patient tolerated it well without problems. Vitals stable and discharged home from clinic ambulatory. Follow up as scheduled.  

## 2019-02-11 NOTE — Telephone Encounter (Signed)
In July 2019 Hgb was 12.6, Hct 39.2, no iron studies. Nov 2019 Hgb stable at 13. Ferritin 49, iron 51. June 2020 Hgb down to 10.1, Hct 33.6, Creatinine 1.82, BUN 38, ferritin down to 22, iron 23, iron sats 6%.   July 2020: Hgb 8.1, Hct 28.3, microcytic anemia, Creatinine 1.65, BUN 20, iron 363 (HIGH), iron sats 94% (high), ferritin 43. Hgb has continued to drift over the past year, down 4 grams from last year. Ferritin improved from June. CKD playing a role in anemia. At this point, anemia multifactorial in setting of chronic disease and IDA.

## 2019-02-13 ENCOUNTER — Other Ambulatory Visit: Payer: Self-pay

## 2019-02-13 ENCOUNTER — Ambulatory Visit (HOSPITAL_COMMUNITY): Payer: PPO | Attending: Internal Medicine

## 2019-02-13 ENCOUNTER — Encounter (HOSPITAL_COMMUNITY): Payer: Self-pay

## 2019-02-13 DIAGNOSIS — M545 Low back pain, unspecified: Secondary | ICD-10-CM

## 2019-02-13 DIAGNOSIS — R293 Abnormal posture: Secondary | ICD-10-CM | POA: Insufficient documentation

## 2019-02-13 DIAGNOSIS — M6281 Muscle weakness (generalized): Secondary | ICD-10-CM | POA: Diagnosis not present

## 2019-02-13 DIAGNOSIS — R2689 Other abnormalities of gait and mobility: Secondary | ICD-10-CM | POA: Insufficient documentation

## 2019-02-13 NOTE — Therapy (Signed)
[place], [place], [address] Phone: [phone]   Fax:  [phone]  Physical Therapy Evaluation  Patient Details  Name: [name] MRN: [id number] Date of Birth: 1956-02-24 Referring Provider (PT): Allyn Kenner, MD   Encounter Date: 02/13/2019  PT End of Session - 02/13/19 1104    Visit Number  1    Number of Visits  8    Date for PT Re-Evaluation  02/28/19    Authorization Type  [employer]   Secondary: Medicaid   Authorization Time Period  02/13/19 to 02/28/19    Authorization - Visit Number  1    Authorization - Number of Visits  10    PT Start Time  [id number]    PT Stop Time  1100    PT Time Calculation (min)  45 min    Activity Tolerance  Patient tolerated treatment well    Behavior During Therapy  [facility] for tasks assessed/performed       Past Medical History:  Diagnosis Date  . Amiodarone toxicity   . Anemia   . Chronic bronchitis   . Chronic systolic CHF (congestive heart failure) ([place])    a. 04/2017 Echo: EF 25%.  . Congenital heart disease   . Diabetes mellitus without complication ([place])    Type II  . Diverticulosis   . Fall due to stumbling October 08, 2013   Due to shoes  . GERD (gastroesophageal reflux disease)   . Gout   . Hemorrhoids   . Hyperlipidemia   . Hyperparathyroidism   . Hypertension   . Hypothyroidism   . IDA (iron deficiency anemia)   . Microscopic colitis 9/11   Colonoscopy  . NICM (nonischemic cardiomyopathy) - tachycardia induced in the setting of AFib ([place])    a. 04/2017 Echo: EF 25%, basal-mid inferolateral, inf AK, triv AI, mild MR, sev dil LA, mod red RV fxn, mildly dil RA, mild to mod TR, PASP 28mmHg.  . Non-obstructive CAD (coronary artery disease)    a. 04/2017 Cath: LM 20p, LCX nl, RCA 20p/m.  Marland Kitchen Paroxysmal SVT (supraventricular tachycardia) ([place])   . Persistent atrial fibrillation 06/2013   a. 04/2017 Admit with CHF/NICM/AFib -> CHA2DS2VASc =  4-->Xarelto 15 mg daily started; b. 06/2017 s/p BiV PPM (SJM ser # [id number]) and AVN ablation.  . Presence of permanent cardiac pacemaker 06/29/2017   Biventricular pacemaker placed  . PVD (peripheral vascular disease) ([place]) 09/2004   Right common femoral endarterectomy in April of 2007  . Renal insufficiency   . Schatzki's ring    Last EGD with esophageal dilatation 37F  9/11  . Scoliosis   . Varicose veins right leg pain and swelling    Past Surgical History:  Procedure Laterality Date  . ASD REPAIR  Age 89   [facility]  . AV NODE ABLATION N/A 06/29/2017   Procedure: AV NODE ABLATION;  Surgeon: Evans Lance, MD;  Location: [facility];  Service: Cardiovascular;  Laterality: N/A;  . BIOPSY  02/13/2017   Procedure: BIOPSY;  Surgeon: Daneil Dolin, MD;  [facility];  Service: Endoscopy;;  duodenum gastric  . BIV PACEMAKER INSERTION CRT-P N/A 06/29/2017   Procedure: BIV PACEMAKER INSERTION CRT-P;  Surgeon: Evans Lance, MD;  Location: [facility];  Service: Cardiovascular;  Laterality: N/A;  . CARDIOVERSION N/A 11/13/2013   Procedure: CARDIOVERSION;  Surgeon: Herminio Commons, MD;  Location: [facility] ORS;  Service: Endoscopy;  Laterality: N/A;  . COLONOSCOPY  03/04/2010   anal canal hemorrhoids otherwise normal. TI normal. Bx showed lymphocytic colitis. next TCS 02/2020  . COLONOSCOPY  April 2008   Rehman: Pancolonic diverticulosis, external hemorrhoids  . COLONOSCOPY N/A 11/30/2015   RMR: Normal terminal ileum for 10 cm, nonbleeding grade 1 internal hemorrhoids, colonic diverticulosis. Next colonoscopy in 2027.  Marland Kitchen ELAS  06-01-11   Right saphenous ELAS   . ESOPHAGEAL DILATION N/A 05/28/2015   Procedure: ESOPHAGEAL DILATION;  Surgeon: Daneil Dolin, MD;  [facility];  Service: Endoscopy;  Laterality: N/A;  . ESOPHAGOGASTRODUODENOSCOPY  03/04/2010   noncritical appearing Schatzki ring. SB bx negative  .  ESOPHAGOGASTRODUODENOSCOPY  05/2008   erosive reflux esophagatitis, noncritical Schatzki ring  . ESOPHAGOGASTRODUODENOSCOPY N/A 05/28/2015   [id number] esophagus somewhat baggy, likely due to underlying esophageal motility disorder/small HH   . ESOPHAGOGASTRODUODENOSCOPY N/A 02/13/2017   Dr. Gala Romney: subtly abnormal stomach of doubtful clinical significant s/p biopsy, normal duodenum s/p biopsy, fundic gland polyp on path, normal duodenal biopsy  . [place]  2010  . Left hemithyroidectomy    . Left parathyroidectomy  2007  . Parathyroid adenoma  2007  . Right common femoral endarterectomy  2007  . RIGHT/LEFT HEART CATH AND CORONARY ANGIOGRAPHY N/A 05/15/2017   Procedure: RIGHT/LEFT HEART CATH AND CORONARY ANGIOGRAPHY;  Surgeon: Troy Sine, MD;  Location: [facility];  Service: Cardiovascular;  Laterality: N/A;  . Small bowel capsule  12 2009   Mid to distal small bowel with edema, erosions, tiny ulceration felt to be NSAID related  . TONSILLECTOMY      There were no vitals filed for this visit.   Subjective Assessment - 02/13/19 1018    Subjective  Pt reports 2-3 weeks ago her low back was hurting so bad, going back and forth form R to L, pain under R ribcage and she thought she had a kidney infection or gallbladder issue, but everything was negative at doctor. Pt denies any falls over last year, changes in bowel and bladder, or shooting pains down legs. Pt reports a fall in apartment 2 years ago where she landed on her R side, tripped over chair, but didn't go to doctor to be checked after the fall. Pt reports L leg cramp last week circumferentially, but relieved with rest and propping up. Pt reports difficulty and increased pain with reaching overhead and prolonged sitting, but isn't as bad now. Pt reports R and L side hurt equally throughout low back. Pt denies use of SPC or RW. Pt reports 10/10 pain at worst, normally 1/10 pain. Pt reports prescribed prednisone for pain  and inflammation, but didn't notice any improvements. Pt reports doing same activities, just requires rest breaks. Pt reports gnawing pain at R side/under breast that initially occurred when her back hurt, but it no longer bothers her; denies changes diet or weightloss, but reports some bile in stools over the past 2 days and educated pt to reach out to MD for further gallbladder work-up.    Pertinent History  scoliosis, pacemaker    Limitations  Sitting;House hold activities    How long can you sit comfortably?  30 minutes    How long can you stand comfortably?  no issues    How long can you walk comfortably?  no issues    Diagnostic tests  none    Patient Stated Goals  move around and do what I can normally do without any pain  Currently in Pain?  Yes    Pain Score  5     Pain Location  Back    Pain Orientation  Lower    Pain Descriptors / Indicators  Spasm    Pain Type  Acute pain    Pain Onset  1 to 4 weeks ago    Pain Frequency  Constant    Aggravating Factors   sitting, reaching overhead    Pain Relieving Factors  ice and heat    Effect of Pain on Daily Activities  moderately limited         [facility] PT Assessment - 02/13/19 0001      Assessment   Medical Diagnosis  Back pain    Referring Provider (PT)  Allyn Kenner, MD    Onset Date/Surgical Date  --   approximately 2-3 weeks ago   Next MD Visit  no f/u scheduled    Prior Therapy  None      Precautions   Precautions  None      Restrictions   Weight Bearing Restrictions  No      Balance Screen   Has the patient fallen in the past 6 months  No    Has the patient had a decrease in activity level because of a fear of falling?   Yes    Is the patient reluctant to leave their home because of a fear of falling?   No      Prior Function   Level of Independence  Independent    Vocation  Other (comment)   let go 2 years ago and hasn't been able to find a job   Leisure  walk around stores, puzzle books      Observation/Other  Assessments   Focus on Therapeutic Outcomes (FOTO)   33% limited      Sensation   Light Touch  Appears Intact      Functional Tests   Functional tests  Sit to Stand;Single leg stance      Single Leg Stance   Comments  bil hip drop noted      Sit to Stand   Comments  5x STS: 20 seconds, from chair, no UE assist      ROM / Strength   AROM / PROM / Strength  AROM;Strength      AROM   AROM Assessment Site  Lumbar    Lumbar Flexion  WFL    Lumbar Extension  25% limited    Lumbar - Right Side Bend  50% limited, painful    Lumbar - Left Side Bend  50% limited, painful    Lumbar - Right Rotation  75% limited, painful    Lumbar - Left Rotation  75% limited, painful      Strength   Overall Strength Comments  Pt tends to lean/compensate demonstrating core weakness    Strength Assessment Site  Hip;Knee;Ankle    Right Hip Flexion  3+/5    Right Hip Extension  3-/5    Right Hip ABduction  3+/5    Left Hip Flexion  3+/5    Left Hip Extension  3-/5    Left Hip ABduction  3+/5    Right Knee Flexion  4/5    Right Knee Extension  4+/5    Left Knee Flexion  4/5    Left Knee Extension  4+/5    Right Ankle Dorsiflexion  4/5    Left Ankle Dorsiflexion  4/5      Flexibility   Soft  Tissue Assessment /Muscle Length  yes    Hamstrings  WNL bil    Quadriceps  Ely's: + bil for tightness, knees to 90deg bil    ITB  WNL bil      Palpation   SI assessment   denies tenderness to palpation    Palpation comment  palpable restrictions noted with tenderness throughout mid-lower thoracic and lumbar paraspinals      Special Tests    Special Tests  Lumbar    Lumbar Tests  Straight Leg Raise      Straight Leg Raise   Findings  Negative    Comment  bil      Ambulation/Gait   Ambulation/Gait  Yes    Ambulation/Gait Assistance  6: Modified independent (Device/Increase time)    Ambulation Distance (Feet)  452 Feet    Assistive device  None    Gait Pattern  Step-through pattern;Decreased arm  swing - right;Decreased weight shift to left;Trunk rotated posteriorly on right   occasional shuffling bilaterally   Gait velocity  0.76 m/s    Gait Comments  3MWT      Balance   Balance Assessed  Yes      Static Standing Balance   Static Standing - Balance Support  No upper extremity supported    Static Standing Balance -  Activities   Single Leg Stance - Right Leg;Single Leg Stance - Left Leg;Tandam Stance - Right Leg;Tandam Stance - Left Leg    Static Standing - Comment/# of Minutes  SLS: <1 sec bil; tandem stance: <1 sec bil                Objective measurements completed on examination: See above findings.              PT Education - 02/13/19 1110    Education Details  Assessment findings, POC, reaching out to PCP regarding her concerns about gallbladder due to bile in stool over past 2 days    Person(s) Educated  Patient    Methods  Explanation    Comprehension  Verbalized understanding       PT Short Term Goals - 02/13/19 1121      PT SHORT TERM GOAL #1   Title  Pt will be independent with HEP, perform consistently with correct form and update PRN.    Time  2    Period  Weeks    Status  New    Target Date  02/27/19        PT Long Term Goals - 02/13/19 1121      PT LONG TERM GOAL #1   Title  Pt will perform SLS and tandem stance for 15 sec or > bilaterally to reduce risk for falls.    Time  4    Period  Weeks    Status  New    Target Date  03/13/19      PT LONG TERM GOAL #2   Title  Pt will perform 5x STS in 11.4 seec or < to demonstrate reduced pain with functional activities and return to PLOF.    Time  4    Period  Weeks    Status  New      PT LONG TERM GOAL #3   Title  Pt will complete 3MWT with gait speed of 0.50m/s or > to demo improved gait mechanics, functional strength, and reduce risk for falls.    Time  4    Period  Weeks    Status  New      PT LONG TERM GOAL #4   Title  Pt will improve overall BLE strength by 1 MMT grade  to improve gait mechanics and ability to perform household chores.    Time  4    Period  Weeks    Status  New      PT LONG TERM GOAL #5   Title  Pt will self report 0/10 pain at worst with funcitonal activities around the house to improve QoL and return to PLOF.    Time  4    Period  Weeks    Status  New             Plan - 02/13/19 1111    Clinical Impression Statement  Pt is a pleasant 63 YO female with low back pain complaints. Upon evaluation, pt demonstrates tenderness throughout mid-lower thoracic and lumbar paraspinals, both sides being equal. Subjectively, pt with complaints suggesting muscle spasm that has begun resolving since initial onset, but still limits pt with mobility. Pt with scoliosis curve present and diagnosed in her 63s. Objectively, pt demonstrates weakness throughout BLE and core, deficits in lumbar AROM, decreased transfer performance, impaired balance, impaired gait mechanics and reduced gait speed all increasing difficulty to perform functional activities and increasing pain at home. Pt would benefit from skilled PT to improve deficits noted, reduce pain and return to PLOF.    Personal Factors and Comorbidities  Comorbidity 3+;Time since onset of injury/illness/exacerbation    Comorbidities  see above    Examination-Activity Limitations  Reach Overhead;Sit    Examination-Participation Restrictions  Cleaning;Driving    Stability/Clinical Decision Making  Stable/Uncomplicated    Clinical Decision Making  Low    Rehab Potential  Good    PT Frequency  2x / week    PT Duration  4 weeks    PT Treatment/Interventions  ADLs/Self Care Home Management;Aquatic Therapy;Biofeedback;Cryotherapy;Moist Heat;DME Instruction;Gait training;Stair training;Functional mobility training;Therapeutic activities;Therapeutic exercise;Balance training;Neuromuscular re-education;Patient/family education;Orthotic Fit/Training;Manual techniques;Passive range of motion;Dry  needling;Taping;Joint Manipulations    PT Next Visit Plan  Review goals, DGI, initiate HEP. Begin core strengthening, stretching throughout core/hips, BLE strengthening. Balance exercises and gait/endurance training.    PT Home Exercise Plan  Initiate next visit.    Consulted and Agree with Plan of Care  Patient       Patient will benefit from skilled therapeutic intervention in order to improve the following deficits and impairments:  Abnormal gait, Cardiopulmonary status limiting activity, Decreased activity tolerance, Decreased balance, Decreased endurance, Decreased range of motion, Decreased strength, Hypomobility, Increased fascial restricitons, Increased muscle spasms, Impaired perceived functional ability, Impaired flexibility, Improper body mechanics, Postural dysfunction, Pain  Visit Diagnosis: Acute bilateral low back pain without sciatica - Plan: PT plan of care cert/re-cert  Abnormal posture - Plan: PT plan of care cert/re-cert  Other abnormalities of gait and mobility - Plan: PT plan of care cert/re-cert  Muscle weakness (generalized) - Plan: PT plan of care cert/re-cert     Problem List Patient Active Problem List   Diagnosis Date Noted  . Atrial fibrillation with rapid ventricular response ([place]) 06/29/2017  . Acute bronchitis 05/24/2017  . CAD (coronary artery disease) 05/17/2017  . Elevated troponin   . Acute systolic heart failure ([place])   . Non-ST elevation (NSTEMI) myocardial infarction ([place])   . Non-ischemic cardiomyopathy ([place])   . Acute on chronic combined systolic and diastolic CHF (congestive heart failure) ([place]) 05/10/2017  . Persistent atrial fibrillation 05/09/2017  . LLQ pain 01/04/2017  .  ILD (interstitial lung disease) ([place]) 11/21/2016  . Hemorrhoids 08/21/2016  . IDA (iron deficiency anemia) 01/11/2016  . Diverticulosis of colon without hemorrhage   . Abdominal pain, right upper quadrant 10/29/2015  . Rectal bleeding 10/29/2015  . Normocytic  anemia 10/29/2015  . Dysphagia   . Abnormality of esophagus 04/29/2015  . Esophageal dysphagia 03/26/2015  . OSA (obstructive sleep apnea) 12/31/2013  . Atrial fibrillation with RVR ([place]) 11/09/2013  . CKD (chronic kidney disease), stage III ([place]) 11/09/2013  . Hyperkalemia 11/07/2013  . Diastolic dysfunction [id number]  . DM (diabetes mellitus) ([place]) 11/07/2013  . Varicose veins of lower extremities with ulcer ([place]) 10/17/2013  . Cold-Left Hand / Foot 10/08/2013  . Circulation problem-Left foot 10/08/2013  . Atherosclerosis of native arteries of the extremities with ulceration(440.23) 10/08/2013  . A-fib ([place]) 08/05/2013  . Constipation 04/11/2013  . Peripheral vascular disease, unspecified ([place]) 10/27/2011  . Varicose veins of bilateral lower extremities with other complications 0000000  . COLLAGENOUS COLITIS 04/08/2010  . GERD 02/18/2010  . HYPERTHYROIDISM 02/11/2010      Talbot Grumbling PT, DPT 02/13/19, 12:21 PM [place] [address] [place], [place], [address] Phone: [phone]   Fax:  [phone]  Name: [name] MRN: [id number] Date of Birth: 1955/09/18

## 2019-02-17 ENCOUNTER — Encounter

## 2019-02-17 ENCOUNTER — Telehealth (HOSPITAL_COMMUNITY): Payer: Self-pay | Admitting: Internal Medicine

## 2019-02-17 NOTE — Telephone Encounter (Signed)
02/17/19  Left patient a message to see if she wanted to come in Wed., 8/26 at 115 on tori's schedule.  Patient is on the wait list

## 2019-02-18 ENCOUNTER — Ambulatory Visit (HOSPITAL_COMMUNITY): Payer: PPO

## 2019-02-18 ENCOUNTER — Other Ambulatory Visit: Payer: Self-pay

## 2019-02-18 ENCOUNTER — Encounter (HOSPITAL_COMMUNITY): Payer: Self-pay | Admitting: Hematology

## 2019-02-18 ENCOUNTER — Inpatient Hospital Stay (HOSPITAL_BASED_OUTPATIENT_CLINIC_OR_DEPARTMENT_OTHER): Payer: PPO | Admitting: Hematology

## 2019-02-18 ENCOUNTER — Telehealth: Payer: Self-pay | Admitting: *Deleted

## 2019-02-18 VITALS — BP 111/64 | HR 70 | Temp 98.6°F | Resp 18 | Wt 143.2 lb

## 2019-02-18 DIAGNOSIS — D649 Anemia, unspecified: Secondary | ICD-10-CM | POA: Diagnosis not present

## 2019-02-18 DIAGNOSIS — D509 Iron deficiency anemia, unspecified: Secondary | ICD-10-CM | POA: Diagnosis not present

## 2019-02-18 DIAGNOSIS — N189 Chronic kidney disease, unspecified: Secondary | ICD-10-CM | POA: Diagnosis not present

## 2019-02-18 NOTE — Assessment & Plan Note (Addendum)
1.  Normocytic anemia: - Etiology is CKD and iron deficiency. - Colonoscopy on 11/30/2015 showing nonbleeding internal hemorrhoids, colonic diverticulosis. -EGD on 02/13/2017 shows normal esophagus, slightly abnormal stomach, normal duodenal bulb and second part of duodenum. - We discussed results of her blood work.  Ferritin was low at 21.  [id number], folic acid, copper, LDH levels were normal.  SPEP was negative. - She received Feraheme on 02/03/2019 and 02/10/2019. -She is starting to feel better in terms of energy.  I have recommended checking her CBC tomorrow. -I plan to see her back in 6 weeks for follow-up.  I will plan to repeat her ferritin, iron panel and a CBC at that time.  If there is no improvement with adequate iron stores, will consider erythropoiesis stimulating agents.  2.  CHF: -She had pacemaker.  Ejection fraction is 25%. - She is on metoprolol XL 100 mg half tablet daily, hydralazine 12.5 mg 3 times daily, Imdur 30 mg half tablet daily.  She is also on Xarelto daily for A. fib.  3.  Diabetic neuropathy: -She will continue gabapentin 100 mg 3 times a day.

## 2019-02-18 NOTE — Telephone Encounter (Signed)
Pt has procedure on 02/24/2019.  Pt said she is supposed to stop her Xarelto 48 hours prior to procedure.  She said she is concerned because she has a pacemaker.  [phone]

## 2019-02-18 NOTE — Patient Instructions (Signed)
[facility] at [facility] Discharge Instructions  You were seen today by Dr. Katragadda. He went over your recent lab results. He will see you back in 6 weeks for labs and follow up.   Thank you for choosing [facility] at [facility] to provide your oncology and hematology care.  To afford each patient quality time with our provider, please arrive at least 15 minutes before your scheduled appointment time.   If you have a lab appointment with the Cancer Center please come in thru the  Main Entrance and check in at the main information desk  You need to re-schedule your appointment should you arrive 10 or more minutes late.  We strive to give you quality time with our providers, and arriving late affects you and other patients whose appointments are after yours.  Also, if you no show three or more times for appointments you may be dismissed from the clinic at the providers discretion.     Again, thank you for choosing [facility].  Our hope is that these requests will decrease the amount of time that you wait before being seen by our physicians.       _____________________________________________________________  Should you have questions after your visit to [facility], please contact our office at [phone] between the hours of 8:00 a.m. and 4:30 p.m.  Voicemails left after 4:00 p.m. will not be returned until the following business day.  For prescription refill requests, have your pharmacy contact our office and allow 72 hours.    Cancer Center Support Programs:   > Cancer Support Group  2nd Tuesday of the month 1pm-2pm, Journey Room    

## 2019-02-18 NOTE — Progress Notes (Signed)
[place], [place] [address]   CLINIC:  Medical Oncology/Hematology  PCP:  Celene Squibb, MD [place] [address] [phone]   REASON FOR VISIT:  Follow-up for normocytic anemia.  CURRENT THERAPY: Intermittent iron therapy.   INTERVAL HISTORY:  Ms. Larose 63 y.o. female seen for follow-up of normocytic anemia.  Denies any bleeding per rectum or melena.  Shortness of breath on exertion is better.  Appetite and energy levels are rated at 75%.  Pain is reported as 0.  She did receive Feraheme infusions on 02/10/2019.  She has felt improvement in her energy levels and functional status.  Denies any fevers or infections.    REVIEW OF SYSTEMS:  Review of Systems  Respiratory: Positive for shortness of breath.   All other systems reviewed and are negative.    PAST MEDICAL/SURGICAL HISTORY:  Past Medical History:  Diagnosis Date  . Amiodarone toxicity   . Anemia   . Chronic bronchitis   . Chronic systolic CHF (congestive heart failure) ([place])    a. 04/2017 Echo: EF 25%.  . Congenital heart disease   . Diabetes mellitus without complication ([place])    Type II  . Diverticulosis   . Fall due to stumbling October 08, 2013   Due to shoes  . GERD (gastroesophageal reflux disease)   . Gout   . Hemorrhoids   . Hyperlipidemia   . Hyperparathyroidism   . Hypertension   . Hypothyroidism   . IDA (iron deficiency anemia)   . Microscopic colitis 9/11   Colonoscopy  . NICM (nonischemic cardiomyopathy) - tachycardia induced in the setting of AFib ([place])    a. 04/2017 Echo: EF 25%, basal-mid inferolateral, inf AK, triv AI, mild MR, sev dil LA, mod red RV fxn, mildly dil RA, mild to mod TR, PASP 81mmHg.  . Non-obstructive CAD (coronary artery disease)    a. 04/2017 Cath: LM 20p, LCX nl, RCA 20p/m.  Marland Kitchen Paroxysmal SVT (supraventricular tachycardia) ([place])   . Persistent atrial fibrillation 06/2013   a. 04/2017 Admit with  CHF/NICM/AFib -> CHA2DS2VASc = 4-->Xarelto 15 mg daily started; b. 06/2017 s/p BiV PPM (SJM ser # [id number]) and AVN ablation.  . Presence of permanent cardiac pacemaker 06/29/2017   Biventricular pacemaker placed  . PVD (peripheral vascular disease) ([place]) 09/2004   Right common femoral endarterectomy in April of 2007  . Renal insufficiency   . Schatzki's ring    Last EGD with esophageal dilatation 9F  9/11  . Scoliosis   . Varicose veins right leg pain and swelling   Past Surgical History:  Procedure Laterality Date  . ASD REPAIR  Age 75   [facility]  . AV NODE ABLATION N/A 06/29/2017   Procedure: AV NODE ABLATION;  Surgeon: Evans Lance, MD;  Location: [facility];  Service: Cardiovascular;  Laterality: N/A;  . BIOPSY  02/13/2017   Procedure: BIOPSY;  Surgeon: Daneil Dolin, MD;  [facility];  Service: Endoscopy;;  duodenum gastric  . BIV PACEMAKER INSERTION CRT-P N/A 06/29/2017   Procedure: BIV PACEMAKER INSERTION CRT-P;  Surgeon: Evans Lance, MD;  Location: [facility];  Service: Cardiovascular;  Laterality: N/A;  . CARDIOVERSION N/A 11/13/2013   Procedure: CARDIOVERSION;  Surgeon: Herminio Commons, MD;  Location: [facility];  Service: Endoscopy;  Laterality: N/A;  . COLONOSCOPY  03/04/2010   anal canal hemorrhoids otherwise normal. TI normal. Bx  showed lymphocytic colitis. next TCS 02/2020  . COLONOSCOPY  April 2008   [place]: Pancolonic diverticulosis, external hemorrhoids  . COLONOSCOPY N/A 11/30/2015   RMR: Normal terminal ileum for 10 cm, nonbleeding grade 1 internal hemorrhoids, colonic diverticulosis. Next colonoscopy in 2027.  Marland Kitchen ELAS  06-01-11   Right saphenous ELAS   . ESOPHAGEAL DILATION N/A 05/28/2015   Procedure: ESOPHAGEAL DILATION;  Surgeon: Daneil Dolin, MD;  [facility];  Service: Endoscopy;  Laterality: N/A;  . ESOPHAGOGASTRODUODENOSCOPY  03/04/2010   noncritical appearing Schatzki ring. SB bx  negative  . ESOPHAGOGASTRODUODENOSCOPY  05/2008   erosive reflux esophagatitis, noncritical Schatzki ring  . ESOPHAGOGASTRODUODENOSCOPY N/A 05/28/2015   [id number] esophagus somewhat baggy, likely due to underlying esophageal motility disorder/small HH   . ESOPHAGOGASTRODUODENOSCOPY N/A 02/13/2017   Dr. Gala Romney: subtly abnormal stomach of doubtful clinical significant s/p biopsy, normal duodenum s/p biopsy, fundic gland polyp on path, normal duodenal biopsy  . [place]  2010  . Left hemithyroidectomy    . Left parathyroidectomy  2007  . Parathyroid adenoma  2007  . Right common femoral endarterectomy  2007  . RIGHT/LEFT HEART CATH AND CORONARY ANGIOGRAPHY N/A 05/15/2017   Procedure: RIGHT/LEFT HEART CATH AND CORONARY ANGIOGRAPHY;  Surgeon: Troy Sine, MD;  Location: [facility];  Service: Cardiovascular;  Laterality: N/A;  . Small bowel capsule  12 2009   Mid to distal small bowel with edema, erosions, tiny ulceration felt to be NSAID related  . TONSILLECTOMY       SOCIAL HISTORY:  Social History   Socioeconomic History  . Marital status: Single    Spouse name: Not on file  . Number of children: 0  . Years of education: Not on file  . Highest education level: Not on file  Occupational History  . Occupation: Sales promotion account executive  Social Needs  . Financial resource strain: Not on file  . Food insecurity    Worry: Not on file    Inability: Not on file  . Transportation needs    Medical: Not on file    Non-medical: Not on file  Tobacco Use  . Smoking status: Never Smoker  . Smokeless tobacco: Never Used  . Tobacco comment: Never smoked  Substance and Sexual Activity  . Alcohol use: No    Alcohol/week: 0.0 standard drinks  . Drug use: No  . Sexual activity: Never    Birth control/protection: Post-menopausal  Lifestyle  . Physical activity    Days per week: Not on file    Minutes per session: Not on file  . Stress: Not on file  Relationships  . Social  Herbalist on phone: Not on file    Gets together: Not on file    Attends religious service: Not on file    Active member of club or organization: Not on file    Attends meetings of clubs or organizations: Not on file    Relationship status: Not on file  . Intimate partner violence    Fear of current or ex partner: Not on file    Emotionally abused: Not on file    Physically abused: Not on file    Forced sexual activity: Not on file  Other Topics Concern  . Not on file  Social History Narrative  . Not on file    FAMILY HISTORY:  Family History  Problem Relation Age of Onset  . Stroke Mother   . Heart disease Mother  CHF  - Amputation-Right Leg  . Hyperlipidemia Mother   . Hypertension Mother   . Congestive Heart Failure Mother   . Lung cancer Father   . Cancer Father        Lung  . Other Brother        back problems  . Heart attack Paternal Grandmother   . Cancer Maternal Grandmother        uterine  . Colon cancer Neg Hx   . Colon polyps Neg Hx     CURRENT MEDICATIONS:  Outpatient Encounter Medications as of 02/18/2019  Medication Sig Note  . acetaminophen (TYLENOL) 500 MG tablet Take 1,000 mg by mouth every 6 (six) hours as needed (for pain.).    Marland Kitchen BREO ELLIPTA 100-25 MCG/INH AEPB Inhale 2 puffs into the lungs daily.   . cyclobenzaprine (FLEXERIL) 5 MG tablet Take 5 mg by mouth 3 (three) times daily as needed for muscle spasms.    . ferrous sulfate (FEOSOL) 325 (65 FE) MG tablet Take 325 mg by mouth daily with breakfast.  02/18/2019: Stopped for procedure   . furosemide (LASIX) 40 MG tablet Take 1 tablet (40 mg total) by mouth daily.   Marland Kitchen gabapentin (NEURONTIN) 100 MG capsule Take 100 mg by mouth 2 (two) times daily.    . hydrALAZINE (APRESOLINE) 25 MG tablet Take 0.5 tablets (12.5 mg total) by mouth every 8 (eight) hours.   . isosorbide mononitrate (IMDUR) 30 MG 24 hr tablet Take 0.5 tablets (15 mg total) by mouth daily.   . Lancets (ONETOUCH DELICA  PLUS [id number]) MISC    . levothyroxine (SYNTHROID) 100 MCG tablet Take 100 mcg by mouth daily before breakfast.    . loperamide (IMODIUM) 2 MG capsule Take 2-4 mg by mouth 4 (four) times daily as needed for diarrhea or loose stools.   . metoprolol succinate (TOPROL-XL) 50 MG 24 hr tablet TAKE 1 TABLET BY MOUTH ONCE DAILY.   . Multiple Vitamin (MULTIVITAMIN) tablet Take 1 tablet by mouth daily.   Glory Rosebush ULTRA test strip    . pantoprazole (PROTONIX) 40 MG tablet Take 40 mg by mouth 2 (two) times daily.    . polyethylene glycol (MIRALAX / GLYCOLAX) packet Take 17 g by mouth daily as needed (for constipation.).    Marland Kitchen potassium chloride (K-DUR) 10 MEQ tablet Take 1 tablet (10 mEq total) by mouth every other day. (Patient taking differently: Take 10 mEq by mouth daily. )   . pravastatin (PRAVACHOL) 10 MG tablet Take 10 mg by mouth daily.   Marland Kitchen PROAIR HFA 108 (90 Base) MCG/ACT inhaler INHALE 2 PUFFS EVERY 6 HOURS AS NEEDED FOR SHORTNESS OF BREATH AND WHEEZING. (Patient taking differently: Inhale 2 puffs into the lungs every 6 (six) hours as needed for wheezing or shortness of breath. )   . Probiotic Product (ALIGN PO) Take 1 tablet by mouth daily.    . sitaGLIPtin (JANUVIA) 50 MG tablet Take 50 mg by mouth every morning.    . temazepam (RESTORIL) 15 MG capsule Take 15 mg by mouth at bedtime as needed for sleep.    Marland Kitchen tiZANidine (ZANAFLEX) 2 MG tablet Take 2 mg by mouth every 8 (eight) hours as needed.    . traMADol (ULTRAM) 50 MG tablet TAKE 1 TABLET BY MOUTH EVERY SIX HOURS AS NEEDED FOR SEVERE PAIN   . XARELTO 15 MG TABS tablet TAKE 1 TABLET BY MOUTH ONCE A DAY. (Patient taking differently: Take 15 mg by mouth at bedtime. )  No facility-administered encounter medications on file as of 02/18/2019.     ALLERGIES:  Allergies  Allergen Reactions  . Flecainide Nausea Only and Other (See Comments)    Faint feeling  . Hydrocodone-Acetaminophen Nausea Only and Other (See Comments)    Severe headache   . Ibuprofen Other (See Comments)    Kidney dysfunction  . Oxycodone Hcl Nausea Only and Other (See Comments)    Headache  . Penicillins Nausea Only and Other (See Comments)    Severe headache Has patient had a PCN reaction causing immediate rash, facial/tongue/throat swelling, SOB or lightheadedness with hypotension: No Has patient had a PCN reaction causing severe rash involving mucus membranes or skin necrosis: No Has patient had a PCN reaction that required hospitalization: No Has patient had a PCN reaction occurring within the last 10 years: No If all of the above answers are "NO", then may proceed with Cephalosporin use.      PHYSICAL EXAM:  ECOG Performance status: 1  Vitals:   02/18/19 1541  BP: 111/64  Pulse: 70  Resp: 18  Temp: 98.6 F (37 C)  SpO2: 96%   Filed Weights   02/18/19 1541  Weight: 143 lb 3.2 oz (65 kg)    Physical Exam Vitals signs reviewed.  Constitutional:      Appearance: Normal appearance.  Cardiovascular:     Rate and Rhythm: Normal rate and regular rhythm.     Heart sounds: Normal heart sounds.  Pulmonary:     Effort: Pulmonary effort is normal.     Breath sounds: Normal breath sounds.  Abdominal:     General: There is no distension.     Palpations: Abdomen is soft. There is no mass.  Musculoskeletal:        General: No swelling.  Skin:    General: Skin is warm.  Neurological:     General: No focal deficit present.     Mental Status: She is alert and oriented to person, place, and time.  Psychiatric:        Mood and Affect: Mood normal.        Behavior: Behavior normal.      LABORATORY DATA:  I have reviewed the labs as listed.  CBC    Component Value Date/Time   WBC 6.4 01/23/2019 1407   RBC 3.12 (L) 01/23/2019 1407   RBC 3.12 (L) 01/23/2019 1407   HGB 7.9 (L) 01/23/2019 1407   HCT 28.8 (L) 01/23/2019 1407   PLT 287 01/23/2019 1407   MCV 92.3 01/23/2019 1407   MCH 25.3 (L) 01/23/2019 1407   MCHC 27.4 (L) 01/23/2019  1407   RDW 16.1 (H) 01/23/2019 1407   LYMPHSABS 0.6 (L) 01/23/2019 1407   MONOABS 0.3 01/23/2019 1407   EOSABS 0.0 01/23/2019 1407   BASOSABS 0.0 01/23/2019 1407   CMP Latest Ref Rng & Units 05/03/2018 06/30/2017 06/22/2017  Glucose 70 - 99 mg/dL 74 135(H) 176(H)  BUN 8 - 23 mg/dL 17 45(H) 37(H)  Creatinine 0.44 - 1.00 mg/dL 1.51(H) 1.83(H) 1.64(H)  Sodium 135 - 145 mmol/L 139 135 140  Potassium 3.5 - 5.1 mmol/L 4.4 3.7 4.3  Chloride 98 - 111 mmol/L 107 107 106  CO2 22 - 32 mmol/L 26 21(L) 22  Calcium 8.9 - 10.3 mg/dL 9.2 8.6(L) 9.7  Total Protein 6.5 - 8.1 g/dL - - -  Total Bilirubin 0.3 - 1.2 mg/dL - - -  Alkaline Phos 38 - 126 U/L - - -  AST 15 - 41 U/L - - -  ALT 14 - 54 U/L - - -       DIAGNOSTIC IMAGING:  I have independently reviewed the scans and discussed with the patient.     ASSESSMENT & PLAN:   Normocytic anemia 1.  Normocytic anemia: - Etiology is CKD and iron deficiency. - Colonoscopy on 11/30/2015 showing nonbleeding internal hemorrhoids, colonic diverticulosis. -EGD on 02/13/2017 shows normal esophagus, slightly abnormal stomach, normal duodenal bulb and second part of duodenum. - We discussed results of her blood work.  Ferritin was low at 21.  123456, folic acid, copper, LDH levels were normal.  SPEP was negative. - She received Feraheme on 02/03/2019 and 02/10/2019. -She is starting to feel better in terms of energy.  I have recommended checking her CBC tomorrow. -I plan to see her back in 6 weeks for follow-up.  I will plan to repeat her ferritin, iron panel and a CBC at that time.  If there is no improvement with adequate iron stores, will consider erythropoiesis stimulating agents.  2.  CHF: -She had pacemaker.  Ejection fraction is 25%. - She is on metoprolol XL 100 mg half tablet daily, hydralazine 12.5 mg 3 times daily, Imdur 30 mg half tablet daily.  She is also on Xarelto daily for A. fib.  3.  Diabetic neuropathy: -She will continue gabapentin 100 mg  3 times a day.  Total time spent is 25 minutes with more than 50% of the time spent face-to-face discussing treatment plan, counseling and coordination of care.    Orders placed this encounter:  Orders Placed This Encounter  Procedures  . CBC with Differential  . CBC with Differential  . Iron and TIBC  . Ferritin      Derek Jack, MD [place] [phone]

## 2019-02-19 ENCOUNTER — Inpatient Hospital Stay (HOSPITAL_COMMUNITY): Payer: PPO

## 2019-02-19 ENCOUNTER — Encounter (HOSPITAL_COMMUNITY): Payer: Self-pay

## 2019-02-19 ENCOUNTER — Ambulatory Visit (HOSPITAL_COMMUNITY): Payer: PPO

## 2019-02-19 ENCOUNTER — Encounter

## 2019-02-19 DIAGNOSIS — D509 Iron deficiency anemia, unspecified: Secondary | ICD-10-CM

## 2019-02-19 DIAGNOSIS — R2689 Other abnormalities of gait and mobility: Secondary | ICD-10-CM

## 2019-02-19 DIAGNOSIS — N189 Chronic kidney disease, unspecified: Secondary | ICD-10-CM | POA: Diagnosis not present

## 2019-02-19 DIAGNOSIS — M545 Low back pain, unspecified: Secondary | ICD-10-CM

## 2019-02-19 DIAGNOSIS — R293 Abnormal posture: Secondary | ICD-10-CM

## 2019-02-19 DIAGNOSIS — M6281 Muscle weakness (generalized): Secondary | ICD-10-CM

## 2019-02-19 LAB — CBC WITH DIFFERENTIAL/PLATELET
Abs Immature Granulocytes: 0.02 10*3/uL (ref 0.00–0.07)
Basophils Absolute: 0.1 10*3/uL (ref 0.0–0.1)
Basophils Relative: 1 %
Eosinophils Absolute: 0.3 10*3/uL (ref 0.0–0.5)
Eosinophils Relative: 4 %
HCT: 37.2 % (ref 36.0–46.0)
Hemoglobin: 10.6 g/dL — ABNORMAL LOW (ref 12.0–15.0)
Immature Granulocytes: 0 %
Lymphocytes Relative: 19 %
Lymphs Abs: 1.3 10*3/uL (ref 0.7–4.0)
MCH: 27.7 pg (ref 26.0–34.0)
MCHC: 28.5 g/dL — ABNORMAL LOW (ref 30.0–36.0)
MCV: 97.4 fL (ref 80.0–100.0)
Monocytes Absolute: 0.9 10*3/uL (ref 0.1–1.0)
Monocytes Relative: 13 %
Neutro Abs: 4.5 10*3/uL (ref 1.7–7.7)
Neutrophils Relative %: 63 %
Platelets: 315 10*3/uL (ref 150–400)
RBC: 3.82 MIL/uL — ABNORMAL LOW (ref 3.87–5.11)
RDW: 22.3 % — ABNORMAL HIGH (ref 11.5–15.5)
WBC: 7 10*3/uL (ref 4.0–10.5)
nRBC: 0 % (ref 0.0–0.2)

## 2019-02-19 NOTE — Patient Instructions (Signed)
Bridge    Lie back, legs bent. Inhale, pressing hips up. Keeping ribs in, lengthen lower back. Exhale, rolling down along spine from top. Repeat 10 times. Do 2 sessions per day.  http://pm.exer.us/55   Copyright  VHI. All rights reserved.   Lower Trunk Rotation Stretch    Keeping back flat and feet together, rotate knees to left side. Hold 10 seconds. Repeat 5  times per set. Do 2 sets per session.   http://orth.exer.us/123   Copyright  VHI. All rights reserved.

## 2019-02-19 NOTE — Therapy (Signed)
[place], [place], [address] Phone: [phone]   Fax:  [phone]  Physical Therapy Treatment  Patient Details  Name: [name] MRN: [id number] Date of Birth: March 07, 1956 Referring Provider (PT): Allyn Kenner, MD   Encounter Date: 02/19/2019  PT End of Session - 02/19/19 1318    Visit Number  2    Number of Visits  8    Date for PT Re-Evaluation  02/28/19    Authorization Type  [employer]    Authorization Time Period  02/13/19 to 02/28/19    Authorization - Visit Number  2    Authorization - Number of Visits  10    PT Start Time  1316    PT Stop Time  1355    PT Time Calculation (min)  39 min    Activity Tolerance  Patient tolerated treatment well    Behavior During Therapy  [facility] for tasks assessed/performed       Past Medical History:  Diagnosis Date  . Amiodarone toxicity   . Anemia   . Chronic bronchitis   . Chronic systolic CHF (congestive heart failure) ([place])    a. 04/2017 Echo: EF 25%.  . Congenital heart disease   . Diabetes mellitus without complication ([place])    Type II  . Diverticulosis   . Fall due to stumbling October 08, 2013   Due to shoes  . GERD (gastroesophageal reflux disease)   . Gout   . Hemorrhoids   . Hyperlipidemia   . Hyperparathyroidism   . Hypertension   . Hypothyroidism   . IDA (iron deficiency anemia)   . Microscopic colitis 9/11   Colonoscopy  . NICM (nonischemic cardiomyopathy) - tachycardia induced in the setting of AFib ([place])    a. 04/2017 Echo: EF 25%, basal-mid inferolateral, inf AK, triv AI, mild MR, sev dil LA, mod red RV fxn, mildly dil RA, mild to mod TR, PASP 59mmHg.  . Non-obstructive CAD (coronary artery disease)    a. 04/2017 Cath: LM 20p, LCX nl, RCA 20p/m.  Marland Kitchen Paroxysmal SVT (supraventricular tachycardia) ([place])   . Persistent atrial fibrillation 06/2013   a. 04/2017 Admit with CHF/NICM/AFib -> CHA2DS2VASc = 4-->Xarelto 15 mg daily  started; b. 06/2017 s/p BiV PPM (SJM ser # [id number]) and AVN ablation.  . Presence of permanent cardiac pacemaker 06/29/2017   Biventricular pacemaker placed  . PVD (peripheral vascular disease) ([place]) 09/2004   Right common femoral endarterectomy in April of 2007  . Renal insufficiency   . Schatzki's ring    Last EGD with esophageal dilatation 12F  9/11  . Scoliosis   . Varicose veins right leg pain and swelling    Past Surgical History:  Procedure Laterality Date  . ASD REPAIR  Age 54   [facility]  . AV NODE ABLATION N/A 06/29/2017   Procedure: AV NODE ABLATION;  Surgeon: Evans Lance, MD;  Location: [facility];  Service: Cardiovascular;  Laterality: N/A;  . BIOPSY  02/13/2017   Procedure: BIOPSY;  Surgeon: Daneil Dolin, MD;  Location: [facility];  Service: Endoscopy;;  duodenum gastric  . BIV PACEMAKER INSERTION CRT-P N/A 06/29/2017   Procedure: BIV PACEMAKER INSERTION CRT-P;  Surgeon: Evans Lance, MD;  Location: [place] [facility];  Service: Cardiovascular;  Laterality: N/A;  . CARDIOVERSION N/A 11/13/2013   Procedure: CARDIOVERSION;  Surgeon: Herminio Commons, MD;  Location: [facility] ORS;  Service: Endoscopy;  Laterality: N/A;  . COLONOSCOPY  03/04/2010   anal canal hemorrhoids otherwise normal. TI normal. Bx showed lymphocytic colitis. next TCS 02/2020  . COLONOSCOPY  April 2008   [place]hman: Pancolonic diverticulosis, external hemorrhoids  . COLONOSCOPY N/A 11/30/2015   RMR: Normal terminal ileum for 10 cm, nonbleeding grade 1 internal hemorrhoids, colonic diverticulosis. Next colonoscopy in 2027.  Marland Kitchen ELAS  06-01-11   Right saphenous ELAS   . ESOPHAGEAL DILATION N/A 05/28/2015   Procedure: ESOPHAGEAL DILATION;  Surgeon: Daneil Dolin, MD;  Location: [facility];  Service: Endoscopy;  Laterality: N/A;  . ESOPHAGOGASTRODUODENOSCOPY  03/04/2010   noncritical appearing Schatzki ring. SB bx negative  . ESOPHAGOGASTRODUODENOSCOPY  05/2008    erosive reflux esophagatitis, noncritical Schatzki ring  . ESOPHAGOGASTRODUODENOSCOPY N/A 05/28/2015   [id number] esophagus somewhat baggy, likely due to underlying esophageal motility disorder/small HH   . ESOPHAGOGASTRODUODENOSCOPY N/A 02/13/2017   Dr. Gala Romney: subtly abnormal stomach of doubtful clinical significant s/p biopsy, normal duodenum s/p biopsy, fundic gland polyp on path, normal duodenal biopsy  . [place]  2010  . Left hemithyroidectomy    . Left parathyroidectomy  2007  . Parathyroid adenoma  2007  . Right common femoral endarterectomy  2007  . RIGHT/LEFT HEART CATH AND CORONARY ANGIOGRAPHY N/A 05/15/2017   Procedure: RIGHT/LEFT HEART CATH AND CORONARY ANGIOGRAPHY;  Surgeon: Troy Sine, MD;  Location: [place] [facility];  Service: Cardiovascular;  Laterality: N/A;  . Small bowel capsule  12 2009   Mid to distal small bowel with edema, erosions, tiny ulceration felt to be NSAID related  . TONSILLECTOMY      There were no vitals filed for this visit.  Subjective Assessment - 02/19/19 1311    Subjective  Pt reports the spasms have almost resolved in lower back, reoprts mainly stiffness neck.  No reports    Patient Stated Goals  move around and do what I can normally do without any pain    Currently in Pain?  No/denies         [facility] PT Assessment - 02/19/19 0001      Assessment   Medical Diagnosis  Back pain    Referring Provider (PT)  Allyn Kenner, MD    Onset Date/Surgical Date  --   approximatelt 2-3 weeks ago   Next MD Visit  no f/u scheduled    Prior Therapy  None      Standardized Balance Assessment   Standardized Balance Assessment  Dynamic Gait Index      Dynamic Gait Index   Level Surface  Mild Impairment    Change in Gait Speed  Moderate Impairment    Gait with Horizontal Head Turns  Moderate Impairment    Gait with Vertical Head Turns  Moderate Impairment    Gait and Pivot Turn  Mild Impairment    Step Over Obstacle  Mild Impairment     Step Around Obstacles  Mild Impairment    Steps  Moderate Impairment    Total Score  12                   [facility] Adult PT Treatment/Exercise - 02/19/19 0001      Exercises   Exercises  Lumbar      Lumbar Exercises: Stretches   Active Hamstring Stretch  2 reps;30 seconds    Active Hamstring Stretch Limitations  supine wiht hands behind knee    Lower Trunk Rotation  5 reps;10 seconds      Lumbar Exercises:  Seated   Other Seated Lumbar Exercises  3D thoracic excursion wiht UE movement 5x each      Lumbar Exercises: Supine   Bridge  10 reps;3 seconds    Bridge Limitations  2 sets      Lumbar Exercises: Sidelying   Clam  10 reps;3 seconds    Clam Limitations  cueing for form and UE A to reduce posterior lean             PT Education - 02/19/19 1321    Education Details  Reviewed goals, established HEP and educated importance of compliance for maximal benefits.    Person(s) Educated  Patient    Methods  Explanation    Comprehension  Verbalized understanding       PT Short Term Goals - 02/13/19 1121      PT SHORT TERM GOAL #1   Title  Pt will be independent with HEP, perform consistently with correct form and update PRN.    Time  2    Period  Weeks    Status  New    Target Date  02/27/19        PT Long Term Goals - 02/13/19 1121      PT LONG TERM GOAL #1   Title  Pt will perform SLS and tandem stance for 15 sec or > bilaterally to reduce risk for falls.    Time  4    Period  Weeks    Status  New    Target Date  03/13/19      PT LONG TERM GOAL #2   Title  Pt will perform 5x STS in 11.4 seec or < to demonstrate reduced pain with functional activities and return to PLOF.    Time  4    Period  Weeks    Status  New      PT LONG TERM GOAL #3   Title  Pt will complete 3MWT with gait speed of 0.21m/s or > to demo improved gait mechanics, functional strength, and reduce risk for falls.    Time  4    Period  Weeks    Status  New      PT LONG TERM  GOAL #4   Title  Pt will improve overall BLE strength by 1 MMT grade to improve gait mechanics and ability to perform household chores.    Time  4    Period  Weeks    Status  New      PT LONG TERM GOAL #5   Title  Pt will self report 0/10 pain at worst with funcitonal activities around the house to improve QoL and return to PLOF.    Time  4    Period  Weeks    Status  New            Plan - 02/19/19 1407    Clinical Impression Statement  Reviewed goals, educated importance of complaince of HEP and established HEP for lumbar mobility and gluteal strengthening.  DGI complete with score of 12/18, difficulty changing speed, LOB with horizontal head turns and step to pattern with steps wiht handrail required for safety.  No reoprts of pain through session.    Personal Factors and Comorbidities  Comorbidity 3+;Time since onset of injury/illness/exacerbation    Comorbidities  see above    Examination-Activity Limitations  Reach Overhead;Sit    Examination-Participation Restrictions  Cleaning;Driving    Stability/Clinical Decision Making  Stable/Uncomplicated    Clinical Decision Making  Low  Rehab Potential  Good    PT Frequency  2x / week    PT Duration  4 weeks    PT Treatment/Interventions  ADLs/Self Care Home Management;Aquatic Therapy;Biofeedback;Cryotherapy;Moist Heat;DME Instruction;Gait training;Stair training;Functional mobility training;Therapeutic activities;Therapeutic exercise;Balance training;Neuromuscular re-education;Patient/family education;Orthotic Fit/Training;Manual techniques;Passive range of motion;Dry needling;Taping;Joint Manipulations    PT Next Visit Plan  F/U on compliance with HEP.  Next session begin 3D hip excursion, begin core strengthening, stretching throughout core/hips, BLE strengthening. Balance exercises and gait/endurance training.    PT Home Exercise Plan  02/19/19: LTR and bridge       Patient will benefit from skilled therapeutic intervention in  order to improve the following deficits and impairments:  Abnormal gait, Cardiopulmonary status limiting activity, Decreased activity tolerance, Decreased balance, Decreased endurance, Decreased range of motion, Decreased strength, Hypomobility, Increased fascial restricitons, Increased muscle spasms, Impaired perceived functional ability, Impaired flexibility, Improper body mechanics, Postural dysfunction, Pain  Visit Diagnosis: Acute bilateral low back pain without sciatica  Abnormal posture  Other abnormalities of gait and mobility  Muscle weakness (generalized)     Problem List Patient Active Problem List   Diagnosis Date Noted  . Atrial fibrillation with rapid ventricular response ([place]) 06/29/2017  . Acute bronchitis 05/24/2017  . CAD (coronary artery disease) 05/17/2017  . Elevated troponin   . Acute systolic heart failure ([place])   . Non-ST elevation (NSTEMI) myocardial infarction ([place])   . Non-ischemic cardiomyopathy ([place])   . Acute on chronic combined systolic and diastolic CHF (congestive heart failure) ([place]) 05/10/2017  . Persistent atrial fibrillation 05/09/2017  . LLQ pain 01/04/2017  . ILD (interstitial lung disease) ([place]) 11/21/2016  . Hemorrhoids 08/21/2016  . IDA (iron deficiency anemia) 01/11/2016  . Diverticulosis of colon without hemorrhage   . Abdominal pain, right upper quadrant 10/29/2015  . Rectal bleeding 10/29/2015  . Normocytic anemia 10/29/2015  . Dysphagia   . Abnormality of esophagus 04/29/2015  . Esophageal dysphagia 03/26/2015  . OSA (obstructive sleep apnea) 12/31/2013  . Atrial fibrillation with RVR ([place]) 11/09/2013  . CKD (chronic kidney disease), stage III ([place]) 11/09/2013  . Hyperkalemia 11/07/2013  . Diastolic dysfunction [id number]  . DM (diabetes mellitus) ([place]) 11/07/2013  . Varicose veins of lower extremities with ulcer ([place]) 10/17/2013  . Cold-Left Hand / Foot 10/08/2013  . Circulation problem-Left foot 10/08/2013  .  Atherosclerosis of native arteries of the extremities with ulceration(440.23) 10/08/2013  . A-fib ([place]) 08/05/2013  . Constipation 04/11/2013  . Peripheral vascular disease, unspecified ([place]) 10/27/2011  . Varicose veins of bilateral lower extremities with other complications 0000000  . COLLAGENOUS COLITIS 04/08/2010  . GERD 02/18/2010  . HYPERTHYROIDISM 02/11/2010   Ihor Austin, [place]; [place]  Aldona Lento 02/19/2019, 2:13 PM  [facility] [address] [place], [place], [address] Phone: [phone]   Fax:  [phone]  Name: [name] MRN: [id number] Date of Birth: 1955-11-22

## 2019-02-20 ENCOUNTER — Encounter (HOSPITAL_COMMUNITY)
Admission: RE | Admit: 2019-02-20 | Discharge: 2019-02-20 | Disposition: A | Discharge status: Home / Self Care | Payer: PPO | Source: Ambulatory Visit | Attending: Internal Medicine | Admitting: Internal Medicine

## 2019-02-20 ENCOUNTER — Encounter (HOSPITAL_COMMUNITY): Payer: Self-pay

## 2019-02-20 ENCOUNTER — Other Ambulatory Visit (HOSPITAL_COMMUNITY)
Admission: RE | Admit: 2019-02-20 | Discharge: 2019-02-20 | Disposition: A | Discharge status: Home / Self Care | Payer: PPO | Source: Ambulatory Visit | Attending: Internal Medicine | Admitting: Internal Medicine

## 2019-02-20 ENCOUNTER — Telehealth: Payer: Self-pay | Admitting: *Deleted

## 2019-02-20 ENCOUNTER — Telehealth (HOSPITAL_COMMUNITY): Payer: Self-pay | Admitting: Internal Medicine

## 2019-02-20 ENCOUNTER — Other Ambulatory Visit: Payer: Self-pay

## 2019-02-20 DIAGNOSIS — I5042 Chronic combined systolic (congestive) and diastolic (congestive) heart failure: Secondary | ICD-10-CM | POA: Diagnosis not present

## 2019-02-20 DIAGNOSIS — Z20828 Contact with and (suspected) exposure to other viral communicable diseases: Secondary | ICD-10-CM | POA: Insufficient documentation

## 2019-02-20 DIAGNOSIS — R0600 Dyspnea, unspecified: Secondary | ICD-10-CM | POA: Diagnosis not present

## 2019-02-20 DIAGNOSIS — D509 Iron deficiency anemia, unspecified: Secondary | ICD-10-CM | POA: Diagnosis not present

## 2019-02-20 DIAGNOSIS — Z01812 Encounter for preprocedural laboratory examination: Secondary | ICD-10-CM | POA: Diagnosis not present

## 2019-02-20 DIAGNOSIS — N183 Chronic kidney disease, stage 3 (moderate): Secondary | ICD-10-CM | POA: Diagnosis not present

## 2019-02-20 DIAGNOSIS — R0602 Shortness of breath: Secondary | ICD-10-CM | POA: Diagnosis not present

## 2019-02-20 DIAGNOSIS — R600 Localized edema: Secondary | ICD-10-CM | POA: Diagnosis not present

## 2019-02-20 LAB — SARS CORONAVIRUS 2 (TAT 6-24 HRS): SARS Coronavirus 2: NEGATIVE

## 2019-02-20 MED ORDER — PEG 3350-KCL-NA BICARB-NACL 420 G PO SOLR: 4000.0000 mL | Freq: Once | ORAL | 0 refills | Status: AC

## 2019-02-20 NOTE — Telephone Encounter (Signed)
Patient came into the office. She needs her Rx for prep sent to [facility]. Rx sent

## 2019-02-20 NOTE — Telephone Encounter (Signed)
02/20/19  pt called to cx said that she was having a procedure Monday at [facility] and has been told to quarantine herself until Monday

## 2019-02-21 ENCOUNTER — Ambulatory Visit (HOSPITAL_COMMUNITY): Payer: PPO

## 2019-02-21 NOTE — Telephone Encounter (Signed)
Lmom for pt to call us back. 

## 2019-02-21 NOTE — Telephone Encounter (Signed)
Noted. She may recall that we have held this in the past as well for procedures (colonoscopy, EGD). History of afib. No prior history of clots. Hold 2 days prior with plans to resume after procedure barring any clinical changes.

## 2019-02-21 NOTE — Telephone Encounter (Signed)
Spoke with patient and she was made aware of AB's recommendations.

## 2019-02-24 ENCOUNTER — Encounter (HOSPITAL_COMMUNITY): Payer: Self-pay

## 2019-02-24 ENCOUNTER — Ambulatory Visit (HOSPITAL_COMMUNITY): Payer: PPO | Admitting: Anesthesiology

## 2019-02-24 ENCOUNTER — Other Ambulatory Visit: Payer: Self-pay

## 2019-02-24 ENCOUNTER — Ambulatory Visit (HOSPITAL_COMMUNITY)
Admission: RE | Admit: 2019-02-24 | Discharge: 2019-02-24 | Disposition: A | Discharge status: Home / Self Care | Payer: PPO | Attending: Internal Medicine | Admitting: Internal Medicine

## 2019-02-24 ENCOUNTER — Encounter (HOSPITAL_COMMUNITY)
Admission: RE | Disposition: A | Discharge status: Home / Self Care | Payer: Self-pay | Source: Home / Self Care | Attending: Internal Medicine

## 2019-02-24 DIAGNOSIS — R1310 Dysphagia, unspecified: Secondary | ICD-10-CM | POA: Diagnosis not present

## 2019-02-24 DIAGNOSIS — K648 Other hemorrhoids: Secondary | ICD-10-CM | POA: Insufficient documentation

## 2019-02-24 DIAGNOSIS — K5730 Diverticulosis of large intestine without perforation or abscess without bleeding: Secondary | ICD-10-CM | POA: Diagnosis not present

## 2019-02-24 DIAGNOSIS — I2510 Atherosclerotic heart disease of native coronary artery without angina pectoris: Secondary | ICD-10-CM | POA: Insufficient documentation

## 2019-02-24 DIAGNOSIS — Z7951 Long term (current) use of inhaled steroids: Secondary | ICD-10-CM | POA: Insufficient documentation

## 2019-02-24 DIAGNOSIS — Z7901 Long term (current) use of anticoagulants: Secondary | ICD-10-CM | POA: Insufficient documentation

## 2019-02-24 DIAGNOSIS — I739 Peripheral vascular disease, unspecified: Secondary | ICD-10-CM | POA: Insufficient documentation

## 2019-02-24 DIAGNOSIS — Z79899 Other long term (current) drug therapy: Secondary | ICD-10-CM | POA: Diagnosis not present

## 2019-02-24 DIAGNOSIS — K219 Gastro-esophageal reflux disease without esophagitis: Secondary | ICD-10-CM | POA: Insufficient documentation

## 2019-02-24 DIAGNOSIS — I4819 Other persistent atrial fibrillation: Secondary | ICD-10-CM | POA: Diagnosis not present

## 2019-02-24 DIAGNOSIS — K222 Esophageal obstruction: Secondary | ICD-10-CM | POA: Diagnosis not present

## 2019-02-24 DIAGNOSIS — K625 Hemorrhage of anus and rectum: Secondary | ICD-10-CM

## 2019-02-24 DIAGNOSIS — D509 Iron deficiency anemia, unspecified: Secondary | ICD-10-CM | POA: Diagnosis not present

## 2019-02-24 DIAGNOSIS — K449 Diaphragmatic hernia without obstruction or gangrene: Secondary | ICD-10-CM | POA: Insufficient documentation

## 2019-02-24 DIAGNOSIS — Z9889 Other specified postprocedural states: Secondary | ICD-10-CM | POA: Diagnosis not present

## 2019-02-24 DIAGNOSIS — I110 Hypertensive heart disease with heart failure: Secondary | ICD-10-CM | POA: Insufficient documentation

## 2019-02-24 DIAGNOSIS — I252 Old myocardial infarction: Secondary | ICD-10-CM | POA: Insufficient documentation

## 2019-02-24 DIAGNOSIS — R1319 Other dysphagia: Secondary | ICD-10-CM

## 2019-02-24 HISTORY — PX: ESOPHAGOGASTRODUODENOSCOPY (EGD) WITH PROPOFOL: SHX5813

## 2019-02-24 HISTORY — PX: COLONOSCOPY WITH PROPOFOL: SHX5780

## 2019-02-24 HISTORY — PX: MALONEY DILATION: SHX5535

## 2019-02-24 LAB — GLUCOSE, CAPILLARY
Glucose-Capillary: 78 mg/dL (ref 70–99)
Glucose-Capillary: 93 mg/dL (ref 70–99)

## 2019-02-24 SURGERY — COLONOSCOPY WITH PROPOFOL
Anesthesia: General

## 2019-02-24 MED ORDER — CHLORHEXIDINE GLUCONATE CLOTH 2 % EX PADS: 6.0000 | MEDICATED_PAD | Freq: Once | CUTANEOUS | Status: DC

## 2019-02-24 MED ORDER — LACTATED RINGERS IV SOLN
INTRAVENOUS | Status: DC
  Administered 2019-02-24: 11:00:00 via INTRAVENOUS

## 2019-02-24 MED ORDER — PROPOFOL 500 MG/50ML IV EMUL
INTRAVENOUS | Status: DC | PRN
  Administered 2019-02-24: 135 ug/kg/min via INTRAVENOUS

## 2019-02-24 MED ORDER — KETAMINE HCL 50 MG/5ML IJ SOSY
PREFILLED_SYRINGE | INTRAMUSCULAR | Status: AC
  Filled 2019-02-24: qty 5

## 2019-02-24 MED ORDER — PROMETHAZINE HCL 25 MG/ML IJ SOLN: 6.2500 mg | INTRAMUSCULAR | Status: DC | PRN

## 2019-02-24 MED ORDER — MIDAZOLAM HCL 2 MG/2ML IJ SOLN: 0.5000 mg | Freq: Once | INTRAMUSCULAR | Status: DC | PRN

## 2019-02-24 MED ORDER — KETAMINE HCL 10 MG/ML IJ SOLN
INTRAMUSCULAR | Status: DC | PRN
  Administered 2019-02-24: 10 mg via INTRAVENOUS

## 2019-02-24 MED ORDER — MIDAZOLAM HCL 5 MG/5ML IJ SOLN
INTRAMUSCULAR | Status: DC | PRN
  Administered 2019-02-24 (×2): 1 mg via INTRAVENOUS

## 2019-02-24 MED ORDER — MIDAZOLAM HCL 2 MG/2ML IJ SOLN
INTRAMUSCULAR | Status: AC
  Filled 2019-02-24: qty 2

## 2019-02-24 NOTE — Anesthesia Preprocedure Evaluation (Signed)
Anesthesia Evaluation  Patient identified by MRN, date of birth, ID band Patient awake    Reviewed: Allergy & Precautions, NPO status , Patient's Chart, lab work & pertinent test results, reviewed documented beta blocker date and time   Airway Mallampati: II  TM Distance: >3 FB Neck ROM: Full    Dental no notable dental hx.    Pulmonary sleep apnea ,  States doesn't use CPAP   Pulmonary exam normal breath sounds clear to auscultation       Cardiovascular Exercise Tolerance: Good hypertension, Pt. on medications and Pt. on home beta blockers + CAD, + Past MI, + Peripheral Vascular Disease and +CHF  Normal cardiovascular exam+ dysrhythmias Atrial Fibrillation + pacemaker I Rhythm:Regular Rate:Normal     Neuro/Psych negative neurological ROS  negative psych ROS   GI/Hepatic Neg liver ROS, GERD  Medicated and Controlled,  Endo/Other  diabetesHypothyroidism   Renal/GU Renal InsufficiencyRenal disease  negative genitourinary   Musculoskeletal negative musculoskeletal ROS (+)   Abdominal   Peds negative pediatric ROS (+)  Hematology negative hematology ROS (+)   Anesthesia Other Findings   Reproductive/Obstetrics negative OB ROS                             Anesthesia Physical Anesthesia Plan  ASA: IV  Anesthesia Plan: General   Post-op Pain Management:    Induction: Intravenous  PONV Risk Score and Plan: 3 and TIVA, Propofol infusion, Ondansetron and Treatment may vary due to age or medical condition  Airway Management Planned: Simple Face Mask and Nasal Cannula  Additional Equipment:   Intra-op Plan:   Post-operative Plan:   Informed Consent: I have reviewed the patients History and Physical, chart, labs and discussed the procedure including the risks, benefits and alternatives for the proposed anesthesia with the patient or authorized representative who has indicated his/her  understanding and acceptance.     Dental advisory given  Plan Discussed with: CRNA  Anesthesia Plan Comments: (Plan Full PPE use  Plan GA with GETA as needed d/w pt -WTP with same after Q&A)        Anesthesia Quick Evaluation

## 2019-02-24 NOTE — Discharge Instructions (Signed)
Colonoscopy Discharge Instructions  Read the instructions outlined below and refer to this sheet in the next few weeks. These discharge instructions provide you with general information on caring for yourself after you leave the hospital. Your doctor may also give you specific instructions. While your treatment has been planned according to the most current medical practices available, unavoidable complications occasionally occur. If you have any problems or questions after discharge, call Dr. Gala Romney at [phone]. ACTIVITY  You may resume your regular activity, but move at a slower pace for the next 24 hours.   Take frequent rest periods for the next 24 hours.   Walking will help get rid of the air and reduce the bloated feeling in your belly (abdomen).   No driving for 24 hours (because of the medicine (anesthesia) used during the test).    Do not sign any important legal documents or operate any machinery for 24 hours (because of the anesthesia used during the test).  NUTRITION  Drink plenty of fluids.   You may resume your normal diet as instructed by your doctor.   Begin with a light meal and progress to your normal diet. Heavy or fried foods are harder to digest and may make you feel sick to your stomach (nauseated).   Avoid alcoholic beverages for 24 hours or as instructed.  MEDICATIONS  You may resume your normal medications unless your doctor tells you otherwise.  WHAT YOU CAN EXPECT TODAY  Some feelings of bloating in the abdomen.   Passage of more gas than usual.   Spotting of blood in your stool or on the toilet paper.  IF YOU HAD POLYPS REMOVED DURING THE COLONOSCOPY:  No aspirin products for 7 days or as instructed.   No alcohol for 7 days or as instructed.   Eat a soft diet for the next 24 hours.  FINDING OUT THE RESULTS OF YOUR TEST Not all test results are available during your visit. If your test results are not back during the visit, make an appointment  with your caregiver to find out the results. Do not assume everything is normal if you have not heard from your caregiver or the medical facility. It is important for you to follow up on all of your test results.  SEEK IMMEDIATE MEDICAL ATTENTION IF:  You have more than a spotting of blood in your stool.   Your belly is swollen (abdominal distention).   You are nauseated or vomiting.   You have a temperature over 101.   You have abdominal pain or discomfort that is severe or gets worse throughout the day.   EGD Discharge instructions Please read the instructions outlined below and refer to this sheet in the next few weeks. These discharge instructions provide you with general information on caring for yourself after you leave the hospital. Your doctor may also give you specific instructions. While your treatment has been planned according to the most current medical practices available, unavoidable complications occasionally occur. If you have any problems or questions after discharge, please call your doctor. ACTIVITY  You may resume your regular activity but move at a slower pace for the next 24 hours.   Take frequent rest periods for the next 24 hours.   Walking will help expel (get rid of) the air and reduce the bloated feeling in your abdomen.   No driving for 24 hours (because of the anesthesia (medicine) used during the test).   You may shower.   Do not sign any  important legal documents or operate any machinery for 24 hours (because of the anesthesia used during the test).  NUTRITION  Drink plenty of fluids.   You may resume your normal diet.   Begin with a light meal and progress to your normal diet.   Avoid alcoholic beverages for 24 hours or as instructed by your caregiver.  MEDICATIONS  You may resume your normal medications unless your caregiver tells you otherwise.  WHAT YOU CAN EXPECT TODAY  You may experience abdominal discomfort such as a feeling of  fullness or gas pains.  FOLLOW-UP  Your doctor will discuss the results of your test with you.  SEEK IMMEDIATE MEDICAL ATTENTION IF ANY OF THE FOLLOWING OCCUR:  Excessive nausea (feeling sick to your stomach) and/or vomiting.   Severe abdominal pain and distention (swelling).   Trouble swallowing.   Temperature over 101 F (37.8 C).   Rectal bleeding or vomiting of blood.   Repeat colonoscopy in 10 years  Resume Xarelto today  Office visit with [place] in 3 months  At patient request, I called Jane at [phone].  I was unable to reach party.

## 2019-02-24 NOTE — Anesthesia Postprocedure Evaluation (Signed)
Anesthesia Post Note  Patient: [name]  Procedure(s) Performed: COLONOSCOPY WITH PROPOFOL (N/A ) ESOPHAGOGASTRODUODENOSCOPY (EGD) WITH PROPOFOL (N/A ) MALONEY DILATION (N/A )  Patient location during evaluation: PACU Anesthesia Type: General Level of consciousness: awake and alert and patient cooperative Pain management: pain level controlled Vital Signs Assessment: post-procedure vital signs reviewed and stable Respiratory status: spontaneous breathing, respiratory function stable and nonlabored ventilation Cardiovascular status: blood pressure returned to baseline Postop Assessment: no apparent nausea or vomiting Anesthetic complications: no     Last Vitals:  Vitals:   02/24/19 1345 02/24/19 1402  BP: 106/64 122/68  Pulse: 71 68  Resp: 15 16  Temp:  36.6 C  SpO2: 97% 92%    Last Pain:  Vitals:   02/24/19 1402  TempSrc: Oral  PainSc: 0-No pain                 Shella Lahman J

## 2019-02-24 NOTE — Op Note (Signed)
[facility] Patient Name: [name] Procedure Date: 02/24/2019 1:13 PM MRN: [id number] Date of Birth: 06-22-1956 Attending MD: Norvel Richards , MD CSN: [id number] Age: 63 Admit Type: Outpatient Procedure:                Colonoscopy Indications:              Iron deficiency anemia Providers:                Norvel Richards, MD, Gwenlyn Fudge RN, RN,                            Randa Spike, Technician Referring MD:              Medicines:                Propofol per Anesthesia Complications:            No immediate complications. Estimated Blood Loss:     Estimated blood loss: none. Procedure:                Pre-Anesthesia Assessment:                           - Prior to the procedure, a History and Physical                            was performed, and patient medications and                            allergies were reviewed. The patient's tolerance of                            previous anesthesia was also reviewed. The risks                            and benefits of the procedure and the sedation                            options and risks were discussed with the patient.                            All questions were answered, and informed consent                            was obtained. Prior Anticoagulants: The patient has                            taken no previous anticoagulant or antiplatelet                            agents. ASA Grade Assessment: III - A patient with                            severe systemic disease. After reviewing the risks  and benefits, the patient was deemed in                            satisfactory condition to undergo the procedure.                           After obtaining informed consent, the colonoscope                            was passed under direct vision. Throughout the                            procedure, the patient's blood pressure, pulse, and                            oxygen  saturations were monitored continuously. The                            [id number] scope was introduced through                            the anus and advanced to the the cecum, identified                            by appendiceal orifice and ileocecal valve. The                            colonoscopy was performed without difficulty. The                            patient tolerated the procedure well. The quality                            of the bowel preparation was adequate. The                            ileocecal valve, appendiceal orifice, and rectum                            were photographed. The entire colon was well                            visualized. Scope In: 1:16:36 PM Scope Out: 1:29:32 PM Scope Withdrawal Time: 0 hours 8 minutes 42 seconds  Total Procedure Duration: 0 hours 12 minutes 56 seconds  Findings:      The perianal and digital rectal examinations were normal.      Many small and large-mouthed diverticula were found in the entire colon.      Non-bleeding internal hemorrhoids were found during retroflexion. The       hemorrhoids were moderate and large. Scar from prior hemorrhoidectomy       noted distal rectum. Also appeared to be a AVM. With neo-vascular       appearing vessels just inside the anal verge. See photos. Appeared       innocent. Did  not appear to be bland. Impression:               - Diverticulosis in the entire examined colon.                           - Non-bleeding internal hemorrhoids. Rectal AVM                           - No specimens collected. Moderate Sedation:      Moderate (conscious) sedation was personally administered by an       anesthesia professional. The following parameters were monitored: oxygen       saturation, heart rate, blood pressure, respiratory rate, EKG, adequacy       of pulmonary ventilation, and response to care. Total physician       intraservice time was 15 minutes. Recommendation:           - Patient  has a contact number available for                            emergencies. The signs and symptoms of potential                            delayed complications were discussed with the                            patient. Return to normal activities tomorrow.                            Written discharge instructions were provided to the                            patient.                           - Advance diet as tolerated. Resume Xarelto today                           - Continue present medications.                           - Repeat colonoscopy in 10 years for screening                            purposes.                           - Return to GI clinic in 3 months. Procedure Code(s):        --- Professional ---                           [phone], Colonoscopy, flexible; diagnostic, including                            collection of specimen(s) by brushing or washing,  when performed (separate procedure) Diagnosis Code(s):        --- Professional ---                           K64.8, Other hemorrhoids                           D50.9, Iron deficiency anemia, unspecified                           K57.30, Diverticulosis of large intestine without                            perforation or abscess without bleeding CPT copyright 2019 American Medical Association. All rights reserved. The codes documented in this report are preliminary and upon coder review may  be revised to meet current compliance requirements. Cristopher Estimable. Lucresia Simic, MD Norvel Richards, MD 02/24/2019 1:48:10 PM This report has been signed electronically. Number of Addenda: 0

## 2019-02-24 NOTE — H&P (Signed)
@LOGO @   Primary Care Physician:  Celene Squibb, MD Primary Gastroenterologist:  Dr. Gala Romney  Pre-Procedure History & Physical: HPI:  [name] is a 63 y.o. female here for further evaluation of esophageal dysphagia and iron deficiency anemia via EGD and colonoscopy.  Xarelto held x2 days.  Past Medical History:  Diagnosis Date  . Amiodarone toxicity   . Anemia   . Chronic bronchitis   . Chronic systolic CHF (congestive heart failure) ([place])    a. 04/2017 Echo: EF 25%.  . Congenital heart disease   . Diabetes mellitus without complication ([place])    Type II  . Diverticulosis   . Fall due to stumbling October 08, 2013   Due to shoes  . GERD (gastroesophageal reflux disease)   . Gout   . Hemorrhoids   . Hyperlipidemia   . Hyperparathyroidism   . Hypertension   . Hypothyroidism   . IDA (iron deficiency anemia)   . Microscopic colitis 9/11   Colonoscopy  . NICM (nonischemic cardiomyopathy) - tachycardia induced in the setting of AFib ([place])    a. 04/2017 Echo: EF 25%, basal-mid inferolateral, inf AK, triv AI, mild MR, sev dil LA, mod red RV fxn, mildly dil RA, mild to mod TR, PASP 53mmHg.  . Non-obstructive CAD (coronary artery disease)    a. 04/2017 Cath: LM 20p, LCX nl, RCA 20p/m.  Marland Kitchen Paroxysmal SVT (supraventricular tachycardia) ([place])   . Persistent atrial fibrillation 06/2013   a. 04/2017 Admit with CHF/NICM/AFib -> CHA2DS2VASc = 4-->Xarelto 15 mg daily started; b. 06/2017 s/p BiV PPM (SJM ser # [id number]) and AVN ablation.  . Presence of permanent cardiac pacemaker 06/29/2017   Biventricular pacemaker placed  . PVD (peripheral vascular disease) ([name]) 09/2004   Right common femoral endarterectomy in April of 2007  . Renal insufficiency   . Schatzki's ring    Last EGD with esophageal dilatation 72F  9/11  . Scoliosis   . Varicose veins right leg pain and swelling    Past Surgical History:  Procedure Laterality Date  . ASD REPAIR  Age 37   [facility]  . AV NODE ABLATION N/A 06/29/2017   Procedure: AV NODE ABLATION;  Surgeon: Evans Lance, MD;  Location: [place] [facility];  Service: Cardiovascular;  Laterality: N/A;  . BIOPSY  02/13/2017   Procedure: BIOPSY;  Surgeon: Daneil Dolin, MD;  Location: [facility];  Service: Endoscopy;;  duodenum gastric  . BIV PACEMAKER INSERTION CRT-P N/A 06/29/2017   Procedure: BIV PACEMAKER INSERTION CRT-P;  Surgeon: Evans Lance, MD;  Location: [place] [facility];  Service: Cardiovascular;  Laterality: N/A;  . CARDIOVERSION N/A 11/13/2013   Procedure: CARDIOVERSION;  Surgeon: Herminio Commons, MD;  Location: [facility];  Service: Endoscopy;  Laterality: N/A;  . COLONOSCOPY  03/04/2010   anal canal hemorrhoids otherwise normal. TI normal. Bx showed lymphocytic colitis. next TCS 02/2020  . COLONOSCOPY  April 2008   Rehman: Pancolonic diverticulosis, external hemorrhoids  . COLONOSCOPY N/A 11/30/2015   RMR: Normal terminal ileum for 10 cm, nonbleeding grade 1 internal hemorrhoids, colonic diverticulosis. Next colonoscopy in 2027.  Marland Kitchen ELAS  06-01-11   Right saphenous ELAS   . ESOPHAGEAL DILATION N/A 05/28/2015   Procedure: ESOPHAGEAL DILATION;  Surgeon: Daneil Dolin, MD;  [facility];  Service: Endoscopy;  Laterality: N/A;  . ESOPHAGOGASTRODUODENOSCOPY  03/04/2010   noncritical appearing Schatzki ring. SB bx negative  . ESOPHAGOGASTRODUODENOSCOPY  05/2008  erosive reflux esophagatitis, noncritical Schatzki ring  . ESOPHAGOGASTRODUODENOSCOPY N/A 05/28/2015   [id number] esophagus somewhat baggy, likely due to underlying esophageal motility disorder/small HH   . ESOPHAGOGASTRODUODENOSCOPY N/A 02/13/2017   Dr. Gala Romney: subtly abnormal stomach of doubtful clinical significant s/p biopsy, normal duodenum s/p biopsy, fundic gland polyp on path, normal duodenal biopsy  . [place]  2010  . Left hemithyroidectomy    . Left parathyroidectomy  2007  . Parathyroid adenoma  2007  .  Right common femoral endarterectomy  2007  . RIGHT/LEFT HEART CATH AND CORONARY ANGIOGRAPHY N/A 05/15/2017   Procedure: RIGHT/LEFT HEART CATH AND CORONARY ANGIOGRAPHY;  Surgeon: Troy Sine, MD;  Location: [place] [facility];  Service: Cardiovascular;  Laterality: N/A;  . Small bowel capsule  12 2009   Mid to distal small bowel with edema, erosions, tiny ulceration felt to be NSAID related  . TONSILLECTOMY      Prior to Admission medications   Medication Sig Start Date End Date Taking? Authorizing Provider  acetaminophen (TYLENOL) 500 MG tablet Take 1,000 mg by mouth every 6 (six) hours as needed (for pain.).    Yes [provider]  BREO ELLIPTA 100-25 MCG/INH AEPB Inhale 2 puffs into the lungs daily. 07/10/18  Yes [provider]  cyclobenzaprine (FLEXERIL) 5 MG tablet Take 5 mg by mouth 3 (three) times daily as needed for muscle spasms.  11/28/18  Yes [provider]  ferrous sulfate (FEOSOL) 325 (65 FE) MG tablet Take 325 mg by mouth daily with breakfast.    Yes [provider]  furosemide (LASIX) 40 MG tablet Take 1 tablet (40 mg total) by mouth daily. 07/10/18  Yes Evans Lance, MD  gabapentin (NEURONTIN) 100 MG capsule Take 100 mg by mouth 2 (two) times daily.  04/20/17  Yes [provider]  hydrALAZINE (APRESOLINE) 25 MG tablet Take 0.5 tablets (12.5 mg total) by mouth every 8 (eight) hours. 05/17/17  Yes Theora Gianotti, NP  isosorbide mononitrate (IMDUR) 30 MG 24 hr tablet Take 0.5 tablets (15 mg total) by mouth daily. 05/18/17  Yes Theora Gianotti, NP  Lancets (ONETOUCH DELICA PLUS [id number]) [place]  01/02/19  Yes [provider]  levothyroxine (SYNTHROID) 100 MCG tablet Take 100 mcg by mouth daily before breakfast.  12/02/18  Yes [provider]  loperamide (IMODIUM) 2 MG capsule Take 2-4 mg by mouth 4 (four) times daily as needed for diarrhea or loose stools.   Yes [provider]  metoprolol  succinate (TOPROL-XL) 50 MG 24 hr tablet TAKE 1 TABLET BY MOUTH ONCE DAILY. 07/08/18  Yes Evans Lance, MD  Multiple Vitamin (MULTIVITAMIN) tablet Take 1 tablet by mouth daily.   Yes [provider]  ONETOUCH ULTRA test strip  01/02/19  Yes [provider]  pantoprazole (PROTONIX) 40 MG tablet Take 40 mg by mouth 2 (two) times daily.    Yes [provider]  polyethylene glycol (MIRALAX / GLYCOLAX) packet Take 17 g by mouth daily as needed (for constipation.).    Yes [provider]  potassium chloride (K-DUR) 10 MEQ tablet Take 1 tablet (10 mEq total) by mouth every other day. Patient taking differently: Take 10 mEq by mouth daily.  06/30/17  Yes Theora Gianotti, NP  pravastatin (PRAVACHOL) 10 MG tablet Take 10 mg by mouth daily.   Yes [provider]  PROAIR HFA 108 (90 Base) MCG/ACT inhaler INHALE 2 PUFFS EVERY 6 HOURS AS NEEDED FOR SHORTNESS OF BREATH AND  WHEEZING. Patient taking differently: Inhale 2 puffs into the lungs every 6 (six) hours as needed for wheezing or shortness of breath.  07/19/18  Yes Rigoberto Noel, MD  Probiotic Product (ALIGN PO) Take 1 tablet by mouth daily.    Yes [provider]  sitaGLIPtin (JANUVIA) 50 MG tablet Take 50 mg by mouth every morning.    Yes [provider]  temazepam (RESTORIL) 15 MG capsule Take 15 mg by mouth at bedtime as needed for sleep.  02/22/17  Yes [provider]  tiZANidine (ZANAFLEX) 2 MG tablet Take 2 mg by mouth every 8 (eight) hours as needed.  01/22/19  Yes [provider]  traMADol (ULTRAM) 50 MG tablet TAKE 1 TABLET BY MOUTH EVERY SIX HOURS AS NEEDED FOR SEVERE PAIN 02/06/19  Yes [provider]  XARELTO 15 MG TABS tablet TAKE 1 TABLET BY MOUTH ONCE A DAY. Patient taking differently: Take 15 mg by mouth at bedtime.  08/30/18  Yes Evans Lance, MD    Allergies as of 12/11/2018 - Review Complete 12/11/2018  Allergen Reaction Noted  . Flecainide  Nausea Only and Other (See Comments) 11/07/2013  . Hydrocodone-acetaminophen Nausea Only and Other (See Comments)   . Ibuprofen Other (See Comments) 09/05/2011  . Oxycodone hcl Nausea Only and Other (See Comments)   . Penicillins Nausea Only and Other (See Comments)     Family History  Problem Relation Age of Onset  . Stroke Mother   . Heart disease Mother        CHF  - Amputation-Right Leg  . Hyperlipidemia Mother   . Hypertension Mother   . Congestive Heart Failure Mother   . Lung cancer Father   . Cancer Father        Lung  . Other Brother        back problems  . Heart attack Paternal Grandmother   . Cancer Maternal Grandmother        uterine  . Colon cancer Neg Hx   . Colon polyps Neg Hx     Social History   Socioeconomic History  . Marital status: Single    Spouse name: Not on file  . Number of children: 0  . Years of education: Not on file  . Highest education level: Not on file  Occupational History  . Occupation: Sales promotion account executive  Social Needs  . Financial resource strain: Not on file  . Food insecurity    Worry: Not on file    Inability: Not on file  . Transportation needs    Medical: Not on file    Non-medical: Not on file  Tobacco Use  . Smoking status: Never Smoker  . Smokeless tobacco: Never Used  . Tobacco comment: Never smoked  Substance and Sexual Activity  . Alcohol use: No    Alcohol/week: 0.0 standard drinks  . Drug use: No  . Sexual activity: Never    Birth control/protection: Post-menopausal  Lifestyle  . Physical activity    Days per week: Not on file    Minutes per session: Not on file  . Stress: Not on file  Relationships  . Social Herbalist on phone: Not on file    Gets together: Not on file    Attends religious service: Not on file    Active member of club or organization: Not on file    Attends meetings of clubs or organizations: Not on file    Relationship status: Not on file  .  Intimate partner violence    Fear  of current or ex partner: Not on file    Emotionally abused: Not on file    Physically abused: Not on file    Forced sexual activity: Not on file  Other Topics Concern  . Not on file  Social History Narrative  . Not on file    Review of Systems: See HPI, otherwise negative ROS  Physical Exam: BP 127/76   Pulse 70   Temp 97.8 F (36.6 C) (Oral)   Resp 18   SpO2 97%  General:   Alert,  Well-developed, well-nourished, pleasant and cooperative in NAD Neck:  Supple; no masses or thyromegaly. No significant cervical adenopathy. Lungs:  Clear throughout to auscultation.   No wheezes, crackles, or rhonchi. No acute distress. Heart:  Regular rate and rhythm; no murmurs, clicks, rubs,  or gallops. Abdomen: Non-distended, normal bowel sounds.  Soft and nontender without appreciable mass or hepatosplenomegaly.  Pulses:  Normal pulses noted. Extremities:  Without clubbing or edema.  Impression/Plan: 63 year old lady with esophageal dysphagia and iron deficiency anemia.  Here for EGD with esophageal dilation and colonoscopy per plan.  The risks, benefits, limitations, imponderables and alternatives regarding both EGD and colonoscopy have been reviewed with the patient. Questions have been answered. All parties agreeable.      Notice: This dictation was prepared with Dragon dictation along with smaller phrase technology. Any transcriptional errors that result from this process are unintentional and may not be corrected upon review.

## 2019-02-24 NOTE — Op Note (Signed)
[facility] Patient Name: [name] Procedure Date: 02/24/2019 12:44 PM MRN: [id number] Date of Birth: 1956/03/16 Attending MD: Norvel Richards , MD CSN: [id number] Age: 63 Admit Type: Outpatient Procedure:                Upper GI endoscopy Indications:              Dysphagia Providers:                Norvel Richards, MD, Jeanann Lewandowsky. Sharon Seller, RN,                            Randa Spike, Technician Referring MD:              Medicines:                Propofol per Anesthesia Complications:            No immediate complications. Estimated Blood Loss:     Estimated blood loss was minimal. Procedure:                Pre-Anesthesia Assessment:                           - Prior to the procedure, a History and Physical                            was performed, and patient medications and                            allergies were reviewed. The patient's tolerance of                            previous anesthesia was also reviewed. The risks                            and benefits of the procedure and the sedation                            options and risks were discussed with the patient.                            All questions were answered, and informed consent                            was obtained. Prior Anticoagulants: The patient has                            taken no previous anticoagulant or antiplatelet                            agents. ASA Grade Assessment: III - A patient with                            severe systemic disease. After reviewing the risks  and benefits, the patient was deemed in                            satisfactory condition to undergo the procedure.                           After obtaining informed consent, the endoscope was                            passed under direct vision. Throughout the                            procedure, the patient's blood pressure, pulse, and                            oxygen  saturations were monitored continuously. The                            [id number] scope was introduced through the                            mouth, and advanced to the second part of duodenum.                            The upper GI endoscopy was accomplished without                            difficulty. The patient tolerated the procedure                            well. Scope In: 1:01:52 PM Scope Out: 1:11:09 PM Total Procedure Duration: 0 hours 9 minutes 17 seconds  Findings:      A small hiatal hernia was present.      The duodenal bulb and second portion of the duodenum were normal.      A mild Schatzki ring was found at the gastroesophageal junction. The       scope was withdrawn. Dilation was performed with a Maloney dilator with       mild resistance at 46 Fr. The scope was withdrawn. Dilation was       performed with a Maloney dilator with mild resistance at 56 Fr. The       dilation site was examined following endoscope reinsertion and showed no       change. Estimated blood loss was minimal. Ring was additionally       disrupted with four-quadrant bites with a cold biopsy forceps. Impression:               - Small hiatal hernia.                           - Normal duodenal bulb and second portion of the                            duodenum.                           -  Mild Schatzki ring. Dilated./Disrupted as scribed                            above                           - No specimens collected. Resume Xarelto today. See                            colonoscopy report. Moderate Sedation:      Moderate (conscious) sedation was personally administered by an       anesthesia professional. The following parameters were monitored: oxygen       saturation, heart rate, blood pressure, respiratory rate, EKG, adequacy       of pulmonary ventilation, and response to care. Recommendation:           - Patient has a contact number available for                             emergencies. The signs and symptoms of potential                            delayed complications were discussed with the                            patient. Return to normal activities tomorrow.                            Written discharge instructions were provided to the                            patient.                           - Advance diet as tolerated.                           - Continue present medications. Resume Xarelto                            today. Office visit with [place] in 3 months. See                            colonoscopy report. Procedure Code(s):        --- Professional ---                           [phone], Esophagogastroduodenoscopy, flexible,                            transoral; diagnostic, including collection of                            specimen(s) by brushing or washing, when performed                            (separate procedure)  43450, Dilation of esophagus, by unguided sound or                            bougie, single or multiple passes Diagnosis Code(s):        --- Professional ---                           K44.9, Diaphragmatic hernia without obstruction or                            gangrene                           K22.2, Esophageal obstruction                           R13.10, Dysphagia, unspecified CPT copyright 2019 American Medical Association. All rights reserved. The codes documented in this report are preliminary and upon coder review may  be revised to meet current compliance requirements. Cristopher Estimable. Rahmir Beever, MD Norvel Richards, MD 02/24/2019 1:41:26 PM This report has been signed electronically. Number of Addenda: 0

## 2019-02-24 NOTE — Transfer of Care (Signed)
Immediate Anesthesia Transfer of Care Note  Patient: [name]  Procedure(s) Performed: COLONOSCOPY WITH PROPOFOL (N/A ) ESOPHAGOGASTRODUODENOSCOPY (EGD) WITH PROPOFOL (N/A ) MALONEY DILATION (N/A )  Patient Location: PACU  Anesthesia Type:General  Level of Consciousness: drowsy and patient cooperative  Airway & Oxygen Therapy: Patient Spontanous Breathing and Patient connected to nasal cannula oxygen  Post-op Assessment: Report given to RN, Post -op Vital signs reviewed and stable and Patient moving all extremities  Post vital signs: Reviewed and stable  Last Vitals:  Vitals Value Taken Time  BP    Temp    Pulse    Resp 16 02/24/19 1337  SpO2    Vitals shown include unvalidated device data.  Last Pain:  Vitals:   02/24/19 1259  TempSrc:   PainSc: 0-No pain      Patients Stated Pain Goal: 7 ([id number])  Complications: No apparent anesthesia complications

## 2019-02-25 ENCOUNTER — Encounter (HOSPITAL_COMMUNITY): Payer: Self-pay

## 2019-02-25 ENCOUNTER — Ambulatory Visit (HOSPITAL_COMMUNITY): Payer: PPO | Attending: Internal Medicine

## 2019-02-25 DIAGNOSIS — M6281 Muscle weakness (generalized): Secondary | ICD-10-CM | POA: Diagnosis not present

## 2019-02-25 DIAGNOSIS — R293 Abnormal posture: Secondary | ICD-10-CM | POA: Diagnosis not present

## 2019-02-25 DIAGNOSIS — R2689 Other abnormalities of gait and mobility: Secondary | ICD-10-CM | POA: Diagnosis not present

## 2019-02-25 DIAGNOSIS — M545 Low back pain, unspecified: Secondary | ICD-10-CM

## 2019-02-25 NOTE — Therapy (Signed)
[place], [place], [address] Phone: [phone]   Fax:  [phone]  Physical Therapy Treatment  Patient Details  Name: [name] MRN: [id number] Date of Birth: 03/04/1956 Referring Provider (PT): Allyn Kenner, MD   Encounter Date: 02/25/2019  PT End of Session - 02/25/19 0950    Visit Number  3    Number of Visits  8    Date for PT Re-Evaluation  02/28/19    Authorization Type  [employer]    Authorization Time Period  02/13/19 to 02/28/19    Authorization - Visit Number  3    Authorization - Number of Visits  10    PT Start Time  0946    PT Stop Time  1027    PT Time Calculation (min)  41 min    Activity Tolerance  Patient tolerated treatment well    Behavior During Therapy  [facility] for tasks assessed/performed       Past Medical History:  Diagnosis Date  . Amiodarone toxicity   . Anemia   . Chronic bronchitis   . Chronic systolic CHF (congestive heart failure) ([place])    a. 04/2017 Echo: EF 25%.  . Congenital heart disease   . Diabetes mellitus without complication ([place])    Type II  . Diverticulosis   . Fall due to stumbling October 08, 2013   Due to shoes  . GERD (gastroesophageal reflux disease)   . Gout   . Hemorrhoids   . Hyperlipidemia   . Hyperparathyroidism   . Hypertension   . Hypothyroidism   . IDA (iron deficiency anemia)   . Microscopic colitis 9/11   Colonoscopy  . NICM (nonischemic cardiomyopathy) - tachycardia induced in the setting of AFib ([place])    a. 04/2017 Echo: EF 25%, basal-mid inferolateral, inf AK, triv AI, mild MR, sev dil LA, mod red RV fxn, mildly dil RA, mild to mod TR, PASP 20mmHg.  . Non-obstructive CAD (coronary artery disease)    a. 04/2017 Cath: LM 20p, LCX nl, RCA 20p/m.  Marland Kitchen Paroxysmal SVT (supraventricular tachycardia) ([place])   . Persistent atrial fibrillation 06/2013   a. 04/2017 Admit with CHF/NICM/AFib -> CHA2DS2VASc = 4-->Xarelto 15 mg daily  started; b. 06/2017 s/p BiV PPM (SJM ser # [id number]) and AVN ablation.  . Presence of permanent cardiac pacemaker 06/29/2017   Biventricular pacemaker placed  . PVD (peripheral vascular disease) ([place]) 09/2004   Right common femoral endarterectomy in April of 2007  . Renal insufficiency   . Schatzki's ring    Last EGD with esophageal dilatation 15F  9/11  . Scoliosis   . Varicose veins right leg pain and swelling    Past Surgical History:  Procedure Laterality Date  . ASD REPAIR  Age 36   [facility]  . AV NODE ABLATION N/A 06/29/2017   Procedure: AV NODE ABLATION;  Surgeon: Evans Lance, MD;  Location: [facility];  Service: Cardiovascular;  Laterality: N/A;  . BIOPSY  02/13/2017   Procedure: BIOPSY;  Surgeon: Daneil Dolin, MD;  Location: [facility];  Service: Endoscopy;;  duodenum gastric  . BIV PACEMAKER INSERTION CRT-P N/A 06/29/2017   Procedure: BIV PACEMAKER INSERTION CRT-P;  Surgeon: Evans Lance, MD;  Location: [facility];  Service: Cardiovascular;  Laterality: N/A;  . CARDIOVERSION N/A 11/13/2013   Procedure: CARDIOVERSION;  Surgeon: Herminio Commons, MD;  Location: [facility];  Service: Endoscopy;  Laterality: N/A;  . COLONOSCOPY  03/04/2010   anal canal hemorrhoids otherwise normal. TI normal. Bx showed lymphocytic colitis. next TCS 02/2020  . COLONOSCOPY  April 2008   [place]: Pancolonic diverticulosis, external hemorrhoids  . COLONOSCOPY N/A 11/30/2015   RMR: Normal terminal ileum for 10 cm, nonbleeding grade 1 internal hemorrhoids, colonic diverticulosis. Next colonoscopy in 2027.  Marland Kitchen ELAS  06-01-11   Right saphenous ELAS   . ESOPHAGEAL DILATION N/A 05/28/2015   Procedure: ESOPHAGEAL DILATION;  Surgeon: Daneil Dolin, MD;  [facility];  Service: Endoscopy;  Laterality: N/A;  . ESOPHAGOGASTRODUODENOSCOPY  03/04/2010   noncritical appearing Schatzki ring. SB bx negative  . ESOPHAGOGASTRODUODENOSCOPY  05/2008    erosive reflux esophagatitis, noncritical Schatzki ring  . ESOPHAGOGASTRODUODENOSCOPY N/A 05/28/2015   [id number] esophagus somewhat baggy, likely due to underlying esophageal motility disorder/small HH   . ESOPHAGOGASTRODUODENOSCOPY N/A 02/13/2017   Dr. Gala Romney: subtly abnormal stomach of doubtful clinical significant s/p biopsy, normal duodenum s/p biopsy, fundic gland polyp on path, normal duodenal biopsy  . [place]  2010  . Left hemithyroidectomy    . Left parathyroidectomy  2007  . Parathyroid adenoma  2007  . Right common femoral endarterectomy  2007  . RIGHT/LEFT HEART CATH AND CORONARY ANGIOGRAPHY N/A 05/15/2017   Procedure: RIGHT/LEFT HEART CATH AND CORONARY ANGIOGRAPHY;  Surgeon: Troy Sine, MD;  Location: [facility];  Service: Cardiovascular;  Laterality: N/A;  . Small bowel capsule  12 2009   Mid to distal small bowel with edema, erosions, tiny ulceration felt to be NSAID related  . TONSILLECTOMY      There were no vitals filed for this visit.  Subjective Assessment - 02/25/19 0949    Subjective  Pt reports she had colonoscopy yesterday and it took all day. Pt reports she is doing good, kind of sore from from procedure yesterday.    Pertinent History  scoliosis, pacemaker    Limitations  Sitting;House hold activities    How long can you sit comfortably?  30 minutes    How long can you stand comfortably?  no issues    How long can you walk comfortably?  no issues    Diagnostic tests  none    Patient Stated Goals  move around and do what I can normally do without any pain    Currently in Pain?  No/denies              [facility] Adult PT Treatment/Exercise - 02/25/19 0001      Lumbar Exercises: Stretches   Active Hamstring Stretch  2 reps;30 seconds    Active Hamstring Stretch Limitations  supine, hands behind knee    Lower Trunk Rotation  10 seconds    Lower Trunk Rotation Limitations  10 reps    Other Lumbar Stretch Exercise  thoracic rotation,  bolster between knees, dowel at chest, 10x5 sec holds      Lumbar Exercises: Standing   Row  10 reps    Theraband Level (Row)  Level 2 (Red)    Shoulder Extension  10 reps    Theraband Level (Shoulder Extension)  Level 2 (Red)    Other Standing Lumbar Exercises  narrow BOS, BUE paloff press, 3# bar, x10 reps; narrow BOS, BUT flexion, 3# bar, 10 reps      Lumbar Exercises: Supine   Ab Set  20 reps    AB Set Limitations  TA with exhale cues, 3 sec hold    Pelvic Tilt  10 reps    Pelvic Tilt Limitations  hooklying and seated, heavy verbal cues, unable to master form    Bridge  10 reps;3 seconds      Lumbar Exercises: Prone   Other Prone Lumbar Exercises  prone on elbows, scapular protraction/retraction, 10 reps             PT Education - 02/25/19 0950    Education Details  Exercise technique, continue HEP    Person(s) Educated  Patient    Methods  Explanation    Comprehension  Verbalized understanding       PT Short Term Goals - 02/25/19 [id number]      PT SHORT TERM GOAL #1   Title  Pt will be independent with HEP, perform consistently with correct form and update PRN.    Time  2    Period  Weeks    Status  On-going    Target Date  02/27/19        PT Long Term Goals - 02/25/19 0952      PT LONG TERM GOAL #1   Title  Pt will perform SLS and tandem stance for 15 sec or > bilaterally to reduce risk for falls.    Time  4    Period  Weeks    Status  On-going      PT LONG TERM GOAL #2   Title  Pt will perform 5x STS in 11.4 seec or < to demonstrate reduced pain with functional activities and return to PLOF.    Time  4    Period  Weeks    Status  On-going      PT LONG TERM GOAL #3   Title  Pt will complete 3MWT with gait speed of 0.71m/s or > to demo improved gait mechanics, functional strength, and reduce risk for falls.    Time  4    Period  Weeks    Status  On-going      PT LONG TERM GOAL #4   Title  Pt will improve overall BLE strength by 1 MMT grade to improve  gait mechanics and ability to perform household chores.    Time  4    Period  Weeks    Status  On-going      PT LONG TERM GOAL #5   Title  Pt will self report 0/10 pain at worst with funcitonal activities around the house to improve QoL and return to PLOF.    Time  4    Period  Weeks    Status  On-going            Plan - 02/25/19 0951    Clinical Impression Statement  Continued with pt's established POC. Added thoracic rotation using dowel and bolster to anchor hips and encourage rotational movement from thoracic spine and not lumbar spine. Pt continues to require intermittent verbal cues for form with exercises. Added postural strengthening with resistance bands, intermittent cues and denies pain. Began core strengthening this date with heavy verbal cues, but pt with improved technique with increased reps. Pt c/o some back pain with balance exercises maintaining narrow BOS with BUE punching or raising, but able to complete with good form. Pt with fair TA activation with exhalation and heavy verbal cues for core strengthening, but able to master pelvic tilts in hooklying or seated. Pt denies pain at EOS. Continue to progress as able.    Personal Factors and Comorbidities  Comorbidity 3+;Time since onset of injury/illness/exacerbation  Comorbidities  see above    Examination-Activity Limitations  Reach Overhead;Sit    Examination-Participation Restrictions  Cleaning;Driving    Stability/Clinical Decision Making  Stable/Uncomplicated    Rehab Potential  Good    PT Frequency  2x / week    PT Duration  4 weeks    PT Treatment/Interventions  ADLs/Self Care Home Management;Aquatic Therapy;Biofeedback;Cryotherapy;Moist Heat;DME Instruction;Gait training;Stair training;Functional mobility training;Therapeutic activities;Therapeutic exercise;Balance training;Neuromuscular re-education;Patient/family education;Orthotic Fit/Training;Manual techniques;Passive range of motion;Dry  needling;Taping;Joint Manipulations    PT Next Visit Plan  TA and pelvic tilt review. Continue core strengthening, stretching throughout core/hips, BLE strengthening. Balance exercises and gait/endurance training.    PT Home Exercise Plan  02/19/19: LTR and bridge    Consulted and Agree with Plan of Care  Patient       Patient will benefit from skilled therapeutic intervention in order to improve the following deficits and impairments:  Abnormal gait, Cardiopulmonary status limiting activity, Decreased activity tolerance, Decreased balance, Decreased endurance, Decreased range of motion, Decreased strength, Hypomobility, Increased fascial restricitons, Increased muscle spasms, Impaired perceived functional ability, Impaired flexibility, Improper body mechanics, Postural dysfunction, Pain  Visit Diagnosis: Acute bilateral low back pain without sciatica  Abnormal posture  Other abnormalities of gait and mobility  Muscle weakness (generalized)     Problem List Patient Active Problem List   Diagnosis Date Noted  . Atrial fibrillation with rapid ventricular response ([place]) 06/29/2017  . Acute bronchitis 05/24/2017  . CAD (coronary artery disease) 05/17/2017  . Elevated troponin   . Acute systolic heart failure ([place])   . Non-ST elevation (NSTEMI) myocardial infarction ([place])   . Non-ischemic cardiomyopathy ([place])   . Acute on chronic combined systolic and diastolic CHF (congestive heart failure) ([place]) 05/10/2017  . Persistent atrial fibrillation 05/09/2017  . LLQ pain 01/04/2017  . ILD (interstitial lung disease) ([place]) 11/21/2016  . Hemorrhoids 08/21/2016  . IDA (iron deficiency anemia) 01/11/2016  . Diverticulosis of colon without hemorrhage   . Abdominal pain, right upper quadrant 10/29/2015  . Rectal bleeding 10/29/2015  . Normocytic anemia 10/29/2015  . Dysphagia   . Abnormality of esophagus 04/29/2015  . Esophageal dysphagia 03/26/2015  . OSA (obstructive sleep apnea)  12/31/2013  . Atrial fibrillation with RVR ([place]) 11/09/2013  . CKD (chronic kidney disease), stage III ([place]) 11/09/2013  . Hyperkalemia 11/07/2013  . Diastolic dysfunction [id number]  . DM (diabetes mellitus) ([place]) 11/07/2013  . Varicose veins of lower extremities with ulcer ([place]) 10/17/2013  . Cold-Left Hand / Foot 10/08/2013  . Circulation problem-Left foot 10/08/2013  . Atherosclerosis of native arteries of the extremities with ulceration(440.23) 10/08/2013  . A-fib ([place]) 08/05/2013  . Constipation 04/11/2013  . Peripheral vascular disease, unspecified ([place]) 10/27/2011  . Varicose veins of bilateral lower extremities with other complications 0000000  . COLLAGENOUS COLITIS 04/08/2010  . GERD 02/18/2010  . HYPERTHYROIDISM 02/11/2010      Talbot Grumbling PT, DPT 02/25/19, 10:30 AM [facility] [address] [place], [place], [address] Phone: [phone]   Fax:  [phone]  Name: [name] MRN: [id number] Date of Birth: 10/10/1955

## 2019-02-26 ENCOUNTER — Encounter (HOSPITAL_COMMUNITY): Payer: Self-pay | Admitting: Internal Medicine

## 2019-02-27 ENCOUNTER — Ambulatory Visit (HOSPITAL_COMMUNITY): Payer: PPO

## 2019-02-27 ENCOUNTER — Other Ambulatory Visit: Payer: Self-pay

## 2019-02-27 ENCOUNTER — Encounter (HOSPITAL_COMMUNITY): Payer: Self-pay

## 2019-02-27 DIAGNOSIS — M6281 Muscle weakness (generalized): Secondary | ICD-10-CM

## 2019-02-27 DIAGNOSIS — R293 Abnormal posture: Secondary | ICD-10-CM

## 2019-02-27 DIAGNOSIS — M545 Low back pain, unspecified: Secondary | ICD-10-CM

## 2019-02-27 DIAGNOSIS — R2689 Other abnormalities of gait and mobility: Secondary | ICD-10-CM

## 2019-02-27 NOTE — Therapy (Addendum)
[place], [place], [address] Phone: [phone]   Fax:  [phone]  Progress Note Reporting Period 02/13/19 to 02/27/19  See note below for Objective Data and Assessment of Progress/Goals.   I have read and reviewed the following objective measures and tests assessed by Ihor Austin, PTA. I agree that pt has made gains with skilled PT thus far, improving in strength, balance, gait, and meeting 3/6 goals. I agree the pt would continue to benefit from skilled PT interventions for 2x/week for 2 weeks to further improve strength, balance, gait, endurance, reduce pain and improve ability to perform functional activities to return to PLOF. Talbot Grumbling PT, DPT 03/04/19, 10:43 AM [phone]     Physical Therapy Treatment  Patient Details  Name: [name] MRN: [id number] Date of Birth: [birth year] Referring Provider (PT): Allyn Kenner, MD   Encounter Date: 02/27/2019  PT End of Session - 02/27/19 1042    Visit Number  4    Number of Visits  8    Date for PT Re-Evaluation  03/13/19    Authorization Type  [employer]    Authorization Time Period  02/13/19 to 02/28/19; NEW: 02/28/19 to 03/13/19    Authorization - Visit Number  4    Authorization - Number of Visits  10    PT Start Time  1004    PT Stop Time  1042    PT Time Calculation (min)  38 min    Activity Tolerance  Patient tolerated treatment well    Behavior During Therapy  [facility] for tasks assessed/performed       Past Medical History:  Diagnosis Date  . Amiodarone toxicity   . Anemia   . Chronic bronchitis   . Chronic systolic CHF (congestive heart failure) ([place])    a. 04/2017 Echo: EF 25%.  . Congenital heart disease   . Diabetes mellitus without complication ([place])    Type II  . Diverticulosis   . Fall due to stumbling October 08, 2013   Due to shoes  . GERD (gastroesophageal reflux disease)   . Gout   . Hemorrhoids   .  Hyperlipidemia   . Hyperparathyroidism   . Hypertension   . Hypothyroidism   . IDA (iron deficiency anemia)   . Microscopic colitis 9/11   Colonoscopy  . NICM (nonischemic cardiomyopathy) - tachycardia induced in the setting of AFib ([place])    a. 04/2017 Echo: EF 25%, basal-mid inferolateral, inf AK, triv AI, mild MR, sev dil LA, mod red RV fxn, mildly dil RA, mild to mod TR, PASP 78mmHg.  . Non-obstructive CAD (coronary artery disease)    a. 04/2017 Cath: LM 20p, LCX nl, RCA 20p/m.  Marland Kitchen Paroxysmal SVT (supraventricular tachycardia) ([place])   . Persistent atrial fibrillation 06/2013   a. 04/2017 Admit with CHF/NICM/AFib -> CHA2DS2VASc = 4-->Xarelto 15 mg daily started; b. 06/2017 s/p BiV PPM (SJM ser # [id number]) and AVN ablation.  . Presence of permanent cardiac pacemaker 06/29/2017   Biventricular pacemaker placed  . PVD (peripheral vascular disease) ([place]) 09/2004   Right common femoral endarterectomy in April of 2007  . Renal insufficiency   . Schatzki's ring    Last EGD with esophageal dilatation 82F  9/11  . Scoliosis   . Varicose veins right leg pain and swelling    Past Surgical History:  Procedure Laterality Date  . ASD REPAIR  Age 40   [facility]  .  AV NODE ABLATION N/A 06/29/2017   Procedure: AV NODE ABLATION;  Surgeon: Evans Lance, MD;  Location: [place] [facility];  Service: Cardiovascular;  Laterality: N/A;  . BIOPSY  02/13/2017   Procedure: BIOPSY;  Surgeon: Daneil Dolin, MD;  [facility];  Service: Endoscopy;;  duodenum gastric  . BIV PACEMAKER INSERTION CRT-P N/A 06/29/2017   Procedure: BIV PACEMAKER INSERTION CRT-P;  Surgeon: Evans Lance, MD;  Location: [place] [facility];  Service: Cardiovascular;  Laterality: N/A;  . CARDIOVERSION N/A 11/13/2013   Procedure: CARDIOVERSION;  Surgeon: Herminio Commons, MD;  Location: [facility];  Service: Endoscopy;  Laterality: N/A;  . COLONOSCOPY  03/04/2010   anal canal hemorrhoids  otherwise normal. TI normal. Bx showed lymphocytic colitis. next TCS 02/2020  . COLONOSCOPY  April 2008   [place]hman: Pancolonic diverticulosis, external hemorrhoids  . COLONOSCOPY N/A 11/30/2015   RMR: Normal terminal ileum for 10 cm, nonbleeding grade 1 internal hemorrhoids, colonic diverticulosis. Next colonoscopy in 2027.  Marland Kitchen COLONOSCOPY WITH PROPOFOL N/A 02/24/2019   Procedure: COLONOSCOPY WITH PROPOFOL;  Surgeon: Daneil Dolin, MD;  [facility];  Service: Endoscopy;  Laterality: N/A;  11:15am  . ELAS  06-01-11   Right saphenous ELAS   . ESOPHAGEAL DILATION N/A 05/28/2015   Procedure: ESOPHAGEAL DILATION;  Surgeon: Daneil Dolin, MD;  [facility];  Service: Endoscopy;  Laterality: N/A;  . ESOPHAGOGASTRODUODENOSCOPY  03/04/2010   noncritical appearing Schatzki ring. SB bx negative  . ESOPHAGOGASTRODUODENOSCOPY  05/2008   erosive reflux esophagatitis, noncritical Schatzki ring  . ESOPHAGOGASTRODUODENOSCOPY N/A 05/28/2015   [id number] esophagus somewhat baggy, likely due to underlying esophageal motility disorder/small HH   . ESOPHAGOGASTRODUODENOSCOPY N/A 02/13/2017   Dr. Gala Romney: subtly abnormal stomach of doubtful clinical significant s/p biopsy, normal duodenum s/p biopsy, fundic gland polyp on path, normal duodenal biopsy  . ESOPHAGOGASTRODUODENOSCOPY (EGD) WITH PROPOFOL N/A 02/24/2019   Procedure: ESOPHAGOGASTRODUODENOSCOPY (EGD) WITH PROPOFOL;  Surgeon: Daneil Dolin, MD;  [facility];  Service: Endoscopy;  Laterality: N/A;  . [place] SURGERY  2010  . Left hemithyroidectomy    . Left parathyroidectomy  2007  . MALONEY DILATION N/A 02/24/2019   Procedure: Venia Minks DILATION;  Surgeon: Daneil Dolin, MD;  [facility];  Service: Endoscopy;  Laterality: N/A;  . Parathyroid adenoma  2007  . Right common femoral endarterectomy  2007  . RIGHT/LEFT HEART CATH AND CORONARY ANGIOGRAPHY N/A 05/15/2017   Procedure: RIGHT/LEFT HEART CATH AND CORONARY  ANGIOGRAPHY;  Surgeon: Troy Sine, MD;  Location: [place] [facility];  Service: Cardiovascular;  Laterality: N/A;  . Small bowel capsule  12 2009   Mid to distal small bowel with edema, erosions, tiny ulceration felt to be NSAID related  . TONSILLECTOMY      There were no vitals filed for this visit.  Subjective Assessment - 02/27/19 1008    Subjective  Pt stated she is sore in stomach and jaw following colonoscopy on Monday.  Reports a near miss today, stated she was rushing to standup and had some dizziness.    Patient Stated Goals  move around and do what I can normally do without any pain    Currently in Pain?  No/denies         [facility] PT Assessment - 02/27/19 0001      Assessment   Medical Diagnosis  Back pain    Referring Provider (PT)  Allyn Kenner, MD    Onset  Date/Surgical Date  --   approximately 2-3 weeks   Next MD Visit  no f/u scheduled    Prior Therapy  None      Sit to Stand   Comments  5STS 9.84"   was 5x STS: 20 seconds, from chair, no UE assist     AROM   AROM Assessment Site  Lumbar    Lumbar Flexion  WFL    Lumbar Extension  15% limited, no pain   was 25% limited   Lumbar - Right Side Bend  25% limited, no pain   was 50% limited, was painful   Lumbar - Left Side Bend  25% limited, no pain does include rotation wiht movements   was 50% limited, was painful   Lumbar - Right Rotation  25% limited, no pain   was 75% limited, was painful   Lumbar - Left Rotation  25% limited, no pain   was 75%     Strength   Right Hip Flexion  3+/5   was 3+   Right Hip Extension  3/5   was 3-   Right Hip ABduction  4-/5   was 3+   Left Hip Flexion  3+/5   was 3+   Left Hip Extension  3/5   was 3-   Left Hip ABduction  3+/5   was 3+   Right Knee Flexion  4/5   was 4/5   Right Knee Extension  4+/5   was 4+   Left Knee Flexion  4/5   was 4/5   Left Knee Extension  4+/5   was 4+   Right Ankle Dorsiflexion  4/5   was 4/5   Left Ankle Dorsiflexion  4/5    was 4/5     Ambulation/Gait   Ambulation Distance (Feet)  512 Feet   was 452 ft   Gait Comments  3MWT      Standardized Balance Assessment   Standardized Balance Assessment  Dynamic Gait Index      Dynamic Gait Index   Level Surface  Mild Impairment    Change in Gait Speed  Mild Impairment    Gait with Horizontal Head Turns  Moderate Impairment    Gait with Vertical Head Turns  Mild Impairment    Gait and Pivot Turn  Mild Impairment    Step Over Obstacle  Mild Impairment    Step Around Obstacles  Mild Impairment    Steps  Moderate Impairment    Total Score  14           [facility] Adult PT Treatment/Exercise - 02/27/19 0001      Ambulation/Gait   Ambulation/Gait  Yes    Ambulation/Gait Assistance  6: Modified independent (Device/Increase time)    Assistive device  None    Gait Pattern  Step-through pattern;Decreased arm swing - right;Decreased weight shift to left;Trunk rotated posteriorly on right    Gait velocity  .86 m/s      Exercises   Exercises  Lumbar      Lumbar Exercises: Standing   Heel Raises  10 reps    Other Standing Lumbar Exercises  tandemstance 15" with HHA    Other Standing Lumbar Exercises  SLS 5x each; hip abduction 10x each      Lumbar Exercises: Seated   Sit to Stand  5 reps    Sit to Stand Limitations  2 sets, eccentric control               PT Short  Term Goals - 02/27/19 1012      PT SHORT TERM GOAL #1   Title  Pt will be independent with HEP, perform consistently with correct form and update PRN.    Baseline  02/27/19:  reports compliance 3-4 days a week    Status  Achieved        PT Long Term Goals - 02/27/19 1012      PT LONG TERM GOAL #1   Title  Pt will perform SLS and tandem stance for 15 sec or > bilaterally to reduce risk for falls.    Baseline  02/27/19:  Only able to hold  SLS 1" max Bil.  Requires HHA wiht tandem stance 8" top with Lt LE behind, 4" with Rt behind    Status  On-going      PT LONG TERM GOAL #2   Title   Pt will perform 5x STS in 11.4 seec or < to demonstrate reduced pain with functional activities and return to PLOF.    Baseline  02/27/19: 5STS 9.84" no HHA    Status  Achieved      PT LONG TERM GOAL #3   Title  Pt will complete 3MWT with gait speed of 0.5m/s or > to demo improved gait mechanics, functional strength, and reduce risk for falls.    Baseline  02/27/19: .68m/s    Status  Achieved      PT LONG TERM GOAL #4   Title  Pt will improve overall BLE strength by 1 MMT grade to improve gait mechanics and ability to perform household chores.    Baseline  02/27/19: see MMT    Status  On-going      PT LONG TERM GOAL #5   Title  Pt will self report 0/10 pain at worst with funcitonal activities around the house to improve QoL and return to PLOF.            Plan - 02/27/19 1252    Clinical Impression Statement  Reviewed goals and pt is making good progress.  Reports compliance with HEP, able to verbalize activities completed at home.  Objective testing complete including DGI, MMT, 3MWT and 5STS.  Pt has increased cadence wiht gait, no LOB but is unsteady with task.  Pt has acheived 1/1 STG and 2/5 LTG.  Continues to have deficits with MMT and balance.  Reviewed HEP and additional exercises given for balance and strengthening.  No reports of pain, was limited by fatigue.    Personal Factors and Comorbidities  Comorbidity 3+;Time since onset of injury/illness/exacerbation    Comorbidities  see above    Examination-Activity Limitations  Reach Overhead;Sit    Examination-Participation Restrictions  Cleaning;Driving    Stability/Clinical Decision Making  Stable/Uncomplicated    Clinical Decision Making  Low    Rehab Potential  Good    PT Frequency  2x / week    PT Duration  2 weeks    PT Treatment/Interventions  ADLs/Self Care Home Management;Aquatic Therapy;Biofeedback;Cryotherapy;Moist Heat;DME Instruction;Gait training;Stair training;Functional mobility training;Therapeutic  activities;Therapeutic exercise;Balance training;Neuromuscular re-education;Patient/family education;Orthotic Fit/Training;Manual techniques;Passive range of motion;Dry needling;Taping;Joint Manipulations    PT Next Visit Plan  Continue BLE strengthening, balance exercises and gait/endurance training.    PT Home Exercise Plan  02/19/19: LTR and bridge; 02/27/19: bridge, STS, SLS and standing abduciton insturcted to complete infront of sink/counter       Patient will benefit from skilled therapeutic intervention in order to improve the following deficits and impairments:  Abnormal gait, Cardiopulmonary status limiting  activity, Decreased activity tolerance, Decreased balance, Decreased endurance, Decreased range of motion, Decreased strength, Hypomobility, Increased fascial restricitons, Increased muscle spasms, Impaired perceived functional ability, Impaired flexibility, Improper body mechanics, Postural dysfunction, Pain  Visit Diagnosis: Acute bilateral low back pain without sciatica  Abnormal posture  Other abnormalities of gait and mobility  Muscle weakness (generalized)     Problem List Patient Active Problem List   Diagnosis Date Noted  . Atrial fibrillation with rapid ventricular response ([place]) 06/29/2017  . Acute bronchitis 05/24/2017  . CAD (coronary artery disease) 05/17/2017  . Elevated troponin   . Acute systolic heart failure ([place])   . Non-ST elevation (NSTEMI) myocardial infarction ([place])   . Non-ischemic cardiomyopathy ([place])   . Acute on chronic combined systolic and diastolic CHF (congestive heart failure) ([place]) 05/10/2017  . Persistent atrial fibrillation 05/09/2017  . LLQ pain 01/04/2017  . ILD (interstitial lung disease) ([place]) 11/21/2016  . Hemorrhoids 08/21/2016  . IDA (iron deficiency anemia) 01/11/2016  . Diverticulosis of colon without hemorrhage   . Abdominal pain, right upper quadrant 10/29/2015  . Rectal bleeding 10/29/2015  . Normocytic anemia 10/29/2015   . Dysphagia   . Abnormality of esophagus 04/29/2015  . Esophageal dysphagia 03/26/2015  . OSA (obstructive sleep apnea) 12/31/2013  . Atrial fibrillation with RVR ([place]) 11/09/2013  . CKD (chronic kidney disease), stage III ([place]) 11/09/2013  . Hyperkalemia 11/07/2013  . Diastolic dysfunction [id number]  . DM (diabetes mellitus) ([place]) 11/07/2013  . Varicose veins of lower extremities with ulcer ([place]) 10/17/2013  . Cold-Left Hand / Foot 10/08/2013  . Circulation problem-Left foot 10/08/2013  . Atherosclerosis of native arteries of the extremities with ulceration(440.23) 10/08/2013  . A-fib ([place]) 08/05/2013  . Constipation 04/11/2013  . Peripheral vascular disease, unspecified ([place]) 10/27/2011  . Varicose veins of bilateral lower extremities with other complications 0000000  . COLLAGENOUS COLITIS 04/08/2010  . GERD 02/18/2010  . HYPERTHYROIDISM 02/11/2010   Ihor Austin, [place]; [place]  Aldona Lento 02/27/2019, 1:01 PM  [place] [address] [place], [place], [address] Phone: [phone]   Fax:  [phone]  Name: CIANNI POPKO MRN: [id number] Date of Birth: 27-Jul-1955

## 2019-02-27 NOTE — Patient Instructions (Signed)
ABDUCTION: Standing (Active)    Stand, feet flat. Lift right leg out to side.  Complete 2 sets of 10 repetitions. Perform 2 sessions per day.  http://gtsc.exer.us/111   Copyright  VHI. All rights reserved.   Functional Quadriceps: Sit to Stand    Sit on edge of chair, feet flat on floor. Stand upright, extending knees fully. Repeat 10 times per set. Do 1 sets per session. Do 2 sessions per day.  http://orth.exer.us/735   Copyright  VHI. All rights reserved.   Single Leg Balance: Eyes Open    Stand on right leg with eyes open. Hold 30 seconds. 5 reps 2  times per day.  http://ggbe.exer.us/5   Copyright  VHI. All rights reserved.   Bridge    Lie back, legs bent. Inhale, pressing hips up. Keeping ribs in, lengthen lower back. Exhale, rolling down along spine from top. Repeat 10 times. Do 2 sessions per day.  http://pm.exer.us/55   Copyright  VHI. All rights reserved.

## 2019-03-05 ENCOUNTER — Encounter (HOSPITAL_COMMUNITY): Payer: Self-pay

## 2019-03-05 ENCOUNTER — Other Ambulatory Visit: Payer: Self-pay

## 2019-03-05 ENCOUNTER — Ambulatory Visit (HOSPITAL_COMMUNITY): Payer: PPO

## 2019-03-05 DIAGNOSIS — M545 Low back pain, unspecified: Secondary | ICD-10-CM

## 2019-03-05 DIAGNOSIS — M6281 Muscle weakness (generalized): Secondary | ICD-10-CM

## 2019-03-05 DIAGNOSIS — R293 Abnormal posture: Secondary | ICD-10-CM

## 2019-03-05 DIAGNOSIS — R2689 Other abnormalities of gait and mobility: Secondary | ICD-10-CM

## 2019-03-05 NOTE — Therapy (Signed)
[place], [place], [address] Phone: [phone]   Fax:  [phone]  Physical Therapy Treatment  Patient Details  Name: [name] MRN: [id number] Date of Birth: 1956/04/03 Referring Provider (PT): Allyn Kenner, MD   Encounter Date: 03/05/2019  PT End of Session - 03/05/19 1029    Visit Number  5    Number of Visits  8    Date for PT Re-Evaluation  03/13/19   reassessment completed 9/3, visit #4   Authorization Type  [employer]    Authorization Time Period  02/13/19 to 02/28/19; NEW: 02/28/19 to 03/13/19    Authorization - Visit Number  2    Authorization - Number of Visits  10    PT Start Time  1030    PT Stop Time  1110    PT Time Calculation (min)  40 min    Activity Tolerance  Patient tolerated treatment well    Behavior During Therapy  [facility] for tasks assessed/performed       Past Medical History:  Diagnosis Date  . Amiodarone toxicity   . Anemia   . Chronic bronchitis   . Chronic systolic CHF (congestive heart failure) ([place])    a. 04/2017 Echo: EF 25%.  . Congenital heart disease   . Diabetes mellitus without complication ([place])    Type II  . Diverticulosis   . Fall due to stumbling October 08, 2013   Due to shoes  . GERD (gastroesophageal reflux disease)   . Gout   . Hemorrhoids   . Hyperlipidemia   . Hyperparathyroidism   . Hypertension   . Hypothyroidism   . IDA (iron deficiency anemia)   . Microscopic colitis 9/11   Colonoscopy  . NICM (nonischemic cardiomyopathy) - tachycardia induced in the setting of AFib ([place])    a. 04/2017 Echo: EF 25%, basal-mid inferolateral, inf AK, triv AI, mild MR, sev dil LA, mod red RV fxn, mildly dil RA, mild to mod TR, PASP 29mmHg.  . Non-obstructive CAD (coronary artery disease)    a. 04/2017 Cath: LM 20p, LCX nl, RCA 20p/m.  Marland Kitchen Paroxysmal SVT (supraventricular tachycardia) ([place])   . Persistent atrial fibrillation 06/2013   a. 04/2017 Admit  with CHF/NICM/AFib -> CHA2DS2VASc = 4-->Xarelto 15 mg daily started; b. 06/2017 s/p BiV PPM (SJM ser # [id number]) and AVN ablation.  . Presence of permanent cardiac pacemaker 06/29/2017   Biventricular pacemaker placed  . PVD (peripheral vascular disease) ([place]) 09/2004   Right common femoral endarterectomy in April of 2007  . Renal insufficiency   . Schatzki's ring    Last EGD with esophageal dilatation 87F  9/11  . Scoliosis   . Varicose veins right leg pain and swelling    Past Surgical History:  Procedure Laterality Date  . ASD REPAIR  Age 18   [facility]  . AV NODE ABLATION N/A 06/29/2017   Procedure: AV NODE ABLATION;  Surgeon: Evans Lance, MD;  Location: [facility];  Service: Cardiovascular;  Laterality: N/A;  . BIOPSY  02/13/2017   Procedure: BIOPSY;  Surgeon: Daneil Dolin, MD;  [facility];  Service: Endoscopy;;  duodenum gastric  . BIV PACEMAKER INSERTION CRT-P N/A 06/29/2017   Procedure: BIV PACEMAKER INSERTION CRT-P;  Surgeon: Evans Lance, MD;  Location: [facility];  Service: Cardiovascular;  Laterality: N/A;  . CARDIOVERSION N/A 11/13/2013   Procedure: CARDIOVERSION;  Surgeon: Jamesetta So  Blanchard Mane, MD;  Location: [facility];  Service: Endoscopy;  Laterality: N/A;  . COLONOSCOPY  03/04/2010   anal canal hemorrhoids otherwise normal. TI normal. Bx showed lymphocytic colitis. next TCS 02/2020  . COLONOSCOPY  April 2008   Rehman: Pancolonic diverticulosis, external hemorrhoids  . COLONOSCOPY N/A 11/30/2015   RMR: Normal terminal ileum for 10 cm, nonbleeding grade 1 internal hemorrhoids, colonic diverticulosis. Next colonoscopy in 2027.  Marland Kitchen COLONOSCOPY WITH PROPOFOL N/A 02/24/2019   Procedure: COLONOSCOPY WITH PROPOFOL;  Surgeon: Daneil Dolin, MD;  [facility];  Service: Endoscopy;  Laterality: N/A;  11:15am  . ELAS  06-01-11   Right saphenous ELAS   . ESOPHAGEAL DILATION N/A 05/28/2015   Procedure: ESOPHAGEAL  DILATION;  Surgeon: Daneil Dolin, MD;  [facility];  Service: Endoscopy;  Laterality: N/A;  . ESOPHAGOGASTRODUODENOSCOPY  03/04/2010   noncritical appearing Schatzki ring. SB bx negative  . ESOPHAGOGASTRODUODENOSCOPY  05/2008   erosive reflux esophagatitis, noncritical Schatzki ring  . ESOPHAGOGASTRODUODENOSCOPY N/A 05/28/2015   [id number] esophagus somewhat baggy, likely due to underlying esophageal motility disorder/small HH   . ESOPHAGOGASTRODUODENOSCOPY N/A 02/13/2017   Dr. Gala Romney: subtly abnormal stomach of doubtful clinical significant s/p biopsy, normal duodenum s/p biopsy, fundic gland polyp on path, normal duodenal biopsy  . ESOPHAGOGASTRODUODENOSCOPY (EGD) WITH PROPOFOL N/A 02/24/2019   Procedure: ESOPHAGOGASTRODUODENOSCOPY (EGD) WITH PROPOFOL;  Surgeon: Daneil Dolin, MD;  [facility];  Service: Endoscopy;  Laterality: N/A;  . [place] SURGERY  2010  . Left hemithyroidectomy    . Left parathyroidectomy  2007  . MALONEY DILATION N/A 02/24/2019   Procedure: Venia Minks DILATION;  Surgeon: Daneil Dolin, MD;  [facility];  Service: Endoscopy;  Laterality: N/A;  . Parathyroid adenoma  2007  . Right common femoral endarterectomy  2007  . RIGHT/LEFT HEART CATH AND CORONARY ANGIOGRAPHY N/A 05/15/2017   Procedure: RIGHT/LEFT HEART CATH AND CORONARY ANGIOGRAPHY;  Surgeon: Troy Sine, MD;  Location: [facility];  Service: Cardiovascular;  Laterality: N/A;  . Small bowel capsule  12 2009   Mid to distal small bowel with edema, erosions, tiny ulceration felt to be NSAID related  . TONSILLECTOMY      There were no vitals filed for this visit.  Subjective Assessment - 03/05/19 1029    Subjective  Pt reports her stomach and back on her L side are hurting a little bit from sleeping funny or something.    Pertinent History  scoliosis, pacemaker    Limitations  Sitting;House hold activities    How long can you sit comfortably?  30 minutes    How  long can you stand comfortably?  no issues    How long can you walk comfortably?  no issues    Diagnostic tests  none    Patient Stated Goals  move around and do what I can normally do without any pain    Currently in Pain?  No/denies           [facility] Adult PT Treatment/Exercise - 03/05/19 0001      Therapeutic Activites    Therapeutic Activities  Other Therapeutic Activities    Other Therapeutic Activities  Reaching above head to retrieve objects, stooping to floor to retrieve objects, turning while holding objects and carrying across room, 8 minutes      Lumbar Exercises: Stretches   Active Hamstring Stretch  3 reps;30 seconds    Active Hamstring Stretch Limitations  standing, 4" step  Other Lumbar Stretch Exercise  thoracic rotation, bolster between knees, dowel at chest, 10x5 sec holds    Other Lumbar Stretch Exercise  3D thoracic excursions in standing, x10 reps      Lumbar Exercises: Aerobic   UBE (Upper Arm Bike)  level 1, posterior for posture, 2 minutes, speed 4-5      Lumbar Exercises: Standing   Row  15 reps    Theraband Level (Row)  Level 2 (Red)    Shoulder Extension  10 reps    Theraband Level (Shoulder Extension)  Level 2 (Red)    Other Standing Lumbar Exercises  split stance, BUE flexion with 3# bar, x10 reps each LE back    Other Standing Lumbar Exercises  marching, 3 sec hold, x10 reps each LE      Lumbar Exercises: Seated   Sit to Stand  10 reps    Sit to Stand Limitations  no UE assist, eccentric lowering             PT Education - 03/05/19 1114    Education Details  Updated HEP, exercise technique, d/c next week so attempting to finalize HEP over the next few sessions    Person(s) Educated  Patient    Methods  Explanation;Demonstration;Handout    Comprehension  Verbalized understanding;Returned demonstration       PT Short Term Goals - 02/27/19 1012      PT SHORT TERM GOAL #1   Title  Pt will be independent with HEP, perform consistently  with correct form and update PRN.    Baseline  02/27/19:  reports compliance 3-4 days a week    Status  Achieved        PT Long Term Goals - 02/27/19 1012      PT LONG TERM GOAL #1   Title  Pt will perform SLS and tandem stance for 15 sec or > bilaterally to reduce risk for falls.    Baseline  02/27/19:  Only able to hold  SLS 1" max Bil.  Requires HHA wiht tandem stance 8" top with Lt LE behind, 4" with Rt behind    Status  On-going      PT LONG TERM GOAL #2   Title  Pt will perform 5x STS in 11.4 seec or < to demonstrate reduced pain with functional activities and return to PLOF.    Baseline  02/27/19: 5STS 9.84" no HHA    Status  Achieved      PT LONG TERM GOAL #3   Title  Pt will complete 3MWT with gait speed of 0.17m/s or > to demo improved gait mechanics, functional strength, and reduce risk for falls.    Baseline  02/27/19: .56m/s    Status  Achieved      PT LONG TERM GOAL #4   Title  Pt will improve overall BLE strength by 1 MMT grade to improve gait mechanics and ability to perform household chores.    Baseline  02/27/19: see MMT    Status  On-going      PT LONG TERM GOAL #5   Title  Pt will self report 0/10 pain at worst with funcitonal activities around the house to improve QoL and return to PLOF.            Plan - 03/05/19 1030    Clinical Impression Statement  Added UBE with posterior pull for posture training, only able to tolerate 2 minutes with speed 4-5 due to decreased endurance. Attmpted tandem stance with  BUE flexion, but unable to maintain balance so performed with split stance and mild unsteadiness. Pt with increased difficulty performing standing rows this date requiring verbal and tactile cues to improve form. Added functional activities training practicing carrying objects across room, stooping to floor and reaching overhead to retrieve objects and turning with objects in hand to practice navigating home for clothing, kitchen management and overall safety with  household chores. Added stretching to hamstring in standing position due to positive results and added to HEP. Pt reports no pain at EOS with new exercises. Continue to progress as able.    Personal Factors and Comorbidities  Comorbidity 3+;Time since onset of injury/illness/exacerbation    Comorbidities  see above    Examination-Activity Limitations  Reach Overhead;Sit    Examination-Participation Restrictions  Cleaning;Driving    Stability/Clinical Decision Making  Stable/Uncomplicated    Rehab Potential  Good    PT Frequency  2x / week    PT Duration  2 weeks    PT Treatment/Interventions  ADLs/Self Care Home Management;Aquatic Therapy;Biofeedback;Cryotherapy;Moist Heat;DME Instruction;Gait training;Stair training;Functional mobility training;Therapeutic activities;Therapeutic exercise;Balance training;Neuromuscular re-education;Patient/family education;Orthotic Fit/Training;Manual techniques;Passive range of motion;Dry needling;Taping;Joint Manipulations    PT Next Visit Plan  Continue BLE strengthening, balance exercises and gait/endurance training. Functional activity training for balance and technique training. Add to HEP.    PT Home Exercise Plan  02/19/19: LTR and bridge; 02/27/19: bridge, STS, SLS and standing abduciton insturcted to complete infront of sink/counter; 9/9: HS stretch on step with UE support    Consulted and Agree with Plan of Care  Patient       Patient will benefit from skilled therapeutic intervention in order to improve the following deficits and impairments:  Abnormal gait, Cardiopulmonary status limiting activity, Decreased activity tolerance, Decreased balance, Decreased endurance, Decreased range of motion, Decreased strength, Hypomobility, Increased fascial restricitons, Increased muscle spasms, Impaired perceived functional ability, Impaired flexibility, Improper body mechanics, Postural dysfunction, Pain  Visit Diagnosis: Acute bilateral low back pain without  sciatica  Abnormal posture  Other abnormalities of gait and mobility  Muscle weakness (generalized)     Problem List Patient Active Problem List   Diagnosis Date Noted  . Atrial fibrillation with rapid ventricular response ([place]) 06/29/2017  . Acute bronchitis 05/24/2017  . CAD (coronary artery disease) 05/17/2017  . Elevated troponin   . Acute systolic heart failure ([place])   . Non-ST elevation (NSTEMI) myocardial infarction ([place])   . Non-ischemic cardiomyopathy ([place])   . Acute on chronic combined systolic and diastolic CHF (congestive heart failure) ([place]) 05/10/2017  . Persistent atrial fibrillation 05/09/2017  . LLQ pain 01/04/2017  . ILD (interstitial lung disease) ([place]) 11/21/2016  . Hemorrhoids 08/21/2016  . IDA (iron deficiency anemia) 01/11/2016  . Diverticulosis of colon without hemorrhage   . Abdominal pain, right upper quadrant 10/29/2015  . Rectal bleeding 10/29/2015  . Normocytic anemia 10/29/2015  . Dysphagia   . Abnormality of esophagus 04/29/2015  . Esophageal dysphagia 03/26/2015  . OSA (obstructive sleep apnea) 12/31/2013  . Atrial fibrillation with RVR ([place]) 11/09/2013  . CKD (chronic kidney disease), stage III ([place]) 11/09/2013  . Hyperkalemia 11/07/2013  . Diastolic dysfunction [id number]  . DM (diabetes mellitus) ([place]) 11/07/2013  . Varicose veins of lower extremities with ulcer ([place]) 10/17/2013  . Cold-Left Hand / Foot 10/08/2013  . Circulation problem-Left foot 10/08/2013  . Atherosclerosis of native arteries of the extremities with ulceration(440.23) 10/08/2013  . A-fib ([place]) 08/05/2013  . Constipation 04/11/2013  . Peripheral vascular disease, unspecified ([place]) 10/27/2011  .  Varicose veins of bilateral lower extremities with other complications 0000000  . COLLAGENOUS COLITIS 04/08/2010  . GERD 02/18/2010  . HYPERTHYROIDISM 02/11/2010    Talbot Grumbling PT, DPT 03/05/19, 11:17 AM [facility] [address] [place], [place], [address] Phone: [phone]   Fax:  [phone]  Name: [name] MRN: [id number] Date of Birth: January 01, 1956

## 2019-03-07 ENCOUNTER — Ambulatory Visit (HOSPITAL_COMMUNITY): Payer: PPO

## 2019-03-07 ENCOUNTER — Other Ambulatory Visit: Payer: Self-pay

## 2019-03-07 ENCOUNTER — Encounter (HOSPITAL_COMMUNITY): Payer: Self-pay

## 2019-03-07 DIAGNOSIS — M545 Low back pain, unspecified: Secondary | ICD-10-CM

## 2019-03-07 DIAGNOSIS — M6281 Muscle weakness (generalized): Secondary | ICD-10-CM

## 2019-03-07 DIAGNOSIS — R293 Abnormal posture: Secondary | ICD-10-CM

## 2019-03-07 DIAGNOSIS — R2689 Other abnormalities of gait and mobility: Secondary | ICD-10-CM

## 2019-03-07 DIAGNOSIS — S80812A Abrasion, left lower leg, initial encounter: Secondary | ICD-10-CM | POA: Diagnosis not present

## 2019-03-07 NOTE — Therapy (Signed)
[place], [place], [address] Phone: [phone]   Fax:  [phone]  Physical Therapy Treatment  Patient Details  Name: [name] MRN: [id number] Date of Birth: Feb 03, 1956 Referring Provider (PT): Allyn Kenner, MD   Encounter Date: 03/07/2019  PT End of Session - 03/07/19 1428    Visit Number  6    Number of Visits  8    Date for PT Re-Evaluation  03/13/19   reassessment completed 9/3, visit #4   Authorization Type  [employer]    Authorization Time Period  02/13/19 to 02/28/19; NEW: 02/28/19 to 03/13/19    Authorization - Visit Number  3    Authorization - Number of Visits  10    PT Start Time  1350    PT Stop Time  1430    PT Time Calculation (min)  40 min    Equipment Utilized During Treatment  Gait belt    Activity Tolerance  Patient tolerated treatment well    Behavior During Therapy  [facility] for tasks assessed/performed       Past Medical History:  Diagnosis Date  . Amiodarone toxicity   . Anemia   . Chronic bronchitis   . Chronic systolic CHF (congestive heart failure) ([place])    a. 04/2017 Echo: EF 25%.  . Congenital heart disease   . Diabetes mellitus without complication ([place])    Type II  . Diverticulosis   . Fall due to stumbling October 08, 2013   Due to shoes  . GERD (gastroesophageal reflux disease)   . Gout   . Hemorrhoids   . Hyperlipidemia   . Hyperparathyroidism   . Hypertension   . Hypothyroidism   . IDA (iron deficiency anemia)   . Microscopic colitis 9/11   Colonoscopy  . NICM (nonischemic cardiomyopathy) - tachycardia induced in the setting of AFib ([place])    a. 04/2017 Echo: EF 25%, basal-mid inferolateral, inf AK, triv AI, mild MR, sev dil LA, mod red RV fxn, mildly dil RA, mild to mod TR, PASP 55mmHg.  . Non-obstructive CAD (coronary artery disease)    a. 04/2017 Cath: LM 20p, LCX nl, RCA 20p/m.  Marland Kitchen Paroxysmal SVT (supraventricular tachycardia) ([place])   .  Persistent atrial fibrillation 06/2013   a. 04/2017 Admit with CHF/NICM/AFib -> CHA2DS2VASc = 4-->Xarelto 15 mg daily started; b. 06/2017 s/p BiV PPM (SJM ser # [id number]) and AVN ablation.  . Presence of permanent cardiac pacemaker 06/29/2017   Biventricular pacemaker placed  . PVD (peripheral vascular disease) ([place]) 09/2004   Right common femoral endarterectomy in April of 2007  . Renal insufficiency   . Schatzki's ring    Last EGD with esophageal dilatation 50F  9/11  . Scoliosis   . Varicose veins right leg pain and swelling    Past Surgical History:  Procedure Laterality Date  . ASD REPAIR  Age 74   [facility]  . AV NODE ABLATION N/A 06/29/2017   Procedure: AV NODE ABLATION;  Surgeon: Evans Lance, MD;  Location: [place] [facility];  Service: Cardiovascular;  Laterality: N/A;  . BIOPSY  02/13/2017   Procedure: BIOPSY;  Surgeon: Daneil Dolin, MD;  [facility];  Service: Endoscopy;;  duodenum gastric  . BIV PACEMAKER INSERTION CRT-P N/A 06/29/2017   Procedure: BIV PACEMAKER INSERTION CRT-P;  Surgeon: Evans Lance, MD;  Location: [place] [facility];  Service: Cardiovascular;  Laterality: N/A;  .  CARDIOVERSION N/A 11/13/2013   Procedure: CARDIOVERSION;  Surgeon: Herminio Commons, MD;  Location: [facility];  Service: Endoscopy;  Laterality: N/A;  . COLONOSCOPY  03/04/2010   anal canal hemorrhoids otherwise normal. TI normal. Bx showed lymphocytic colitis. next TCS 02/2020  . COLONOSCOPY  April 2008   [place]hman: Pancolonic diverticulosis, external hemorrhoids  . COLONOSCOPY N/A 11/30/2015   RMR: Normal terminal ileum for 10 cm, nonbleeding grade 1 internal hemorrhoids, colonic diverticulosis. Next colonoscopy in 2027.  Marland Kitchen COLONOSCOPY WITH PROPOFOL N/A 02/24/2019   Procedure: COLONOSCOPY WITH PROPOFOL;  Surgeon: Daneil Dolin, MD;  [facility];  Service: Endoscopy;  Laterality: N/A;  11:15am  . ELAS  06-01-11   Right saphenous ELAS   .  ESOPHAGEAL DILATION N/A 05/28/2015   Procedure: ESOPHAGEAL DILATION;  Surgeon: Daneil Dolin, MD;  [facility];  Service: Endoscopy;  Laterality: N/A;  . ESOPHAGOGASTRODUODENOSCOPY  03/04/2010   noncritical appearing Schatzki ring. SB bx negative  . ESOPHAGOGASTRODUODENOSCOPY  05/2008   erosive reflux esophagatitis, noncritical Schatzki ring  . ESOPHAGOGASTRODUODENOSCOPY N/A 05/28/2015   [id number] esophagus somewhat baggy, likely due to underlying esophageal motility disorder/small HH   . ESOPHAGOGASTRODUODENOSCOPY N/A 02/13/2017   Dr. Gala Romney: subtly abnormal stomach of doubtful clinical significant s/p biopsy, normal duodenum s/p biopsy, fundic gland polyp on path, normal duodenal biopsy  . ESOPHAGOGASTRODUODENOSCOPY (EGD) WITH PROPOFOL N/A 02/24/2019   Procedure: ESOPHAGOGASTRODUODENOSCOPY (EGD) WITH PROPOFOL;  Surgeon: Daneil Dolin, MD;  [facility];  Service: Endoscopy;  Laterality: N/A;  . [place] SURGERY  2010  . Left hemithyroidectomy    . Left parathyroidectomy  2007  . MALONEY DILATION N/A 02/24/2019   Procedure: Venia Minks DILATION;  Surgeon: Daneil Dolin, MD;  [facility];  Service: Endoscopy;  Laterality: N/A;  . Parathyroid adenoma  2007  . Right common femoral endarterectomy  2007  . RIGHT/LEFT HEART CATH AND CORONARY ANGIOGRAPHY N/A 05/15/2017   Procedure: RIGHT/LEFT HEART CATH AND CORONARY ANGIOGRAPHY;  Surgeon: Troy Sine, MD;  Location: [place] [facility];  Service: Cardiovascular;  Laterality: N/A;  . Small bowel capsule  12 2009   Mid to distal small bowel with edema, erosions, tiny ulceration felt to be NSAID related  . TONSILLECTOMY      There were no vitals filed for this visit.  Subjective Assessment - 03/07/19 1354    Subjective  Pt reports she dropped a bottle on her leg and it created a gash so she went to ther doctor because it kepy leaking water and now she has some antibacterial cream to use.    Pertinent History   scoliosis, pacemaker    Limitations  Sitting;House hold activities    How long can you sit comfortably?  30 minutes    How long can you stand comfortably?  no issues    How long can you walk comfortably?  no issues    Diagnostic tests  none    Patient Stated Goals  move around and do what I can normally do without any pain    Currently in Pain?  No/denies          [facility] Adult PT Treatment/Exercise - 03/07/19 0001      Lumbar Exercises: Stretches   Active Hamstring Stretch  3 reps;30 seconds    Active Hamstring Stretch Limitations  supine, hands hold behind knee    Lower Trunk Rotation  10 seconds    Lower Trunk Rotation Limitations  10 reps  Lumbar Exercises: Standing   Functional Squats  10 reps    Functional Squats Limitations  chair taps for tactile cues    Row  15 reps    Theraband Level (Row)  Level 3 (Green)    Shoulder Extension  15 reps    Theraband Level (Shoulder Extension)  Level 3 (Green)    Other Standing Lumbar Exercises  split stance, BUE flexion with 3# bar, x10 reps each LE back; heel taps from 4" box, x10 reps each LE; chops, GTB, x10 each direction    Other Standing Lumbar Exercises  marching, 1 sec hold, x10 reps each LE; forward and lateral step ups, 4" box, single UE assist, x10 reps each LE   unable to hold for >1 sec     Lumbar Exercises: Seated   Sit to Stand  10 reps    Sit to Stand Limitations  no UE assist, eccentric lowering             PT Education - 03/07/19 1437    Education Details  Updated HEP, exercise technique, increasing walking program at home    Person(s) Educated  Patient    Methods  Explanation;Demonstration;Handout    Comprehension  Verbalized understanding;Returned demonstration       PT Short Term Goals - 02/27/19 1012      PT SHORT TERM GOAL #1   Title  Pt will be independent with HEP, perform consistently with correct form and update PRN.    Baseline  02/27/19:  reports compliance 3-4 days a week    Status   Achieved        PT Long Term Goals - 02/27/19 1012      PT LONG TERM GOAL #1   Title  Pt will perform SLS and tandem stance for 15 sec or > bilaterally to reduce risk for falls.    Baseline  02/27/19:  Only able to hold  SLS 1" max Bil.  Requires HHA wiht tandem stance 8" top with Lt LE behind, 4" with Rt behind    Status  On-going      PT LONG TERM GOAL #2   Title  Pt will perform 5x STS in 11.4 seec or < to demonstrate reduced pain with functional activities and return to PLOF.    Baseline  02/27/19: 5STS 9.84" no HHA    Status  Achieved      PT LONG TERM GOAL #3   Title  Pt will complete 3MWT with gait speed of 0.77m/s or > to demo improved gait mechanics, functional strength, and reduce risk for falls.    Baseline  02/27/19: .61m/s    Status  Achieved      PT LONG TERM GOAL #4   Title  Pt will improve overall BLE strength by 1 MMT grade to improve gait mechanics and ability to perform household chores.    Baseline  02/27/19: see MMT    Status  On-going      PT LONG TERM GOAL #5   Title  Pt will self report 0/10 pain at worst with funcitonal activities around the house to improve QoL and return to PLOF.            Plan - 03/07/19 1429    Clinical Impression Statement  Continued progressing pt with BLE strengthening this date. Added functional squats with chair tapping for tactile cues for form and step ups to 4inch box for specific LE strengthening. Pt unable to maintain SLS for isometric marches this  date for longer than 1 second. Pt requires single UE assist for step ups for balance support. Pt with good form performing standing rows and lat pull down/shoulder extension, progressed to HEP this date due to good technique. Pt continues to require occasional verbal cues for form, but without pain complaints throughout session and none at EOS. Continue to progress as able.    Personal Factors and Comorbidities  Comorbidity 3+;Time since onset of injury/illness/exacerbation     Comorbidities  see above    Examination-Activity Limitations  Reach Overhead;Sit    Examination-Participation Restrictions  Cleaning;Driving    Stability/Clinical Decision Making  Stable/Uncomplicated    Rehab Potential  Good    PT Frequency  2x / week    PT Duration  2 weeks    PT Treatment/Interventions  ADLs/Self Care Home Management;Aquatic Therapy;Biofeedback;Cryotherapy;Moist Heat;DME Instruction;Gait training;Stair training;Functional mobility training;Therapeutic activities;Therapeutic exercise;Balance training;Neuromuscular re-education;Patient/family education;Orthotic Fit/Training;Manual techniques;Passive range of motion;Dry needling;Taping;Joint Manipulations    PT Next Visit Plan  Continue BLE strengthening, balance exercises and gait/endurance training. Functional activity training for balance and technique training. Finalize HEP and prepare for d/c.    PT Home Exercise Plan  02/19/19: LTR and bridge; 02/27/19: bridge, STS, SLS and standing abduciton insturcted to complete infront of sink/counter; 9/9: HS stretch on step with UE support; 9/11: rows and shoulder extension GTB    Consulted and Agree with Plan of Care  Patient       Patient will benefit from skilled therapeutic intervention in order to improve the following deficits and impairments:  Abnormal gait, Cardiopulmonary status limiting activity, Decreased activity tolerance, Decreased balance, Decreased endurance, Decreased range of motion, Decreased strength, Hypomobility, Increased fascial restricitons, Increased muscle spasms, Impaired perceived functional ability, Impaired flexibility, Improper body mechanics, Postural dysfunction, Pain  Visit Diagnosis: Acute bilateral low back pain without sciatica  Abnormal posture  Other abnormalities of gait and mobility  Muscle weakness (generalized)     Problem List Patient Active Problem List   Diagnosis Date Noted  . Atrial fibrillation with rapid ventricular response  ([place]) 06/29/2017  . Acute bronchitis 05/24/2017  . CAD (coronary artery disease) 05/17/2017  . Elevated troponin   . Acute systolic heart failure ([place])   . Non-ST elevation (NSTEMI) myocardial infarction ([place])   . Non-ischemic cardiomyopathy ([place])   . Acute on chronic combined systolic and diastolic CHF (congestive heart failure) ([place]) 05/10/2017  . Persistent atrial fibrillation 05/09/2017  . LLQ pain 01/04/2017  . ILD (interstitial lung disease) ([place]) 11/21/2016  . Hemorrhoids 08/21/2016  . IDA (iron deficiency anemia) 01/11/2016  . Diverticulosis of colon without hemorrhage   . Abdominal pain, right upper quadrant 10/29/2015  . Rectal bleeding 10/29/2015  . Normocytic anemia 10/29/2015  . Dysphagia   . Abnormality of esophagus 04/29/2015  . Esophageal dysphagia 03/26/2015  . OSA (obstructive sleep apnea) 12/31/2013  . Atrial fibrillation with RVR ([place]) 11/09/2013  . CKD (chronic kidney disease), stage III ([place]) 11/09/2013  . Hyperkalemia 11/07/2013  . Diastolic dysfunction [id number]  . DM (diabetes mellitus) ([place]) 11/07/2013  . Varicose veins of lower extremities with ulcer ([place]) 10/17/2013  . Cold-Left Hand / Foot 10/08/2013  . Circulation problem-Left foot 10/08/2013  . Atherosclerosis of native arteries of the extremities with ulceration(440.23) 10/08/2013  . A-fib ([place]) 08/05/2013  . Constipation 04/11/2013  . Peripheral vascular disease, unspecified ([place]) 10/27/2011  . Varicose veins of bilateral lower extremities with other complications 0000000  . COLLAGENOUS COLITIS 04/08/2010  . GERD 02/18/2010  . HYPERTHYROIDISM 02/11/2010  Talbot Grumbling PT, DPT 03/07/19, 2:38 PM [place] [address] [place], [place], [address] Phone: [phone]   Fax:  [phone]  Name: [name] MRN: [id number] Date of Birth: [birth year]

## 2019-03-10 ENCOUNTER — Other Ambulatory Visit: Payer: Self-pay

## 2019-03-10 ENCOUNTER — Encounter (HOSPITAL_COMMUNITY): Payer: Self-pay

## 2019-03-10 ENCOUNTER — Ambulatory Visit (HOSPITAL_COMMUNITY): Payer: PPO

## 2019-03-10 DIAGNOSIS — M545 Low back pain, unspecified: Secondary | ICD-10-CM

## 2019-03-10 DIAGNOSIS — R2689 Other abnormalities of gait and mobility: Secondary | ICD-10-CM

## 2019-03-10 DIAGNOSIS — M6281 Muscle weakness (generalized): Secondary | ICD-10-CM

## 2019-03-10 DIAGNOSIS — R293 Abnormal posture: Secondary | ICD-10-CM

## 2019-03-10 NOTE — Therapy (Signed)
[place], [place], [address] Phone: [phone]   Fax:  [phone]  Physical Therapy Treatment  Patient Details  Name: [name] MRN: [id number] Date of Birth: [birth year] Referring Provider (PT): Allyn Kenner, MD   Encounter Date: 03/10/2019  PT End of Session - 03/10/19 1034    Visit Number  7    Number of Visits  8    Date for PT Re-Evaluation  03/13/19   reassessment completed 9/3, visit #4   Authorization Type  [employer]    Authorization Time Period  02/13/19 to 02/28/19; NEW: 02/28/19 to 03/13/19    Authorization - Visit Number  4    Authorization - Number of Visits  10    PT Start Time  1030    PT Stop Time  1111    PT Time Calculation (min)  41 min    Equipment Utilized During Treatment  Gait belt    Activity Tolerance  Patient tolerated treatment well    Behavior During Therapy  [facility] for tasks assessed/performed       Past Medical History:  Diagnosis Date  . Amiodarone toxicity   . Anemia   . Chronic bronchitis   . Chronic systolic CHF (congestive heart failure) ([place])    a. 04/2017 Echo: EF 25%.  . Congenital heart disease   . Diabetes mellitus without complication ([place])    Type II  . Diverticulosis   . Fall due to stumbling October 08, 2013   Due to shoes  . GERD (gastroesophageal reflux disease)   . Gout   . Hemorrhoids   . Hyperlipidemia   . Hyperparathyroidism   . Hypertension   . Hypothyroidism   . IDA (iron deficiency anemia)   . Microscopic colitis 9/11   Colonoscopy  . NICM (nonischemic cardiomyopathy) - tachycardia induced in the setting of AFib ([employer])    a. 04/2017 Echo: EF 25%, basal-mid inferolateral, inf AK, triv AI, mild MR, sev dil LA, mod red RV fxn, mildly dil RA, mild to mod TR, PASP 8mmHg.  . Non-obstructive CAD (coronary artery disease)    a. 04/2017 Cath: LM 20p, LCX nl, RCA 20p/m.  Marland Kitchen Paroxysmal SVT (supraventricular tachycardia) ([place])   .  Persistent atrial fibrillation 06/2013   a. 04/2017 Admit with CHF/NICM/AFib -> CHA2DS2VASc = 4-->Xarelto 15 mg daily started; b. 06/2017 s/p BiV PPM (SJM ser # [id number]) and AVN ablation.  . Presence of permanent cardiac pacemaker 06/29/2017   Biventricular pacemaker placed  . PVD (peripheral vascular disease) ([place]) 09/2004   Right common femoral endarterectomy in April of 2007  . Renal insufficiency   . Schatzki's ring    Last EGD with esophageal dilatation 37F  9/11  . Scoliosis   . Varicose veins right leg pain and swelling    Past Surgical History:  Procedure Laterality Date  . ASD REPAIR  Age 55   [facility]  . AV NODE ABLATION N/A 06/29/2017   Procedure: AV NODE ABLATION;  Surgeon: Evans Lance, MD;  Location: [facility];  Service: Cardiovascular;  Laterality: N/A;  . BIOPSY  02/13/2017   Procedure: BIOPSY;  Surgeon: Daneil Dolin, MD;  [facility];  Service: Endoscopy;;  duodenum gastric  . BIV PACEMAKER INSERTION CRT-P N/A 06/29/2017   Procedure: BIV PACEMAKER INSERTION CRT-P;  Surgeon: Evans Lance, MD;  Location: [facility];  Service: Cardiovascular;  Laterality: N/A;  .  CARDIOVERSION N/A 11/13/2013   Procedure: CARDIOVERSION;  Surgeon: Herminio Commons, MD;  Location: [facility];  Service: Endoscopy;  Laterality: N/A;  . COLONOSCOPY  03/04/2010   anal canal hemorrhoids otherwise normal. TI normal. Bx showed lymphocytic colitis. next TCS 02/2020  . COLONOSCOPY  April 2008   [place]hman: Pancolonic diverticulosis, external hemorrhoids  . COLONOSCOPY N/A 11/30/2015   RMR: Normal terminal ileum for 10 cm, nonbleeding grade 1 internal hemorrhoids, colonic diverticulosis. Next colonoscopy in 2027.  Marland Kitchen COLONOSCOPY WITH PROPOFOL N/A 02/24/2019   Procedure: COLONOSCOPY WITH PROPOFOL;  Surgeon: Daneil Dolin, MD;  [facility];  Service: Endoscopy;  Laterality: N/A;  11:15am  . ELAS  06-01-11   Right saphenous ELAS   .  ESOPHAGEAL DILATION N/A 05/28/2015   Procedure: ESOPHAGEAL DILATION;  Surgeon: Daneil Dolin, MD;  [facility];  Service: Endoscopy;  Laterality: N/A;  . ESOPHAGOGASTRODUODENOSCOPY  03/04/2010   noncritical appearing Schatzki ring. SB bx negative  . ESOPHAGOGASTRODUODENOSCOPY  05/2008   erosive reflux esophagatitis, noncritical Schatzki ring  . ESOPHAGOGASTRODUODENOSCOPY N/A 05/28/2015   [id number] esophagus somewhat baggy, likely due to underlying esophageal motility disorder/small HH   . ESOPHAGOGASTRODUODENOSCOPY N/A 02/13/2017   Dr. Gala Romney: subtly abnormal stomach of doubtful clinical significant s/p biopsy, normal duodenum s/p biopsy, fundic gland polyp on path, normal duodenal biopsy  . ESOPHAGOGASTRODUODENOSCOPY (EGD) WITH PROPOFOL N/A 02/24/2019   Procedure: ESOPHAGOGASTRODUODENOSCOPY (EGD) WITH PROPOFOL;  Surgeon: Daneil Dolin, MD;  [facility];  Service: Endoscopy;  Laterality: N/A;  . [place] SURGERY  2010  . Left hemithyroidectomy    . Left parathyroidectomy  2007  . MALONEY DILATION N/A 02/24/2019   Procedure: Venia Minks DILATION;  Surgeon: Daneil Dolin, MD;  [facility];  Service: Endoscopy;  Laterality: N/A;  . Parathyroid adenoma  2007  . Right common femoral endarterectomy  2007  . RIGHT/LEFT HEART CATH AND CORONARY ANGIOGRAPHY N/A 05/15/2017   Procedure: RIGHT/LEFT HEART CATH AND CORONARY ANGIOGRAPHY;  Surgeon: Troy Sine, MD;  Location: [facility];  Service: Cardiovascular;  Laterality: N/A;  . Small bowel capsule  12 2009   Mid to distal small bowel with edema, erosions, tiny ulceration felt to be NSAID related  . TONSILLECTOMY      There were no vitals filed for this visit.  Subjective Assessment - 03/10/19 1032    Subjective  Pt reports her stomach is bothering her and it eases after some time awake and walking around.    Pertinent History  scoliosis, pacemaker    Limitations  Sitting;House hold activities    How  long can you sit comfortably?  30 minutes    How long can you stand comfortably?  no issues    How long can you walk comfortably?  no issues    Diagnostic tests  none    Patient Stated Goals  move around and do what I can normally do without any pain    Currently in Pain?  No/denies           [facility] Adult PT Treatment/Exercise - 03/10/19 0001      Lumbar Exercises: Standing   Heel Raises  15 reps    Functional Squats  10 reps    Functional Squats Limitations  2 sets, chair taps for tactile cues    Shoulder Adduction Limitations  Foam stance in Romberg, tandem, single leg, x30 sec each, intermittent UE assist    Other Standing Lumbar Exercises  tandem stance,  BUE flexion with 5# bar, x10 reps each LE back; flat foot taps to cone, x10 reps each LE, intermittent UE assist; 3D vector stance, 3sec hold 5RT    Other Standing Lumbar Exercises  marching, 3 sec hold, x10 reps each LE; forward and lateral step ups, 6" box, single UE assist, x10 reps each LE             PT Education - 03/10/19 1034    Education Details  Exercise technique, updated HEP    Person(s) Educated  Patient    Methods  Explanation    Comprehension  Verbalized understanding       PT Short Term Goals - 02/27/19 1012      PT SHORT TERM GOAL #1   Title  Pt will be independent with HEP, perform consistently with correct form and update PRN.    Baseline  02/27/19:  reports compliance 3-4 days a week    Status  Achieved        PT Long Term Goals - 02/27/19 1012      PT LONG TERM GOAL #1   Title  Pt will perform SLS and tandem stance for 15 sec or > bilaterally to reduce risk for falls.    Baseline  02/27/19:  Only able to hold  SLS 1" max Bil.  Requires HHA wiht tandem stance 8" top with Lt LE behind, 4" with Rt behind    Status  On-going      PT LONG TERM GOAL #2   Title  Pt will perform 5x STS in 11.4 seec or < to demonstrate reduced pain with functional activities and return to PLOF.    Baseline  02/27/19:  5STS 9.84" no HHA    Status  Achieved      PT LONG TERM GOAL #3   Title  Pt will complete 3MWT with gait speed of 0.10m/s or > to demo improved gait mechanics, functional strength, and reduce risk for falls.    Baseline  02/27/19: .2m/s    Status  Achieved      PT LONG TERM GOAL #4   Title  Pt will improve overall BLE strength by 1 MMT grade to improve gait mechanics and ability to perform household chores.    Baseline  02/27/19: see MMT    Status  On-going      PT LONG TERM GOAL #5   Title  Pt will self report 0/10 pain at worst with funcitonal activities around the house to improve QoL and return to PLOF.            Plan - 03/10/19 1119    Clinical Impression Statement  Progressed step ups to 6" box this session with more difficulty R>L due to weakness, but improved with verbal cues to push through flat R foot to engage glutes and quads. Pt performs tandem stance with BUE flexion to challenge balance with increased difficulty with RLE back. Pt able to maintain SLS for 3 sec intervals with isometric marching holds this date. Progressed standing balance activities with flat foot cone taps and vector stance, pt requiring intermittent UE assist to maintain upright trunk. Increased dependence on UE support with vector stance requiring significant education on maintaining upright trunk and decreasing grip on bar to improve strength in LEs to maintain static standing. Pt attempted standing balance challenge on foam surface, with good balance in Romberg stance, increasing difficulty in Romberg stance and requires intermittent to constant single UE assist for SLS on foam surface.  Plan to d/c next session and finalize HEP due to progress made with skilled PT thus far and independence with exercises.    Personal Factors and Comorbidities  Comorbidity 3+;Time since onset of injury/illness/exacerbation    Comorbidities  see above    Examination-Activity Limitations  Reach Overhead;Sit     Examination-Participation Restrictions  Cleaning;Driving    Stability/Clinical Decision Making  Stable/Uncomplicated    Rehab Potential  Good    PT Frequency  2x / week    PT Duration  2 weeks    PT Treatment/Interventions  ADLs/Self Care Home Management;Aquatic Therapy;Biofeedback;Cryotherapy;Moist Heat;DME Instruction;Gait training;Stair training;Functional mobility training;Therapeutic activities;Therapeutic exercise;Balance training;Neuromuscular re-education;Patient/family education;Orthotic Fit/Training;Manual techniques;Passive range of motion;Dry needling;Taping;Joint Manipulations    PT Next Visit Plan  Finalize HEP and discharge.    PT Home Exercise Plan  02/19/19: LTR and bridge; 02/27/19: bridge, STS, SLS and standing abduciton insturcted to complete infront of sink/counter; 9/9: HS stretch on step with UE support; 9/11: rows and shoulder extension GTB; 9/14: squats with chair tap for cues, tandem stance with counter support    Consulted and Agree with Plan of Care  Patient       Patient will benefit from skilled therapeutic intervention in order to improve the following deficits and impairments:  Abnormal gait, Cardiopulmonary status limiting activity, Decreased activity tolerance, Decreased balance, Decreased endurance, Decreased range of motion, Decreased strength, Hypomobility, Increased fascial restricitons, Increased muscle spasms, Impaired perceived functional ability, Impaired flexibility, Improper body mechanics, Postural dysfunction, Pain  Visit Diagnosis: Acute bilateral low back pain without sciatica  Abnormal posture  Other abnormalities of gait and mobility  Muscle weakness (generalized)     Problem List Patient Active Problem List   Diagnosis Date Noted  . Atrial fibrillation with rapid ventricular response ([place]) 06/29/2017  . Acute bronchitis 05/24/2017  . CAD (coronary artery disease) 05/17/2017  . Elevated troponin   . Acute systolic heart failure ([place])    . Non-ST elevation (NSTEMI) myocardial infarction ([place])   . Non-ischemic cardiomyopathy ([place])   . Acute on chronic combined systolic and diastolic CHF (congestive heart failure) ([place]) 05/10/2017  . Persistent atrial fibrillation 05/09/2017  . LLQ pain 01/04/2017  . ILD (interstitial lung disease) ([place]) 11/21/2016  . Hemorrhoids 08/21/2016  . IDA (iron deficiency anemia) 01/11/2016  . Diverticulosis of colon without hemorrhage   . Abdominal pain, right upper quadrant 10/29/2015  . Rectal bleeding 10/29/2015  . Normocytic anemia 10/29/2015  . Dysphagia   . Abnormality of esophagus 04/29/2015  . Esophageal dysphagia 03/26/2015  . OSA (obstructive sleep apnea) 12/31/2013  . Atrial fibrillation with RVR ([place]) 11/09/2013  . CKD (chronic kidney disease), stage III ([place]) 11/09/2013  . Hyperkalemia 11/07/2013  . Diastolic dysfunction [id number]  . DM (diabetes mellitus) ([place]) 11/07/2013  . Varicose veins of lower extremities with ulcer ([place]) 10/17/2013  . Cold-Left Hand / Foot 10/08/2013  . Circulation problem-Left foot 10/08/2013  . Atherosclerosis of native arteries of the extremities with ulceration(440.23) 10/08/2013  . A-fib ([place]) 08/05/2013  . Constipation 04/11/2013  . Peripheral vascular disease, unspecified ([place]) 10/27/2011  . Varicose veins of bilateral lower extremities with other complications 0000000  . COLLAGENOUS COLITIS 04/08/2010  . GERD 02/18/2010  . HYPERTHYROIDISM 02/11/2010     Talbot Grumbling PT, DPT 03/10/19, 11:22 AM [place] [address] [place], [place], [address] Phone: [phone]   Fax:  [phone]  Name: [name] MRN: [id number] Date of Birth: 1956-06-21

## 2019-03-11 ENCOUNTER — Ambulatory Visit (INDEPENDENT_AMBULATORY_CARE_PROVIDER_SITE_OTHER): Payer: PPO | Admitting: Adult Health

## 2019-03-11 ENCOUNTER — Ambulatory Visit (INDEPENDENT_AMBULATORY_CARE_PROVIDER_SITE_OTHER): Payer: PPO

## 2019-03-11 ENCOUNTER — Encounter: Payer: Self-pay | Admitting: Adult Health

## 2019-03-11 VITALS — BP 126/74 | HR 71 | Temp 98.2°F | Ht 58.5 in | Wt 148.8 lb

## 2019-03-11 DIAGNOSIS — J4520 Mild intermittent asthma, uncomplicated: Secondary | ICD-10-CM

## 2019-03-11 DIAGNOSIS — I5021 Acute systolic (congestive) heart failure: Secondary | ICD-10-CM | POA: Diagnosis not present

## 2019-03-11 DIAGNOSIS — R0600 Dyspnea, unspecified: Secondary | ICD-10-CM | POA: Diagnosis not present

## 2019-03-11 DIAGNOSIS — J90 Pleural effusion, not elsewhere classified: Secondary | ICD-10-CM | POA: Diagnosis not present

## 2019-03-11 DIAGNOSIS — I5043 Acute on chronic combined systolic (congestive) and diastolic (congestive) heart failure: Secondary | ICD-10-CM

## 2019-03-11 DIAGNOSIS — J45909 Unspecified asthma, uncomplicated: Secondary | ICD-10-CM | POA: Insufficient documentation

## 2019-03-11 MED ORDER — FUROSEMIDE 40 MG PO TABS: 40.0000 mg | ORAL_TABLET | Freq: Every day | ORAL | 0 refills | Status: DC

## 2019-03-11 NOTE — Progress Notes (Signed)
_0  ID: [name], female    DOB: [birth year], 63 y.o.   MRN: [id number]  Chief Complaint  Patient presents with   Follow-up    Asthma     Referring provider: Celene Squibb, MD  HPI: 63 year old female never smoker seen for pulmonary consult Nov 21, 2016 for cough and shortness of breath felt related to CHF  Carries a diagnosis of asthma. Medical history significant for chronic A. fib status post ablation and PPM, CHF, tachycardia mediated cardiomyopathy Mild OSA CPAP intolerance  TEST/EVENTS :  Echo June 2018, EF 30%,ZS0 diastolic dysfunction,severely dilated left atrium moderately dilated. Right atrium, pulmonary pressure 52 mmHg.  Marland Kitchen11/2018Echo showed EF at 25%. Cath showed nonobstructive CAD . RHC showed mild elevated PAP     PFT 11/2016 FVC 64%, DLCO 61% PFT11/2018shows stable lung function . FEV1 67% , ratio 85, FVC 60%. + BD response, DLCO 56%.  PFT 06/2018 FVC 55%, ratio 80, DLCO 48%  05/2017 HR CT chest shows small right effusion and marked improvement in groundglass opacities bilaterally, no ILD  03/11/2019 Follow up : Asthma , Dyspnea  Patient returns for a follow-up visit.  Last seen January 2020.  Patient has underlying congestive heart failure echo November 2018 showed EF of 25%.  CT chest in December 2018 showed no evidence of ILD with a small right effusion.  Her shortness of breath and previous infiltrates on chest x-ray felt to be edema related.  Patient had previously been on amiodarone and was discontinued due to pulmonary infiltrates. Previous pulmonary function testing showed moderate restriction with a diffusing defect.  She does carry a diagnosis of possible asthma and is on Breo.  She says overall breathing has been doing some better but over the last few weeks has had more shortness of breath weight is in increased a few pounds and she has more lower extremity swelling.  She is on Lasix 40 mg daily.  She remains on Xarelto for her A.  Fib. She denies any fever, chest pain, orthopnea.    Allergies  Allergen Reactions   Flecainide Nausea Only and Other (See Comments)    Faint feeling   Hydrocodone-Acetaminophen Nausea Only and Other (See Comments)    Severe headache   Ibuprofen Other (See Comments)    Kidney dysfunction   Oxycodone Hcl Nausea Only and Other (See Comments)    Headache   Penicillins Nausea Only and Other (See Comments)    Severe headache Has patient had a PCN reaction causing immediate rash, facial/tongue/throat swelling, SOB or lightheadedness with hypotension: No Has patient had a PCN reaction causing severe rash involving mucus membranes or skin necrosis: No Has patient had a PCN reaction that required hospitalization: No Has patient had a PCN reaction occurring within the last 10 years: No If all of the above answers are "NO", then may proceed with Cephalosporin use.     Immunization History  Administered Date(s) Administered   Influenza Split 03/25/2016, 03/25/2017, 03/06/2018   Influenza-Unspecified 03/07/2014   Pneumococcal Conjugate-13 04/26/2015   Zoster 05/25/2016    Past Medical History:  Diagnosis Date   Amiodarone toxicity    Anemia    Chronic bronchitis    Chronic systolic CHF (congestive heart failure) ([place])    a. 04/2017 Echo: EF 25%.   Congenital heart disease    Diabetes mellitus without complication ([place])    Type II   Diverticulosis    Fall due to stumbling October 08, 2013   Due to shoes  GERD (gastroesophageal reflux disease)    Gout    Hemorrhoids    Hyperlipidemia    Hyperparathyroidism    Hypertension    Hypothyroidism    IDA (iron deficiency anemia)    Microscopic colitis 9/11   Colonoscopy   NICM (nonischemic cardiomyopathy) - tachycardia induced in the setting of AFib ([place])    a. 04/2017 Echo: EF 25%, basal-mid inferolateral, inf AK, triv AI, mild MR, sev dil LA, mod red RV fxn, mildly dil RA, mild to mod TR, PASP 51mHg.    Non-obstructive CAD (coronary artery disease)    a. 04/2017 Cath: LM 20p, LCX nl, RCA 20p/m.   Paroxysmal SVT (supraventricular tachycardia) (HCC)    Persistent atrial fibrillation 06/2013   a. 04/2017 Admit with CHF/NICM/AFib -> CHA2DS2VASc = 4-->Xarelto 15 mg daily started; b. 06/2017 s/p BiV PPM ([facility] ser # [id number] and AVN ablation.   Presence of permanent cardiac pacemaker 06/29/2017   Biventricular pacemaker placed   PVD (peripheral vascular disease) ([place] 09/2004   Right common femoral endarterectomy in April of 2007   Renal insufficiency    Schatzki's ring    Last EGD with esophageal dilatation 13F  9/11   Scoliosis    Varicose veins right leg pain and swelling    Tobacco History: Social History   Tobacco Use  Smoking Status Never Smoker  Smokeless Tobacco Never Used  Tobacco Comment   Never smoked   Counseling given: Not Answered Comment: Never smoked   Outpatient Medications Prior to Visit  Medication Sig Dispense Refill   acetaminophen (TYLENOL) 500 MG tablet Take 1,000 mg by mouth every 6 (six) hours as needed (for pain.).      BREO ELLIPTA 100-25 MCG/INH AEPB Inhale 2 puffs into the lungs daily.     cyclobenzaprine (FLEXERIL) 5 MG tablet Take 5 mg by mouth 3 (three) times daily as needed for muscle spasms.      ferrous sulfate (FEOSOL) 325 (65 FE) MG tablet Take 325 mg by mouth daily with breakfast.      gabapentin (NEURONTIN) 100 MG capsule Take 100 mg by mouth 2 (two) times daily.      hydrALAZINE (APRESOLINE) 25 MG tablet Take 0.5 tablets (12.5 mg total) by mouth every 8 (eight) hours. 45 tablet 6   isosorbide mononitrate (IMDUR) 30 MG 24 hr tablet Take 0.5 tablets (15 mg total) by mouth daily. 30 tablet 3   Lancets (ONETOUCH DELICA PLUS [id number] MISC      levothyroxine (SYNTHROID) 100 MCG tablet Take 100 mcg by mouth daily before breakfast.      loperamide (IMODIUM) 2 MG capsule Take 2-4 mg by mouth 4 (four) times daily as needed for diarrhea  or loose stools.     metoprolol succinate (TOPROL-XL) 50 MG 24 hr tablet TAKE 1 TABLET BY MOUTH ONCE DAILY. 30 tablet 10   Multiple Vitamin (MULTIVITAMIN) tablet Take 1 tablet by mouth daily.     ONETOUCH ULTRA test strip      pantoprazole (PROTONIX) 40 MG tablet Take 40 mg by mouth 2 (two) times daily.      polyethylene glycol (MIRALAX / GLYCOLAX) packet Take 17 g by mouth daily as needed (for constipation.).      potassium chloride (K-DUR) 10 MEQ tablet Take 1 tablet (10 mEq total) by mouth every other day. (Patient taking differently: Take 10 mEq by mouth daily. )     pravastatin (PRAVACHOL) 10 MG tablet Take 10 mg by mouth daily.     PROAIR HFA 108 (  90 Base) MCG/ACT inhaler INHALE 2 PUFFS EVERY 6 HOURS AS NEEDED FOR SHORTNESS OF BREATH AND WHEEZING. (Patient taking differently: Inhale 2 puffs into the lungs every 6 (six) hours as needed for wheezing or shortness of breath. ) 8.5 g 2   Probiotic Product (ALIGN PO) Take 1 tablet by mouth daily.      sitaGLIPtin (JANUVIA) 50 MG tablet Take 50 mg by mouth every morning.      temazepam (RESTORIL) 15 MG capsule Take 15 mg by mouth at bedtime as needed for sleep.      tiZANidine (ZANAFLEX) 2 MG tablet Take 2 mg by mouth every 8 (eight) hours as needed.      traMADol (ULTRAM) 50 MG tablet TAKE 1 TABLET BY MOUTH EVERY SIX HOURS AS NEEDED FOR SEVERE PAIN     XARELTO 15 MG TABS tablet TAKE 1 TABLET BY MOUTH ONCE A DAY. (Patient taking differently: Take 15 mg by mouth at bedtime. ) 30 tablet 11   furosemide (LASIX) 40 MG tablet Take 1 tablet (40 mg total) by mouth daily. 30 tablet 11   No facility-administered medications prior to visit.      Review of Systems:   Constitutional:   No  weight loss, night sweats,  Fevers, chills, + fatigue, or  lassitude.  HEENT:   No headaches,  Difficulty swallowing,  Tooth/dental problems, or  Sore throat,                No sneezing, itching, ear ache, nasal congestion, post nasal drip,   CV:  No  chest pain,  Orthopnea, PND,+  swelling in lower extremities, anasarca, dizziness, palpitations, syncope.   GI  No heartburn, indigestion, abdominal pain, nausea, vomiting, diarrhea, change in bowel habits, loss of appetite, bloody stools.   Resp:   No chest wall deformity  Skin: no rash or lesions.  GU: no dysuria, change in color of urine, no urgency or frequency.  No flank pain, no hematuria   MS:  No joint pain or swelling.  No decreased range of motion.  No back pain.    Physical Exam  BP 126/74 (BP Location: Left Arm, Cuff Size: Normal)    Pulse 71    Temp 98.2 F (36.8 C) (Temporal)    Ht 4' 10.5" (1.486 m)    Wt 148 lb 12.8 oz (67.5 kg)    SpO2 96%    BMI 30.57 kg/m   GEN: A/Ox3; pleasant , NAD, well nourished    HEENT:  Platter/AT,  , NOSE-clear, THROAT-clear, no lesions, no postnasal drip or exudate noted.   NECK:  Supple w/ fair ROM; no JVD; normal carotid impulses w/o bruits; no thyromegaly or nodules palpated; no lymphadenopathy.    RESP decreased breath sounds in the bases  no accessory muscle use, no dullness to percussion  CARD:  RRR, no m/r/g, 2+ peripheral edema, pulses intact, no cyanosis or clubbing.  GI:   Soft & nt; nml bowel sounds; no organomegaly or masses detected.   Musco: Warm bil, no deformities or joint swelling noted.   Neuro: alert, no focal deficits noted.    Skin: Warm, no lesions or rashes    Lab Results:  CBC    Component Value Date/Time   WBC 7.0 02/19/2019 1418   RBC 3.82 (L) 02/19/2019 1418   HGB 10.6 (L) 02/19/2019 1418   HCT 37.2 02/19/2019 1418   PLT 315 02/19/2019 1418   MCV 97.4 02/19/2019 1418   MCH 27.7 02/19/2019 1418  MCHC 28.5 (L) 02/19/2019 1418   RDW 22.3 (H) 02/19/2019 1418   LYMPHSABS 1.3 02/19/2019 1418   MONOABS 0.9 02/19/2019 1418   EOSABS 0.3 02/19/2019 1418   BASOSABS 0.1 02/19/2019 1418    BMET    Component Value Date/Time   NA 139 05/03/2018 1550   NA 141 07/29/2010   K 4.4 05/03/2018 1550   K  3.6 07/29/2010   CL 107 05/03/2018 1550   CL 103 07/29/2010   CO2 26 05/03/2018 1550   CO2 25 07/29/2010   GLUCOSE 74 05/03/2018 1550   BUN 17 05/03/2018 1550   BUN 16 07/29/2010   CREATININE 1.51 (H) 05/03/2018 1550   CREATININE 1.64 (H) 06/22/2017 1415   CALCIUM 9.2 05/03/2018 1550   CALCIUM 9.4 07/29/2010   GFRNONAA 36 (L) 05/03/2018 1550   GFRNONAA 32 (L) 08/26/2013 0816   GFRAA 42 (L) 05/03/2018 1550   GFRAA 36 (L) 08/26/2013 0816    BNP    Component Value Date/Time   BNP 894.0 (H) 05/03/2018 1550    ProBNP    Component Value Date/Time   PROBNP 376.0 (H) 12/04/2016 1111    Imaging: Dg Chest 2 View  Result Date: 03/11/2019 CLINICAL DATA:  63 year old female with history of dyspnea. EXAM: CHEST - 2 VIEW COMPARISON:  Chest x-ray 07/18/2018. FINDINGS: Moderate right pleural effusion, similar to the prior study. Areas of chronic scarring and/or atelectasis in the right lung base. Left lung is clear. No left pleural effusion. No evidence of pulmonary edema. Mild cardiomegaly. Upper mediastinal contours are within normal limits. Aortic atherosclerosis. Left-sided biventricular pacemaker device with lead tips projecting over the expected location of the right ventricular apex and overlying the anterior wall the left ventricle via the coronary sinus and coronary veins. Surgical clips projecting over the thoracic inlet, presumably from prior thyroidectomy. IMPRESSION: 1. Stable chronic moderate right pleural effusion with areas of scarring and/or chronic atelectasis in the right lung base. 2. Mild cardiomegaly. 3. Aortic atherosclerosis. 4. Postoperative changes and support apparatus, as above. Electronically Signed   By: Vinnie Langton M.D.   On: 03/11/2019 16:00    ferumoxytol (FERAHEME) 510 mg in sodium chloride 0.9 % 100 mL IVPB    Date Action Dose Route User   Discharged on 02/24/2019   Admitted on 02/24/2019   02/03/2019 1327 Rate/Dose Verify (none) (none) Philippa Sicks,  RN   02/03/2019 1327 Rate/Dose Change (none) (none) Philippa Sicks, RN   02/03/2019 1327 New Bag/Given 510 mg Intravenous Anselmo Rod, RN    ferumoxytol [facility]) 510 mg in sodium chloride 0.9 % 100 mL IVPB    Date Action Dose Route User   Discharged on 02/24/2019   Admitted on 02/24/2019   02/10/2019 0915 Rate/Dose Verify (none) (none) Page, Coralie Keens, RN   02/10/2019 0915 Rate/Dose Verify (none) (none) Page, Coralie Keens, RN   02/10/2019 0914 New Bag/Given 510 mg Intravenous Page, Coralie Keens, RN    0.9 %  sodium chloride infusion    Date Action Dose Route User   Discharged on 02/24/2019   Admitted on 02/24/2019   02/03/2019 1423 Rate/Dose Change (none) (none) Philippa Sicks, RN   02/03/2019 1422 Rate/Dose Change (none) (none) Philippa Sicks, RN   02/03/2019 1352 Rate/Dose Change (none) (none) Philippa Sicks, RN   02/03/2019 1351 Restarted (none) (none) Presnell, Zeb Comfort, RN   02/03/2019 1313 Rate/Dose Verify (none) (none) Presnell, Brandy D, RN    0.9 %  sodium chloride infusion  Date Action Dose Route User   Discharged on 02/24/2019   Admitted on 02/24/2019   02/10/2019 0933 Rate/Dose Verify (none) (none) Page, Coralie Keens, RN   02/10/2019 0932 Restarted (none) (none) Page, Coralie Keens, RN   02/10/2019 0900 Rate/Dose Verify (none) (none) Page, Coralie Keens, RN   02/10/2019 [phone] New Bag/Given (none) Intravenous Page, Coralie Keens, RN      PFT Results Latest Ref Rng & Units 07/18/2018 05/24/2017 12/04/2016  FVC-Pre L 1.42 1.40 1.72  FVC-Predicted Pre % 55 54 66  FVC-Post L - 1.56 1.67  FVC-Predicted Post % - 60 64  Pre FEV1/FVC % % 80 81 86  Post FEV1/FCV % % - 85 80  FEV1-Pre L 1.14 1.13 1.47  FEV1-Predicted Pre % 58 56 73  FEV1-Post L - 1.33 1.34  DLCO UNC% % 48 56 61  DLCO COR %Predicted % 85 86 87  TLC L - 3.32 3.78  TLC % Predicted % - 80 91  RV % Predicted % - 82 103    No results found for: NITRICOXIDE      Assessment & Plan:   Acute on chronic  combined systolic and diastolic CHF (congestive heart failure) (HCC) Patient appears to be mildly volume overloaded with lower extremity edema We will increase Lasix for the next 2 days.  Check labs with be met and BNP.  Check chest x-ray.  Refer to cardiology/CHF clinic  Plan  Patient Instructions  Continue on BREO 1 puff daily , rinse after use.  Extra Lasix 44m daily for 2 days then 443mdaily .  Refer to cardiology -CHF  Labs and chest xray today  Follow up with Dr. AlElsworth SohoIn 6 months and As needed   Please contact office for sooner follow up if symptoms do not improve or worsen or seek emergency care       Asthma Reactive airways versus mild asthma.  Unclear if patient has underlying asthma.  She seems to be improved with her asthma as far as cough and wheezing.  She is on Breo which she can continue.  Suspect most of her shortness of breath is related to her congestive heart failure.  Plan  Patient Instructions  Continue on BREO 1 puff daily , rinse after use.  Extra Lasix 4082maily for 2 days then 34m49mily .  Refer to cardiology -CHF  Labs and chest xray today  Follow up with Dr. AlvaElsworth Soho 6 months and As needed   Please contact office for sooner follow up if symptoms do not improve or worsen or seek emergency care          TammRexene Wright 03/11/2019

## 2019-03-11 NOTE — Assessment & Plan Note (Signed)
Reactive airways versus mild asthma.  Unclear if patient has underlying asthma.  She seems to be improved with her asthma as far as cough and wheezing.  She is on Breo which she can continue.  Suspect most of her shortness of breath is related to her congestive heart failure.  Plan  Patient Instructions  Continue on BREO 1 puff daily , rinse after use.  Extra Lasix 40mg  daily for 2 days then 40mg  daily .  Refer to cardiology -CHF  Labs and chest xray today  Follow up with Dr. Elsworth Soho  In 6 months and As needed   Please contact office for sooner follow up if symptoms do not improve or worsen or seek emergency care

## 2019-03-11 NOTE — Patient Instructions (Signed)
Continue on BREO 1 puff daily , rinse after use.  Extra Lasix 40mg  daily for 2 days then 40mg  daily .  Refer to cardiology -CHF  Labs and chest xray today  Follow up with Dr. Elsworth Soho  In 6 months and As needed   Please contact office for sooner follow up if symptoms do not improve or worsen or seek emergency care

## 2019-03-11 NOTE — Assessment & Plan Note (Signed)
Patient appears to be mildly volume overloaded with lower extremity edema We will increase Lasix for the next 2 days.  Check labs with be met and BNP.  Check chest x-ray.  Refer to cardiology/CHF clinic  Plan  Patient Instructions  Continue on BREO 1 puff daily , rinse after use.  Extra Lasix '40mg'$  daily for 2 days then '40mg'$  daily .  Refer to cardiology -CHF  Labs and chest xray today  Follow up with Dr. Elsworth Soho  In 6 months and As needed   Please contact office for sooner follow up if symptoms do not improve or worsen or seek emergency care

## 2019-03-12 ENCOUNTER — Other Ambulatory Visit: Payer: Self-pay

## 2019-03-12 ENCOUNTER — Encounter (HOSPITAL_COMMUNITY): Payer: Self-pay

## 2019-03-12 ENCOUNTER — Ambulatory Visit (HOSPITAL_COMMUNITY): Payer: PPO

## 2019-03-12 DIAGNOSIS — R2689 Other abnormalities of gait and mobility: Secondary | ICD-10-CM

## 2019-03-12 DIAGNOSIS — R293 Abnormal posture: Secondary | ICD-10-CM

## 2019-03-12 DIAGNOSIS — M545 Low back pain, unspecified: Secondary | ICD-10-CM

## 2019-03-12 DIAGNOSIS — M6281 Muscle weakness (generalized): Secondary | ICD-10-CM

## 2019-03-12 LAB — BASIC METABOLIC PANEL
BUN: 12 mg/dL (ref 6–23)
CO2: 24 mEq/L (ref 19–32)
Calcium: 9.5 mg/dL (ref 8.4–10.5)
Chloride: 109 mEq/L (ref 96–112)
Creatinine, Ser: 1.13 mg/dL (ref 0.40–1.20)
GFR: 48.65 mL/min — ABNORMAL LOW (ref >60.00)
Glucose, Bld: 60 mg/dL — ABNORMAL LOW (ref 70–99)
Potassium: 3.4 mEq/L — ABNORMAL LOW (ref 3.5–5.1)
Sodium: 142 mEq/L (ref 135–145)

## 2019-03-12 LAB — BRAIN NATRIURETIC PEPTIDE: Pro B Natriuretic peptide (BNP): 1752 pg/mL — ABNORMAL HIGH (ref 0.0–100.0)

## 2019-03-12 NOTE — Therapy (Addendum)
[facility] [address] [place], [place], [address] Phone: [phone]   Fax:  [phone]   PHYSICAL THERAPY DISCHARGE SUMMARY  Visits from Start of Care: 8  Current functional level related to goals / functional outcomes: See below   Remaining deficits: See below   Education / Equipment: See below  Plan: Patient agrees to discharge.  Patient goals were partially met. Patient is being discharged due to meeting the stated rehab goals.  ?????       Physical Therapy Treatment  Patient Details  Name: [name] MRN: [id number] Date of Birth: 1956/03/03 Referring Provider (PT): Allyn Kenner, MD   Encounter Date: 03/12/2019  PT End of Session - 03/12/19 1036    Visit Number  8    Number of Visits  8    Date for PT Re-Evaluation  03/13/19   reassessment completed 9/3, visit #4   Authorization Type  [employer]    Authorization Time Period  02/13/19 to 02/28/19; NEW: 02/28/19 to 03/13/19    Authorization - Visit Number  5    Authorization - Number of Visits  10    PT Start Time  1030    PT Stop Time  1100    PT Time Calculation (min)  30 min    Activity Tolerance  Patient tolerated treatment well    Behavior During Therapy  [facility] for tasks assessed/performed       Past Medical History:  Diagnosis Date  . Amiodarone toxicity   . Anemia   . Chronic bronchitis   . Chronic systolic CHF (congestive heart failure) ([place])    a. 04/2017 Echo: EF 25%.  . Congenital heart disease   . Diabetes mellitus without complication ([place])    Type II  . Diverticulosis   . Fall due to stumbling October 08, 2013   Due to shoes  . GERD (gastroesophageal reflux disease)   . Gout   . Hemorrhoids   . Hyperlipidemia   . Hyperparathyroidism   . Hypertension   . Hypothyroidism   . IDA (iron deficiency anemia)   . Microscopic colitis 9/11   Colonoscopy  . NICM (nonischemic cardiomyopathy) - tachycardia induced in the setting of  AFib ([place])    a. 04/2017 Echo: EF 25%, basal-mid inferolateral, inf AK, triv AI, mild MR, sev dil LA, mod red RV fxn, mildly dil RA, mild to mod TR, PASP 40mHg.  . Non-obstructive CAD (coronary artery disease)    a. 04/2017 Cath: LM 20p, LCX nl, RCA 20p/m.  .Marland KitchenParoxysmal SVT (supraventricular tachycardia) ([place]   . Persistent atrial fibrillation 06/2013   a. 04/2017 Admit with CHF/NICM/AFib -> CHA2DS2VASc = 4-->Xarelto 15 mg daily started; b. 06/2017 s/p BiV PPM (SJM ser # [id number] and AVN ablation.  . Presence of permanent cardiac pacemaker 06/29/2017   Biventricular pacemaker placed  . PVD (peripheral vascular disease) [place] 09/2004   Right common femoral endarterectomy in April of 2007  . Renal insufficiency   . Schatzki's ring    Last EGD with esophageal dilatation 60F  9/11  . Scoliosis   . Varicose veins right leg pain and swelling    Past Surgical History:  Procedure Laterality Date  . [facility]  [facility] 63  [facility] N/A 06/29/2017   Procedure: AV NODE ABLATION;  Surgeon: TEvans Lance MD;  [facility];  Service: Cardiovascular;  Laterality: N/A;  . BIOPSY  02/13/2017   Procedure: BIOPSY;  Surgeon: Daneil Dolin, MD;  [facility];  Service: Endoscopy;;  duodenum gastric  . BIV PACEMAKER INSERTION CRT-P N/A 06/29/2017   Procedure: BIV PACEMAKER INSERTION CRT-P;  Surgeon: Evans Lance, MD;  Location: [place] [facility];  Service: Cardiovascular;  Laterality: N/A;  . CARDIOVERSION N/A 11/13/2013   Procedure: CARDIOVERSION;  Surgeon: Herminio Commons, MD;  Location: [facility];  Service: Endoscopy;  Laterality: N/A;  . COLONOSCOPY  03/04/2010   anal canal hemorrhoids otherwise normal. TI normal. Bx showed lymphocytic colitis. next TCS 02/2020  . COLONOSCOPY  April 2008   Rehman: Pancolonic diverticulosis, external hemorrhoids  . COLONOSCOPY N/A 11/30/2015   RMR: Normal terminal ileum for 10 cm,  nonbleeding grade 1 internal hemorrhoids, colonic diverticulosis. Next colonoscopy in 2027.  Marland Kitchen COLONOSCOPY WITH PROPOFOL N/A 02/24/2019   Procedure: COLONOSCOPY WITH PROPOFOL;  Surgeon: Daneil Dolin, MD;  [facility];  Service: Endoscopy;  Laterality: N/A;  11:15am  . ELAS  06-01-11   Right saphenous ELAS   . ESOPHAGEAL DILATION N/A 05/28/2015   Procedure: ESOPHAGEAL DILATION;  Surgeon: Daneil Dolin, MD;  [facility];  Service: Endoscopy;  Laterality: N/A;  . ESOPHAGOGASTRODUODENOSCOPY  03/04/2010   noncritical appearing Schatzki ring. SB bx negative  . ESOPHAGOGASTRODUODENOSCOPY  05/2008   erosive reflux esophagatitis, noncritical Schatzki ring  . ESOPHAGOGASTRODUODENOSCOPY N/A 05/28/2015   SKA:[id number] esophagus somewhat baggy, likely due to underlying esophageal motility disorder/small HH   . ESOPHAGOGASTRODUODENOSCOPY N/A 02/13/2017   Dr. Gala Romney: subtly abnormal stomach of doubtful clinical significant s/p biopsy, normal duodenum s/p biopsy, fundic gland polyp on path, normal duodenal biopsy  . ESOPHAGOGASTRODUODENOSCOPY (EGD) WITH PROPOFOL N/A 02/24/2019   Procedure: ESOPHAGOGASTRODUODENOSCOPY (EGD) WITH PROPOFOL;  Surgeon: Daneil Dolin, MD;  [facility];  Service: Endoscopy;  Laterality: N/A;  . [place] SURGERY  2010  . Left hemithyroidectomy    . Left parathyroidectomy  2007  . MALONEY DILATION N/A 02/24/2019   Procedure: Venia Minks DILATION;  Surgeon: Daneil Dolin, MD;  [facility];  Service: Endoscopy;  Laterality: N/A;  . Parathyroid adenoma  2007  . Right common femoral endarterectomy  2007  . RIGHT/LEFT HEART CATH AND CORONARY ANGIOGRAPHY N/A 05/15/2017   Procedure: RIGHT/LEFT HEART CATH AND CORONARY ANGIOGRAPHY;  Surgeon: Troy Sine, MD;  Location: [place] [facility] LAB;  Service: Cardiovascular;  Laterality: N/A;  . Small bowel capsule  12 2009   Mid to distal small bowel with edema, erosions, tiny ulceration felt to be NSAID  related  . TONSILLECTOMY      There were no vitals filed for this visit.  Subjective Assessment - 03/12/19 1035    Subjective  Pt reports she is going to miss [place] once discharging from therapy. Pt reports she feels like she has gotten a lot better since starting therapy and no issues or pain with activities at home. Pt reports went to PCP yesterday and lungs sound bad and swelling in BLE, chest x-ray shows normal chornic issue and no new issues.    Pertinent History  scoliosis, pacemaker    Limitations  Sitting;House hold activities    How long can you sit comfortably?  n oissues    How long can you stand comfortably?  no issues    How long can you walk comfortably?  no issues    Diagnostic tests  none    Patient Stated Goals  move around and do what  I can normally do without any pain    Currently in Pain?  No/denies         [facility] PT Assessment - 03/12/19 0001      Assessment   Medical Diagnosis  Back pain    Referring Provider (PT)  Allyn Kenner, MD    Onset Date/Surgical Date  --   approximately 2-3 weeks   Next MD Visit  no f/u scheduled    Prior Therapy  None      Precautions   Precautions  None      Restrictions   Weight Bearing Restrictions  No      Balance Screen   Has the patient fallen in the past 6 months  No    Has the patient had a decrease in activity level because of a fear of falling?   No    Is the patient reluctant to leave their home because of a fear of falling?   No      AROM   Overall AROM Comments  denies pain with all mobility    AROM Assessment Site  Lumbar    Lumbar Flexion  WNL   was WNL   Lumbar Extension  WNL   was 15% limited, no pain   Lumbar - Right Side Bend  WNL   was 25% limited   Lumbar - Left Side Bend  WNL   was 25% limited, no pain   Lumbar - Right Rotation  25% limited, no pain   was 25% limited, no pain   Lumbar - Left Rotation  25% limited, no pain   was 25% limited, no pain     Strength   Right Hip Flexion  3+/5   was 3+    Right Hip Extension  3+/5   was 3   Right Hip ABduction  4-/5   was 4-   Left Hip Flexion  3+/5   was 3+   Left Hip Extension  3+/5   was 3   Left Hip ABduction  4-/5   was 3+   Right Knee Flexion  4/5   was 4   Right Knee Extension  4+/5   was 4+   Left Knee Flexion  4/5   was 4   Left Knee Extension  4+/5   was 4+   Right Ankle Dorsiflexion  4/5   was 4   Left Ankle Dorsiflexion  4/5   was 4     Balance   Balance Assessed  Yes      Static Standing Balance   Static Standing - Balance Support  No upper extremity supported    Static Standing Balance -  Activities   Single Leg Stance - Right Leg;Single Leg Stance - Left Leg;Tandam Stance - Right Leg;Tandam Stance - Left Leg    Static Standing - Comment/# of Minutes  SLS: 3 sec or <; Tandem RLE back: 15 sec, Tandem LLE back: 5 sec   was SLS: <1 sec bil; tandem stance: <1 sec bil          PT Education - 03/12/19 1036    Education Details  reassessment findings, d/c to HEP    Person(s) Educated  Patient    Methods  Explanation    Comprehension  Verbalized understanding       PT Short Term Goals - 03/12/19 1037      PT SHORT TERM GOAL #1   Title  Pt will be independent with HEP, perform consistently with correct form  and update PRN.    Baseline  02/27/19:  reports compliance 3-4 days a week; 9/16: pt reports 2x/week    Status  Achieved        PT Long Term Goals - 03/12/19 1038      PT LONG TERM GOAL #1   Title  Pt will perform SLS and tandem stance for 15 sec or > bilaterally to reduce risk for falls.    Baseline  02/27/19:  Only able to hold  SLS 1" max Bil.  Requires HHA wiht tandem stance 8" top with Lt LE behind, 4" with Rt behind; 9/16: SLS: 3 sec or <; Tandem RLE back: 15 sec, Tandem LLE back: 5 sec    Status  Partially Met      PT LONG TERM GOAL #2   Title  Pt will perform 5x STS in 11.4 seec or < to demonstrate reduced pain with functional activities and return to PLOF.    Baseline  02/27/19: 5STS 9.84"  no HHA    Status  Achieved      PT LONG TERM GOAL #3   Title  Pt will complete 3MWT with gait speed of 0.44ms or > to demo improved gait mechanics, functional strength, and reduce risk for falls.    Baseline  02/27/19: .811m    Status  Achieved      PT LONG TERM GOAL #4   Title  Pt will improve overall BLE strength by 1 MMT grade to improve gait mechanics and ability to perform household chores.    Baseline  02/27/19: see MMT; 9/16: see MMT    Status  Partially Met      PT LONG TERM GOAL #5   Title  Pt will self report 0/10 pain at worst with funcitonal activities around the house to improve QoL and return to PLOF.    Baseline  9/16: 0/10 pain with activities at home    Status  Achieved            Plan - 03/12/19 1037    Clinical Impression Statement  Pt due for reassessment of goals and other objective measures this date. Pt has made some improvements in BLE strength and has maintained initial strength from evaluation. Pt continues to demonstrate deficits in standing balance per SLS and tandem stance, but with minimal improvements. Pt has met all but 2 goals, with progress towards balance and strength goals. Pt is independent and confident with HEP and able to perform exercises to further improve strength and balance at home. Subjectively, pt without difficulty performing any activities at home and no pain with any activities at home. Pt educated to retrieve new referral if needs arise in the future.    Personal Factors and Comorbidities  Comorbidity 3+;Time since onset of injury/illness/exacerbation    Comorbidities  see above    Examination-Activity Limitations  Reach Overhead;Sit    Examination-Participation Restrictions  Cleaning;Driving    Stability/Clinical Decision Making  Stable/Uncomplicated    Rehab Potential  Good    PT Frequency  2x / week    PT Duration  2 weeks    PT Treatment/Interventions  ADLs/Self Care Home Management;Aquatic Therapy;Biofeedback;Cryotherapy;Moist  Heat;DME Instruction;Gait training;Stair training;Functional mobility training;Therapeutic activities;Therapeutic exercise;Balance training;Neuromuscular re-education;Patient/family education;Orthotic Fit/Training;Manual techniques;Passive range of motion;Dry needling;Taping;Joint Manipulations    PT Next Visit Plan  Discharge to HEP    PT Home Exercise Plan  02/19/19: LTR and bridge; 02/27/19: bridge, STS, SLS and standing abduciton insturcted to complete infront of sink/counter; 9/9: HS stretch  on step with UE support; 9/11: rows and shoulder extension GTB; 9/14: squats with chair tap for cues, tandem stance with counter support    Consulted and Agree with Plan of Care  Patient       Patient will benefit from skilled therapeutic intervention in order to improve the following deficits and impairments:  Abnormal gait, Cardiopulmonary status limiting activity, Decreased activity tolerance, Decreased balance, Decreased endurance, Decreased range of motion, Decreased strength, Hypomobility, Increased fascial restricitons, Increased muscle spasms, Impaired perceived functional ability, Impaired flexibility, Improper body mechanics, Postural dysfunction, Pain  Visit Diagnosis: Acute bilateral low back pain without sciatica  Abnormal posture  Other abnormalities of gait and mobility  Muscle weakness (generalized)     Problem List Patient Active Problem List   Diagnosis Date Noted  . Asthma 03/11/2019  . Atrial fibrillation with rapid ventricular response ([place]) 06/29/2017  . Acute bronchitis 05/24/2017  . CAD (coronary artery disease) 05/17/2017  . Elevated troponin   . Acute systolic heart failure ([place])   . Non-ST elevation (NSTEMI) myocardial infarction ([place])   . Non-ischemic cardiomyopathy ([place])   . Acute on chronic combined systolic and diastolic CHF (congestive heart failure) ([place]) 05/10/2017  . Persistent atrial fibrillation 05/09/2017  . LLQ pain 01/04/2017  . ILD (interstitial lung  disease) ([place]) 11/21/2016  . Hemorrhoids 08/21/2016  . IDA (iron deficiency anemia) 01/11/2016  . Diverticulosis of colon without hemorrhage   . Abdominal pain, right upper quadrant 10/29/2015  . Rectal bleeding 10/29/2015  . Normocytic anemia 10/29/2015  . Dysphagia   . Abnormality of esophagus 04/29/2015  . Esophageal dysphagia 03/26/2015  . OSA (obstructive sleep apnea) 12/31/2013  . Atrial fibrillation with RVR ([place]) 11/09/2013  . CKD (chronic kidney disease), stage III ([place]) 11/09/2013  . Hyperkalemia 11/07/2013  . Diastolic dysfunction 76/28/3151  . DM (diabetes mellitus) ([place]) 11/07/2013  . Varicose veins of lower extremities with ulcer ([name]) 10/17/2013  . Cold-Left Hand / Foot 10/08/2013  . Circulation problem-Left foot 10/08/2013  . Atherosclerosis of native arteries of the extremities with ulceration(440.23) 10/08/2013  . A-fib ([place]) 08/05/2013  . Constipation 04/11/2013  . Peripheral vascular disease, unspecified ([place]) 10/27/2011  . Varicose veins of bilateral lower extremities with other complications 76/16/0737  . COLLAGENOUS COLITIS 04/08/2010  . GERD 02/18/2010  . HYPERTHYROIDISM 02/11/2010     Talbot Grumbling PT, DPT 03/12/19, 11:04 AM [facility] [address] [place], [place], [address] Phone: [phone]   Fax:  [phone]  Name: [name] MRN: [id number] Date of Birth: January 31, 1956

## 2019-03-13 ENCOUNTER — Other Ambulatory Visit: Payer: Self-pay | Admitting: *Deleted

## 2019-03-13 DIAGNOSIS — R899 Unspecified abnormal finding in specimens from other organs, systems and tissues: Secondary | ICD-10-CM

## 2019-03-13 DIAGNOSIS — I5043 Acute on chronic combined systolic (congestive) and diastolic (congestive) heart failure: Secondary | ICD-10-CM

## 2019-03-13 NOTE — Progress Notes (Signed)
e

## 2019-03-17 ENCOUNTER — Ambulatory Visit: Payer: PPO | Admitting: Gastroenterology

## 2019-03-20 ENCOUNTER — Ambulatory Visit (INDEPENDENT_AMBULATORY_CARE_PROVIDER_SITE_OTHER): Payer: PPO | Admitting: Adult Health

## 2019-03-20 ENCOUNTER — Other Ambulatory Visit: Payer: Self-pay

## 2019-03-20 ENCOUNTER — Encounter: Payer: Self-pay | Admitting: Adult Health

## 2019-03-20 VITALS — BP 122/68 | HR 69 | Temp 98.1°F | Ht 58.5 in | Wt 140.4 lb

## 2019-03-20 DIAGNOSIS — J4520 Mild intermittent asthma, uncomplicated: Secondary | ICD-10-CM

## 2019-03-20 DIAGNOSIS — I5021 Acute systolic (congestive) heart failure: Secondary | ICD-10-CM

## 2019-03-20 DIAGNOSIS — R0600 Dyspnea, unspecified: Secondary | ICD-10-CM

## 2019-03-20 DIAGNOSIS — N183 Chronic kidney disease, stage 3 unspecified: Secondary | ICD-10-CM

## 2019-03-20 DIAGNOSIS — I5043 Acute on chronic combined systolic (congestive) and diastolic (congestive) heart failure: Secondary | ICD-10-CM

## 2019-03-20 DIAGNOSIS — R899 Unspecified abnormal finding in specimens from other organs, systems and tissues: Secondary | ICD-10-CM | POA: Diagnosis not present

## 2019-03-20 LAB — BASIC METABOLIC PANEL
BUN: 20 mg/dL (ref 6–23)
CO2: 27 mEq/L (ref 19–32)
Calcium: 10 mg/dL (ref 8.4–10.5)
Chloride: 106 mEq/L (ref 96–112)
Creatinine, Ser: 1.24 mg/dL — ABNORMAL HIGH (ref 0.40–1.20)
GFR: 43.7 mL/min — ABNORMAL LOW (ref >60.00)
Glucose, Bld: 89 mg/dL (ref 70–99)
Potassium: 3.7 mEq/L (ref 3.5–5.1)
Sodium: 142 mEq/L (ref 135–145)

## 2019-03-20 LAB — BRAIN NATRIURETIC PEPTIDE: Pro B Natriuretic peptide (BNP): 603 pg/mL — ABNORMAL HIGH (ref 0.0–100.0)

## 2019-03-20 NOTE — Assessment & Plan Note (Signed)
Controlled on Breo  Plan  Patient Instructions  Continue on BREO 1 puff daily , rinse after use.  Continue on Lasix 40 mg daily.  May take extra Lasix 40 mg daily if needed for weight gain or increased leg swelling Follow-up for 2D echo as planned Follow-up with cardiology as discussed Labs today. Follow up with Dr. Elsworth Soho  In 3  months and As needed   Please contact office for sooner follow up if symptoms do not improve or worsen or seek emergency care

## 2019-03-20 NOTE — Assessment & Plan Note (Signed)
Decompensated w/  improvement with diuresis Labs showed elevated BNP.  Patient was treated with extra 40 mg of Lasix for a total of 80 mg .  Weight is down 8 pounds.  She has perceived clinical benefit.  Leg swelling is decreased. 2D echo is pending for next week.  She has a follow-up cardiology appointment in 2 weeks. We will return back to Lasix 40 mg daily.  She has option to take an extra Lasix if needed.  Continue low-salt diet.  Keep legs elevated.  Continue on her current regimen.  Plan  Patient Instructions  Continue on BREO 1 puff daily , rinse after use.  Continue on Lasix 40 mg daily.  May take extra Lasix 40 mg daily if needed for weight gain or increased leg swelling Follow-up for 2D echo as planned Follow-up with cardiology as discussed Labs today. Follow up with Dr. Elsworth Soho  In 3  months and As needed   Please contact office for sooner follow up if symptoms do not improve or worsen or seek emergency care

## 2019-03-20 NOTE — Progress Notes (Signed)
@Patient  ID: [name], female    DOB: Jan 15, 1956, 63 y.o.   MRN: [id number]  Chief Complaint  Patient presents with  . Follow-up    Dyspnea    Referring provider: Celene Squibb, MD  HPI: 63 year old female never smoker followed for asthma. Seen for pulmonary consult Nov 21, 2016 for cough and shortness of breath felt related to her congestive heart failure Medical history significant for chronic A. fib status post ablation and PPM, CHF, tachycardia mediated cardiomyopathy Mild obstructive sleep apnea with CPAP intolerance  TEST/EVENTS :  Echo June 2018, EF [id number] diastolic dysfunction,severely dilated left atrium moderately dilated. Right atrium, pulmonary pressure 52 mmHg.  Marland Kitchen11/2018Echo showed EF at 25%. Cath showed nonobstructive CAD . RHC showed mild elevated PAP    PFT 11/2016 FVC 64%, DLCO 61% PFT11/2018shows stable lung function . FEV1 67% , ratio 85, FVC 60%. + BD response, DLCO 56%.  PFT 06/2018 FVC 55%, ratio 80, DLCO 48%  05/2017 HRCT chest shows small right effusion and marked improvement in groundglass opacities bilaterally, no ILD  03/20/2019 Follow up : Asthma , Dyspnea  Patient returns for a 2-week follow-up.  Patient was seen last visit with worsening shortness of breath, weight gain and lower extremity swelling.  She was felt to have decompensated congestive heart failure.  She was recommended to increase Lasix to extra 40 mg daily.  Labs showed a elevated BNP at 1752 kidney function was stable and potassium was slightly low at 3.4.  Potassium was increased accordingly she was referred back to cardiology and a 2D echo is pending. Since last visit she is feeling better with less dyspnea and decreased leg swelling .  Weight is down 8 pounds at 140.  Patient has asthma and is on Breo.  Says she has no increased cough or wheezing.  Allergies  Allergen Reactions  . Flecainide Nausea Only and Other (See Comments)    Faint feeling  .  Hydrocodone-Acetaminophen Nausea Only and Other (See Comments)    Severe headache  . Ibuprofen Other (See Comments)    Kidney dysfunction  . Oxycodone Hcl Nausea Only and Other (See Comments)    Headache  . Penicillins Nausea Only and Other (See Comments)    Severe headache Has patient had a PCN reaction causing immediate rash, facial/tongue/throat swelling, SOB or lightheadedness with hypotension: No Has patient had a PCN reaction causing severe rash involving mucus membranes or skin necrosis: No Has patient had a PCN reaction that required hospitalization: No Has patient had a PCN reaction occurring within the last 10 years: No If all of the above answers are "NO", then may proceed with Cephalosporin use.     Immunization History  Administered Date(s) Administered  . Influenza Split 03/25/2016, 03/25/2017, 03/06/2018  . Influenza-Unspecified 03/07/2014  . Pneumococcal Conjugate-13 04/26/2015  . Zoster 05/25/2016    Past Medical History:  Diagnosis Date  . Amiodarone toxicity   . Anemia   . Chronic bronchitis   . Chronic systolic CHF (congestive heart failure) ([place])    a. 04/2017 Echo: EF 25%.  . Congenital heart disease   . Diabetes mellitus without complication ([place])    Type II  . Diverticulosis   . Fall due to stumbling October 08, 2013   Due to shoes  . GERD (gastroesophageal reflux disease)   . Gout   . Hemorrhoids   . Hyperlipidemia   . Hyperparathyroidism   . Hypertension   . Hypothyroidism   . IDA (iron deficiency anemia)   .  Microscopic colitis 9/11   Colonoscopy  . NICM (nonischemic cardiomyopathy) - tachycardia induced in the setting of AFib ([place])    a. 04/2017 Echo: EF 25%, basal-mid inferolateral, inf AK, triv AI, mild MR, sev dil LA, mod red RV fxn, mildly dil RA, mild to mod TR, PASP 67mmHg.  . Non-obstructive CAD (coronary artery disease)    a. 04/2017 Cath: LM 20p, LCX nl, RCA 20p/m.  Marland Kitchen Paroxysmal SVT (supraventricular tachycardia) ([place])   .  Persistent atrial fibrillation 06/2013   a. 04/2017 Admit with CHF/NICM/AFib -> CHA2DS2VASc = 4-->Xarelto 15 mg daily started; b. 06/2017 s/p BiV PPM (SJM ser # [id number]) and AVN ablation.  . Presence of permanent cardiac pacemaker 06/29/2017   Biventricular pacemaker placed  . PVD (peripheral vascular disease) ([place]) 09/2004   Right common femoral endarterectomy in April of 2007  . Renal insufficiency   . Schatzki's ring    Last EGD with esophageal dilatation 49F  9/11  . Scoliosis   . Varicose veins right leg pain and swelling    Tobacco History: Social History   Tobacco Use  Smoking Status Never Smoker  Smokeless Tobacco Never Used  Tobacco Comment   Never smoked   Counseling given: Not Answered Comment: Never smoked   Outpatient Medications Prior to Visit  Medication Sig Dispense Refill  . acetaminophen (TYLENOL) 500 MG tablet Take 1,000 mg by mouth every 6 (six) hours as needed (for pain.).     Marland Kitchen BREO ELLIPTA 100-25 MCG/INH AEPB Inhale 2 puffs into the lungs daily.    . cyclobenzaprine (FLEXERIL) 5 MG tablet Take 5 mg by mouth 3 (three) times daily as needed for muscle spasms.     . ferrous sulfate (FEOSOL) 325 (65 FE) MG tablet Take 325 mg by mouth daily with breakfast.     . furosemide (LASIX) 40 MG tablet Take 1 tablet (40 mg total) by mouth daily. 30 tablet 0  . gabapentin (NEURONTIN) 100 MG capsule Take 100 mg by mouth 2 (two) times daily.     . hydrALAZINE (APRESOLINE) 25 MG tablet Take 0.5 tablets (12.5 mg total) by mouth every 8 (eight) hours. 45 tablet 6  . isosorbide mononitrate (IMDUR) 30 MG 24 hr tablet Take 0.5 tablets (15 mg total) by mouth daily. 30 tablet 3  . Lancets (ONETOUCH DELICA PLUS [id number]) MISC     . levothyroxine (SYNTHROID) 100 MCG tablet Take 100 mcg by mouth daily before breakfast.     . loperamide (IMODIUM) 2 MG capsule Take 2-4 mg by mouth 4 (four) times daily as needed for diarrhea or loose stools.    . metoprolol succinate (TOPROL-XL) 50 MG  24 hr tablet TAKE 1 TABLET BY MOUTH ONCE DAILY. 30 tablet 10  . Multiple Vitamin (MULTIVITAMIN) tablet Take 1 tablet by mouth daily.    Glory Rosebush ULTRA test strip     . pantoprazole (PROTONIX) 40 MG tablet Take 40 mg by mouth 2 (two) times daily.     . polyethylene glycol (MIRALAX / GLYCOLAX) packet Take 17 g by mouth daily as needed (for constipation.).     Marland Kitchen potassium chloride (K-DUR) 10 MEQ tablet Take 1 tablet (10 mEq total) by mouth every other day. (Patient taking differently: Take 10 mEq by mouth daily. )    . pravastatin (PRAVACHOL) 10 MG tablet Take 10 mg by mouth daily.    Marland Kitchen PROAIR HFA 108 (90 Base) MCG/ACT inhaler INHALE 2 PUFFS EVERY 6 HOURS AS NEEDED FOR SHORTNESS OF BREATH AND WHEEZING. (Patient  taking differently: Inhale 2 puffs into the lungs every 6 (six) hours as needed for wheezing or shortness of breath. ) 8.5 g 2  . Probiotic Product (ALIGN PO) Take 1 tablet by mouth daily.     . sitaGLIPtin (JANUVIA) 50 MG tablet Take 50 mg by mouth every morning.     . temazepam (RESTORIL) 15 MG capsule Take 15 mg by mouth at bedtime as needed for sleep.     Marland Kitchen tiZANidine (ZANAFLEX) 2 MG tablet Take 2 mg by mouth every 8 (eight) hours as needed.     . traMADol (ULTRAM) 50 MG tablet TAKE 1 TABLET BY MOUTH EVERY SIX HOURS AS NEEDED FOR SEVERE PAIN    . XARELTO 15 MG TABS tablet TAKE 1 TABLET BY MOUTH ONCE A DAY. (Patient taking differently: Take 15 mg by mouth at bedtime. ) 30 tablet 11   No facility-administered medications prior to visit.      Review of Systems:   Constitutional:   No  weight loss, night sweats,  Fevers, chills,  +fatigue, or  lassitude.  HEENT:   No headaches,  Difficulty swallowing,  Tooth/dental problems, or  Sore throat,                No sneezing, itching, ear ache, nasal congestion, post nasal drip,   CV:  No chest pain,  Orthopnea, PND, +swelling in lower extremities, anasarca, dizziness, palpitations, syncope.   GI  No heartburn, indigestion, abdominal  pain, nausea, vomiting, diarrhea, change in bowel habits, loss of appetite, bloody stools.   Resp: No shortness of breath with exertion or at rest.  No excess mucus, no productive cough,  No non-productive cough,  No coughing up of blood.  No change in color of mucus.  No wheezing.  No chest wall deformity  Skin: no rash or lesions.  GU: no dysuria, change in color of urine, no urgency or frequency.  No flank pain, no hematuria   MS:  No joint pain or swelling.  No decreased range of motion.  No back pain.    Physical Exam  BP 122/68 (BP Location: Left Arm, Cuff Size: Normal)   Pulse 69   Temp 98.1 F (36.7 C) (Oral)   Ht 4' 10.5" (1.486 m)   Wt 140 lb 6.4 oz (63.7 kg)   SpO2 96%   BMI 28.84 kg/m   GEN: A/Ox3; pleasant , NAD, well nourished    HEENT:  [place]/AT,   NOSE-clear, THROAT-clear, no lesions, no postnasal drip or exudate noted.   NECK:  Supple w/ fair ROM; no JVD; normal carotid impulses w/o bruits; no thyromegaly or nodules palpated; no lymphadenopathy.    RESP  Clear  P & A; w/o, wheezes/ rales/ or rhonchi. no accessory muscle use, no dullness to percussion  CARD:  RRR, no m/r/g, 1+ peripheral edema, pulses intact, no cyanosis or clubbing.  GI:   Soft & nt; nml bowel sounds; no organomegaly or masses detected.   Musco: Warm bil, no deformities or joint swelling noted.   Neuro: alert, no focal deficits noted.    Skin: Warm, no lesions or rashes    Lab Results:  CBC    Component Value Date/Time   WBC 7.0 02/19/2019 1418   RBC 3.82 (L) 02/19/2019 1418   HGB 10.6 (L) 02/19/2019 1418   HCT 37.2 02/19/2019 1418   PLT 315 02/19/2019 1418   MCV 97.4 02/19/2019 1418   MCH 27.7 02/19/2019 1418   MCHC 28.5 (L) 02/19/2019 1418  RDW 22.3 (H) 02/19/2019 1418   LYMPHSABS 1.3 02/19/2019 1418   MONOABS 0.9 02/19/2019 1418   EOSABS 0.3 02/19/2019 1418   BASOSABS 0.1 02/19/2019 1418    BMET    Component Value Date/Time   NA 142 03/11/2019 1552   NA 141  07/29/2010   K 3.4 (L) 03/11/2019 1552   K 3.6 07/29/2010   CL 109 03/11/2019 1552   CL 103 07/29/2010   CO2 24 03/11/2019 1552   CO2 25 07/29/2010   GLUCOSE 60 (L) 03/11/2019 1552   BUN 12 03/11/2019 1552   BUN 16 07/29/2010   CREATININE 1.13 03/11/2019 1552   CREATININE 1.64 (H) 06/22/2017 1415   CALCIUM 9.5 03/11/2019 1552   CALCIUM 9.4 07/29/2010   GFRNONAA 36 (L) 05/03/2018 1550   GFRNONAA 32 (L) 08/26/2013 0816   GFRAA 42 (L) 05/03/2018 1550   GFRAA 36 (L) 08/26/2013 0816    BNP    Component Value Date/Time   BNP 894.0 (H) 05/03/2018 1550    ProBNP    Component Value Date/Time   PROBNP 1,752.0 (H) 03/11/2019 1552    Imaging: Dg Chest 2 View  Result Date: 03/11/2019 CLINICAL DATA:  63 year old female with history of dyspnea. EXAM: CHEST - 2 VIEW COMPARISON:  Chest x-ray 07/18/2018. FINDINGS: Moderate right pleural effusion, similar to the prior study. Areas of chronic scarring and/or atelectasis in the right lung base. Left lung is clear. No left pleural effusion. No evidence of pulmonary edema. Mild cardiomegaly. Upper mediastinal contours are within normal limits. Aortic atherosclerosis. Left-sided biventricular pacemaker device with lead tips projecting over the expected location of the right ventricular apex and overlying the anterior wall the left ventricle via the coronary sinus and coronary veins. Surgical clips projecting over the thoracic inlet, presumably from prior thyroidectomy. IMPRESSION: 1. Stable chronic moderate right pleural effusion with areas of scarring and/or chronic atelectasis in the right lung base. 2. Mild cardiomegaly. 3. Aortic atherosclerosis. 4. Postoperative changes and support apparatus, as above. Electronically Signed   By: Vinnie Langton M.D.   On: 03/11/2019 16:00    ferumoxytol (FERAHEME) 510 mg in sodium chloride 0.9 % 100 mL IVPB    Date Action Dose Route User   Discharged on 02/24/2019   Admitted on 02/24/2019   02/03/2019 1327  Rate/Dose Verify (none) (none) Philippa Sicks, RN   02/03/2019 1327 Rate/Dose Change (none) (none) Philippa Sicks, RN   02/03/2019 1327 New Bag/Given 510 mg Intravenous Anselmo Rod, RN    ferumoxytol [facility]) 510 mg in sodium chloride 0.9 % 100 mL IVPB    Date Action Dose Route User   Discharged on 02/24/2019   Admitted on 02/24/2019   02/10/2019 0915 Rate/Dose Verify (none) (none) Page, Coralie Keens, RN   02/10/2019 0915 Rate/Dose Verify (none) (none) Page, Coralie Keens, RN   02/10/2019 0914 New Bag/Given 510 mg Intravenous Page, Coralie Keens, RN    0.9 %  sodium chloride infusion    Date Action Dose Route User   Discharged on 02/24/2019   Admitted on 02/24/2019   02/03/2019 1423 Rate/Dose Change (none) (none) Philippa Sicks, RN   02/03/2019 1422 Rate/Dose Change (none) (none) Philippa Sicks, RN   02/03/2019 1352 Rate/Dose Change (none) (none) Philippa Sicks, RN   02/03/2019 1351 Restarted (none) (none) Presnell, Zeb Comfort, RN   02/03/2019 1313 Rate/Dose Verify (none) (none) Presnell, Brandy D, RN    0.9 %  sodium chloride infusion    Date Action Dose Route User  Discharged on 02/24/2019   Admitted on 02/24/2019   02/10/2019 0933 Rate/Dose Verify (none) (none) Page, Coralie Keens, RN   02/10/2019 0932 Restarted (none) (none) Page, Coralie Keens, RN   02/10/2019 0900 Rate/Dose Verify (none) (none) Page, Coralie Keens, RN   02/10/2019 [phone] New Bag/Given (none) Intravenous Page, Coralie Keens, RN      PFT Results Latest Ref Rng & Units 07/18/2018 05/24/2017 12/04/2016  FVC-Pre L 1.42 1.40 1.72  FVC-Predicted Pre % 55 54 66  FVC-Post L - 1.56 1.67  FVC-Predicted Post % - 60 64  Pre FEV1/FVC % % 80 81 86  Post FEV1/FCV % % - 85 80  FEV1-Pre L 1.14 1.13 1.47  FEV1-Predicted Pre % 58 56 73  FEV1-Post L - 1.33 1.34  DLCO UNC% % 48 56 61  DLCO COR %Predicted % 85 86 87  TLC L - 3.32 3.78  TLC % Predicted % - 80 91  RV % Predicted % - 82 103    No results found for: NITRICOXIDE       Assessment & Plan:   Acute on chronic combined systolic and diastolic CHF (congestive heart failure) (HCC) Decompensated w/  improvement with diuresis Labs showed elevated BNP.  Patient was treated with extra 40 mg of Lasix for a total of 80 mg .  Weight is down 8 pounds.  She has perceived clinical benefit.  Leg swelling is decreased. 2D echo is pending for next week.  She has a follow-up cardiology appointment in 2 weeks. We will return back to Lasix 40 mg daily.  She has option to take an extra Lasix if needed.  Continue low-salt diet.  Keep legs elevated.  Continue on her current regimen.  Plan  Patient Instructions  Continue on BREO 1 puff daily , rinse after use.  Continue on Lasix 40 mg daily.  May take extra Lasix 40 mg daily if needed for weight gain or increased leg swelling Follow-up for 2D echo as planned Follow-up with cardiology as discussed Labs today. Follow up with Dr. Elsworth Soho  In 3  months and As needed   Please contact office for sooner follow up if symptoms do not improve or worsen or seek emergency care         Asthma Controlled on Breo  Plan  Patient Instructions  Continue on BREO 1 puff daily , rinse after use.  Continue on Lasix 40 mg daily.  May take extra Lasix 40 mg daily if needed for weight gain or increased leg swelling Follow-up for 2D echo as planned Follow-up with cardiology as discussed Labs today. Follow up with Dr. Elsworth Soho  In 3  months and As needed   Please contact office for sooner follow up if symptoms do not improve or worsen or seek emergency care       CKD (chronic kidney disease), stage III ([place]) Kidney function was stable last visit.  She did have some mild hypokalemia.  Potassium was replaced.  Labs today.  Continue to follow with primary care provider and cardiology     Rexene Edison, NP 03/20/2019

## 2019-03-20 NOTE — Patient Instructions (Signed)
Continue on BREO 1 puff daily , rinse after use.  Continue on Lasix 40 mg daily.  May take extra Lasix 40 mg daily if needed for weight gain or increased leg swelling Follow-up for 2D echo as planned Follow-up with cardiology as discussed Labs today. Follow up with Dr. Elsworth Soho  In 3  months and As needed   Please contact office for sooner follow up if symptoms do not improve or worsen or seek emergency care

## 2019-03-20 NOTE — Assessment & Plan Note (Signed)
Kidney function was stable last visit.  She did have some mild hypokalemia.  Potassium was replaced.  Labs today.  Continue to follow with primary care provider and cardiology

## 2019-03-21 MED ORDER — FUROSEMIDE 40 MG PO TABS: 40.0000 mg | ORAL_TABLET | Freq: Every day | ORAL | 0 refills | Status: AC

## 2019-03-21 NOTE — Addendum Note (Signed)
Addended by: Parke Poisson E on: 03/21/2019 04:34 PM   Modules accepted: Orders

## 2019-03-25 ENCOUNTER — Other Ambulatory Visit: Payer: Self-pay

## 2019-03-25 ENCOUNTER — Ambulatory Visit (HOSPITAL_COMMUNITY)
Admission: RE | Admit: 2019-03-25 | Discharge: 2019-03-25 | Disposition: A | Discharge status: Home / Self Care | Payer: PPO | Source: Ambulatory Visit | Attending: Adult Health | Admitting: Adult Health

## 2019-03-25 ENCOUNTER — Inpatient Hospital Stay (HOSPITAL_COMMUNITY): Payer: PPO | Attending: Hematology

## 2019-03-25 DIAGNOSIS — D649 Anemia, unspecified: Secondary | ICD-10-CM | POA: Insufficient documentation

## 2019-03-25 DIAGNOSIS — I5043 Acute on chronic combined systolic (congestive) and diastolic (congestive) heart failure: Secondary | ICD-10-CM | POA: Diagnosis not present

## 2019-03-25 DIAGNOSIS — D509 Iron deficiency anemia, unspecified: Secondary | ICD-10-CM

## 2019-03-25 LAB — CBC WITH DIFFERENTIAL/PLATELET
Abs Immature Granulocytes: 0.02 10*3/uL (ref 0.00–0.07)
Basophils Absolute: 0.1 10*3/uL (ref 0.0–0.1)
Basophils Relative: 1 %
Eosinophils Absolute: 0.3 10*3/uL (ref 0.0–0.5)
Eosinophils Relative: 3 %
HCT: 42.5 % (ref 36.0–46.0)
Hemoglobin: 12.4 g/dL (ref 12.0–15.0)
Immature Granulocytes: 0 %
Lymphocytes Relative: 16 %
Lymphs Abs: 1.4 10*3/uL (ref 0.7–4.0)
MCH: 28.8 pg (ref 26.0–34.0)
MCHC: 29.2 g/dL — ABNORMAL LOW (ref 30.0–36.0)
MCV: 98.8 fL (ref 80.0–100.0)
Monocytes Absolute: 0.9 10*3/uL (ref 0.1–1.0)
Monocytes Relative: 10 %
Neutro Abs: 6.1 10*3/uL (ref 1.7–7.7)
Neutrophils Relative %: 70 %
Platelets: 223 10*3/uL (ref 150–400)
RBC: 4.3 MIL/uL (ref 3.87–5.11)
RDW: 17.6 % — ABNORMAL HIGH (ref 11.5–15.5)
WBC: 8.7 10*3/uL (ref 4.0–10.5)
nRBC: 0 % (ref 0.0–0.2)

## 2019-03-25 LAB — IRON AND TIBC
Iron: 28 ug/dL (ref 28–170)
Saturation Ratios: 9 % — ABNORMAL LOW (ref 10.4–31.8)
TIBC: 328 ug/dL (ref 250–450)
UIBC: 300 ug/dL

## 2019-03-25 LAB — FERRITIN: Ferritin: 59 ng/mL (ref 11–307)

## 2019-03-25 NOTE — Progress Notes (Signed)
*  PRELIMINARY RESULTS* Echocardiogram 2D Echocardiogram has been performed.  [name] 03/25/2019, 9:22 AM

## 2019-03-27 DIAGNOSIS — I5042 Chronic combined systolic (congestive) and diastolic (congestive) heart failure: Secondary | ICD-10-CM | POA: Diagnosis not present

## 2019-03-27 DIAGNOSIS — R600 Localized edema: Secondary | ICD-10-CM | POA: Diagnosis not present

## 2019-03-27 DIAGNOSIS — R0600 Dyspnea, unspecified: Secondary | ICD-10-CM | POA: Diagnosis not present

## 2019-03-27 DIAGNOSIS — D509 Iron deficiency anemia, unspecified: Secondary | ICD-10-CM | POA: Diagnosis not present

## 2019-03-27 DIAGNOSIS — R0602 Shortness of breath: Secondary | ICD-10-CM | POA: Diagnosis not present

## 2019-04-01 ENCOUNTER — Encounter: Payer: Self-pay | Admitting: Internal Medicine

## 2019-04-01 ENCOUNTER — Encounter (HOSPITAL_COMMUNITY): Payer: Self-pay | Admitting: Hematology

## 2019-04-01 ENCOUNTER — Inpatient Hospital Stay (HOSPITAL_COMMUNITY): Payer: PPO | Attending: Hematology | Admitting: Hematology

## 2019-04-01 ENCOUNTER — Other Ambulatory Visit: Payer: Self-pay

## 2019-04-01 ENCOUNTER — Ambulatory Visit (INDEPENDENT_AMBULATORY_CARE_PROVIDER_SITE_OTHER): Payer: PPO | Admitting: Internal Medicine

## 2019-04-01 VITALS — BP 135/86 | HR 73 | Temp 96.6°F | Ht 58.5 in | Wt 142.0 lb

## 2019-04-01 VITALS — BP 131/84 | HR 71 | Temp 97.7°F | Resp 18 | Wt 145.1 lb

## 2019-04-01 DIAGNOSIS — N189 Chronic kidney disease, unspecified: Secondary | ICD-10-CM | POA: Diagnosis not present

## 2019-04-01 DIAGNOSIS — I5042 Chronic combined systolic (congestive) and diastolic (congestive) heart failure: Secondary | ICD-10-CM

## 2019-04-01 DIAGNOSIS — I4819 Other persistent atrial fibrillation: Secondary | ICD-10-CM

## 2019-04-01 DIAGNOSIS — E611 Iron deficiency: Secondary | ICD-10-CM | POA: Insufficient documentation

## 2019-04-01 DIAGNOSIS — I428 Other cardiomyopathies: Secondary | ICD-10-CM | POA: Diagnosis not present

## 2019-04-01 DIAGNOSIS — D649 Anemia, unspecified: Secondary | ICD-10-CM | POA: Diagnosis not present

## 2019-04-01 NOTE — Progress Notes (Signed)
HPI [name] returns today for followup. She is a pleasant 63 yo woman with a h/o atrial fib, amio lung, acute systolic heart failure, and HTN. She notes some worsening dypsnea with exertion. No anginal symptoms. No edema.She admits to some dietary indiscretion with sodium. Allergies  Allergen Reactions   Flecainide Nausea Only and Other (See Comments)    Faint feeling   Hydrocodone-Acetaminophen Nausea Only and Other (See Comments)    Severe headache   Ibuprofen Other (See Comments)    Kidney dysfunction   Oxycodone Hcl Nausea Only and Other (See Comments)    Headache   Penicillins Nausea Only and Other (See Comments)    Severe headache Has patient had a PCN reaction causing immediate rash, facial/tongue/throat swelling, SOB or lightheadedness with hypotension: No Has patient had a PCN reaction causing severe rash involving mucus membranes or skin necrosis: No Has patient had a PCN reaction that required hospitalization: No Has patient had a PCN reaction occurring within the last 10 years: No If all of the above answers are "NO", then may proceed with Cephalosporin use.      Current Outpatient Medications  Medication Sig Dispense Refill   acetaminophen (TYLENOL) 500 MG tablet Take 1,000 mg by mouth every 6 (six) hours as needed (for pain.).      BREO ELLIPTA 100-25 MCG/INH AEPB Inhale 2 puffs into the lungs daily.     cyclobenzaprine (FLEXERIL) 5 MG tablet Take 5 mg by mouth 3 (three) times daily as needed for muscle spasms.      ferrous sulfate (FEOSOL) 325 (65 FE) MG tablet Take 325 mg by mouth daily with breakfast.      furosemide (LASIX) 40 MG tablet Take 1 tablet (40 mg total) by mouth daily. May take extra 1 daily as needed 30 tablet 0   gabapentin (NEURONTIN) 100 MG capsule Take 100 mg by mouth 2 (two) times daily.      hydrALAZINE (APRESOLINE) 25 MG tablet Take 0.5 tablets (12.5 mg total) by mouth every 8 (eight) hours. 45 tablet 6   isosorbide  mononitrate (IMDUR) 30 MG 24 hr tablet Take 0.5 tablets (15 mg total) by mouth daily. 30 tablet 3   Lancets (ONETOUCH DELICA PLUS 123XX123) MISC      levothyroxine (SYNTHROID) 100 MCG tablet Take 100 mcg by mouth daily before breakfast.      loperamide (IMODIUM) 2 MG capsule Take 2-4 mg by mouth 4 (four) times daily as needed for diarrhea or loose stools.     metoprolol succinate (TOPROL-XL) 50 MG 24 hr tablet TAKE 1 TABLET BY MOUTH ONCE DAILY. 30 tablet 10   Multiple Vitamin (MULTIVITAMIN) tablet Take 1 tablet by mouth daily.     ONETOUCH ULTRA test strip      pantoprazole (PROTONIX) 40 MG tablet Take 40 mg by mouth 2 (two) times daily.      polyethylene glycol (MIRALAX / GLYCOLAX) packet Take 17 g by mouth daily as needed (for constipation.).      potassium chloride (K-DUR) 10 MEQ tablet Take 1 tablet (10 mEq total) by mouth every other day. (Patient taking differently: Take 10 mEq by mouth daily. )     pravastatin (PRAVACHOL) 10 MG tablet Take 10 mg by mouth daily.     PROAIR HFA 108 (90 Base) MCG/ACT inhaler INHALE 2 PUFFS EVERY 6 HOURS AS NEEDED FOR SHORTNESS OF BREATH AND WHEEZING. (Patient taking differently: Inhale 2 puffs into the lungs every 6 (six) hours as needed for  wheezing or shortness of breath. ) 8.5 g 2   Probiotic Product (ALIGN PO) Take 1 tablet by mouth daily.      sitaGLIPtin (JANUVIA) 50 MG tablet Take 50 mg by mouth every morning.      temazepam (RESTORIL) 15 MG capsule Take 15 mg by mouth at bedtime as needed for sleep.      tiZANidine (ZANAFLEX) 2 MG tablet Take 2 mg by mouth every 8 (eight) hours as needed.      traMADol (ULTRAM) 50 MG tablet TAKE 1 TABLET BY MOUTH EVERY SIX HOURS AS NEEDED FOR SEVERE PAIN     XARELTO 15 MG TABS tablet TAKE 1 TABLET BY MOUTH ONCE A DAY. (Patient taking differently: Take 15 mg by mouth at bedtime. ) 30 tablet 11   No current facility-administered medications for this visit.      Past Medical History:  Diagnosis Date    Amiodarone toxicity    Anemia    Chronic bronchitis    Chronic systolic CHF (congestive heart failure) ([place])    a. 04/2017 Echo: EF 25%.   Congenital heart disease    Diabetes mellitus without complication ([place])    Type II   Diverticulosis    Fall due to stumbling October 08, 2013   Due to shoes   GERD (gastroesophageal reflux disease)    Gout    Hemorrhoids    Hyperlipidemia    Hyperparathyroidism    Hypertension    Hypothyroidism    IDA (iron deficiency anemia)    Microscopic colitis 9/11   Colonoscopy   NICM (nonischemic cardiomyopathy) - tachycardia induced in the setting of AFib ([place])    a. 04/2017 Echo: EF 25%, basal-mid inferolateral, inf AK, triv AI, mild MR, sev dil LA, mod red RV fxn, mildly dil RA, mild to mod TR, PASP 28mmHg.   Non-obstructive CAD (coronary artery disease)    a. 04/2017 Cath: LM 20p, LCX nl, RCA 20p/m.   Paroxysmal SVT (supraventricular tachycardia) ([facility])    Persistent atrial fibrillation ([place]) 06/2013   a. 04/2017 Admit with CHF/NICM/AFib -> CHA2DS2VASc = 4-->Xarelto 15 mg daily started; b. 06/2017 s/p BiV PPM ([facility] ser # [id number]) and AVN ablation.   Presence of permanent cardiac pacemaker 06/29/2017   Biventricular pacemaker placed   PVD (peripheral vascular disease) ([place]) 09/2004   Right common femoral endarterectomy in April of 2007   Renal insufficiency    Schatzki's ring    Last EGD with esophageal dilatation 14F  9/11   Scoliosis    Varicose veins right leg pain and swelling    ROS:   All systems reviewed and negative except as noted in the HPI.   Past Surgical History:  Procedure Laterality Date   ASD REPAIR  Age [facility]   AV NODE ABLATION N/A 06/29/2017   Procedure: AV NODE ABLATION;  Surgeon: Evans Lance, MD;  Location: [facility];  Service: Cardiovascular;  Laterality: N/A;   BIOPSY  02/13/2017   Procedure: BIOPSY;  Surgeon: Daneil Dolin, MD;   [facility];  Service: Endoscopy;;  duodenum gastric   BIV PACEMAKER INSERTION CRT-P N/A 06/29/2017   Procedure: BIV PACEMAKER INSERTION CRT-P;  Surgeon: Evans Lance, MD;  Location: [facility];  Service: Cardiovascular;  Laterality: N/A;   CARDIOVERSION N/A 11/13/2013   Procedure: CARDIOVERSION;  Surgeon: Herminio Commons, MD;  Location: [facility];  Service: Endoscopy;  Laterality: N/A;   COLONOSCOPY  03/04/2010   anal canal hemorrhoids otherwise normal. TI normal. Bx showed lymphocytic colitis. next TCS 02/2020   COLONOSCOPY  April 2008   [facility]hman: Pancolonic diverticulosis, external hemorrhoids   COLONOSCOPY N/A 11/30/2015   RMR: Normal terminal ileum for 10 cm, nonbleeding grade 1 internal hemorrhoids, colonic diverticulosis. Next colonoscopy in 2027.   COLONOSCOPY WITH PROPOFOL N/A 02/24/2019   Procedure: COLONOSCOPY WITH PROPOFOL;  Surgeon: Daneil Dolin, MD;  [facility];  Service: Endoscopy;  Laterality: N/A;  11:15am   ELAS  06-01-11   Right saphenous ELAS    ESOPHAGEAL DILATION N/A 05/28/2015   Procedure: ESOPHAGEAL DILATION;  Surgeon: Daneil Dolin, MD;  [facility];  Service: Endoscopy;  Laterality: N/A;   ESOPHAGOGASTRODUODENOSCOPY  03/04/2010   noncritical appearing Schatzki ring. SB bx negative   ESOPHAGOGASTRODUODENOSCOPY  05/2008   erosive reflux esophagatitis, noncritical Schatzki ring   ESOPHAGOGASTRODUODENOSCOPY N/A 05/28/2015   [id number] esophagus somewhat baggy, likely due to underlying esophageal motility disorder/small HH    ESOPHAGOGASTRODUODENOSCOPY N/A 02/13/2017   Dr. Gala Romney: subtly abnormal stomach of doubtful clinical significant s/p biopsy, normal duodenum s/p biopsy, fundic gland polyp on path, normal duodenal biopsy   ESOPHAGOGASTRODUODENOSCOPY (EGD) WITH PROPOFOL N/A 02/24/2019   Procedure: ESOPHAGOGASTRODUODENOSCOPY (EGD) WITH PROPOFOL;  Surgeon: Daneil Dolin, MD;  [facility];  Service:  Endoscopy;  Laterality: N/A;   HEMORRHOID SURGERY  2010   Left hemithyroidectomy     Left parathyroidectomy  2007   MALONEY DILATION N/A 02/24/2019   Procedure: MALONEY DILATION;  Surgeon: Daneil Dolin, MD;  [facility];  Service: Endoscopy;  Laterality: N/A;   Parathyroid adenoma  2007   Right common femoral endarterectomy  2007   RIGHT/LEFT HEART CATH AND CORONARY ANGIOGRAPHY N/A 05/15/2017   Procedure: RIGHT/LEFT HEART CATH AND CORONARY ANGIOGRAPHY;  Surgeon: Troy Sine, MD;  Location: [facility];  Service: Cardiovascular;  Laterality: N/A;   Small bowel capsule  12 2009   Mid to distal small bowel with edema, erosions, tiny ulceration felt to be NSAID related   TONSILLECTOMY       Family History  Problem Relation Age of Onset   Stroke Mother    Heart disease Mother        CHF  - Amputation-Right Leg   Hyperlipidemia Mother    Hypertension Mother    Congestive Heart Failure Mother    Lung cancer Father    Cancer Father        Lung   Other Brother        back problems   Heart attack Paternal Grandmother    Cancer Maternal Grandmother        uterine   Colon cancer Neg Hx    Colon polyps Neg Hx      Social History   Socioeconomic History   Marital status: Single    Spouse name: Not on file   Number of children: 0   Years of education: Not on file   Highest education level: Not on file  Occupational History   Occupation: Sales promotion account executive  Social Needs   Financial resource strain: Not on file   Food insecurity    Worry: Not on file    Inability: Not on file   Transportation needs    Medical: Not on file    Non-medical: Not on file  Tobacco Use   Smoking status: Never Smoker   Smokeless tobacco: Never Used   Tobacco comment: Never smoked  Substance and Sexual Activity   Alcohol use: No    Alcohol/week: 0.0 standard drinks   Drug use: No   Sexual activity: Never    Birth control/protection:  Post-menopausal  Lifestyle   Physical activity    Days per week: Not on file    Minutes per session: Not on file   Stress: Not on file  Relationships   Social connections    Talks on phone: Not on file    Gets together: Not on file    Attends religious service: Not on file    Active member of club or organization: Not on file    Attends meetings of clubs or organizations: Not on file    Relationship status: Not on file   Intimate partner violence    Fear of current or ex partner: Not on file    Emotionally abused: Not on file    Physically abused: Not on file    Forced sexual activity: Not on file  Other Topics Concern   Not on file  Social History Narrative   Not on file     BP 135/86    Pulse 73    Temp (!) 96.6 F (35.9 C) (Temporal)    Ht 4' 10.5" (1.486 m)    Wt 142 lb (64.4 kg)    SpO2 95%    BMI 29.17 kg/m   Physical Exam:  Well appearing 63 yo woman, NAD HEENT: Unremarkable Neck:  6 cm JVD, no thyromegally Lymphatics:  No adenopathy Back:  No CVA tenderness Lungs:  Clear with no wheezes HEART:  Regular rate rhythm, no murmurs, no rubs, no clicks Abd:  soft, positive bowel sounds, no organomegally, no rebound, no guarding Ext:  2 plus pulses, no edema, no cyanosis, no clubbing Skin:  No rashes no nodules Neuro:  CN II through XII intact, motor grossly intact  DEVICE  Normal device function.  See PaceArt for details.   Assess/Plan: 1. Atrial fib - her VR is well controlled, s/p PPM insertion/AV node ablation. 2. PPM - her [employer] Biv PM is working normally. We will recheck in several months. 3. Chronic systolic heart failure - she has class 2 symptoms and her EF is around 45%. She is encouraged to increase her physical activity. 4. Interstitial lung disease - she was previously on amiodarone and this has been stopped for over 2 years.   Mikle Bosworth.D.

## 2019-04-01 NOTE — Assessment & Plan Note (Signed)
1.  Normocytic anemia: -Etiology CKD and iron deficiency. -Colonoscopy on 11/30/2015 shows nonbleeding internal hemorrhoids, colonic diverticulosis. -EGD on 02/13/2017 shows normal esophagus, slightly abnormal stomach, normal duodenal bulb and second part of duodenum. - She received Feraheme on 02/03/2019 and 02/10/2019. - She is feeling better in terms of energy.  We reviewed her blood work from today.  Hemoglobin improved to 12.4.  Ferritin is down to 59.  Percent saturation is 9. - Based on low ferritin and percent saturation, I have recommended 2 more infusions of Feraheme. - She is having constipation from taking oral iron therapy.  She is having occasional rectal bleeding from constipation.  Hence I will told her to discontinue oral iron pills.  2.  CHF: -She had a pacemaker.  Ejection fraction is 25%. - She will continue metoprolol XL, hydralazine, Imdur.  She will also continue Xarelto for A. fib.  3.  Diabetic neuropathy: -We will continue gabapentin 100 mg 3 times a day.

## 2019-04-01 NOTE — Progress Notes (Signed)
[place], [place] [address]   CLINIC:  Medical Oncology/Hematology  PCP:  Celene Squibb, MD [place] [address] [phone]   REASON FOR VISIT:  Follow-up for normocytic anemia.  CURRENT THERAPY: Intermittent iron therapy.   INTERVAL HISTORY:  [name] 63 y.o. female seen for follow-up of her normocytic anemia.  She received Feraheme infusion on 02/03/2019 and 02/10/2019.  Felt much improvement in energy levels after that.  Appetite is 100%.  Energy levels are 50%.  She is having constipation from taking iron pills.  She is also having rectal bleeding as result of it.  Denies any fevers or chills.    REVIEW OF SYSTEMS:  Review of Systems  Respiratory: Positive for shortness of breath.   Gastrointestinal: Positive for constipation.  Psychiatric/Behavioral: Positive for sleep disturbance.  All other systems reviewed and are negative.    PAST MEDICAL/SURGICAL HISTORY:  Past Medical History:  Diagnosis Date  . Amiodarone toxicity   . Anemia   . Chronic bronchitis   . Chronic systolic CHF (congestive heart failure) ([place])    a. 04/2017 Echo: EF 25%.  . Congenital heart disease   . Diabetes mellitus without complication ([place])    Type II  . Diverticulosis   . Fall due to stumbling October 08, 2013   Due to shoes  . GERD (gastroesophageal reflux disease)   . Gout   . Hemorrhoids   . Hyperlipidemia   . Hyperparathyroidism   . Hypertension   . Hypothyroidism   . IDA (iron deficiency anemia)   . Microscopic colitis 9/11   Colonoscopy  . NICM (nonischemic cardiomyopathy) - tachycardia induced in the setting of AFib ([place])    a. 04/2017 Echo: EF 25%, basal-mid inferolateral, inf AK, triv AI, mild MR, sev dil LA, mod red RV fxn, mildly dil RA, mild to mod TR, PASP 60mmHg.  . Non-obstructive CAD (coronary artery disease)    a. 04/2017 Cath: LM 20p, LCX nl, RCA 20p/m.  Marland Kitchen Paroxysmal SVT (supraventricular tachycardia)  ([place])   . Persistent atrial fibrillation ([place]) 06/2013   a. 04/2017 Admit with CHF/NICM/AFib -> CHA2DS2VASc = 4-->Xarelto 15 mg daily started; b. 06/2017 s/p BiV PPM (SJM ser # [id number]) and AVN ablation.  . Presence of permanent cardiac pacemaker 06/29/2017   Biventricular pacemaker placed  . PVD (peripheral vascular disease) ([place]) 09/2004   Right common femoral endarterectomy in April of 2007  . Renal insufficiency   . Schatzki's ring    Last EGD with esophageal dilatation 46F  9/11  . Scoliosis   . Varicose veins right leg pain and swelling   Past Surgical History:  Procedure Laterality Date  . ASD REPAIR  Age 42   [facility]  . AV NODE ABLATION N/A 06/29/2017   Procedure: AV NODE ABLATION;  Surgeon: Evans Lance, MD;  Location: [place] [facility];  Service: Cardiovascular;  Laterality: N/A;  . BIOPSY  02/13/2017   Procedure: BIOPSY;  Surgeon: Daneil Dolin, MD;  [facility];  Service: Endoscopy;;  duodenum gastric  . BIV PACEMAKER INSERTION CRT-P N/A 06/29/2017   Procedure: BIV PACEMAKER INSERTION CRT-P;  Surgeon: Evans Lance, MD;  Location: [place] [facility];  Service: Cardiovascular;  Laterality: N/A;  . CARDIOVERSION N/A 11/13/2013   Procedure: CARDIOVERSION;  Surgeon: Herminio Commons, MD;  Location: [facility] ORS;  Service: Endoscopy;  Laterality: N/A;  . COLONOSCOPY  03/04/2010  anal canal hemorrhoids otherwise normal. TI normal. Bx showed lymphocytic colitis. next TCS 02/2020  . COLONOSCOPY  April 2008   Rehman: Pancolonic diverticulosis, external hemorrhoids  . COLONOSCOPY N/A 11/30/2015   RMR: Normal terminal ileum for 10 cm, nonbleeding grade 1 internal hemorrhoids, colonic diverticulosis. Next colonoscopy in 2027.  Marland Kitchen COLONOSCOPY WITH PROPOFOL N/A 02/24/2019   Procedure: COLONOSCOPY WITH PROPOFOL;  Surgeon: Daneil Dolin, MD;  [facility];  Service: Endoscopy;  Laterality: N/A;  11:15am  . ELAS  06-01-11   Right  saphenous ELAS   . ESOPHAGEAL DILATION N/A 05/28/2015   Procedure: ESOPHAGEAL DILATION;  Surgeon: Daneil Dolin, MD;  [facility];  Service: Endoscopy;  Laterality: N/A;  . ESOPHAGOGASTRODUODENOSCOPY  03/04/2010   noncritical appearing Schatzki ring. SB bx negative  . ESOPHAGOGASTRODUODENOSCOPY  05/2008   erosive reflux esophagatitis, noncritical Schatzki ring  . ESOPHAGOGASTRODUODENOSCOPY N/A 05/28/2015   [id number] esophagus somewhat baggy, likely due to underlying esophageal motility disorder/small HH   . ESOPHAGOGASTRODUODENOSCOPY N/A 02/13/2017   Dr. Gala Romney: subtly abnormal stomach of doubtful clinical significant s/p biopsy, normal duodenum s/p biopsy, fundic gland polyp on path, normal duodenal biopsy  . ESOPHAGOGASTRODUODENOSCOPY (EGD) WITH PROPOFOL N/A 02/24/2019   Procedure: ESOPHAGOGASTRODUODENOSCOPY (EGD) WITH PROPOFOL;  Surgeon: Daneil Dolin, MD;  [facility];  Service: Endoscopy;  Laterality: N/A;  . [place] SURGERY  2010  . Left hemithyroidectomy    . Left parathyroidectomy  2007  . MALONEY DILATION N/A 02/24/2019   Procedure: Venia Minks DILATION;  Surgeon: Daneil Dolin, MD;  [facility];  Service: Endoscopy;  Laterality: N/A;  . Parathyroid adenoma  2007  . Right common femoral endarterectomy  2007  . RIGHT/LEFT HEART CATH AND CORONARY ANGIOGRAPHY N/A 05/15/2017   Procedure: RIGHT/LEFT HEART CATH AND CORONARY ANGIOGRAPHY;  Surgeon: Troy Sine, MD;  Location: [place] [facility];  Service: Cardiovascular;  Laterality: N/A;  . Small bowel capsule  12 2009   Mid to distal small bowel with edema, erosions, tiny ulceration felt to be NSAID related  . TONSILLECTOMY       SOCIAL HISTORY:  Social History   Socioeconomic History  . Marital status: Single    Spouse name: Not on file  . Number of children: 0  . Years of education: Not on file  . Highest education level: Not on file  Occupational History  . Occupation: Sales promotion account executive   Social Needs  . Financial resource strain: Not on file  . Food insecurity    Worry: Not on file    Inability: Not on file  . Transportation needs    Medical: Not on file    Non-medical: Not on file  Tobacco Use  . Smoking status: Never Smoker  . Smokeless tobacco: Never Used  . Tobacco comment: Never smoked  Substance and Sexual Activity  . Alcohol use: No    Alcohol/week: 0.0 standard drinks  . Drug use: No  . Sexual activity: Never    Birth control/protection: Post-menopausal  Lifestyle  . Physical activity    Days per week: Not on file    Minutes per session: Not on file  . Stress: Not on file  Relationships  . Social Herbalist on phone: Not on file    Gets together: Not on file    Attends religious service: Not on file    Active member of club or organization: Not on file    Attends meetings of clubs or  organizations: Not on file    Relationship status: Not on file  . Intimate partner violence    Fear of current or ex partner: Not on file    Emotionally abused: Not on file    Physically abused: Not on file    Forced sexual activity: Not on file  Other Topics Concern  . Not on file  Social History Narrative  . Not on file    FAMILY HISTORY:  Family History  Problem Relation Age of Onset  . Stroke Mother   . Heart disease Mother        CHF  - Amputation-Right Leg  . Hyperlipidemia Mother   . Hypertension Mother   . Congestive Heart Failure Mother   . Lung cancer Father   . Cancer Father        Lung  . Other Brother        back problems  . Heart attack Paternal Grandmother   . Cancer Maternal Grandmother        uterine  . Colon cancer Neg Hx   . Colon polyps Neg Hx     CURRENT MEDICATIONS:  Outpatient Encounter Medications as of 04/01/2019  Medication Sig  . BREO ELLIPTA 100-25 MCG/INH AEPB Inhale 2 puffs into the lungs daily.  . ferrous sulfate (FEOSOL) 325 (65 FE) MG tablet Take 325 mg by mouth daily with breakfast.   . furosemide  (LASIX) 40 MG tablet Take 1 tablet (40 mg total) by mouth daily. May take extra 1 daily as needed  . gabapentin (NEURONTIN) 100 MG capsule Take 100 mg by mouth 2 (two) times daily.   Marland Kitchen GAVILYTE-N WITH FLAVOR PACK 420 g solution   . hydrALAZINE (APRESOLINE) 25 MG tablet Take 0.5 tablets (12.5 mg total) by mouth every 8 (eight) hours.  . isosorbide mononitrate (IMDUR) 30 MG 24 hr tablet Take 0.5 tablets (15 mg total) by mouth daily.  . Lancets (ONETOUCH DELICA PLUS [id number]) MISC   . levothyroxine (SYNTHROID) 100 MCG tablet Take 100 mcg by mouth daily before breakfast.   . metoprolol succinate (TOPROL-XL) 50 MG 24 hr tablet TAKE 1 TABLET BY MOUTH ONCE DAILY.  . Multiple Vitamin (MULTIVITAMIN) tablet Take 1 tablet by mouth daily.  Glory Rosebush ULTRA test strip   . pantoprazole (PROTONIX) 40 MG tablet Take 40 mg by mouth 2 (two) times daily.   . potassium chloride (K-DUR) 10 MEQ tablet Take 1 tablet (10 mEq total) by mouth every other day. (Patient taking differently: Take 10 mEq by mouth daily. )  . pravastatin (PRAVACHOL) 10 MG tablet Take 10 mg by mouth daily.  . Probiotic Product (ALIGN PO) Take 1 tablet by mouth daily.   . sitaGLIPtin (JANUVIA) 50 MG tablet Take 50 mg by mouth every morning.   Alveda Reasons 15 MG TABS tablet TAKE 1 TABLET BY MOUTH ONCE A DAY. (Patient taking differently: Take 15 mg by mouth at bedtime. )  . acetaminophen (TYLENOL) 500 MG tablet Take 1,000 mg by mouth every 6 (six) hours as needed (for pain.).   Marland Kitchen cyclobenzaprine (FLEXERIL) 5 MG tablet Take 5 mg by mouth 3 (three) times daily as needed for muscle spasms.   Marland Kitchen loperamide (IMODIUM) 2 MG capsule Take 2-4 mg by mouth 4 (four) times daily as needed for diarrhea or loose stools.  . polyethylene glycol (MIRALAX / GLYCOLAX) packet Take 17 g by mouth daily as needed (for constipation.).   Marland Kitchen PROAIR HFA 108 (90 Base) MCG/ACT inhaler INHALE 2 PUFFS EVERY 6  HOURS AS NEEDED FOR SHORTNESS OF BREATH AND WHEEZING. (Patient not taking:  No sig reported)  . temazepam (RESTORIL) 15 MG capsule Take 15 mg by mouth at bedtime as needed for sleep.   Marland Kitchen tiZANidine (ZANAFLEX) 2 MG tablet Take 2 mg by mouth every 8 (eight) hours as needed.   . traMADol (ULTRAM) 50 MG tablet TAKE 1 TABLET BY MOUTH EVERY SIX HOURS AS NEEDED FOR SEVERE PAIN   No facility-administered encounter medications on file as of 04/01/2019.     ALLERGIES:  Allergies  Allergen Reactions  . Flecainide Nausea Only and Other (See Comments)    Faint feeling  . Hydrocodone-Acetaminophen Nausea Only and Other (See Comments)    Severe headache  . Ibuprofen Other (See Comments)    Kidney dysfunction  . Oxycodone Hcl Nausea Only and Other (See Comments)    Headache  . Penicillins Nausea Only and Other (See Comments)    Severe headache Has patient had a PCN reaction causing immediate rash, facial/tongue/throat swelling, SOB or lightheadedness with hypotension: No Has patient had a PCN reaction causing severe rash involving mucus membranes or skin necrosis: No Has patient had a PCN reaction that required hospitalization: No Has patient had a PCN reaction occurring within the last 10 years: No If all of the above answers are "NO", then may proceed with Cephalosporin use.      PHYSICAL EXAM:  ECOG Performance status: 1  Vitals:   04/01/19 1434  BP: 131/84  Pulse: 71  Resp: 18  Temp: 97.7 F (36.5 C)  SpO2: 98%   Filed Weights   04/01/19 1434  Weight: 145 lb 1.6 oz (65.8 kg)    Physical Exam Vitals signs reviewed.  Constitutional:      Appearance: Normal appearance.  Cardiovascular:     Rate and Rhythm: Normal rate and regular rhythm.     Heart sounds: Normal heart sounds.  Pulmonary:     Effort: Pulmonary effort is normal.     Breath sounds: Normal breath sounds.  Abdominal:     General: There is no distension.     Palpations: Abdomen is soft. There is no mass.  Musculoskeletal:        General: No swelling.  Skin:    General: Skin is warm.   Neurological:     General: No focal deficit present.     Mental Status: She is alert and oriented to person, place, and time.  Psychiatric:        Mood and Affect: Mood normal.        Behavior: Behavior normal.      LABORATORY DATA:  I have reviewed the labs as listed.  CBC    Component Value Date/Time   WBC 8.7 03/25/2019 0946   RBC 4.30 03/25/2019 0946   HGB 12.4 03/25/2019 0946   HCT 42.5 03/25/2019 0946   PLT 223 03/25/2019 0946   MCV 98.8 03/25/2019 0946   MCH 28.8 03/25/2019 0946   MCHC 29.2 (L) 03/25/2019 0946   RDW 17.6 (H) 03/25/2019 0946   LYMPHSABS 1.4 03/25/2019 0946   MONOABS 0.9 03/25/2019 0946   EOSABS 0.3 03/25/2019 0946   BASOSABS 0.1 03/25/2019 0946   CMP Latest Ref Rng & Units 03/20/2019 03/11/2019 05/03/2018  Glucose 70 - 99 mg/dL 89 60(L) 74  BUN 6 - 23 mg/dL 20 12 17   Creatinine 0.40 - 1.20 mg/dL 1.24(H) 1.13 1.51(H)  Sodium 135 - 145 mEq/L 142 142 139  Potassium 3.5 - 5.1 mEq/L 3.7 3.4(L) 4.4  Chloride 96 - 112 mEq/L 106 109 107  CO2 19 - 32 mEq/L 27 24 26   Calcium 8.4 - 10.5 mg/dL 10.0 9.5 9.2  Total Protein 6.5 - 8.1 g/dL - - -  Total Bilirubin 0.3 - 1.2 mg/dL - - -  Alkaline Phos 38 - 126 U/L - - -  AST 15 - 41 U/L - - -  ALT 14 - 54 U/L - - -       DIAGNOSTIC IMAGING:  I have independently reviewed the scans and discussed with the patient.     ASSESSMENT & PLAN:   Normocytic anemia 1.  Normocytic anemia: -Etiology CKD and iron deficiency. -Colonoscopy on 11/30/2015 shows nonbleeding internal hemorrhoids, colonic diverticulosis. -EGD on 02/13/2017 shows normal esophagus, slightly abnormal stomach, normal duodenal bulb and second part of duodenum. - She received Feraheme on 02/03/2019 and 02/10/2019. - She is feeling better in terms of energy.  We reviewed her blood work from today.  Hemoglobin improved to 12.4.  Ferritin is down to 59.  Percent saturation is 9. - Based on low ferritin and percent saturation, I have recommended 2 more  infusions of Feraheme. - She is having constipation from taking oral iron therapy.  She is having occasional rectal bleeding from constipation.  Hence I will told her to discontinue oral iron pills.  2.  CHF: -She had a pacemaker.  Ejection fraction is 25%. - She will continue metoprolol XL, hydralazine, Imdur.  She will also continue Xarelto for A. fib.  3.  Diabetic neuropathy: -We will continue gabapentin 100 mg 3 times a day.  Total time spent is 25 minutes with more than 50% of the time spent face-to-face discussing treatment plan, counseling and coordination of care.    Orders placed this encounter:  Orders Placed This Encounter  Procedures  . Comprehensive metabolic panel  . Iron and TIBC  . Ferritin  . Vitamin B12  . Folate      Derek Jack, MD [place] [phone]

## 2019-04-01 NOTE — Patient Instructions (Signed)
Medication Instructions:  Your physician recommends that you continue on your current medications as directed. Please refer to the Current Medication list given to you today.  If you need a refill on your cardiac medications before your next appointment, please call your pharmacy.   Lab work: NONE  If you have labs (blood work) drawn today and your tests are completely normal, you will receive your results only by: . MyChart Message (if you have MyChart) OR . A paper copy in the mail If you have any lab test that is abnormal or we need to change your treatment, we will call you to review the results.  Testing/Procedures: NONE   Follow-Up: At [facility], you and your health needs are our priority.  As part of our continuing mission to provide you with exceptional heart care, we have created designated Provider Care Teams.  These Care Teams include your primary Cardiologist (physician) and Advanced Practice Providers (APPs -  Physician Assistants and Nurse Practitioners) who all work together to provide you with the care you need, when you need it. You will need a follow up appointment in 1 years.  Please call our office 2 months in advance to schedule this appointment.  You may see None or one of the following Advanced Practice Providers on your designated Care Team:   Amber Seiler, NP . Renee Ursuy, PA-C  Any Other Special Instructions Will Be Listed Below (If Applicable). Thank you for choosing [facility]!     

## 2019-04-03 ENCOUNTER — Inpatient Hospital Stay (HOSPITAL_COMMUNITY): Payer: PPO

## 2019-04-03 ENCOUNTER — Other Ambulatory Visit: Payer: Self-pay

## 2019-04-03 VITALS — BP 112/75 | HR 70 | Temp 96.8°F | Resp 18

## 2019-04-03 DIAGNOSIS — D509 Iron deficiency anemia, unspecified: Secondary | ICD-10-CM

## 2019-04-03 DIAGNOSIS — N189 Chronic kidney disease, unspecified: Secondary | ICD-10-CM | POA: Diagnosis not present

## 2019-04-03 MED ORDER — SODIUM CHLORIDE 0.9 % IV SOLN
510.0000 mg | Freq: Once | INTRAVENOUS | Status: AC
  Administered 2019-04-03: 09:00:00 510 mg via INTRAVENOUS
  Filled 2019-04-03: qty 510

## 2019-04-03 MED ORDER — SODIUM CHLORIDE 0.9 % IV SOLN
INTRAVENOUS | Status: DC
  Administered 2019-04-03: 08:00:00 via INTRAVENOUS

## 2019-04-03 NOTE — Progress Notes (Signed)
[name] presents today for IV iron infusion. Infusion tolerated without incident or complaint. See MAR for details. VSS prior to and post infusion. Discharged in satisfactory condition with follow up instructions.

## 2019-04-03 NOTE — Patient Instructions (Signed)
[place] at [facility]  Discharge Instructions:  You received an IV iron infusion today. Follow up as previously scheduled. _______________________________________________________________  Thank you for choosing [place] at [facility] to provide your oncology and hematology care.  To afford each patient quality time with our providers, please arrive at least 15 minutes before your scheduled appointment.  You need to re-schedule your appointment if you arrive 10 or more minutes late.  We strive to give you quality time with our providers, and arriving late affects you and other patients whose appointments are after yours.  Also, if you no show three or more times for appointments you may be dismissed from the clinic.  Again, thank you for choosing [place] at [facility] hope is that these requests will allow you access to exceptional care and in a timely manner. _______________________________________________________________  If you have questions after your visit, please contact our office at [phone] between the hours of 8:30 a.m. and 5:00 p.m. Voicemails left after 4:30 p.m. will not be returned until the following business day. _______________________________________________________________  For prescription refill requests, have your pharmacy contact our office. _______________________________________________________________  Recommendations made by the consultant and any test results will be sent to your referring physician. _______________________________________________________________

## 2019-04-11 ENCOUNTER — Inpatient Hospital Stay (HOSPITAL_COMMUNITY): Payer: PPO

## 2019-04-11 ENCOUNTER — Other Ambulatory Visit: Payer: Self-pay

## 2019-04-11 ENCOUNTER — Encounter (HOSPITAL_COMMUNITY): Payer: Self-pay

## 2019-04-11 VITALS — BP 125/80 | HR 70 | Temp 96.6°F | Resp 18

## 2019-04-11 DIAGNOSIS — D509 Iron deficiency anemia, unspecified: Secondary | ICD-10-CM

## 2019-04-11 DIAGNOSIS — N189 Chronic kidney disease, unspecified: Secondary | ICD-10-CM | POA: Diagnosis not present

## 2019-04-11 MED ORDER — SODIUM CHLORIDE 0.9 % IV SOLN
510.0000 mg | Freq: Once | INTRAVENOUS | Status: AC
  Administered 2019-04-11: 510 mg via INTRAVENOUS
  Filled 2019-04-11: qty 510

## 2019-04-11 MED ORDER — SODIUM CHLORIDE 0.9 % IV SOLN
INTRAVENOUS | Status: DC
  Administered 2019-04-11: 12:00:00 via INTRAVENOUS

## 2019-04-11 NOTE — Progress Notes (Signed)
[name] tolerated Feraheme infusion well without complaints or incident. Peripheral IV site checked with positive blood return noted prior to and after infusion. VSS upon discharge. Pt discharged self ambulatory in satisfactory condition

## 2019-04-11 NOTE — Patient Instructions (Signed)
[facility] at [facility] Discharge Instructions  Received Feraheme infusion today. Follow-up as scheduled. Call clinic for any questions or concerns   Thank you for choosing [facility] at [facility] to provide your oncology and hematology care.  To afford each patient quality time with our provider, please arrive at least 15 minutes before your scheduled appointment time.   If you have a lab appointment with the Cancer Center please come in thru the Main Entrance and check in at the main information desk.  You need to re-schedule your appointment should you arrive 10 or more minutes late.  We strive to give you quality time with our providers, and arriving late affects you and other patients whose appointments are after yours.  Also, if you no show three or more times for appointments you may be dismissed from the clinic at the providers discretion.     Again, thank you for choosing [facility].  Our hope is that these requests will decrease the amount of time that you wait before being seen by our physicians.       _____________________________________________________________  Should you have questions after your visit to [facility], please contact our office at [phone] between the hours of 8:00 a.m. and 4:30 p.m.  Voicemails left after 4:00 p.m. will not be returned until the following business day.  For prescription refill requests, have your pharmacy contact our office and allow 72 hours.    Due to Covid, you will need to wear a mask upon entering the hospital. If you do not have a mask, a mask will be given to you at the Main Entrance upon arrival. For doctor visits, patients may have 1 support person with them. For treatment visits, patients can not have anyone with them due to social distancing guidelines and our immunocompromised population.     

## 2019-04-14 ENCOUNTER — Encounter: Payer: PPO | Admitting: *Deleted

## 2019-04-14 DIAGNOSIS — Z6831 Body mass index (BMI) 31.0-31.9, adult: Secondary | ICD-10-CM | POA: Diagnosis not present

## 2019-04-14 DIAGNOSIS — M25511 Pain in right shoulder: Secondary | ICD-10-CM | POA: Diagnosis not present

## 2019-04-14 DIAGNOSIS — N183 Chronic kidney disease, stage 3 unspecified: Secondary | ICD-10-CM | POA: Diagnosis not present

## 2019-04-14 DIAGNOSIS — Z6833 Body mass index (BMI) 33.0-33.9, adult: Secondary | ICD-10-CM | POA: Diagnosis not present

## 2019-04-14 DIAGNOSIS — M109 Gout, unspecified: Secondary | ICD-10-CM | POA: Diagnosis not present

## 2019-04-14 DIAGNOSIS — Z0000 Encounter for general adult medical examination without abnormal findings: Secondary | ICD-10-CM | POA: Diagnosis not present

## 2019-04-14 DIAGNOSIS — I5040 Unspecified combined systolic (congestive) and diastolic (congestive) heart failure: Secondary | ICD-10-CM | POA: Diagnosis not present

## 2019-04-14 DIAGNOSIS — E782 Mixed hyperlipidemia: Secondary | ICD-10-CM | POA: Diagnosis not present

## 2019-04-14 DIAGNOSIS — R600 Localized edema: Secondary | ICD-10-CM | POA: Diagnosis not present

## 2019-04-14 DIAGNOSIS — E039 Hypothyroidism, unspecified: Secondary | ICD-10-CM | POA: Diagnosis not present

## 2019-04-14 DIAGNOSIS — Z0001 Encounter for general adult medical examination with abnormal findings: Secondary | ICD-10-CM | POA: Diagnosis not present

## 2019-04-14 DIAGNOSIS — E1122 Type 2 diabetes mellitus with diabetic chronic kidney disease: Secondary | ICD-10-CM | POA: Diagnosis not present

## 2019-04-14 DIAGNOSIS — R5382 Chronic fatigue, unspecified: Secondary | ICD-10-CM | POA: Diagnosis not present

## 2019-04-14 DIAGNOSIS — R2241 Localized swelling, mass and lump, right lower limb: Secondary | ICD-10-CM | POA: Diagnosis not present

## 2019-05-12 DIAGNOSIS — R600 Localized edema: Secondary | ICD-10-CM | POA: Diagnosis not present

## 2019-05-12 DIAGNOSIS — D509 Iron deficiency anemia, unspecified: Secondary | ICD-10-CM | POA: Diagnosis not present

## 2019-05-12 DIAGNOSIS — I5042 Chronic combined systolic (congestive) and diastolic (congestive) heart failure: Secondary | ICD-10-CM | POA: Diagnosis not present

## 2019-05-12 DIAGNOSIS — R0600 Dyspnea, unspecified: Secondary | ICD-10-CM | POA: Diagnosis not present

## 2019-05-12 DIAGNOSIS — R0602 Shortness of breath: Secondary | ICD-10-CM | POA: Diagnosis not present

## 2019-05-14 ENCOUNTER — Ambulatory Visit (INDEPENDENT_AMBULATORY_CARE_PROVIDER_SITE_OTHER): Payer: PPO | Admitting: *Deleted

## 2019-05-14 DIAGNOSIS — I4891 Unspecified atrial fibrillation: Secondary | ICD-10-CM | POA: Diagnosis not present

## 2019-05-14 DIAGNOSIS — I428 Other cardiomyopathies: Secondary | ICD-10-CM

## 2019-05-15 LAB — CUP PACEART REMOTE DEVICE CHECK
Battery Remaining Longevity: 100 mo
Battery Remaining Percentage: 95.5 %
Battery Voltage: 2.99 V
Brady Statistic RV Percent Paced: 98 %
Date Time Interrogation Session: 20201118141156
Implantable Lead Implant Date: 20190104
Implantable Lead Implant Date: 20190104
Implantable Lead Location: 753858
Implantable Lead Location: 753860
Implantable Pulse Generator Implant Date: 20190104
Lead Channel Impedance Value: 1125 Ohm
Lead Channel Impedance Value: 480 Ohm
Lead Channel Pacing Threshold Amplitude: 1.625 V
Lead Channel Pacing Threshold Pulse Width: 0.5 ms
Lead Channel Sensing Intrinsic Amplitude: 12 mV
Lead Channel Setting Pacing Amplitude: 2.5 V
Lead Channel Setting Pacing Amplitude: 2.625
Lead Channel Setting Pacing Pulse Width: 0.5 ms
Lead Channel Setting Pacing Pulse Width: 0.5 ms
Lead Channel Setting Sensing Sensitivity: 4 mV
Pulse Gen Model: 3262
Pulse Gen Serial Number: 8931822

## 2019-05-26 ENCOUNTER — Ambulatory Visit: Payer: PPO | Admitting: Gastroenterology

## 2019-05-27 DIAGNOSIS — I130 Hypertensive heart and chronic kidney disease with heart failure and stage 1 through stage 4 chronic kidney disease, or unspecified chronic kidney disease: Secondary | ICD-10-CM | POA: Diagnosis not present

## 2019-05-27 DIAGNOSIS — E1121 Type 2 diabetes mellitus with diabetic nephropathy: Secondary | ICD-10-CM | POA: Diagnosis not present

## 2019-05-27 DIAGNOSIS — I4819 Other persistent atrial fibrillation: Secondary | ICD-10-CM | POA: Diagnosis not present

## 2019-05-27 DIAGNOSIS — N183 Chronic kidney disease, stage 3 unspecified: Secondary | ICD-10-CM | POA: Diagnosis not present

## 2019-05-27 DIAGNOSIS — M109 Gout, unspecified: Secondary | ICD-10-CM | POA: Diagnosis not present

## 2019-05-27 DIAGNOSIS — I5042 Chronic combined systolic (congestive) and diastolic (congestive) heart failure: Secondary | ICD-10-CM | POA: Diagnosis not present

## 2019-05-27 DIAGNOSIS — E782 Mixed hyperlipidemia: Secondary | ICD-10-CM | POA: Diagnosis not present

## 2019-05-27 DIAGNOSIS — E039 Hypothyroidism, unspecified: Secondary | ICD-10-CM | POA: Diagnosis not present

## 2019-05-27 DIAGNOSIS — N184 Chronic kidney disease, stage 4 (severe): Secondary | ICD-10-CM | POA: Diagnosis not present

## 2019-05-27 DIAGNOSIS — I10 Essential (primary) hypertension: Secondary | ICD-10-CM | POA: Diagnosis not present

## 2019-05-27 DIAGNOSIS — E1122 Type 2 diabetes mellitus with diabetic chronic kidney disease: Secondary | ICD-10-CM | POA: Diagnosis not present

## 2019-05-27 DIAGNOSIS — R000 Tachycardia, unspecified: Secondary | ICD-10-CM | POA: Diagnosis not present

## 2019-06-09 ENCOUNTER — Other Ambulatory Visit (HOSPITAL_COMMUNITY): Payer: Self-pay | Admitting: Internal Medicine

## 2019-06-09 DIAGNOSIS — Z1231 Encounter for screening mammogram for malignant neoplasm of breast: Secondary | ICD-10-CM

## 2019-06-12 ENCOUNTER — Other Ambulatory Visit: Payer: Self-pay

## 2019-06-12 ENCOUNTER — Ambulatory Visit (HOSPITAL_COMMUNITY)
Admission: RE | Admit: 2019-06-12 | Discharge: 2019-06-12 | Disposition: A | Discharge status: Home / Self Care | Payer: PPO | Source: Ambulatory Visit | Attending: Internal Medicine | Admitting: Internal Medicine

## 2019-06-12 DIAGNOSIS — Z1231 Encounter for screening mammogram for malignant neoplasm of breast: Secondary | ICD-10-CM | POA: Insufficient documentation

## 2019-06-12 NOTE — Progress Notes (Signed)
Remote pacemaker transmission.   

## 2019-06-26 ENCOUNTER — Other Ambulatory Visit: Payer: Self-pay

## 2019-06-26 ENCOUNTER — Ambulatory Visit: Payer: PPO | Attending: Internal Medicine

## 2019-06-26 DIAGNOSIS — Z20822 Contact with and (suspected) exposure to covid-19: Secondary | ICD-10-CM

## 2019-06-26 DIAGNOSIS — Z20828 Contact with and (suspected) exposure to other viral communicable diseases: Secondary | ICD-10-CM | POA: Diagnosis not present

## 2019-06-28 LAB — NOVEL CORONAVIRUS, NAA: SARS-CoV-2, NAA: DETECTED — AB

## 2019-06-29 ENCOUNTER — Telehealth: Payer: Self-pay | Admitting: Nurse Practitioner

## 2019-06-29 ENCOUNTER — Other Ambulatory Visit: Payer: Self-pay | Admitting: Nurse Practitioner

## 2019-06-29 DIAGNOSIS — J069 Acute upper respiratory infection, unspecified: Secondary | ICD-10-CM | POA: Insufficient documentation

## 2019-06-29 DIAGNOSIS — U071 COVID-19: Secondary | ICD-10-CM

## 2019-06-29 NOTE — Telephone Encounter (Signed)
  I connected by phone with [name] on 06/29/2019 at 1:00 PM to discuss the potential use of an new treatment for mild to moderate COVID-19 viral infection in non-hospitalized patients.  Symptoms started 06/25/19.  Tested positive 06/26/19, continues with chills and cough.  Will be at day 7 symptoms on Wednesday 07/22/2019.  Scheduled for 1430.  This patient is a 64 y.o. female that meets the FDA criteria for Emergency Use Authorization of bamlanivimab or casirivimab\imdevimab.  Has a (+) direct SARS-CoV-2 viral test result  Has mild or moderate COVID-19   Is ? 64 years of age and weighs ? 40 kg  Is NOT hospitalized due to COVID-19  Is NOT requiring oxygen therapy or requiring an increase in baseline oxygen flow rate due to COVID-19  Is within 10 days of symptom onset  Has at least one of the high risk factor(s) for progression to severe COVID-19 and/or hospitalization as defined in EUA.  Specific high risk criteria : Diabetes   I have spoken and communicated the following to the patient or parent/caregiver:  1. FDA has authorized the emergency use of bamlanivimab and casirivimab\imdevimab for the treatment of mild to moderate COVID-19 in adults and pediatric patients with positive results of direct SARS-CoV-2 viral testing who are 16 years of age and older weighing at least 40 kg, and who are at high risk for progressing to severe COVID-19 and/or hospitalization.  2. The significant known and potential risks and benefits of bamlanivimab and casirivimab\imdevimab, and the extent to which such potential risks and benefits are unknown.  3. Information on available alternative treatments and the risks and benefits of those alternatives, including clinical trials.  4. Patients treated with bamlanivimab and casirivimab\imdevimab should continue to self-isolate and use infection control measures (e.g., wear mask, isolate, social distance, avoid sharing personal items, clean and disinfect  "high touch" surfaces, and frequent handwashing) according to CDC guidelines.   5. The patient or parent/caregiver has the option to accept or refuse bamlanivimab or casirivimab\imdevimab .  After reviewing this information with the patient, The patient agreed to proceed with receiving the bamlanimivab infusion and will be provided a copy of the Fact sheet prior to receiving the infusion.[name] [name] 06/29/2019 1:00 PM

## 2019-06-29 NOTE — Progress Notes (Signed)
Bam orders for 07/01/2019

## 2019-06-30 ENCOUNTER — Inpatient Hospital Stay (HOSPITAL_COMMUNITY): Payer: PPO

## 2019-07-02 ENCOUNTER — Inpatient Hospital Stay (HOSPITAL_COMMUNITY)
Admission: EM | Admit: 2019-07-02 | Discharge: 2019-08-25 | DRG: 177 | Disposition: E | Payer: PPO | Attending: Internal Medicine | Admitting: Internal Medicine

## 2019-07-02 ENCOUNTER — Ambulatory Visit (HOSPITAL_COMMUNITY): Payer: PPO | Admitting: Hematology

## 2019-07-02 ENCOUNTER — Encounter (HOSPITAL_COMMUNITY): Payer: Self-pay | Admitting: Emergency Medicine

## 2019-07-02 ENCOUNTER — Emergency Department (HOSPITAL_COMMUNITY): Payer: PPO

## 2019-07-02 ENCOUNTER — Other Ambulatory Visit: Payer: Self-pay

## 2019-07-02 ENCOUNTER — Ambulatory Visit (HOSPITAL_COMMUNITY): Payer: PPO

## 2019-07-02 DIAGNOSIS — J1282 Pneumonia due to coronavirus disease 2019: Secondary | ICD-10-CM | POA: Diagnosis not present

## 2019-07-02 DIAGNOSIS — U071 COVID-19: Principal | ICD-10-CM | POA: Diagnosis present

## 2019-07-02 DIAGNOSIS — A4150 Gram-negative sepsis, unspecified: Secondary | ICD-10-CM | POA: Diagnosis not present

## 2019-07-02 DIAGNOSIS — M419 Scoliosis, unspecified: Secondary | ICD-10-CM | POA: Diagnosis present

## 2019-07-02 DIAGNOSIS — G9341 Metabolic encephalopathy: Secondary | ICD-10-CM | POA: Diagnosis not present

## 2019-07-02 DIAGNOSIS — E870 Hyperosmolality and hypernatremia: Secondary | ICD-10-CM | POA: Diagnosis not present

## 2019-07-02 DIAGNOSIS — N179 Acute kidney failure, unspecified: Secondary | ICD-10-CM

## 2019-07-02 DIAGNOSIS — Z79890 Hormone replacement therapy (postmenopausal): Secondary | ICD-10-CM

## 2019-07-02 DIAGNOSIS — Z515 Encounter for palliative care: Secondary | ICD-10-CM | POA: Diagnosis not present

## 2019-07-02 DIAGNOSIS — J189 Pneumonia, unspecified organism: Secondary | ICD-10-CM

## 2019-07-02 DIAGNOSIS — R0602 Shortness of breath: Secondary | ICD-10-CM

## 2019-07-02 DIAGNOSIS — Z7901 Long term (current) use of anticoagulants: Secondary | ICD-10-CM

## 2019-07-02 DIAGNOSIS — I4819 Other persistent atrial fibrillation: Secondary | ICD-10-CM | POA: Diagnosis not present

## 2019-07-02 DIAGNOSIS — E860 Dehydration: Secondary | ICD-10-CM | POA: Diagnosis present

## 2019-07-02 DIAGNOSIS — Z452 Encounter for adjustment and management of vascular access device: Secondary | ICD-10-CM | POA: Diagnosis not present

## 2019-07-02 DIAGNOSIS — M109 Gout, unspecified: Secondary | ICD-10-CM | POA: Diagnosis not present

## 2019-07-02 DIAGNOSIS — R6521 Severe sepsis with septic shock: Secondary | ICD-10-CM | POA: Diagnosis not present

## 2019-07-02 DIAGNOSIS — J9601 Acute respiratory failure with hypoxia: Secondary | ICD-10-CM | POA: Diagnosis present

## 2019-07-02 DIAGNOSIS — E1122 Type 2 diabetes mellitus with diabetic chronic kidney disease: Secondary | ICD-10-CM | POA: Diagnosis present

## 2019-07-02 DIAGNOSIS — J9600 Acute respiratory failure, unspecified whether with hypoxia or hypercapnia: Secondary | ICD-10-CM

## 2019-07-02 DIAGNOSIS — E1165 Type 2 diabetes mellitus with hyperglycemia: Secondary | ICD-10-CM | POA: Diagnosis not present

## 2019-07-02 DIAGNOSIS — R0902 Hypoxemia: Secondary | ICD-10-CM

## 2019-07-02 DIAGNOSIS — E11649 Type 2 diabetes mellitus with hypoglycemia without coma: Secondary | ICD-10-CM | POA: Diagnosis not present

## 2019-07-02 DIAGNOSIS — Z66 Do not resuscitate: Secondary | ICD-10-CM | POA: Diagnosis not present

## 2019-07-02 DIAGNOSIS — J069 Acute upper respiratory infection, unspecified: Secondary | ICD-10-CM | POA: Diagnosis not present

## 2019-07-02 DIAGNOSIS — A419 Sepsis, unspecified organism: Secondary | ICD-10-CM

## 2019-07-02 DIAGNOSIS — E039 Hypothyroidism, unspecified: Secondary | ICD-10-CM | POA: Diagnosis not present

## 2019-07-02 DIAGNOSIS — J159 Unspecified bacterial pneumonia: Secondary | ICD-10-CM | POA: Diagnosis not present

## 2019-07-02 DIAGNOSIS — K5790 Diverticulosis of intestine, part unspecified, without perforation or abscess without bleeding: Secondary | ICD-10-CM | POA: Diagnosis not present

## 2019-07-02 DIAGNOSIS — I2510 Atherosclerotic heart disease of native coronary artery without angina pectoris: Secondary | ICD-10-CM | POA: Diagnosis present

## 2019-07-02 DIAGNOSIS — G4733 Obstructive sleep apnea (adult) (pediatric): Secondary | ICD-10-CM | POA: Diagnosis present

## 2019-07-02 DIAGNOSIS — N1831 Chronic kidney disease, stage 3a: Secondary | ICD-10-CM | POA: Diagnosis present

## 2019-07-02 DIAGNOSIS — E213 Hyperparathyroidism, unspecified: Secondary | ICD-10-CM | POA: Diagnosis present

## 2019-07-02 DIAGNOSIS — I428 Other cardiomyopathies: Secondary | ICD-10-CM | POA: Diagnosis not present

## 2019-07-02 DIAGNOSIS — Z974 Presence of external hearing-aid: Secondary | ICD-10-CM

## 2019-07-02 DIAGNOSIS — J45909 Unspecified asthma, uncomplicated: Secondary | ICD-10-CM | POA: Diagnosis present

## 2019-07-02 DIAGNOSIS — I130 Hypertensive heart and chronic kidney disease with heart failure and stage 1 through stage 4 chronic kidney disease, or unspecified chronic kidney disease: Secondary | ICD-10-CM | POA: Diagnosis present

## 2019-07-02 DIAGNOSIS — J1289 Other viral pneumonia: Secondary | ICD-10-CM | POA: Diagnosis not present

## 2019-07-02 DIAGNOSIS — E871 Hypo-osmolality and hyponatremia: Secondary | ICD-10-CM | POA: Diagnosis not present

## 2019-07-02 DIAGNOSIS — N184 Chronic kidney disease, stage 4 (severe): Secondary | ICD-10-CM | POA: Diagnosis not present

## 2019-07-02 DIAGNOSIS — K219 Gastro-esophageal reflux disease without esophagitis: Secondary | ICD-10-CM | POA: Diagnosis not present

## 2019-07-02 DIAGNOSIS — I4811 Longstanding persistent atrial fibrillation: Secondary | ICD-10-CM | POA: Diagnosis not present

## 2019-07-02 DIAGNOSIS — H9190 Unspecified hearing loss, unspecified ear: Secondary | ICD-10-CM | POA: Diagnosis present

## 2019-07-02 DIAGNOSIS — R918 Other nonspecific abnormal finding of lung field: Secondary | ICD-10-CM | POA: Diagnosis not present

## 2019-07-02 DIAGNOSIS — Z95810 Presence of automatic (implantable) cardiac defibrillator: Secondary | ICD-10-CM

## 2019-07-02 DIAGNOSIS — N390 Urinary tract infection, site not specified: Secondary | ICD-10-CM | POA: Diagnosis not present

## 2019-07-02 DIAGNOSIS — I4891 Unspecified atrial fibrillation: Secondary | ICD-10-CM | POA: Diagnosis not present

## 2019-07-02 DIAGNOSIS — E1151 Type 2 diabetes mellitus with diabetic peripheral angiopathy without gangrene: Secondary | ICD-10-CM | POA: Diagnosis present

## 2019-07-02 DIAGNOSIS — T380X5A Adverse effect of glucocorticoids and synthetic analogues, initial encounter: Secondary | ICD-10-CM | POA: Diagnosis not present

## 2019-07-02 DIAGNOSIS — E1142 Type 2 diabetes mellitus with diabetic polyneuropathy: Secondary | ICD-10-CM | POA: Diagnosis not present

## 2019-07-02 DIAGNOSIS — J849 Interstitial pulmonary disease, unspecified: Secondary | ICD-10-CM | POA: Diagnosis not present

## 2019-07-02 DIAGNOSIS — E119 Type 2 diabetes mellitus without complications: Secondary | ICD-10-CM

## 2019-07-02 DIAGNOSIS — I5042 Chronic combined systolic (congestive) and diastolic (congestive) heart failure: Secondary | ICD-10-CM | POA: Diagnosis not present

## 2019-07-02 DIAGNOSIS — E785 Hyperlipidemia, unspecified: Secondary | ICD-10-CM | POA: Diagnosis present

## 2019-07-02 DIAGNOSIS — K5730 Diverticulosis of large intestine without perforation or abscess without bleeding: Secondary | ICD-10-CM | POA: Diagnosis present

## 2019-07-02 DIAGNOSIS — I10 Essential (primary) hypertension: Secondary | ICD-10-CM | POA: Diagnosis not present

## 2019-07-02 DIAGNOSIS — R6510 Systemic inflammatory response syndrome (SIRS) of non-infectious origin without acute organ dysfunction: Secondary | ICD-10-CM

## 2019-07-02 DIAGNOSIS — N183 Chronic kidney disease, stage 3 unspecified: Secondary | ICD-10-CM | POA: Diagnosis present

## 2019-07-02 DIAGNOSIS — Z823 Family history of stroke: Secondary | ICD-10-CM

## 2019-07-02 DIAGNOSIS — R0600 Dyspnea, unspecified: Secondary | ICD-10-CM

## 2019-07-02 DIAGNOSIS — Z801 Family history of malignant neoplasm of trachea, bronchus and lung: Secondary | ICD-10-CM

## 2019-07-02 DIAGNOSIS — E782 Mixed hyperlipidemia: Secondary | ICD-10-CM | POA: Diagnosis not present

## 2019-07-02 DIAGNOSIS — R0603 Acute respiratory distress: Secondary | ICD-10-CM

## 2019-07-02 DIAGNOSIS — E875 Hyperkalemia: Secondary | ICD-10-CM | POA: Diagnosis not present

## 2019-07-02 DIAGNOSIS — I9589 Other hypotension: Secondary | ICD-10-CM | POA: Diagnosis not present

## 2019-07-02 DIAGNOSIS — Z886 Allergy status to analgesic agent status: Secondary | ICD-10-CM

## 2019-07-02 DIAGNOSIS — Z8349 Family history of other endocrine, nutritional and metabolic diseases: Secondary | ICD-10-CM

## 2019-07-02 DIAGNOSIS — Z8774 Personal history of (corrected) congenital malformations of heart and circulatory system: Secondary | ICD-10-CM

## 2019-07-02 DIAGNOSIS — I252 Old myocardial infarction: Secondary | ICD-10-CM

## 2019-07-02 DIAGNOSIS — Z7951 Long term (current) use of inhaled steroids: Secondary | ICD-10-CM

## 2019-07-02 DIAGNOSIS — R000 Tachycardia, unspecified: Secondary | ICD-10-CM | POA: Diagnosis not present

## 2019-07-02 DIAGNOSIS — R7881 Bacteremia: Secondary | ICD-10-CM | POA: Diagnosis not present

## 2019-07-02 DIAGNOSIS — Z8249 Family history of ischemic heart disease and other diseases of the circulatory system: Secondary | ICD-10-CM

## 2019-07-02 DIAGNOSIS — J90 Pleural effusion, not elsewhere classified: Secondary | ICD-10-CM | POA: Diagnosis not present

## 2019-07-02 DIAGNOSIS — Z880 Allergy status to penicillin: Secondary | ICD-10-CM

## 2019-07-02 DIAGNOSIS — E1121 Type 2 diabetes mellitus with diabetic nephropathy: Secondary | ICD-10-CM | POA: Diagnosis not present

## 2019-07-02 DIAGNOSIS — Z888 Allergy status to other drugs, medicaments and biological substances status: Secondary | ICD-10-CM

## 2019-07-02 LAB — PROCALCITONIN: Procalcitonin: 6.29 ng/mL

## 2019-07-02 LAB — LACTIC ACID, PLASMA
Lactic Acid, Venous: 2.8 mmol/L (ref 0.5–1.9)
Lactic Acid, Venous: 2.1 mmol/L (ref 0.5–1.9)

## 2019-07-02 LAB — CBC WITH DIFFERENTIAL/PLATELET
Abs Immature Granulocytes: 0.15 10*3/uL — ABNORMAL HIGH (ref 0.00–0.07)
Basophils Absolute: 0 10*3/uL (ref 0.0–0.1)
Basophils Relative: 0 %
Eosinophils Absolute: 0 10*3/uL (ref 0.0–0.5)
Eosinophils Relative: 0 %
HCT: 45 % (ref 36.0–46.0)
Hemoglobin: 14.4 g/dL (ref 12.0–15.0)
Immature Granulocytes: 2 %
Lymphocytes Relative: 8 %
Lymphs Abs: 0.6 10*3/uL — ABNORMAL LOW (ref 0.7–4.0)
MCH: 31.2 pg (ref 26.0–34.0)
MCHC: 32 g/dL (ref 30.0–36.0)
MCV: 97.4 fL (ref 80.0–100.0)
Monocytes Absolute: 0.3 10*3/uL (ref 0.1–1.0)
Monocytes Relative: 4 %
Neutro Abs: 6.2 10*3/uL (ref 1.7–7.7)
Neutrophils Relative %: 86 %
Platelets: 267 10*3/uL (ref 150–400)
RBC: 4.62 MIL/uL (ref 3.87–5.11)
RDW: 13.5 % (ref 11.5–15.5)
WBC: 7.3 10*3/uL (ref 4.0–10.5)
nRBC: 0.3 % — ABNORMAL HIGH (ref 0.0–0.2)

## 2019-07-02 LAB — COMPREHENSIVE METABOLIC PANEL
ALT: 25 U/L (ref 0–44)
AST: 69 U/L — ABNORMAL HIGH (ref 15–41)
Albumin: 3 g/dL — ABNORMAL LOW (ref 3.5–5.0)
Alkaline Phosphatase: 104 U/L (ref 38–126)
Anion gap: 15 (ref 5–15)
BUN: 52 mg/dL — ABNORMAL HIGH (ref 8–23)
CO2: 16 mmol/L — ABNORMAL LOW (ref 22–32)
Calcium: 8.5 mg/dL — ABNORMAL LOW (ref 8.9–10.3)
Chloride: 105 mmol/L (ref 98–111)
Creatinine, Ser: 2.43 mg/dL — ABNORMAL HIGH (ref 0.44–1.00)
GFR calc Af Amer: 24 mL/min — ABNORMAL LOW (ref >60)
GFR calc non Af Amer: 20 mL/min — ABNORMAL LOW (ref >60)
Glucose, Bld: 146 mg/dL — ABNORMAL HIGH (ref 70–99)
Potassium: 3.6 mmol/L (ref 3.5–5.1)
Sodium: 136 mmol/L (ref 135–145)
Total Bilirubin: 1 mg/dL (ref 0.3–1.2)
Total Protein: 7.2 g/dL (ref 6.5–8.1)

## 2019-07-02 LAB — FIBRINOGEN: Fibrinogen: 715 mg/dL — ABNORMAL HIGH (ref 210–475)

## 2019-07-02 LAB — C-REACTIVE PROTEIN: CRP: 25.9 mg/dL — ABNORMAL HIGH (ref <1.0)

## 2019-07-02 LAB — BRAIN NATRIURETIC PEPTIDE: B Natriuretic Peptide: 1321 pg/mL — ABNORMAL HIGH (ref 0.0–100.0)

## 2019-07-02 LAB — CBG MONITORING, ED: Glucose-Capillary: 133 mg/dL — ABNORMAL HIGH (ref 70–99)

## 2019-07-02 LAB — FERRITIN: Ferritin: 3926 ng/mL — ABNORMAL HIGH (ref 11–307)

## 2019-07-02 LAB — LACTATE DEHYDROGENASE: LDH: 662 U/L — ABNORMAL HIGH (ref 98–192)

## 2019-07-02 LAB — TRIGLYCERIDES: Triglycerides: 127 mg/dL (ref <150)

## 2019-07-02 LAB — D-DIMER, QUANTITATIVE: D-Dimer, Quant: 4.12 ug/mL-FEU — ABNORMAL HIGH (ref 0.00–0.50)

## 2019-07-02 MED ORDER — SODIUM CHLORIDE 0.9 % IV SOLN
100.0000 mg | Freq: Every day | INTRAVENOUS | Status: DC
  Filled 2019-07-02: qty 20

## 2019-07-02 MED ORDER — METOPROLOL SUCCINATE ER 25 MG PO TB24
50.0000 mg | ORAL_TABLET | Freq: Every day | ORAL | Status: DC
  Administered 2019-07-03 – 2019-07-22 (×20): 50 mg via ORAL
  Filled 2019-07-02 (×6): qty 2
  Filled 2019-07-02: qty 1
  Filled 2019-07-02 (×13): qty 2

## 2019-07-02 MED ORDER — VANCOMYCIN HCL 1500 MG/300ML IV SOLN
1500.0000 mg | Freq: Once | INTRAVENOUS | Status: AC
  Administered 2019-07-02: 19:00:00 1500 mg via INTRAVENOUS
  Filled 2019-07-02: qty 300

## 2019-07-02 MED ORDER — ALBUTEROL SULFATE HFA 108 (90 BASE) MCG/ACT IN AERS
2.0000 | INHALATION_SPRAY | Freq: Four times a day (QID) | RESPIRATORY_TRACT | Status: DC | PRN
  Administered 2019-07-08 – 2019-07-23 (×7): 2 via RESPIRATORY_TRACT
  Filled 2019-07-02: qty 6.7

## 2019-07-02 MED ORDER — VANCOMYCIN HCL IN DEXTROSE 1-5 GM/200ML-% IV SOLN: 1000.0000 mg | INTRAVENOUS | Status: DC

## 2019-07-02 MED ORDER — SODIUM CHLORIDE 0.9 % IV SOLN
200.0000 mg | Freq: Once | INTRAVENOUS | Status: AC
  Administered 2019-07-02: 19:00:00 200 mg via INTRAVENOUS
  Filled 2019-07-02: qty 40

## 2019-07-02 MED ORDER — HYDROCOD POLST-CPM POLST ER 10-8 MG/5ML PO SUER
5.0000 mL | Freq: Two times a day (BID) | ORAL | Status: DC | PRN
  Administered 2019-07-13 – 2019-07-24 (×4): 5 mL via ORAL
  Filled 2019-07-02 (×6): qty 5

## 2019-07-02 MED ORDER — ZINC SULFATE 220 (50 ZN) MG PO CAPS
220.0000 mg | ORAL_CAPSULE | Freq: Every day | ORAL | Status: DC
  Administered 2019-07-03 – 2019-07-24 (×22): 220 mg via ORAL
  Filled 2019-07-02 (×24): qty 1

## 2019-07-02 MED ORDER — INSULIN ASPART 100 UNIT/ML ~~LOC~~ SOLN
0.0000 [IU] | Freq: Three times a day (TID) | SUBCUTANEOUS | Status: DC
  Administered 2019-07-03 (×3): 2 [IU] via SUBCUTANEOUS
  Administered 2019-07-04: 08:00:00 5 [IU] via SUBCUTANEOUS
  Administered 2019-07-04: 8 [IU] via SUBCUTANEOUS
  Administered 2019-07-04: 12:00:00 5 [IU] via SUBCUTANEOUS
  Administered 2019-07-05 (×2): 11 [IU] via SUBCUTANEOUS

## 2019-07-02 MED ORDER — SODIUM CHLORIDE 0.9 % IV SOLN
1.0000 g | INTRAVENOUS | Status: DC
  Filled 2019-07-02: qty 1

## 2019-07-02 MED ORDER — GUAIFENESIN-DM 100-10 MG/5ML PO SYRP
10.0000 mL | ORAL_SOLUTION | ORAL | Status: DC | PRN
  Administered 2019-07-06 – 2019-07-23 (×3): 10 mL via ORAL
  Filled 2019-07-02 (×3): qty 10

## 2019-07-02 MED ORDER — PRAVASTATIN SODIUM 10 MG PO TABS
10.0000 mg | ORAL_TABLET | Freq: Every day | ORAL | Status: DC
  Administered 2019-07-03 – 2019-07-24 (×22): 10 mg via ORAL
  Filled 2019-07-02 (×27): qty 1

## 2019-07-02 MED ORDER — ASCORBIC ACID 500 MG PO TABS
500.0000 mg | ORAL_TABLET | Freq: Every day | ORAL | Status: DC
  Administered 2019-07-03 – 2019-07-26 (×23): 500 mg via ORAL
  Filled 2019-07-02 (×25): qty 1

## 2019-07-02 MED ORDER — DEXAMETHASONE SODIUM PHOSPHATE 10 MG/ML IJ SOLN
10.0000 mg | INTRAMUSCULAR | Status: DC
  Administered 2019-07-03 – 2019-07-07 (×6): 10 mg via INTRAVENOUS
  Filled 2019-07-02 (×6): qty 1

## 2019-07-02 MED ORDER — SODIUM CHLORIDE 0.9 % IV SOLN
200.0000 mg | Freq: Once | INTRAVENOUS | Status: DC
  Filled 2019-07-02: qty 40

## 2019-07-02 MED ORDER — VANCOMYCIN HCL IN DEXTROSE 1-5 GM/200ML-% IV SOLN: 1000.0000 mg | Freq: Once | INTRAVENOUS | Status: DC

## 2019-07-02 MED ORDER — SODIUM CHLORIDE 0.9 % IV SOLN
2.0000 g | Freq: Once | INTRAVENOUS | Status: AC
  Administered 2019-07-02: 2 g via INTRAVENOUS
  Filled 2019-07-02: qty 2

## 2019-07-02 MED ORDER — SODIUM CHLORIDE 0.9 % IV SOLN
100.0000 mg | Freq: Every day | INTRAVENOUS | Status: AC
  Administered 2019-07-03 – 2019-07-06 (×4): 100 mg via INTRAVENOUS
  Filled 2019-07-02 (×5): qty 20

## 2019-07-02 MED ORDER — DEXAMETHASONE SODIUM PHOSPHATE 10 MG/ML IJ SOLN
10.0000 mg | Freq: Once | INTRAMUSCULAR | Status: AC
  Administered 2019-07-02: 19:00:00 10 mg via INTRAVENOUS
  Filled 2019-07-02: qty 1

## 2019-07-02 MED ORDER — ACETAMINOPHEN 325 MG PO TABS
650.0000 mg | ORAL_TABLET | Freq: Four times a day (QID) | ORAL | Status: DC | PRN
  Administered 2019-07-02: 16:00:00 650 mg via ORAL
  Filled 2019-07-02: qty 2

## 2019-07-02 MED ORDER — PANTOPRAZOLE SODIUM 40 MG PO TBEC
40.0000 mg | DELAYED_RELEASE_TABLET | Freq: Two times a day (BID) | ORAL | Status: DC
  Administered 2019-07-03 – 2019-07-22 (×40): 40 mg via ORAL
  Filled 2019-07-02 (×40): qty 1

## 2019-07-02 MED ORDER — SENNOSIDES-DOCUSATE SODIUM 8.6-50 MG PO TABS
1.0000 | ORAL_TABLET | Freq: Every evening | ORAL | Status: DC | PRN
  Administered 2019-07-17: 1 via ORAL
  Filled 2019-07-02: qty 1

## 2019-07-02 MED ORDER — ACETAMINOPHEN 325 MG PO TABS
650.0000 mg | ORAL_TABLET | Freq: Four times a day (QID) | ORAL | Status: DC | PRN
  Administered 2019-07-03 – 2019-07-24 (×5): 650 mg via ORAL
  Filled 2019-07-02 (×6): qty 2

## 2019-07-02 MED ORDER — FLUTICASONE FUROATE-VILANTEROL 100-25 MCG/INH IN AEPB
2.0000 | INHALATION_SPRAY | Freq: Every day | RESPIRATORY_TRACT | Status: DC
  Administered 2019-07-03 – 2019-07-24 (×22): 2 via RESPIRATORY_TRACT
  Filled 2019-07-02 (×3): qty 28

## 2019-07-02 MED ORDER — RIVAROXABAN 15 MG PO TABS
15.0000 mg | ORAL_TABLET | Freq: Every day | ORAL | Status: DC
  Administered 2019-07-03 – 2019-07-23 (×21): 15 mg via ORAL
  Filled 2019-07-02 (×23): qty 1

## 2019-07-02 MED ORDER — LEVOTHYROXINE SODIUM 100 MCG PO TABS
100.0000 ug | ORAL_TABLET | Freq: Every day | ORAL | Status: DC
  Administered 2019-07-03 – 2019-07-25 (×23): 100 ug via ORAL
  Filled 2019-07-02: qty 1
  Filled 2019-07-02 (×2): qty 2
  Filled 2019-07-02: qty 1
  Filled 2019-07-02: qty 2
  Filled 2019-07-02: qty 1
  Filled 2019-07-02 (×8): qty 2
  Filled 2019-07-02: qty 1
  Filled 2019-07-02 (×2): qty 2
  Filled 2019-07-02: qty 1
  Filled 2019-07-02 (×4): qty 2
  Filled 2019-07-02: qty 1
  Filled 2019-07-02 (×3): qty 2

## 2019-07-02 NOTE — ED Notes (Signed)
Pt's O2 sats are above 90, but patient looks more lethargic. Have notified Evalee Jefferson PA, who will notify [place], [place].

## 2019-07-02 NOTE — Progress Notes (Signed)
Pharmacy Antibiotic Note  [name] is a 64 y.o. female admitted on 06/30/2019 with pneumonia.  Pharmacy has been consulted for Vancomycin and Cefepime dosing.  Plan: Vancomycin 1500 mg IV x 1 dose Vancomycin 1000 mg IV every 48 hours.  Goal trough 15-20 mcg/mL.  Cefepime 1000 mg IV every 24 hours. Monitor labs, c/s, and vanco level as indicated  Height: 5' (152.4 cm) Weight: 145 lb 1 oz (65.8 kg) IBW/kg (Calculated) : 45.5  Temp (24hrs), Avg:100.9 F (38.3 C), Min:98.6 F (37 C), Max:103.2 F (39.6 C)  Recent Labs  Lab 07/11/2019 1454 07/01/2019 1634  WBC 7.3  --   CREATININE 2.43*  --   LATICACIDVEN 2.8* 2.1*    Estimated Creatinine Clearance: 20.1 mL/min (A) (by C-G formula based on SCr of 2.43 mg/dL (H)).    Allergies  Allergen Reactions  . Flecainide Nausea Only and Other (See Comments)    Faint feeling  . Hydrocodone-Acetaminophen Nausea Only and Other (See Comments)    Severe headache  . Ibuprofen Other (See Comments)    Kidney dysfunction  . Oxycodone Hcl Nausea Only and Other (See Comments)    Headache  . Penicillins Nausea Only and Other (See Comments)    Severe headache Has patient had a PCN reaction causing immediate rash, facial/tongue/throat swelling, SOB or lightheadedness with hypotension: No Has patient had a PCN reaction causing severe rash involving mucus membranes or skin necrosis: No Has patient had a PCN reaction that required hospitalization: No Has patient had a PCN reaction occurring within the last 10 years: No If all of the above answers are "NO", then may proceed with Cephalosporin use.     Antimicrobials this admission: Vanco 1/6 >>  Cefepime 1/6 >>  Remdesivir 1/6  Dose adjustments this admission: Vanco/Cefepime  Microbiology results: 1/6 BCx: pending COVID (+)  Thank you for allowing pharmacy to be a part of this patient's care.  Ramond Craver 07/10/2019 5:27 PM

## 2019-07-02 NOTE — ED Triage Notes (Signed)
Pt states that she is having increased sob with covid. She is very [facility]

## 2019-07-02 NOTE — ED Provider Notes (Addendum)
[facility] EMERGENCY DEPARTMENT Provider Note   CSN: [id number] Arrival date & time: 07/01/2019  1352     History Chief Complaint  Patient presents with  . Shortness of Breath  . covid +    [name] is a 64 y.o. female extensive past medical history including amiodarone toxicity, interstitial lung disease, chronic systolic heart failure, history of congenital heart disease, type 2 diabetes, IDA, hypertension, hyperparathyroidism, peripheral vascular disease, renal insufficiency, persistent A. fib anticoagulated on Xarelto who presents emergency department with chief complaint of shortness of breath.  Patient states that she is not normally on oxygen.  She was diagnosed with Covid on 4 January and started having her symptoms on the third.  She feels markedly short of breath.  She states that it feels different from previous episodes of CHF exacerbation.  She denies any increased swelling in her abdomen or legs more than usual.  She has not had any weight gain.  She denies vomiting, diarrhea.  She is unsure if she has had fever.  HPI     Past Medical History:  Diagnosis Date  . Amiodarone toxicity   . Anemia   . Chronic bronchitis   . Chronic systolic CHF (congestive heart failure) ([place])    a. 04/2017 Echo: EF 25%.  . Congenital heart disease   . Diabetes mellitus without complication ([place])    Type II  . Diverticulosis   . Fall due to stumbling October 08, 2013   Due to shoes  . GERD (gastroesophageal reflux disease)   . Gout   . Hemorrhoids   . Hyperlipidemia   . Hyperparathyroidism   . Hypertension   . Hypothyroidism   . IDA (iron deficiency anemia)   . Microscopic colitis 9/11   Colonoscopy  . NICM (nonischemic cardiomyopathy) - tachycardia induced in the setting of AFib ([place])    a. 04/2017 Echo: EF 25%, basal-mid inferolateral, inf AK, triv AI, mild MR, sev dil LA, mod red RV fxn, mildly dil RA, mild to mod TR, PASP 51mmHg.  . Non-obstructive CAD (coronary artery  disease)    a. 04/2017 Cath: LM 20p, LCX nl, RCA 20p/m.  Marland Kitchen Paroxysmal SVT (supraventricular tachycardia) ([place])   . Persistent atrial fibrillation ([place]) 06/2013   a. 04/2017 Admit with CHF/NICM/AFib -> CHA2DS2VASc = 4-->Xarelto 15 mg daily started; b. 06/2017 s/p BiV PPM (SJM ser # [id number]) and AVN ablation.  . Presence of permanent cardiac pacemaker 06/29/2017   Biventricular pacemaker placed  . PVD (peripheral vascular disease) ([place]) 09/2004   Right common femoral endarterectomy in April of 2007  . Renal insufficiency   . Schatzki's ring    Last EGD with esophageal dilatation 71F  9/11  . Scoliosis   . Varicose veins right leg pain and swelling    Patient Active Problem List   Diagnosis Date Noted  . AKI (acute kidney injury) ([place]) 07/20/2019  . SIRS (systemic inflammatory response syndrome) ([place]) 07/19/2019  . Acute respiratory disease due to COVID-19 virus 06/29/2019  . Asthma 03/11/2019  . Atrial fibrillation with rapid ventricular response ([place]) 06/29/2017  . Acute bronchitis 05/24/2017  . CAD (coronary artery disease) 05/17/2017  . Elevated troponin   . Acute systolic heart failure ([place])   . Non-ST elevation (NSTEMI) myocardial infarction ([place])   . Non-ischemic cardiomyopathy ([place])   . Acute on chronic combined systolic and diastolic CHF (congestive heart failure) ([place]) 05/10/2017  . Persistent atrial fibrillation ([place]) 05/09/2017  . LLQ pain 01/04/2017  . ILD (interstitial lung  disease) ([place]) 11/21/2016  . Hemorrhoids 08/21/2016  . IDA (iron deficiency anemia) 01/11/2016  . Diverticulosis of colon without hemorrhage   . Abdominal pain, right upper quadrant 10/29/2015  . Rectal bleeding 10/29/2015  . Normocytic anemia 10/29/2015  . Dysphagia   . Abnormality of esophagus 04/29/2015  . Esophageal dysphagia 03/26/2015  . OSA (obstructive sleep apnea) 12/31/2013  . Atrial fibrillation with RVR ([place]) 11/09/2013  . CKD (chronic kidney disease), stage III 11/09/2013  .  Hyperkalemia 11/07/2013  . Diastolic dysfunction [id number]  . DM (diabetes mellitus) ([place]) 11/07/2013  . Varicose veins of lower extremities with ulcer ([place]) 10/17/2013  . Cold-Left Hand / Foot 10/08/2013  . Circulation problem-Left foot 10/08/2013  . Atherosclerosis of native arteries of the extremities with ulceration(440.23) 10/08/2013  . A-fib ([place]) 08/05/2013  . Constipation 04/11/2013  . Peripheral vascular disease, unspecified ([place]) 10/27/2011  . Varicose veins of bilateral lower extremities with other complications 0000000  . COLLAGENOUS COLITIS 04/08/2010  . GERD 02/18/2010  . HYPERTHYROIDISM 02/11/2010    Past Surgical History:  Procedure Laterality Date  . ASD REPAIR  Age 67   [facility]  . AV NODE ABLATION N/A 06/29/2017   Procedure: AV NODE ABLATION;  Surgeon: Evans Lance, MD;  Location: [facility];  Service: Cardiovascular;  Laterality: N/A;  . BIOPSY  02/13/2017   Procedure: BIOPSY;  Surgeon: Daneil Dolin, MD;  Location[facility];  Service: Endoscopy;;  duodenum gastric  . BIV PACEMAKER INSERTION CRT-P N/A 06/29/2017   Procedure: BIV PACEMAKER INSERTION CRT-P;  Surgeon: Evans Lance, MD;  Location: [place] [facility];  Service: Cardiovascular;  Laterality: N/A;  . CARDIOVERSION N/A 11/13/2013   Procedure: CARDIOVERSION;  Surgeon: Herminio Commons, MD;  Location: [facility] ORS;  Service: Endoscopy;  Laterality: N/A;  . COLONOSCOPY  03/04/2010   anal canal hemorrhoids otherwise normal. TI normal. Bx showed lymphocytic colitis. next TCS 02/2020  . COLONOSCOPY  April 2008   Rehman: Pancolonic diverticulosis, external hemorrhoids  . COLONOSCOPY N/A 11/30/2015   RMR: Normal terminal ileum for 10 cm, nonbleeding grade 1 internal hemorrhoids, colonic diverticulosis. Next colonoscopy in 2027.  Marland Kitchen COLONOSCOPY WITH PROPOFOL N/A 02/24/2019   Procedure: COLONOSCOPY WITH PROPOFOL;  Surgeon: Daneil Dolin, MD;  [facility];   Service: Endoscopy;  Laterality: N/A;  11:15am  . ELAS  06-01-11   Right saphenous ELAS   . ESOPHAGEAL DILATION N/A 05/28/2015   Procedure: ESOPHAGEAL DILATION;  Surgeon: Daneil Dolin, MD;  [facility];  Service: Endoscopy;  Laterality: N/A;  . ESOPHAGOGASTRODUODENOSCOPY  03/04/2010   noncritical appearing Schatzki ring. SB bx negative  . ESOPHAGOGASTRODUODENOSCOPY  05/2008   erosive reflux esophagatitis, noncritical Schatzki ring  . ESOPHAGOGASTRODUODENOSCOPY N/A 05/28/2015   [id number] esophagus somewhat baggy, likely due to underlying esophageal motility disorder/small HH   . ESOPHAGOGASTRODUODENOSCOPY N/A 02/13/2017   Dr. Gala Romney: subtly abnormal stomach of doubtful clinical significant s/p biopsy, normal duodenum s/p biopsy, fundic gland polyp on path, normal duodenal biopsy  . ESOPHAGOGASTRODUODENOSCOPY (EGD) WITH PROPOFOL N/A 02/24/2019   Procedure: ESOPHAGOGASTRODUODENOSCOPY (EGD) WITH PROPOFOL;  Surgeon: Daneil Dolin, MD;  [facility];  Service: Endoscopy;  Laterality: N/A;  . [place] SURGERY  2010  . Left hemithyroidectomy    . Left parathyroidectomy  2007  . MALONEY DILATION N/A 02/24/2019   Procedure: Venia Minks DILATION;  Surgeon: Daneil Dolin, MD;  [facility];  Service: Endoscopy;  Laterality: N/A;  .  Parathyroid adenoma  2007  . Right common femoral endarterectomy  2007  . RIGHT/LEFT HEART CATH AND CORONARY ANGIOGRAPHY N/A 05/15/2017   Procedure: RIGHT/LEFT HEART CATH AND CORONARY ANGIOGRAPHY;  Surgeon: Troy Sine, MD;  Location: [facility];  Service: Cardiovascular;  Laterality: N/A;  . Small bowel capsule  12 2009   Mid to distal small bowel with edema, erosions, tiny ulceration felt to be NSAID related  . TONSILLECTOMY       OB History    Gravida  0   Para  0   Term  0   Preterm  0   AB  0   Living  0     SAB  0   TAB  0   Ectopic  0   Multiple  0   Live Births  0           Family History    Problem Relation Age of Onset  . Stroke Mother   . Heart disease Mother        CHF  - Amputation-Right Leg  . Hyperlipidemia Mother   . Hypertension Mother   . Congestive Heart Failure Mother   . Lung cancer Father   . Cancer Father        Lung  . Other Brother        back problems  . Heart attack Paternal Grandmother   . Cancer Maternal Grandmother        uterine  . Colon cancer Neg Hx   . Colon polyps Neg Hx     Social History   Tobacco Use  . Smoking status: Never Smoker  . Smokeless tobacco: Never Used  . Tobacco comment: Never smoked  Substance Use Topics  . Alcohol use: No    Alcohol/week: 0.0 standard drinks  . Drug use: No    Home Medications Prior to Admission medications   Medication Sig Start Date End Date Taking? Authorizing Provider  acetaminophen (TYLENOL) 500 MG tablet Take 1,000 mg by mouth every 6 (six) hours as needed (for pain.).    Yes [provider]  BREO ELLIPTA 100-25 MCG/INH AEPB Inhale 2 puffs into the lungs daily. 07/10/18  Yes [provider]  cyclobenzaprine (FLEXERIL) 5 MG tablet Take 5 mg by mouth 3 (three) times daily as needed for muscle spasms.  11/28/18  Yes [provider]  furosemide (LASIX) 40 MG tablet Take 1 tablet (40 mg total) by mouth daily. May take extra 1 daily as needed Patient taking differently: Take 40 mg by mouth daily.  03/21/19  Yes Parrett, Tammy S, NP  gabapentin (NEURONTIN) 100 MG capsule Take 100 mg by mouth 2 (two) times daily.  04/20/17  Yes [provider]  hydrALAZINE (APRESOLINE) 25 MG tablet Take 0.5 tablets (12.5 mg total) by mouth every 8 (eight) hours. 05/17/17  Yes Theora Gianotti, NP  isosorbide mononitrate (IMDUR) 30 MG 24 hr tablet Take 0.5 tablets (15 mg total) by mouth daily. 05/18/17  Yes Theora Gianotti, NP  levothyroxine (SYNTHROID) 100 MCG tablet Take 100 mcg by mouth daily before breakfast.  12/02/18  Yes [provider]  loperamide  (IMODIUM) 2 MG capsule Take 2-4 mg by mouth 4 (four) times daily as needed for diarrhea or loose stools.   Yes [provider]  metoprolol succinate (TOPROL-XL) 50 MG 24 hr tablet TAKE 1 TABLET BY MOUTH ONCE DAILY. Patient taking differently: Take 50 mg by mouth daily.  07/08/18  Yes Cristopher Peru  W, MD  pantoprazole (PROTONIX) 40 MG tablet Take 40 mg by mouth 2 (two) times daily.    Yes [provider]  polyethylene glycol (MIRALAX / GLYCOLAX) packet Take 17 g by mouth daily as needed (for constipation.).    Yes [provider]  potassium chloride (K-DUR) 10 MEQ tablet Take 1 tablet (10 mEq total) by mouth every other day. Patient taking differently: Take 10 mEq by mouth daily.  06/30/17  Yes Theora Gianotti, NP  pravastatin (PRAVACHOL) 10 MG tablet Take 10 mg by mouth daily.   Yes [provider]  PROAIR HFA 108 (90 Base) MCG/ACT inhaler INHALE 2 PUFFS EVERY 6 HOURS AS NEEDED FOR SHORTNESS OF BREATH AND WHEEZING. Patient taking differently: Inhale 2 puffs into the lungs every 6 (six) hours as needed for wheezing or shortness of breath.  07/19/18  Yes Rigoberto Noel, MD  Probiotic Product (ALIGN PO) Take 1 tablet by mouth daily.    Yes [provider]  sitaGLIPtin (JANUVIA) 50 MG tablet Take 50 mg by mouth every morning.    Yes [provider]  temazepam (RESTORIL) 15 MG capsule Take 15 mg by mouth at bedtime as needed for sleep.  02/22/17  Yes [provider]  tiZANidine (ZANAFLEX) 2 MG tablet Take 2 mg by mouth every 8 (eight) hours as needed.  01/22/19  Yes [provider]  traMADol (ULTRAM) 50 MG tablet Take 50 mg by mouth every 6 (six) hours as needed for moderate pain or severe pain.  02/06/19  Yes [provider]  XARELTO 15 MG TABS tablet TAKE 1 TABLET BY MOUTH ONCE A DAY. Patient taking differently: Take 15 mg by mouth at bedtime.  08/30/18  Yes Evans Lance, MD    Allergies    Flecainide,  Hydrocodone-acetaminophen, Ibuprofen, Oxycodone hcl, and Penicillins  Review of Systems   Review of Systems Ten systems reviewed and are negative for acute change, except as noted in the HPI.   Physical Exam Updated Vital Signs BP 114/80   Pulse 71   Temp (!) 103.2 F (39.6 C) (Rectal)   Resp (!) 27   Ht 5' (1.524 m)   Wt 65.8 kg   SpO2 100%   BMI 28.33 kg/m   Physical Exam Vitals and nursing note reviewed.  Constitutional:      General: She is not in acute distress.    Appearance: She is well-developed. She is not diaphoretic.  HENT:     Head: Normocephalic and atraumatic.  Eyes:     General: No scleral icterus.    Conjunctiva/sclera: Conjunctivae normal.  Cardiovascular:     Rate and Rhythm: Normal rate and regular rhythm.     Heart sounds: Normal heart sounds. No murmur. No friction rub. No gallop.   Pulmonary:     Effort: Pulmonary effort is normal. No respiratory distress.     Breath sounds: Normal breath sounds.  Abdominal:     General: Bowel sounds are normal. There is no distension.     Palpations: Abdomen is soft. There is no mass.     Tenderness: There is no abdominal tenderness. There is no guarding.  Musculoskeletal:     Cervical back: Normal range of motion.     Right lower leg: Edema present.     Left lower leg: Edema (1+ edema BL LE) present.  Skin:    General: Skin is warm and dry.  Neurological:     Mental Status: She is alert and oriented to  person, place, and time.  Psychiatric:        Behavior: Behavior normal.     ED Results / Procedures / Treatments   Labs (all labs ordered are listed, but only abnormal results are displayed) Labs Reviewed  LACTIC ACID, PLASMA - Abnormal; Notable for the following components:      Result Value   Lactic Acid, Venous 2.8 (*)    All other components within normal limits  LACTIC ACID, PLASMA - Abnormal; Notable for the following components:   Lactic Acid, Venous 2.1 (*)    All other components within  normal limits  CBC WITH DIFFERENTIAL/PLATELET - Abnormal; Notable for the following components:   nRBC 0.3 (*)    Lymphs Abs 0.6 (*)    Abs Immature Granulocytes 0.15 (*)    All other components within normal limits  COMPREHENSIVE METABOLIC PANEL - Abnormal; Notable for the following components:   CO2 16 (*)    Glucose, Bld 146 (*)    BUN 52 (*)    Creatinine, Ser 2.43 (*)    Calcium 8.5 (*)    Albumin 3.0 (*)    AST 69 (*)    GFR calc non Af Amer 20 (*)    GFR calc Af Amer 24 (*)    All other components within normal limits  D-DIMER, QUANTITATIVE (NOT AT [facility] - Abnormal; Notable for the following components:   D-Dimer, Quant 4.12 (*)    All other components within normal limits  LACTATE DEHYDROGENASE - Abnormal; Notable for the following components:   LDH 662 (*)    All other components within normal limits  FERRITIN - Abnormal; Notable for the following components:   Ferritin 3,926 (*)    All other components within normal limits  FIBRINOGEN - Abnormal; Notable for the following components:   Fibrinogen 715 (*)    All other components within normal limits  C-REACTIVE PROTEIN - Abnormal; Notable for the following components:   CRP 25.9 (*)    All other components within normal limits  BRAIN NATRIURETIC PEPTIDE - Abnormal; Notable for the following components:   B Natriuretic Peptide 1,321.0 (*)    All other components within normal limits  CBG MONITORING, ED - Abnormal; Notable for the following components:   Glucose-Capillary 133 (*)    All other components within normal limits  CULTURE, BLOOD (ROUTINE X 2)  CULTURE, BLOOD (ROUTINE X 2)  PROCALCITONIN  TRIGLYCERIDES  URINALYSIS, ROUTINE W REFLEX MICROSCOPIC    EKG EKG Interpretation  Date/Time:  Wednesday July 02 2019 14:52:34 EST Ventricular Rate:  74 PR Interval:    QRS Duration: 124 QT Interval:  413 QTC Calculation: 459 R Axis:   148 Text Interpretation: Suspect V-pacing similar to prior 11/28/2017  likely   underlying Atrial Fibrillation Confirmed by Daleen Bo [phone]) on 07/27/2019 3:53:44 PM   Radiology DG Chest Port 1 View  Result Date: 07/05/2019 CLINICAL DATA:  Hypoxia. EXAM: PORTABLE CHEST 1 VIEW COMPARISON:  None. FINDINGS: There is a dual lead AICD. Moderate severity diffuse bilateral infiltrates are seen. There is a small right pleural effusion. No pneumothorax is identified. The cardiac silhouette is markedly enlarged. Multiple radiopaque surgical clips are seen overlying the superior mediastinum. There is marked severity levoscoliosis of the thoracic spine with moderate severity multilevel degenerative changes. IMPRESSION: 1. Moderate severity diffuse bilateral infiltrates with a small right pleural effusion. 2. Marked enlargement of the cardiac silhouette. 3. Dual lead AICD in place. Electronically Signed   By: Basha Gross.D.  On: 06/30/2019 15:50    Procedures .Critical Care Performed by: Margarita Mail, PA-C Authorized by: Margarita Mail, PA-C   Critical care provider statement:    Critical care time (minutes):  52   Critical care time was exclusive of:  Separately billable procedures and treating other patients   Critical care was necessary to treat or prevent imminent or life-threatening deterioration of the following conditions:  Respiratory failure   Critical care was time spent personally by me on the following activities:  Discussions with consultants, evaluation of patient's response to treatment, examination of patient, ordering and performing treatments and interventions, ordering and review of laboratory studies, ordering and review of radiographic studies, pulse oximetry, re-evaluation of patient's condition, obtaining history from patient or surrogate and review of old charts   (including critical care time)  Medications Ordered in ED Medications  acetaminophen (TYLENOL) tablet 650 mg (650 mg Oral Given by Other 07/27/2019 1621)  remdesivir 200 mg in  sodium chloride 0.9% 250 mL IVPB (200 mg Intravenous New Bag/Given 07/22/2019 1919)    Followed by  remdesivir 100 mg in sodium chloride 0.9 % 100 mL IVPB (has no administration in time range)  ceFEPIme (MAXIPIME) 1 g in sodium chloride 0.9 % 100 mL IVPB (has no administration in time range)  vancomycin (VANCOCIN) IVPB 1000 mg/200 mL premix (has no administration in time range)  dexamethasone (DECADRON) injection 10 mg (10 mg Intravenous Given 07/20/2019 1840)  ceFEPIme (MAXIPIME) 2 g in sodium chloride 0.9 % 100 mL IVPB (0 g Intravenous Stopped 07/16/2019 1918)  vancomycin (VANCOREADY) IVPB 1500 mg/300 mL (1,500 mg Intravenous New Bag/Given 07/26/2019 1836)    ED Course  I have reviewed the triage vital signs and the nursing notes.  Pertinent labs & imaging results that were available during my care of the patient were reviewed by me and considered in my medical decision making (see chart for details).  Clinical Course as of Jul 02 2107  Wed Jul 02, 2019  1552 B Natriuretic Peptide(!): 1,321.0 [AH]    Clinical Course User Index [AH] Margarita Mail, PA-C   MDM Rules/Calculators/A&P                      CC:sob, COVID 19+ VS:  Vitals:   06/28/2019 1544 07/26/2019 1600 07/08/2019 1656 07/15/2019 1830  BP:  122/67  114/80  Pulse:  67 74 71  Resp:  (!) 29 (!) 33 (!) 27  Temp: (!) 103.2 F (39.6 C)     TempSrc: Rectal     SpO2:  91% 100% 100%  Weight:      Height:        [id number] is gathered by patient and EMR. DDX:The emergent differential diagnosis for shortness of breath includes, but is not limited to, Pulmonary edema, bronchoconstriction, Pneumonia, Pulmonary embolism, Pneumotherax/ Hemothorax, Dysrythmia, ACS.  Labs: I reviewed the labs which show elevated lactic acid.  Elevated BNP.  Elevated inflammatory markers, no leukocytosis. Imaging: I personally reviewed the images (CXR) which show(s) diffuse patchy infiltrates and BL pleural effusions on my interpretation EKG: Paced rhythm at a rate  of 74 MDM: Patient here with acute hypoxic respiratory failure due to COVID-19 infection.  Question multifactorial cause including pulmonary edema.  She does not appear to be volume overloaded.  She does appear to be dehydrated with dry oral mucosa.  Febrile to 103.  Patient is requiring 6 L of oxygen to maintain oxygen saturations. Patient disposition: Admit Patient condition: Serious.  Final Clinical Impression(s) / ED Diagnoses Final diagnoses:  Acute respiratory failure due to COVID-19 [facility])    Rx / [place] Orders ED Discharge Orders    None       Margarita Mail, PA-C 07/22/2019 2116    Margarita Mail, PA-C 07/22/2019 2117    Daleen Bo, MD 07/03/19 1719

## 2019-07-02 NOTE — H&P (Signed)
TRH H&P   Patient Demographics:    [name], is a 64 y.o. female  MRN: [id number]   DOB - Mar 11, 1956  Admit Date - 07/10/2019  Outpatient Primary MD for the patient is Celene Squibb, MD  Referring MD/NP/PA: Lawanda Cousins   Patient coming from: Home  Chief Complaint  Patient presents with  . Shortness of Breath  . covid +      HPI:    [name]  is a 64 y.o. female, sensitive past medical history including amiodarone toxicity, interstitial lung disease, chronic systolic/diastolic heart failure, history of congenital heart disease, type 2 diabetes mellitus, IDA, hypertension,hyperparathyroidism, peripheral vascular disease, renal insufficiency, persistent A. fib anticoagulated on Xarelto patient presents to ED secondary to shortness of breath, patient was recently diagnosed 12/31 with COVID-19, at baseline she has no oxygen requirement, patient presents to ED secondary to shortness of breath, report her symptoms started on the third, with progressive dyspnea, reports fever, chills, cough, neurolyse body ache, denies nausea, vomiting, diarrhea, chest pain, leg edema, or orthopnea. - in ED was febrile 103, hypoxic per ED physician in the mid 80s, requiring initially 6 L nasal cannula, currently on 2 L nasal cannula upon my evaluation, as well her chest x-ray significant for diffuse bilateral opacities, I was requested to admit for COVID-19 for pneumonia    Review of systems:    In addition to the HPI above, Patient reports fever and chills No Headache, No changes with Vision or hearing, No problems swallowing food or Liquids, No Chest pain, reports cough and shortness of breath No Abdominal pain, No Nausea or Vommitting, Bowel movements are regular, No Blood in stool or Urine, No dysuria, No new skin rashes or bruises, No new joints pains-aches,  No new weakness, tingling,  numbness in any extremity, No recent weight gain or loss, No polyuria, polydypsia or polyphagia, No significant Mental Stressors.  A full 10 point Review of Systems was done, except as stated above, all other Review of Systems were negative.   With Past History of the following :    Past Medical History:  Diagnosis Date  . Amiodarone toxicity   . Anemia   . Chronic bronchitis   . Chronic systolic CHF (congestive heart failure) ([place])    a. 04/2017 Echo: EF 25%.  . Congenital heart disease   . Diabetes mellitus without complication ([place])    Type II  . Diverticulosis   . Fall due to stumbling October 08, 2013   Due to shoes  . GERD (gastroesophageal reflux disease)   . Gout   . Hemorrhoids   . Hyperlipidemia   . Hyperparathyroidism   . Hypertension   . Hypothyroidism   . IDA (iron deficiency anemia)   . Microscopic colitis 9/11   Colonoscopy  . NICM (nonischemic cardiomyopathy) - tachycardia induced in the setting of AFib ([place])    a. 04/2017  Echo: EF 25%, basal-mid inferolateral, inf AK, triv AI, mild MR, sev dil LA, mod red RV fxn, mildly dil RA, mild to mod TR, PASP 63mmHg.  . Non-obstructive CAD (coronary artery disease)    a. 04/2017 Cath: LM 20p, LCX nl, RCA 20p/m.  Marland Kitchen Paroxysmal SVT (supraventricular tachycardia) ([place])   . Persistent atrial fibrillation ([place]) 06/2013   a. 04/2017 Admit with CHF/NICM/AFib -> CHA2DS2VASc = 4-->Xarelto 15 mg daily started; b. 06/2017 s/p BiV PPM (SJM ser # [id number]) and AVN ablation.  . Presence of permanent cardiac pacemaker 06/29/2017   Biventricular pacemaker placed  . PVD (peripheral vascular disease) ([place]) 09/2004   Right common femoral endarterectomy in April of 2007  . Renal insufficiency   . Schatzki's ring    Last EGD with esophageal dilatation 65F  9/11  . Scoliosis   . Varicose veins right leg pain and swelling      Past Surgical History:  Procedure Laterality Date  . ASD REPAIR  Age 79   [facility]  . AV NODE ABLATION N/A 06/29/2017   Procedure: AV NODE ABLATION;  Surgeon: Evans Lance, MD;  Location: [place] [facility];  Service: Cardiovascular;  Laterality: N/A;  . BIOPSY  02/13/2017   Procedure: BIOPSY;  Surgeon: Daneil Dolin, MD;  [facility];  Service: Endoscopy;;  duodenum gastric  . BIV PACEMAKER INSERTION CRT-P N/A 06/29/2017   Procedure: BIV PACEMAKER INSERTION CRT-P;  Surgeon: Evans Lance, MD;  Location: [place] [facility];  Service: Cardiovascular;  Laterality: N/A;  . CARDIOVERSION N/A 11/13/2013   Procedure: CARDIOVERSION;  Surgeon: Herminio Commons, MD;  Location: [facility];  Service: Endoscopy;  Laterality: N/A;  . COLONOSCOPY  03/04/2010   anal canal hemorrhoids otherwise normal. TI normal. Bx showed lymphocytic colitis. next TCS 02/2020  . COLONOSCOPY  April 2008   Rehman: Pancolonic diverticulosis, external hemorrhoids  . COLONOSCOPY N/A 11/30/2015   RMR: Normal terminal ileum for 10 cm, nonbleeding grade 1 internal hemorrhoids, colonic diverticulosis. Next colonoscopy in 2027.  Marland Kitchen COLONOSCOPY WITH PROPOFOL N/A 02/24/2019   Procedure: COLONOSCOPY WITH PROPOFOL;  Surgeon: Daneil Dolin, MD;  [facility];  Service: Endoscopy;  Laterality: N/A;  11:15am  . ELAS  06-01-11   Right saphenous ELAS   . ESOPHAGEAL DILATION N/A 05/28/2015   Procedure: ESOPHAGEAL DILATION;  Surgeon: Daneil Dolin, MD;  [facility];  Service: Endoscopy;  Laterality: N/A;  . ESOPHAGOGASTRODUODENOSCOPY  03/04/2010   noncritical appearing Schatzki ring. SB bx negative  . ESOPHAGOGASTRODUODENOSCOPY  05/2008   erosive reflux esophagatitis, noncritical Schatzki ring  . ESOPHAGOGASTRODUODENOSCOPY N/A 05/28/2015   [id number] esophagus somewhat baggy, likely due to underlying esophageal motility disorder/small HH   . ESOPHAGOGASTRODUODENOSCOPY N/A 02/13/2017   Dr. Gala Romney: subtly abnormal stomach of doubtful clinical significant s/p biopsy, normal duodenum  s/p biopsy, fundic gland polyp on path, normal duodenal biopsy  . ESOPHAGOGASTRODUODENOSCOPY (EGD) WITH PROPOFOL N/A 02/24/2019   Procedure: ESOPHAGOGASTRODUODENOSCOPY (EGD) WITH PROPOFOL;  Surgeon: Daneil Dolin, MD;  [facility];  Service: Endoscopy;  Laterality: N/A;  . [place] SURGERY  2010  . Left hemithyroidectomy    . Left parathyroidectomy  2007  . MALONEY DILATION N/A 02/24/2019   Procedure: Venia Minks DILATION;  Surgeon: Daneil Dolin, MD;  [facility];  Service: Endoscopy;  Laterality: N/A;  . Parathyroid adenoma  2007  . Right common femoral endarterectomy  2007  . RIGHT/LEFT HEART CATH  AND CORONARY ANGIOGRAPHY N/A 05/15/2017   Procedure: RIGHT/LEFT HEART CATH AND CORONARY ANGIOGRAPHY;  Surgeon: Troy Sine, MD;  Location: [place] [facility];  Service: Cardiovascular;  Laterality: N/A;  . Small bowel capsule  12 2009   Mid to distal small bowel with edema, erosions, tiny ulceration felt to be NSAID related  . TONSILLECTOMY        Social History:     Social History   Tobacco Use  . Smoking status: Never Smoker  . Smokeless tobacco: Never Used  . Tobacco comment: Never smoked  Substance Use Topics  . Alcohol use: No    Alcohol/week: 0.0 standard drinks       Family History :     Family History  Problem Relation Age of Onset  . Stroke Mother   . Heart disease Mother        CHF  - Amputation-Right Leg  . Hyperlipidemia Mother   . Hypertension Mother   . Congestive Heart Failure Mother   . Lung cancer Father   . Cancer Father        Lung  . Other Brother        back problems  . Heart attack Paternal Grandmother   . Cancer Maternal Grandmother        uterine  . Colon cancer Neg Hx   . Colon polyps Neg Hx      Home Medications:   Prior to Admission medications   Medication Sig Start Date End Date Taking? Authorizing Provider  acetaminophen (TYLENOL) 500 MG tablet Take 1,000 mg by mouth every 6 (six) hours as needed (for  pain.).     [provider]  BREO ELLIPTA 100-25 MCG/INH AEPB Inhale 2 puffs into the lungs daily. 07/10/18   [provider]  cyclobenzaprine (FLEXERIL) 5 MG tablet Take 5 mg by mouth 3 (three) times daily as needed for muscle spasms.  11/28/18   [provider]  ferrous sulfate (FEOSOL) 325 (65 FE) MG tablet Take 325 mg by mouth daily with breakfast.     [provider]  furosemide (LASIX) 40 MG tablet Take 1 tablet (40 mg total) by mouth daily. May take extra 1 daily as needed 03/21/19   Parrett, Fonnie Mu, NP  gabapentin (NEURONTIN) 100 MG capsule Take 100 mg by mouth 2 (two) times daily.  04/20/17   [provider]  GAVILYTE-N WITH FLAVOR PACK 420 g solution  02/20/19   [provider]  hydrALAZINE (APRESOLINE) 25 MG tablet Take 0.5 tablets (12.5 mg total) by mouth every 8 (eight) hours. 05/17/17   Theora Gianotti, NP  isosorbide mononitrate (IMDUR) 30 MG 24 hr tablet Take 0.5 tablets (15 mg total) by mouth daily. 05/18/17   Theora Gianotti, NP  Lancets (ONETOUCH DELICA PLUS [id number]) [place]  01/02/19   [provider]  levothyroxine (SYNTHROID) 100 MCG tablet Take 100 mcg by mouth daily before breakfast.  12/02/18   [provider]  loperamide (IMODIUM) 2 MG capsule Take 2-4 mg by mouth 4 (four) times daily as needed for diarrhea or loose stools.    [provider]  metoprolol succinate (TOPROL-XL) 50 MG 24 hr tablet TAKE 1 TABLET BY MOUTH ONCE DAILY. 07/08/18   Evans Lance, MD  Multiple Vitamin (MULTIVITAMIN) tablet Take 1 tablet by mouth daily.    [provider]  [facility] ULTRA test strip  01/02/19   [provider]  pantoprazole (PROTONIX) 40 MG tablet Take 40 mg by mouth  2 (two) times daily.     [provider]  polyethylene glycol (MIRALAX / GLYCOLAX) packet Take 17 g by mouth daily as needed (for constipation.).     [provider]  potassium chloride (K-DUR) 10  MEQ tablet Take 1 tablet (10 mEq total) by mouth every other day. Patient taking differently: Take 10 mEq by mouth daily.  06/30/17   Theora Gianotti, NP  pravastatin (PRAVACHOL) 10 MG tablet Take 10 mg by mouth daily.    [provider]  PROAIR HFA 108 (90 Base) MCG/ACT inhaler INHALE 2 PUFFS EVERY 6 HOURS AS NEEDED FOR SHORTNESS OF BREATH AND WHEEZING. Patient not taking: No sig reported 07/19/18   Rigoberto Noel, MD  Probiotic Product (ALIGN PO) Take 1 tablet by mouth daily.     [provider]  sitaGLIPtin (JANUVIA) 50 MG tablet Take 50 mg by mouth every morning.     [provider]  temazepam (RESTORIL) 15 MG capsule Take 15 mg by mouth at bedtime as needed for sleep.  02/22/17   [provider]  tiZANidine (ZANAFLEX) 2 MG tablet Take 2 mg by mouth every 8 (eight) hours as needed.  01/22/19   [provider]  traMADol (ULTRAM) 50 MG tablet TAKE 1 TABLET BY MOUTH EVERY SIX HOURS AS NEEDED FOR SEVERE PAIN 02/06/19   [provider]  XARELTO 15 MG TABS tablet TAKE 1 TABLET BY MOUTH ONCE A DAY. Patient taking differently: Take 15 mg by mouth at bedtime.  08/30/18   Evans Lance, MD     Allergies:     Allergies  Allergen Reactions  . Flecainide Nausea Only and Other (See Comments)    Faint feeling  . Hydrocodone-Acetaminophen Nausea Only and Other (See Comments)    Severe headache  . Ibuprofen Other (See Comments)    Kidney dysfunction  . Oxycodone Hcl Nausea Only and Other (See Comments)    Headache  . Penicillins Nausea Only and Other (See Comments)    Severe headache Has patient had a PCN reaction causing immediate rash, facial/tongue/throat swelling, SOB or lightheadedness with hypotension: No Has patient had a PCN reaction causing severe rash involving mucus membranes or skin necrosis: No Has patient had a PCN reaction that required hospitalization: No Has patient had a PCN reaction occurring within the last 10 years:  No If all of the above answers are "NO", then may proceed with Cephalosporin use.      Physical Exam:   Vitals  Blood pressure 122/67, pulse 74, temperature (!) 103.2 F (39.6 C), temperature source Rectal, resp. rate (!) 33, height 5' (1.524 m), weight 65.8 kg, SpO2 100 %.   1. General frail female, laying in bed, in no apparent distress  2. Normal affect and insight, Not Suicidal or Homicidal, Awake Alert, Oriented X 3.  3. No F.N deficits, ALL C.Nerves Intact, Strength 5/5 all 4 extremities, Sensation intact all 4 extremities, Plantars down going.  4. Ears and Eyes appear Normal, Conjunctivae clear, PERRLA.  SHEENT was significantly dry oral mucosa 5. Supple Neck, No JVD, No cervical lymphadenopathy appriciated, No Carotid Bruits.  6. Symmetrical Chest wall movement, Good air movement bilaterally, CTAB.  7. RRR, No Gallops, Rubs or Murmurs, No Parasternal Heave.  8. Positive Bowel Sounds, Abdomen Soft, No tenderness, No organomegaly appriciated,No rebound -guarding or rigidity.  9.  No Cyanosis, delayed skin Turgor, No Skin Rash or Bruise.  10. Good muscle tone,  joints appear normal , no effusions, Normal ROM.  11. No Palpable Lymph Nodes in Neck or Axillae    Data Review:    CBC Recent Labs  Lab 07/25/2019 1454  WBC 7.3  HGB 14.4  HCT 45.0  PLT 267  MCV 97.4  MCH 31.2  MCHC 32.0  RDW 13.5  LYMPHSABS 0.6*  MONOABS 0.3  EOSABS 0.0  BASOSABS 0.0   ------------------------------------------------------------------------------------------------------------------  Chemistries  Recent Labs  Lab 07/11/2019 1454  NA 136  K 3.6  CL 105  CO2 16*  GLUCOSE 146*  BUN 52*  CREATININE 2.43*  CALCIUM 8.5*  AST 69*  ALT 25  ALKPHOS 104  BILITOT 1.0   ------------------------------------------------------------------------------------------------------------------ estimated creatinine clearance is 20.1 mL/min (A) (by C-G formula based on SCr of 2.43 mg/dL  (H)). ------------------------------------------------------------------------------------------------------------------ No results for input(s): TSH, T4TOTAL, T3FREE, THYROIDAB in the last 72 hours.  Invalid input(s): FREET3  Coagulation profile No results for input(s): INR, PROTIME in the last 168 hours. ------------------------------------------------------------------------------------------------------------------- Recent Labs    07/10/2019 1454  DDIMER 4.12*   -------------------------------------------------------------------------------------------------------------------  Cardiac Enzymes No results for input(s): CKMB, TROPONINI, MYOGLOBIN in the last 168 hours.  Invalid input(s): CK ------------------------------------------------------------------------------------------------------------------    Component Value Date/Time   BNP 1,321.0 (H) 07/27/2019 1455     ---------------------------------------------------------------------------------------------------------------  Urinalysis    Component Value Date/Time   COLORURINE YELLOW 01/05/2017 [place] 01/05/2017 1445   LABSPEC 1.010 01/05/2017 1445   PHURINE 8.0 01/05/2017 1445   GLUCOSEU NEGATIVE 01/05/2017 1445   HGBUR NEGATIVE 01/05/2017 1445   BILIRUBINUR NEGATIVE 01/05/2017 1445   KETONESUR NEGATIVE 01/05/2017 1445   PROTEINUR 30 (A) 01/05/2017 1445   UROBILINOGEN 0.2 11/06/2013 2335   NITRITE NEGATIVE 01/05/2017 1445   LEUKOCYTESUR NEGATIVE 01/05/2017 1445    ----------------------------------------------------------------------------------------------------------------   Imaging Results:    DG Chest Port 1 View  Result Date: 07/23/2019 CLINICAL DATA:  Hypoxia. EXAM: PORTABLE CHEST 1 VIEW COMPARISON:  None. FINDINGS: There is a dual lead AICD. Moderate severity diffuse bilateral infiltrates are seen. There is a small right pleural effusion. No pneumothorax is identified. The cardiac  silhouette is markedly enlarged. Multiple radiopaque surgical clips are seen overlying the superior mediastinum. There is marked severity levoscoliosis of the thoracic spine with moderate severity multilevel degenerative changes. IMPRESSION: 1. Moderate severity diffuse bilateral infiltrates with a small right pleural effusion. 2. Marked enlargement of the cardiac silhouette. 3. Dual lead AICD in place. Electronically Signed   By: Virgina Norfolk M.D.   On: 07/24/2019 15:50    Assessment & Plan:    Active Problems:   A-fib (HCC)   DM (diabetes mellitus) ([place])   CKD (chronic kidney disease), stage III   ILD (interstitial lung disease) (HCC)   Non-ischemic cardiomyopathy (HCC)   CAD (coronary artery disease)   Atrial fibrillation with rapid ventricular response (HCC)   Acute respiratory disease due to COVID-19 virus   AKI (acute kidney injury) ([place])   SIRS (systemic inflammatory response syndrome) ([place])  Acute hypoxic respiratory failure due to COVID-19 pneumonia -Patient presents with hypoxia, dyspnea, new oxygen requirement, chest x-ray significant for bilateral opacities. -IV steroids. -Start on IV remdesivir. -Not a candidate for Actemra given significantly elevated procalcitonin at 6.9. -Continue to trend inflammatory markers closely. -Giving SIRS on presentation and elevated procalcitonin, will start broad-spectrum antibiotic vancomycin and cefepime, continue to trend procalcitonin, follow on septic work-up including blood cultures, UA. Marland Kitchen She was encouraged to use incentive spirometry, flutter valve, out of bed to chair with staff, if hypoxia worsens will need proning. COVID-19 Labs  Recent Labs    07/08/2019 1454  DDIMER 4.12*  LDH 662*    Lab Results  Component Value Date   SARSCOV2NAA Detected (A) 06/26/2019   Melbeta NEGATIVE 02/20/2019   [place] Not Detected 12/18/2018   History of A. fib with RVR -Continue with Xarelto for anticoagulation. -Continue with  metoprolol. -She developed amiodarone toxicity, status post AVN ablation and permanent pacemaker implantation  Chronic diastolic/systolic CHF. -Even though her BNP is elevated, she does appear to be on the dry side, will hold on diuresis, also I will hold on giving IV fluids given he has COVID-19 for pneumonia, would like her to be on the dry side. -Continue with metoprolol, but will hold Imdur and hydralazine till she is more stable  Diabetes mellitus -Hold Januvia, keep on insulin sliding scale  AKI on CKD -Hold nephrotoxic medication, hold Lasix, encouraged fluid intake, if no improvement will give gentle hydration.    DVT Prophylaxis on xarelto  AM Labs Ordered, also please review Full Orders  Family Communication: Admission, patients condition and plan of care including tests being ordered have been discussed with the patient who indicate understanding and agree with the plan and Code Status.  Code Status Full  Likely DC to  Home  Condition GUARDED    Consults called: None  Admission status: inpatient  Time spent in minutes : 65 minutes   Phillips Climes M.D on 07/01/2019 at 5:32 PM  Between 7am to 7pm - Pager - [phone]. After 7pm go to [email or web address] - password [email or web address]  [facility] - Office  [phone]

## 2019-07-02 NOTE — ED Notes (Signed)
CRITICAL VALUE ALERT  Critical Value:  Lactic 2.8  Date & Time Notied:  1532, 06/29/2019  Provider Notified: A. Harris, [place]  Orders Received/Actions taken: no new orders

## 2019-07-02 NOTE — ED Notes (Signed)
Date and time results received: 07/13/2019 1702 (use smartphrase ".now" to insert current time)  Test: lacticacid Critical Value: 2.1  Name of Provider Notified: [email or web address],pa  Orders Received? Or Actions Taken?:

## 2019-07-02 NOTE — ED Notes (Signed)
Respiratory coming to do high flow [place]

## 2019-07-02 NOTE — ED Notes (Signed)
Placed patient on 6lpm HFNC. SPO2 when monitor reading was 99%. RR high 30s.

## 2019-07-03 ENCOUNTER — Inpatient Hospital Stay (HOSPITAL_COMMUNITY): Payer: PPO

## 2019-07-03 DIAGNOSIS — J069 Acute upper respiratory infection, unspecified: Secondary | ICD-10-CM

## 2019-07-03 DIAGNOSIS — U071 COVID-19: Principal | ICD-10-CM

## 2019-07-03 LAB — URINALYSIS, ROUTINE W REFLEX MICROSCOPIC
Bilirubin Urine: NEGATIVE
Glucose, UA: NEGATIVE mg/dL
Ketones, ur: NEGATIVE mg/dL
Nitrite: NEGATIVE
Protein, ur: 100 mg/dL — AB
Specific Gravity, Urine: 1.02 (ref 1.005–1.030)
pH: 5 (ref 5.0–8.0)

## 2019-07-03 LAB — CBC WITH DIFFERENTIAL/PLATELET
Abs Immature Granulocytes: 0.07 10*3/uL (ref 0.00–0.07)
Basophils Absolute: 0 10*3/uL (ref 0.0–0.1)
Basophils Relative: 0 %
Eosinophils Absolute: 0 10*3/uL (ref 0.0–0.5)
Eosinophils Relative: 0 %
HCT: 47 % — ABNORMAL HIGH (ref 36.0–46.0)
Hemoglobin: 14.4 g/dL (ref 12.0–15.0)
Immature Granulocytes: 2 %
Lymphocytes Relative: 11 %
Lymphs Abs: 0.5 10*3/uL — ABNORMAL LOW (ref 0.7–4.0)
MCH: 30.6 pg (ref 26.0–34.0)
MCHC: 30.6 g/dL (ref 30.0–36.0)
MCV: 99.8 fL (ref 80.0–100.0)
Monocytes Absolute: 0.1 10*3/uL (ref 0.1–1.0)
Monocytes Relative: 2 %
Neutro Abs: 3.5 10*3/uL (ref 1.7–7.7)
Neutrophils Relative %: 85 %
Platelets: 236 10*3/uL (ref 150–400)
RBC: 4.71 MIL/uL (ref 3.87–5.11)
RDW: 13.3 % (ref 11.5–15.5)
WBC: 4.1 10*3/uL (ref 4.0–10.5)
nRBC: 0 % (ref 0.0–0.2)

## 2019-07-03 LAB — COMPREHENSIVE METABOLIC PANEL
ALT: 23 U/L (ref 0–44)
AST: 56 U/L — ABNORMAL HIGH (ref 15–41)
Albumin: 2.5 g/dL — ABNORMAL LOW (ref 3.5–5.0)
Alkaline Phosphatase: 89 U/L (ref 38–126)
Anion gap: 14 (ref 5–15)
BUN: 59 mg/dL — ABNORMAL HIGH (ref 8–23)
CO2: 18 mmol/L — ABNORMAL LOW (ref 22–32)
Calcium: 8.3 mg/dL — ABNORMAL LOW (ref 8.9–10.3)
Chloride: 108 mmol/L (ref 98–111)
Creatinine, Ser: 2.29 mg/dL — ABNORMAL HIGH (ref 0.44–1.00)
GFR calc Af Amer: 25 mL/min — ABNORMAL LOW (ref >60)
GFR calc non Af Amer: 22 mL/min — ABNORMAL LOW (ref >60)
Glucose, Bld: 168 mg/dL — ABNORMAL HIGH (ref 70–99)
Potassium: 4.3 mmol/L (ref 3.5–5.1)
Sodium: 140 mmol/L (ref 135–145)
Total Bilirubin: 0.9 mg/dL (ref 0.3–1.2)
Total Protein: 6.3 g/dL — ABNORMAL LOW (ref 6.5–8.1)

## 2019-07-03 LAB — GLUCOSE, CAPILLARY
Glucose-Capillary: 134 mg/dL — ABNORMAL HIGH (ref 70–99)
Glucose-Capillary: 127 mg/dL — ABNORMAL HIGH (ref 70–99)
Glucose-Capillary: 142 mg/dL — ABNORMAL HIGH (ref 70–99)
Glucose-Capillary: 155 mg/dL — ABNORMAL HIGH (ref 70–99)

## 2019-07-03 LAB — HIV ANTIBODY (ROUTINE TESTING W REFLEX): HIV Screen 4th Generation wRfx: NONREACTIVE

## 2019-07-03 LAB — TYPE AND SCREEN
ABO/RH(D): A POS
Antibody Screen: NEGATIVE

## 2019-07-03 LAB — D-DIMER, QUANTITATIVE: D-Dimer, Quant: 3.76 ug/mL-FEU — ABNORMAL HIGH (ref 0.00–0.50)

## 2019-07-03 LAB — ABO/RH: ABO/RH(D): A POS

## 2019-07-03 LAB — C-REACTIVE PROTEIN: CRP: 27.3 mg/dL — ABNORMAL HIGH (ref <1.0)

## 2019-07-03 MED ORDER — VANCOMYCIN HCL 750 MG/150ML IV SOLN
750.0000 mg | INTRAVENOUS | Status: DC
  Filled 2019-07-03: qty 150

## 2019-07-03 MED ORDER — FUROSEMIDE 10 MG/ML IJ SOLN: 60.0000 mg | Freq: Once | INTRAMUSCULAR | Status: DC

## 2019-07-03 MED ORDER — SODIUM CHLORIDE 0.9 % IV SOLN
2.0000 g | INTRAVENOUS | Status: AC
  Administered 2019-07-03 – 2019-07-08 (×6): 2 g via INTRAVENOUS
  Filled 2019-07-03 (×6): qty 2

## 2019-07-03 MED ORDER — ACETAMINOPHEN 325 MG PO TABS
650.0000 mg | ORAL_TABLET | Freq: Once | ORAL | Status: AC
  Administered 2019-07-03: 650 mg via ORAL
  Filled 2019-07-03: qty 2

## 2019-07-03 MED ORDER — SODIUM CHLORIDE 0.9% IV SOLUTION: Freq: Once | INTRAVENOUS | Status: DC

## 2019-07-03 MED ORDER — DIPHENHYDRAMINE HCL 50 MG/ML IJ SOLN
25.0000 mg | Freq: Once | INTRAMUSCULAR | Status: AC
  Administered 2019-07-03: 25 mg via INTRAVENOUS
  Filled 2019-07-03: qty 1

## 2019-07-03 NOTE — Progress Notes (Signed)
1800- Updates given to patient's brother. Time allowed for concerns and questions

## 2019-07-03 NOTE — Progress Notes (Signed)
PROGRESS NOTE                                                                                                                                                                                                             Patient Demographics:    [name], is a 64 y.o. female, DOB - October 23, 1955, [id number]  Outpatient Primary MD for the patient is Celene Squibb, MD   Admit date - 07/24/2019   LOS - 1  Chief Complaint  Patient presents with  . Shortness of Breath  . covid pos       Brief Narrative: Patient is a 64 y.o. 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  Post admission-she had significant worsening of her hypoxemia-requiring 100% NRB-she was then transferred to [facility].  See below for further details.   Subjective:    [name] today appears comfortable-although she was on 100% nonrebreather mask   Assessment  & Plan :   Acute Hypoxic Resp Failure due to Covid 19 Viral pneumonia and concurrent bacterial pneumonia: Remains severely hypoxemic-on 100% NRB during my evaluation-plans are to continue with steroids/remdesivir and empiric antimicrobial therapy.  Not a candidate for Actemra given significantly elevated procalcitonin level.  Monitor closely-she develops respiratory distress-she will need to be moved to the ICU.  She remains a full code.  Fever: afebrile  O2 requirements:  SpO2: 93 % O2 Flow Rate (L/min): 15 L/min   COVID-19 Labs: Recent Labs    06/29/2019 1454 07/03/19 0804  DDIMER 4.12*  3.76*  FERRITIN 3,926*  --   LDH 662*  --   CRP 25.9* 27.3*       Component Value Date/Time   BNP 1,321.0 (H) 07/08/2019 1455    Recent Labs  Lab 07/07/2019 1454  PROCALCITON 6.29    Lab Results  Component Value Date   SARSCOV2NAA Detected (A) 06/26/2019   [place] NEGATIVE 02/20/2019   [place] Not Detected 12/18/2018     COVID-19 Medications: Steroids: 1/6>> Remdesivir: 1/6>> Convalescent Plasma: 1/7 x  1 ordered  Other medications: Diuretics:Euvolemic-hold diuretics Antibiotics: Cefepime: 1/6>> Vancomycin: 1/6>>  Prone/Incentive Spirometry: encouraged patient to lie prone for 3-4 hours at a time for a total of 16 hours a day, and to encourage incentive spirometry use 3-4/hour.  DVT Prophylaxis  : Xarelto  Persistent atrial fibrillation s/p AV node ablation and PPM insertion: Continue metoprolol-on Xarelto.  Rate under good control.  Paced rhythm on telemetry.  Note has history of amiodarone induced lung toxicity  Chronic combined systolic and diastolic heart failure: Euvolemic on exam-hold diuretics today.  Bronchial asthma: Appears stable-no rhonchi-continue bronchodilators.  AKI on CKD stage IIIa: AKI likely hemodynamically mediated-volume status is stable.  Creatinine slowly improving-follow.  HTN: Blood pressure controlled  DM-2: BG stable-continue SSI  CBG (last 3)  Recent Labs    06/29/2019 1541 07/03/19 0800 07/03/19 1223  GLUCAP 133* 134* 127*   Hypothyroidism: Continue levothyroxine  History of amiodarone induced lung toxicity/ILD: Supportive care.  Consults  :  PCCM  Procedures  :  None  ABG:    Component Value Date/Time   PHART 7.427 05/15/2017 1158   PCO2ART 37.7 05/15/2017 1158   PO2ART 61.0 (L) 05/15/2017 1158   HCO3 24.9 05/15/2017 1158   TCO2 26 05/15/2017 1158   O2SAT 92.0 05/15/2017 1158    Vent Settings: N/A  Condition - Extremely Guarded-very tenuous with risk for further deterioration  Family Communication  :   Brother-updated over the phone  Code Status :  Full Code  Diet :  Diet Order            Diet heart healthy/carb modified Room service appropriate? Yes; Fluid consistency: Thin  Diet effective now               Disposition Plan  :  Remain hospitalized  Barriers to discharge: Hypoxia requiring O2 supplementation/complete 5 days of IV Remdesivir  Antimicorbials  :    Anti-infectives (From admission, onward)   Start     Dose/Rate Route Frequency Ordered Stop   07/04/19 1830  vancomycin (VANCOREADY) IVPB 750 mg/150 mL     750 mg 150 mL/hr over 60 Minutes Intravenous Every 48 hours 07/03/19 1249     07/04/19 1800  vancomycin (VANCOCIN) IVPB 1000 mg/200 mL premix  Status:  Discontinued     1,000 mg 200 mL/hr over 60 Minutes Intravenous Every 48 hours 07/26/2019 1727 07/03/19 1249   07/03/19 1800  ceFEPIme (MAXIPIME) 1 g in sodium chloride 0.9 % 100 mL IVPB  Status:  Discontinued     1 g 200 mL/hr over 30 Minutes Intravenous Every 24 hours 07/27/2019 1725 07/03/19 1248   07/03/19 1800  ceFEPIme (MAXIPIME) 2 g in sodium chloride 0.9 % 100 mL IVPB     2 g 200 mL/hr over 30 Minutes Intravenous Every 24 hours 07/03/19 1247     07/03/19 1000  remdesivir 100 mg in sodium chloride 0.9 % 100 mL IVPB     100 mg 200 mL/hr over 30 Minutes Intravenous Daily 07/24/2019 1723 07/07/19 0959   07/03/19 1000  remdesivir 100 mg in sodium chloride 0.9 % 100 mL IVPB  Status:  Discontinued     100 mg 200 mL/hr over 30 Minutes Intravenous Daily 07/05/2019 2355 07/03/19 0257   07/23/2019 2354  remdesivir 200 mg in sodium chloride 0.9% 250 mL IVPB  Status:  Discontinued     200 mg 580 mL/hr over 30 Minutes Intravenous Once 07/10/2019 2355 07/03/19 0257   07/25/2019 1800  remdesivir 200 mg in sodium  chloride 0.9% 250 mL IVPB     200 mg 580 mL/hr over 30 Minutes Intravenous Once 06/27/2019 1723 07/01/2019 2306   07/07/2019 1730  vancomycin (VANCOCIN) IVPB 1000 mg/200 mL premix  Status:  Discontinued     1,000 mg 200 mL/hr  over 60 Minutes Intravenous  Once 07/18/2019 1722 07/17/2019 1725   07/08/2019 1730  ceFEPIme (MAXIPIME) 2 g in sodium chloride 0.9 % 100 mL IVPB     2 g 200 mL/hr over 30 Minutes Intravenous  Once 07/18/2019 1722 07/25/2019 1918   06/27/2019 1730  vancomycin (VANCOREADY) IVPB 1500 mg/300 mL     1,500 mg 150 mL/hr over 120 Minutes Intravenous  Once 07/07/2019 1725 07/27/2019 2036      Inpatient Medications  Scheduled Meds: . vitamin C  500 mg Oral Daily  . dexamethasone (DECADRON) injection  10 mg Intravenous Q24H  . fluticasone furoate-vilanterol  2 puff Inhalation Daily  . insulin aspart  0-15 Units Subcutaneous TID WC  . levothyroxine  100 mcg Oral QAC breakfast  . metoprolol succinate  50 mg Oral Daily  . pantoprazole  40 mg Oral BID  . pravastatin  10 mg Oral Daily  . Rivaroxaban  15 mg Oral Q breakfast  . zinc sulfate  220 mg Oral Daily   Continuous Infusions: . ceFEPime (MAXIPIME) IV    . remdesivir 100 mg in NS 100 mL 100 mg (07/03/19 1035)  . [START ON 07/04/2019] vancomycin     PRN Meds:.acetaminophen, albuterol, chlorpheniramine-HYDROcodone, guaiFENesin-dextromethorphan, senna-docusate   Time Spent in minutes  55  The patient is critically ill with multiple organ system failure and requires high complexity decision making for assessment and support, frequent evaluation and titration of therapies, advanced monitoring, review of radiographic studies and interpretation of complex data.   See all Orders from today for further details   Oren Binet M.D on 07/03/2019 at 2:12 PM  To page go to [email or web address] - use universal password  [facility] -  Office  [phone]    Objective:   Vitals:   07/03/19 0740 07/03/19 0751 07/03/19 0815 07/03/19 1200  BP:      Pulse:    61  Resp:      Temp:    97.7 F (36.5 C)  TempSrc:    Oral  SpO2: 99% 98% 99% 93%  Weight:      Height:        Wt Readings from Last 3 Encounters:  07/25/2019 59.9 kg  04/01/19 65.8 kg  04/01/19  64.4 kg     Intake/Output Summary (Last 24 hours) at 07/03/2019 1412 Last data filed at 07/03/2019 0015 Gross per 24 hour  Intake 350 ml  Output --  Net 350 ml     Physical Exam Gen Exam:Alert awake-not in any distress HEENT:atraumatic, normocephalic Chest: B/L clear to auscultation anteriorly CVS:S1S2 regular Abdomen:soft non tender, non distended Extremities:no edema Neurology: Non focal Skin: no rash   Data Review:    CBC Recent Labs  Lab 07/10/2019 1454 07/03/19 0804  WBC 7.3 4.1  HGB 14.4 14.4  HCT 45.0 47.0*  PLT 267 236  MCV 97.4 99.8  MCH 31.2 30.6  MCHC 32.0 30.6  RDW 13.5 13.3  LYMPHSABS 0.6* 0.5*  MONOABS 0.3 0.1  EOSABS 0.0 0.0  BASOSABS 0.0 0.0    Chemistries  Recent Labs  Lab 07/18/2019 1454 07/03/19 0804  NA 136 140  K 3.6 4.3  CL 105 108  CO2 16* 18*  GLUCOSE 146* 168*  BUN  52* 59*  CREATININE 2.43* 2.29*  CALCIUM 8.5* 8.3*  AST 69* 56*  ALT 25 23  ALKPHOS 104 89  BILITOT 1.0 0.9   ------------------------------------------------------------------------------------------------------------------ Recent Labs    07/23/2019 1454  TRIG 127    No results found for: HGBA1C ------------------------------------------------------------------------------------------------------------------ No results for input(s): TSH, T4TOTAL, T3FREE, THYROIDAB in the last 72 hours.  Invalid input(s): FREET3 ------------------------------------------------------------------------------------------------------------------ Recent Labs    07/13/2019 1454  FERRITIN 3,926*    Coagulation profile No results for input(s): INR, PROTIME in the last 168 hours.  Recent Labs    07/25/2019 1454 07/03/19 0804  DDIMER 4.12* 3.76*    Cardiac Enzymes No results for input(s): CKMB, TROPONINI, MYOGLOBIN in the last 168 hours.  Invalid input(s): CK ------------------------------------------------------------------------------------------------------------------     Component Value Date/Time   BNP 1,321.0 (H) 07/09/2019 1455    Micro Results Recent Results (from the past 240 hour(s))  Novel Coronavirus, NAA ([facility])     Status: Abnormal   Collection Time: 06/26/19 10:17 AM   Specimen: Nasopharyngeal(NP) swabs in vial transport medium   NASOPHARYNGE  TESTING  Result Value Ref Range Status   SARS-CoV-2, NAA Detected (A) Not Detected Final    Comment: This nucleic acid amplification test was developed and its performance characteristics determined by [employer]. Nucleic acid amplification tests include PCR and TMA. This test has not been FDA cleared or approved. This test has been authorized by FDA under an Emergency Use Authorization (EUA). This test is only authorized for the duration of time the declaration that circumstances exist justifying the authorization of the emergency use of in vitro diagnostic tests for detection of SARS-CoV-2 virus and/or diagnosis of COVID-19 infection under section 564(b)(1) of the Act, 21 U.S.C. [id number]) (1), unless the authorization is terminated or revoked sooner. When diagnostic testing is negative, the possibility of a false negative result should be considered in the context of a patient's recent exposures and the presence of clinical signs and symptoms consistent with COVID-19. An individual without symptoms of COVID-19 and who is not shedding SARS-CoV-2 virus would  expect to have a negative (not detected) result in this assay.   Blood Culture (routine x 2)     Status: None (Preliminary result)   Collection Time: 07/11/2019  2:54 PM   Specimen: Left Antecubital; Blood  Result Value Ref Range Status   Specimen Description LEFT ANTECUBITAL  Final   Special Requests   Final    BOTTLES DRAWN AEROBIC AND ANAEROBIC Blood Culture adequate volume   Culture   Final    NO GROWTH < 24 HOURS Performed at [facility], [address], [place], [place] [address]    Report Status PENDING  Incomplete   Blood Culture (routine x 2)     Status: None (Preliminary result)   Collection Time: 07/23/2019  4:34 PM   Specimen: BLOOD LEFT ARM  Result Value Ref Range Status   Specimen Description BLOOD LEFT ARM  Final   Special Requests   Final    BOTTLES DRAWN AEROBIC AND ANAEROBIC Blood Culture adequate volume   Culture   Final    NO GROWTH < 24 HOURS Performed at [facility], [address], [place], [place] [address]    Report Status PENDING  Incomplete    Radiology Reports DG Chest Port 1 View  Result Date: 07/03/2019 CLINICAL DATA:  COVID-19 positive.  Worsening shortness of breath. EXAM: PORTABLE CHEST 1 VIEW COMPARISON:  07/17/2019. FINDINGS: Surgical clips noted over the neck. AICD in stable position. Stable  cardiomegaly. Diffuse severe bilateral pulmonary infiltrates again noted. Slight improvement in aeration on today's exam. No pleural effusion pneumothorax. Old infarct proximal right humerus. No acute bony abnormality. IMPRESSION: 1.  AICD in stable position.  Stable cardiomegaly. 2. Diffuse severe bilateral pulmonary infiltrates again noted. Slight improvement in aeration on today's exam. Electronically Signed   By: Marcello Moores  Register   On: 07/03/2019 07:15   DG Chest Port 1 View  Result Date: 07/15/2019 CLINICAL DATA:  Hypoxia. EXAM: PORTABLE CHEST 1 VIEW COMPARISON:  None. FINDINGS: There is a dual lead AICD. Moderate severity diffuse bilateral infiltrates are seen. There is a small right pleural effusion. No pneumothorax is identified. The cardiac silhouette is markedly enlarged. Multiple radiopaque surgical clips are seen overlying the superior mediastinum. There is marked severity levoscoliosis of the thoracic spine with moderate severity multilevel degenerative changes. IMPRESSION: 1. Moderate severity diffuse bilateral infiltrates with a small right pleural effusion. 2. Marked enlargement of the cardiac silhouette. 3. Dual lead AICD in place. Electronically Signed   By: Virgina Norfolk  M.D.   On: 07/26/2019 15:50   MM 3D SCREEN BREAST BILATERAL  Result Date: 06/12/2019 CLINICAL DATA:  Screening. EXAM: DIGITAL SCREENING BILATERAL MAMMOGRAM WITH TOMO AND CAD COMPARISON:  Previous exam(s). ACR Breast Density Category a: The breast tissue is almost entirely fatty. FINDINGS: There are no findings suspicious for malignancy. Images were processed with CAD. IMPRESSION: No mammographic evidence of malignancy. A result letter of this screening mammogram will be mailed directly to the patient. RECOMMENDATION: Screening mammogram in one year. (Code:SM-B-01Y) BI-RADS CATEGORY  1: Negative. Electronically Signed   By: Nolon Nations M.D.   On: 06/12/2019 16:42

## 2019-07-03 NOTE — Progress Notes (Signed)
Pt given IS, a little sleepy at this time.  RT will continue to monitor.

## 2019-07-03 NOTE — Progress Notes (Addendum)
Called into room by Nurse tech. Observed patient having increased work on breathing. Patient c/o S.O.B. H.O.B elevated. Patient advised to take deep breaths. Dr. Darrick Meigs paged with new order for stat CXR and increase oxygen. Oxygen increased to 10 L/min. O2 sats ranging from 76-88% on 10 L/min HFNC.

## 2019-07-03 NOTE — Care Plan (Signed)
Report called to [facility]. Estimated ETA for transport is 930am. Will notify family of transfer.

## 2019-07-03 NOTE — Plan of Care (Signed)
Attempted to notify pt's brother, Eddie Dibbles, of transfer with no answer.

## 2019-07-03 NOTE — Progress Notes (Signed)
Called by RN that patient is now requiring 10 to 15 L high flow nasal cannula, O2 sats 82%. We will order stat chest x-ray Switch to nonrebreather mask We will initiate transfer to [facility]

## 2019-07-03 NOTE — Progress Notes (Signed)
Pharmacy Antibiotic Note  [name] is a 64 y.o. female admitted on 07/01/2019 with pneumonia.  Pharmacy has been consulted 1/6 for Vancomycin and Cefepime dosing. Vancomycin 1500 mg IV  loading dose and Cefepime 1gm given 07/17/2019 ([facility]) SCr 2.43> 2.29 WBC 7.3>4.1,  PCT 6.29,  CRP 25.9>27.3, LA 2.78>2.1 Afeb, Tm 103.2, afeb   Plan: Vanc loading dose given 1/6 ([facility]) , Adjust Vancomycin to 750 mg IV every 48 hours. Est AUC 486, Scr used 2.29 Adjust Cefepime 2g IV every 24 hours. Monitor labs, c/s, and vanco level as indicated  Height: 4\' 10"  (147.3 cm) Weight: 132 lb 0.9 oz (59.9 kg) IBW/kg (Calculated) : 40.9  Temp (24hrs), Avg:99 F (37.2 C), Min:97.7 F (36.5 C), Max:103.2 F (39.6 C)  Recent Labs  Lab 06/30/2019 1454 06/27/2019 1634 07/03/19 0804  WBC 7.3  --  4.1  CREATININE 2.43*  --  2.29*  LATICACIDVEN 2.8* 2.1*  --     Estimated Creatinine Clearance: 19.3 mL/min (A) (by C-G formula based on SCr of 2.29 mg/dL (H)).    Allergies  Allergen Reactions  . Flecainide Nausea Only and Other (See Comments)    Faint feeling  . Hydrocodone-Acetaminophen Nausea Only and Other (See Comments)    Severe headache  . Ibuprofen Other (See Comments)    Kidney dysfunction  . Oxycodone Hcl Nausea Only and Other (See Comments)    Headache  . Penicillins Nausea Only and Other (See Comments)    Severe headache Has patient had a PCN reaction causing immediate rash, facial/tongue/throat swelling, SOB or lightheadedness with hypotension: No Has patient had a PCN reaction causing severe rash involving mucus membranes or skin necrosis: No Has patient had a PCN reaction that required hospitalization: No Has patient had a PCN reaction occurring within the last 10 years: No If all of the above answers are "NO", then may proceed with Cephalosporin use.     Antimicrobials this admission: Vanco 1/6 >>  Cefepime 1/6 >>  Remdesivir 1/6>.  Dose adjustments this  admission: Vanco/Cefepime  Microbiology results: 1/6 BCx: ngtd < 24  Hrs 12/31 (PTA) COVID (+)  Thank you for allowing pharmacy to be a part of this patient's care. Nicole Cella, RPh Clinical Pharmacist 07/03/2019 12:51 PM

## 2019-07-03 NOTE — Consult Note (Signed)
NAME:  [name], MRN:  [id number], DOB:  1956/02/27, LOS: 1 ADMISSION DATE:  07/20/2019, CONSULTATION DATE:  07/03/2019 REFERRING MD:  Dr. Dyann Kief, [facility], CHIEF COMPLAINT:  Short of breath   Brief History   64 yo female presented with progressive cough and dyspnea.  Found to have hypoxia from COVID 19 pneumonia.  Followed by Dr. Elsworth Soho in pulmonary office for asthma.  History of present illness   64 yo female was around family during Christmas time.  Some of these family members were diagnosed with COVID 45.  She started getting cough with dyspnea about 3 days prior to admission.  Her symptoms progressed, and she presented to the hospital.  She was found to have hypoxia and pulmonary infiltrates.  COVID 19 testing positive.  Started on decadron, remdesivir and supplemental oxygen.  Noted to have elevated procalcitonin with concern for bacterial superinfection and started on antibiotics.    Has sore throat.  Denies any change in sense of smell or taste.  Denies chest pain.  Had loose BM this morning, but not prior.  Past Medical History  Varicose veins, Scoliosis, Schatzki's ring, PAD, Persistent A fib, CAD with non ischemic CM, Microscopic colitis, Anemia, Hypothyroidism, HTN, HLD, Gout, GERD, Diverticulosis, DM, Amiodarone toxicity  Significant Hospital Events   1/06 Admit 1/07 Transfer to [facility]  Consults:    Procedures:    Significant Diagnostic Tests:    Micro Data:  SARS CoV 2, NAA >> Dectected  COVID Therapy  Decadron 1/06 >> Remdesivir 1/06 >>  Antimicrobials:  Vancomycin 1/06 >> Cefepime 1/06 >>   Interim history/subjective:    Objective   Blood pressure 104/74, pulse 74, temperature 97.8 F (36.6 C), temperature source Oral, resp. rate (!) 24, height 4\' 10"  (1.473 m), weight 59.9 kg, SpO2 99 %.        Intake/Output Summary (Last 24 hours) at 07/03/2019 1044 Last data filed at 07/03/2019 0015 Gross per 24 hour  Intake 350 ml  Output --  Net 350 ml   Filed  Weights   06/28/2019 1415 06/30/2019 2352  Weight: 65.8 kg 59.9 kg    Examination:  General - alert Eyes - pupils reactive ENT - no sinus tenderness, no stridor Cardiac - regular rate/rhythm, no murmur Chest - b/l crackles more at bases Abdomen - soft, non tender, + bowel sounds Extremities - no cyanosis, clubbing, or edema Skin - no rashes Neuro - normal strength, moves extremities, follows commands Lymphatics - no lymphadenopathy Psych - normal mood and behavior  CXR (reviewed by me) - b/l ASD  Resolved Hospital Problem list     Assessment & Plan:   Acute hypoxic respiratory failure from COVID 19 pneumonia. - day 2 of remdesivir and decadron - would defer tocilizumab in setting of possible bacterial superinfection - oxygen to keep SpO2 85 to 95% - prone positioning as able - f/u CXR intermittently - work of breathing acceptable at present >> monitor need for mechanical ventilation - continue vit C, zinc  Elevated troponin with concern for bacterial pneumonia. - day 2 of ABx  Hx of asthma. - breo with prn albuterol  CAD with non ischemic CM with chronic systolic CHF. Persistent A fib. Hx of HTN, HLD. - continue toprol, pravachol, xarelto  DM type II with steroid induced hyperglycemia. - SSI  Hx of hypothyroidism. - continue synthroid  Best practice:  Diet: Carb modified, heart healthy DVT prophylaxis: xarelto GI prophylaxis: protonix Mobility: OOB to chair as able Code Status: Full code Disposition:  arranging for transfer to [place]: Recent Labs  Lab 07/12/2019 1454 07/03/19 0804  WBC 7.3 4.1  NEUTROABS 6.2 3.5  HGB 14.4 14.4  HCT 45.0 47.0*  MCV 97.4 99.8  PLT 267 [id number]    Basic Metabolic Panel: Recent Labs  Lab 07/05/2019 1454 07/03/19 0804  NA 136 140  K 3.6 4.3  CL 105 108  CO2 16* 18*  GLUCOSE 146* 168*  BUN 52* 59*  CREATININE 2.43* 2.29*  CALCIUM 8.5* 8.3*   GFR: Estimated Creatinine Clearance: 19.3  mL/min (A) (by C-G formula based on SCr of 2.29 mg/dL (H)). Recent Labs  Lab 07/19/2019 1454 07/27/2019 1634 07/03/19 0804  PROCALCITON 6.29  --   --   WBC 7.3  --  4.1  LATICACIDVEN 2.8* 2.1*  --     Liver Function Tests: Recent Labs  Lab 07/07/2019 1454 07/03/19 0804  AST 69* 56*  ALT 25 23  ALKPHOS 104 89  BILITOT 1.0 0.9  PROT 7.2 6.3*  ALBUMIN 3.0* 2.5*   No results for input(s): LIPASE, AMYLASE in the last 168 hours. No results for input(s): AMMONIA in the last 168 hours.  ABG    Component Value Date/Time   PHART 7.427 05/15/2017 1158   PCO2ART 37.7 05/15/2017 1158   PO2ART 61.0 (L) 05/15/2017 1158   HCO3 24.9 05/15/2017 1158   TCO2 26 05/15/2017 1158   O2SAT 92.0 05/15/2017 1158     Coagulation Profile: No results for input(s): INR, PROTIME in the last 168 hours.  Cardiac Enzymes: No results for input(s): CKTOTAL, CKMB, CKMBINDEX, TROPONINI in the last 168 hours.  HbA1C: No results found for: HGBA1C  CBG: Recent Labs  Lab 07/17/2019 1541 07/03/19 0800  GLUCAP 133* 134*    Review of Systems:   Reviewed and negative  Past Medical History  She,  has a past medical history of Amiodarone toxicity, Anemia, Chronic bronchitis, Chronic systolic CHF (congestive heart failure) ([place]), Congenital heart disease, Diabetes mellitus without complication ([place]), Diverticulosis, Fall due to stumbling (October 08, 2013), GERD (gastroesophageal reflux disease), Gout, Hemorrhoids, Hyperlipidemia, Hyperparathyroidism, Hypertension, Hypothyroidism, IDA (iron deficiency anemia), Microscopic colitis (9/11), NICM (nonischemic cardiomyopathy) - tachycardia induced in the setting of AFib ([place]), Non-obstructive CAD (coronary artery disease), Paroxysmal SVT (supraventricular tachycardia) ([place]), Persistent atrial fibrillation ([place]) (06/2013), Presence of permanent cardiac pacemaker (06/29/2017), PVD (peripheral vascular disease) ([place]) (09/2004), Renal insufficiency, Schatzki's ring, Scoliosis,  and Varicose veins (right leg pain and swelling).   Surgical History    Past Surgical History:  Procedure Laterality Date  . ASD REPAIR  Age 68   [facility]  . AV NODE ABLATION N/A 06/29/2017   Procedure: AV NODE ABLATION;  Surgeon: Evans Lance, MD;  Location: [place] [facility];  Service: Cardiovascular;  Laterality: N/A;  . BIOPSY  02/13/2017   Procedure: BIOPSY;  Surgeon: Daneil Dolin, MD;  [facility];  Service: Endoscopy;;  duodenum gastric  . BIV PACEMAKER INSERTION CRT-P N/A 06/29/2017   Procedure: BIV PACEMAKER INSERTION CRT-P;  Surgeon: Evans Lance, MD;  Location: [place] [facility];  Service: Cardiovascular;  Laterality: N/A;  . CARDIOVERSION N/A 11/13/2013   Procedure: CARDIOVERSION;  Surgeon: Herminio Commons, MD;  Location: [facility] ORS;  Service: Endoscopy;  Laterality: N/A;  . COLONOSCOPY  03/04/2010   anal canal hemorrhoids otherwise normal. TI normal. Bx showed lymphocytic colitis. next TCS 02/2020  . COLONOSCOPY  April 2008   Rehman: Pancolonic  diverticulosis, external hemorrhoids  . COLONOSCOPY N/A 11/30/2015   RMR: Normal terminal ileum for 10 cm, nonbleeding grade 1 internal hemorrhoids, colonic diverticulosis. Next colonoscopy in 2027.  Marland Kitchen COLONOSCOPY WITH PROPOFOL N/A 02/24/2019   Procedure: COLONOSCOPY WITH PROPOFOL;  Surgeon: Daneil Dolin, MD;  [facility];  Service: Endoscopy;  Laterality: N/A;  11:15am  . ELAS  06-01-11   Right saphenous ELAS   . ESOPHAGEAL DILATION N/A 05/28/2015   Procedure: ESOPHAGEAL DILATION;  Surgeon: Daneil Dolin, MD;  [facility];  Service: Endoscopy;  Laterality: N/A;  . ESOPHAGOGASTRODUODENOSCOPY  03/04/2010   noncritical appearing Schatzki ring. SB bx negative  . ESOPHAGOGASTRODUODENOSCOPY  05/2008   erosive reflux esophagatitis, noncritical Schatzki ring  . ESOPHAGOGASTRODUODENOSCOPY N/A 05/28/2015   [id number] esophagus somewhat baggy, likely due to underlying  esophageal motility disorder/small HH   . ESOPHAGOGASTRODUODENOSCOPY N/A 02/13/2017   Dr. Gala Romney: subtly abnormal stomach of doubtful clinical significant s/p biopsy, normal duodenum s/p biopsy, fundic gland polyp on path, normal duodenal biopsy  . ESOPHAGOGASTRODUODENOSCOPY (EGD) WITH PROPOFOL N/A 02/24/2019   Procedure: ESOPHAGOGASTRODUODENOSCOPY (EGD) WITH PROPOFOL;  Surgeon: Daneil Dolin, MD;  [facility];  Service: Endoscopy;  Laterality: N/A;  . [place] SURGERY  2010  . Left hemithyroidectomy    . Left parathyroidectomy  2007  . MALONEY DILATION N/A 02/24/2019   Procedure: Venia Minks DILATION;  Surgeon: Daneil Dolin, MD;  [facility];  Service: Endoscopy;  Laterality: N/A;  . Parathyroid adenoma  2007  . Right common femoral endarterectomy  2007  . RIGHT/LEFT HEART CATH AND CORONARY ANGIOGRAPHY N/A 05/15/2017   Procedure: RIGHT/LEFT HEART CATH AND CORONARY ANGIOGRAPHY;  Surgeon: Troy Sine, MD;  Location: [place] [facility];  Service: Cardiovascular;  Laterality: N/A;  . Small bowel capsule  12 2009   Mid to distal small bowel with edema, erosions, tiny ulceration felt to be NSAID related  . TONSILLECTOMY       Social History   reports that she has never smoked. She has never used smokeless tobacco. She reports that she does not drink alcohol or use drugs.   Family History   Her family history includes Cancer in her father and maternal grandmother; Congestive Heart Failure in her mother; Heart attack in her paternal grandmother; Heart disease in her mother; Hyperlipidemia in her mother; Hypertension in her mother; Lung cancer in her father; Other in her brother; Stroke in her mother. There is no history of Colon cancer or Colon polyps.   Allergies Allergies  Allergen Reactions  . Flecainide Nausea Only and Other (See Comments)    Faint feeling  . Hydrocodone-Acetaminophen Nausea Only and Other (See Comments)    Severe headache  . Ibuprofen Other  (See Comments)    Kidney dysfunction  . Oxycodone Hcl Nausea Only and Other (See Comments)    Headache  . Penicillins Nausea Only and Other (See Comments)    Severe headache Has patient had a PCN reaction causing immediate rash, facial/tongue/throat swelling, SOB or lightheadedness with hypotension: No Has patient had a PCN reaction causing severe rash involving mucus membranes or skin necrosis: No Has patient had a PCN reaction that required hospitalization: No Has patient had a PCN reaction occurring within the last 10 years: No If all of the above answers are "NO", then may proceed with Cephalosporin use.      Home Medications  Prior to Admission medications   Medication Sig Start Date End Date Taking? Authorizing Provider  acetaminophen (TYLENOL) 500 MG tablet Take 1,000 mg by mouth every 6 (six) hours as needed (for pain.).    Yes [provider]  BREO ELLIPTA 100-25 MCG/INH AEPB Inhale 2 puffs into the lungs daily. 07/10/18  Yes [provider]  cyclobenzaprine (FLEXERIL) 5 MG tablet Take 5 mg by mouth 3 (three) times daily as needed for muscle spasms.  11/28/18  Yes [provider]  furosemide (LASIX) 40 MG tablet Take 1 tablet (40 mg total) by mouth daily. May take extra 1 daily as needed Patient taking differently: Take 40 mg by mouth daily.  03/21/19  Yes Parrett, Tammy S, NP  gabapentin (NEURONTIN) 100 MG capsule Take 100 mg by mouth 2 (two) times daily.  04/20/17  Yes [provider]  hydrALAZINE (APRESOLINE) 25 MG tablet Take 0.5 tablets (12.5 mg total) by mouth every 8 (eight) hours. 05/17/17  Yes Theora Gianotti, NP  isosorbide mononitrate (IMDUR) 30 MG 24 hr tablet Take 0.5 tablets (15 mg total) by mouth daily. 05/18/17  Yes Theora Gianotti, NP  levothyroxine (SYNTHROID) 100 MCG tablet Take 100 mcg by mouth daily before breakfast.  12/02/18  Yes [provider]  loperamide (IMODIUM) 2 MG capsule Take 2-4 mg by mouth  4 (four) times daily as needed for diarrhea or loose stools.   Yes [provider]  metoprolol succinate (TOPROL-XL) 50 MG 24 hr tablet TAKE 1 TABLET BY MOUTH ONCE DAILY. Patient taking differently: Take 50 mg by mouth daily.  07/08/18  Yes Evans Lance, MD  pantoprazole (PROTONIX) 40 MG tablet Take 40 mg by mouth 2 (two) times daily.    Yes [provider]  polyethylene glycol (MIRALAX / GLYCOLAX) packet Take 17 g by mouth daily as needed (for constipation.).    Yes [provider]  potassium chloride (K-DUR) 10 MEQ tablet Take 1 tablet (10 mEq total) by mouth every other day. Patient taking differently: Take 10 mEq by mouth daily.  06/30/17  Yes Theora Gianotti, NP  pravastatin (PRAVACHOL) 10 MG tablet Take 10 mg by mouth daily.   Yes [provider]  PROAIR HFA 108 (90 Base) MCG/ACT inhaler INHALE 2 PUFFS EVERY 6 HOURS AS NEEDED FOR SHORTNESS OF BREATH AND WHEEZING. Patient taking differently: Inhale 2 puffs into the lungs every 6 (six) hours as needed for wheezing or shortness of breath.  07/19/18  Yes Rigoberto Noel, MD  Probiotic Product (ALIGN PO) Take 1 tablet by mouth daily.    Yes [provider]  sitaGLIPtin (JANUVIA) 50 MG tablet Take 50 mg by mouth every morning.    Yes [provider]  temazepam (RESTORIL) 15 MG capsule Take 15 mg by mouth at bedtime as needed for sleep.  02/22/17  Yes [provider]  tiZANidine (ZANAFLEX) 2 MG tablet Take 2 mg by mouth every 8 (eight) hours as needed.  01/22/19  Yes [provider]  traMADol (ULTRAM) 50 MG tablet Take 50 mg by mouth every 6 (six) hours as needed for moderate pain or severe pain.  02/06/19  Yes [provider]  XARELTO 15 MG TABS tablet TAKE 1 TABLET BY MOUTH ONCE A DAY. Patient taking differently: Take 15 mg by mouth at bedtime.  08/30/18  Yes Evans Lance, MD     Chesley Mires, MD [place] [facility] 07/03/2019, 10:59 AM

## 2019-07-03 NOTE — Progress Notes (Signed)
Inpatient Diabetes Program Recommendations  AACE/ADA: New Consensus Statement on Inpatient Glycemic Control   Target Ranges:  Prepandial:   less than 140 mg/dL      Peak postprandial:   less than 180 mg/dL (1-2 hours)      Critically ill patients:  140 - 180 mg/dL   Results for ONELLA, PRIOLEAU (MRN [id number]) as of 07/03/2019 10:10  Ref. Range 07/01/2019 15:41 07/03/2019 08:00  Glucose-Capillary Latest Ref Range: 70 - 99 mg/dL 133 (H) 134 (H)   Review of Glycemic Control  Diabetes history: DM2 Outpatient Diabetes medications: Januvia 50 mg QAM Current orders for Inpatient glycemic control: Novolog 0-15 units TID with meals; Decadron 10 mg Q24H  Inpatient Diabetes Program Recommendations:   Insulin-Correction: Please consider ordering Novolog 0-5 units QHS.  NOTE: Noted consult for Diabetes Coordinator per COVID Glycemic order set. Chart reviewed. Noted patient being transferred to [facility] campus. Fasting 134 mg/dl today. Would recommend adding Novolog 0-5 units QHS for bedtime correction. Will continue to follow along.  Thanks, Barnie Alderman, RN, MSN, CDE Diabetes Coordinator Inpatient Diabetes Program [phone] (Team Pager from 8am to 5pm)

## 2019-07-03 NOTE — Progress Notes (Signed)
Patient placed on non-rebreather. Oxygen increased to 15 L/min. Patient placed in prone position and tolerating position well. Patient no longer c/o S.O.B at this time. Dr. Darrick Meigs made aware of chances.

## 2019-07-03 NOTE — Progress Notes (Signed)
[name], brother called and updated tonight on patients status

## 2019-07-04 LAB — CBC WITH DIFFERENTIAL/PLATELET
Abs Immature Granulocytes: 0.1 10*3/uL — ABNORMAL HIGH (ref 0.00–0.07)
Basophils Absolute: 0 10*3/uL (ref 0.0–0.1)
Basophils Relative: 0 %
Eosinophils Absolute: 0 10*3/uL (ref 0.0–0.5)
Eosinophils Relative: 0 %
HCT: 45.5 % (ref 36.0–46.0)
Hemoglobin: 14.6 g/dL (ref 12.0–15.0)
Immature Granulocytes: 1 %
Lymphocytes Relative: 8 %
Lymphs Abs: 0.6 10*3/uL — ABNORMAL LOW (ref 0.7–4.0)
MCH: 30.8 pg (ref 26.0–34.0)
MCHC: 32.1 g/dL (ref 30.0–36.0)
MCV: 96 fL (ref 80.0–100.0)
Monocytes Absolute: 0.4 10*3/uL (ref 0.1–1.0)
Monocytes Relative: 4 %
Neutro Abs: 7 10*3/uL (ref 1.7–7.7)
Neutrophils Relative %: 87 %
Platelets: 288 10*3/uL (ref 150–400)
RBC: 4.74 MIL/uL (ref 3.87–5.11)
RDW: 13.6 % (ref 11.5–15.5)
WBC: 8.2 10*3/uL (ref 4.0–10.5)
nRBC: 0.5 % — ABNORMAL HIGH (ref 0.0–0.2)

## 2019-07-04 LAB — COMPREHENSIVE METABOLIC PANEL
ALT: 26 U/L (ref 0–44)
AST: 42 U/L — ABNORMAL HIGH (ref 15–41)
Albumin: 2.8 g/dL — ABNORMAL LOW (ref 3.5–5.0)
Alkaline Phosphatase: 95 U/L (ref 38–126)
Anion gap: 14 (ref 5–15)
BUN: 76 mg/dL — ABNORMAL HIGH (ref 8–23)
CO2: 21 mmol/L — ABNORMAL LOW (ref 22–32)
Calcium: 8.6 mg/dL — ABNORMAL LOW (ref 8.9–10.3)
Chloride: 107 mmol/L (ref 98–111)
Creatinine, Ser: 2.33 mg/dL — ABNORMAL HIGH (ref 0.44–1.00)
GFR calc Af Amer: 25 mL/min — ABNORMAL LOW (ref >60)
GFR calc non Af Amer: 22 mL/min — ABNORMAL LOW (ref >60)
Glucose, Bld: 230 mg/dL — ABNORMAL HIGH (ref 70–99)
Potassium: 4.1 mmol/L (ref 3.5–5.1)
Sodium: 142 mmol/L (ref 135–145)
Total Bilirubin: 0.8 mg/dL (ref 0.3–1.2)
Total Protein: 6.7 g/dL (ref 6.5–8.1)

## 2019-07-04 LAB — PREPARE FRESH FROZEN PLASMA

## 2019-07-04 LAB — GLUCOSE, CAPILLARY
Glucose-Capillary: 226 mg/dL — ABNORMAL HIGH (ref 70–99)
Glucose-Capillary: 239 mg/dL — ABNORMAL HIGH (ref 70–99)
Glucose-Capillary: 255 mg/dL — ABNORMAL HIGH (ref 70–99)

## 2019-07-04 LAB — C-REACTIVE PROTEIN: CRP: 17.8 mg/dL — ABNORMAL HIGH (ref <1.0)

## 2019-07-04 LAB — BPAM FFP
Blood Product Expiration Date: 202101082202
ISSUE DATE / TIME: 202101072238
Unit Type and Rh: 6200

## 2019-07-04 LAB — D-DIMER, QUANTITATIVE: D-Dimer, Quant: 4.37 ug/mL-FEU — ABNORMAL HIGH (ref 0.00–0.50)

## 2019-07-04 LAB — ABO/RH: ABO/RH(D): A POS

## 2019-07-04 NOTE — Evaluation (Signed)
Physical Therapy Evaluation Patient Details Name: [name] MRN: [id number] DOB: [birth year] Today's Date: 07/04/2019   History of Present Illness  64 y.o. female, PMHx including amiodarone toxicity, interstitial lung disease, chronic systolic/diastolic heart failure, GERD,  history of congenital heart disease, DM II, iron deficient anemia, hypertension,hyperparathyroidism, peripheral vascular disease, renal insufficiency, persistent A. fib anticoagulated on Xarelto presented to ED w/ shortness of breath, patient was recently diagnosed 12/31 with COVID-19, at baseline has no oxygen requirement, patient reported her symptoms started on 1/3, with progressive dyspnea, fever, chills, cough, neurolyse body ache, denies nausea, vomiting, diarrhea, chest pain, leg edema, or orthopnea. in ED was febrile 103, hypoxic in the mid 80s, requiring initially 6 L nasal cannula, chest x-ray significant for diffuse bilateral opacities, pt admited for COVID-19 for pneumonia.  Clinical Impression   Pt admitted with above diagnosis. PTA was living home alone, but brother lives in duplex next door, she was independent with mobility and most ADLs but had brother to assist as needed. Pt currently with functional limitations due to the deficits listed below (see PT Problem List). This am pt looks lethargic but is very motivated to get moving, she was able to get oob with min-mod a and RW, able to ambulate in room approx 27ft w/ RW and mod a. Pt as on 5L/min via HFNC at rest and sats in 90s, ambulating on 6L/min with min desat at 84%.  Pt will benefit from skilled PT to increase her overall strength, balance and coordination, activity tolerance, independence and safety with mobility to allow discharge to the venue listed below.       Follow Up Recommendations Home health PT    Equipment Recommendations  Rolling walker with 5" wheels    Recommendations for Other Services OT consult     Precautions / Restrictions  Precautions Precautions: Fall Restrictions Weight Bearing Restrictions: No      Mobility  Bed Mobility Overal bed mobility: Needs Assistance Bed Mobility: Supine to Sit     Supine to sit: Min assist        Transfers Overall transfer level: Needs assistance   Transfers: Sit to/from Stand;Stand Pivot Transfers Sit to Stand: Mod assist;Min assist Stand pivot transfers: Mod assist          Ambulation/Gait Ambulation/Gait assistance: Mod assist Gait Distance (Feet): 38 Feet Assistive device: Rolling walker (2 wheeled) Gait Pattern/deviations: Step-to pattern;Wide base of support Gait velocity: decreased   General Gait Details: ambulated short distance in room, head with increased work of breathing, pt on 6L/min via HFNC and min desat 84%  Stairs            Wheelchair Mobility    Modified Rankin (Stroke Patients Only)       Balance Overall balance assessment: Needs assistance Sitting-balance support: Feet supported Sitting balance-Leahy Scale: Fair     Standing balance support: During functional activity;Bilateral upper extremity supported Standing balance-Leahy Scale: Fair                               Pertinent Vitals/Pain Pain Assessment: Faces Faces Pain Scale: Hurts even more Pain Location: some pain with some mobility and bathroom suse Pain Descriptors / Indicators: Grimacing Pain Intervention(s): Limited activity within patient's tolerance    Home Living Family/patient expects to be discharged to:: Private residence Living Arrangements: Alone(lives in duplex with brother next door) Available Help at Discharge: Family Type of Home: Other(Comment)(duplex) Home Access: Stairs to enter  Entrance Stairs-Rails: Left Entrance Stairs-Number of Steps: 5 Home Layout: One level Home Equipment: Grab bars - toilet;Grab bars - tub/shower      Prior Function Level of Independence: Independent               Hand Dominance    Dominant Hand: Right    Extremity/Trunk Assessment   Upper Extremity Assessment Upper Extremity Assessment: Generalized weakness    Lower Extremity Assessment Lower Extremity Assessment: Generalized weakness    Cervical / Trunk Assessment Cervical / Trunk Assessment: Kyphotic  Communication   Communication: No difficulties  Cognition Arousal/Alertness: Lethargic Behavior During Therapy: WFL for tasks assessed/performed Overall Cognitive Status: No family/caregiver present to determine baseline cognitive functioning                                        General Comments      Exercises Other Exercises Other Exercises: incentive spirometer  pulls max 549ml Other Exercises: flutter valve use   Assessment/Plan    PT Assessment Patient needs continued PT services  PT Problem List Decreased strength;Decreased activity tolerance;Decreased balance;Decreased mobility;Decreased coordination;Decreased safety awareness       PT Treatment Interventions DME instruction;Gait training;Functional mobility training;Therapeutic activities;Therapeutic exercise;Balance training;Neuromuscular re-education;Patient/family education    PT Goals (Current goals can be found in the Care Plan section)  Acute Rehab PT Goals Patient Stated Goal: did not voice goals Time For Goal Achievement: 07/18/19 Potential to Achieve Goals: Good    Frequency Min 3X/week   Barriers to discharge        Co-evaluation               AM-PAC PT "6 Clicks" Mobility  Outcome Measure Help needed turning from your back to your side while in a flat bed without using bedrails?: A Little Help needed moving from lying on your back to sitting on the side of a flat bed without using bedrails?: A Little Help needed moving to and from a bed to a chair (including a wheelchair)?: A Lot Help needed standing up from a chair using your arms (e.g., wheelchair or bedside chair)?: A Lot Help needed to walk  in hospital room?: A Lot Help needed climbing 3-5 steps with a railing? : A Lot 6 Click Score: 14    End of Session Equipment Utilized During Treatment: Gait belt;Oxygen Activity Tolerance: Patient limited by fatigue;Treatment limited secondary to medical complications (Comment) Patient left: in chair;with call bell/phone within reach;with chair alarm set Nurse Communication: Mobility status PT Visit Diagnosis: Other abnormalities of gait and mobility (R26.89);Muscle weakness (generalized) (M62.81)    Time: [id number] PT Time Calculation (min) (ACUTE ONLY): 41 min   Charges:   PT Evaluation $PT Eval Moderate Complexity: 1 Mod PT Treatments $Gait Training: 8-22 mins $Therapeutic Activity: 8-22 mins        Horald Chestnut, PT   Delford Field 07/04/2019, 1:30 PM

## 2019-07-04 NOTE — Progress Notes (Signed)
PROGRESS NOTE                                                                                                                                                                                                             Patient Demographics:    [name], is a 64 y.o. female, DOB - 11-16-1955, DH:[id number]  Outpatient Primary MD for the patient is Celene Squibb, MD   Admit date - 07/15/2019   LOS - 2  Chief Complaint  Patient presents with  . Shortness of Breath  . covid pos       Brief Narrative: Patient is a 64 y.o. 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  Post admission-she had significant worsening of her hypoxemia-requiring 100% NRB-she was then transferred to [facility].  See below for further details.   Subjective:    Believe Robbin seems to have improved-she is now on just 5 L of oxygen (was on 15 L of HFNC yesterday)   Assessment  & Plan :   Acute Hypoxic Resp Failure due to Covid 19 Viral pneumonia and concurrent bacterial pneumonia: Seems to have remarkably improved overnight-down to 5 L of oxygen this morning.  CRP downtrending.  Continue steroids/remdesivir-continue cefepime-but suspect we can discontinue vancomycin.   Fever: afebrile  O2 requirements:  SpO2: 94 % O2 Flow Rate (L/min): 5 L/min   COVID-19 Labs: Recent Labs    07/27/2019 1454 07/03/19 0804 07/04/19 0455  DDIMER 4.12* 3.76* 4.37*  FERRITIN 3,926*  --   --   LDH 662*  --   --   CRP 25.9* 27.3* 17.8*       Component Value  Date/Time   BNP 1,321.0 (H) 07/21/2019 1455    Recent Labs  Lab 07/20/2019 1454  PROCALCITON 6.29    Lab Results  Component Value Date   SARSCOV2NAA Detected (A) 06/26/2019   [place] NEGATIVE 02/20/2019   [place] Not Detected 12/18/2018     COVID-19 Medications: Steroids: 1/6>> Remdesivir: 1/6>> Convalescent Plasma: 1/7   Other  medications: Diuretics:Euvolemic-hold diuretics Antibiotics: Cefepime: 1/6>> Vancomycin: 1/6>>1/8  Prone/Incentive Spirometry: encouraged patient to lie prone for 3-4 hours at a time for a total of 16 hours a day, and to encourage incentive spirometry use 3-4/hour.  DVT Prophylaxis  : Xarelto  Persistent atrial fibrillation s/p AV node ablation and PPM insertion: Continue metoprolol-on Xarelto.  Rate under good control.  Paced rhythm on telemetry.  Note has history of amiodarone induced lung toxicity  Chronic combined systolic and diastolic heart failure: Euvolemic on exam-hold diuretics today.  Bronchial asthma: Appears stable-no rhonchi-continue bronchodilators.  AKI on CKD stage IIIa: AKI likely hemodynamically mediated-volume status is stable.  Creatinine slowly improving-follow.  HTN: BP controlled-continue metoprolol  DM-2: BG stable-continue SSI  CBG (last 3)  Recent Labs    07/03/19 1617 07/03/19 2031 07/04/19 0730  GLUCAP 142* 155* 226*   Hypothyroidism: Continue levothyroxine  History of amiodarone induced lung toxicity/ILD: Supportive care.  Consults  :  PCCM  Procedures  :  None  ABG:    Component Value Date/Time   PHART 7.427 05/15/2017 1158   PCO2ART 37.7 05/15/2017 1158   PO2ART 61.0 (L) 05/15/2017 1158   HCO3 24.9 05/15/2017 1158   TCO2 26 05/15/2017 1158   O2SAT 92.0 05/15/2017 1158    Vent Settings: N/A  Condition -stable  Family Communication  :  Brother-updated over the phone on 1/8  Code Status :  Full Code  Diet :  Diet Order            Diet heart healthy/carb modified Room service  appropriate? Yes; Fluid consistency: Thin  Diet effective now               Disposition Plan  :  Remain hospitalized  Barriers to discharge: Hypoxia requiring O2 supplementation/complete 5 days of IV Remdesivir  Antimicorbials  :    Anti-infectives (From admission, onward)   Start     Dose/Rate Route Frequency Ordered Stop   07/04/19 1830  vancomycin (VANCOREADY) IVPB 750 mg/150 mL     750 mg 150 mL/hr over 60 Minutes Intravenous Every 48 hours 07/03/19 1249     07/04/19 1800  vancomycin (VANCOCIN) IVPB 1000 mg/200 mL premix  Status:  Discontinued     1,000 mg 200 mL/hr over 60 Minutes Intravenous Every 48 hours 07/11/2019 1727 07/03/19 1249   07/03/19 1800  ceFEPIme (MAXIPIME) 1 g in sodium chloride 0.9 % 100 mL IVPB  Status:  Discontinued     1 g 200 mL/hr over 30 Minutes Intravenous Every 24 hours 07/15/2019 1725 07/03/19 1248   07/03/19 1800  ceFEPIme (MAXIPIME) 2 g in sodium chloride 0.9 % 100 mL IVPB     2 g 200 mL/hr over 30 Minutes Intravenous Every 24 hours 07/03/19 1247     07/03/19 1000  remdesivir 100 mg in sodium chloride 0.9 % 100 mL IVPB     100 mg 200 mL/hr over 30 Minutes Intravenous Daily 07/07/2019 1723 07/07/19 0959   07/03/19 1000  remdesivir 100 mg in sodium chloride 0.9 % 100 mL IVPB  Status:  Discontinued     100 mg 200 mL/hr over 30 Minutes Intravenous Daily 07/19/2019 2355 07/03/19 0257   07/06/2019 2354  remdesivir 200 mg in sodium chloride 0.9% 250 mL IVPB  Status:  Discontinued     200 mg 580 mL/hr over 30 Minutes Intravenous Once 07/22/2019 2355 07/03/19 0257   07/22/2019 1800  remdesivir 200 mg in sodium chloride 0.9% 250 mL IVPB     200  mg 580 mL/hr over 30 Minutes Intravenous Once 07/09/2019 1723 07/03/2019 2306   07/19/2019 1730  vancomycin (VANCOCIN) IVPB 1000 mg/200 mL premix  Status:  Discontinued     1,000 mg 200 mL/hr over 60 Minutes Intravenous  Once 07/16/2019 1722 07/13/2019 1725   07/03/2019 1730  ceFEPIme (MAXIPIME) 2 g in sodium chloride 0.9 % 100 mL IVPB      2 g 200 mL/hr over 30 Minutes Intravenous  Once 07/21/2019 1722 07/27/2019 1918   07/27/2019 1730  vancomycin (VANCOREADY) IVPB 1500 mg/300 mL     1,500 mg 150 mL/hr over 120 Minutes Intravenous  Once 07/11/2019 1725 07/07/2019 2036      Inpatient Medications  Scheduled Meds: . sodium chloride   Intravenous Once  . vitamin C  500 mg Oral Daily  . dexamethasone (DECADRON) injection  10 mg Intravenous Q24H  . fluticasone furoate-vilanterol  2 puff Inhalation Daily  . insulin aspart  0-15 Units Subcutaneous TID WC  . levothyroxine  100 mcg Oral QAC breakfast  . metoprolol succinate  50 mg Oral Daily  . pantoprazole  40 mg Oral BID  . pravastatin  10 mg Oral Daily  . Rivaroxaban  15 mg Oral Q breakfast  . zinc sulfate  220 mg Oral Daily   Continuous Infusions: . ceFEPime (MAXIPIME) IV Stopped (07/03/19 1910)  . remdesivir 100 mg in NS 100 mL 100 mg (07/04/19 1045)  . vancomycin     PRN Meds:.acetaminophen, albuterol, chlorpheniramine-HYDROcodone, guaiFENesin-dextromethorphan, senna-docusate   Time Spent in minutes  25  See all Orders from today for further details   Oren Binet M.D on 07/04/2019 at 11:04 AM  To page go to [email or web address] - use universal password  [facility] -  Office  [phone]    Objective:   Vitals:   07/04/19 0200 07/04/19 0400 07/04/19 0800 07/04/19 1043  BP: 132/81 106/73 126/81   Pulse: 70 (!) 28 74 70  Resp: (!) 24 (!) 23 (!) 25   Temp:  98.3 F (36.8 C) 97.9 F (36.6 C)   TempSrc:  Oral Oral   SpO2: 93% 92% 94%   Weight:      Height:        Wt Readings from Last 3 Encounters:  07/09/2019 59.9 kg  04/01/19 65.8 kg  04/01/19 64.4 kg     Intake/Output Summary (Last 24 hours) at 07/04/2019 1104 Last data filed at 07/04/2019 0900 Gross per 24 hour  Intake 991 ml  Output --  Net 991 ml     Physical Exam Gen Exam:Alert awake-not in any distress HEENT:atraumatic, normocephalic Chest: B/L clear to auscultation  anteriorly CVS:S1S2 regular Abdomen:soft non tender, non distended Extremities:no edema Neurology: Non focal Skin: no rash   Data Review:    CBC Recent Labs  Lab 07/01/2019 1454 07/03/19 0804 07/04/19 0455  WBC 7.3 4.1 8.2  HGB 14.4 14.4 14.6  HCT 45.0 47.0* 45.5  PLT 267 236 288  MCV 97.4 99.8 96.0  MCH 31.2 30.6 30.8  MCHC 32.0 30.6 32.1  RDW 13.5 13.3 13.6  LYMPHSABS 0.6* 0.5* 0.6*  MONOABS 0.3 0.1 0.4  EOSABS 0.0 0.0 0.0  BASOSABS 0.0 0.0 0.0    Chemistries  Recent Labs  Lab 07/07/2019 1454 07/03/19 0804 07/04/19 0455  NA 136 140 142  K 3.6 4.3 4.1  CL 105 108 107  CO2 16* 18* 21*  GLUCOSE 146* 168* 230*  BUN 52* 59* 76*  CREATININE 2.43* 2.29* 2.33*  CALCIUM 8.5* 8.3* 8.6*  AST  69* 56* 42*  ALT 25 23 26   ALKPHOS 104 89 95  BILITOT 1.0 0.9 0.8   ------------------------------------------------------------------------------------------------------------------ Recent Labs    07/04/2019 1454  TRIG 127    No results found for: HGBA1C ------------------------------------------------------------------------------------------------------------------ No results for input(s): TSH, T4TOTAL, T3FREE, THYROIDAB in the last 72 hours.  Invalid input(s): FREET3 ------------------------------------------------------------------------------------------------------------------ Recent Labs    07/16/2019 1454  FERRITIN 3,926*    Coagulation profile No results for input(s): INR, PROTIME in the last 168 hours.  Recent Labs    07/03/19 0804 07/04/19 0455  DDIMER 3.76* 4.37*    Cardiac Enzymes No results for input(s): CKMB, TROPONINI, MYOGLOBIN in the last 168 hours.  Invalid input(s): CK ------------------------------------------------------------------------------------------------------------------    Component Value Date/Time   BNP 1,321.0 (H) 07/18/2019 1455    Micro Results Recent Results (from the past 240 hour(s))  Novel Coronavirus, NAA ([facility])      Status: Abnormal   Collection Time: 06/26/19 10:17 AM   Specimen: Nasopharyngeal(NP) swabs in vial transport medium   NASOPHARYNGE  TESTING  Result Value Ref Range Status   SARS-CoV-2, NAA Detected (A) Not Detected Final    Comment: This nucleic acid amplification test was developed and its performance characteristics determined by [employer]. Nucleic acid amplification tests include PCR and TMA. This test has not been FDA cleared or approved. This test has been authorized by FDA under an Emergency Use Authorization (EUA). This test is only authorized for the duration of time the declaration that circumstances exist justifying the authorization of the emergency use of in vitro diagnostic tests for detection of SARS-CoV-2 virus and/or diagnosis of COVID-19 infection under section 564(b)(1) of the Act, 21 U.S.C. [id number]) (1), unless the authorization is terminated or revoked sooner. When diagnostic testing is negative, the possibility of a false negative result should be considered in the context of a patient's recent exposures and the presence of clinical signs and symptoms consistent with COVID-19. An individual without symptoms of COVID-19 and who is not shedding SARS-CoV-2 virus would  expect to have a negative (not detected) result in this assay.   Blood Culture (routine x 2)     Status: None (Preliminary result)   Collection Time: 07/17/2019  2:54 PM   Specimen: Left Antecubital; Blood  Result Value Ref Range Status   Specimen Description LEFT ANTECUBITAL  Final   Special Requests   Final    BOTTLES DRAWN AEROBIC AND ANAEROBIC Blood Culture adequate volume   Culture   Final    NO GROWTH 2 DAYS Performed at [facility], [address], [place], [place] [address]    Report Status PENDING  Incomplete  Blood Culture (routine x 2)     Status: None (Preliminary result)   Collection Time: 07/01/2019  4:34 PM   Specimen: BLOOD LEFT ARM  Result Value Ref Range Status    Specimen Description BLOOD LEFT ARM  Final   Special Requests   Final    BOTTLES DRAWN AEROBIC AND ANAEROBIC Blood Culture adequate volume   Culture   Final    NO GROWTH 2 DAYS Performed at [facility], [address], [place], [place] [address]    Report Status PENDING  Incomplete    Radiology Reports DG Chest Port 1 View  Result Date: 07/03/2019 CLINICAL DATA:  COVID-19 positive.  Worsening shortness of breath. EXAM: PORTABLE CHEST 1 VIEW COMPARISON:  07/09/2019. FINDINGS: Surgical clips noted over the neck. AICD in stable position. Stable cardiomegaly. Diffuse severe bilateral pulmonary infiltrates again noted. Slight improvement  in aeration on today's exam. No pleural effusion pneumothorax. Old infarct proximal right humerus. No acute bony abnormality. IMPRESSION: 1.  AICD in stable position.  Stable cardiomegaly. 2. Diffuse severe bilateral pulmonary infiltrates again noted. Slight improvement in aeration on today's exam. Electronically Signed   By: Marcello Moores  Register   On: 07/03/2019 07:15   DG Chest Port 1 View  Result Date: 07/20/2019 CLINICAL DATA:  Hypoxia. EXAM: PORTABLE CHEST 1 VIEW COMPARISON:  None. FINDINGS: There is a dual lead AICD. Moderate severity diffuse bilateral infiltrates are seen. There is a small right pleural effusion. No pneumothorax is identified. The cardiac silhouette is markedly enlarged. Multiple radiopaque surgical clips are seen overlying the superior mediastinum. There is marked severity levoscoliosis of the thoracic spine with moderate severity multilevel degenerative changes. IMPRESSION: 1. Moderate severity diffuse bilateral infiltrates with a small right pleural effusion. 2. Marked enlargement of the cardiac silhouette. 3. Dual lead AICD in place. Electronically Signed   By: Virgina Norfolk M.D.   On: 07/20/2019 15:50   MM 3D SCREEN BREAST BILATERAL  Result Date: 06/12/2019 CLINICAL DATA:  Screening. EXAM: DIGITAL SCREENING BILATERAL MAMMOGRAM WITH TOMO  AND CAD COMPARISON:  Previous exam(s). ACR Breast Density Category a: The breast tissue is almost entirely fatty. FINDINGS: There are no findings suspicious for malignancy. Images were processed with CAD. IMPRESSION: No mammographic evidence of malignancy. A result letter of this screening mammogram will be mailed directly to the patient. RECOMMENDATION: Screening mammogram in one year. (Code:SM-B-01Y) BI-RADS CATEGORY  1: Negative. Electronically Signed   By: Nolon Nations M.D.   On: 06/12/2019 16:42

## 2019-07-04 NOTE — Progress Notes (Signed)
[name] is having a good day. She has gotten up to the chair and her SPO2 has maintained above 90% on 5lpm via HFNC - plan for continued reduction of O2 as per her tolerance. She worked with PT today (see note) and is performing lung expansion exercises as ordered. I have continued to encourage her throughout today. She completed her plasma early around 1 AM, and had her day 3 of 5 dose remdesivir at 10 today. As per Dr. Sloan Leiter orders she has concluded her vancomycin but will remain on steroid therapy. I will continue to help her with understand the importance of prone positioning for this afternoon and tonight.

## 2019-07-05 LAB — CBC WITH DIFFERENTIAL/PLATELET
Abs Immature Granulocytes: 0.22 10*3/uL — ABNORMAL HIGH (ref 0.00–0.07)
Basophils Absolute: 0 10*3/uL (ref 0.0–0.1)
Basophils Relative: 0 %
Eosinophils Absolute: 0 10*3/uL (ref 0.0–0.5)
Eosinophils Relative: 0 %
HCT: 47.6 % — ABNORMAL HIGH (ref 36.0–46.0)
Hemoglobin: 15.3 g/dL — ABNORMAL HIGH (ref 12.0–15.0)
Immature Granulocytes: 2 %
Lymphocytes Relative: 4 %
Lymphs Abs: 0.5 10*3/uL — ABNORMAL LOW (ref 0.7–4.0)
MCH: 30.6 pg (ref 26.0–34.0)
MCHC: 32.1 g/dL (ref 30.0–36.0)
MCV: 95.2 fL (ref 80.0–100.0)
Monocytes Absolute: 0.3 10*3/uL (ref 0.1–1.0)
Monocytes Relative: 2 %
Neutro Abs: 12.6 10*3/uL — ABNORMAL HIGH (ref 1.7–7.7)
Neutrophils Relative %: 92 %
Platelets: 335 10*3/uL (ref 150–400)
RBC: 5 MIL/uL (ref 3.87–5.11)
RDW: 13.7 % (ref 11.5–15.5)
WBC: 13.7 10*3/uL — ABNORMAL HIGH (ref 4.0–10.5)
nRBC: 0.4 % — ABNORMAL HIGH (ref 0.0–0.2)

## 2019-07-05 LAB — D-DIMER, QUANTITATIVE: D-Dimer, Quant: 4.22 ug/mL-FEU — ABNORMAL HIGH (ref 0.00–0.50)

## 2019-07-05 LAB — COMPREHENSIVE METABOLIC PANEL
ALT: 25 U/L (ref 0–44)
AST: 35 U/L (ref 15–41)
Albumin: 2.7 g/dL — ABNORMAL LOW (ref 3.5–5.0)
Alkaline Phosphatase: 105 U/L (ref 38–126)
Anion gap: 13 (ref 5–15)
BUN: 77 mg/dL — ABNORMAL HIGH (ref 8–23)
CO2: 21 mmol/L — ABNORMAL LOW (ref 22–32)
Calcium: 8.9 mg/dL (ref 8.9–10.3)
Chloride: 106 mmol/L (ref 98–111)
Creatinine, Ser: 1.96 mg/dL — ABNORMAL HIGH (ref 0.44–1.00)
GFR calc Af Amer: 31 mL/min — ABNORMAL LOW (ref >60)
GFR calc non Af Amer: 27 mL/min — ABNORMAL LOW (ref >60)
Glucose, Bld: 323 mg/dL — ABNORMAL HIGH (ref 70–99)
Potassium: 4.1 mmol/L (ref 3.5–5.1)
Sodium: 140 mmol/L (ref 135–145)
Total Bilirubin: 1 mg/dL (ref 0.3–1.2)
Total Protein: 6.5 g/dL (ref 6.5–8.1)

## 2019-07-05 LAB — GLUCOSE, CAPILLARY
Glucose-Capillary: 325 mg/dL — ABNORMAL HIGH (ref 70–99)
Glucose-Capillary: 319 mg/dL — ABNORMAL HIGH (ref 70–99)
Glucose-Capillary: 212 mg/dL — ABNORMAL HIGH (ref 70–99)
Glucose-Capillary: 127 mg/dL — ABNORMAL HIGH (ref 70–99)

## 2019-07-05 LAB — C-REACTIVE PROTEIN: CRP: 9.7 mg/dL — ABNORMAL HIGH (ref <1.0)

## 2019-07-05 MED ORDER — INSULIN ASPART 100 UNIT/ML ~~LOC~~ SOLN
0.0000 [IU] | Freq: Three times a day (TID) | SUBCUTANEOUS | Status: DC
  Administered 2019-07-05: 17:00:00 7 [IU] via SUBCUTANEOUS
  Administered 2019-07-06: 4 [IU] via SUBCUTANEOUS
  Administered 2019-07-06: 18:00:00 3 [IU] via SUBCUTANEOUS
  Administered 2019-07-06: 12:00:00 4 [IU] via SUBCUTANEOUS
  Administered 2019-07-08: 12:00:00 15 [IU] via SUBCUTANEOUS
  Administered 2019-07-08: 11 [IU] via SUBCUTANEOUS
  Administered 2019-07-09: 7 [IU] via SUBCUTANEOUS
  Administered 2019-07-09: 3 [IU] via SUBCUTANEOUS
  Administered 2019-07-09: 4 [IU] via SUBCUTANEOUS
  Administered 2019-07-10: 11 [IU] via SUBCUTANEOUS
  Administered 2019-07-10: 7 [IU] via SUBCUTANEOUS

## 2019-07-05 MED ORDER — INSULIN ASPART 100 UNIT/ML ~~LOC~~ SOLN
4.0000 [IU] | Freq: Three times a day (TID) | SUBCUTANEOUS | Status: DC
  Administered 2019-07-05 – 2019-07-10 (×14): 4 [IU] via SUBCUTANEOUS

## 2019-07-05 MED ORDER — INSULIN GLARGINE 100 UNIT/ML ~~LOC~~ SOLN
15.0000 [IU] | Freq: Every day | SUBCUTANEOUS | Status: DC
  Administered 2019-07-05: 15 [IU] via SUBCUTANEOUS
  Filled 2019-07-05 (×3): qty 0.15

## 2019-07-05 NOTE — Progress Notes (Signed)
Notified by Telemetry that patient had 3 beats of VTach. Patient sleeping.

## 2019-07-05 NOTE — Progress Notes (Signed)
PROGRESS NOTE                                                                                                                                                                                                             Patient Demographics:    [name], is a 64 y.o. female, DOB - 1955-07-13, [id number]  Outpatient Primary MD for the patient is Celene Squibb, MD   Admit date - 07/06/2019   LOS - 3  Chief Complaint  Patient presents with  . Shortness of Breath  . covid pos       Brief Narrative: Patient is a 64 y.o. 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  Post admission-she had significant worsening of her hypoxemia-requiring 100% NRB-she was then transferred to [facility].  See below for further details.   Subjective:    Cairra Badolato continues to improve-no major events overnight-on 3 L of oxygen (on 15 L on admission)   Assessment  & Plan :   Acute Hypoxic Resp Failure due to Covid 19 Viral pneumonia and concurrent bacterial pneumonia: Improved-down to 3 L of oxygen.  CRP continues to downtrend-continue steroids/remdesivir and empiric cefepime.     Fever: afebrile  O2 requirements:  SpO2: 99 % O2 Flow Rate (L/min): 3 L/min   COVID-19 Labs: Recent Labs    07/03/19 0804 07/04/19 0455 07/05/19 0530  DDIMER 3.76* 4.37* 4.22*  CRP 27.3* 17.8* 9.7*       Component Value Date/Time   BNP 1,321.0 (H) 07/13/2019 1455    Recent Labs  Lab 07/11/2019 1454  PROCALCITON 6.29    Lab Results    Component Value Date   SARSCOV2NAA Detected (A) 06/26/2019   Gross NEGATIVE 02/20/2019   Railroad Not Detected 12/18/2018     COVID-19 Medications: Steroids: 1/6>> Remdesivir: 1/6>> Convalescent Plasma: 1/7   Other medications: Diuretics:Euvolemic-hold diuretics Antibiotics: Cefepime: 1/6>> Vancomycin: 1/6>>1/8  Prone/Incentive Spirometry: encouraged patient to lie prone for 3-4 hours at a time for a total of 16 hours a  day, and to encourage incentive spirometry use 3-4/hour.  DVT Prophylaxis  : Xarelto  Persistent atrial fibrillation s/p AV node ablation and PPM insertion: Continue metoprolol-on Xarelto.  Rate under good control.  Paced rhythm on telemetry.  Note has history of amiodarone induced lung toxicity  Chronic combined systolic and diastolic heart failure: Euvolemic on exam-hold diuretics today.  Bronchial asthma: Appears stable-no rhonchi-continue bronchodilators.  AKI on CKD stage IIIa: AKI likely hemodynamically mediated-volume status is stable.  Creatinine slowly improving-follow.  HTN: BP controlled-continue metoprolol  DM-2: CBGs elevated-likely secondary to steroids-start Lantus 15 units daily 4 units of NovoLog with meals-and change SSI to resistant scale.  A1c pending  CBG (last 3)  Recent Labs    07/04/19 2030 07/05/19 0741 07/05/19 1146  GLUCAP 255* 325* 319*   Hypothyroidism: Continue levothyroxine  History of amiodarone induced lung toxicity/ILD: Supportive care.  Consults  :  PCCM  Procedures  :  None  ABG:    Component Value Date/Time   PHART 7.427 05/15/2017 1158   PCO2ART 37.7 05/15/2017 1158   PO2ART 61.0 (L) 05/15/2017 1158   HCO3 24.9 05/15/2017 1158   TCO2 26 05/15/2017 1158   O2SAT 92.0 05/15/2017 1158    Vent Settings: N/A  Condition -stable  Family Communication  :  Brother-updated over the phone on 1/9  Code Status :  Full Code  Diet :  Diet Order            Diet heart healthy/carb modified Room  service appropriate? Yes; Fluid consistency: Thin  Diet effective now               Disposition Plan  :  Remain hospitalized-Home with home health services when she is ready for discharge.  Barriers to discharge: Hypoxia requiring O2 supplementation/complete 5 days of IV Remdesivir  Antimicorbials  :    Anti-infectives (From admission, onward)   Start     Dose/Rate Route Frequency Ordered Stop   07/04/19 1830  vancomycin (VANCOREADY) IVPB 750 mg/150 mL  Status:  Discontinued     750 mg 150 mL/hr over 60 Minutes Intravenous Every 48 hours 07/03/19 1249 07/04/19 1110   07/04/19 1800  vancomycin (VANCOCIN) IVPB 1000 mg/200 mL premix  Status:  Discontinued     1,000 mg 200 mL/hr over 60 Minutes Intravenous Every 48 hours 07/20/2019 1727 07/03/19 1249   07/03/19 1800  ceFEPIme (MAXIPIME) 1 g in sodium chloride 0.9 % 100 mL IVPB  Status:  Discontinued     1 g 200 mL/hr over 30 Minutes Intravenous Every 24 hours 07/07/2019 1725 07/03/19 1248   07/03/19 1800  ceFEPIme (MAXIPIME) 2 g in sodium chloride 0.9 % 100 mL IVPB     2 g 200 mL/hr over 30 Minutes Intravenous Every 24 hours 07/03/19 1247     07/03/19 1000  remdesivir 100 mg in sodium chloride 0.9 % 100 mL IVPB     100 mg 200 mL/hr over 30 Minutes Intravenous Daily 06/27/2019 1723 07/07/19 0959   07/03/19 1000  remdesivir 100 mg in sodium chloride 0.9 % 100 mL IVPB  Status:  Discontinued     100 mg 200 mL/hr over 30 Minutes Intravenous Daily 07/27/2019 2355 07/03/19 0257   07/26/2019 2354  remdesivir 200 mg in sodium chloride 0.9% 250 mL IVPB  Status:  Discontinued     200 mg 580 mL/hr over 30 Minutes Intravenous Once 07/17/2019 2355 07/03/19 0257   07/01/2019 1800  remdesivir 200 mg in sodium chloride 0.9% 250 mL IVPB  200 mg 580 mL/hr over 30 Minutes Intravenous Once 07/26/2019 1723 07/24/2019 2306   07/14/2019 1730  vancomycin (VANCOCIN) IVPB 1000 mg/200 mL premix  Status:  Discontinued     1,000 mg 200 mL/hr over 60 Minutes Intravenous  Once  07/26/2019 1722 07/18/2019 1725   07/01/2019 1730  ceFEPIme (MAXIPIME) 2 g in sodium chloride 0.9 % 100 mL IVPB     2 g 200 mL/hr over 30 Minutes Intravenous  Once 07/18/2019 1722 07/25/2019 1918   07/12/2019 1730  vancomycin (VANCOREADY) IVPB 1500 mg/300 mL     1,500 mg 150 mL/hr over 120 Minutes Intravenous  Once 06/30/2019 1725 06/27/2019 2036      Inpatient Medications  Scheduled Meds: . sodium chloride   Intravenous Once  . vitamin C  500 mg Oral Daily  . dexamethasone (DECADRON) injection  10 mg Intravenous Q24H  . fluticasone furoate-vilanterol  2 puff Inhalation Daily  . insulin aspart  0-15 Units Subcutaneous TID WC  . levothyroxine  100 mcg Oral QAC breakfast  . metoprolol succinate  50 mg Oral Daily  . pantoprazole  40 mg Oral BID  . pravastatin  10 mg Oral Daily  . Rivaroxaban  15 mg Oral Q breakfast  . zinc sulfate  220 mg Oral Daily   Continuous Infusions: . ceFEPime (MAXIPIME) IV Stopped (07/04/19 1754)  . remdesivir 100 mg in NS 100 mL 100 mg (07/05/19 1028)   PRN Meds:.acetaminophen, albuterol, chlorpheniramine-HYDROcodone, guaiFENesin-dextromethorphan, senna-docusate   Time Spent in minutes  25  See all Orders from today for further details   Oren Binet M.D on 07/05/2019 at 2:56 PM  To page go to [email or web address] - use universal password  [facility] -  Office  [phone]    Objective:   Vitals:   07/05/19 0400 07/05/19 0401 07/05/19 0800 07/05/19 1100  BP: 139/86  (!) 134/92 129/86  Pulse: 72 72 73 70  Resp: (!) 26 (!) 24 (!) 26 (!) 26  Temp: (!) 97.1 F (36.2 C)  97.9 F (36.6 C) (!) 97.5 F (36.4 C)  TempSrc: Axillary  Axillary Axillary  SpO2: 97% 97% 92% 99%  Weight:      Height:        Wt Readings from Last 3 Encounters:  07/24/2019 59.9 kg  04/01/19 65.8 kg  04/01/19 64.4 kg     Intake/Output Summary (Last 24 hours) at 07/05/2019 1456 Last data filed at 07/05/2019 0000 Gross per 24 hour  Intake 1000 ml  Output 500 ml  Net 500 ml      Physical Exam Gen Exam:Alert awake-not in any distress HEENT:atraumatic, normocephalic Chest: B/L clear to auscultation anteriorly CVS:S1S2 regular Abdomen:soft non tender, non distended Extremities:no edema Neurology: Non focal Skin: no rash   Data Review:    CBC Recent Labs  Lab 07/11/2019 1454 07/03/19 0804 07/04/19 0455 07/05/19 0530  WBC 7.3 4.1 8.2 13.7*  HGB 14.4 14.4 14.6 15.3*  HCT 45.0 47.0* 45.5 47.6*  PLT 267 236 288 335  MCV 97.4 99.8 96.0 95.2  MCH 31.2 30.6 30.8 30.6  MCHC 32.0 30.6 32.1 32.1  RDW 13.5 13.3 13.6 13.7  LYMPHSABS 0.6* 0.5* 0.6* 0.5*  MONOABS 0.3 0.1 0.4 0.3  EOSABS 0.0 0.0 0.0 0.0  BASOSABS 0.0 0.0 0.0 0.0    Chemistries  Recent Labs  Lab 07/13/2019 1454 07/03/19 0804 07/04/19 0455 07/05/19 0530  NA 136 140 142 140  K 3.6 4.3 4.1 4.1  CL 105 108 107 106  CO2 16* 18* 21*  21*  GLUCOSE 146* 168* 230* 323*  BUN 52* 59* 76* 77*  CREATININE 2.43* 2.29* 2.33* 1.96*  CALCIUM 8.5* 8.3* 8.6* 8.9  AST 69* 56* 42* 35  ALT 25 23 26 25   ALKPHOS 104 89 95 105  BILITOT 1.0 0.9 0.8 1.0   ------------------------------------------------------------------------------------------------------------------ No results for input(s): CHOL, HDL, LDLCALC, TRIG, CHOLHDL, LDLDIRECT in the last 72 hours.  No results found for: HGBA1C ------------------------------------------------------------------------------------------------------------------ No results for input(s): TSH, T4TOTAL, T3FREE, THYROIDAB in the last 72 hours.  Invalid input(s): FREET3 ------------------------------------------------------------------------------------------------------------------ No results for input(s): VITAMINB12, FOLATE, FERRITIN, TIBC, IRON, RETICCTPCT in the last 72 hours.  Coagulation profile No results for input(s): INR, PROTIME in the last 168 hours.  Recent Labs    07/04/19 0455 07/05/19 0530  DDIMER 4.37* 4.22*    Cardiac Enzymes No results for  input(s): CKMB, TROPONINI, MYOGLOBIN in the last 168 hours.  Invalid input(s): CK ------------------------------------------------------------------------------------------------------------------    Component Value Date/Time   BNP 1,321.0 (H) 07/21/2019 1455    Micro Results Recent Results (from the past 240 hour(s))  Novel Coronavirus, NAA ([facility])     Status: Abnormal   Collection Time: 06/26/19 10:17 AM   Specimen: Nasopharyngeal(NP) swabs in vial transport medium   NASOPHARYNGE  TESTING  Result Value Ref Range Status   SARS-CoV-2, NAA Detected (A) Not Detected Final    Comment: This nucleic acid amplification test was developed and its performance characteristics determined by [employer]. Nucleic acid amplification tests include PCR and TMA. This test has not been FDA cleared or approved. This test has been authorized by FDA under an Emergency Use Authorization (EUA). This test is only authorized for the duration of time the declaration that circumstances exist justifying the authorization of the emergency use of in vitro diagnostic tests for detection of SARS-CoV-2 virus and/or diagnosis of COVID-19 infection under section 564(b)(1) of the Act, 21 U.S.C. [id number]) (1), unless the authorization is terminated or revoked sooner. When diagnostic testing is negative, the possibility of a false negative result should be considered in the context of a patient's recent exposures and the presence of clinical signs and symptoms consistent with COVID-19. An individual without symptoms of COVID-19 and who is not shedding SARS-CoV-2 virus would  expect to have a negative (not detected) result in this assay.   Blood Culture (routine x 2)     Status: None (Preliminary result)   Collection Time: 07/01/2019  2:54 PM   Specimen: Left Antecubital; Blood  Result Value Ref Range Status   Specimen Description LEFT ANTECUBITAL  Final   Special Requests   Final    BOTTLES DRAWN  AEROBIC AND ANAEROBIC Blood Culture adequate volume   Culture   Final    NO GROWTH 3 DAYS Performed at [facility], [address], [place], [place] [address]    Report Status PENDING  Incomplete  Blood Culture (routine x 2)     Status: None (Preliminary result)   Collection Time: 07/03/2019  4:34 PM   Specimen: BLOOD LEFT ARM  Result Value Ref Range Status   Specimen Description BLOOD LEFT ARM  Final   Special Requests   Final    BOTTLES DRAWN AEROBIC AND ANAEROBIC Blood Culture adequate volume   Culture   Final    NO GROWTH 3 DAYS Performed at [facility], [address], [place], [place] [address]    Report Status PENDING  Incomplete    Radiology Reports DG Chest Port 1 View  Result Date: 07/03/2019 CLINICAL DATA:  COVID-19 positive.  Worsening shortness of breath. EXAM: PORTABLE CHEST 1 VIEW COMPARISON:  07/07/2019. FINDINGS: Surgical clips noted over the neck. AICD in stable position. Stable cardiomegaly. Diffuse severe bilateral pulmonary infiltrates again noted. Slight improvement in aeration on today's exam. No pleural effusion pneumothorax. Old infarct proximal right humerus. No acute bony abnormality. IMPRESSION: 1.  AICD in stable position.  Stable cardiomegaly. 2. Diffuse severe bilateral pulmonary infiltrates again noted. Slight improvement in aeration on today's exam. Electronically Signed   By: Marcello Moores  Register   On: 07/03/2019 07:15   DG Chest Port 1 View  Result Date: 07/23/2019 CLINICAL DATA:  Hypoxia. EXAM: PORTABLE CHEST 1 VIEW COMPARISON:  None. FINDINGS: There is a dual lead AICD. Moderate severity diffuse bilateral infiltrates are seen. There is a small right pleural effusion. No pneumothorax is identified. The cardiac silhouette is markedly enlarged. Multiple radiopaque surgical clips are seen overlying the superior mediastinum. There is marked severity levoscoliosis of the thoracic spine with moderate severity multilevel degenerative changes. IMPRESSION: 1. Moderate  severity diffuse bilateral infiltrates with a small right pleural effusion. 2. Marked enlargement of the cardiac silhouette. 3. Dual lead AICD in place. Electronically Signed   By: Virgina Norfolk M.D.   On: 07/13/2019 15:50   MM 3D SCREEN BREAST BILATERAL  Result Date: 06/12/2019 CLINICAL DATA:  Screening. EXAM: DIGITAL SCREENING BILATERAL MAMMOGRAM WITH TOMO AND CAD COMPARISON:  Previous exam(s). ACR Breast Density Category a: The breast tissue is almost entirely fatty. FINDINGS: There are no findings suspicious for malignancy. Images were processed with CAD. IMPRESSION: No mammographic evidence of malignancy. A result letter of this screening mammogram will be mailed directly to the patient. RECOMMENDATION: Screening mammogram in one year. (Code:SM-B-01Y) BI-RADS CATEGORY  1: Negative. Electronically Signed   By: Nolon Nations M.D.   On: 06/12/2019 16:42

## 2019-07-06 ENCOUNTER — Inpatient Hospital Stay (HOSPITAL_COMMUNITY): Payer: PPO

## 2019-07-06 LAB — D-DIMER, QUANTITATIVE: D-Dimer, Quant: 4.18 ug/mL-FEU — ABNORMAL HIGH (ref 0.00–0.50)

## 2019-07-06 LAB — CBC WITH DIFFERENTIAL/PLATELET
Abs Immature Granulocytes: 0.28 10*3/uL — ABNORMAL HIGH (ref 0.00–0.07)
Basophils Absolute: 0.1 10*3/uL (ref 0.0–0.1)
Basophils Relative: 1 %
Eosinophils Absolute: 0 10*3/uL (ref 0.0–0.5)
Eosinophils Relative: 0 %
HCT: 47.3 % — ABNORMAL HIGH (ref 36.0–46.0)
Hemoglobin: 15.5 g/dL — ABNORMAL HIGH (ref 12.0–15.0)
Immature Granulocytes: 2 %
Lymphocytes Relative: 4 %
Lymphs Abs: 0.6 10*3/uL — ABNORMAL LOW (ref 0.7–4.0)
MCH: 30.7 pg (ref 26.0–34.0)
MCHC: 32.8 g/dL (ref 30.0–36.0)
MCV: 93.7 fL (ref 80.0–100.0)
Monocytes Absolute: 0.4 10*3/uL (ref 0.1–1.0)
Monocytes Relative: 2 %
Neutro Abs: 13.8 10*3/uL — ABNORMAL HIGH (ref 1.7–7.7)
Neutrophils Relative %: 91 %
Platelets: 291 10*3/uL (ref 150–400)
RBC: 5.05 MIL/uL (ref 3.87–5.11)
RDW: 13.6 % (ref 11.5–15.5)
WBC: 15.1 10*3/uL — ABNORMAL HIGH (ref 4.0–10.5)
nRBC: 0.2 % (ref 0.0–0.2)

## 2019-07-06 LAB — COMPREHENSIVE METABOLIC PANEL
ALT: 24 U/L (ref 0–44)
AST: 33 U/L (ref 15–41)
Albumin: 2.5 g/dL — ABNORMAL LOW (ref 3.5–5.0)
Alkaline Phosphatase: 110 U/L (ref 38–126)
Anion gap: 10 (ref 5–15)
BUN: 72 mg/dL — ABNORMAL HIGH (ref 8–23)
CO2: 20 mmol/L — ABNORMAL LOW (ref 22–32)
Calcium: 9.2 mg/dL (ref 8.9–10.3)
Chloride: 111 mmol/L (ref 98–111)
Creatinine, Ser: 1.52 mg/dL — ABNORMAL HIGH (ref 0.44–1.00)
GFR calc Af Amer: 42 mL/min — ABNORMAL LOW (ref >60)
GFR calc non Af Amer: 36 mL/min — ABNORMAL LOW (ref >60)
Glucose, Bld: 108 mg/dL — ABNORMAL HIGH (ref 70–99)
Potassium: 4.3 mmol/L (ref 3.5–5.1)
Sodium: 141 mmol/L (ref 135–145)
Total Bilirubin: 1 mg/dL (ref 0.3–1.2)
Total Protein: 6 g/dL — ABNORMAL LOW (ref 6.5–8.1)

## 2019-07-06 LAB — GLUCOSE, CAPILLARY
Glucose-Capillary: 152 mg/dL — ABNORMAL HIGH (ref 70–99)
Glucose-Capillary: 162 mg/dL — ABNORMAL HIGH (ref 70–99)
Glucose-Capillary: 157 mg/dL — ABNORMAL HIGH (ref 70–99)
Glucose-Capillary: 121 mg/dL — ABNORMAL HIGH (ref 70–99)
Glucose-Capillary: 97 mg/dL (ref 70–99)

## 2019-07-06 LAB — HEMOGLOBIN A1C
Hgb A1c MFr Bld: 6.8 % — ABNORMAL HIGH (ref 4.8–5.6)
Mean Plasma Glucose: 148.46 mg/dL

## 2019-07-06 LAB — C-REACTIVE PROTEIN: CRP: 4.9 mg/dL — ABNORMAL HIGH (ref <1.0)

## 2019-07-06 MED ORDER — FUROSEMIDE 10 MG/ML IJ SOLN
80.0000 mg | Freq: Once | INTRAMUSCULAR | Status: AC
  Administered 2019-07-06: 80 mg via INTRAVENOUS
  Filled 2019-07-06: qty 8

## 2019-07-06 NOTE — Progress Notes (Signed)
Got patient up to the [facility] d/t having to urinate. The patient kept desating to the 26s and 70s. I instructed the patient to relax and breathe, in through her nose and out through her mouth. She would then sat in the upper 80s to 90s but then would desat again. I would then remind her again and the process repeats the whole time she is on the [facility]. She sat on the [facility] over 30 minutes trying to urinate.

## 2019-07-06 NOTE — Discharge Instructions (Signed)

## 2019-07-06 NOTE — Progress Notes (Signed)
Occupational Therapy Evaluation Patient Details Name: [name] MRN: [id number] DOB: Mar 29, 1956 Today's Date: 07/06/2019    History of Present Illness 64 y.o. female, PMHx including amiodarone toxicity, interstitial lung disease, chronic systolic/diastolic heart failure, GERD,  history of congenital heart disease, DM II, iron deficient anemia, hypertension,hyperparathyroidism, peripheral vascular disease, renal insufficiency, persistent A. fib anticoagulated on Xarelto presented to ED w/ shortness of breath, patient was recently diagnosed 12/31 with COVID-19, at baseline has no oxygen requirement, patient reported her symptoms started on 1/3, with progressive dyspnea, fever, chills, cough, neurolyse body ache, denies nausea, vomiting, diarrhea, chest pain, leg edema, or orthopnea. in ED was febrile 103, hypoxic in the mid 80s, requiring initially 6 L nasal cannula, chest x-ray significant for diffuse bilateral opacities, pt admited for COVID-19 for pneumonia.   Clinical Impression   PTA pt lived alone with brother residing next door. Pt reports being independent in ADL, mobility, laundry, meal prep, and cleaning tasks with brother assisting with grocery shopping. Pt ambulates with a cane and reports 0 falls in the last 6 months. Pt does not use oxygen at home with pt currently on 8L HFNC at the hospital. Pt currently requires setup to mod assist for self-care and functional transfer tasks. Pt tolerated sitting edge of bed ~3 min before getting back into bed without warning due to fatigue. Continued to provide encouragement and education on the importance of daily activity as pt had not been up to the chair at all this date (therapist saw her in the afternoon). Pt required mod verbal and tactile cues to initiate bed mobility task. Pt able to transfer to bedside chair with min hand held assist and increased time. SpO2 decreased to low 80s with activity, with pt requiring 10+ min seated recovery with  oxygen titrated up to 11L HFNC. SpO2 at 91% on 11L HFNC at end of session. 3/4 DOE. RN updated. Pt demonstrates decreased strength, endurance, balance, standing tolerance, activity tolerance, and safety awareness impacting ability to complete self-care and functional transfer tasks. Recommend skilled OT services to address above deficits in order to promote function and prevent further decline. Recommend SNF placement for additional rehab prior to discharge home.     Follow Up Recommendations  SNF(Depending on pt's progress in therapy.)    Equipment Recommendations  Other (comment)(TBD)    Recommendations for Other Services       Precautions / Restrictions Precautions Precautions: Fall;Other (comment) Precaution Comments: Monitor oxygen Restrictions Weight Bearing Restrictions: No      Mobility Bed Mobility Overal bed mobility: Needs Assistance Bed Mobility: Supine to Sit;Sit to Supine     Supine to sit: Min assist;HOB elevated Sit to supine: Supervision;HOB elevated   General bed mobility comments: Pt required assist and cues to initiate task. Pt utilized bedrail. Pt returned to supine without warning due to fatigue.   Transfers Overall transfer level: Needs assistance Equipment used: 1 person hand held assist Transfers: Sit to/from [facility] Sit to Stand: Min assist Stand pivot transfers: Min assist       General transfer comment: Pt able to stand pivot transfer to bedside chair with hand held assist.    Balance Overall balance assessment: Needs assistance   Sitting balance-Leahy Scale: Fair       Standing balance-Leahy Scale: Fair                             ADL either performed or assessed with clinical judgement  ADL Overall ADL's : Needs assistance/impaired Eating/Feeding: Set up;Sitting   Grooming: Set up;Sitting   Upper Body Bathing: Minimal assistance;Sitting   Lower Body Bathing: Moderate assistance;Sit to/from  stand;Sitting/lateral leans   Upper Body Dressing : Minimal assistance;Sitting   Lower Body Dressing: Moderate assistance;Sitting/lateral leans;Sit to/from stand   Toilet Transfer: Minimal assistance;BSC   Toileting- Clothing Manipulation and Hygiene: Moderate assistance;Sit to/from stand;Sitting/lateral lean       Functional mobility during ADLs: Minimal assistance(hand held assist) General ADL Comments: Pt only able to stand pivot transfer to bedside chair. Pt very fatigued with minimal activity.      Vision Baseline Vision/History: Wears glasses Wears Glasses: At all times       Perception     Praxis      Pertinent Vitals/Pain Pain Assessment: No/denies pain     Hand Dominance Right   Extremity/Trunk Assessment Upper Extremity Assessment Upper Extremity Assessment: Generalized weakness   Lower Extremity Assessment Lower Extremity Assessment: Defer to PT evaluation       Communication Communication Communication: HOH   Cognition Arousal/Alertness: Lethargic Behavior During Therapy: WFL for tasks assessed/performed Overall Cognitive Status: No family/caregiver present to determine baseline cognitive functioning                                 General Comments: Pt only oriented to name.    General Comments  Pt on 8L HFNC with SpO2 93% upon arrival. SpO2 decreased to low 80s with minimal activity. Pt required 10 min seated rest break for SpO2 to increase to 91% with oxygen titrated up to 11L HFNC. RN updated.     Exercises     Shoulder Instructions      Home Living Family/patient expects to be discharged to:: Private residence Living Arrangements: Alone(lives in duplex with brother next door) Available Help at Discharge: Family Type of Home: Other(Comment)(Duplex) Home Access: Stairs to enter Technical brewer of Steps: 5 Entrance Stairs-Rails: Left Home Layout: One level     Bathroom Shower/Tub: Engineer, site: Standard Bathroom Accessibility: Yes   Home Equipment: Grab bars - toilet;Grab bars - tub/shower          Prior Functioning/Environment Level of Independence: Independent;Independent with assistive device(s)        Comments: Pt reports ambulating around independently with a cane. Pt independent with ADL, laundry, cooking, and cleaning. Pt reports that brother assists with grocery shopping. Pt still drives. Pt reports 0 falls in the last 6 months. Pt does not use oxygen at home.        OT Problem List: Decreased strength;Decreased activity tolerance;Impaired balance (sitting and/or standing);Decreased safety awareness;Decreased cognition;Decreased knowledge of use of DME or AE;Cardiopulmonary status limiting activity      OT Treatment/Interventions: Self-care/ADL training;Therapeutic exercise;Neuromuscular education;Energy conservation;DME and/or AE instruction;Therapeutic activities;Patient/family education;Balance training    OT Goals(Current goals can be found in the care plan section) Acute Rehab OT Goals Patient Stated Goal: Pt agreeable to sitting in chair Time For Goal Achievement: 07/20/19 Potential to Achieve Goals: Good ADL Goals Pt Will Perform Grooming: with supervision;standing Pt Will Perform Lower Body Bathing: with supervision;sit to/from stand Pt Will Perform Lower Body Dressing: with supervision;sit to/from stand Pt Will Transfer to Toilet: with supervision;ambulating Pt Will Perform Toileting - Clothing Manipulation and hygiene: with supervision;sit to/from stand Additional ADL Goal #1: Pt to recall and verbalize 3 fall prevention strategies with 0 verbal cues. Additional ADL Goal #2:  Pt to recall and verbalize 3 energy conservation strategies with 0 verbal cues. Additional ADL Goal #3: Pt to tolerate standing up to 5 min with supervision with oxygen maintaining above 90%, in preparation for ADLs.  OT Frequency: Min 3X/week   Barriers to D/C:             Co-evaluation              AM-PAC OT "6 Clicks" Daily Activity     Outcome Measure Help from another person eating meals?: A Little Help from another person taking care of personal grooming?: A Little Help from another person toileting, which includes using toliet, bedpan, or urinal?: A Lot Help from another person bathing (including washing, rinsing, drying)?: A Lot Help from another person to put on and taking off regular upper body clothing?: A Little Help from another person to put on and taking off regular lower body clothing?: A Lot 6 Click Score: 15   End of Session Equipment Utilized During Treatment: Oxygen Nurse Communication: Mobility status  Activity Tolerance: Patient limited by fatigue;Patient limited by lethargy(Limited by SOB) Patient left: in chair;with call bell/phone within reach;with chair alarm set  OT Visit Diagnosis: Unsteadiness on feet (R26.81);Muscle weakness (generalized) (M62.81)                Time: [id number] OT Time Calculation (min): 35 min Charges:  OT General Charges $OT Visit: 1 Visit OT Evaluation $OT Eval Moderate Complexity: 1 Mod OT Treatments $Therapeutic Activity: 8-22 mins  Mauri Brooklyn OTR/L [phone]   Mauri Brooklyn 07/06/2019, 3:34 PM

## 2019-07-06 NOTE — Progress Notes (Signed)
PROGRESS NOTE                                                                                                                                                                                                             Patient Demographics:    [name], is a 64 y.o. female, DOB - [birth year], [id number]  Outpatient Primary MD for the patient is Celene Squibb, MD   Admit date - 07/03/2019   LOS - 4  Chief Complaint  Patient presents with  . Shortness of Breath  . covid pos       Brief Narrative: Patient is a 64 y.o. 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  Post admission-she had significant worsening of her hypoxemia-requiring 100% NRB-she was then transferred to [facility].  See below for further details.   Subjective:   On 15 L of oxygen overnight-nursing staff not aware why she was placed on that much of oxygen.  While I was in the room-I titrated down to 10 L-spoke with nursing staff-she is now down to 8 L.  Patient does acknowledge some mild worsening of her shortness of breath-but appears fairly comfortable.   Assessment  & Plan :   Acute Hypoxic Resp Failure due to Covid 19 Viral pneumonia and concurrent bacterial pneumonia: Had improved and was down to 3 L of oxygen-however overnight hypoxemia has worsened.  Check chest x-ray-give 1 dose of IV Lasix.  Continue steroids and empiric cefepime.  Will complete remdesivir on 1/10.  Will follow closely.   Fever: afebrile  O2 requirements:  SpO2: 96 % O2 Flow  Rate (L/min): 8 L/min   COVID-19 Labs: Recent Labs    07/04/19 0455 07/05/19 0530 07/06/19 0330  DDIMER 4.37* 4.22* 4.18*  CRP 17.8* 9.7* 4.9*       Component Value Date/Time   BNP 1,321.0 (H) 07/25/2019 1455    Recent Labs  Lab 07/17/2019 1454  PROCALCITON 6.29    Lab Results  Component Value Date   SARSCOV2NAA Detected (A)  06/26/2019   [place] NEGATIVE 02/20/2019   [place] Not Detected 12/18/2018     COVID-19 Medications: Steroids: 1/6>> Remdesivir: 1/6>>1/10 Convalescent Plasma: 1/7   Other medications: Diuretics:Euvolemic-no signs of volume overload-but due to worsening hypoxemia-Lasix 80 mg IV x1. Antibiotics: Cefepime: 1/6>> Vancomycin: 1/6>>1/8  Prone/Incentive Spirometry: encouraged  incentive spirometry use 3-4/hour.  DVT Prophylaxis  : Xarelto  Persistent atrial fibrillation s/p AV node ablation and PPM insertion: Continue metoprolol-on Xarelto.  Rate under good control.  Paced rhythm on telemetry.  Note has history of amiodarone induced lung toxicity  Chronic combined systolic and diastolic heart failure: Worsening hypoxemia today-start Lasix-see above.  Bronchial asthma: Appears stable-no rhonchi-continue bronchodilators.  AKI on CKD stage IIIa: AKI likely hemodynamically mediated-volume status is stable.  Creatinine continues to improve.  HTN: BP controlled-continue metoprolol  DM-2 A1c 6.8): CBGs stable-continue Lantus 15 units daily, 4 units of NovoLog with meals and SSI.  CBG (last 3)  Recent Labs    07/05/19 1630 07/05/19 2041 07/06/19 0746  GLUCAP 212* 127* 152*   Hypothyroidism: Continue levothyroxine  History of amiodarone induced lung toxicity/ILD: Supportive care.  Consults  :  PCCM  Procedures  :  None  ABG:    Component Value Date/Time   PHART 7.427 05/15/2017 1158   PCO2ART 37.7 05/15/2017 1158   PO2ART 61.0 (L) 05/15/2017 1158   HCO3 24.9 05/15/2017 1158   TCO2 26 05/15/2017 1158   O2SAT 92.0 05/15/2017  1158    Vent Settings: N/A  Condition -stable  Family Communication  :  Brother-updated over the phone on 1/10 Code Status :  Full Code  Diet :  Diet Order            Diet heart healthy/carb modified Room service appropriate? Yes; Fluid consistency: Thin  Diet effective now               Disposition Plan  :  Remain hospitalized-Home with home health services when she is ready for discharge.  Barriers to discharge: Hypoxia requiring O2 supplementation Antimicorbials  :    Anti-infectives (From admission, onward)   Start     Dose/Rate Route Frequency Ordered Stop   07/04/19 1830  vancomycin (VANCOREADY) IVPB 750 mg/150 mL  Status:  Discontinued     750 mg 150 mL/hr over 60 Minutes Intravenous Every 48 hours 07/03/19 1249 07/04/19 1110   07/04/19 1800  vancomycin (VANCOCIN) IVPB 1000 mg/200 mL premix  Status:  Discontinued     1,000 mg 200 mL/hr over 60 Minutes Intravenous Every 48 hours 07/10/2019 1727 07/03/19 1249   07/03/19 1800  ceFEPIme (MAXIPIME) 1 g in sodium chloride 0.9 % 100 mL IVPB  Status:  Discontinued     1 g 200 mL/hr over 30 Minutes Intravenous Every 24 hours 07/11/2019 1725 07/03/19 1248   07/03/19 1800  ceFEPIme (MAXIPIME) 2 g in sodium chloride 0.9 % 100 mL IVPB     2 g 200 mL/hr over 30 Minutes Intravenous Every 24 hours 07/03/19 1247     07/03/19 1000  remdesivir 100 mg in sodium chloride 0.9 % 100 mL IVPB     100 mg 200 mL/hr over 30 Minutes Intravenous Daily 06/29/2019 1723 07/06/19 0926   07/03/19 1000  remdesivir 100 mg in sodium chloride 0.9 % 100 mL IVPB  Status:  Discontinued     100 mg 200 mL/hr over 30 Minutes Intravenous Daily 07/08/2019 2355 07/03/19 0257   07/22/2019 2354  remdesivir 200 mg in sodium chloride 0.9% 250 mL IVPB  Status:  Discontinued     200 mg 580 mL/hr over 30 Minutes Intravenous Once 07/15/2019 2355 07/03/19 0257   07/11/2019 1800  remdesivir 200 mg in sodium chloride 0.9% 250 mL IVPB     200 mg 580 mL/hr over 30 Minutes Intravenous  Once 07/06/2019 1723 07/07/2019 2306   07/12/2019 1730  vancomycin (VANCOCIN) IVPB 1000 mg/200 mL premix  Status:  Discontinued     1,000 mg 200 mL/hr over 60 Minutes Intravenous  Once 07/24/2019 1722 07/18/2019 1725   07/16/2019 1730  ceFEPIme (MAXIPIME) 2 g in sodium chloride 0.9 % 100 mL IVPB     2 g 200 mL/hr over 30 Minutes Intravenous  Once 07/01/2019 1722 07/11/2019 1918   07/14/2019 1730  vancomycin (VANCOREADY) IVPB 1500 mg/300 mL     1,500 mg 150 mL/hr over 120 Minutes Intravenous  Once 07/06/2019 1725 07/07/2019 2036      Inpatient Medications  Scheduled Meds: . sodium chloride   Intravenous Once  . vitamin C  500 mg Oral Daily  . dexamethasone (DECADRON) injection  10 mg Intravenous Q24H  . fluticasone furoate-vilanterol  2 puff Inhalation Daily  . insulin aspart  0-20 Units Subcutaneous TID WC  . insulin aspart  4 Units Subcutaneous TID WC  . insulin glargine  15 Units Subcutaneous QHS  . levothyroxine  100 mcg Oral QAC breakfast  . metoprolol succinate  50 mg Oral Daily  . pantoprazole  40 mg Oral BID  . pravastatin  10 mg Oral Daily  . Rivaroxaban  15 mg Oral Q breakfast  . zinc sulfate  220 mg Oral Daily   Continuous Infusions: . ceFEPime (MAXIPIME) IV Stopped (07/05/19 2042)   PRN Meds:.acetaminophen, albuterol, chlorpheniramine-HYDROcodone, guaiFENesin-dextromethorphan, senna-docusate   Time Spent in minutes  25  See all Orders from today for further details   Oren Binet M.D on 07/06/2019 at 10:18 AM  To page go to [email or web address] - use universal password  [facility] -  Office  [phone]    Objective:   Vitals:   07/06/19 0006 07/06/19 0335 07/06/19 0800 07/06/19 0846  BP:   (!) 134/98   Pulse: 74  71 70  Resp: (!) 22 20 (!) 24 (!) 26  Temp:  97.7 F (36.5 C) (!) 97.4 F (36.3 C)   TempSrc:  Oral Axillary   SpO2: 100%  (!) 89% 96%  Weight:      Height:        Wt Readings from Last 3 Encounters:  07/12/2019 59.9 kg  04/01/19 65.8 kg  04/01/19  64.4 kg     Intake/Output Summary (Last 24 hours) at 07/06/2019 1018 Last data filed at 07/06/2019 0000 Gross per 24 hour  Intake 200 ml  Output --  Net 200 ml     Physical Exam Gen Exam:Alert awake-not in any distress HEENT:atraumatic, normocephalic Chest: B/L clear to auscultation anteriorly CVS:S1S2 regular Abdomen:soft non tender, non distended Extremities:no edema Neurology: Non focal Skin: no rash   Data Review:    CBC Recent Labs  Lab 06/27/2019 1454 07/03/19 0804 07/04/19 0455 07/05/19 0530 07/06/19 0330  WBC 7.3 4.1 8.2 13.7* 15.1*  HGB 14.4 14.4 14.6 15.3* 15.5*  HCT 45.0 47.0* 45.5 47.6* 47.3*  PLT 267 236 288 335 291  MCV 97.4 99.8 96.0 95.2 93.7  MCH 31.2 30.6 30.8 30.6 30.7  MCHC 32.0 30.6 32.1 32.1 32.8  RDW 13.5 13.3 13.6 13.7 13.6  LYMPHSABS 0.6* 0.5* 0.6* 0.5* 0.6*  MONOABS 0.3 0.1 0.4 0.3 0.4  EOSABS 0.0 0.0 0.0 0.0 0.0  BASOSABS 0.0 0.0 0.0 0.0 0.1    Chemistries  Recent Labs  Lab 07/08/2019 1454 07/03/19 0804 07/04/19 0455 07/05/19 0530 07/06/19 0330  NA 136 140 142 140 141  K 3.6 4.3 4.1 4.1 4.3  CL 105 108 107 106 111  CO2 16* 18* 21* 21* 20*  GLUCOSE 146* 168* 230* 323* 108*  BUN 52* 59* 76* 77* 72*  CREATININE 2.43* 2.29* 2.33* 1.96* 1.52*  CALCIUM 8.5* 8.3* 8.6* 8.9 9.2  AST 69* 56* 42* 35 33  ALT 25 23 26 25 24   ALKPHOS 104 89 95 105 110  BILITOT 1.0 0.9 0.8 1.0 1.0   ------------------------------------------------------------------------------------------------------------------ No results for input(s): CHOL, HDL, LDLCALC, TRIG, CHOLHDL, LDLDIRECT in the last 72 hours.  Lab Results  Component Value Date   HGBA1C 6.8 (H) 07/06/2019   ------------------------------------------------------------------------------------------------------------------ No results for input(s): TSH, T4TOTAL, T3FREE, THYROIDAB in the last 72 hours.  Invalid input(s):  FREET3 ------------------------------------------------------------------------------------------------------------------ No results for input(s): VITAMINB12, FOLATE, FERRITIN, TIBC, IRON, RETICCTPCT in the last 72 hours.  Coagulation profile No results for input(s): INR, PROTIME in the last 168 hours.  Recent Labs    07/05/19 0530 07/06/19 0330  DDIMER 4.22* 4.18*    Cardiac Enzymes No results for input(s): CKMB, TROPONINI, MYOGLOBIN in the last 168 hours.  Invalid input(s): CK ------------------------------------------------------------------------------------------------------------------    Component Value Date/Time   BNP 1,321.0 (H) 06/29/2019 1455    Micro Results Recent Results (from the past 240 hour(s))  Blood Culture (routine x 2)     Status: None (Preliminary result)   Collection Time: 07/17/2019  2:54 PM   Specimen: Left Antecubital; Blood  Result Value Ref Range Status   Specimen Description LEFT ANTECUBITAL  Final   Special Requests   Final    BOTTLES DRAWN AEROBIC AND ANAEROBIC Blood Culture adequate volume   Culture   Final    NO GROWTH 3 DAYS Performed at [facility], [address] [place], [place] [address]    Report Status PENDING  Incomplete  Blood Culture (routine x 2)     Status: None (Preliminary result)   Collection Time: 07/01/2019  4:34 PM   Specimen: BLOOD LEFT ARM  Result Value Ref Range Status   Specimen Description BLOOD LEFT ARM  Final   Special Requests   Final    BOTTLES DRAWN AEROBIC AND ANAEROBIC Blood Culture adequate volume   Culture   Final    NO GROWTH 3 DAYS Performed at [facility], [address], [place], [place] [address]    Report Status PENDING  Incomplete    Radiology Reports DG Chest Port 1 View  Result Date: 07/03/2019 CLINICAL DATA:  COVID-19 positive.  Worsening shortness of breath. EXAM: PORTABLE CHEST 1 VIEW COMPARISON:  07/04/2019. FINDINGS: Surgical clips noted over the neck. AICD in stable position. Stable  cardiomegaly. Diffuse severe bilateral pulmonary infiltrates again noted. Slight improvement in aeration on today's exam. No pleural effusion pneumothorax. Old infarct proximal right humerus. No acute bony abnormality. IMPRESSION: 1.  AICD in stable position.  Stable cardiomegaly. 2. Diffuse severe bilateral pulmonary infiltrates again noted. Slight improvement in aeration on today's exam. Electronically Signed   By: Marcello Moores  Register   On: 07/03/2019 07:15   DG Chest Port 1 View  Result Date: 07/12/2019 CLINICAL DATA:  Hypoxia. EXAM: PORTABLE CHEST 1 VIEW COMPARISON:  None. FINDINGS: There is a dual lead AICD. Moderate severity diffuse bilateral infiltrates are seen. There is a small right pleural  effusion. No pneumothorax is identified. The cardiac silhouette is markedly enlarged. Multiple radiopaque surgical clips are seen overlying the superior mediastinum. There is marked severity levoscoliosis of the thoracic spine with moderate severity multilevel degenerative changes. IMPRESSION: 1. Moderate severity diffuse bilateral infiltrates with a small right pleural effusion. 2. Marked enlargement of the cardiac silhouette. 3. Dual lead AICD in place. Electronically Signed   By: Virgina Norfolk M.D.   On: 07/17/2019 15:50   MM 3D SCREEN BREAST BILATERAL  Result Date: 06/12/2019 CLINICAL DATA:  Screening. EXAM: DIGITAL SCREENING BILATERAL MAMMOGRAM WITH TOMO AND CAD COMPARISON:  Previous exam(s). ACR Breast Density Category a: The breast tissue is almost entirely fatty. FINDINGS: There are no findings suspicious for malignancy. Images were processed with CAD. IMPRESSION: No mammographic evidence of malignancy. A result letter of this screening mammogram will be mailed directly to the patient. RECOMMENDATION: Screening mammogram in one year. (Code:SM-B-01Y) BI-RADS CATEGORY  1: Negative. Electronically Signed   By: Nolon Nations M.D.   On: 06/12/2019 16:42

## 2019-07-06 NOTE — Progress Notes (Signed)
Pharmacy Antibiotic Note  [name] is a 64 y.o. female admitted on 07/23/2019 with pneumonia.  Pharmacy has been consulted for cefepime dosing.  Pt has been here for COVID and has completed remdesivir. She was also started on cefepime for PNA. Today would be D5. She has worsened a little today and Dr. Sloan Leiter would like to continue for 7d. Her renal function has improved but the current dose is appropriate.   Plan: Continue cefepime 2g IV q24 to complete 7d Rx will signs off  Height: 4\' 10"  (147.3 cm) Weight: 132 lb 0.9 oz (59.9 kg) IBW/kg (Calculated) : 40.9  Temp (24hrs), Avg:97.5 F (36.4 C), Min:96 F (35.6 C), Max:98.2 F (36.8 C)  Recent Labs  Lab 07/04/2019 1454 07/18/2019 1634 07/03/19 0804 07/04/19 0455 07/05/19 0530 07/06/19 0330  WBC 7.3  --  4.1 8.2 13.7* 15.1*  CREATININE 2.43*  --  2.29* 2.33* 1.96* 1.52*  LATICACIDVEN 2.8* 2.1*  --   --   --   --     Estimated Creatinine Clearance: 29 mL/min (A) (by C-G formula based on SCr of 1.52 mg/dL (H)).    Allergies  Allergen Reactions  . Flecainide Nausea Only and Other (See Comments)    Faint feeling  . Hydrocodone-Acetaminophen Nausea Only and Other (See Comments)    Severe headache  . Ibuprofen Other (See Comments)    Kidney dysfunction  . Oxycodone Hcl Nausea Only and Other (See Comments)    Headache  . Penicillins Nausea Only and Other (See Comments)    Severe headache Has patient had a PCN reaction causing immediate rash, facial/tongue/throat swelling, SOB or lightheadedness with hypotension: No Has patient had a PCN reaction causing severe rash involving mucus membranes or skin necrosis: No Has patient had a PCN reaction that required hospitalization: No Has patient had a PCN reaction occurring within the last 10 years: No If all of the above answers are "NO", then may proceed with Cephalosporin use.     Antimicrobials this admission: 1/6 vanc>>1/7 1/6 cefepime>>1/9  Dose adjustments this  admission:   Microbiology results: 1/6 blood>>neg  Onnie Boer, PharmD, [place], AAHIVP, CPP Infectious Disease Pharmacist 07/06/2019 1:21 PM

## 2019-07-06 NOTE — Progress Notes (Signed)
Pt brother updated and all questions answered.  

## 2019-07-07 LAB — COMPREHENSIVE METABOLIC PANEL
ALT: 23 U/L (ref 0–44)
AST: 28 U/L (ref 15–41)
Albumin: 2.7 g/dL — ABNORMAL LOW (ref 3.5–5.0)
Alkaline Phosphatase: 115 U/L (ref 38–126)
Anion gap: 17 — ABNORMAL HIGH (ref 5–15)
BUN: 80 mg/dL — ABNORMAL HIGH (ref 8–23)
CO2: 23 mmol/L (ref 22–32)
Calcium: 9.1 mg/dL (ref 8.9–10.3)
Chloride: 109 mmol/L (ref 98–111)
Creatinine, Ser: 1.75 mg/dL — ABNORMAL HIGH (ref 0.44–1.00)
GFR calc Af Amer: 35 mL/min — ABNORMAL LOW (ref >60)
GFR calc non Af Amer: 30 mL/min — ABNORMAL LOW (ref >60)
Glucose, Bld: 66 mg/dL — ABNORMAL LOW (ref 70–99)
Potassium: 3.9 mmol/L (ref 3.5–5.1)
Sodium: 149 mmol/L — ABNORMAL HIGH (ref 135–145)
Total Bilirubin: 1.2 mg/dL (ref 0.3–1.2)
Total Protein: 6.3 g/dL — ABNORMAL LOW (ref 6.5–8.1)

## 2019-07-07 LAB — GLUCOSE, CAPILLARY
Glucose-Capillary: 261 mg/dL — ABNORMAL HIGH (ref 70–99)
Glucose-Capillary: 109 mg/dL — ABNORMAL HIGH (ref 70–99)
Glucose-Capillary: 113 mg/dL — ABNORMAL HIGH (ref 70–99)
Glucose-Capillary: 113 mg/dL — ABNORMAL HIGH (ref 70–99)

## 2019-07-07 LAB — CBC WITH DIFFERENTIAL/PLATELET
Abs Immature Granulocytes: 0.42 10*3/uL — ABNORMAL HIGH (ref 0.00–0.07)
Basophils Absolute: 0.1 10*3/uL (ref 0.0–0.1)
Basophils Relative: 1 %
Eosinophils Absolute: 0 10*3/uL (ref 0.0–0.5)
Eosinophils Relative: 0 %
HCT: 49 % — ABNORMAL HIGH (ref 36.0–46.0)
Hemoglobin: 16.1 g/dL — ABNORMAL HIGH (ref 12.0–15.0)
Immature Granulocytes: 3 %
Lymphocytes Relative: 4 %
Lymphs Abs: 0.7 10*3/uL (ref 0.7–4.0)
MCH: 31.3 pg (ref 26.0–34.0)
MCHC: 32.9 g/dL (ref 30.0–36.0)
MCV: 95.1 fL (ref 80.0–100.0)
Monocytes Absolute: 0.6 10*3/uL (ref 0.1–1.0)
Monocytes Relative: 4 %
Neutro Abs: 15 10*3/uL — ABNORMAL HIGH (ref 1.7–7.7)
Neutrophils Relative %: 88 %
Platelets: 255 10*3/uL (ref 150–400)
RBC: 5.15 MIL/uL — ABNORMAL HIGH (ref 3.87–5.11)
RDW: 13.9 % (ref 11.5–15.5)
WBC: 16.8 10*3/uL — ABNORMAL HIGH (ref 4.0–10.5)
nRBC: 0.2 % (ref 0.0–0.2)

## 2019-07-07 LAB — CULTURE, BLOOD (ROUTINE X 2)
Culture: NO GROWTH
Culture: NO GROWTH
Special Requests: ADEQUATE
Special Requests: ADEQUATE

## 2019-07-07 LAB — PROCALCITONIN: Procalcitonin: 0.61 ng/mL

## 2019-07-07 LAB — D-DIMER, QUANTITATIVE: D-Dimer, Quant: 3.89 ug/mL-FEU — ABNORMAL HIGH (ref 0.00–0.50)

## 2019-07-07 LAB — C-REACTIVE PROTEIN: CRP: 2.9 mg/dL — ABNORMAL HIGH (ref <1.0)

## 2019-07-07 MED ORDER — LIP MEDEX EX OINT
1.0000 "application " | TOPICAL_OINTMENT | CUTANEOUS | Status: DC | PRN
  Administered 2019-07-07 – 2019-07-08 (×2): 1 via TOPICAL
  Filled 2019-07-07 (×2): qty 7

## 2019-07-07 NOTE — Progress Notes (Signed)
Physical Therapy Treatment Patient Details Name: [name] MRN: [id number] DOB: 07/22/1955 Today's Date: 07/07/2019    History of Present Illness 64 y.o. female, PMHx including amiodarone toxicity, interstitial lung disease, chronic systolic/diastolic heart failure, GERD,  history of congenital heart disease, DM II, iron deficient anemia, hypertension,hyperparathyroidism, peripheral vascular disease, renal insufficiency, persistent A. fib anticoagulated on Xarelto presented to ED w/ shortness of breath, patient was recently diagnosed 12/31 with COVID-19, at baseline has no oxygen requirement, patient reported her symptoms started on 1/3, with progressive dyspnea, fever, chills, cough, neurolyse body ache, denies nausea, vomiting, diarrhea, chest pain, leg edema, or orthopnea. in ED was febrile 103, hypoxic in the mid 80s, requiring initially 6 L nasal cannula, chest x-ray significant for diffuse bilateral opacities, pt admited for COVID-19 for pneumonia.    PT Comments    Pt's mobility limited primarily by respiratory status.    Follow Up Recommendations  SNF     Equipment Recommendations  Rolling walker with 5" wheels    Recommendations for Other Services       Precautions / Restrictions Precautions Precautions: Fall;Other (comment) Precaution Comments: Monitor oxygen Restrictions Weight Bearing Restrictions: No    Mobility  Bed Mobility Overal bed mobility: Needs Assistance Bed Mobility: Supine to Sit     Supine to sit: HOB elevated;Min guard Sit to supine: Min assist   General bed mobility comments: Verbal/tactile cues to initiate task.  Transfers Overall transfer level: Needs assistance Equipment used: Rolling walker (2 wheeled) Transfers: Sit to/from [facility] Sit to Stand: Min assist Stand pivot transfers: Min assist       General transfer comment: Assist to bring hips up and for balance. Chair to bsc to  chair  Ambulation/Gait Ambulation/Gait assistance: Min assist Gait Distance (Feet): 3 Feet Assistive device: Rolling walker (2 wheeled) Gait Pattern/deviations: Step-to pattern;Decreased step length - right;Decreased step length - left;Shuffle Gait velocity: decr Gait velocity interpretation: <1.31 ft/sec, indicative of household ambulator General Gait Details: Assist for balance and support. Amb bed to chair.    Stairs             Wheelchair Mobility    Modified Rankin (Stroke Patients Only)       Balance Overall balance assessment: Needs assistance Sitting-balance support: No upper extremity supported;Feet unsupported Sitting balance-Leahy Scale: Fair     Standing balance support: Bilateral upper extremity supported Standing balance-Leahy Scale: Poor Standing balance comment: UE support and min guard for static standing                            Cognition Arousal/Alertness: Awake/alert Behavior During Therapy: WFL for tasks assessed/performed Overall Cognitive Status: No family/caregiver present to determine baseline cognitive functioning                                 General Comments: Pt following 1 step commands with incr time and inconsistently.  Frequent cues for pursed lip breathing. Question if there is a baseline developmental delay      Exercises      General Comments General comments (skin integrity, edema, etc.): Pt on 4L with SpO2 low 90's/high 80's at rest. Dropped to mid 70's with activity.      Pertinent Vitals/Pain Pain Assessment: No/denies pain    Home Living  Prior Function            PT Goals (current goals can now be found in the care plan section) Progress towards PT goals: Not progressing toward goals - comment    Frequency    Min 2X/week      PT Plan Current plan remains appropriate;Frequency needs to be updated    Co-evaluation              AM-PAC PT  "6 Clicks" Mobility   Outcome Measure  Help needed turning from your back to your side while in a flat bed without using bedrails?: A Little Help needed moving from lying on your back to sitting on the side of a flat bed without using bedrails?: A Little Help needed moving to and from a bed to a chair (including a wheelchair)?: A Little Help needed standing up from a chair using your arms (e.g., wheelchair or bedside chair)?: A Little Help needed to walk in hospital room?: A Little Help needed climbing 3-5 steps with a railing? : Total 6 Click Score: 16    End of Session Equipment Utilized During Treatment: Gait belt;Oxygen Activity Tolerance: Patient limited by fatigue Patient left: with call bell/phone within reach;in chair;with chair alarm set Nurse Communication: Mobility status PT Visit Diagnosis: Other abnormalities of gait and mobility (R26.89);Muscle weakness (generalized) (M62.81)     Time: [id number] PT Time Calculation (min) (ACUTE ONLY): 20 min  Charges:  $Therapeutic Activity: 8-22 mins                     [place] Pager [phone] Office [place] 07/07/2019, 2:08 PM

## 2019-07-07 NOTE — Plan of Care (Signed)

## 2019-07-07 NOTE — Progress Notes (Signed)
Inpatient Diabetes Program Recommendations  AACE/ADA: New Consensus Statement on Inpatient Glycemic Control (2015)  Target Ranges:  Prepandial:   less than 140 mg/dL      Peak postprandial:   less than 180 mg/dL (1-2 hours)      Critically ill patients:  140 - 180 mg/dL   Lab Results  Component Value Date   GLUCAP 113 (H) 07/07/2019   HGBA1C 6.8 (H) 07/06/2019    Review of Glycemic Control Results for [name], [name] (MRN [id number]) as of 07/07/2019 15:18  Ref. Range 07/06/2019 07:46 07/06/2019 12:16 07/06/2019 15:47 07/06/2019 17:53 07/06/2019 21:25 07/07/2019 07:13 07/07/2019 11:10  Glucose-Capillary Latest Ref Range: 70 - 99 mg/dL 152 (H) 162 (H) 157 (H) 121 (H) 97 109 (H) 113 (H)   Diabetes history: Dm 2 Outpatient Diabetes medications:  Januvia 50 mg daily Current orders for Inpatient glycemic control:  Decadron 10 mg IV q HS Novolog resistant tid with meals Novolog 4 units tid with meals Lantus 15 units q HS.   Inpatient Diabetes Program Recommendations:    Patient did not receive Lantus 15 units last PM.  Fasting Lab glucose <70 mg/dL.  Please consider d/c of Lantus.   Thanks  Adah Perl, RN, BC-ADM Inpatient Diabetes Coordinator Pager [phone] (8a-5p)

## 2019-07-07 NOTE — Progress Notes (Signed)
Physical Therapy Treatment Patient Details Name: [name] MRN: [id number] DOB: Aug 11, 1955 Today's Date: 07/07/2019    History of Present Illness 64 y.o. female, PMHx including amiodarone toxicity, interstitial lung disease, chronic systolic/diastolic heart failure, GERD,  history of congenital heart disease, DM II, iron deficient anemia, hypertension,hyperparathyroidism, peripheral vascular disease, renal insufficiency, persistent A. fib anticoagulated on Xarelto presented to ED w/ shortness of breath, patient was recently diagnosed 12/31 with COVID-19, at baseline has no oxygen requirement, patient reported her symptoms started on 1/3, with progressive dyspnea, fever, chills, cough, neurolyse body ache, denies nausea, vomiting, diarrhea, chest pain, leg edema, or orthopnea. in ED was febrile 103, hypoxic in the mid 80s, requiring initially 6 L nasal cannula, chest x-ray significant for diffuse bilateral opacities, pt admited for COVID-19 for pneumonia.    PT Comments    Pt continues to require assist with all mobility. Tolerates little activity. Updated dc recommendations to SNF.    Follow Up Recommendations  SNF     Equipment Recommendations  Rolling walker with 5" wheels    Recommendations for Other Services       Precautions / Restrictions Precautions Precautions: Fall;Other (comment) Precaution Comments: Monitor oxygen Restrictions Weight Bearing Restrictions: No    Mobility  Bed Mobility Overal bed mobility: Needs Assistance Bed Mobility: Supine to Sit;Sit to Supine     Supine to sit: HOB elevated;Min guard Sit to supine: Min assist   General bed mobility comments: Verbal/tactile cues to initiate task. Assist to bring legs back up into bed.  Transfers Overall transfer level: Needs assistance Equipment used: Rolling walker (2 wheeled) Transfers: Sit to/from Stand Sit to Stand: Min assist         General transfer comment: Assist to bring hips up and for  balance  Ambulation/Gait Ambulation/Gait assistance: Min assist Gait Distance (Feet): 2 Feet Assistive device: Rolling walker (2 wheeled) Gait Pattern/deviations: Step-to pattern;Decreased step length - right;Decreased step length - left;Shuffle Gait velocity: decr Gait velocity interpretation: <1.31 ft/sec, indicative of household ambulator General Gait Details: Assist for balance and support to side step up the side of bed   Stairs             Wheelchair Mobility    Modified Rankin (Stroke Patients Only)       Balance Overall balance assessment: Needs assistance Sitting-balance support: No upper extremity supported;Feet unsupported Sitting balance-Leahy Scale: Fair     Standing balance support: Bilateral upper extremity supported Standing balance-Leahy Scale: Poor Standing balance comment: UE support and min guard for static standing                            Cognition Arousal/Alertness: Lethargic Behavior During Therapy: WFL for tasks assessed/performed Overall Cognitive Status: No family/caregiver present to determine baseline cognitive functioning                                 General Comments: Pt following 1 step commands with incr time and inconsistently.  Frequent cues for pursed lip breathing. Question if there is a baseline developmental delay      Exercises      General Comments General comments (skin integrity, edema, etc.): Pt on 4L with SpO2 dropping to the 70's. Incr O2 to 6L with SpO2 incr back to high 80's.      Pertinent Vitals/Pain Pain Assessment: No/denies pain    Home Living  Prior Function            PT Goals (current goals can now be found in the care plan section) Progress towards PT goals: Not progressing toward goals - comment    Frequency    Min 3X/week      PT Plan Discharge plan needs to be updated    Co-evaluation              AM-PAC PT "6  Clicks" Mobility   Outcome Measure  Help needed turning from your back to your side while in a flat bed without using bedrails?: A Little Help needed moving from lying on your back to sitting on the side of a flat bed without using bedrails?: A Little Help needed moving to and from a bed to a chair (including a wheelchair)?: A Little Help needed standing up from a chair using your arms (e.g., wheelchair or bedside chair)?: A Little Help needed to walk in hospital room?: A Lot Help needed climbing 3-5 steps with a railing? : Total 6 Click Score: 15    End of Session Equipment Utilized During Treatment: Gait belt;Oxygen Activity Tolerance: Patient limited by lethargy;Patient limited by fatigue Patient left: in bed;with call bell/phone within reach;with bed alarm set Nurse Communication: Mobility status;Other (comment)(need for bed change) PT Visit Diagnosis: Other abnormalities of gait and mobility (R26.89);Muscle weakness (generalized) (M62.81)     Time: [id number] PT Time Calculation (min) (ACUTE ONLY): 15 min  Charges:  $Therapeutic Activity: 8-22 mins                     [place] Pager [phone] Office [place] 07/07/2019, 12:06 PM

## 2019-07-07 NOTE — Progress Notes (Addendum)
PROGRESS NOTE                                                                                                                                                                                                             Patient Demographics:    [name], is a 64 y.o. female, DOB - [birth year], [id number]  Outpatient Primary MD for the patient is Celene Squibb, MD   Admit date - 07/11/2019   LOS - 5  Chief Complaint  Patient presents with  . Shortness of Breath  . covid pos       Brief Narrative: Patient is a 64 y.o. female with PMHx of persistent atrial fibrillation on anticoagulation, amiodarone induced lung toxicity with associated ILD, chronic combined systolic and diastolic heart failure, DM-2, HTN, DM-2-who presented to [facility] emergency room on 1/6 with shortness of breath-she was found to have acute hypoxic respiratory failure secondary to Covid 19 pneumonia and concurrent bacterial pneumonia-she was then admitted to the hospitalist service.  Post admission-she had significant worsening of her hypoxemia-requiring 100% NRB-she was then transferred to [facility].  See below for further details.   Subjective:   Lying comfortably in bed-down to 3-5 L of oxygen today.   Assessment  & Plan :   Acute Hypoxic Resp Failure due to Covid 19 Viral pneumonia and concurrent bacterial pneumonia: Improved after admission only to deteriorate yesterday-however this morning she seems to be better-her O2 saturation is around 3-5 L.  She however appears more weak and frail.  Continue steroids and cefepime.  Completed remdesivir on 1/10.   Fever: afebrile  O2 requirements:  SpO2: 94 % O2 Flow Rate (L/min): 5 L/min   COVID-19 Labs: Recent Labs    07/05/19 0530 07/06/19 0330 07/07/19 0352  DDIMER 4.22* 4.18* 3.89*  CRP 9.7* 4.9* 2.9*       Component Value Date/Time   BNP 1,321.0 (H) 07/01/2019 1455     Recent Labs  Lab 07/11/2019 1454  PROCALCITON 6.29    Lab Results  Component Value Date   SARSCOV2NAA Detected (A) 06/26/2019   [place] NEGATIVE 02/20/2019   [place] Not Detected 12/18/2018     COVID-19 Medications: Steroids: 1/6>> Remdesivir: 1/6>>1/10 Convalescent Plasma: 1/7   Other medications: Diuretics:Euvolemic-hold Lasix Antibiotics: Cefepime: 1/6>> Vancomycin: 1/6>>1/8  Prone/Incentive Spirometry: encouraged  incentive spirometry use 3-4/hour.  DVT Prophylaxis  : Xarelto  Persistent atrial fibrillation s/p AV node ablation and PPM insertion: Continue metoprolol-on Xarelto.  Rate under good control.  Paced rhythm on telemetry.  Note has history of amiodarone induced lung toxicity  Chronic combined systolic and diastolic heart failure: Euvolemic-hold Lasix.  Bronchial asthma: Appears stable-no rhonchi-continue bronchodilators.  AKI on CKD stage IIIa: AKI likely hemodynamically mediated-volume status is stable.  Creatinine continues to improve.  Hypernatremia: Supportive care for now-we will avoid hypotonic saline-given history of CHF-encourage oral intake.  Repeat electrolytes tomorrow.  No further diuretics for now.  HTN: BP controlled-continue metoprolol  DM-2 A1c 6.8): CBGs stable-but with what appears to be mild hypoglycemia this morning-hold Lantus-continue SSI.  Follow.  CBG (last 3)  Recent Labs    07/06/19 2125 07/07/19 0713 07/07/19 1110  GLUCAP 97 109* 113*   Hypothyroidism: Continue levothyroxine  History of amiodarone induced lung toxicity/ILD: Supportive care.  Remote history of ASD repair  Deconditioning/debility: Appears very weak from acute illness  Consults  :  PCCM  Procedures  :  None  ABG:    Component Value Date/Time   PHART 7.427 05/15/2017 1158   PCO2ART 37.7 05/15/2017 1158   PO2ART 61.0 (L) 05/15/2017 1158   HCO3 24.9 05/15/2017 1158   TCO2 26 05/15/2017 1158   O2SAT 92.0 05/15/2017 1158    Vent  Settings: N/A  Condition -stable  Family Communication  :  Brother-updated over the phone on 1/11  Code Status :  Full Code  Diet :  Diet Order            Diet heart healthy/carb modified Room service appropriate? Yes; Fluid consistency: Thin  Diet effective now               Disposition Plan  :  Remain hospitalized-Home with home health services when she is ready for discharge.  Barriers to discharge: Hypoxia requiring O2 supplementation Antimicorbials  :    Anti-infectives (From admission, onward)   Start     Dose/Rate Route Frequency Ordered Stop   07/04/19 1830  vancomycin (VANCOREADY) IVPB 750 mg/150 mL  Status:  Discontinued     750 mg 150 mL/hr over 60 Minutes Intravenous Every 48 hours 07/03/19 1249 07/04/19 1110   07/04/19 1800  vancomycin (VANCOCIN) IVPB 1000 mg/200 mL premix  Status:  Discontinued     1,000 mg 200 mL/hr over 60 Minutes Intravenous Every 48 hours 07/04/2019 1727 07/03/19 1249   07/03/19 1800  ceFEPIme (MAXIPIME) 1 g in sodium chloride 0.9 % 100 mL IVPB  Status:  Discontinued     1 g 200 mL/hr over 30 Minutes Intravenous Every 24 hours 07/27/2019 1725 07/03/19 1248   07/03/19 1800  ceFEPIme (MAXIPIME) 2 g in sodium chloride 0.9 % 100 mL IVPB     2 g 200 mL/hr over 30 Minutes Intravenous Every 24 hours 07/03/19 1247 07/10/19 1759   07/03/19 1000  remdesivir 100 mg in sodium chloride 0.9 % 100 mL IVPB     100 mg 200 mL/hr over 30 Minutes Intravenous Daily 07/01/2019 1723 07/06/19 0926   07/03/19 1000  remdesivir 100 mg in sodium chloride 0.9 % 100 mL IVPB  Status:  Discontinued     100 mg 200 mL/hr over 30 Minutes Intravenous Daily 07/12/2019 2355 07/03/19 0257   07/01/2019 2354  remdesivir 200 mg in sodium chloride 0.9% 250 mL IVPB  Status:  Discontinued     200 mg 580 mL/hr over 30 Minutes Intravenous Once 06/29/2019 2355 07/03/19  0257   07/25/2019 1800  remdesivir 200 mg in sodium chloride 0.9% 250 mL IVPB     200 mg 580 mL/hr over 30 Minutes Intravenous  Once 07/22/2019 1723 07/01/2019 2306   07/27/2019 1730  vancomycin (VANCOCIN) IVPB 1000 mg/200 mL premix  Status:  Discontinued     1,000 mg 200 mL/hr over 60 Minutes Intravenous  Once 07/26/2019 1722 07/13/2019 1725   07/09/2019 1730  ceFEPIme (MAXIPIME) 2 g in sodium chloride 0.9 % 100 mL IVPB     2 g 200 mL/hr over 30 Minutes Intravenous  Once 07/08/2019 1722 07/10/2019 1918   07/11/2019 1730  vancomycin (VANCOREADY) IVPB 1500 mg/300 mL     1,500 mg 150 mL/hr over 120 Minutes Intravenous  Once 07/21/2019 1725 07/14/2019 2036      Inpatient Medications  Scheduled Meds: . sodium chloride   Intravenous Once  . vitamin C  500 mg Oral Daily  . dexamethasone (DECADRON) injection  10 mg Intravenous Q24H  . fluticasone furoate-vilanterol  2 puff Inhalation Daily  . insulin aspart  0-20 Units Subcutaneous TID WC  . insulin aspart  4 Units Subcutaneous TID WC  . insulin glargine  15 Units Subcutaneous QHS  . levothyroxine  100 mcg Oral QAC breakfast  . metoprolol succinate  50 mg Oral Daily  . pantoprazole  40 mg Oral BID  . pravastatin  10 mg Oral Daily  . Rivaroxaban  15 mg Oral Q breakfast  . zinc sulfate  220 mg Oral Daily   Continuous Infusions: . ceFEPime (MAXIPIME) IV Stopped (07/06/19 1858)   PRN Meds:.acetaminophen, albuterol, chlorpheniramine-HYDROcodone, guaiFENesin-dextromethorphan, lip balm, senna-docusate   Time Spent in minutes  25  See all Orders from today for further details   Oren Binet M.D on 07/07/2019 at 2:20 PM  To page go to [email or web address] - use universal password  [facility] -  Office  [phone]    Objective:   Vitals:   07/07/19 1114 07/07/19 1200 07/07/19 1400 07/07/19 1408  BP: (!) 144/101     Pulse: 75 70 71 73  Resp: (!) 30 (!) 27 16 20   Temp: 97.8 F (36.6 C)     TempSrc: Axillary     SpO2: 90% 100% (!) 84% 94%  Weight:      Height:        Wt Readings from Last 3 Encounters:  07/09/2019 59.9 kg  04/01/19 65.8 kg  04/01/19 64.4 kg      Intake/Output Summary (Last 24 hours) at 07/07/2019 1420 Last data filed at 07/07/2019 0953 Gross per 24 hour  Intake 120 ml  Output --  Net 120 ml     Physical Exam Gen Exam:Alert awake-looks very frail and deconditioned. HEENT:atraumatic, normocephalic Chest: B/L clear to auscultation anteriorly CVS:S1S2 regular Abdomen:soft non tender, non distended Extremities:no edema Neurology: Non focal Skin: no rash   Data Review:    CBC Recent Labs  Lab 07/03/19 0804 07/04/19 0455 07/05/19 0530 07/06/19 0330 07/07/19 0352  WBC 4.1 8.2 13.7* 15.1* 16.8*  HGB 14.4 14.6 15.3* 15.5* 16.1*  HCT 47.0* 45.5 47.6* 47.3* 49.0*  PLT 236 288 335 291 255  MCV 99.8 96.0 95.2 93.7 95.1  MCH 30.6 30.8 30.6 30.7 31.3  MCHC 30.6 32.1 32.1 32.8 32.9  RDW 13.3 13.6 13.7 13.6 13.9  LYMPHSABS 0.5* 0.6* 0.5* 0.6* 0.7  MONOABS 0.1 0.4 0.3 0.4 0.6  EOSABS 0.0 0.0 0.0 0.0 0.0  BASOSABS 0.0 0.0 0.0 0.1 0.1    Chemistries  Recent Labs  Lab 07/03/19 0804 07/04/19 0455 07/05/19 0530 07/06/19 0330 07/07/19 0352  NA 140 142 140 141 149*  K 4.3 4.1 4.1 4.3 3.9  CL 108 107 106 111 109  CO2 18* 21* 21* 20* 23  GLUCOSE 168* 230* 323* 108* 66*  BUN 59* 76* 77* 72* 80*  CREATININE 2.29* 2.33* 1.96* 1.52* 1.75*  CALCIUM 8.3* 8.6* 8.9 9.2 9.1  AST 56* 42* 35 33 28  ALT 23 26 25 24 23   ALKPHOS 89 95 105 110 115  BILITOT 0.9 0.8 1.0 1.0 1.2   ------------------------------------------------------------------------------------------------------------------ No results for input(s): CHOL, HDL, LDLCALC, TRIG, CHOLHDL, LDLDIRECT in the last 72 hours.  Lab Results  Component Value Date   HGBA1C 6.8 (H) 07/06/2019   ------------------------------------------------------------------------------------------------------------------ No results for input(s): TSH, T4TOTAL, T3FREE, THYROIDAB in the last 72 hours.  Invalid input(s):  FREET3 ------------------------------------------------------------------------------------------------------------------ No results for input(s): VITAMINB12, FOLATE, FERRITIN, TIBC, IRON, RETICCTPCT in the last 72 hours.  Coagulation profile No results for input(s): INR, PROTIME in the last 168 hours.  Recent Labs    07/06/19 0330 07/07/19 0352  DDIMER 4.18* 3.89*    Cardiac Enzymes No results for input(s): CKMB, TROPONINI, MYOGLOBIN in the last 168 hours.  Invalid input(s): CK ------------------------------------------------------------------------------------------------------------------    Component Value Date/Time   BNP 1,321.0 (H) 07/10/2019 1455    Micro Results Recent Results (from the past 240 hour(s))  Blood Culture (routine x 2)     Status: None   Collection Time: 07/20/2019  2:54 PM   Specimen: Left Antecubital; Blood  Result Value Ref Range Status   Specimen Description LEFT ANTECUBITAL  Final   Special Requests   Final    BOTTLES DRAWN AEROBIC AND ANAEROBIC Blood Culture adequate volume   Culture   Final    NO GROWTH 5 DAYS Performed at [facility], [address], [place], [place] [address]    Report Status 07/07/2019 FINAL  Final  Blood Culture (routine x 2)     Status: None   Collection Time: 07/26/2019  4:34 PM   Specimen: BLOOD LEFT ARM  Result Value Ref Range Status   Specimen Description BLOOD LEFT ARM  Final   Special Requests   Final    BOTTLES DRAWN AEROBIC AND ANAEROBIC Blood Culture adequate volume   Culture   Final    NO GROWTH 5 DAYS Performed at [facility], [address] [place], [place] [address]    Report Status 07/07/2019 FINAL  Final    Radiology Reports DG Chest Port 1 View  Result Date: 07/03/2019 CLINICAL DATA:  COVID-19 positive.  Worsening shortness of breath. EXAM: PORTABLE CHEST 1 VIEW COMPARISON:  07/14/2019. FINDINGS: Surgical clips noted over the neck. AICD in stable position. Stable cardiomegaly. Diffuse severe  bilateral pulmonary infiltrates again noted. Slight improvement in aeration on today's exam. No pleural effusion pneumothorax. Old infarct proximal right humerus. No acute bony abnormality. IMPRESSION: 1.  AICD in stable position.  Stable cardiomegaly. 2. Diffuse severe bilateral pulmonary infiltrates again noted. Slight improvement in aeration on today's exam. Electronically Signed   By: Marcello Moores  Register   On: 07/03/2019 07:15   DG Chest Port 1 View  Result Date: 07/26/2019 CLINICAL DATA:  Hypoxia. EXAM: PORTABLE CHEST 1 VIEW COMPARISON:  None. FINDINGS: There is a dual lead AICD. Moderate severity diffuse bilateral infiltrates are seen. There is a small right pleural effusion. No pneumothorax is identified. The cardiac silhouette is markedly enlarged. Multiple radiopaque surgical clips are seen overlying the superior mediastinum. There is  marked severity levoscoliosis of the thoracic spine with moderate severity multilevel degenerative changes. IMPRESSION: 1. Moderate severity diffuse bilateral infiltrates with a small right pleural effusion. 2. Marked enlargement of the cardiac silhouette. 3. Dual lead AICD in place. Electronically Signed   By: Virgina Norfolk M.D.   On: 07/21/2019 15:50   DG Chest Port 1V today  Result Date: 07/06/2019 CLINICAL DATA:  Dyspnea, COVID-19 positive EXAM: PORTABLE CHEST 1 VIEW COMPARISON:  07/03/2019 chest radiograph. FINDINGS: Stable configuration of 2 lead left subclavian pacemaker. Surgical clips overlie the central lower neck. Stable cardiomediastinal silhouette with mild cardiomegaly. No pneumothorax. No pleural effusion. Patchy hazy opacities throughout the mid to lower lungs bilaterally, similar to minimally improved. IMPRESSION: Patchy hazy opacities throughout the mid to lower lungs bilaterally, similar to minimally improved, compatible with COVID-19 pneumonia. Stable cardiomegaly. Electronically Signed   By: Ilona Sorrel M.D.   On: 07/06/2019 13:09   MM 3D SCREEN  BREAST BILATERAL  Result Date: 06/12/2019 CLINICAL DATA:  Screening. EXAM: DIGITAL SCREENING BILATERAL MAMMOGRAM WITH TOMO AND CAD COMPARISON:  Previous exam(s). ACR Breast Density Category a: The breast tissue is almost entirely fatty. FINDINGS: There are no findings suspicious for malignancy. Images were processed with CAD. IMPRESSION: No mammographic evidence of malignancy. A result letter of this screening mammogram will be mailed directly to the patient. RECOMMENDATION: Screening mammogram in one year. (Code:SM-B-01Y) BI-RADS CATEGORY  1: Negative. Electronically Signed   By: Nolon Nations M.D.   On: 06/12/2019 16:42

## 2019-07-07 NOTE — Progress Notes (Signed)
Patient is very agreeable and tells you she will eat her meals and that she is happy with what they are sending her however she does not eat. RN repeated Instructed patient to eat bites of lunch and she says okay but does not. Patient fed by RN. After a few bites patient stops swallowing and masticates her bolus for 79min. I repeatedly ask if she is having trouble swallowing, she says not. I ask her to swallow, she doesn't. I ask if it hurts she says no. I ask why she won't swallow she says "I guess i'm just not interested". Eventually She swallows with effort when I tell her to "swallow now please".

## 2019-07-08 LAB — COMPREHENSIVE METABOLIC PANEL
ALT: 23 U/L (ref 0–44)
AST: 24 U/L (ref 15–41)
Albumin: 2.6 g/dL — ABNORMAL LOW (ref 3.5–5.0)
Alkaline Phosphatase: 117 U/L (ref 38–126)
Anion gap: 14 (ref 5–15)
BUN: 85 mg/dL — ABNORMAL HIGH (ref 8–23)
CO2: 22 mmol/L (ref 22–32)
Calcium: 9.2 mg/dL (ref 8.9–10.3)
Chloride: 114 mmol/L — ABNORMAL HIGH (ref 98–111)
Creatinine, Ser: 1.76 mg/dL — ABNORMAL HIGH (ref 0.44–1.00)
GFR calc Af Amer: 35 mL/min — ABNORMAL LOW (ref >60)
GFR calc non Af Amer: 30 mL/min — ABNORMAL LOW (ref >60)
Glucose, Bld: 80 mg/dL (ref 70–99)
Potassium: 4.3 mmol/L (ref 3.5–5.1)
Sodium: 150 mmol/L — ABNORMAL HIGH (ref 135–145)
Total Bilirubin: 1.3 mg/dL — ABNORMAL HIGH (ref 0.3–1.2)
Total Protein: 6 g/dL — ABNORMAL LOW (ref 6.5–8.1)

## 2019-07-08 LAB — CBC
HCT: 47.9 % — ABNORMAL HIGH (ref 36.0–46.0)
Hemoglobin: 15.6 g/dL — ABNORMAL HIGH (ref 12.0–15.0)
MCH: 30.9 pg (ref 26.0–34.0)
MCHC: 32.6 g/dL (ref 30.0–36.0)
MCV: 94.9 fL (ref 80.0–100.0)
Platelets: 254 10*3/uL (ref 150–400)
RBC: 5.05 MIL/uL (ref 3.87–5.11)
RDW: 14.2 % (ref 11.5–15.5)
WBC: 17.5 10*3/uL — ABNORMAL HIGH (ref 4.0–10.5)
nRBC: 0.4 % — ABNORMAL HIGH (ref 0.0–0.2)

## 2019-07-08 LAB — GLUCOSE, CAPILLARY
Glucose-Capillary: 111 mg/dL — ABNORMAL HIGH (ref 70–99)
Glucose-Capillary: 109 mg/dL — ABNORMAL HIGH (ref 70–99)
Glucose-Capillary: 125 mg/dL — ABNORMAL HIGH (ref 70–99)
Glucose-Capillary: 262 mg/dL — ABNORMAL HIGH (ref 70–99)
Glucose-Capillary: 266 mg/dL — ABNORMAL HIGH (ref 70–99)
Glucose-Capillary: 240 mg/dL — ABNORMAL HIGH (ref 70–99)

## 2019-07-08 LAB — D-DIMER, QUANTITATIVE: D-Dimer, Quant: 3.29 ug/mL-FEU — ABNORMAL HIGH (ref 0.00–0.50)

## 2019-07-08 LAB — PROCALCITONIN: Procalcitonin: 0.28 ng/mL

## 2019-07-08 LAB — C-REACTIVE PROTEIN: CRP: 1.7 mg/dL — ABNORMAL HIGH (ref <1.0)

## 2019-07-08 LAB — FERRITIN: Ferritin: 836 ng/mL — ABNORMAL HIGH (ref 11–307)

## 2019-07-08 MED ORDER — DEXTROSE 5 % IV SOLN: INTRAVENOUS | Status: AC

## 2019-07-08 MED ORDER — DEXAMETHASONE SODIUM PHOSPHATE 10 MG/ML IJ SOLN
6.0000 mg | INTRAMUSCULAR | Status: AC
  Administered 2019-07-08 – 2019-07-12 (×4): 6 mg via INTRAVENOUS
  Filled 2019-07-08 (×4): qty 1

## 2019-07-08 NOTE — Evaluation (Signed)
Clinical/Bedside Swallow Evaluation Patient Details  Name: [name] MRN: [id number] Date of Birth: October 07, 1955  Today's Date: 07/08/2019 Time: SLP Start Time (ACUTE ONLY): 1115 SLP Stop Time (ACUTE ONLY): 1126 SLP Time Calculation (min) (ACUTE ONLY): 11 min  Past Medical History:  Past Medical History:  Diagnosis Date  . Amiodarone toxicity   . Anemia   . Chronic bronchitis   . Chronic systolic CHF (congestive heart failure) ([place])    a. 04/2017 Echo: EF 25%.  . Congenital heart disease   . Diabetes mellitus without complication ([place])    Type II  . Diverticulosis   . Fall due to stumbling October 08, 2013   Due to shoes  . GERD (gastroesophageal reflux disease)   . Gout   . Hemorrhoids   . Hyperlipidemia   . Hyperparathyroidism   . Hypertension   . Hypothyroidism   . IDA (iron deficiency anemia)   . Microscopic colitis 9/11   Colonoscopy  . NICM (nonischemic cardiomyopathy) - tachycardia induced in the setting of AFib ([place])    a. 04/2017 Echo: EF 25%, basal-mid inferolateral, inf AK, triv AI, mild MR, sev dil LA, mod red RV fxn, mildly dil RA, mild to mod TR, PASP 15mmHg.  . Non-obstructive CAD (coronary artery disease)    a. 04/2017 Cath: LM 20p, LCX nl, RCA 20p/m.  Marland Kitchen Paroxysmal SVT (supraventricular tachycardia) ([place])   . Persistent atrial fibrillation ([place]) 06/2013   a. 04/2017 Admit with CHF/NICM/AFib -> CHA2DS2VASc = 4-->Xarelto 15 mg daily started; b. 06/2017 s/p BiV PPM (SJM ser # [id number]) and AVN ablation.  . Presence of permanent cardiac pacemaker 06/29/2017   Biventricular pacemaker placed  . PVD (peripheral vascular disease) ([place]) 09/2004   Right common femoral endarterectomy in April of 2007  . Renal insufficiency   . Schatzki's ring    Last EGD with esophageal dilatation 39F  9/11  . Scoliosis   . Varicose veins right leg pain and swelling   Past Surgical History:  Past Surgical History:  Procedure Laterality Date  . ASD REPAIR  Age 59   [facility]  . AV NODE ABLATION N/A 06/29/2017   Procedure: AV NODE ABLATION;  Surgeon: Evans Lance, MD;  Location: [place] [facility];  Service: Cardiovascular;  Laterality: N/A;  . BIOPSY  02/13/2017   Procedure: BIOPSY;  Surgeon: Daneil Dolin, MD;  [facility];  Service: Endoscopy;;  duodenum gastric  . BIV PACEMAKER INSERTION CRT-P N/A 06/29/2017   Procedure: BIV PACEMAKER INSERTION CRT-P;  Surgeon: Evans Lance, MD;  Location: [place] [facility];  Service: Cardiovascular;  Laterality: N/A;  . CARDIOVERSION N/A 11/13/2013   Procedure: CARDIOVERSION;  Surgeon: Herminio Commons, MD;  Location: [facility];  Service: Endoscopy;  Laterality: N/A;  . COLONOSCOPY  03/04/2010   anal canal hemorrhoids otherwise normal. TI normal. Bx showed lymphocytic colitis. next TCS 02/2020  . COLONOSCOPY  April 2008   Rehman: Pancolonic diverticulosis, external hemorrhoids  . COLONOSCOPY N/A 11/30/2015   RMR: Normal terminal ileum for 10 cm, nonbleeding grade 1 internal hemorrhoids, colonic diverticulosis. Next colonoscopy in 2027.  Marland Kitchen COLONOSCOPY WITH PROPOFOL N/A 02/24/2019   Procedure: COLONOSCOPY WITH PROPOFOL;  Surgeon: Daneil Dolin, MD;  [facility];  Service: Endoscopy;  Laterality: N/A;  11:15am  . ELAS  06-01-11   Right saphenous ELAS   . ESOPHAGEAL DILATION N/A 05/28/2015   Procedure: ESOPHAGEAL DILATION;  Surgeon: Daneil Dolin, MD;  [facility];  Service: Endoscopy;  Laterality: N/A;  . ESOPHAGOGASTRODUODENOSCOPY  03/04/2010   noncritical appearing Schatzki ring. SB bx negative  . ESOPHAGOGASTRODUODENOSCOPY  05/2008   erosive reflux esophagatitis, noncritical Schatzki ring  . ESOPHAGOGASTRODUODENOSCOPY N/A 05/28/2015   [id number] esophagus somewhat baggy, likely due to underlying esophageal motility disorder/small HH   . ESOPHAGOGASTRODUODENOSCOPY N/A 02/13/2017   Dr. Gala Romney: subtly abnormal stomach of doubtful clinical significant s/p  biopsy, normal duodenum s/p biopsy, fundic gland polyp on path, normal duodenal biopsy  . ESOPHAGOGASTRODUODENOSCOPY (EGD) WITH PROPOFOL N/A 02/24/2019   Procedure: ESOPHAGOGASTRODUODENOSCOPY (EGD) WITH PROPOFOL;  Surgeon: Daneil Dolin, MD;  [facility];  Service: Endoscopy;  Laterality: N/A;  . [place] SURGERY  2010  . Left hemithyroidectomy    . Left parathyroidectomy  2007  . MALONEY DILATION N/A 02/24/2019   Procedure: Venia Minks DILATION;  Surgeon: Daneil Dolin, MD;  [facility];  Service: Endoscopy;  Laterality: N/A;  . Parathyroid adenoma  2007  . Right common femoral endarterectomy  2007  . RIGHT/LEFT HEART CATH AND CORONARY ANGIOGRAPHY N/A 05/15/2017   Procedure: RIGHT/LEFT HEART CATH AND CORONARY ANGIOGRAPHY;  Surgeon: Troy Sine, MD;  Location: [place] [facility];  Service: Cardiovascular;  Laterality: N/A;  . Small bowel capsule  12 2009   Mid to distal small bowel with edema, erosions, tiny ulceration felt to be NSAID related  . TONSILLECTOMY     HPI:  64 y.o. female, PMHx including amiodarone toxicity, interstitial lung disease, chronic systolic/diastolic heart failure, GERD, multiple esophageal dilations;  history of congenital heart disease, DM II, iron deficient anemia, hypertension,hyperparathyroidism, peripheral vascular disease, renal insufficiency, persistent A. fib anticoagulated on Xarelto presented to ED w/ shortness of breath, patient was recently diagnosed 12/31 with COVID-19, at baseline has no oxygen requirement, patient reported her symptoms started on 1/3, with progressive dyspnea, fever, chills, cough, neurolyse body ache, denies nausea, vomiting, diarrhea, chest pain, leg edema, or orthopnea. in ED was febrile 103, hypoxic in the mid 80s, requiring initially 6 L nasal cannula, chest x-ray significant for diffuse bilateral opacities, pt admited for COVID-19 for pneumonia.  Pt has had poor PO intake, requires nursing cues to eat/swallow,  MS has been fluctuating.   Assessment / Plan / Recommendation Clinical Impression  Pt presents with functional mechanics of swallowing, with no focal deficits, adequate mastication, the appearance of a brisk swallow, and no s/s of aspiration.  She required cues to initiate activity and was minimally responsive to questions.  No dysphagia identified. D/W RN. Continue current diet to liberalize food options.  No SLP f/u needed.  SLP Visit Diagnosis: Dysphagia, unspecified (R13.10)    Aspiration Risk  No limitations    Diet Recommendation   regular solids, thin liquids  Medication Administration: Whole meds with liquid    Other  Recommendations Oral Care Recommendations: Oral care BID   Follow up Recommendations None      Frequency and Duration     n/a                    Swallow Study   General HPI: 64 y.o. female, PMHx including amiodarone toxicity, interstitial lung disease, chronic systolic/diastolic heart failure, GERD,  history of congenital heart disease, DM II, iron deficient anemia, hypertension,hyperparathyroidism, peripheral vascular disease, renal insufficiency, persistent A. fib anticoagulated on Xarelto presented to ED w/ shortness of breath, patient was recently diagnosed 12/31 with COVID-19, at baseline has no oxygen requirement, patient reported her symptoms  started on 1/3, with progressive dyspnea, fever, chills, cough, neurolyse body ache, denies nausea, vomiting, diarrhea, chest pain, leg edema, or orthopnea. in ED was febrile 103, hypoxic in the mid 80s, requiring initially 6 L nasal cannula, chest x-ray significant for diffuse bilateral opacities, pt admited for COVID-19 for pneumonia.  Pt has had poor PO intake, requires nursing cues to eat/swallow, MS has been fluctuating. Type of Study: Bedside Swallow Evaluation Previous Swallow Assessment: no Diet Prior to this Study: Regular;Thin liquids Temperature Spikes Noted: No Respiratory Status: Nasal  cannula(4L) History of Recent Intubation: No Behavior/Cognition: Alert Oral Cavity Assessment: Dry Oral Care Completed by SLP: No Oral Cavity - Dentition: Adequate natural dentition Vision: Functional for self-feeding Self-Feeding Abilities: Able to feed self Patient Positioning: Upright in bed Baseline Vocal Quality: Hoarse Volitional Cough: Strong Volitional Swallow: Able to elicit    Oral/Motor/Sensory Function Overall Oral Motor/Sensory Function: Within functional limits   Ice Chips Ice chips: Within functional limits   Thin Liquid Thin Liquid: Within functional limits    Nectar Thick Nectar Thick Liquid: Not tested   Honey Thick Honey Thick Liquid: Not tested   Puree Puree: Within functional limits   Solid     Solid: Within functional limits      Juan Quam Laurice 07/08/2019,1:08 PM  Estill Bamberg L. Tivis Ringer, [place] Office number [phone]

## 2019-07-08 NOTE — Progress Notes (Addendum)
PROGRESS NOTE                                                                                                                                                                                                             Patient Demographics:    [name], is a 64 y.o. female, DOB - 1956-02-12, [id number]  Outpatient Primary MD for the patient is Celene Squibb, MD   Admit date - 06/27/2019   LOS - 6  Chief Complaint  Patient presents with  . Shortness of Breath  . covid pos       Brief Narrative: Patient is a 64 y.o. 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  Post admission-she had significant worsening of her hypoxemia-requiring 100% NRB-she was then transferred to [facility].  See below for further details.   Subjective:   Oxygen stable on 4 L-however she appears very weak and deconditioned.  She is slightly confused but can be redirected (but is hard of hearing-hearing aids at home).   Assessment  & Plan :   Acute Hypoxic Resp Failure due to Covid 19 Viral pneumonia and concurrent bacterial pneumonia: Slowly improving-Down to 4 L of oxygen this morning.  She appears incredibly weak and frail.  Continue steroids and cefepime (plan a 7-day course).  Completed remdesivir on 1/10.  Continue close monitoring-at risk of further worsening because of significant frailty and weakness.  Obtain SLP to ensure she is on the right consistency diet-and no occult aspiration.   Fever: afebrile  O2 requirements:  SpO2: 98 % O2  Flow Rate (L/min): 4 L/min   COVID-19 Labs: Recent Labs    07/06/19 0330 07/07/19 0352 07/08/19 0335  DDIMER 4.18* 3.89* 3.29*  FERRITIN  --   --  836*  CRP 4.9* 2.9* 1.7*       Component Value Date/Time   BNP 1,321.0 (H) 07/17/2019 1455    Recent Labs  Lab 07/01/2019 1454 07/07/19 1019 07/08/19 0335  PROCALCITON 6.29 0.61 0.28    Lab Results  Component Value Date   SARSCOV2NAA Detected (A) 06/26/2019   [place] NEGATIVE 02/20/2019   [place] Not Detected 12/18/2018     COVID-19 Medications: Steroids: 1/6>> Remdesivir: 1/6>>1/10 Convalescent Plasma: 1/7   Other medications: Diuretics:Euvolemic-hold Lasix Antibiotics: Cefepime: 1/6>> Vancomycin: 1/6>>1/8  Prone/Incentive Spirometry: encouraged  incentive spirometry use 3-4/hour.  DVT Prophylaxis  : Xarelto  AKI on CKD stage IIIa: AKI likely hemodynamically mediated-volume status is stable-continue to hold diuretics.  Creatinine is essentially unchanged than yesterday-and still slightly higher than the usual baseline.  Follow.  Hypernatremia: We will start gentle hydration with D5W-no signs of volume overload at this point.  Repeat electrolytes tomorrow.  Continue to hold diuretics.    Acute metabolic encephalopathy: Slightly confused today-suspect from hypoxemia and hypernatremia/AKI.  She is hard of hearing-that may be clouding the picture a bit.  Supportive care for now.  HTN: BP controlled-continue metoprolol  Persistent atrial fibrillation s/p AV node ablation and PPM insertion: Continue metoprolol-on Xarelto.  Rate under good control.  Paced rhythm on telemetry.  Note has history of amiodarone induced lung toxicity  Chronic combined systolic and diastolic heart failure: Euvolemic-hold Lasix.  Bronchial asthma: Appears stable-no rhonchi-continue bronchodilators.  DM-2 A1c 6.8): CBGs stable-continue SSI-Lantus remains on hold.    CBG (last 3)  Recent Labs    07/07/19 1939 07/08/19 0458  07/08/19 0748  GLUCAP 111* 109* 125*   Hypothyroidism: Continue levothyroxine  History of amiodarone induced lung toxicity/ILD: Supportive care.  Remote history of ASD repair  Deconditioning/debility: Appears very weak from acute illness-plans are for SNF when she is closer to discharge.  Consults  :  PCCM  Procedures  :  None  ABG:    Component Value Date/Time   PHART 7.427 05/15/2017 1158   PCO2ART 37.7 05/15/2017 1158   PO2ART 61.0 (L) 05/15/2017 1158   HCO3 24.9 05/15/2017 1158   TCO2 26 05/15/2017 1158   O2SAT 92.0 05/15/2017 1158    Vent Settings: N/A  Condition -stable  Family Communication  :  Brother-updated over the phone on 1/12  Code Status :  Full Code  Diet :  Diet Order            Diet heart healthy/carb modified Room service appropriate? Yes; Fluid consistency: Thin  Diet effective now               Disposition Plan  :  Remain hospitalized-SNF when she is stable for discharge.  Barriers to discharge: Hypoxia requiring O2 supplementation Antimicorbials  :    Anti-infectives (From admission, onward)   Start     Dose/Rate Route Frequency Ordered Stop   07/04/19 1830  vancomycin (VANCOREADY) IVPB 750 mg/150 mL  Status:  Discontinued     750 mg 150 mL/hr over 60 Minutes Intravenous Every 48 hours 07/03/19 1249 07/04/19 1110   07/04/19 1800  vancomycin (VANCOCIN) IVPB 1000 mg/200 mL premix  Status:  Discontinued     1,000 mg 200 mL/hr over 60 Minutes Intravenous Every 48 hours 07/07/2019 1727 07/03/19 1249   07/03/19 1800  ceFEPIme (MAXIPIME) 1 g in sodium chloride 0.9 % 100 mL IVPB  Status:  Discontinued     1 g 200 mL/hr over 30 Minutes Intravenous Every 24 hours 07/08/2019 1725 07/03/19 1248   07/03/19 1800  ceFEPIme (MAXIPIME) 2 g in sodium chloride 0.9 % 100 mL IVPB     2 g 200 mL/hr over 30 Minutes Intravenous Every 24 hours 07/03/19 1247 07/08/19 2359   07/03/19 1000  remdesivir 100 mg  in sodium chloride 0.9 % 100 mL IVPB     100 mg 200  mL/hr over 30 Minutes Intravenous Daily 06/27/2019 1723 07/06/19 0926   07/03/19 1000  remdesivir 100 mg in sodium chloride 0.9 % 100 mL IVPB  Status:  Discontinued     100 mg 200 mL/hr over 30 Minutes Intravenous Daily 07/06/2019 2355 07/03/19 0257   07/06/2019 2354  remdesivir 200 mg in sodium chloride 0.9% 250 mL IVPB  Status:  Discontinued     200 mg 580 mL/hr over 30 Minutes Intravenous Once 07/14/2019 2355 07/03/19 0257   07/01/2019 1800  remdesivir 200 mg in sodium chloride 0.9% 250 mL IVPB     200 mg 580 mL/hr over 30 Minutes Intravenous Once 07/05/2019 1723 07/03/2019 2306   07/01/2019 1730  vancomycin (VANCOCIN) IVPB 1000 mg/200 mL premix  Status:  Discontinued     1,000 mg 200 mL/hr over 60 Minutes Intravenous  Once 07/03/2019 1722 07/01/2019 1725   07/06/2019 1730  ceFEPIme (MAXIPIME) 2 g in sodium chloride 0.9 % 100 mL IVPB     2 g 200 mL/hr over 30 Minutes Intravenous  Once 07/05/2019 1722 07/05/2019 1918   07/10/2019 1730  vancomycin (VANCOREADY) IVPB 1500 mg/300 mL     1,500 mg 150 mL/hr over 120 Minutes Intravenous  Once 07/19/2019 1725 07/10/2019 2036      Inpatient Medications  Scheduled Meds: . sodium chloride   Intravenous Once  . vitamin C  500 mg Oral Daily  . dexamethasone (DECADRON) injection  10 mg Intravenous Q24H  . fluticasone furoate-vilanterol  2 puff Inhalation Daily  . insulin aspart  0-20 Units Subcutaneous TID WC  . insulin aspart  4 Units Subcutaneous TID WC  . levothyroxine  100 mcg Oral QAC breakfast  . metoprolol succinate  50 mg Oral Daily  . pantoprazole  40 mg Oral BID  . pravastatin  10 mg Oral Daily  . Rivaroxaban  15 mg Oral Q breakfast  . zinc sulfate  220 mg Oral Daily   Continuous Infusions: . ceFEPime (MAXIPIME) IV Stopped (07/07/19 1825)  . dextrose 50 mL/hr at 07/08/19 0751   PRN Meds:.acetaminophen, albuterol, chlorpheniramine-HYDROcodone, guaiFENesin-dextromethorphan, lip balm, senna-docusate   Time Spent in minutes 25  See all Orders from today for  further details   Oren Binet M.D on 07/08/2019 at 11:50 AM  To page go to [email or web address] - use universal password  [facility] -  Office  [phone]    Objective:   Vitals:   07/08/19 0420 07/08/19 0700 07/08/19 0900 07/08/19 1110  BP: (!) 131/92 123/73  118/71  Pulse: 78 71  84  Resp: (!) 25 (!) 22  19  Temp: 98 F (36.7 C) 98.4 F (36.9 C)  98.2 F (36.8 C)  TempSrc: Axillary Oral  Oral  SpO2: 98% 96% 92% 98%  Weight:      Height:        Wt Readings from Last 3 Encounters:  06/29/2019 59.9 kg  04/01/19 65.8 kg  04/01/19 64.4 kg     Intake/Output Summary (Last 24 hours) at 07/08/2019 1150 Last data filed at 07/08/2019 0900 Gross per 24 hour  Intake 594.62 ml  Output 200 ml  Net 394.62 ml     Physical Exam Gen Exam: Very weak and deconditioned-hard of hearing-appears confused at times-but is redirectable. HEENT:atraumatic, normocephalic Chest: B/L clear to auscultation anteriorly CVS:S1S2 regular Abdomen:soft non tender, non distended Extremities:no edema Neurology: Non focal-but with generalized weakness Skin: no rash   Data  Review:    CBC Recent Labs  Lab 07/03/19 0804 07/04/19 0455 07/05/19 0530 07/06/19 0330 07/07/19 0352 07/08/19 0335  WBC 4.1 8.2 13.7* 15.1* 16.8* 17.5*  HGB 14.4 14.6 15.3* 15.5* 16.1* 15.6*  HCT 47.0* 45.5 47.6* 47.3* 49.0* 47.9*  PLT 236 288 335 291 255 254  MCV 99.8 96.0 95.2 93.7 95.1 94.9  MCH 30.6 30.8 30.6 30.7 31.3 30.9  MCHC 30.6 32.1 32.1 32.8 32.9 32.6  RDW 13.3 13.6 13.7 13.6 13.9 14.2  LYMPHSABS 0.5* 0.6* 0.5* 0.6* 0.7  --   MONOABS 0.1 0.4 0.3 0.4 0.6  --   EOSABS 0.0 0.0 0.0 0.0 0.0  --   BASOSABS 0.0 0.0 0.0 0.1 0.1  --     Chemistries  Recent Labs  Lab 07/04/19 0455 07/05/19 0530 07/06/19 0330 07/07/19 0352 07/08/19 0335  NA 142 140 141 149* 150*  K 4.1 4.1 4.3 3.9 4.3  CL 107 106 111 109 114*  CO2 21* 21* 20* 23 22  GLUCOSE 230* 323* 108* 66* 80  BUN 76* 77* 72* 80* 85*    CREATININE 2.33* 1.96* 1.52* 1.75* 1.76*  CALCIUM 8.6* 8.9 9.2 9.1 9.2  AST 42* 35 33 28 24  ALT 26 25 24 23 23   ALKPHOS 95 105 110 115 117  BILITOT 0.8 1.0 1.0 1.2 1.3*   ------------------------------------------------------------------------------------------------------------------ No results for input(s): CHOL, HDL, LDLCALC, TRIG, CHOLHDL, LDLDIRECT in the last 72 hours.  Lab Results  Component Value Date   HGBA1C 6.8 (H) 07/06/2019   ------------------------------------------------------------------------------------------------------------------ No results for input(s): TSH, T4TOTAL, T3FREE, THYROIDAB in the last 72 hours.  Invalid input(s): FREET3 ------------------------------------------------------------------------------------------------------------------ Recent Labs    07/08/19 0335  FERRITIN 836*    Coagulation profile No results for input(s): INR, PROTIME in the last 168 hours.  Recent Labs    07/07/19 0352 07/08/19 0335  DDIMER 3.89* 3.29*    Cardiac Enzymes No results for input(s): CKMB, TROPONINI, MYOGLOBIN in the last 168 hours.  Invalid input(s): CK ------------------------------------------------------------------------------------------------------------------    Component Value Date/Time   BNP 1,321.0 (H) 07/27/2019 1455    Micro Results Recent Results (from the past 240 hour(s))  Blood Culture (routine x 2)     Status: None   Collection Time: 07/27/2019  2:54 PM   Specimen: Left Antecubital; Blood  Result Value Ref Range Status   Specimen Description LEFT ANTECUBITAL  Final   Special Requests   Final    BOTTLES DRAWN AEROBIC AND ANAEROBIC Blood Culture adequate volume   Culture   Final    NO GROWTH 5 DAYS Performed at [facility], [address] [place], [place] [address]    Report Status 07/07/2019 FINAL  Final  Blood Culture (routine x 2)     Status: None   Collection Time: 07/06/2019  4:34 PM   Specimen: BLOOD LEFT ARM  Result  Value Ref Range Status   Specimen Description BLOOD LEFT ARM  Final   Special Requests   Final    BOTTLES DRAWN AEROBIC AND ANAEROBIC Blood Culture adequate volume   Culture   Final    NO GROWTH 5 DAYS Performed at [facility], [address] [place], [place] [address]    Report Status 07/07/2019 FINAL  Final    Radiology Reports DG Chest Port 1 View  Result Date: 07/03/2019 CLINICAL DATA:  COVID-19 positive.  Worsening shortness of breath. EXAM: PORTABLE CHEST 1 VIEW COMPARISON:  07/24/2019. FINDINGS: Surgical clips noted over the neck. AICD in stable position. Stable cardiomegaly.  Diffuse severe bilateral pulmonary infiltrates again noted. Slight improvement in aeration on today's exam. No pleural effusion pneumothorax. Old infarct proximal right humerus. No acute bony abnormality. IMPRESSION: 1.  AICD in stable position.  Stable cardiomegaly. 2. Diffuse severe bilateral pulmonary infiltrates again noted. Slight improvement in aeration on today's exam. Electronically Signed   By: Marcello Moores  Register   On: 07/03/2019 07:15   DG Chest Port 1 View  Result Date: 07/06/2019 CLINICAL DATA:  Hypoxia. EXAM: PORTABLE CHEST 1 VIEW COMPARISON:  None. FINDINGS: There is a dual lead AICD. Moderate severity diffuse bilateral infiltrates are seen. There is a small right pleural effusion. No pneumothorax is identified. The cardiac silhouette is markedly enlarged. Multiple radiopaque surgical clips are seen overlying the superior mediastinum. There is marked severity levoscoliosis of the thoracic spine with moderate severity multilevel degenerative changes. IMPRESSION: 1. Moderate severity diffuse bilateral infiltrates with a small right pleural effusion. 2. Marked enlargement of the cardiac silhouette. 3. Dual lead AICD in place. Electronically Signed   By: Virgina Norfolk M.D.   On: 06/29/2019 15:50   DG Chest Port 1V today  Result Date: 07/06/2019 CLINICAL DATA:  Dyspnea, COVID-19 positive EXAM: PORTABLE  CHEST 1 VIEW COMPARISON:  07/03/2019 chest radiograph. FINDINGS: Stable configuration of 2 lead left subclavian pacemaker. Surgical clips overlie the central lower neck. Stable cardiomediastinal silhouette with mild cardiomegaly. No pneumothorax. No pleural effusion. Patchy hazy opacities throughout the mid to lower lungs bilaterally, similar to minimally improved. IMPRESSION: Patchy hazy opacities throughout the mid to lower lungs bilaterally, similar to minimally improved, compatible with COVID-19 pneumonia. Stable cardiomegaly. Electronically Signed   By: Ilona Sorrel M.D.   On: 07/06/2019 13:09   MM 3D SCREEN BREAST BILATERAL  Result Date: 06/12/2019 CLINICAL DATA:  Screening. EXAM: DIGITAL SCREENING BILATERAL MAMMOGRAM WITH TOMO AND CAD COMPARISON:  Previous exam(s). ACR Breast Density Category a: The breast tissue is almost entirely fatty. FINDINGS: There are no findings suspicious for malignancy. Images were processed with CAD. IMPRESSION: No mammographic evidence of malignancy. A result letter of this screening mammogram will be mailed directly to the patient. RECOMMENDATION: Screening mammogram in one year. (Code:SM-B-01Y) BI-RADS CATEGORY  1: Negative. Electronically Signed   By: Nolon Nations M.D.   On: 06/12/2019 16:42

## 2019-07-09 ENCOUNTER — Inpatient Hospital Stay (HOSPITAL_COMMUNITY): Payer: PPO

## 2019-07-09 LAB — BASIC METABOLIC PANEL
Anion gap: 14 (ref 5–15)
BUN: 78 mg/dL — ABNORMAL HIGH (ref 8–23)
CO2: 22 mmol/L (ref 22–32)
Calcium: 9.2 mg/dL (ref 8.9–10.3)
Chloride: 113 mmol/L — ABNORMAL HIGH (ref 98–111)
Creatinine, Ser: 1.72 mg/dL — ABNORMAL HIGH (ref 0.44–1.00)
GFR calc Af Amer: 36 mL/min — ABNORMAL LOW (ref >60)
GFR calc non Af Amer: 31 mL/min — ABNORMAL LOW (ref >60)
Glucose, Bld: 151 mg/dL — ABNORMAL HIGH (ref 70–99)
Potassium: 4.1 mmol/L (ref 3.5–5.1)
Sodium: 149 mmol/L — ABNORMAL HIGH (ref 135–145)

## 2019-07-09 LAB — GLUCOSE, CAPILLARY
Glucose-Capillary: 122 mg/dL — ABNORMAL HIGH (ref 70–99)
Glucose-Capillary: 127 mg/dL — ABNORMAL HIGH (ref 70–99)
Glucose-Capillary: 137 mg/dL — ABNORMAL HIGH (ref 70–99)
Glucose-Capillary: 198 mg/dL — ABNORMAL HIGH (ref 70–99)
Glucose-Capillary: 236 mg/dL — ABNORMAL HIGH (ref 70–99)

## 2019-07-09 LAB — COMPREHENSIVE METABOLIC PANEL
ALT: 23 U/L (ref 0–44)
AST: 33 U/L (ref 15–41)
Albumin: 2.4 g/dL — ABNORMAL LOW (ref 3.5–5.0)
Alkaline Phosphatase: 148 U/L — ABNORMAL HIGH (ref 38–126)
Anion gap: 11 (ref 5–15)
BUN: 84 mg/dL — ABNORMAL HIGH (ref 8–23)
CO2: 18 mmol/L — ABNORMAL LOW (ref 22–32)
Calcium: 8.8 mg/dL — ABNORMAL LOW (ref 8.9–10.3)
Chloride: 118 mmol/L — ABNORMAL HIGH (ref 98–111)
Creatinine, Ser: 1.76 mg/dL — ABNORMAL HIGH (ref 0.44–1.00)
GFR calc Af Amer: 35 mL/min — ABNORMAL LOW (ref >60)
GFR calc non Af Amer: 30 mL/min — ABNORMAL LOW (ref >60)
Glucose, Bld: 177 mg/dL — ABNORMAL HIGH (ref 70–99)
Potassium: 5.7 mmol/L — ABNORMAL HIGH (ref 3.5–5.1)
Sodium: 147 mmol/L — ABNORMAL HIGH (ref 135–145)
Total Bilirubin: 1.6 mg/dL — ABNORMAL HIGH (ref 0.3–1.2)
Total Protein: 5.8 g/dL — ABNORMAL LOW (ref 6.5–8.1)

## 2019-07-09 LAB — CBC
HCT: 45.5 % (ref 36.0–46.0)
Hemoglobin: 14.9 g/dL (ref 12.0–15.0)
MCH: 31 pg (ref 26.0–34.0)
MCHC: 32.7 g/dL (ref 30.0–36.0)
MCV: 94.6 fL (ref 80.0–100.0)
Platelets: 250 10*3/uL (ref 150–400)
RBC: 4.81 MIL/uL (ref 3.87–5.11)
RDW: 14.2 % (ref 11.5–15.5)
WBC: 17 10*3/uL — ABNORMAL HIGH (ref 4.0–10.5)
nRBC: 0.1 % (ref 0.0–0.2)

## 2019-07-09 LAB — D-DIMER, QUANTITATIVE: D-Dimer, Quant: 3.57 ug/mL-FEU — ABNORMAL HIGH (ref 0.00–0.50)

## 2019-07-09 LAB — C-REACTIVE PROTEIN: CRP: 1.3 mg/dL — ABNORMAL HIGH (ref <1.0)

## 2019-07-09 LAB — FERRITIN: Ferritin: 970 ng/mL — ABNORMAL HIGH (ref 11–307)

## 2019-07-09 MED ORDER — SODIUM ZIRCONIUM CYCLOSILICATE 10 G PO PACK
10.0000 g | PACK | Freq: Every day | ORAL | Status: DC
  Administered 2019-07-09 – 2019-07-18 (×10): 10 g via ORAL
  Filled 2019-07-09 (×10): qty 1

## 2019-07-09 MED ORDER — DEXTROSE 50 % IV SOLN
1.0000 | Freq: Once | INTRAVENOUS | Status: AC
  Administered 2019-07-09: 50 mL via INTRAVENOUS
  Filled 2019-07-09: qty 50

## 2019-07-09 MED ORDER — DEXTROSE 5 % IV SOLN: INTRAVENOUS | Status: DC

## 2019-07-09 MED ORDER — INSULIN ASPART 100 UNIT/ML IV SOLN
6.0000 [IU] | Freq: Once | INTRAVENOUS | Status: AC
  Administered 2019-07-09: 6 [IU] via INTRAVENOUS

## 2019-07-09 NOTE — Progress Notes (Signed)
Andria Frames MD paged and notified of pt's c/o SOB and increased O2 requirement. She was previously on 4L HFNC and now on 10L HFNC with O2 sat of 90%.

## 2019-07-09 NOTE — Progress Notes (Signed)
PROGRESS NOTE                                                                                                                                                                                                             Patient Demographics:    [name], is a 64 y.o. female, DOB - 05-03-1956, DH:[id number]  Outpatient Primary MD for the patient is Celene Squibb, MD   Admit date - 07/01/2019   LOS - 7  Chief Complaint  Patient presents with  . Shortness of Breath  . covid pos       Brief Narrative: Patient is a 64 y.o. 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  Post admission-she had significant worsening of her hypoxemia-requiring 100% NRB-she was then transferred to [facility]  See below for further details.   Subjective:   He remains stable on 4 L of oxygen.  Continues to appear to be very weak and deconditioned.  Is hard of hearing but able to answer most of my questions appropriately.   Assessment  & Plan :   Acute Hypoxic Resp Failure due to Covid 19 Viral pneumonia and concurrent bacterial pneumonia: Slow improvement continues-remains stable on just 4 L of oxygen.  Remdesivir on 1/10, completed cefepime on 1/12.  Continue supportive care-she remains incredibly frail and deconditioned.  Probably SNF on discharge.    Fever: afebrile  O2 requirements:  SpO2: 90 % O2 Flow Rate (L/min): 4 L/min   COVID-19 Labs: Recent Labs    07/07/19 0352 07/08/19 0335 07/09/19 0513  DDIMER 3.89* 3.29* 3.57*  FERRITIN   --  836* 970*  CRP 2.9* 1.7* 1.3*       Component Value Date/Time   BNP 1,321.0 (H) 06/28/2019 1455    Recent Labs  Lab 07/07/19 1019 07/08/19 0335  PROCALCITON 0.61 0.28    Lab Results  Component Value Date   SARSCOV2NAA Detected (A) 06/26/2019   [place] NEGATIVE 02/20/2019   [place] Not Detected 12/18/2018     COVID-19 Medications:  Steroids: 1/6>> Remdesivir: 1/6>>1/10 Convalescent Plasma: 1/7   Other medications: Diuretics:Euvolemic-hold Lasix Antibiotics: Cefepime: 1/6>>1/12 Vancomycin: 1/6>>1/8  Prone/Incentive Spirometry: encouraged  incentive spirometry use 3-4/hour.  DVT Prophylaxis  : Xarelto  AKI on CKD stage IIIa: AKI likely hemodynamically mediated-creatinine has plateaued to around 1.7 range.  Continue supportive care.  Hypernatremia: Continue gentle hydration with hypotonic saline.  Repeat electrolytes.  Hyperkalemia: We will give 1 dose of D50/insulin-start Lokelma-repeat electrolytes  Acute metabolic encephalopathy: Slightly confused today-suspect from hypoxemia and hypernatremia/AKI.  She is hard of hearing-that may be clouding the picture a bit.  Supportive care for now.  HTN: BP controlled-continue metoprolol  Persistent atrial fibrillation s/p AV node ablation and PPM insertion: Continue metoprolol-on Xarelto.  Rate under good control.  Paced rhythm on telemetry.  Note has history of amiodarone induced lung toxicity  Chronic combined systolic and diastolic heart failure: Euvolemic-hold Lasix.  Bronchial asthma: Appears stable-no rhonchi-continue bronchodilators.  DM-2 A1c 6.8): CBGs stable-continue SSI-Lantus remains on hold.    CBG (last 3)  Recent Labs    07/08/19 2002 07/09/19 0007 07/09/19 0723  GLUCAP 240* 122* 198*   Hypothyroidism: Continue levothyroxine  History of amiodarone induced lung toxicity/ILD: Supportive care.  Remote history of ASD repair  Deconditioning/debility: Appears very weak from acute  illness-plans are for SNF when she is closer to discharge.  Consults  :  PCCM  Procedures  :  None  ABG:    Component Value Date/Time   PHART 7.427 05/15/2017 1158   PCO2ART 37.7 05/15/2017 1158   PO2ART 61.0 (L) 05/15/2017 1158   HCO3 24.9 05/15/2017 1158   TCO2 26 05/15/2017 1158   O2SAT 92.0 05/15/2017 1158    Vent Settings: N/A  Condition -stable  Family Communication  :  Brother-updated over the phone on 1/13  Code Status :  Full Code  Diet :  Diet Order            Diet heart healthy/carb modified Room service appropriate? Yes; Fluid consistency: Thin  Diet effective now               Disposition Plan  :  Remain hospitalized-SNF when she is stable for discharge.  Barriers to discharge: Hypoxia requiring O2 supplementation Antimicorbials  :    Anti-infectives (From admission, onward)   Start     Dose/Rate Route Frequency Ordered Stop   07/04/19 1830  vancomycin (VANCOREADY) IVPB 750 mg/150 mL  Status:  Discontinued     750 mg 150 mL/hr over 60 Minutes Intravenous Every 48 hours 07/03/19 1249 07/04/19 1110   07/04/19 1800  vancomycin (VANCOCIN) IVPB 1000 mg/200 mL premix  Status:  Discontinued     1,000 mg 200 mL/hr over 60 Minutes Intravenous Every 48 hours 07/13/2019 1727 07/03/19 1249   07/03/19 1800  ceFEPIme (MAXIPIME) 1 g in sodium chloride 0.9 % 100 mL IVPB  Status:  Discontinued     1 g 200 mL/hr over 30 Minutes Intravenous Every 24 hours 07/21/2019 1725 07/03/19 1248   07/03/19 1800  ceFEPIme (MAXIPIME) 2 g in sodium chloride 0.9 % 100 mL IVPB     2 g 200 mL/hr over 30 Minutes Intravenous Every 24 hours 07/03/19 1247 07/08/19 1850   07/03/19 1000  remdesivir 100 mg in sodium chloride 0.9 % 100 mL IVPB     100 mg 200 mL/hr over 30 Minutes Intravenous Daily 07/01/2019 1723 07/06/19 0926   07/03/19 1000  remdesivir 100 mg in sodium chloride 0.9 % 100 mL IVPB  Status:  Discontinued     100 mg 200 mL/hr over 30 Minutes Intravenous Daily 07/17/2019 2355  07/03/19 0257   07/20/2019 2354  remdesivir 200 mg in sodium chloride 0.9% 250 mL IVPB  Status:  Discontinued     200 mg 580 mL/hr over 30 Minutes Intravenous Once 07/04/2019 2355 07/03/19 0257   07/13/2019 1800  remdesivir 200 mg in sodium chloride 0.9% 250 mL IVPB     200 mg 580 mL/hr over 30 Minutes Intravenous Once 07/24/2019 1723 07/06/2019 2306   06/30/2019 1730  vancomycin (VANCOCIN) IVPB 1000 mg/200 mL premix  Status:  Discontinued     1,000 mg 200 mL/hr over 60 Minutes Intravenous  Once 07/01/2019 1722 07/27/2019 1725   07/07/2019 1730  ceFEPIme (MAXIPIME) 2 g in sodium chloride 0.9 % 100 mL IVPB     2 g 200 mL/hr over 30 Minutes Intravenous  Once 06/30/2019 1722 07/09/2019 1918   07/05/2019 1730  vancomycin (VANCOREADY) IVPB 1500 mg/300 mL     1,500 mg 150 mL/hr over 120 Minutes Intravenous  Once 07/20/2019 1725 07/27/2019 2036      Inpatient Medications  Scheduled Meds: . sodium chloride   Intravenous Once  . vitamin C  500 mg Oral Daily  . dexamethasone (DECADRON) injection  6 mg Intravenous Q24H  . fluticasone furoate-vilanterol  2 puff Inhalation Daily  . insulin aspart  0-20 Units Subcutaneous TID WC  . insulin aspart  4 Units Subcutaneous TID WC  . levothyroxine  100 mcg Oral QAC breakfast  . metoprolol succinate  50 mg Oral Daily  . pantoprazole  40 mg Oral BID  . pravastatin  10 mg Oral Daily  . Rivaroxaban  15 mg Oral Q breakfast  . sodium zirconium cyclosilicate  10 g Oral Daily  . zinc sulfate  220 mg Oral Daily   Continuous Infusions: . dextrose 75 mL/hr at 07/09/19 1543   PRN Meds:.acetaminophen, albuterol, chlorpheniramine-HYDROcodone, guaiFENesin-dextromethorphan, lip balm, senna-docusate   Time Spent in minutes 25  See all Orders from today for further details   Oren Binet M.D on 07/09/2019 at 4:11 PM  To page go to [email or web address] - use universal password  [facility] -  Office  [phone]    Objective:   Vitals:   07/09/19 0806 07/09/19 1221 07/09/19  1256 07/09/19 1258  BP: 128/86 127/84    Pulse: 70 75 74   Resp: (!) 21 (!) 25 (!) 25   Temp: (!) 97.3 F (36.3 C) 97.9 F (36.6 C)    TempSrc: Oral Oral    SpO2:  93% 94% 90%  Weight:      Height:        Wt Readings from Last 3 Encounters:  07/20/2019 59.9 kg  04/01/19 65.8 kg  04/01/19 64.4 kg     Intake/Output Summary (Last 24 hours) at 07/09/2019 1611 Last data filed at 07/09/2019 1300 Gross per 24 hour  Intake 1690 ml  Output 527 ml  Net 1163 ml     Physical Exam Gen Exam: Very weak and deconditioned-hard of hearing-appears confused at times-but is redirectable. HEENT:atraumatic, normocephalic Chest: B/L clear to auscultation anteriorly CVS:S1S2 regular Abdomen:soft non tender, non distended Extremities:no edema Neurology: Non focal-but with generalized weakness Skin: no rash   Data Review:    CBC Recent Labs  Lab 07/03/19 0804 07/04/19 0455 07/05/19 0530 07/06/19 0330 07/07/19 0352 07/08/19 0335 07/09/19 0513  WBC 4.1 8.2 13.7* 15.1* 16.8* 17.5* 17.0*  HGB 14.4 14.6 15.3* 15.5* 16.1* 15.6* 14.9  HCT 47.0*  45.5 47.6* 47.3* 49.0* 47.9* 45.5  PLT 236 288 335 291 255 254 250  MCV 99.8 96.0 95.2 93.7 95.1 94.9 94.6  MCH 30.6 30.8 30.6 30.7 31.3 30.9 31.0  MCHC 30.6 32.1 32.1 32.8 32.9 32.6 32.7  RDW 13.3 13.6 13.7 13.6 13.9 14.2 14.2  LYMPHSABS 0.5* 0.6* 0.5* 0.6* 0.7  --   --   MONOABS 0.1 0.4 0.3 0.4 0.6  --   --   EOSABS 0.0 0.0 0.0 0.0 0.0  --   --   BASOSABS 0.0 0.0 0.0 0.1 0.1  --   --     Chemistries  Recent Labs  Lab 07/05/19 0530 07/06/19 0330 07/07/19 0352 07/08/19 0335 07/09/19 0513  NA 140 141 149* 150* 147*  K 4.1 4.3 3.9 4.3 5.7*  CL 106 111 109 114* 118*  CO2 21* 20* 23 22 18*  GLUCOSE 323* 108* 66* 80 177*  BUN 77* 72* 80* 85* 84*  CREATININE 1.96* 1.52* 1.75* 1.76* 1.76*  CALCIUM 8.9 9.2 9.1 9.2 8.8*  AST 35 33 28 24 33  ALT 25 24 23 23 23   ALKPHOS 105 110 115 117 148*  BILITOT 1.0 1.0 1.2 1.3* 1.6*    ------------------------------------------------------------------------------------------------------------------ No results for input(s): CHOL, HDL, LDLCALC, TRIG, CHOLHDL, LDLDIRECT in the last 72 hours.  Lab Results  Component Value Date   HGBA1C 6.8 (H) 07/06/2019   ------------------------------------------------------------------------------------------------------------------ No results for input(s): TSH, T4TOTAL, T3FREE, THYROIDAB in the last 72 hours.  Invalid input(s): FREET3 ------------------------------------------------------------------------------------------------------------------ Recent Labs    07/08/19 0335 07/09/19 0513  FERRITIN 836* 970*    Coagulation profile No results for input(s): INR, PROTIME in the last 168 hours.  Recent Labs    07/08/19 0335 07/09/19 0513  DDIMER 3.29* 3.57*    Cardiac Enzymes No results for input(s): CKMB, TROPONINI, MYOGLOBIN in the last 168 hours.  Invalid input(s): CK ------------------------------------------------------------------------------------------------------------------    Component Value Date/Time   BNP 1,321.0 (H) 07/10/2019 1455    Micro Results Recent Results (from the past 240 hour(s))  Blood Culture (routine x 2)     Status: None   Collection Time: 06/28/2019  2:54 PM   Specimen: Left Antecubital; Blood  Result Value Ref Range Status   Specimen Description LEFT ANTECUBITAL  Final   Special Requests   Final    BOTTLES DRAWN AEROBIC AND ANAEROBIC Blood Culture adequate volume   Culture   Final    NO GROWTH 5 DAYS Performed at [facility], [address], [place], [place] [address]    Report Status 07/07/2019 FINAL  Final  Blood Culture (routine x 2)     Status: None   Collection Time: 07/21/2019  4:34 PM   Specimen: BLOOD LEFT ARM  Result Value Ref Range Status   Specimen Description BLOOD LEFT ARM  Final   Special Requests   Final    BOTTLES DRAWN AEROBIC AND ANAEROBIC Blood Culture  adequate volume   Culture   Final    NO GROWTH 5 DAYS Performed at [facility], [address], [place], [place] [address]    Report Status 07/07/2019 FINAL  Final    Radiology Reports DG Chest Port 1 View  Result Date: 07/03/2019 CLINICAL DATA:  COVID-19 positive.  Worsening shortness of breath. EXAM: PORTABLE CHEST 1 VIEW COMPARISON:  07/13/2019. FINDINGS: Surgical clips noted over the neck. AICD in stable position. Stable cardiomegaly. Diffuse severe bilateral pulmonary infiltrates again noted. Slight improvement in aeration on today's exam. No pleural effusion pneumothorax. 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 No acute bony abnormality. IMPRESSION: 1.  AICD in stable position.  Stable cardiomegaly. 2. Diffuse severe bilateral pulmonary infiltrates again noted. Slight improvement in aeration on today's exam. Electronically Signed   By: Marcello Moores  Register   On: 07/03/2019 07:15   DG Chest Port 1 View  Result Date: 07/03/2019 CLINICAL DATA:  Hypoxia. EXAM: PORTABLE CHEST 1 VIEW COMPARISON:  None. FINDINGS: There is a dual lead AICD. Moderate severity diffuse bilateral infiltrates are seen. There is a small right pleural effusion. No pneumothorax is identified. The cardiac silhouette is markedly enlarged. Multiple radiopaque surgical clips are seen overlying the superior mediastinum. There is marked severity levoscoliosis of the thoracic spine with moderate severity multilevel degenerative changes. IMPRESSION: 1. Moderate severity diffuse bilateral infiltrates with a small right pleural effusion. 2. Marked enlargement of the cardiac silhouette. 3. Dual lead AICD in place. Electronically Signed   By: Virgina Norfolk M.D.   On: 07/18/2019 15:50   DG Chest Port 1V today  Result Date: 07/06/2019 CLINICAL DATA:  Dyspnea, COVID-19 positive EXAM: PORTABLE CHEST 1 VIEW COMPARISON:  07/03/2019 chest radiograph. FINDINGS: Stable configuration of 2 lead left subclavian pacemaker. Surgical clips overlie the  central lower neck. Stable cardiomediastinal silhouette with mild cardiomegaly. No pneumothorax. No pleural effusion. Patchy hazy opacities throughout the mid to lower lungs bilaterally, similar to minimally improved. IMPRESSION: Patchy hazy opacities throughout the mid to lower lungs bilaterally, similar to minimally improved, compatible with COVID-19 pneumonia. Stable cardiomegaly. Electronically Signed   By: Ilona Sorrel M.D.   On: 07/06/2019 13:09   MM 3D SCREEN BREAST BILATERAL  Result Date: 06/12/2019 CLINICAL DATA:  Screening. EXAM: DIGITAL SCREENING BILATERAL MAMMOGRAM WITH TOMO AND CAD COMPARISON:  Previous exam(s). ACR Breast Density Category a: The breast tissue is almost entirely fatty. FINDINGS: There are no findings suspicious for malignancy. Images were processed with CAD. IMPRESSION: No mammographic evidence of malignancy. A result letter of this screening mammogram will be mailed directly to the patient. RECOMMENDATION: Screening mammogram in one year. (Code:SM-B-01Y) BI-RADS CATEGORY  1: Negative. Electronically Signed   By: Nolon Nations M.D.   On: 06/12/2019 16:42

## 2019-07-09 NOTE — Progress Notes (Signed)
CXR ordered and obtained

## 2019-07-10 LAB — COMPREHENSIVE METABOLIC PANEL
ALT: 22 U/L (ref 0–44)
AST: 28 U/L (ref 15–41)
Albumin: 2.4 g/dL — ABNORMAL LOW (ref 3.5–5.0)
Alkaline Phosphatase: 126 U/L (ref 38–126)
Anion gap: 9 (ref 5–15)
BUN: 62 mg/dL — ABNORMAL HIGH (ref 8–23)
CO2: 21 mmol/L — ABNORMAL LOW (ref 22–32)
Calcium: 8.8 mg/dL — ABNORMAL LOW (ref 8.9–10.3)
Chloride: 112 mmol/L — ABNORMAL HIGH (ref 98–111)
Creatinine, Ser: 1.43 mg/dL — ABNORMAL HIGH (ref 0.44–1.00)
GFR calc Af Amer: 45 mL/min — ABNORMAL LOW (ref >60)
GFR calc non Af Amer: 39 mL/min — ABNORMAL LOW (ref >60)
Glucose, Bld: 223 mg/dL — ABNORMAL HIGH (ref 70–99)
Potassium: 4.2 mmol/L (ref 3.5–5.1)
Sodium: 142 mmol/L (ref 135–145)
Total Bilirubin: 1.6 mg/dL — ABNORMAL HIGH (ref 0.3–1.2)
Total Protein: 6 g/dL — ABNORMAL LOW (ref 6.5–8.1)

## 2019-07-10 LAB — CBC
HCT: 44.7 % (ref 36.0–46.0)
Hemoglobin: 14.6 g/dL (ref 12.0–15.0)
MCH: 31.3 pg (ref 26.0–34.0)
MCHC: 32.7 g/dL (ref 30.0–36.0)
MCV: 95.9 fL (ref 80.0–100.0)
Platelets: 209 10*3/uL (ref 150–400)
RBC: 4.66 MIL/uL (ref 3.87–5.11)
RDW: 14.4 % (ref 11.5–15.5)
WBC: 17.9 10*3/uL — ABNORMAL HIGH (ref 4.0–10.5)
nRBC: 0.1 % (ref 0.0–0.2)

## 2019-07-10 LAB — GLUCOSE, CAPILLARY
Glucose-Capillary: 298 mg/dL — ABNORMAL HIGH (ref 70–99)
Glucose-Capillary: 223 mg/dL — ABNORMAL HIGH (ref 70–99)
Glucose-Capillary: 43 mg/dL — CL (ref 70–99)
Glucose-Capillary: 55 mg/dL — ABNORMAL LOW (ref 70–99)
Glucose-Capillary: 57 mg/dL — ABNORMAL LOW (ref 70–99)
Glucose-Capillary: 60 mg/dL — ABNORMAL LOW (ref 70–99)
Glucose-Capillary: 105 mg/dL — ABNORMAL HIGH (ref 70–99)
Glucose-Capillary: 203 mg/dL — ABNORMAL HIGH (ref 70–99)

## 2019-07-10 LAB — C-REACTIVE PROTEIN: CRP: 1.4 mg/dL — ABNORMAL HIGH (ref <1.0)

## 2019-07-10 LAB — FERRITIN: Ferritin: 1184 ng/mL — ABNORMAL HIGH (ref 11–307)

## 2019-07-10 LAB — D-DIMER, QUANTITATIVE: D-Dimer, Quant: 2.91 ug/mL-FEU — ABNORMAL HIGH (ref 0.00–0.50)

## 2019-07-10 MED ORDER — SODIUM CHLORIDE 0.9 % IV SOLN
1.5000 g | Freq: Three times a day (TID) | INTRAVENOUS | Status: DC
  Administered 2019-07-10 – 2019-07-15 (×15): 1.5 g via INTRAVENOUS
  Filled 2019-07-10 (×3): qty 1.5
  Filled 2019-07-10 (×2): qty 4
  Filled 2019-07-10: qty 1.5
  Filled 2019-07-10 (×2): qty 4
  Filled 2019-07-10: qty 1.5
  Filled 2019-07-10 (×3): qty 4
  Filled 2019-07-10 (×2): qty 1.5
  Filled 2019-07-10 (×2): qty 4

## 2019-07-10 NOTE — Plan of Care (Signed)
Patient on 4L [place]. Up in chair since 0900, tolerating well.  Problem: Education: Goal: Knowledge of General Education information will improve Description: Including pain rating scale, medication(s)/side effects and non-pharmacologic comfort measures Outcome: Progressing   Problem: Health Behavior/Discharge Planning: Goal: Ability to manage health-related needs will improve Outcome: Progressing   Problem: Clinical Measurements: Goal: Ability to maintain clinical measurements within normal limits will improve Outcome: Progressing Goal: Will remain free from infection Outcome: Progressing Goal: Diagnostic test results will improve Outcome: Progressing Goal: Respiratory complications will improve Outcome: Progressing Goal: Cardiovascular complication will be avoided Outcome: Progressing   Problem: Activity: Goal: Risk for activity intolerance will decrease Outcome: Progressing   Problem: Nutrition: Goal: Adequate nutrition will be maintained Outcome: Progressing   Problem: Coping: Goal: Level of anxiety will decrease Outcome: Progressing   Problem: Elimination: Goal: Will not experience complications related to bowel motility Outcome: Progressing Goal: Will not experience complications related to urinary retention Outcome: Progressing   Problem: Pain Managment: Goal: General experience of comfort will improve Outcome: Progressing   Problem: Safety: Goal: Ability to remain free from injury will improve Outcome: Progressing   Problem: Skin Integrity: Goal: Risk for impaired skin integrity will decrease Outcome: Progressing

## 2019-07-10 NOTE — Consult Note (Signed)
Physical Medicine and Rehabilitation Consult Reason for Consult: Debility 2/2 COVID-19   Referring Physician: Dr. Oren Binet Referring Therapist: Maurie Boettcher, OT  HPI: [name] is a 64 y.o. female with PMH of amiodarone toxicity, interstitial lung disease, chronic systolic/diastolic heart failure, GERD, history of congential heart disease, DM 2, iron deficient anemia, HTN, hyperparathyroidism, PVD, renal insufficiency, persistent afib, who was diagnosed on 12/31 with COVID-19. At baseline she does not require O2. Her symptoms started on 1/3: progressive dyspnea, fever, chills, cough, body ache, and she was found to be febrile and hypoxic in the ED, initially requiring 6L [place]. CXR was significant for diffuse bilateral opacities consistent with COVID-19 pneumonia.   Currently ambulating 15 feet MinA RW, but desats to high 60s and low 70s with exertion, requiring 8-10L O2. Patient lives alone and was previously independent. Now requiring Min-Mod A for ADLs.    ROS +SOB. +poor appetite. ROS otherwise negative.  Past Medical History:  Diagnosis Date  . Amiodarone toxicity   . Anemia   . Chronic bronchitis   . Chronic systolic CHF (congestive heart failure) ([place])    a. 04/2017 Echo: EF 25%.  . Congenital heart disease   . Diabetes mellitus without complication ([place])    Type II  . Diverticulosis   . Fall due to stumbling October 08, 2013   Due to shoes  . GERD (gastroesophageal reflux disease)   . Gout   . Hemorrhoids   . Hyperlipidemia   . Hyperparathyroidism   . Hypertension   . Hypothyroidism   . IDA (iron deficiency anemia)   . Microscopic colitis 9/11   Colonoscopy  . NICM (nonischemic cardiomyopathy) - tachycardia induced in the setting of AFib ([place])    a. 04/2017 Echo: EF 25%, basal-mid inferolateral, inf AK, triv AI, mild MR, sev dil LA, mod red RV fxn, mildly dil RA, mild to mod TR, PASP 34mmHg.  . Non-obstructive CAD (coronary artery disease)    a. 04/2017  Cath: LM 20p, LCX nl, RCA 20p/m.  Marland Kitchen Paroxysmal SVT (supraventricular tachycardia) ([place])   . Persistent atrial fibrillation ([place]) 06/2013   a. 04/2017 Admit with CHF/NICM/AFib -> CHA2DS2VASc = 4-->Xarelto 15 mg daily started; b. 06/2017 s/p BiV PPM (SJM ser # [id number]) and AVN ablation.  . Presence of permanent cardiac pacemaker 06/29/2017   Biventricular pacemaker placed  . PVD (peripheral vascular disease) ([place]) 09/2004   Right common femoral endarterectomy in April of 2007  . Renal insufficiency   . Schatzki's ring    Last EGD with esophageal dilatation 55F  9/11  . Scoliosis   . Varicose veins right leg pain and swelling   Past Surgical History:  Procedure Laterality Date  . ASD REPAIR  Age 5   [facility]  . AV NODE ABLATION N/A 06/29/2017   Procedure: AV NODE ABLATION;  Surgeon: Evans Lance, MD;  Location: [place] [facility];  Service: Cardiovascular;  Laterality: N/A;  . BIOPSY  02/13/2017   Procedure: BIOPSY;  Surgeon: Daneil Dolin, MD;  [facility];  Service: Endoscopy;;  duodenum gastric  . BIV PACEMAKER INSERTION CRT-P N/A 06/29/2017   Procedure: BIV PACEMAKER INSERTION CRT-P;  Surgeon: Evans Lance, MD;  Location: [place] [facility];  Service: Cardiovascular;  Laterality: N/A;  . CARDIOVERSION N/A 11/13/2013   Procedure: CARDIOVERSION;  Surgeon: Herminio Commons, MD;  Location: [facility];  Service: Endoscopy;  Laterality: N/A;  . COLONOSCOPY  03/04/2010  anal canal hemorrhoids otherwise normal. TI normal. Bx showed lymphocytic colitis. next TCS 02/2020  . COLONOSCOPY  April 2008   Rehman: Pancolonic diverticulosis, external hemorrhoids  . COLONOSCOPY N/A 11/30/2015   RMR: Normal terminal ileum for 10 cm, nonbleeding grade 1 internal hemorrhoids, colonic diverticulosis. Next colonoscopy in 2027.  Marland Kitchen COLONOSCOPY WITH PROPOFOL N/A 02/24/2019   Procedure: COLONOSCOPY WITH PROPOFOL;  Surgeon: Daneil Dolin, MD;  [facility];  Service: Endoscopy;  Laterality: N/A;  11:15am  . ELAS  06-01-11   Right saphenous ELAS   . ESOPHAGEAL DILATION N/A 05/28/2015   Procedure: ESOPHAGEAL DILATION;  Surgeon: Daneil Dolin, MD;  [facility];  Service: Endoscopy;  Laterality: N/A;  . ESOPHAGOGASTRODUODENOSCOPY  03/04/2010   noncritical appearing Schatzki ring. SB bx negative  . ESOPHAGOGASTRODUODENOSCOPY  05/2008   erosive reflux esophagatitis, noncritical Schatzki ring  . ESOPHAGOGASTRODUODENOSCOPY N/A 05/28/2015   [id number] esophagus somewhat baggy, likely due to underlying esophageal motility disorder/small HH   . ESOPHAGOGASTRODUODENOSCOPY N/A 02/13/2017   Dr. Gala Romney: subtly abnormal stomach of doubtful clinical significant s/p biopsy, normal duodenum s/p biopsy, fundic gland polyp on path, normal duodenal biopsy  . ESOPHAGOGASTRODUODENOSCOPY (EGD) WITH PROPOFOL N/A 02/24/2019   Procedure: ESOPHAGOGASTRODUODENOSCOPY (EGD) WITH PROPOFOL;  Surgeon: Daneil Dolin, MD;  [facility];  Service: Endoscopy;  Laterality: N/A;  . [place] SURGERY  2010  . Left hemithyroidectomy    . Left parathyroidectomy  2007  . MALONEY DILATION N/A 02/24/2019   Procedure: Venia Minks DILATION;  Surgeon: Daneil Dolin, MD;  [facility];  Service: Endoscopy;  Laterality: N/A;  . Parathyroid adenoma  2007  . Right common femoral endarterectomy  2007  . RIGHT/LEFT HEART CATH AND CORONARY ANGIOGRAPHY N/A 05/15/2017   Procedure: RIGHT/LEFT HEART CATH AND CORONARY ANGIOGRAPHY;  Surgeon: Troy Sine, MD;  Location: [place] [facility];  Service: Cardiovascular;  Laterality: N/A;  . Small bowel capsule  12 2009   Mid to distal small bowel with edema, erosions, tiny ulceration felt to be NSAID related  . TONSILLECTOMY     Family History  Problem Relation Age of Onset  . Stroke Mother   . Heart disease Mother        CHF  - Amputation-Right Leg  . Hyperlipidemia Mother   . Hypertension Mother   . Congestive  Heart Failure Mother   . Lung cancer Father   . Cancer Father        Lung  . Other Brother        back problems  . Heart attack Paternal Grandmother   . Cancer Maternal Grandmother        uterine  . Colon cancer Neg Hx   . Colon polyps Neg Hx    Social History:  reports that she has never smoked. She has never used smokeless tobacco. She reports that she does not drink alcohol or use drugs. Allergies:  Allergies  Allergen Reactions  . Flecainide Nausea Only and Other (See Comments)    Faint feeling  . Hydrocodone-Acetaminophen Nausea Only and Other (See Comments)    Severe headache  . Ibuprofen Other (See Comments)    Kidney dysfunction  . Oxycodone Hcl Nausea Only and Other (See Comments)    Headache  . Penicillins Nausea Only and Other (See Comments)    Severe headache Has patient had a PCN reaction causing immediate rash, facial/tongue/throat swelling, SOB or lightheadedness with hypotension: No Has patient had a PCN reaction causing  severe rash involving mucus membranes or skin necrosis: No Has patient had a PCN reaction that required hospitalization: No Has patient had a PCN reaction occurring within the last 10 years: No If all of the above answers are "NO", then may proceed with Cephalosporin use.    Medications Prior to Admission  Medication Sig Dispense Refill  . acetaminophen (TYLENOL) 500 MG tablet Take 1,000 mg by mouth every 6 (six) hours as needed (for pain.).     Marland Kitchen BREO ELLIPTA 100-25 MCG/INH AEPB Inhale 2 puffs into the lungs daily.    . cyclobenzaprine (FLEXERIL) 5 MG tablet Take 5 mg by mouth 3 (three) times daily as needed for muscle spasms.     . furosemide (LASIX) 40 MG tablet Take 1 tablet (40 mg total) by mouth daily. May take extra 1 daily as needed (Patient taking differently: Take 40 mg by mouth daily. ) 30 tablet 0  . gabapentin (NEURONTIN) 100 MG capsule Take 100 mg by mouth 2 (two) times daily.     . hydrALAZINE (APRESOLINE) 25 MG tablet Take 0.5  tablets (12.5 mg total) by mouth every 8 (eight) hours. 45 tablet 6  . isosorbide mononitrate (IMDUR) 30 MG 24 hr tablet Take 0.5 tablets (15 mg total) by mouth daily. 30 tablet 3  . levothyroxine (SYNTHROID) 100 MCG tablet Take 100 mcg by mouth daily before breakfast.     . loperamide (IMODIUM) 2 MG capsule Take 2-4 mg by mouth 4 (four) times daily as needed for diarrhea or loose stools.    . metoprolol succinate (TOPROL-XL) 50 MG 24 hr tablet TAKE 1 TABLET BY MOUTH ONCE DAILY. (Patient taking differently: Take 50 mg by mouth daily. ) 30 tablet 10  . pantoprazole (PROTONIX) 40 MG tablet Take 40 mg by mouth 2 (two) times daily.     . polyethylene glycol (MIRALAX / GLYCOLAX) packet Take 17 g by mouth daily as needed (for constipation.).     Marland Kitchen potassium chloride (K-DUR) 10 MEQ tablet Take 1 tablet (10 mEq total) by mouth every other day. (Patient taking differently: Take 10 mEq by mouth daily. )    . pravastatin (PRAVACHOL) 10 MG tablet Take 10 mg by mouth daily.    Marland Kitchen PROAIR HFA 108 (90 Base) MCG/ACT inhaler INHALE 2 PUFFS EVERY 6 HOURS AS NEEDED FOR SHORTNESS OF BREATH AND WHEEZING. (Patient taking differently: Inhale 2 puffs into the lungs every 6 (six) hours as needed for wheezing or shortness of breath. ) 8.5 g 2  . Probiotic Product (ALIGN PO) Take 1 tablet by mouth daily.     . sitaGLIPtin (JANUVIA) 50 MG tablet Take 50 mg by mouth every morning.     . temazepam (RESTORIL) 15 MG capsule Take 15 mg by mouth at bedtime as needed for sleep.     Marland Kitchen tiZANidine (ZANAFLEX) 2 MG tablet Take 2 mg by mouth every 8 (eight) hours as needed.     . traMADol (ULTRAM) 50 MG tablet Take 50 mg by mouth every 6 (six) hours as needed for moderate pain or severe pain.     Marland Kitchen XARELTO 15 MG TABS tablet TAKE 1 TABLET BY MOUTH ONCE A DAY. (Patient taking differently: Take 15 mg by mouth at bedtime. ) 30 tablet 11    Home: Home Living Family/patient expects to be discharged to:: Private residence Living Arrangements:  Alone(lives in duplex with brother next door) Available Help at Discharge: Family Type of Home: Other(Comment)(Duplex) Home Access: Stairs to enter [employer] of Steps:  5 Entrance Stairs-Rails: Left Home Layout: One level Bathroom Shower/Tub: Chiropodist: Standard Bathroom Accessibility: Yes Home Equipment: Grab bars - toilet, Grab bars - tub/shower  Functional History: Prior Function Level of Independence: Independent, Independent with assistive device(s) Comments: Pt reports ambulating around independently with a cane. Pt independent with ADL, laundry, cooking, and cleaning. Pt reports that brother assists with grocery shopping. Pt still drives. Pt reports 0 falls in the last 6 months. Pt does not use oxygen at home. Functional Status:  Mobility: Bed Mobility Overal bed mobility: Needs Assistance Bed Mobility: Supine to Sit Supine to sit: HOB elevated, Min guard Sit to supine: Min assist General bed mobility comments: OOB in chair Transfers Overall transfer level: Needs assistance Equipment used: Rolling walker (2 wheeled) Transfers: Sit to/from Stand Sit to Stand: Min assist Stand pivot transfers: Min assist General transfer comment: Assist to bring hips up and for balance. Chair to bsc to chair Ambulation/Gait Ambulation/Gait assistance: Min assist Gait Distance (Feet): 3 Feet Assistive device: Rolling walker (2 wheeled) Gait Pattern/deviations: Step-to pattern, Decreased step length - right, Decreased step length - left, Shuffle General Gait Details: Assist for balance and support. Amb bed to chair.  Gait velocity: decr Gait velocity interpretation: <1.31 ft/sec, indicative of household ambulator    ADL: ADL Overall ADL's : Needs assistance/impaired Eating/Feeding: Set up, Sitting Grooming: Set up, Sitting Upper Body Bathing: Supervision/ safety, Set up, Sitting Lower Body Bathing: Moderate assistance, Sit to/from stand Upper Body  Dressing : Set up, Supervision/safety, Sitting Lower Body Dressing: Moderate assistance, Sit to/from stand Toilet Transfer: Minimal assistance, Ambulation, BSC Toilet Transfer Details (indicate cue type and reason): 15 ft Toileting- Clothing Manipulation and Hygiene: Moderate assistance, Sit to/from stand Functional mobility during ADLs: Minimal assistance, Rolling walker, Cueing for safety General ADL Comments: unsafe use of RW - running RW into objects; letting go with 1 hand  Cognition: Cognition Overall Cognitive Status: Impaired/Different from baseline Orientation Level: Oriented X4 Cognition Arousal/Alertness: Awake/alert Behavior During Therapy: Flat affect Overall Cognitive Status: Impaired/Different from baseline Area of Impairment: Attention, Following commands, Safety/judgement, Awareness, Problem solving Current Attention Level: Selective Following Commands: Follows one step commands with increased time Safety/Judgement: Decreased awareness of safety, Decreased awareness of deficits Awareness: Emergent Problem Solving: Slow processing, Decreased initiation General Comments: required multiple repetitions of instructions; ? HOH  Blood pressure 136/84, pulse 75, temperature (!) 97.3 F (36.3 C), temperature source Oral, resp. rate (!) 24, height 4\' 10"  (1.473 m), weight 59.9 kg, SpO2 95 %. Physical Exam  Gen: no distress, normal appearing HEENT: oral mucosa pink and moist, NCAT. +Thrush Cardio: Reg rate Chest: normal effort, normal rate of breathing Abd: soft, non-distended Ext: no edema Skin: intact Neuro: AOx1 (unable to state location or date, though states month is January). Musculoskeletal: 4/5 strength throughout.  Psych: pleasant, normal affect  Results for orders placed or performed during the hospital encounter of 07/20/2019 (from the past 24 hour(s))  Glucose, capillary     Status: Abnormal   Collection Time: 07/09/19  4:49 PM  Result Value Ref Range    Glucose-Capillary 137 (H) 70 - 99 mg/dL  Basic metabolic panel     Status: Abnormal   Collection Time: 07/09/19  5:00 PM  Result Value Ref Range   Sodium 149 (H) 135 - 145 mmol/L   Potassium 4.1 3.5 - 5.1 mmol/L   Chloride 113 (H) 98 - 111 mmol/L   CO2 22 22 - 32 mmol/L   Glucose, Bld 151 (H) 70 -  99 mg/dL   BUN 78 (H) 8 - 23 mg/dL   Creatinine, Ser 1.72 (H) 0.44 - 1.00 mg/dL   Calcium 9.2 8.9 - 10.3 mg/dL   GFR calc non Af Amer 31 (L) >60 mL/min   GFR calc Af Amer 36 (L) >60 mL/min   Anion gap 14 5 - 15  Glucose, capillary     Status: Abnormal   Collection Time: 07/09/19  8:16 PM  Result Value Ref Range   Glucose-Capillary 127 (H) 70 - 99 mg/dL  CRP     Status: Abnormal   Collection Time: 07/10/19  3:58 AM  Result Value Ref Range   CRP 1.4 (H) <1.0 mg/dL  Comprehensive metabolic panel     Status: Abnormal   Collection Time: 07/10/19  3:58 AM  Result Value Ref Range   Sodium 142 135 - 145 mmol/L   Potassium 4.2 3.5 - 5.1 mmol/L   Chloride 112 (H) 98 - 111 mmol/L   CO2 21 (L) 22 - 32 mmol/L   Glucose, Bld 223 (H) 70 - 99 mg/dL   BUN 62 (H) 8 - 23 mg/dL   Creatinine, Ser 1.43 (H) 0.44 - 1.00 mg/dL   Calcium 8.8 (L) 8.9 - 10.3 mg/dL   Total Protein 6.0 (L) 6.5 - 8.1 g/dL   Albumin 2.4 (L) 3.5 - 5.0 g/dL   AST 28 15 - 41 U/L   ALT 22 0 - 44 U/L   Alkaline Phosphatase 126 38 - 126 U/L   Total Bilirubin 1.6 (H) 0.3 - 1.2 mg/dL   GFR calc non Af Amer 39 (L) >60 mL/min   GFR calc Af Amer 45 (L) >60 mL/min   Anion gap 9 5 - 15  CBC     Status: Abnormal   Collection Time: 07/10/19  3:58 AM  Result Value Ref Range   WBC 17.9 (H) 4.0 - 10.5 K/uL   RBC 4.66 3.87 - 5.11 MIL/uL   Hemoglobin 14.6 12.0 - 15.0 g/dL   HCT 44.7 36.0 - 46.0 %   MCV 95.9 80.0 - 100.0 fL   MCH 31.3 26.0 - 34.0 pg   MCHC 32.7 30.0 - 36.0 g/dL   RDW 14.4 11.5 - 15.5 %   Platelets 209 150 - 400 K/uL   nRBC 0.1 0.0 - 0.2 %  D-dimer     Status: Abnormal   Collection Time: 07/10/19  3:58 AM  Result Value  Ref Range   D-Dimer, Quant 2.91 (H) 0.00 - 0.50 ug/mL-FEU  Ferritin     Status: Abnormal   Collection Time: 07/10/19  3:58 AM  Result Value Ref Range   Ferritin 1,184 (H) 11 - 307 ng/mL  Glucose, capillary     Status: Abnormal   Collection Time: 07/10/19  8:36 AM  Result Value Ref Range   Glucose-Capillary 298 (H) 70 - 99 mg/dL  Glucose, capillary     Status: Abnormal   Collection Time: 07/10/19 12:04 PM  Result Value Ref Range   Glucose-Capillary 223 (H) 70 - 99 mg/dL   DG CHEST PORT 1 VIEW  Result Date: 07/09/2019 CLINICAL DATA:  Shortness of breath EXAM: PORTABLE CHEST 1 VIEW COMPARISON:  07/06/2019 FINDINGS: Patchy bilateral airspace disease again noted, worsening since prior study, particularly in the left lower lobe. Cardiomegaly. Pacer remains in place, unchanged. No effusions or pneumothorax. No acute bony abnormality. IMPRESSION: Patchy bilateral airspace opacities, worsening since prior study, particularly in the left lower lobe. Electronically Signed   By: Rolm Baptise M.D.  On: 07/09/2019 21:47    Assessment/Plan: Diagnosis: Debility 2/2 COVID-19 pneumonia.  1. Does the need for close, 24 hr/day medical supervision in concert with the patient's rehab needs make it unreasonable for this patient to be served in a less intensive setting? Yes 2. Co-Morbidities requiring supervision/potential complications: amiodarone toxicity, interstitial lung disease, chronic systolic/diastolic heart failure, GERD, history of congential heart disease, DM 2, iron deficient anemia, HTN, hyperparathyroidism, PVD, renal insufficiency, persistent afib, 3. Due to bladder management, bowel management, safety, skin/wound care, disease management, medication administration, pain management and patient education, does the patient require 24 hr/day rehab nursing? Yes 4. Does the patient require coordinated care of a physician, rehab nurse, therapy disciplines of PT, OT to address physical and functional  deficits in the context of the above medical diagnosis(es)? Yes Addressing deficits in the following areas: balance, endurance, locomotion, strength, transferring, bowel/bladder control, bathing, dressing, feeding, grooming, toileting and psychosocial support 5. Can the patient actively participate in an intensive therapy program of at least 3 hrs of therapy per day at least 5 days per week? Yes 6. The potential for patient to make measurable gains while on inpatient rehab is good 7. Anticipated functional outcomes upon discharge from inpatient rehab are modified independent  with PT, modified independent with OT, independent with SLP. 8. Estimated rehab length of stay to reach the above functional goals is: 2-3 weeks 9. Anticipated discharge destination: Home 10. Overall Rehab/Functional Prognosis: good  RECOMMENDATIONS: This patient's condition is appropriate for continued rehabilitative care in the following setting: CIR Patient has agreed to participate in recommended program. Yes Note that insurance prior authorization may be required for reimbursement for recommended care.  Comment: Mrs. Tugman would be a good inpatient rehabilitation candidate once medically ready. Last COVID-19+ result was 12/31. Would need two negative tests prior to admission. Currently on 5L HFNC and IV Decadron. She lives in a duplex apartment with her brother, who she says will be able to care for her upon discharge home. -Consider Megace to stimulate appetite. -Has thick white patches in mouth concerning for thrush, may benefit from Nystatin swish and swallow.  Thank you for this consult. I will continue to follow in Mrs. Hahne's care.   Izora Ribas, MD 07/10/2019

## 2019-07-10 NOTE — Progress Notes (Addendum)
Patient with D5NS KVO at 32ml/hr.  1728 BG 43; orange juice given 1747 BG 57; patient eating ice cream from dinner tray. 1807 BG 55; orange juice given 1826 BG 60; orange juice given 1901 BR 105  Patient asymptomatic, no complaints or concerns at this time. Discussed with oncoming night shift RN.

## 2019-07-10 NOTE — Progress Notes (Signed)
PROGRESS NOTE                                                                                                                                                                                                             Patient Demographics:    [name], is a 64 y.o. female, DOB - 1955/11/06, [id number]  Outpatient Primary MD for the patient is Celene Squibb, MD   Admit date - 07/01/2019   LOS - 8  Chief Complaint  Patient presents with   Shortness of Breath   covid pos       Brief Narrative: Patient is a 64 y.o. 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  Post admission-she had significant worsening of her hypoxemia-requiring 100% NRB-she was then transferred to [facility]  See below for further details.   Subjective:   Became more hypoxic last night-currently on 4-5 L.  Chest x-ray now shows a left lower pneumonia which is new.  Very poor intake per nursing staff.   Assessment  & Plan :   Acute Hypoxic Resp Failure due to Covid 19 Viral pneumonia and concurrent bacterial pneumonia: Remains stable over the past few days-oxygen requirements fluctuate but currently on 4-5 L.  Chest x-ray from last night shows a new left-sided infiltrate-I suspect she may have aspirated as she has been slow and lethargic for the past few days.  We will go ahead and start her on Unasyn for 5-7 days.  She had just completed a course of cefepime on 1/12, and has completed a course of remdesivir on 1/10.  Once she is a bit more  stable-she will require SNF on discharge.     Fever: afebrile  O2 requirements:  SpO2: 94 % O2 Flow Rate (L/min): 4 L/min   COVID-19 Labs: Recent Labs    07/08/19 0335 07/09/19 0513 07/10/19 0358  DDIMER 3.29* 3.57* 2.91*  FERRITIN 836* 970* 1,184*  CRP 1.7* 1.3* 1.4*       Component Value Date/Time   BNP 1,321.0 (  H) 07/16/2019 1455    Recent Labs  Lab 07/07/19 1019 07/08/19 0335  PROCALCITON 0.61 0.28    Lab Results  Component Value Date   SARSCOV2NAA Detected (A) 06/26/2019   [place] NEGATIVE 02/20/2019   [place] Not Detected 12/18/2018     COVID-19 Medications: Steroids: 1/6>> Remdesivir: 1/6>>1/10 Convalescent Plasma: 1/7   Other medications: Diuretics:Euvolemic-hold Lasix Antibiotics: Unasyn: 1/14>> Cefepime: 1/6>>1/12 Vancomycin: 1/6>>1/8  Prone/Incentive Spirometry: encouraged  incentive spirometry use 3-4/hour.  DVT Prophylaxis  : Xarelto  AKI on CKD stage IIIa: AKI likely hemodynamically mediated-creatinine improving.  Continue supportive care.    Hypernatremia: Resolved with gentle hydration-follow.  Hyperkalemia: Resolved with D50/insulin-continue Lokelma for a few more days.   Acute metabolic encephalopathy: Does not appear to be confused-she is hard of hearing-and is following commands.  Suspect mild metabolic encephalopathy was from hypoxemia, hyponatremia and AKI.    HTN: BP controlled-continue metoprolol  Persistent atrial fibrillation s/p AV node ablation and PPM insertion: Continue metoprolol-on Xarelto.  Rate under good control.  Paced rhythm on telemetry.  Note has history of amiodarone induced lung toxicity  Chronic combined systolic and diastolic heart failure: Euvolemic-hold Lasix.  Bronchial asthma: Appears stable-no rhonchi-continue bronchodilators.  DM-2 A1c 6.8): CBGs stable-continue SSI-Lantus remains on hold.    CBG (last 3)  Recent Labs    07/09/19 1649 07/09/19 2016 07/10/19 0836  GLUCAP 137* 127*  298*   Hypothyroidism: Continue levothyroxine  History of amiodarone induced lung toxicity/ILD: Supportive care.  Remote history of ASD repair  Deconditioning/debility: Appears very weak from acute illness-plans are for SNF when she is closer to discharge.  Consults  :  PCCM  Procedures  :  None  ABG:    Component Value Date/Time   PHART 7.427 05/15/2017 1158   PCO2ART 37.7 05/15/2017 1158   PO2ART 61.0 (L) 05/15/2017 1158   HCO3 24.9 05/15/2017 1158   TCO2 26 05/15/2017 1158   O2SAT 92.0 05/15/2017 1158    Vent Settings: N/A  Condition -stable  Family Communication  :  Brother-updated over the phone on 1/14  Code Status :  Full Code  Diet :  Diet Order            Diet heart healthy/carb modified Room service appropriate? Yes; Fluid consistency: Thin  Diet effective now               Disposition Plan  :  Remain hospitalized-SNF when she is stable for discharge.  Barriers to discharge: Hypoxia requiring O2 supplementation Antimicorbials  :    Anti-infectives (From admission, onward)   Start     Dose/Rate Route Frequency Ordered Stop   07/10/19 1000  ampicillin-sulbactam (UNASYN) 1.5 g in sodium chloride 0.9 % 100 mL IVPB     1.5 g 200 mL/hr over 30 Minutes Intravenous Every 8 hours 07/10/19 0945     07/04/19 1830  vancomycin (VANCOREADY) IVPB 750 mg/150 mL  Status:  Discontinued     750 mg 150 mL/hr over 60 Minutes Intravenous Every 48 hours 07/03/19 1249 07/04/19 1110   07/04/19 1800  vancomycin (VANCOCIN) IVPB 1000 mg/200 mL premix  Status:  Discontinued     1,000 mg 200 mL/hr over 60 Minutes Intravenous Every 48 hours 06/29/2019 1727 07/03/19 1249   07/03/19 1800  ceFEPIme (MAXIPIME) 1 g in sodium chloride 0.9 % 100 mL IVPB  Status:  Discontinued     1 g 200 mL/hr over 30 Minutes Intravenous Every 24 hours 07/07/2019 1725 07/03/19 1248   07/03/19 1800  ceFEPIme (MAXIPIME) 2 g in sodium chloride 0.9 % 100 mL IVPB     2 g 200 mL/hr over 30 Minutes  Intravenous Every 24 hours 07/03/19 1247 07/08/19 1850   07/03/19 1000  remdesivir 100 mg in sodium chloride 0.9 % 100 mL IVPB     100 mg 200 mL/hr over 30 Minutes Intravenous Daily 07/16/2019 1723 07/06/19 0926   07/03/19 1000  remdesivir 100 mg in sodium chloride 0.9 % 100 mL IVPB  Status:  Discontinued     100 mg 200 mL/hr over 30 Minutes Intravenous Daily 07/08/2019 2355 07/03/19 0257   07/26/2019 2354  remdesivir 200 mg in sodium chloride 0.9% 250 mL IVPB  Status:  Discontinued     200 mg 580 mL/hr over 30 Minutes Intravenous Once 07/22/2019 2355 07/03/19 0257   07/18/2019 1800  remdesivir 200 mg in sodium chloride 0.9% 250 mL IVPB     200 mg 580 mL/hr over 30 Minutes Intravenous Once 07/05/2019 1723 07/20/2019 2306   07/14/2019 1730  vancomycin (VANCOCIN) IVPB 1000 mg/200 mL premix  Status:  Discontinued     1,000 mg 200 mL/hr over 60 Minutes Intravenous  Once 07/18/2019 1722 07/08/2019 1725   07/01/2019 1730  ceFEPIme (MAXIPIME) 2 g in sodium chloride 0.9 % 100 mL IVPB     2 g 200 mL/hr over 30 Minutes Intravenous  Once 07/05/2019 1722 07/12/2019 1918   06/28/2019 1730  vancomycin (VANCOREADY) IVPB 1500 mg/300 mL     1,500 mg 150 mL/hr over 120 Minutes Intravenous  Once 07/10/2019 1725 07/01/2019 2036      Inpatient Medications  Scheduled Meds:  sodium chloride   Intravenous Once   vitamin C  500 mg Oral Daily   dexamethasone (DECADRON) injection  6 mg Intravenous Q24H   fluticasone furoate-vilanterol  2 puff Inhalation Daily   insulin aspart  0-20 Units Subcutaneous TID WC   insulin aspart  4 Units Subcutaneous TID WC   levothyroxine  100 mcg Oral QAC breakfast   metoprolol succinate  50 mg Oral Daily   pantoprazole  40 mg Oral BID   pravastatin  10 mg Oral Daily   Rivaroxaban  15 mg Oral Q breakfast   sodium zirconium cyclosilicate  10 g Oral Daily   zinc sulfate  220 mg Oral Daily   Continuous Infusions:  ampicillin-sulbactam (UNASYN) IV     PRN Meds:.acetaminophen, albuterol,  chlorpheniramine-HYDROcodone, guaiFENesin-dextromethorphan, lip balm, senna-docusate   Time Spent in minutes 25  See all Orders from today for further details   Oren Binet M.D on 07/10/2019 at 10:26 AM  To page go to [email or web address] - use universal password  [facility] -  Office  [phone]    Objective:   Vitals:   07/09/19 2347 07/09/19 2352 07/10/19 0519 07/10/19 0800  BP: (!) 144/75  140/82 (!) 143/79  Pulse: 77 72 72 73  Resp: (!) 26 (!) 24 (!) 23 20  Temp: 98.4 F (36.9 C)  97.8 F (36.6 C) 98.2 F (36.8 C)  TempSrc: Axillary  Axillary Oral  SpO2: 96% 93% 97% 94%  Weight:      Height:        Wt Readings from Last 3 Encounters:  07/17/2019 59.9 kg  04/01/19 65.8 kg  04/01/19 64.4 kg     Intake/Output Summary (Last 24 hours) at 07/10/2019 1026 Last data filed at 07/10/2019 0800 Gross per 24 hour  Intake 2316.9 ml  Output 552 ml  Net 1764.9 ml     Physical Exam  Gen Exam:Alert awake-but looks very weak and deconditioned.  Speech is slow but clear. HEENT:atraumatic, normocephalic Chest: Bibasilar rales. CVS:S1S2 regular Abdomen:soft non tender, non distended Extremities:no edema Neurology: Non focal Skin: no rash   Data Review:    CBC Recent Labs  Lab 07/04/19 0455 07/04/19 0455 07/05/19 0530 07/05/19 0530 07/06/19 0330 07/07/19 0352 07/08/19 0335 07/09/19 0513 07/10/19 0358  WBC 8.2   < > 13.7*   < > 15.1* 16.8* 17.5* 17.0* 17.9*  HGB 14.6   < > 15.3*   < > 15.5* 16.1* 15.6* 14.9 14.6  HCT 45.5   < > 47.6*   < > 47.3* 49.0* 47.9* 45.5 44.7  PLT 288   < > 335   < > 291 255 254 250 209  MCV 96.0   < > 95.2   < > 93.7 95.1 94.9 94.6 95.9  MCH 30.8   < > 30.6   < > 30.7 31.3 30.9 31.0 31.3  MCHC 32.1   < > 32.1   < > 32.8 32.9 32.6 32.7 32.7  RDW 13.6   < > 13.7   < > 13.6 13.9 14.2 14.2 14.4  LYMPHSABS 0.6*  --  0.5*  --  0.6* 0.7  --   --   --   MONOABS 0.4  --  0.3  --  0.4 0.6  --   --   --   EOSABS 0.0  --  0.0  --  0.0 0.0   --   --   --   BASOSABS 0.0  --  0.0  --  0.1 0.1  --   --   --    < > = values in this interval not displayed.    Chemistries  Recent Labs  Lab 07/06/19 0330 07/06/19 0330 07/07/19 0352 07/08/19 0335 07/09/19 0513 07/09/19 1700 07/10/19 0358  NA 141   < > 149* 150* 147* 149* 142  K 4.3   < > 3.9 4.3 5.7* 4.1 4.2  CL 111   < > 109 114* 118* 113* 112*  CO2 20*   < > 23 22 18* 22 21*  GLUCOSE 108*   < > 66* 80 177* 151* 223*  BUN 72*   < > 80* 85* 84* 78* 62*  CREATININE 1.52*   < > 1.75* 1.76* 1.76* 1.72* 1.43*  CALCIUM 9.2   < > 9.1 9.2 8.8* 9.2 8.8*  AST 33  --  28 24 33  --  28  ALT 24  --  23 23 23   --  22  ALKPHOS 110  --  115 117 148*  --  126  BILITOT 1.0  --  1.2 1.3* 1.6*  --  1.6*   < > = values in this interval not displayed.   ------------------------------------------------------------------------------------------------------------------ No results for input(s): CHOL, HDL, LDLCALC, TRIG, CHOLHDL, LDLDIRECT in the last 72 hours.  Lab Results  Component Value Date   HGBA1C 6.8 (H) 07/06/2019   ------------------------------------------------------------------------------------------------------------------ No results for input(s): TSH, T4TOTAL, T3FREE, THYROIDAB in the last 72 hours.  Invalid input(s): FREET3 ------------------------------------------------------------------------------------------------------------------ Recent Labs    07/09/19 0513 07/10/19 0358  FERRITIN 970* 1,184*    Coagulation profile No results for input(s): INR, PROTIME in the last 168 hours.  Recent Labs    07/09/19 0513 07/10/19 0358  DDIMER 3.57* 2.91*    Cardiac Enzymes No results for input(s): CKMB, TROPONINI, MYOGLOBIN in the last 168 hours.  Invalid input(s): CK ------------------------------------------------------------------------------------------------------------------    Component Value  Date/Time   BNP 1,321.0 (H) 07/11/2019 1455    Micro  Results Recent Results (from the past 240 hour(s))  Blood Culture (routine x 2)     Status: None   Collection Time: 07/23/2019  2:54 PM   Specimen: Left Antecubital; Blood  Result Value Ref Range Status   Specimen Description LEFT ANTECUBITAL  Final   Special Requests   Final    BOTTLES DRAWN AEROBIC AND ANAEROBIC Blood Culture adequate volume   Culture   Final    NO GROWTH 5 DAYS Performed at [facility], [address] [place], [place] [address]    Report Status 07/07/2019 FINAL  Final  Blood Culture (routine x 2)     Status: None   Collection Time: 07/27/2019  4:34 PM   Specimen: BLOOD LEFT ARM  Result Value Ref Range Status   Specimen Description BLOOD LEFT ARM  Final   Special Requests   Final    BOTTLES DRAWN AEROBIC AND ANAEROBIC Blood Culture adequate volume   Culture   Final    NO GROWTH 5 DAYS Performed at [facility], [address] [place], [place] [address]    Report Status 07/07/2019 FINAL  Final    Radiology Reports DG CHEST PORT 1 VIEW  Result Date: 07/09/2019 CLINICAL DATA:  Shortness of breath EXAM: PORTABLE CHEST 1 VIEW COMPARISON:  07/06/2019 FINDINGS: Patchy bilateral airspace disease again noted, worsening since prior study, particularly in the left lower lobe. Cardiomegaly. Pacer remains in place, unchanged. No effusions or pneumothorax. No acute bony abnormality. IMPRESSION: Patchy bilateral airspace opacities, worsening since prior study, particularly in the left lower lobe. Electronically Signed   By: Rolm Baptise M.D.   On: 07/09/2019 21:47   DG Chest Port 1 View  Result Date: 07/03/2019 CLINICAL DATA:  COVID-19 positive.  Worsening shortness of breath. EXAM: PORTABLE CHEST 1 VIEW COMPARISON:  07/15/2019. FINDINGS: Surgical clips noted over the neck. AICD in stable position. Stable cardiomegaly. Diffuse severe bilateral pulmonary infiltrates again noted. Slight improvement in aeration on today's exam. No pleural effusion pneumothorax. Old infarct proximal  right humerus. No acute bony abnormality. IMPRESSION: 1.  AICD in stable position.  Stable cardiomegaly. 2. Diffuse severe bilateral pulmonary infiltrates again noted. Slight improvement in aeration on today's exam. Electronically Signed   By: Marcello Moores  Register   On: 07/03/2019 07:15   DG Chest Port 1 View  Result Date: 07/19/2019 CLINICAL DATA:  Hypoxia. EXAM: PORTABLE CHEST 1 VIEW COMPARISON:  None. FINDINGS: There is a dual lead AICD. Moderate severity diffuse bilateral infiltrates are seen. There is a small right pleural effusion. No pneumothorax is identified. The cardiac silhouette is markedly enlarged. Multiple radiopaque surgical clips are seen overlying the superior mediastinum. There is marked severity levoscoliosis of the thoracic spine with moderate severity multilevel degenerative changes. IMPRESSION: 1. Moderate severity diffuse bilateral infiltrates with a small right pleural effusion. 2. Marked enlargement of the cardiac silhouette. 3. Dual lead AICD in place. Electronically Signed   By: Virgina Norfolk M.D.   On: 07/15/2019 15:50   DG Chest Port 1V today  Result Date: 07/06/2019 CLINICAL DATA:  Dyspnea, COVID-19 positive EXAM: PORTABLE CHEST 1 VIEW COMPARISON:  07/03/2019 chest radiograph. FINDINGS: Stable configuration of 2 lead left subclavian pacemaker. Surgical clips overlie the central lower neck. Stable cardiomediastinal silhouette with mild cardiomegaly. No pneumothorax. No pleural effusion. Patchy hazy opacities throughout the mid to lower lungs bilaterally, similar to minimally improved. IMPRESSION: Patchy hazy opacities throughout the mid to lower lungs bilaterally,  similar to minimally improved, compatible with COVID-19 pneumonia. Stable cardiomegaly. Electronically Signed   By: Ilona Sorrel M.D.   On: 07/06/2019 13:09   MM 3D SCREEN BREAST BILATERAL  Result Date: 06/12/2019 CLINICAL DATA:  Screening. EXAM: DIGITAL SCREENING BILATERAL MAMMOGRAM WITH TOMO AND CAD COMPARISON:   Previous exam(s). ACR Breast Density Category a: The breast tissue is almost entirely fatty. FINDINGS: There are no findings suspicious for malignancy. Images were processed with CAD. IMPRESSION: No mammographic evidence of malignancy. A result letter of this screening mammogram will be mailed directly to the patient. RECOMMENDATION: Screening mammogram in one year. (Code:SM-B-01Y) BI-RADS CATEGORY  1: Negative. Electronically Signed   By: Nolon Nations M.D.   On: 06/12/2019 16:42

## 2019-07-10 NOTE — Progress Notes (Signed)
Physical Therapy Treatment Patient Details Name: [name] MRN: [id number] DOB: 10-Jul-1955 Today's Date: 07/10/2019    History of Present Illness 64 y.o. female, PMHx including amiodarone toxicity, interstitial lung disease, chronic systolic/diastolic heart failure, GERD,  history of congenital heart disease, DM II, iron deficient anemia, hypertension,hyperparathyroidism, peripheral vascular disease, renal insufficiency, persistent A. fib anticoagulated on Xarelto presented to ED w/ shortness of breath, patient was recently diagnosed 12/31 with COVID-19, at baseline has no oxygen requirement, patient reported her symptoms started on 1/3, with progressive dyspnea, fever, chills, cough, neurolyse body ache, denies nausea, vomiting, diarrhea, chest pain, leg edema, or orthopnea. in ED was febrile 103, hypoxic in the mid 80s, requiring initially 6 L nasal cannula, chest x-ray significant for diffuse bilateral opacities, pt admited for COVID-19 for pneumonia.    PT Comments    Pt making slow, steady progress with mobility. Continue to recommend SNF.    Follow Up Recommendations  SNF     Equipment Recommendations  Other (comment)(rollator)    Recommendations for Other Services       Precautions / Restrictions Precautions Precautions: Fall;Other (comment) Precaution Comments: Monitor oxygen; desats with exertion Restrictions Weight Bearing Restrictions: No    Mobility  Bed Mobility               General bed mobility comments: OOB in chair  Transfers Overall transfer level: Needs assistance Equipment used: Rolling walker (2 wheeled) Transfers: Sit to/from Stand Sit to Stand: Min assist         General transfer comment: Assist to bring hips up and for balance  Ambulation/Gait Ambulation/Gait assistance: Min assist Gait Distance (Feet): 35 Feet Assistive device: 4-wheeled walker Gait Pattern/deviations: Step-through pattern;Decreased step length - right;Decreased  step length - left;Shuffle;Trunk flexed Gait velocity: decr Gait velocity interpretation: <1.31 ft/sec, indicative of household ambulator General Gait Details: Assist for balance and support. Assist steering rollator.    Stairs             Wheelchair Mobility    Modified Rankin (Stroke Patients Only)       Balance Overall balance assessment: Needs assistance Sitting-balance support: No upper extremity supported Sitting balance-Leahy Scale: Good     Standing balance support: Bilateral upper extremity supported Standing balance-Leahy Scale: Poor Standing balance comment: rollator and min guard for static standing                            Cognition Arousal/Alertness: Awake/alert Behavior During Therapy: Flat affect Overall Cognitive Status: Impaired/Different from baseline Area of Impairment: Attention;Following commands;Safety/judgement;Awareness;Problem solving                   Current Attention Level: Selective   Following Commands: Follows one step commands with increased time Safety/Judgement: Decreased awareness of safety;Decreased awareness of deficits Awareness: Emergent Problem Solving: Slow processing;Decreased initiation General Comments: required multiple repetitions of instructions; ? HOH      Exercises Other Exercises Other Exercises: flutter valve x 10    General Comments General comments (skin integrity, edema, etc.): Pt on 4L O2 at rest with SpO2 88%. Incr O2 to 10L for activity and SpO2 mid 80's with amb. Returned O2 to 5L with pt at rest and SpO2 91%.       Pertinent Vitals/Pain Pain Assessment: No/denies pain Faces Pain Scale: No hurt    Home Living  Prior Function            PT Goals (current goals can now be found in the care plan section) Acute Rehab PT Goals Patient Stated Goal: to get better Progress towards PT goals: Progressing toward goals    Frequency    Min  2X/week      PT Plan Current plan remains appropriate    Co-evaluation              AM-PAC PT "6 Clicks" Mobility   Outcome Measure  Help needed turning from your back to your side while in a flat bed without using bedrails?: A Little Help needed moving from lying on your back to sitting on the side of a flat bed without using bedrails?: A Little Help needed moving to and from a bed to a chair (including a wheelchair)?: A Little Help needed standing up from a chair using your arms (e.g., wheelchair or bedside chair)?: A Little Help needed to walk in hospital room?: A Little Help needed climbing 3-5 steps with a railing? : Total 6 Click Score: 16    End of Session Equipment Utilized During Treatment: Gait belt;Oxygen Activity Tolerance: Patient limited by fatigue Patient left: in chair;with call bell/phone within reach;with chair alarm set Nurse Communication: Mobility status PT Visit Diagnosis: Other abnormalities of gait and mobility (R26.89);Muscle weakness (generalized) (M62.81)     Time: 1232-1300 PT Time Calculation (min) (ACUTE ONLY): 28 min  Charges:  $Gait Training: 23-37 mins                     [place] Pager [phone] Office [place] 07/10/2019, 2:58 PM

## 2019-07-10 NOTE — Progress Notes (Signed)
Occupational Therapy Treatment Patient Details Name: [name] MRN: [id number] DOB: October 05, 1955 Today's Date: 07/10/2019    History of present illness 64 y.o. female, PMHx including amiodarone toxicity, interstitial lung disease, chronic systolic/diastolic heart failure, GERD,  history of congenital heart disease, DM II, iron deficient anemia, hypertension,hyperparathyroidism, peripheral vascular disease, renal insufficiency, persistent A. fib anticoagulated on Xarelto presented to ED w/ shortness of breath, patient was recently diagnosed 12/31 with COVID-19, at baseline has no oxygen requirement, patient reported her symptoms started on 1/3, with progressive dyspnea, fever, chills, cough, neurolyse body ache, denies nausea, vomiting, diarrhea, chest pain, leg edema, or orthopnea. in ED was febrile 103, hypoxic in the mid 80s, requiring initially 6 L nasal cannula, chest x-ray significant for diffuse bilateral opacities, pt admited for COVID-19 for pneumonia.   OT comments  Pt making progress toward goals. Able to ambulate @ 15 ft to sink with Min A @ RW level. Desat into high 60s/low 70s with exertion on 8L during ADL, requiring increase O2 to 10L with increased time time for recovery. Mod VC for pursed lip breathing as pt appears to be a mouth breather. Nsg made aware of SpO2 during exertion. Left on 6L HFNC with SpO2 in low 90s. PTA, pt lived alone and was independent with ADL. If pt continues to progress and has 24/7 S she would be a good candidate for CIR. Will continue to follow acutely.   Follow Up Recommendations  SNF;CIR(pending progress)    Equipment Recommendations  Other (comment)    Recommendations for Other Services      Precautions / Restrictions Precautions Precautions: Fall;Other (comment) Precaution Comments: Monitor oxygen; desats with exertion Restrictions Weight Bearing Restrictions: No       Mobility Bed Mobility               General bed mobility  comments: OOB in chair  Transfers Overall transfer level: Needs assistance Equipment used: Rolling walker (2 wheeled) Transfers: Sit to/from Stand Sit to Stand: Min assist              Balance Overall balance assessment: Needs assistance   Sitting balance-Leahy Scale: Good       Standing balance-Leahy Scale: Poor                             ADL either performed or assessed with clinical judgement   ADL Overall ADL's : Needs assistance/impaired     Grooming: Set up;Sitting   Upper Body Bathing: Supervision/ safety;Set up;Sitting   Lower Body Bathing: Moderate assistance;Sit to/from stand   Upper Body Dressing : Set up;Supervision/safety;Sitting   Lower Body Dressing: Moderate assistance;Sit to/from stand   Toilet Transfer: Minimal assistance;Ambulation;BSC Toilet Transfer Details (indicate cue type and reason): 15 ft Toileting- Clothing Manipulation and Hygiene: Moderate assistance;Sit to/from stand       Functional mobility during ADLs: Minimal assistance;Rolling walker;Cueing for safety General ADL Comments: unsafe use of RW - running RW into objects; letting go with 1 hand     Vision       Perception     Praxis      Cognition Arousal/Alertness: Awake/alert Behavior During Therapy: Flat affect Overall Cognitive Status: Impaired/Different from baseline Area of Impairment: Attention;Following commands;Safety/judgement;Awareness;Problem solving                   Current Attention Level: Selective   Following Commands: Follows one step commands with increased time Safety/Judgement: Decreased awareness of safety;Decreased  awareness of deficits Awareness: Emergent Problem Solving: Slow processing;Decreased initiation General Comments: required multiple repetitions of instructions; ? HOH        Exercises Other Exercises Other Exercises: flutter valve x 10   Shoulder Instructions       General Comments      Pertinent  Vitals/ Pain       Pain Assessment: Faces Faces Pain Scale: No hurt  Home Living                                          Prior Functioning/Environment              Frequency  Min 3X/week        Progress Toward Goals  OT Goals(current goals can now be found in the care plan section)  Progress towards OT goals: Progressing toward goals  Acute Rehab OT Goals Patient Stated Goal: to get better Time For Goal Achievement: 07/20/19 Potential to Achieve Goals: Good ADL Goals Pt Will Perform Grooming: with supervision;standing Pt Will Perform Lower Body Bathing: with supervision;sit to/from stand Pt Will Perform Lower Body Dressing: with supervision;sit to/from stand Pt Will Transfer to Toilet: with supervision;ambulating Pt Will Perform Toileting - Clothing Manipulation and hygiene: with supervision;sit to/from stand Additional ADL Goal #1: Pt to recall and verbalize 3 fall prevention strategies with 0 verbal cues. Additional ADL Goal #2: Pt to recall and verbalize 3 energy conservation strategies with 0 verbal cues. Additional ADL Goal #3: Pt to tolerate standing up to 5 min with supervision with oxygen maintaining above 90%, in preparation for ADLs.  Plan Discharge plan remains appropriate    Co-evaluation                 AM-PAC OT "6 Clicks" Daily Activity     Outcome Measure   Help from another person eating meals?: A Little Help from another person taking care of personal grooming?: A Little Help from another person toileting, which includes using toliet, bedpan, or urinal?: A Lot Help from another person bathing (including washing, rinsing, drying)?: A Lot Help from another person to put on and taking off regular upper body clothing?: A Little Help from another person to put on and taking off regular lower body clothing?: A Lot 6 Click Score: 15    End of Session Equipment Utilized During Treatment: Gait belt;Rolling walker;Oxygen(10  L)  OT Visit Diagnosis: Unsteadiness on feet (R26.81);Muscle weakness (generalized) (M62.81)   Activity Tolerance Patient tolerated treatment well   Patient Left in chair;with call bell/phone within reach;with chair alarm set   Nurse Communication Mobility status;Other (comment)(SpO2 )        Time: [id number] OT Time Calculation (min): 45 min  Charges: OT General Charges $OT Visit: 1 Visit OT Treatments $Self Care/Home Management : 38-52 mins  Maurie Boettcher, OT/L   Acute OT Clinical Specialist [place] Pager [phone] Office [phone]    [facility] 07/10/2019, 11:55 AM

## 2019-07-11 LAB — CBC
HCT: 47.4 % — ABNORMAL HIGH (ref 36.0–46.0)
Hemoglobin: 15.1 g/dL — ABNORMAL HIGH (ref 12.0–15.0)
MCH: 31 pg (ref 26.0–34.0)
MCHC: 31.9 g/dL (ref 30.0–36.0)
MCV: 97.3 fL (ref 80.0–100.0)
Platelets: 225 10*3/uL (ref 150–400)
RBC: 4.87 MIL/uL (ref 3.87–5.11)
RDW: 14.5 % (ref 11.5–15.5)
WBC: 20.1 10*3/uL — ABNORMAL HIGH (ref 4.0–10.5)
nRBC: 0.1 % (ref 0.0–0.2)

## 2019-07-11 LAB — COMPREHENSIVE METABOLIC PANEL
ALT: 22 U/L (ref 0–44)
AST: 30 U/L (ref 15–41)
Albumin: 2.6 g/dL — ABNORMAL LOW (ref 3.5–5.0)
Alkaline Phosphatase: 141 U/L — ABNORMAL HIGH (ref 38–126)
Anion gap: 11 (ref 5–15)
BUN: 52 mg/dL — ABNORMAL HIGH (ref 8–23)
CO2: 22 mmol/L (ref 22–32)
Calcium: 9.2 mg/dL (ref 8.9–10.3)
Chloride: 111 mmol/L (ref 98–111)
Creatinine, Ser: 1.26 mg/dL — ABNORMAL HIGH (ref 0.44–1.00)
GFR calc Af Amer: 53 mL/min — ABNORMAL LOW (ref >60)
GFR calc non Af Amer: 45 mL/min — ABNORMAL LOW (ref >60)
Glucose, Bld: 141 mg/dL — ABNORMAL HIGH (ref 70–99)
Potassium: 4.4 mmol/L (ref 3.5–5.1)
Sodium: 144 mmol/L (ref 135–145)
Total Bilirubin: 1.7 mg/dL — ABNORMAL HIGH (ref 0.3–1.2)
Total Protein: 6.3 g/dL — ABNORMAL LOW (ref 6.5–8.1)

## 2019-07-11 LAB — GLUCOSE, CAPILLARY
Glucose-Capillary: 219 mg/dL — ABNORMAL HIGH (ref 70–99)
Glucose-Capillary: 316 mg/dL — ABNORMAL HIGH (ref 70–99)
Glucose-Capillary: 242 mg/dL — ABNORMAL HIGH (ref 70–99)
Glucose-Capillary: 185 mg/dL — ABNORMAL HIGH (ref 70–99)

## 2019-07-11 LAB — FERRITIN: Ferritin: 1105 ng/mL — ABNORMAL HIGH (ref 11–307)

## 2019-07-11 LAB — C-REACTIVE PROTEIN: CRP: 2 mg/dL — ABNORMAL HIGH (ref <1.0)

## 2019-07-11 LAB — D-DIMER, QUANTITATIVE: D-Dimer, Quant: 2.89 ug/mL-FEU — ABNORMAL HIGH (ref 0.00–0.50)

## 2019-07-11 MED ORDER — INSULIN ASPART 100 UNIT/ML ~~LOC~~ SOLN
0.0000 [IU] | Freq: Three times a day (TID) | SUBCUTANEOUS | Status: DC
  Administered 2019-07-11 (×2): 3 [IU] via SUBCUTANEOUS
  Administered 2019-07-11 – 2019-07-12 (×2): 7 [IU] via SUBCUTANEOUS
  Administered 2019-07-12: 3 [IU] via SUBCUTANEOUS
  Administered 2019-07-12: 11:00:00 7 [IU] via SUBCUTANEOUS
  Administered 2019-07-13: 08:00:00 2 [IU] via SUBCUTANEOUS
  Administered 2019-07-13: 3 [IU] via SUBCUTANEOUS
  Administered 2019-07-13: 16:00:00 7 [IU] via SUBCUTANEOUS
  Administered 2019-07-14: 5 [IU] via SUBCUTANEOUS
  Administered 2019-07-14: 08:00:00 2 [IU] via SUBCUTANEOUS
  Administered 2019-07-14 – 2019-07-15 (×2): 5 [IU] via SUBCUTANEOUS
  Administered 2019-07-15: 09:00:00 2 [IU] via SUBCUTANEOUS
  Administered 2019-07-15 – 2019-07-16 (×3): 9 [IU] via SUBCUTANEOUS
  Administered 2019-07-16 – 2019-07-17 (×2): 3 [IU] via SUBCUTANEOUS

## 2019-07-11 NOTE — Progress Notes (Signed)
Results for [name], [name] (MRN [id number]) as of 07/11/2019 14:04  Ref. Range 07/10/2019 18:26 07/10/2019 19:01 07/10/2019 20:43 07/11/2019 08:11 07/11/2019 12:12  Glucose-Capillary Latest Ref Range: 70 - 99 mg/dL 60 (L) 105 (H) 203 (H) 219 (H) 316 (H)  Noted that patient had a low blood sugar of 60 mg/dl. at 18:26 last night. CBGs have gradually increased to greater than 200 mg/dl.  Recommend adding Novolog 3 units TID as meal coverage if patient eats at least 50% of meal. May need to increase Novolog correction scale if blood sugars continue to be elevated.   Harvel Ricks RN BSN CDE Diabetes Coordinator Pager: [phone]  8am-5pm

## 2019-07-11 NOTE — Progress Notes (Signed)
PROGRESS NOTE                                                                                                                                                                                                             Patient Demographics:    [name], is a 64 y.o. female, DOB - 25-Nov-1955, [id number]  Outpatient Primary MD for the patient is Celene Squibb, MD   Admit date - 07/27/2019   LOS - 9  Chief Complaint  Patient presents with  . Shortness of Breath  . covid pos       Brief Narrative: Patient is a 64 y.o. female with PMHx of persistent atrial fibrillation on anticoagulation, amiodarone induced lung toxicity with associated ILD, chronic combined systolic and diastolic heart failure, DM-2, HTN, DM-2-who presented to [facility] emergency room on 1/6 with shortness of breath-she was found to have acute hypoxic respiratory failure secondary to Covid 19 pneumonia and concurrent bacterial pneumonia-she was then admitted to the hospitalist service.  Post admission-she had significant worsening of her hypoxemia-requiring 100% NRB-she was then transferred to [facility].  Hypoxia gradually improved with steroids/remdesivir and empiric antibiotics-however on 1/12-1/13-hypoxia briefly worsen-chest x-ray showed a new left-sided infiltrate.  Patient was restarted on IV antibiotics (Unasyn)-with further improvement of hypoxemia.  See below for further details.    COVID-19 Medications:  Steroids: 1/6>> Remdesivir: 1/6>>1/10 Convalescent Plasma: 1/7   Antibiotics: Unasyn: 1/14>> Cefepime: 1/6>>1/12 Vancomycin: 1/6>>1/8   Subjective:   Much more awake and alert this morning-stable on just 2-3 L of oxygen.   Assessment  & Plan :   Acute Hypoxic Resp Failure due to Covid 19 Viral pneumonia and concurrent bacterial pneumonia: Slowly improving-down to 2-3 L of oxygen-chest x-ray done on 1/13-showed a new left lung  infiltrate-due to some lethargy/confusion-aspiration pneumonia was suspected-patient was started on Unasyn on 1/14 with some improvement overnight.  Continue steroids for another day or so before stopping.  Completed remdesivir on 1/10.   Fever: afebrile  O2 requirements:  SpO2: 90 % O2 Flow Rate (L/min): 2 L/min   COVID-19 Labs: Recent Labs    07/09/19 0513 07/10/19 0358 07/11/19 0239  DDIMER 3.57* 2.91* 2.89*  FERRITIN 970* 1,184* 1,105*  CRP 1.3* 1.4* 2.0*       Component Value Date/Time   BNP 1,321.0 (H) 07/12/2019 1455  Recent Labs  Lab 07/07/19 1019 07/08/19 0335  PROCALCITON 0.61 0.28    Lab Results  Component Value Date   SARSCOV2NAA Detected (A) 06/26/2019   [place] NEGATIVE 02/20/2019   [place] Not Detected 12/18/2018     COVID-19 Medications: Steroids: 1/6>> Remdesivir: 1/6>>1/10 Convalescent Plasma: 1/7   Other medications: Diuretics:Euvolemic-hold Lasix Antibiotics: Unasyn: 1/14>> Cefepime: 1/6>>1/12 Vancomycin: 1/6>>1/8  Prone/Incentive Spirometry: encouraged  incentive spirometry use 3-4/hour.  DVT Prophylaxis  : Xarelto  AKI on CKD stage IIIa: AKI likely hemodynamically mediated-creatinine improving.  Continue supportive care.    Hypernatremia: Resolved with gentle hydration-follow.  Hyperkalemia: Resolved with D50/insulin-continue Lokelma for a few more days.   Acute metabolic encephalopathy: Does not appear to be confused-she is hard of hearing-and is following commands.  Suspect mild metabolic encephalopathy was from hypoxemia, hyponatremia and AKI.    HTN: BP controlled-continue metoprolol  Persistent atrial fibrillation s/p AV node ablation and PPM insertion: Continue metoprolol-on Xarelto.  Rate under good control.  Paced rhythm on telemetry.  Note has history of amiodarone induced lung toxicity  Chronic combined systolic and diastolic heart failure: Euvolemic-hold Lasix.  Bronchial asthma: Appears stable-no  rhonchi-continue bronchodilators.  DM-2 A1c 6.8): CBGs stable-continue SSI-Lantus remains on hold.    CBG (last 3)  Recent Labs    07/10/19 2043 07/11/19 0811 07/11/19 1212  GLUCAP 203* 219* 316*   Hypothyroidism: Continue levothyroxine  History of amiodarone induced lung toxicity/ILD: Supportive care.  Remote history of ASD repair  Deconditioning/debility: Appears very weak from acute illness-plans are for SNF when she is closer to discharge.  Consults  :  PCCM  Procedures  :  None  ABG:    Component Value Date/Time   PHART 7.427 05/15/2017 1158   PCO2ART 37.7 05/15/2017 1158   PO2ART 61.0 (L) 05/15/2017 1158   HCO3 24.9 05/15/2017 1158   TCO2 26 05/15/2017 1158   O2SAT 92.0 05/15/2017 1158    Vent Settings: N/A  Condition -stable  Family Communication  :  Brother-updated over the phone on 1/15  Code Status :  Full Code  Diet :  Diet Order            Diet heart healthy/carb modified Room service appropriate? Yes; Fluid consistency: Thin  Diet effective now               Disposition Plan  :  Remain hospitalized-SNF when she is stable for discharge.  Barriers to discharge: Hypoxia requiring O2 supplementation Antimicorbials  :    Anti-infectives (From admission, onward)   Start     Dose/Rate Route Frequency Ordered Stop   07/10/19 1000  ampicillin-sulbactam (UNASYN) 1.5 g in sodium chloride 0.9 % 100 mL IVPB     1.5 g 200 mL/hr over 30 Minutes Intravenous Every 8 hours 07/10/19 0945     07/04/19 1830  vancomycin (VANCOREADY) IVPB 750 mg/150 mL  Status:  Discontinued     750 mg 150 mL/hr over 60 Minutes Intravenous Every 48 hours 07/03/19 1249 07/04/19 1110   07/04/19 1800  vancomycin (VANCOCIN) IVPB 1000 mg/200 mL premix  Status:  Discontinued     1,000 mg 200 mL/hr over 60 Minutes Intravenous Every 48 hours 07/03/2019 1727 07/03/19 1249   07/03/19 1800  ceFEPIme (MAXIPIME) 1 g in sodium chloride 0.9 % 100 mL IVPB  Status:  Discontinued     1 g 200  mL/hr over 30 Minutes Intravenous Every 24 hours 06/30/2019 1725 07/03/19 1248   07/03/19 1800  ceFEPIme (MAXIPIME) 2 g in  sodium chloride 0.9 % 100 mL IVPB     2 g 200 mL/hr over 30 Minutes Intravenous Every 24 hours 07/03/19 1247 07/08/19 1850   07/03/19 1000  remdesivir 100 mg in sodium chloride 0.9 % 100 mL IVPB     100 mg 200 mL/hr over 30 Minutes Intravenous Daily 07/08/2019 1723 07/06/19 0926   07/03/19 1000  remdesivir 100 mg in sodium chloride 0.9 % 100 mL IVPB  Status:  Discontinued     100 mg 200 mL/hr over 30 Minutes Intravenous Daily 07/13/2019 2355 07/03/19 0257   07/26/2019 2354  remdesivir 200 mg in sodium chloride 0.9% 250 mL IVPB  Status:  Discontinued     200 mg 580 mL/hr over 30 Minutes Intravenous Once 07/25/2019 2355 07/03/19 0257   07/06/2019 1800  remdesivir 200 mg in sodium chloride 0.9% 250 mL IVPB     200 mg 580 mL/hr over 30 Minutes Intravenous Once 07/25/2019 1723 07/03/2019 2306   07/08/2019 1730  vancomycin (VANCOCIN) IVPB 1000 mg/200 mL premix  Status:  Discontinued     1,000 mg 200 mL/hr over 60 Minutes Intravenous  Once 07/13/2019 1722 07/21/2019 1725   07/06/2019 1730  ceFEPIme (MAXIPIME) 2 g in sodium chloride 0.9 % 100 mL IVPB     2 g 200 mL/hr over 30 Minutes Intravenous  Once 07/20/2019 1722 07/01/2019 1918   07/18/2019 1730  vancomycin (VANCOREADY) IVPB 1500 mg/300 mL     1,500 mg 150 mL/hr over 120 Minutes Intravenous  Once 07/08/2019 1725 06/29/2019 2036      Inpatient Medications  Scheduled Meds: . sodium chloride   Intravenous Once  . vitamin C  500 mg Oral Daily  . dexamethasone (DECADRON) injection  6 mg Intravenous Q24H  . fluticasone furoate-vilanterol  2 puff Inhalation Daily  . insulin aspart  0-9 Units Subcutaneous TID WC  . levothyroxine  100 mcg Oral QAC breakfast  . metoprolol succinate  50 mg Oral Daily  . pantoprazole  40 mg Oral BID  . pravastatin  10 mg Oral Daily  . Rivaroxaban  15 mg Oral Q breakfast  . sodium zirconium cyclosilicate  10 g Oral Daily   . zinc sulfate  220 mg Oral Daily   Continuous Infusions: . ampicillin-sulbactam (UNASYN) IV Stopped (07/11/19 0852)   PRN Meds:.acetaminophen, albuterol, chlorpheniramine-HYDROcodone, guaiFENesin-dextromethorphan, lip balm, senna-docusate   Time Spent in minutes 25  See all Orders from today for further details   Oren Binet M.D on 07/11/2019 at 2:16 PM  To page go to [email or web address] - use universal password  [facility] -  Office  [phone]    Objective:   Vitals:   07/11/19 0747 07/11/19 0748 07/11/19 0751 07/11/19 1130  BP:   (!) 146/92 (!) 137/92  Pulse: 71 (!) 55 73 77  Resp: (!) 24 20 (!) 25 (!) 22  Temp:   98.4 F (36.9 C) 97.6 F (36.4 C)  TempSrc:   Oral Oral  SpO2: 91% 92% 92% 90%  Weight:      Height:        Wt Readings from Last 3 Encounters:  06/29/2019 59.9 kg  04/01/19 65.8 kg  04/01/19 64.4 kg     Intake/Output Summary (Last 24 hours) at 07/11/2019 1416 Last data filed at 07/11/2019 0900 Gross per 24 hour  Intake 892.4 ml  Output --  Net 892.4 ml     Physical Exam Gen Exam:Alert awake-not in any distress-still appears weak and deconditioned but looks a little perked up compared to  yesterday. HEENT:atraumatic, normocephalic Chest: B/L clear to auscultation anteriorly CVS:S1S2 regular Abdomen:soft non tender, non distended Extremities:no edema Neurology: Non focal-but with generalized weakness. Skin: no rash  Data Review:    CBC Recent Labs  Lab 07/05/19 0530 07/05/19 0530 07/06/19 0330 07/06/19 0330 07/07/19 0352 07/08/19 0335 07/09/19 0513 07/10/19 0358 07/11/19 0239  WBC 13.7*   < > 15.1*   < > 16.8* 17.5* 17.0* 17.9* 20.1*  HGB 15.3*   < > 15.5*   < > 16.1* 15.6* 14.9 14.6 15.1*  HCT 47.6*   < > 47.3*   < > 49.0* 47.9* 45.5 44.7 47.4*  PLT 335   < > 291   < > 255 254 250 209 225  MCV 95.2   < > 93.7   < > 95.1 94.9 94.6 95.9 97.3  MCH 30.6   < > 30.7   < > 31.3 30.9 31.0 31.3 31.0  MCHC 32.1   < > 32.8   < >  32.9 32.6 32.7 32.7 31.9  RDW 13.7   < > 13.6   < > 13.9 14.2 14.2 14.4 14.5  LYMPHSABS 0.5*  --  0.6*  --  0.7  --   --   --   --   MONOABS 0.3  --  0.4  --  0.6  --   --   --   --   EOSABS 0.0  --  0.0  --  0.0  --   --   --   --   BASOSABS 0.0  --  0.1  --  0.1  --   --   --   --    < > = values in this interval not displayed.    Chemistries  Recent Labs  Lab 07/07/19 0352 07/07/19 0352 07/08/19 0335 07/09/19 0513 07/09/19 1700 07/10/19 0358 07/11/19 0239  NA 149*   < > 150* 147* 149* 142 144  K 3.9   < > 4.3 5.7* 4.1 4.2 4.4  CL 109   < > 114* 118* 113* 112* 111  CO2 23   < > 22 18* 22 21* 22  GLUCOSE 66*   < > 80 177* 151* 223* 141*  BUN 80*   < > 85* 84* 78* 62* 52*  CREATININE 1.75*   < > 1.76* 1.76* 1.72* 1.43* 1.26*  CALCIUM 9.1   < > 9.2 8.8* 9.2 8.8* 9.2  AST 28  --  24 33  --  28 30  ALT 23  --  23 23  --  22 22  ALKPHOS 115  --  117 148*  --  126 141*  BILITOT 1.2  --  1.3* 1.6*  --  1.6* 1.7*   < > = values in this interval not displayed.   ------------------------------------------------------------------------------------------------------------------ No results for input(s): CHOL, HDL, LDLCALC, TRIG, CHOLHDL, LDLDIRECT in the last 72 hours.  Lab Results  Component Value Date   HGBA1C 6.8 (H) 07/06/2019   ------------------------------------------------------------------------------------------------------------------ No results for input(s): TSH, T4TOTAL, T3FREE, THYROIDAB in the last 72 hours.  Invalid input(s): FREET3 ------------------------------------------------------------------------------------------------------------------ Recent Labs    07/10/19 0358 07/11/19 0239  FERRITIN 1,184* 1,105*    Coagulation profile No results for input(s): INR, PROTIME in the last 168 hours.  Recent Labs    07/10/19 0358 07/11/19 0239  DDIMER 2.91* 2.89*    Cardiac Enzymes No results for input(s): CKMB, TROPONINI, MYOGLOBIN in the last 168  hours.  Invalid input(s): CK ------------------------------------------------------------------------------------------------------------------    Component  Value Date/Time   BNP 1,321.0 (H) 07/07/2019 1455    Micro Results Recent Results (from the past 240 hour(s))  Blood Culture (routine x 2)     Status: None   Collection Time: 07/26/2019  2:54 PM   Specimen: Left Antecubital; Blood  Result Value Ref Range Status   Specimen Description LEFT ANTECUBITAL  Final   Special Requests   Final    BOTTLES DRAWN AEROBIC AND ANAEROBIC Blood Culture adequate volume   Culture   Final    NO GROWTH 5 DAYS Performed at [facility], [address] [place], [place] [address]    Report Status 07/07/2019 FINAL  Final  Blood Culture (routine x 2)     Status: None   Collection Time: 07/09/2019  4:34 PM   Specimen: BLOOD LEFT ARM  Result Value Ref Range Status   Specimen Description BLOOD LEFT ARM  Final   Special Requests   Final    BOTTLES DRAWN AEROBIC AND ANAEROBIC Blood Culture adequate volume   Culture   Final    NO GROWTH 5 DAYS Performed at [facility], [address], [place], [place] [address]    Report Status 07/07/2019 FINAL  Final    Radiology Reports DG CHEST PORT 1 VIEW  Result Date: 07/09/2019 CLINICAL DATA:  Shortness of breath EXAM: PORTABLE CHEST 1 VIEW COMPARISON:  07/06/2019 FINDINGS: Patchy bilateral airspace disease again noted, worsening since prior study, particularly in the left lower lobe. Cardiomegaly. Pacer remains in place, unchanged. No effusions or pneumothorax. No acute bony abnormality. IMPRESSION: Patchy bilateral airspace opacities, worsening since prior study, particularly in the left lower lobe. Electronically Signed   By: Rolm Baptise M.D.   On: 07/09/2019 21:47   DG Chest Port 1 View  Result Date: 07/03/2019 CLINICAL DATA:  COVID-19 positive.  Worsening shortness of breath. EXAM: PORTABLE CHEST 1 VIEW COMPARISON:  07/13/2019. FINDINGS: Surgical clips noted  over the neck. AICD in stable position. Stable cardiomegaly. Diffuse severe bilateral pulmonary infiltrates again noted. Slight improvement in aeration on today's exam. No pleural effusion pneumothorax. Old infarct proximal right humerus. No acute bony abnormality. IMPRESSION: 1.  AICD in stable position.  Stable cardiomegaly. 2. Diffuse severe bilateral pulmonary infiltrates again noted. Slight improvement in aeration on today's exam. Electronically Signed   By: Marcello Moores  Register   On: 07/03/2019 07:15   DG Chest Port 1 View  Result Date: 06/28/2019 CLINICAL DATA:  Hypoxia. EXAM: PORTABLE CHEST 1 VIEW COMPARISON:  None. FINDINGS: There is a dual lead AICD. Moderate severity diffuse bilateral infiltrates are seen. There is a small right pleural effusion. No pneumothorax is identified. The cardiac silhouette is markedly enlarged. Multiple radiopaque surgical clips are seen overlying the superior mediastinum. There is marked severity levoscoliosis of the thoracic spine with moderate severity multilevel degenerative changes. IMPRESSION: 1. Moderate severity diffuse bilateral infiltrates with a small right pleural effusion. 2. Marked enlargement of the cardiac silhouette. 3. Dual lead AICD in place. Electronically Signed   By: Virgina Norfolk M.D.   On: 07/01/2019 15:50   DG Chest Port 1V today  Result Date: 07/06/2019 CLINICAL DATA:  Dyspnea, COVID-19 positive EXAM: PORTABLE CHEST 1 VIEW COMPARISON:  07/03/2019 chest radiograph. FINDINGS: Stable configuration of 2 lead left subclavian pacemaker. Surgical clips overlie the central lower neck. Stable cardiomediastinal silhouette with mild cardiomegaly. No pneumothorax. No pleural effusion. Patchy hazy opacities throughout the mid to lower lungs bilaterally, similar to minimally improved. IMPRESSION: Patchy hazy opacities throughout the mid to lower lungs bilaterally,  similar to minimally improved, compatible with COVID-19 pneumonia. Stable cardiomegaly.  Electronically Signed   By: Ilona Sorrel M.D.   On: 07/06/2019 13:09   MM 3D SCREEN BREAST BILATERAL  Result Date: 06/12/2019 CLINICAL DATA:  Screening. EXAM: DIGITAL SCREENING BILATERAL MAMMOGRAM WITH TOMO AND CAD COMPARISON:  Previous exam(s). ACR Breast Density Category a: The breast tissue is almost entirely fatty. FINDINGS: There are no findings suspicious for malignancy. Images were processed with CAD. IMPRESSION: No mammographic evidence of malignancy. A result letter of this screening mammogram will be mailed directly to the patient. RECOMMENDATION: Screening mammogram in one year. (Code:SM-B-01Y) BI-RADS CATEGORY  1: Negative. Electronically Signed   By: Nolon Nations M.D.   On: 06/12/2019 16:42

## 2019-07-11 NOTE — Plan of Care (Signed)

## 2019-07-12 LAB — COMPREHENSIVE METABOLIC PANEL
ALT: 21 U/L (ref 0–44)
AST: 22 U/L (ref 15–41)
Albumin: 2.3 g/dL — ABNORMAL LOW (ref 3.5–5.0)
Alkaline Phosphatase: 127 U/L — ABNORMAL HIGH (ref 38–126)
Anion gap: 10 (ref 5–15)
BUN: 49 mg/dL — ABNORMAL HIGH (ref 8–23)
CO2: 22 mmol/L (ref 22–32)
Calcium: 8.8 mg/dL — ABNORMAL LOW (ref 8.9–10.3)
Chloride: 107 mmol/L (ref 98–111)
Creatinine, Ser: 1.24 mg/dL — ABNORMAL HIGH (ref 0.44–1.00)
GFR calc Af Amer: 54 mL/min — ABNORMAL LOW (ref >60)
GFR calc non Af Amer: 46 mL/min — ABNORMAL LOW (ref >60)
Glucose, Bld: 137 mg/dL — ABNORMAL HIGH (ref 70–99)
Potassium: 4.4 mmol/L (ref 3.5–5.1)
Sodium: 139 mmol/L (ref 135–145)
Total Bilirubin: 1.1 mg/dL (ref 0.3–1.2)
Total Protein: 5.8 g/dL — ABNORMAL LOW (ref 6.5–8.1)

## 2019-07-12 LAB — GLUCOSE, CAPILLARY
Glucose-Capillary: 243 mg/dL — ABNORMAL HIGH (ref 70–99)
Glucose-Capillary: 305 mg/dL — ABNORMAL HIGH (ref 70–99)
Glucose-Capillary: 320 mg/dL — ABNORMAL HIGH (ref 70–99)
Glucose-Capillary: 251 mg/dL — ABNORMAL HIGH (ref 70–99)

## 2019-07-12 LAB — CBC
HCT: 45.8 % (ref 36.0–46.0)
Hemoglobin: 14.4 g/dL (ref 12.0–15.0)
MCH: 30.6 pg (ref 26.0–34.0)
MCHC: 31.4 g/dL (ref 30.0–36.0)
MCV: 97.2 fL (ref 80.0–100.0)
Platelets: 199 10*3/uL (ref 150–400)
RBC: 4.71 MIL/uL (ref 3.87–5.11)
RDW: 14.3 % (ref 11.5–15.5)
WBC: 16.5 10*3/uL — ABNORMAL HIGH (ref 4.0–10.5)
nRBC: 0 % (ref 0.0–0.2)

## 2019-07-12 LAB — FERRITIN: Ferritin: 1091 ng/mL — ABNORMAL HIGH (ref 11–307)

## 2019-07-12 LAB — C-REACTIVE PROTEIN: CRP: 1.3 mg/dL — ABNORMAL HIGH (ref <1.0)

## 2019-07-12 LAB — D-DIMER, QUANTITATIVE: D-Dimer, Quant: 2.12 ug/mL-FEU — ABNORMAL HIGH (ref 0.00–0.50)

## 2019-07-12 MED ORDER — FUROSEMIDE 10 MG/ML IJ SOLN
40.0000 mg | Freq: Once | INTRAMUSCULAR | Status: AC
  Administered 2019-07-12: 40 mg via INTRAVENOUS
  Filled 2019-07-12: qty 4

## 2019-07-12 NOTE — NC FL2 (Signed)
[place] LEVEL OF CARE SCREENING TOOL     IDENTIFICATION  Patient Name: [name] Birthdate: 1955/09/10 Sex: female Admission Date (Current Location): 07/06/2019  [facility] and [place] Number:  Herbalist and Address:  [facility] [place], [place] [address], [place], [place] [address]      Provider Number: [id number]  Attending Physician Name and Address:  Jonetta Osgood, MD  Relative Name and Phone Number:  Abagail Reaney [phone]    Current Level of Care: Hospital Recommended Level of Care: [place] Prior Approval Number:    Date Approved/Denied:   PASRR Number: [id number]  Discharge Plan: SNF    Current Diagnoses: Patient Active Problem List   Diagnosis Date Noted  . AKI (acute kidney injury) ([place]) 07/01/2019  . SIRS (systemic inflammatory response syndrome) ([place]) 07/16/2019  . Acute respiratory disease due to COVID-19 virus 06/29/2019  . Asthma 03/11/2019  . Atrial fibrillation with rapid ventricular response ([place]) 06/29/2017  . Acute bronchitis 05/24/2017  . CAD (coronary artery disease) 05/17/2017  . Elevated troponin   . Acute systolic heart failure ([place])   . Non-ST elevation (NSTEMI) myocardial infarction ([place])   . Non-ischemic cardiomyopathy ([place])   . Acute on chronic combined systolic and diastolic CHF (congestive heart failure) ([place]) 05/10/2017  . Persistent atrial fibrillation ([place]) 05/09/2017  . LLQ pain 01/04/2017  . ILD (interstitial lung disease) ([place]) 11/21/2016  . Hemorrhoids 08/21/2016  . IDA (iron deficiency anemia) 01/11/2016  . Diverticulosis of colon without hemorrhage   . Abdominal pain, right upper quadrant 10/29/2015  . Rectal bleeding 10/29/2015  . Normocytic anemia 10/29/2015  . Dysphagia   . Abnormality of esophagus 04/29/2015  . Esophageal dysphagia 03/26/2015  . OSA (obstructive sleep apnea) 12/31/2013  . Atrial fibrillation with RVR ([place]) 11/09/2013  . CKD  (chronic kidney disease), stage III 11/09/2013  . Hyperkalemia 11/07/2013  . Diastolic dysfunction [id number]  . DM (diabetes mellitus) ([place]) 11/07/2013  . Varicose veins of lower extremities with ulcer ([place]) 10/17/2013  . Cold-Left Hand / Foot 10/08/2013  . Circulation problem-Left foot 10/08/2013  . Atherosclerosis of native arteries of the extremities with ulceration(440.23) 10/08/2013  . A-fib ([place]) 08/05/2013  . Constipation 04/11/2013  . Peripheral vascular disease, unspecified ([place]) 10/27/2011  . Varicose veins of bilateral lower extremities with other complications 0000000  . COLLAGENOUS COLITIS 04/08/2010  . GERD 02/18/2010  . HYPERTHYROIDISM 02/11/2010    Orientation RESPIRATION BLADDER Height & Weight     Self, Time, Situation, Place  O2(HFNC) Continent, External catheter Weight: 132 lb 0.9 oz (59.9 kg) Height:  4\' 10"  (147.3 cm)  BEHAVIORAL SYMPTOMS/MOOD NEUROLOGICAL BOWEL NUTRITION STATUS      Continent    AMBULATORY STATUS COMMUNICATION OF NEEDS Skin   Extensive Assist Verbally Normal                       Personal Care Assistance Level of Assistance  Bathing, Dressing, Feeding Bathing Assistance: Maximum assistance Feeding assistance: Independent Dressing Assistance: Maximum assistance     Functional Limitations Info             SPECIAL CARE FACTORS FREQUENCY  PT (By licensed PT), OT (By licensed OT)     PT Frequency: 5 times a week OT Frequency: 5 times a week            Contractures      Additional Factors Info  Code Status, Isolation Precautions Code Status Info: Full  Isolation Precautions Info: COVID +     Current Medications (07/12/2019):  This is the current hospital active medication list Current Facility-Administered Medications  Medication Dose Route Frequency Provider Last Rate Last Admin  . 0.9 %  sodium chloride infusion (Manually program via Guardrails IV Fluids)   Intravenous Once Jonetta Osgood, MD       . acetaminophen (TYLENOL) tablet 650 mg  650 mg Oral Q6H PRN Elgergawy, Silver Huguenin, MD   650 mg at 07/03/19 2328  . albuterol (VENTOLIN HFA) 108 (90 Base) MCG/ACT inhaler 2 puff  2 puff Inhalation Q6H PRN Elgergawy, Silver Huguenin, MD   2 puff at 07/12/19 0923  . ampicillin-sulbactam (UNASYN) 1.5 g in sodium chloride 0.9 % 100 mL IVPB  1.5 g Intravenous Q8H Ghimire, Shanker M, MD 200 mL/hr at 07/12/19 0920 1.5 g at 07/12/19 0920  . ascorbic acid (VITAMIN C) tablet 500 mg  500 mg Oral Daily Elgergawy, Silver Huguenin, MD   500 mg at 07/12/19 0921  . chlorpheniramine-HYDROcodone (TUSSIONEX) 10-8 MG/5ML suspension 5 mL  5 mL Oral Q12H PRN Elgergawy, Silver Huguenin, MD      . fluticasone furoate-vilanterol (BREO ELLIPTA) 100-25 MCG/INH 2 puff  2 puff Inhalation Daily Elgergawy, Silver Huguenin, MD   2 puff at 07/12/19 [phone]  . guaiFENesin-dextromethorphan (ROBITUSSIN DM) 100-10 MG/5ML syrup 10 mL  10 mL Oral Q4H PRN Elgergawy, Silver Huguenin, MD   10 mL at 07/06/19 0503  . insulin aspart (novoLOG) injection 0-9 Units  0-9 Units Subcutaneous TID WC Jonetta Osgood, MD   7 Units at 07/12/19 1126  . levothyroxine (SYNTHROID) tablet 100 mcg  100 mcg Oral QAC breakfast Elgergawy, Silver Huguenin, MD   100 mcg at 07/12/19 [phone]  . lip balm (CARMEX) ointment 1 application  1 application Topical PRN Jonetta Osgood, MD   1 application at [id number]  . metoprolol succinate (TOPROL-XL) 24 hr tablet 50 mg  50 mg Oral Daily Elgergawy, Silver Huguenin, MD   50 mg at 07/12/19 0921  . pantoprazole (PROTONIX) EC tablet 40 mg  40 mg Oral BID Elgergawy, Silver Huguenin, MD   40 mg at 07/12/19 0921  . pravastatin (PRAVACHOL) tablet 10 mg  10 mg Oral Daily Elgergawy, Silver Huguenin, MD   10 mg at 07/12/19 0921  . Rivaroxaban (XARELTO) tablet 15 mg  15 mg Oral Q breakfast Elgergawy, Silver Huguenin, MD   15 mg at 07/12/19 0742  . senna-docusate (Senokot-S) tablet 1 tablet  1 tablet Oral QHS PRN Elgergawy, Silver Huguenin, MD      . sodium zirconium cyclosilicate (LOKELMA) packet 10 g  10 g  Oral Daily Jonetta Osgood, MD   10 g at 07/12/19 [id number]  . zinc sulfate capsule 220 mg  220 mg Oral Daily Elgergawy, Silver Huguenin, MD   220 mg at 07/12/19 [id number]     Discharge Medications: Please see discharge summary for a list of discharge medications.  Relevant Imaging Results:  Relevant Lab Results:   Additional Information ss # [id number] Will require 30 days of skilled services  Atilano Median, LCSW

## 2019-07-12 NOTE — Plan of Care (Signed)
Pt A&O x4. Pt and brother Eddie Dibbles updated on plan of care. MD ordered OT 40mg  IV Lasix. VSS on 5L HFNC, pt requiring 10L with exertion. Will continue to follow POC at this time.   Problem: Education: Goal: Knowledge of General Education information will improve Description: Including pain rating scale, medication(s)/side effects and non-pharmacologic comfort measures Outcome: Progressing   Problem: Health Behavior/Discharge Planning: Goal: Ability to manage health-related needs will improve Outcome: Progressing   Problem: Clinical Measurements: Goal: Ability to maintain clinical measurements within normal limits will improve Outcome: Progressing Goal: Will remain free from infection Outcome: Progressing Goal: Diagnostic test results will improve Outcome: Progressing Goal: Respiratory complications will improve Outcome: Progressing Goal: Cardiovascular complication will be avoided Outcome: Progressing   Problem: Activity: Goal: Risk for activity intolerance will decrease Outcome: Progressing   Problem: Nutrition: Goal: Adequate nutrition will be maintained Outcome: Progressing   Problem: Coping: Goal: Level of anxiety will decrease Outcome: Progressing   Problem: Elimination: Goal: Will not experience complications related to bowel motility Outcome: Progressing Goal: Will not experience complications related to urinary retention Outcome: Progressing   Problem: Pain Managment: Goal: General experience of comfort will improve Outcome: Progressing   Problem: Safety: Goal: Ability to remain free from injury will improve Outcome: Progressing   Problem: Skin Integrity: Goal: Risk for impaired skin integrity will decrease Outcome: Progressing   Problem: Education: Goal: Knowledge of risk factors and measures for prevention of condition will improve Outcome: Progressing   Problem: Coping: Goal: Psychosocial and spiritual needs will be supported Outcome: Progressing    Problem: Respiratory: Goal: Will maintain a patent airway Outcome: Progressing Goal: Complications related to the disease process, condition or treatment will be avoided or minimized Outcome: Progressing

## 2019-07-12 NOTE — Progress Notes (Addendum)
Rehab Admissions Coordinator Note:  Per PM&R MD consult note, this patient has the potential to be a good IP Rehab candidate once medically ready. I will place a rehab consult order in the chart to allow for [facility] follow up and further assessment of candidacy once ready. Noted pt was COVID + 12/31. Pt will need to have two negative tests or be >20 days post initial COVID + diagnosis (~1/20) prior to potential CIR admit.   Please call if questions.  Raechel Ache, OTR/L  Rehab Admissions Coordinator  [phone] 07/12/2019 12:24 PM

## 2019-07-12 NOTE — Progress Notes (Addendum)
PROGRESS NOTE                                                                                                                                                                                                             Patient Demographics:    [name], is a 64 y.o. female, DOB - 1956-06-10, [id number]  Outpatient Primary MD for the patient is Celene Squibb, MD   Admit date - 07/08/2019   LOS - 10  Chief Complaint  Patient presents with  . Shortness of Breath  . covid pos       Brief Narrative: Patient is a 64 y.o. 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    Post admission-she had significant worsening of her hypoxemia-requiring 100% NRB-she was then transferred to [facility].  Hypoxia gradually improved with steroids/remdesivir and empiric antibiotics-however on 1/12-1/13-hypoxia briefly worsened-chest x-ray showed a new left-sided infiltrate.  Hospital course was also complicated by development of AKI and hypernatremia.  Patient was provided supportive care with IV fluids, and was restarted on IV antibiotics (Unasyn)-with further improvement of hypoxemia.  See below for further details.    COVID-19 Medications:  Steroids: 1/6>> 1/15 Remdesivir: 1/6>>1/10 Convalescent Plasma: 1/7   Antibiotics: Unasyn: 1/14>> Cefepime: 1/6>>1/12 Vancomycin: 1/6>>1/8   Subjective:   She looks a whole lot better this morning-she is awake-alert-on 4 L of oxygen.   Assessment  & Plan :   Acute Hypoxic Resp Failure due to  Covid 19 Viral pneumonia and concurrent bacterial pneumonia: Slowly continues to improve-oxygen requirements have waxed and waned over the past 2 days-but requiring anywhere from 2 to 5 L of oxygen.  Continue Unasyn-completed remdesivir on 1/10 and steroids on 1/15.  Appears to have more lower extremity edema today-since hyponatremia has resolved-renal function continues to improve-we will give 1 dose of IV Lasix today.    Fever: afebrile  O2 requirements:  SpO2: 94 % O2 Flow Rate (L/min): 5 L/min   COVID-19 Labs: Recent Labs    07/10/19 0358 07/11/19  0239 07/12/19 0150  DDIMER 2.91* 2.89* 2.12*  FERRITIN 1,184* 1,105* 1,091*  CRP 1.4* 2.0* 1.3*       Component Value Date/Time   BNP 1,321.0 (H) 07/04/2019 1455    Recent Labs  Lab 07/07/19 1019 07/08/19 0335  PROCALCITON 0.61 0.28    Lab Results  Component Value Date   SARSCOV2NAA Detected (A) 06/26/2019   [place] NEGATIVE 02/20/2019   [place] Not Detected 12/18/2018    Prone/Incentive Spirometry: encouraged  incentive spirometry use 3-4/hour.  DVT Prophylaxis  : Xarelto  AKI on CKD stage IIIa: AKI likely hemodynamically mediated-creatinine improving.  Continue supportive care.    Hypernatremia: Resolved with gentle hydration-follow.  Hyperkalemia: Resolved with D50/insulin-continue Lokelma for a few more days.   Acute metabolic encephalopathy: Does not appear to be confused-she is hard of hearing-and is following commands.  Suspect mild metabolic encephalopathy was from hypoxemia, hyponatremia and AKI.    HTN: BP controlled-continue metoprolol  Persistent atrial fibrillation s/p AV node ablation and PPM insertion: Continue metoprolol-on Xarelto.  Rate under good control.  Paced rhythm on telemetry.  Note has history of amiodarone induced lung toxicity  Chronic combined systolic and diastolic heart failure: Euvolemic-does appear to have some more lower extremity edema today-now that hyponatremia and AKI  have stabilized-we will be starting Lasix today.  Bronchial asthma: Appears stable-no rhonchi-continue bronchodilators.  DM-2 (A1c 6.8): Has had hypoglycemic episodes a few days back-CBGs now trending back up-patient's appetite is seems to be slowly improving.  Start Lantus 10 units nightly-continue SSI and follow.  CBG (last 3)  Recent Labs    07/11/19 2028 07/12/19 0723 07/12/19 1121  GLUCAP 185* 243* 305*   Hypothyroidism: Continue levothyroxine  History of amiodarone induced lung toxicity/ILD: Supportive care.  Remote history of ASD repair  Deconditioning/debility: Appears very weak from acute illness-plans are for SNF when she is closer to discharge.  Consults  :  PCCM  Procedures  :  None  ABG:    Component Value Date/Time   PHART 7.427 05/15/2017 1158   PCO2ART 37.7 05/15/2017 1158   PO2ART 61.0 (L) 05/15/2017 1158   HCO3 24.9 05/15/2017 1158   TCO2 26 05/15/2017 1158   O2SAT 92.0 05/15/2017 1158    Vent Settings: N/A  Condition -stable  Family Communication  :  Brother-updated over the phone on 1/16  Code Status :  Full Code  Diet :  Diet Order            Diet heart healthy/carb modified Room service appropriate? Yes; Fluid consistency: Thin  Diet effective now               Disposition Plan  :  Remain hospitalized-SNF versus CIR when she is stable for discharge.  Barriers to discharge: Hypoxia requiring O2 supplementation  Antimicorbials  :    Anti-infectives (From admission, onward)   Start     Dose/Rate Route Frequency Ordered Stop   07/10/19 1000  ampicillin-sulbactam (UNASYN) 1.5 g in sodium chloride 0.9 % 100 mL IVPB     1.5 g 200 mL/hr over 30 Minutes Intravenous Every 8 hours 07/10/19 0945     07/04/19 1830  vancomycin (VANCOREADY) IVPB 750 mg/150 mL  Status:  Discontinued     750 mg 150 mL/hr over 60 Minutes Intravenous Every 48 hours 07/03/19 1249 07/04/19 1110   07/04/19 1800  vancomycin (VANCOCIN) IVPB 1000 mg/200 mL premix   Status:  Discontinued     1,000 mg 200 mL/hr over 60 Minutes Intravenous Every 48  hours 07/21/2019 1727 07/03/19 1249   07/03/19 1800  ceFEPIme (MAXIPIME) 1 g in sodium chloride 0.9 % 100 mL IVPB  Status:  Discontinued     1 g 200 mL/hr over 30 Minutes Intravenous Every 24 hours 07/11/2019 1725 07/03/19 1248   07/03/19 1800  ceFEPIme (MAXIPIME) 2 g in sodium chloride 0.9 % 100 mL IVPB     2 g 200 mL/hr over 30 Minutes Intravenous Every 24 hours 07/03/19 1247 07/08/19 1850   07/03/19 1000  remdesivir 100 mg in sodium chloride 0.9 % 100 mL IVPB     100 mg 200 mL/hr over 30 Minutes Intravenous Daily 07/06/2019 1723 07/06/19 0926   07/03/19 1000  remdesivir 100 mg in sodium chloride 0.9 % 100 mL IVPB  Status:  Discontinued     100 mg 200 mL/hr over 30 Minutes Intravenous Daily 07/20/2019 2355 07/03/19 0257   07/26/2019 2354  remdesivir 200 mg in sodium chloride 0.9% 250 mL IVPB  Status:  Discontinued     200 mg 580 mL/hr over 30 Minutes Intravenous Once 07/01/2019 2355 07/03/19 0257   07/20/2019 1800  remdesivir 200 mg in sodium chloride 0.9% 250 mL IVPB     200 mg 580 mL/hr over 30 Minutes Intravenous Once 07/08/2019 1723 07/17/2019 2306   07/01/2019 1730  vancomycin (VANCOCIN) IVPB 1000 mg/200 mL premix  Status:  Discontinued     1,000 mg 200 mL/hr over 60 Minutes Intravenous  Once 07/25/2019 1722 07/08/2019 1725   06/28/2019 1730  ceFEPIme (MAXIPIME) 2 g in sodium chloride 0.9 % 100 mL IVPB     2 g 200 mL/hr over 30 Minutes Intravenous  Once 07/01/2019 1722 07/07/2019 1918   06/29/2019 1730  vancomycin (VANCOREADY) IVPB 1500 mg/300 mL     1,500 mg 150 mL/hr over 120 Minutes Intravenous  Once 07/01/2019 1725 07/15/2019 2036      Inpatient Medications  Scheduled Meds: . sodium chloride   Intravenous Once  . vitamin C  500 mg Oral Daily  . fluticasone furoate-vilanterol  2 puff Inhalation Daily  . insulin aspart  0-9 Units Subcutaneous TID WC  . levothyroxine  100 mcg Oral QAC breakfast  . metoprolol succinate  50  mg Oral Daily  . pantoprazole  40 mg Oral BID  . pravastatin  10 mg Oral Daily  . Rivaroxaban  15 mg Oral Q breakfast  . sodium zirconium cyclosilicate  10 g Oral Daily  . zinc sulfate  220 mg Oral Daily   Continuous Infusions: . ampicillin-sulbactam (UNASYN) IV 1.5 g (07/12/19 0920)   PRN Meds:.acetaminophen, albuterol, chlorpheniramine-HYDROcodone, guaiFENesin-dextromethorphan, lip balm, senna-docusate   Time Spent in minutes 25  See all Orders from today for further details   Oren Binet M.D on 07/12/2019 at 2:35 PM  To page go to [email or web address] - use universal password  [facility] -  Office  [phone]    Objective:   Vitals:   07/12/19 0415 07/12/19 0430 07/12/19 0725 07/12/19 1123  BP:   117/78 111/73  Pulse: 70 76 75 73  Resp: (!) 25 20 (!) 22 (!) 23  Temp:   (!) 97.5 F (36.4 C) (!) 97.5 F (36.4 C)  TempSrc:   Oral Axillary  SpO2: 94% 94% 95% 94%  Weight:      Height:        Wt Readings from Last 3 Encounters:  07/01/2019 59.9 kg  04/01/19 65.8 kg  04/01/19 64.4 kg     Intake/Output Summary (Last 24 hours) at 07/12/2019 1435  Last data filed at 07/12/2019 1125 Gross per 24 hour  Intake 1400 ml  Output 200 ml  Net 1200 ml     Physical Exam Gen Exam:Alert awake-not in any distress-but looks chronically ill-appearing. HEENT:atraumatic, normocephalic Chest: B/L clear to auscultation anteriorly CVS:S1S2 regular Abdomen:soft non tender, non distended Extremities:+ edema Neurology: Non focal Skin: no rash  Data Review:    CBC Recent Labs  Lab 07/06/19 0330 07/06/19 0330 07/07/19 0352 07/07/19 0352 07/08/19 0335 07/09/19 0513 07/10/19 0358 07/11/19 0239 07/12/19 0150  WBC 15.1*   < > 16.8*   < > 17.5* 17.0* 17.9* 20.1* 16.5*  HGB 15.5*   < > 16.1*   < > 15.6* 14.9 14.6 15.1* 14.4  HCT 47.3*   < > 49.0*   < > 47.9* 45.5 44.7 47.4* 45.8  PLT 291   < > 255   < > 254 250 209 225 199  MCV 93.7   < > 95.1   < > 94.9 94.6 95.9 97.3  97.2  MCH 30.7   < > 31.3   < > 30.9 31.0 31.3 31.0 30.6  MCHC 32.8   < > 32.9   < > 32.6 32.7 32.7 31.9 31.4  RDW 13.6   < > 13.9   < > 14.2 14.2 14.4 14.5 14.3  LYMPHSABS 0.6*  --  0.7  --   --   --   --   --   --   MONOABS 0.4  --  0.6  --   --   --   --   --   --   EOSABS 0.0  --  0.0  --   --   --   --   --   --   BASOSABS 0.1  --  0.1  --   --   --   --   --   --    < > = values in this interval not displayed.    Chemistries  Recent Labs  Lab 07/08/19 0335 07/08/19 0335 07/09/19 0513 07/09/19 1700 07/10/19 0358 07/11/19 0239 07/12/19 0150  NA 150*   < > 147* 149* 142 144 139  K 4.3   < > 5.7* 4.1 4.2 4.4 4.4  CL 114*   < > 118* 113* 112* 111 107  CO2 22   < > 18* 22 21* 22 22  GLUCOSE 80   < > 177* 151* 223* 141* 137*  BUN 85*   < > 84* 78* 62* 52* 49*  CREATININE 1.76*   < > 1.76* 1.72* 1.43* 1.26* 1.24*  CALCIUM 9.2   < > 8.8* 9.2 8.8* 9.2 8.8*  AST 24  --  33  --  28 30 22   ALT 23  --  23  --  22 22 21   ALKPHOS 117  --  148*  --  126 141* 127*  BILITOT 1.3*  --  1.6*  --  1.6* 1.7* 1.1   < > = values in this interval not displayed.   ------------------------------------------------------------------------------------------------------------------ No results for input(s): CHOL, HDL, LDLCALC, TRIG, CHOLHDL, LDLDIRECT in the last 72 hours.  Lab Results  Component Value Date   HGBA1C 6.8 (H) 07/06/2019   ------------------------------------------------------------------------------------------------------------------ No results for input(s): TSH, T4TOTAL, T3FREE, THYROIDAB in the last 72 hours.  Invalid input(s): FREET3 ------------------------------------------------------------------------------------------------------------------ Recent Labs    07/11/19 0239 07/12/19 0150  FERRITIN 1,105* 1,091*    Coagulation profile No results for input(s): INR, PROTIME in the last 168 hours.  Recent Labs    07/11/19 0239 07/12/19 0150  DDIMER 2.89* 2.12*     Cardiac Enzymes No results for input(s): CKMB, TROPONINI, MYOGLOBIN in the last 168 hours.  Invalid input(s): CK ------------------------------------------------------------------------------------------------------------------    Component Value Date/Time   BNP 1,321.0 (H) 07/08/2019 1455    Micro Results Recent Results (from the past 240 hour(s))  Blood Culture (routine x 2)     Status: None   Collection Time: 07/07/2019  2:54 PM   Specimen: Left Antecubital; Blood  Result Value Ref Range Status   Specimen Description LEFT ANTECUBITAL  Final   Special Requests   Final    BOTTLES DRAWN AEROBIC AND ANAEROBIC Blood Culture adequate volume   Culture   Final    NO GROWTH 5 DAYS Performed at [facility], [address] [place], [place] [address]    Report Status 07/07/2019 FINAL  Final  Blood Culture (routine x 2)     Status: None   Collection Time: 07/01/2019  4:34 PM   Specimen: BLOOD LEFT ARM  Result Value Ref Range Status   Specimen Description BLOOD LEFT ARM  Final   Special Requests   Final    BOTTLES DRAWN AEROBIC AND ANAEROBIC Blood Culture adequate volume   Culture   Final    NO GROWTH 5 DAYS Performed at [facility], [address], [place], [place] [address]    Report Status 07/07/2019 FINAL  Final    Radiology Reports DG CHEST PORT 1 VIEW  Result Date: 07/09/2019 CLINICAL DATA:  Shortness of breath EXAM: PORTABLE CHEST 1 VIEW COMPARISON:  07/06/2019 FINDINGS: Patchy bilateral airspace disease again noted, worsening since prior study, particularly in the left lower lobe. Cardiomegaly. Pacer remains in place, unchanged. No effusions or pneumothorax. No acute bony abnormality. IMPRESSION: Patchy bilateral airspace opacities, worsening since prior study, particularly in the left lower lobe. Electronically Signed   By: Rolm Baptise M.D.   On: 07/09/2019 21:47   DG Chest Port 1 View  Result Date: 07/03/2019 CLINICAL DATA:  COVID-19 positive.  Worsening shortness of  breath. EXAM: PORTABLE CHEST 1 VIEW COMPARISON:  07/09/2019. FINDINGS: Surgical clips noted over the neck. AICD in stable position. Stable cardiomegaly. Diffuse severe bilateral pulmonary infiltrates again noted. Slight improvement in aeration on today's exam. No pleural effusion pneumothorax. Old infarct proximal right humerus. No acute bony abnormality. IMPRESSION: 1.  AICD in stable position.  Stable cardiomegaly. 2. Diffuse severe bilateral pulmonary infiltrates again noted. Slight improvement in aeration on today's exam. Electronically Signed   By: Marcello Moores  Register   On: 07/03/2019 07:15   DG Chest Port 1 View  Result Date: 06/30/2019 CLINICAL DATA:  Hypoxia. EXAM: PORTABLE CHEST 1 VIEW COMPARISON:  None. FINDINGS: There is a dual lead AICD. Moderate severity diffuse bilateral infiltrates are seen. There is a small right pleural effusion. No pneumothorax is identified. The cardiac silhouette is markedly enlarged. Multiple radiopaque surgical clips are seen overlying the superior mediastinum. There is marked severity levoscoliosis of the thoracic spine with moderate severity multilevel degenerative changes. IMPRESSION: 1. Moderate severity diffuse bilateral infiltrates with a small right pleural effusion. 2. Marked enlargement of the cardiac silhouette. 3. Dual lead AICD in place. Electronically Signed   By: Virgina Norfolk M.D.   On: 07/12/2019 15:50   DG Chest Port 1V today  Result Date: 07/06/2019 CLINICAL DATA:  Dyspnea, COVID-19 positive EXAM: PORTABLE CHEST 1 VIEW COMPARISON:  07/03/2019 chest radiograph. FINDINGS: Stable configuration of 2 lead left subclavian pacemaker. Surgical clips overlie  the central lower neck. Stable cardiomediastinal silhouette with mild cardiomegaly. No pneumothorax. No pleural effusion. Patchy hazy opacities throughout the mid to lower lungs bilaterally, similar to minimally improved. IMPRESSION: Patchy hazy opacities throughout the mid to lower lungs bilaterally,  similar to minimally improved, compatible with COVID-19 pneumonia. Stable cardiomegaly. Electronically Signed   By: Ilona Sorrel M.D.   On: 07/06/2019 13:09

## 2019-07-12 NOTE — TOC Initial Note (Signed)
Transition of Care [facility]) - Initial/Assessment Note    Patient Details  Name: [name] MRN: [id number] Date of Birth: June 21, 1956  [facility] CM/SW Contact:    Atilano Median, LCSW Phone Number: 07/12/2019, 9:30 AM  Clinical Narrative:                 Admitted with sensitive past medical history including amiodarone toxicity, interstitial lung disease, chronic systolic/diastolic heart failure, history of congenital heart disease, type 2 diabetes mellitus, IDA, hypertension,hyperparathyroidism, peripheral vascular disease, renal insufficiency, persistent A. fib anticoagulated on Xarelto patient presents to ED secondary to shortness of breath, patient was recently diagnosed 12/31 with COVID-19, at baseline she has no oxygen requirement, patient presents to ED secondary to shortness of breath, report her symptoms started on the third, with progressive dyspnea, reports fever, chills, cough, neurolyse body ache, denies nausea, vomiting, diarrhea, chest pain, leg edema, or orthopnea.  Pt recommending CIR, however patient will need 2 negative COVID test prior to being able to admit. Per MD this may not be possible, therefore patient is agreeable to SNF if needed.   TOC team to continue to follow    Expected Discharge Plan: [place] Barriers to Discharge: Continued Medical Work up   Patient Goals and CMS Choice Patient states their goals for this hospitalization and ongoing recovery are:: get/feel better CMS Medicare.gov Compare Post Acute Care list provided to:: Patient Choice offered to / list presented to : Patient  Expected Discharge Plan and Services Expected Discharge Plan: [place] In-house Referral: Clinical Social Work   Post Acute Care Choice: [place] Living arrangements for the past 2 months: Apartment                                      Prior Living Arrangements/Services Living arrangements for  the past 2 months: Apartment Lives with:: Self, Relatives(brother) Patient language and need for interpreter reviewed:: Yes        Need for Family Participation in Patient Care: Yes (Comment) Care giver support system in place?: Yes (comment)      Activities of Daily Living Home Assistive Devices/Equipment: Hearing aid ADL Screening (condition at time of admission) Patient's cognitive ability adequate to safely complete daily activities?: Yes Is the patient deaf or have difficulty hearing?: Yes Does the patient have difficulty seeing, even when wearing glasses/contacts?: No Does the patient have difficulty concentrating, remembering, or making decisions?: No Patient able to express need for assistance with ADLs?: Yes Does the patient have difficulty dressing or bathing?: No Independently performs ADLs?: Yes (appropriate for developmental age) Does the patient have difficulty walking or climbing stairs?: No Weakness of Legs: Both Weakness of Arms/Hands: Both  Permission Sought/Granted                  Emotional Assessment       Orientation: : Oriented to Self, Oriented to Place, Oriented to  Time, Oriented to Situation      Admission diagnosis:  Acute respiratory failure due to COVID-19 ([name]) [U07.1, J96.00] Acute respiratory disease due to COVID-19 virus [U07.1, J06.9] Patient Active Problem List   Diagnosis Date Noted  . AKI (acute kidney injury) ([place]) 07/03/2019  . SIRS (systemic inflammatory response syndrome) ([place]) 07/20/2019  . Acute respiratory disease due to COVID-19 virus 06/29/2019  . Asthma 03/11/2019  . Atrial fibrillation with rapid ventricular response ([place]) 06/29/2017  .  Acute bronchitis 05/24/2017  . CAD (coronary artery disease) 05/17/2017  . Elevated troponin   . Acute systolic heart failure ([place])   . Non-ST elevation (NSTEMI) myocardial infarction ([place])   . Non-ischemic cardiomyopathy ([place])   . Acute on chronic combined systolic and diastolic  CHF (congestive heart failure) ([place]) 05/10/2017  . Persistent atrial fibrillation ([place]) 05/09/2017  . LLQ pain 01/04/2017  . ILD (interstitial lung disease) ([place]) 11/21/2016  . Hemorrhoids 08/21/2016  . IDA (iron deficiency anemia) 01/11/2016  . Diverticulosis of colon without hemorrhage   . Abdominal pain, right upper quadrant 10/29/2015  . Rectal bleeding 10/29/2015  . Normocytic anemia 10/29/2015  . Dysphagia   . Abnormality of esophagus 04/29/2015  . Esophageal dysphagia 03/26/2015  . OSA (obstructive sleep apnea) 12/31/2013  . Atrial fibrillation with RVR ([place]) 11/09/2013  . CKD (chronic kidney disease), stage III 11/09/2013  . Hyperkalemia 11/07/2013  . Diastolic dysfunction [id number]  . DM (diabetes mellitus) ([place]) 11/07/2013  . Varicose veins of lower extremities with ulcer ([place]) 10/17/2013  . Cold-Left Hand / Foot 10/08/2013  . Circulation problem-Left foot 10/08/2013  . Atherosclerosis of native arteries of the extremities with ulceration(440.23) 10/08/2013  . A-fib ([place]) 08/05/2013  . Constipation 04/11/2013  . Peripheral vascular disease, unspecified ([place]) 10/27/2011  . Varicose veins of bilateral lower extremities with other complications 0000000  . COLLAGENOUS COLITIS 04/08/2010  . GERD 02/18/2010  . HYPERTHYROIDISM 02/11/2010   PCP:  Celene Squibb, MD Pharmacy:   [place], [place] [address] Phone: [phone] Fax: [place], [place] [address] Phone: [phone] Fax: [phone]     Social Determinants of Health (SDOH) Interventions    Readmission Risk Interventions No flowsheet data found.

## 2019-07-13 LAB — GLUCOSE, CAPILLARY
Glucose-Capillary: 140 mg/dL — ABNORMAL HIGH (ref 70–99)
Glucose-Capillary: 167 mg/dL — ABNORMAL HIGH (ref 70–99)
Glucose-Capillary: 215 mg/dL — ABNORMAL HIGH (ref 70–99)
Glucose-Capillary: 232 mg/dL — ABNORMAL HIGH (ref 70–99)
Glucose-Capillary: 303 mg/dL — ABNORMAL HIGH (ref 70–99)

## 2019-07-13 LAB — CBC
HCT: 45 % (ref 36.0–46.0)
Hemoglobin: 14.5 g/dL (ref 12.0–15.0)
MCH: 31 pg (ref 26.0–34.0)
MCHC: 32.2 g/dL (ref 30.0–36.0)
MCV: 96.2 fL (ref 80.0–100.0)
Platelets: 202 10*3/uL (ref 150–400)
RBC: 4.68 MIL/uL (ref 3.87–5.11)
RDW: 14.3 % (ref 11.5–15.5)
WBC: 17.2 10*3/uL — ABNORMAL HIGH (ref 4.0–10.5)
nRBC: 0.1 % (ref 0.0–0.2)

## 2019-07-13 LAB — COMPREHENSIVE METABOLIC PANEL
ALT: 25 U/L (ref 0–44)
AST: 27 U/L (ref 15–41)
Albumin: 2.4 g/dL — ABNORMAL LOW (ref 3.5–5.0)
Alkaline Phosphatase: 144 U/L — ABNORMAL HIGH (ref 38–126)
Anion gap: 12 (ref 5–15)
BUN: 56 mg/dL — ABNORMAL HIGH (ref 8–23)
CO2: 23 mmol/L (ref 22–32)
Calcium: 8.8 mg/dL — ABNORMAL LOW (ref 8.9–10.3)
Chloride: 106 mmol/L (ref 98–111)
Creatinine, Ser: 1.34 mg/dL — ABNORMAL HIGH (ref 0.44–1.00)
GFR calc Af Amer: 49 mL/min — ABNORMAL LOW (ref >60)
GFR calc non Af Amer: 42 mL/min — ABNORMAL LOW (ref >60)
Glucose, Bld: 128 mg/dL — ABNORMAL HIGH (ref 70–99)
Potassium: 4.5 mmol/L (ref 3.5–5.1)
Sodium: 141 mmol/L (ref 135–145)
Total Bilirubin: 1.4 mg/dL — ABNORMAL HIGH (ref 0.3–1.2)
Total Protein: 6 g/dL — ABNORMAL LOW (ref 6.5–8.1)

## 2019-07-13 LAB — C-REACTIVE PROTEIN: CRP: 0.9 mg/dL (ref <1.0)

## 2019-07-13 LAB — PROCALCITONIN: Procalcitonin: 0.12 ng/mL

## 2019-07-13 LAB — FERRITIN: Ferritin: 764 ng/mL — ABNORMAL HIGH (ref 11–307)

## 2019-07-13 LAB — D-DIMER, QUANTITATIVE: D-Dimer, Quant: 2.31 ug/mL-FEU — ABNORMAL HIGH (ref 0.00–0.50)

## 2019-07-13 LAB — BRAIN NATRIURETIC PEPTIDE: B Natriuretic Peptide: 453.4 pg/mL — ABNORMAL HIGH (ref 0.0–100.0)

## 2019-07-13 MED ORDER — TRAMADOL HCL 50 MG PO TABS
25.0000 mg | ORAL_TABLET | Freq: Once | ORAL | Status: AC
  Administered 2019-07-13: 25 mg via ORAL
  Filled 2019-07-13: qty 1

## 2019-07-13 MED ORDER — MELATONIN 3 MG PO TABS
6.0000 mg | ORAL_TABLET | Freq: Every evening | ORAL | Status: DC | PRN
  Administered 2019-07-13 – 2019-07-23 (×4): 6 mg via ORAL
  Filled 2019-07-13 (×8): qty 2

## 2019-07-13 MED ORDER — FUROSEMIDE 10 MG/ML IJ SOLN
40.0000 mg | Freq: Once | INTRAMUSCULAR | Status: AC
  Administered 2019-07-13: 13:00:00 40 mg via INTRAVENOUS
  Filled 2019-07-13: qty 4

## 2019-07-13 NOTE — Progress Notes (Signed)
PROGRESS NOTE                                                                                                                                                                                                             Patient Demographics:    [name], is a 63 y.o. female, DOB - 28-Feb-1956, [id number]  Outpatient Primary MD for the patient is Celene Squibb, MD   Admit date - 07/15/2019   LOS - 68  Chief Complaint  Patient presents with  . Shortness of Breath  . covid pos       Brief Narrative:  Patient is a 64 y.o. 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    Post admission-she had significant worsening of her hypoxemia-requiring 100% NRB-she was then transferred to [facility].  Hypoxia gradually improved with steroids/remdesivir and empiric antibiotics-however on 1/12-1/13-hypoxia briefly worsened-chest x-ray showed a new left-sided infiltrate.  Hospital course was also complicated by development of AKI and hypernatremia.  Patient was provided supportive care with IV fluids, and was restarted on IV antibiotics (Unasyn)-with further improvement of hypoxemia.  See below for further details.    COVID-19 Medications:  Steroids: 1/6>> 1/15 Remdesivir: 1/6>>1/10 Convalescent Plasma: 1/7   Antibiotics: Unasyn: 1/14>> Cefepime: 1/6>>1/12 Vancomycin: 1/6>>1/8   Subjective:   Patient in bed, appears comfortable, denies any headache, no fever, no chest pain or pressure, improved shortness of breath , no abdominal  pain. No focal weakness.   Assessment  & Plan :   Acute Hypoxic Resp Failure due to Covid 19 Viral pneumonia and concurrent bacterial pneumonia: she had severe COVID-19 pneumonia along with bacterial pneumonia, she has been treated so far with IV steroids, remdesivir and convalescent plasma for her COVID-19 infection along with vancomycin, cefepime and subsequently Unasyn for the bacterial pneumonia.  She is gradually improving.  Continue to titrate down steroids and oxygen.  Advance activity.  Encouraged to sit up in chair in the daytime use I-S and flutter valve for pulmonary toiletry and prone in  bed at night if possible.  Also involve speech therapy to rule out ongoing aspiration.    O2 requirements:  SpO2: 97 % O2 Flow Rate (L/min): 5 L/min   COVID-19 Labs: Recent Labs    07/11/19 0239 07/12/19 0150 07/13/19 0145  DDIMER 2.89* 2.12* 2.31*  FERRITIN 1,105* 1,091* 764*  CRP 2.0* 1.3* 0.9       Component Value Date/Time   BNP 453.4 (H) 07/13/2019 0807    Recent Labs  Lab 07/07/19 1019 07/08/19 0335  PROCALCITON 0.61 0.28    Lab Results  Component Value Date   SARSCOV2NAA Detected (A) 06/26/2019   [place] NEGATIVE 02/20/2019   [place] Not Detected 12/18/2018    Prone/Incentive Spirometry: encouraged  incentive spirometry use 3-4/hour.   AKI on CKD stage IIIa: Likely due to fluid overload gently diurese and monitor.  Acute metabolic encephalopathy: Does not appear to be confused-she is hard of hearing-and is following commands.  Suspect mild metabolic encephalopathy was from hypoxemia, hyponatremia and AKI.    HTN: BP controlled-continue metoprolol  Persistent atrial fibrillation s/p AV node ablation and PPM insertion: Continue metoprolol-on Xarelto.  Rate under good control.  Paced rhythm on telemetry.  Note has history of amiodarone induced lung toxicity  Chronic combined systolic and diastolic heart failure percent on echocardiogram in 02/2019: Dense of  mild fluid overload, gentle Lasix on 07/13/2019.  Bronchial asthma: Appears stable-no rhonchi-continue bronchodilators.  DM-2 (A1c 6.8): Has had hypoglycemic episodes a few days back-CBGs now trending back up-patient's appetite is seems to be slowly improving.  Start Lantus 10 units nightly-continue SSI and follow.  CBG (last 3)  Recent Labs    07/12/19 2041 07/13/19 0026 07/13/19 0726  GLUCAP 251* 140* 167*   Hypothyroidism: Continue levothyroxine  History of amiodarone induced lung toxicity/ILD: Supportive care.  Remote history of ASD repair  Deconditioning/debility: Appears very weak from acute illness-plans are for SNF when she is closer to discharge.  Consults  :  PCCM  Procedures  :  None  Condition - stable  Family Communication  :  Previous MD Brother-updated over the phone on 1/16  Code Status :  Full Code  Diet :  Diet Order            Diet heart healthy/carb modified Room service appropriate? Yes; Fluid consistency: Thin  Diet effective now               Disposition Plan  :  SNF.  Barriers to discharge: Hypoxia requiring O2 supplementation  Antimicorbials  :    Anti-infectives (From admission, onward)   Start     Dose/Rate Route Frequency Ordered Stop   07/10/19 1000  ampicillin-sulbactam (UNASYN) 1.5 g in sodium chloride 0.9 % 100 mL IVPB     1.5 g 200 mL/hr over 30 Minutes Intravenous Every 8 hours 07/10/19 0945     07/04/19 1830  vancomycin (VANCOREADY) IVPB 750 mg/150 mL  Status:  Discontinued     750 mg 150 mL/hr over 60 Minutes Intravenous Every 48 hours 07/03/19 1249 07/04/19 1110   07/04/19 1800  vancomycin (VANCOCIN) IVPB 1000 mg/200 mL premix  Status:  Discontinued     1,000 mg 200 mL/hr over 60 Minutes Intravenous Every 48 hours 07/03/2019 1727 07/03/19 1249   07/03/19 1800  ceFEPIme (MAXIPIME) 1 g in sodium chloride 0.9 % 100 mL IVPB  Status:  Discontinued     1 g 200 mL/hr over 30 Minutes Intravenous Every 24 hours 07/15/2019 1725  07/03/19 1248  07/03/19 1800  ceFEPIme (MAXIPIME) 2 g in sodium chloride 0.9 % 100 mL IVPB     2 g 200 mL/hr over 30 Minutes Intravenous Every 24 hours 07/03/19 1247 07/08/19 1850   07/03/19 1000  remdesivir 100 mg in sodium chloride 0.9 % 100 mL IVPB     100 mg 200 mL/hr over 30 Minutes Intravenous Daily 07/03/2019 1723 07/06/19 0926   07/03/19 1000  remdesivir 100 mg in sodium chloride 0.9 % 100 mL IVPB  Status:  Discontinued     100 mg 200 mL/hr over 30 Minutes Intravenous Daily 07/24/2019 2355 07/03/19 0257   07/03/2019 2354  remdesivir 200 mg in sodium chloride 0.9% 250 mL IVPB  Status:  Discontinued     200 mg 580 mL/hr over 30 Minutes Intravenous Once 07/14/2019 2355 07/03/19 0257   07/17/2019 1800  remdesivir 200 mg in sodium chloride 0.9% 250 mL IVPB     200 mg 580 mL/hr over 30 Minutes Intravenous Once 07/09/2019 1723 07/01/2019 2306   07/27/2019 1730  vancomycin (VANCOCIN) IVPB 1000 mg/200 mL premix  Status:  Discontinued     1,000 mg 200 mL/hr over 60 Minutes Intravenous  Once 07/21/2019 1722 07/01/2019 1725   07/05/2019 1730  ceFEPIme (MAXIPIME) 2 g in sodium chloride 0.9 % 100 mL IVPB     2 g 200 mL/hr over 30 Minutes Intravenous  Once 07/04/2019 1722 07/24/2019 1918   07/16/2019 1730  vancomycin (VANCOREADY) IVPB 1500 mg/300 mL     1,500 mg 150 mL/hr over 120 Minutes Intravenous  Once 07/12/2019 1725 06/28/2019 2036      DVT Prophylaxis  : Xarelto  Inpatient Medications  Scheduled Meds: . sodium chloride   Intravenous Once  . vitamin C  500 mg Oral Daily  . fluticasone furoate-vilanterol  2 puff Inhalation Daily  . insulin aspart  0-9 Units Subcutaneous TID WC  . levothyroxine  100 mcg Oral QAC breakfast  . metoprolol succinate  50 mg Oral Daily  . pantoprazole  40 mg Oral BID  . pravastatin  10 mg Oral Daily  . Rivaroxaban  15 mg Oral Q breakfast  . sodium zirconium cyclosilicate  10 g Oral Daily  . zinc sulfate  220 mg Oral Daily   Continuous Infusions: . ampicillin-sulbactam (UNASYN)  IV 1.5 g (07/13/19 1009)   PRN Meds:.acetaminophen, albuterol, chlorpheniramine-HYDROcodone, guaiFENesin-dextromethorphan, lip balm, Melatonin, senna-docusate   Time Spent in minutes 25  See all Orders from today for further details   Lala Lund M.D on 07/13/2019 at 11:53 AM  To page go to [email or web address] - use universal password  [facility] -  Office  [phone]    Objective:   Vitals:   07/12/19 2300 07/13/19 0500 07/13/19 0727 07/13/19 1150  BP:  122/79 135/85 127/83  Pulse: 70 76 76 74  Resp: 18 (!) 26 (!) 24 (!) 25  Temp:  97.7 F (36.5 C) 97.9 F (36.6 C) (!) 97.3 F (36.3 C)  TempSrc:  Oral Oral Axillary  SpO2: 99% 98% 99% 97%  Weight:      Height:        Wt Readings from Last 3 Encounters:  07/14/2019 59.9 kg  04/01/19 65.8 kg  04/01/19 64.4 kg     Intake/Output Summary (Last 24 hours) at 07/13/2019 1153 Last data filed at 07/13/2019 1100 Gross per 24 hour  Intake 1161.83 ml  Output 300 ml  Net 861.83 ml     Physical Exam  Awake Alert,  No new F.N deficits, Normal affect  [place].AT,PERRAL Supple Neck,No JVD, No cervical lymphadenopathy appriciated.  Symmetrical Chest wall movement, Good air movement bilaterally, CTAB RRR,No Gallops, Rubs or new Murmurs, No Parasternal Heave +ve B.Sounds, Abd Soft, No tenderness, No organomegaly appriciated, No rebound - guarding or rigidity. No Cyanosis, Clubbing or edema, No new Rash or bruise   Data Review:    CBC Recent Labs  Lab 07/07/19 0352 07/08/19 0335 07/09/19 0513 07/10/19 0358 07/11/19 0239 07/12/19 0150 07/13/19 0145  WBC 16.8*   < > 17.0* 17.9* 20.1* 16.5* 17.2*  HGB 16.1*   < > 14.9 14.6 15.1* 14.4 14.5  HCT 49.0*   < > 45.5 44.7 47.4* 45.8 45.0  PLT 255   < > 250 209 225 199 202  MCV 95.1   < > 94.6 95.9 97.3 97.2 96.2  MCH 31.3   < > 31.0 31.3 31.0 30.6 31.0  MCHC 32.9   < > 32.7 32.7 31.9 31.4 32.2  RDW 13.9   < > 14.2 14.4 14.5 14.3 14.3  LYMPHSABS 0.7  --   --   --   --   --    --   MONOABS 0.6  --   --   --   --   --   --   EOSABS 0.0  --   --   --   --   --   --   BASOSABS 0.1  --   --   --   --   --   --    < > = values in this interval not displayed.    Chemistries  Recent Labs  Lab 07/09/19 0513 07/09/19 0513 07/09/19 1700 07/10/19 0358 07/11/19 0239 07/12/19 0150 07/13/19 0145  NA 147*   < > 149* 142 144 139 141  K 5.7*   < > 4.1 4.2 4.4 4.4 4.5  CL 118*   < > 113* 112* 111 107 106  CO2 18*   < > 22 21* 22 22 23   GLUCOSE 177*   < > 151* 223* 141* 137* 128*  BUN 84*   < > 78* 62* 52* 49* 56*  CREATININE 1.76*   < > 1.72* 1.43* 1.26* 1.24* 1.34*  CALCIUM 8.8*   < > 9.2 8.8* 9.2 8.8* 8.8*  AST 33  --   --  28 30 22 27   ALT 23  --   --  22 22 21 25   ALKPHOS 148*  --   --  126 141* 127* 144*  BILITOT 1.6*  --   --  1.6* 1.7* 1.1 1.4*   < > = values in this interval not displayed.   ------------------------------------------------------------------------------------------------------------------ No results for input(s): CHOL, HDL, LDLCALC, TRIG, CHOLHDL, LDLDIRECT in the last 72 hours.  Lab Results  Component Value Date   HGBA1C 6.8 (H) 07/06/2019   ------------------------------------------------------------------------------------------------------------------ No results for input(s): TSH, T4TOTAL, T3FREE, THYROIDAB in the last 72 hours.  Invalid input(s): FREET3 ------------------------------------------------------------------------------------------------------------------ Recent Labs    07/12/19 0150 07/13/19 0145  FERRITIN 1,091* 764*    Coagulation profile No results for input(s): INR, PROTIME in the last 168 hours.  Recent Labs    07/12/19 0150 07/13/19 0145  DDIMER 2.12* 2.31*    Cardiac Enzymes No results for input(s): CKMB, TROPONINI, MYOGLOBIN in the last 168 hours.  Invalid input(s): CK ------------------------------------------------------------------------------------------------------------------      Component Value Date/Time   BNP 453.4 (H) 07/13/2019 [id number]    Micro Results No results found for this or any previous visit (from the  past 240 hour(s)).  Radiology Reports DG CHEST PORT 1 VIEW  Result Date: 07/09/2019 CLINICAL DATA:  Shortness of breath EXAM: PORTABLE CHEST 1 VIEW COMPARISON:  07/06/2019 FINDINGS: Patchy bilateral airspace disease again noted, worsening since prior study, particularly in the left lower lobe. Cardiomegaly. Pacer remains in place, unchanged. No effusions or pneumothorax. No acute bony abnormality. IMPRESSION: Patchy bilateral airspace opacities, worsening since prior study, particularly in the left lower lobe. Electronically Signed   By: Rolm Baptise M.D.   On: 07/09/2019 21:47   DG Chest Port 1 View  Result Date: 07/03/2019 CLINICAL DATA:  COVID-19 positive.  Worsening shortness of breath. EXAM: PORTABLE CHEST 1 VIEW COMPARISON:  07/09/2019. FINDINGS: Surgical clips noted over the neck. AICD in stable position. Stable cardiomegaly. Diffuse severe bilateral pulmonary infiltrates again noted. Slight improvement in aeration on today's exam. No pleural effusion pneumothorax. Old infarct proximal right humerus. No acute bony abnormality. IMPRESSION: 1.  AICD in stable position.  Stable cardiomegaly. 2. Diffuse severe bilateral pulmonary infiltrates again noted. Slight improvement in aeration on today's exam. Electronically Signed   By: Marcello Moores  Register   On: 07/03/2019 07:15   DG Chest Port 1 View  Result Date: 07/01/2019 CLINICAL DATA:  Hypoxia. EXAM: PORTABLE CHEST 1 VIEW COMPARISON:  None. FINDINGS: There is a dual lead AICD. Moderate severity diffuse bilateral infiltrates are seen. There is a small right pleural effusion. No pneumothorax is identified. The cardiac silhouette is markedly enlarged. Multiple radiopaque surgical clips are seen overlying the superior mediastinum. There is marked severity levoscoliosis of the thoracic spine with moderate severity multilevel  degenerative changes. IMPRESSION: 1. Moderate severity diffuse bilateral infiltrates with a small right pleural effusion. 2. Marked enlargement of the cardiac silhouette. 3. Dual lead AICD in place. Electronically Signed   By: Virgina Norfolk M.D.   On: 07/12/2019 15:50   DG Chest Port 1V today  Result Date: 07/06/2019 CLINICAL DATA:  Dyspnea, COVID-19 positive EXAM: PORTABLE CHEST 1 VIEW COMPARISON:  07/03/2019 chest radiograph. FINDINGS: Stable configuration of 2 lead left subclavian pacemaker. Surgical clips overlie the central lower neck. Stable cardiomediastinal silhouette with mild cardiomegaly. No pneumothorax. No pleural effusion. Patchy hazy opacities throughout the mid to lower lungs bilaterally, similar to minimally improved. IMPRESSION: Patchy hazy opacities throughout the mid to lower lungs bilaterally, similar to minimally improved, compatible with COVID-19 pneumonia. Stable cardiomegaly. Electronically Signed   By: Ilona Sorrel M.D.   On: 07/06/2019 13:09

## 2019-07-13 NOTE — Plan of Care (Signed)
RN updated pt's brother, Eddie Dibbles, via phone. Pt still requiring 3L HFNC to 15L NRB. RN attempted to ambulate pt in room. O2 sat in the 60's on 3L [place] and pt reported feeling lightheaded. RN moved chair to pt and placed on 15L NRB. Sats improved to 90s within a minute. Pt currently up in chair on 5L HFNC with sats in the low 90s. Will continue current POC.  Problem: Education: Goal: Knowledge of General Education information will improve Description: Including pain rating scale, medication(s)/side effects and non-pharmacologic comfort measures Outcome: Progressing   Problem: Health Behavior/Discharge Planning: Goal: Ability to manage health-related needs will improve Outcome: Progressing   Problem: Clinical Measurements: Goal: Ability to maintain clinical measurements within normal limits will improve Outcome: Progressing Goal: Will remain free from infection Outcome: Progressing Goal: Diagnostic test results will improve Outcome: Progressing Goal: Respiratory complications will improve Outcome: Progressing Goal: Cardiovascular complication will be avoided Outcome: Progressing   Problem: Activity: Goal: Risk for activity intolerance will decrease Outcome: Progressing   Problem: Nutrition: Goal: Adequate nutrition will be maintained Outcome: Progressing   Problem: Coping: Goal: Level of anxiety will decrease Outcome: Progressing   Problem: Elimination: Goal: Will not experience complications related to bowel motility Outcome: Progressing Goal: Will not experience complications related to urinary retention Outcome: Progressing   Problem: Pain Managment: Goal: General experience of comfort will improve Outcome: Progressing   Problem: Safety: Goal: Ability to remain free from injury will improve Outcome: Progressing  Problem: Skin Integrity: Goal: Risk for impaired skin integrity will decrease Outcome: Progressing   Problem: Education: Goal: Knowledge of risk factors and  measures for prevention of condition will improve Outcome: Progressing   Problem: Coping: Goal: Psychosocial and spiritual needs will be supported Outcome: Progressing   Problem: Respiratory: Goal: Will maintain a patent airway Outcome: Progressing Goal: Complications related to the disease process, condition or treatment will be avoided or minimized Outcome: Progressing

## 2019-07-14 LAB — CBC WITH DIFFERENTIAL/PLATELET
Abs Immature Granulocytes: 0.74 10*3/uL — ABNORMAL HIGH (ref 0.00–0.07)
Basophils Absolute: 0.1 10*3/uL (ref 0.0–0.1)
Basophils Relative: 1 %
Eosinophils Absolute: 0.2 10*3/uL (ref 0.0–0.5)
Eosinophils Relative: 1 %
HCT: 45.4 % (ref 36.0–46.0)
Hemoglobin: 14.6 g/dL (ref 12.0–15.0)
Immature Granulocytes: 5 %
Lymphocytes Relative: 9 %
Lymphs Abs: 1.4 10*3/uL (ref 0.7–4.0)
MCH: 31 pg (ref 26.0–34.0)
MCHC: 32.2 g/dL (ref 30.0–36.0)
MCV: 96.4 fL (ref 80.0–100.0)
Monocytes Absolute: 1 10*3/uL (ref 0.1–1.0)
Monocytes Relative: 6 %
Neutro Abs: 12.8 10*3/uL — ABNORMAL HIGH (ref 1.7–7.7)
Neutrophils Relative %: 78 %
Platelets: 192 10*3/uL (ref 150–400)
RBC: 4.71 MIL/uL (ref 3.87–5.11)
RDW: 14 % (ref 11.5–15.5)
WBC: 16.3 10*3/uL — ABNORMAL HIGH (ref 4.0–10.5)
nRBC: 0 % (ref 0.0–0.2)

## 2019-07-14 LAB — GLUCOSE, CAPILLARY
Glucose-Capillary: 155 mg/dL — ABNORMAL HIGH (ref 70–99)
Glucose-Capillary: 263 mg/dL — ABNORMAL HIGH (ref 70–99)
Glucose-Capillary: 273 mg/dL — ABNORMAL HIGH (ref 70–99)
Glucose-Capillary: 146 mg/dL — ABNORMAL HIGH (ref 70–99)

## 2019-07-14 LAB — COMPREHENSIVE METABOLIC PANEL
ALT: 55 U/L — ABNORMAL HIGH (ref 0–44)
AST: 45 U/L — ABNORMAL HIGH (ref 15–41)
Albumin: 2.4 g/dL — ABNORMAL LOW (ref 3.5–5.0)
Alkaline Phosphatase: 197 U/L — ABNORMAL HIGH (ref 38–126)
Anion gap: 12 (ref 5–15)
BUN: 55 mg/dL — ABNORMAL HIGH (ref 8–23)
CO2: 24 mmol/L (ref 22–32)
Calcium: 8.7 mg/dL — ABNORMAL LOW (ref 8.9–10.3)
Chloride: 102 mmol/L (ref 98–111)
Creatinine, Ser: 1.36 mg/dL — ABNORMAL HIGH (ref 0.44–1.00)
GFR calc Af Amer: 48 mL/min — ABNORMAL LOW (ref >60)
GFR calc non Af Amer: 41 mL/min — ABNORMAL LOW (ref >60)
Glucose, Bld: 137 mg/dL — ABNORMAL HIGH (ref 70–99)
Potassium: 4.4 mmol/L (ref 3.5–5.1)
Sodium: 138 mmol/L (ref 135–145)
Total Bilirubin: 1.2 mg/dL (ref 0.3–1.2)
Total Protein: 6 g/dL — ABNORMAL LOW (ref 6.5–8.1)

## 2019-07-14 LAB — D-DIMER, QUANTITATIVE: D-Dimer, Quant: 2.07 ug/mL-FEU — ABNORMAL HIGH (ref 0.00–0.50)

## 2019-07-14 LAB — BRAIN NATRIURETIC PEPTIDE: B Natriuretic Peptide: 506.5 pg/mL — ABNORMAL HIGH (ref 0.0–100.0)

## 2019-07-14 LAB — C-REACTIVE PROTEIN: CRP: 1 mg/dL — ABNORMAL HIGH (ref <1.0)

## 2019-07-14 LAB — MAGNESIUM: Magnesium: 1.8 mg/dL (ref 1.7–2.4)

## 2019-07-14 LAB — PROCALCITONIN: Procalcitonin: 0.14 ng/mL

## 2019-07-14 MED ORDER — FUROSEMIDE 10 MG/ML IJ SOLN
40.0000 mg | Freq: Once | INTRAMUSCULAR | Status: AC
  Administered 2019-07-14: 14:00:00 40 mg via INTRAVENOUS
  Filled 2019-07-14: qty 4

## 2019-07-14 MED ORDER — FUROSEMIDE 10 MG/ML IJ SOLN
40.0000 mg | Freq: Once | INTRAMUSCULAR | Status: AC
  Administered 2019-07-14: 08:00:00 40 mg via INTRAVENOUS
  Filled 2019-07-14: qty 4

## 2019-07-14 MED ORDER — MAGNESIUM SULFATE IN D5W 1-5 GM/100ML-% IV SOLN
1.0000 g | Freq: Once | INTRAVENOUS | Status: AC
  Administered 2019-07-14: 1 g via INTRAVENOUS
  Filled 2019-07-14: qty 100

## 2019-07-14 NOTE — Progress Notes (Signed)
Inpatient Rehabilitation Admissions Coordinator  Inpatient rehab consult received. I await updated therapy treatment to assist with planning dispo and to obtain insurance approval for a possible inpt rehab admit when medically ready.  Danne Baxter, RN, MSN Rehab Admissions Coordinator [phone] 07/14/2019 3:47 PM

## 2019-07-14 NOTE — Plan of Care (Signed)
Pt up in chair tolerating 4-6L [place] with 15L NRB on occasion. IV Lasix and OT IV mag given. Appetite remains poor but pt had Ensure supplements x2. Good UOP. VSS. Will continue current POC.  Problem: Education: Goal: Knowledge of General Education information will improve Description: Including pain rating scale, medication(s)/side effects and non-pharmacologic comfort measures Outcome: Progressing   Problem: Health Behavior/Discharge Planning: Goal: Ability to manage health-related needs will improve Outcome: Progressing   Problem: Clinical Measurements: Goal: Ability to maintain clinical measurements within normal limits will improve Outcome: Progressing Goal: Will remain free from infection Outcome: Progressing Goal: Diagnostic test results will improve Outcome: Progressing Goal: Respiratory complications will improve Outcome: Progressing Goal: Cardiovascular complication will be avoided Outcome: Progressing   Problem: Activity: Goal: Risk for activity intolerance will decrease Outcome: Progressing   Problem: Nutrition: Goal: Adequate nutrition will be maintained Outcome: Progressing   Problem: Coping: Goal: Level of anxiety will decrease Outcome: Progressing   Problem: Elimination: Goal: Will not experience complications related to bowel motility Outcome: Progressing Goal: Will not experience complications related to urinary retention Outcome: Progressing   Problem: Pain Managment: Goal: General experience of comfort will improve Outcome: Progressing   Problem: Safety: Goal: Ability to remain free from injury will improve Outcome: Progressing   Problem: Skin Integrity: Goal: Risk for impaired skin integrity will decrease Outcome: Progressing   Problem: Education: Goal: Knowledge of risk factors and measures for prevention of condition will improve Outcome: Progressing   Problem: Coping: Goal: Psychosocial and spiritual needs will be supported Outcome:  Progressing   Problem: Respiratory: Goal: Will maintain a patent airway Outcome: Progressing Goal: Complications related to the disease process, condition or treatment will be avoided or minimized Outcome: Progressing

## 2019-07-14 NOTE — Progress Notes (Signed)
PROGRESS NOTE                                                                                                                                                                                                             Patient Demographics:    [name], is a 64 y.o. female, DOB - June 29, 1955, [id number]  Outpatient Primary MD for the patient is Celene Squibb, MD   Admit date - 06/28/2019   LOS - 12  Chief Complaint  Patient presents with  . Shortness of Breath  . covid pos       Brief Narrative:  Patient is a 64 y.o. 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    Post admission-she had significant worsening of her hypoxemia-requiring 100% NRB-she was then transferred to [facility].  Hypoxia gradually improved with steroids/remdesivir and empiric antibiotics-however on 1/12-1/13-hypoxia briefly worsened-chest x-ray showed a new left-sided infiltrate.  Hospital course was also complicated by development of AKI and hypernatremia.  Patient was provided supportive care with IV fluids, and was restarted on IV antibiotics (Unasyn)-with further improvement of hypoxemia.  See below for further details.    COVID-19 Medications:  Steroids: 1/6>> 1/15 Remdesivir: 1/6>>1/10 Convalescent Plasma: 1/7   Antibiotics: Unasyn: 1/14>> Cefepime: 1/6>>1/12 Vancomycin: 1/6>>1/8   Subjective:   Patient in recliner denies any headache chest or abdominal pain, was slightly anxious and experiencing some shortness of breath on  activity.   Assessment  & Plan :   Acute Hypoxic Resp Failure due to Covid 19 Viral pneumonia and concurrent bacterial pneumonia: she had severe COVID-19 pneumonia along with bacterial pneumonia, she has been treated so far with IV steroids, remdesivir and convalescent plasma for her COVID-19 infection along with vancomycin, cefepime and subsequently Unasyn for the bacterial pneumonia.  She is gradually improving.  On exam on 07/13/2018 when she has developed significant rails for which she will get a challenge of IV Lasix.   Continue to titrate down steroids and oxygen.  Advance activity.  Encouraged to sit  up in chair in the daytime use I-S and flutter valve for pulmonary toiletry and prone in bed at night if possible.  Also involve speech therapy to rule out ongoing aspiration.    O2 requirements:  SpO2: 97 % O2 Flow Rate (L/min): 5 L/min   COVID-19 Labs: Recent Labs    07/12/19 0150 07/13/19 0145 07/14/19 0138  DDIMER 2.12* 2.31* 2.07*  FERRITIN 1,091* 764*  --   CRP 1.3* 0.9 1.0*       Component Value Date/Time   BNP 506.5 (H) 07/14/2019 0138    Recent Labs  Lab 07/08/19 0335 07/13/19 0500 07/14/19 0138  PROCALCITON 0.28 0.12 0.14    Lab Results  Component Value Date   SARSCOV2NAA Detected (A) 06/26/2019   [place] NEGATIVE 02/20/2019   [place] Not Detected 12/18/2018    Prone/Incentive Spirometry: encouraged  incentive spirometry use 3-4/hour.   AKI on CKD stage IIIa: Likely due to fluid overload gently diurese and monitor.  Acute metabolic encephalopathy: Does not appear to be confused-she is hard of hearing-and is following commands.  Suspect mild metabolic encephalopathy was from hypoxemia, hyponatremia and AKI.    HTN: BP controlled-continue metoprolol  Persistent atrial fibrillation s/p AV node ablation and PPM insertion: Continue metoprolol-on Xarelto.  Rate under good control.  Paced rhythm on telemetry.  Note has history of amiodarone induced  lung toxicity  Chronic combined systolic and diastolic heart failure percent on echocardiogram in 02/2019: Dense of mild fluid overload, gentle Lasix on 07/13/2019.  Bronchial asthma: Appears stable-no rhonchi-continue bronchodilators.  DM-2 (A1c 6.8): Has had hypoglycemic episodes a few days back-CBGs now trending back up-patient's appetite is seems to be slowly improving.  Start Lantus 10 units nightly-continue SSI and follow.  CBG (last 3)  Recent Labs    07/13/19 1553 07/13/19 2045 07/14/19 0731  GLUCAP 303* 215* 155*   Hypothyroidism: Continue levothyroxine  History of amiodarone induced lung toxicity/ILD: Supportive care.  Remote history of ASD repair  Deconditioning/debility: Appears very weak from acute illness-plans are for SNF when she is closer to discharge.  Consults  :  PCCM  Procedures  :  None  Condition - stable  Family Communication  :  Previous MD Brother-updated over the phone on 1/16  Code Status :  Full Code  Diet :  Diet Order            Diet heart healthy/carb modified Room service appropriate? Yes; Fluid consistency: Thin  Diet effective now               Disposition Plan  :  SNF.  Barriers to discharge: Hypoxia requiring O2 supplementation  Antimicorbials  :    Anti-infectives (From admission, onward)   Start     Dose/Rate Route Frequency Ordered Stop   07/10/19 1000  ampicillin-sulbactam (UNASYN) 1.5 g in sodium chloride 0.9 % 100 mL IVPB     1.5 g 200 mL/hr over 30 Minutes Intravenous Every 8 hours 07/10/19 0945     07/04/19 1830  vancomycin (VANCOREADY) IVPB 750 mg/150 mL  Status:  Discontinued     750 mg 150 mL/hr over 60 Minutes Intravenous Every 48 hours 07/03/19 1249 07/04/19 1110   07/04/19 1800  vancomycin (VANCOCIN) IVPB 1000 mg/200 mL premix  Status:  Discontinued     1,000 mg 200 mL/hr over 60 Minutes Intravenous Every 48 hours 07/12/2019 1727 07/03/19 1249   07/03/19 1800  ceFEPIme (MAXIPIME) 1 g in sodium chloride 0.9 %  100 mL IVPB  Status:  Discontinued     1 g 200 mL/hr over 30 Minutes Intravenous Every 24 hours 06/27/2019 1725 07/03/19 1248   07/03/19 1800  ceFEPIme (MAXIPIME) 2 g in sodium chloride 0.9 % 100 mL IVPB     2 g 200 mL/hr over 30 Minutes Intravenous Every 24 hours 07/03/19 1247 07/08/19 1850   07/03/19 1000  remdesivir 100 mg in sodium chloride 0.9 % 100 mL IVPB     100 mg 200 mL/hr over 30 Minutes Intravenous Daily 07/04/2019 1723 07/06/19 0926   07/03/19 1000  remdesivir 100 mg in sodium chloride 0.9 % 100 mL IVPB  Status:  Discontinued     100 mg 200 mL/hr over 30 Minutes Intravenous Daily 07/10/2019 2355 07/03/19 0257   07/22/2019 2354  remdesivir 200 mg in sodium chloride 0.9% 250 mL IVPB  Status:  Discontinued     200 mg 580 mL/hr over 30 Minutes Intravenous Once 07/22/2019 2355 07/03/19 0257   06/27/2019 1800  remdesivir 200 mg in sodium chloride 0.9% 250 mL IVPB     200 mg 580 mL/hr over 30 Minutes Intravenous Once 07/23/2019 1723 07/04/2019 2306   07/16/2019 1730  vancomycin (VANCOCIN) IVPB 1000 mg/200 mL premix  Status:  Discontinued     1,000 mg 200 mL/hr over 60 Minutes Intravenous  Once 07/19/2019 1722 06/29/2019 1725   07/11/2019 1730  ceFEPIme (MAXIPIME) 2 g in sodium chloride 0.9 % 100 mL IVPB     2 g 200 mL/hr over 30 Minutes Intravenous  Once 07/27/2019 1722 07/04/2019 1918   07/01/2019 1730  vancomycin (VANCOREADY) IVPB 1500 mg/300 mL     1,500 mg 150 mL/hr over 120 Minutes Intravenous  Once 07/13/2019 1725 07/10/2019 2036      DVT Prophylaxis  : Xarelto  Inpatient Medications  Scheduled Meds: . sodium chloride   Intravenous Once  . vitamin C  500 mg Oral Daily  . fluticasone furoate-vilanterol  2 puff Inhalation Daily  . furosemide  40 mg Intravenous Once  . insulin aspart  0-9 Units Subcutaneous TID WC  . levothyroxine  100 mcg Oral QAC breakfast  . metoprolol succinate  50 mg Oral Daily  . pantoprazole  40 mg Oral BID  . pravastatin  10 mg Oral Daily  . Rivaroxaban  15 mg Oral Q  breakfast  . sodium zirconium cyclosilicate  10 g Oral Daily  . zinc sulfate  220 mg Oral Daily   Continuous Infusions: . ampicillin-sulbactam (UNASYN) IV 1.5 g (07/14/19 0939)  . magnesium sulfate bolus IVPB     PRN Meds:.acetaminophen, albuterol, chlorpheniramine-HYDROcodone, guaiFENesin-dextromethorphan, lip balm, Melatonin, senna-docusate   Time Spent in minutes 25  See all Orders from today for further details   Lala Lund M.D on 07/14/2019 at 11:03 AM  To page go to [email or web address] - use universal password  [facility] -  Office  [phone]    Objective:   Vitals:   07/13/19 1555 07/13/19 2007 07/14/19 0416 07/14/19 0732  BP: 129/84 118/82 (!) 130/93 125/86  Pulse: 74 73 73 76  Resp: (!) 22  (!) 21 20  Temp: 97.6 F (36.4 C) 97.8 F (36.6 C) 97.8 F (36.6 C) 97.6 F (36.4 C)  TempSrc: Oral Oral Oral Oral  SpO2: 91% 100% 96% 97%  Weight:      Height:        Wt Readings from Last 3 Encounters:  07/10/2019 59.9 kg  04/01/19 65.8 kg  04/01/19 64.4 kg     Intake/Output Summary (Last 24 hours) at  07/14/2019 1103 Last data filed at 07/14/2019 1000 Gross per 24 hour  Intake 960 ml  Output 800 ml  Net 160 ml     Physical Exam  Awake Alert,  No new F.N deficits, Normal affect [place].AT,PERRAL Supple Neck,No JVD, No cervical lymphadenopathy appriciated.  Symmetrical Chest wall movement, Good air movement bilaterally, +ve rales RRR,No Gallops, Rubs or new Murmurs, No Parasternal Heave +ve B.Sounds, Abd Soft, No tenderness, No organomegaly appriciated, No rebound - guarding or rigidity. No Cyanosis, Clubbing or edema, No new Rash or bruise    Data Review:    CBC Recent Labs  Lab 07/10/19 0358 07/11/19 0239 07/12/19 0150 07/13/19 0145 07/14/19 0138  WBC 17.9* 20.1* 16.5* 17.2* 16.3*  HGB 14.6 15.1* 14.4 14.5 14.6  HCT 44.7 47.4* 45.8 45.0 45.4  PLT 209 225 199 202 192  MCV 95.9 97.3 97.2 96.2 96.4  MCH 31.3 31.0 30.6 31.0 31.0  MCHC 32.7  31.9 31.4 32.2 32.2  RDW 14.4 14.5 14.3 14.3 14.0  LYMPHSABS  --   --   --   --  1.4  MONOABS  --   --   --   --  1.0  EOSABS  --   --   --   --  0.2  BASOSABS  --   --   --   --  0.1    Chemistries  Recent Labs  Lab 07/10/19 0358 07/11/19 0239 07/12/19 0150 07/13/19 0145 07/14/19 0138  NA 142 144 139 141 138  K 4.2 4.4 4.4 4.5 4.4  CL 112* 111 107 106 102  CO2 21* 22 22 23 24   GLUCOSE 223* 141* 137* 128* 137*  BUN 62* 52* 49* 56* 55*  CREATININE 1.43* 1.26* 1.24* 1.34* 1.36*  CALCIUM 8.8* 9.2 8.8* 8.8* 8.7*  MG  --   --   --   --  1.8  AST 28 30 22 27  45*  ALT 22 22 21 25  55*  ALKPHOS 126 141* 127* 144* 197*  BILITOT 1.6* 1.7* 1.1 1.4* 1.2   ------------------------------------------------------------------------------------------------------------------ No results for input(s): CHOL, HDL, LDLCALC, TRIG, CHOLHDL, LDLDIRECT in the last 72 hours.  Lab Results  Component Value Date   HGBA1C 6.8 (H) 07/06/2019   ------------------------------------------------------------------------------------------------------------------ No results for input(s): TSH, T4TOTAL, T3FREE, THYROIDAB in the last 72 hours.  Invalid input(s): FREET3 ------------------------------------------------------------------------------------------------------------------ Recent Labs    07/12/19 0150 07/13/19 0145  FERRITIN 1,091* 764*    Coagulation profile No results for input(s): INR, PROTIME in the last 168 hours.  Recent Labs    07/13/19 0145 07/14/19 0138  DDIMER 2.31* 2.07*    Cardiac Enzymes No results for input(s): CKMB, TROPONINI, MYOGLOBIN in the last 168 hours.  Invalid input(s): CK ------------------------------------------------------------------------------------------------------------------    Component Value Date/Time   BNP 506.5 (H) 07/14/2019 0138    Micro Results No results found for this or any previous visit (from the past 240 hour(s)).  Radiology  Reports DG CHEST PORT 1 VIEW  Result Date: 07/09/2019 CLINICAL DATA:  Shortness of breath EXAM: PORTABLE CHEST 1 VIEW COMPARISON:  07/06/2019 FINDINGS: Patchy bilateral airspace disease again noted, worsening since prior study, particularly in the left lower lobe. Cardiomegaly. Pacer remains in place, unchanged. No effusions or pneumothorax. No acute bony abnormality. IMPRESSION: Patchy bilateral airspace opacities, worsening since prior study, particularly in the left lower lobe. Electronically Signed   By: Rolm Baptise M.D.   On: 07/09/2019 21:47   DG Chest Port 1 View  Result Date: 07/03/2019 CLINICAL DATA:  COVID-19 positive.  Worsening shortness of breath. EXAM: PORTABLE CHEST 1 VIEW COMPARISON:  07/01/2019. FINDINGS: Surgical clips noted over the neck. AICD in stable position. Stable cardiomegaly. Diffuse severe bilateral pulmonary infiltrates again noted. Slight improvement in aeration on today's exam. No pleural effusion pneumothorax. Old infarct proximal right humerus. No acute bony abnormality. IMPRESSION: 1.  AICD in stable position.  Stable cardiomegaly. 2. Diffuse severe bilateral pulmonary infiltrates again noted. Slight improvement in aeration on today's exam. Electronically Signed   By: Marcello Moores  Register   On: 07/03/2019 07:15   DG Chest Port 1 View  Result Date: 07/23/2019 CLINICAL DATA:  Hypoxia. EXAM: PORTABLE CHEST 1 VIEW COMPARISON:  None. FINDINGS: There is a dual lead AICD. Moderate severity diffuse bilateral infiltrates are seen. There is a small right pleural effusion. No pneumothorax is identified. The cardiac silhouette is markedly enlarged. Multiple radiopaque surgical clips are seen overlying the superior mediastinum. There is marked severity levoscoliosis of the thoracic spine with moderate severity multilevel degenerative changes. IMPRESSION: 1. Moderate severity diffuse bilateral infiltrates with a small right pleural effusion. 2. Marked enlargement of the cardiac silhouette.  3. Dual lead AICD in place. Electronically Signed   By: Virgina Norfolk M.D.   On: 07/03/2019 15:50   DG Chest Port 1V today  Result Date: 07/06/2019 CLINICAL DATA:  Dyspnea, COVID-19 positive EXAM: PORTABLE CHEST 1 VIEW COMPARISON:  07/03/2019 chest radiograph. FINDINGS: Stable configuration of 2 lead left subclavian pacemaker. Surgical clips overlie the central lower neck. Stable cardiomediastinal silhouette with mild cardiomegaly. No pneumothorax. No pleural effusion. Patchy hazy opacities throughout the mid to lower lungs bilaterally, similar to minimally improved. IMPRESSION: Patchy hazy opacities throughout the mid to lower lungs bilaterally, similar to minimally improved, compatible with COVID-19 pneumonia. Stable cardiomegaly. Electronically Signed   By: Ilona Sorrel M.D.   On: 07/06/2019 13:09

## 2019-07-14 NOTE — Evaluation (Addendum)
Clinical/Bedside Swallow Evaluation Patient Details  Name: [name] MRN: [id number] Date of Birth: [birth year]  Today's Date: 07/14/2019 Time: SLP Start Time (ACUTE ONLY): [id number] SLP Stop Time (ACUTE ONLY): 1520 SLP Time Calculation (min) (ACUTE ONLY): 21 min  Past Medical History:  Past Medical History:  Diagnosis Date  . Amiodarone toxicity   . Anemia   . Chronic bronchitis   . Chronic systolic CHF (congestive heart failure) ([place])    a. 04/2017 Echo: EF 25%.  . Congenital heart disease   . Diabetes mellitus without complication ([place])    Type II  . Diverticulosis   . Fall due to stumbling October 08, 2013   Due to shoes  . GERD (gastroesophageal reflux disease)   . Gout   . Hemorrhoids   . Hyperlipidemia   . Hyperparathyroidism   . Hypertension   . Hypothyroidism   . IDA (iron deficiency anemia)   . Microscopic colitis 9/11   Colonoscopy  . NICM (nonischemic cardiomyopathy) - tachycardia induced in the setting of AFib ([place])    a. 04/2017 Echo: EF 25%, basal-mid inferolateral, inf AK, triv AI, mild MR, sev dil LA, mod red RV fxn, mildly dil RA, mild to mod TR, PASP 2mmHg.  . Non-obstructive CAD (coronary artery disease)    a. 04/2017 Cath: LM 20p, LCX nl, RCA 20p/m.  Marland Kitchen Paroxysmal SVT (supraventricular tachycardia) ([place])   . Persistent atrial fibrillation ([place]) 06/2013   a. 04/2017 Admit with CHF/NICM/AFib -> CHA2DS2VASc = 4-->Xarelto 15 mg daily started; b. 06/2017 s/p BiV PPM (SJM ser # [id number]) and AVN ablation.  . Presence of permanent cardiac pacemaker 06/29/2017   Biventricular pacemaker placed  . PVD (peripheral vascular disease) ([place]) 09/2004   Right common femoral endarterectomy in April of 2007  . Renal insufficiency   . Schatzki's ring    Last EGD with esophageal dilatation 53F  9/11  . Scoliosis   . Varicose veins right leg pain and swelling   Past Surgical History:  Past Surgical History:  Procedure Laterality Date  . ASD REPAIR  Age 48   [facility]  . AV NODE ABLATION N/A 06/29/2017   Procedure: AV NODE ABLATION;  Surgeon: Evans Lance, MD;  Location: [place] [facility] LAB;  Service: Cardiovascular;  Laterality: N/A;  . BIOPSY  02/13/2017   Procedure: BIOPSY;  Surgeon: Daneil Dolin, MD;  Location[facility];  Service: Endoscopy;;  duodenum gastric  . BIV PACEMAKER INSERTION CRT-P N/A 06/29/2017   Procedure: BIV PACEMAKER INSERTION CRT-P;  Surgeon: Evans Lance, MD;  Location: [place] [facility] LAB;  Service: Cardiovascular;  Laterality: N/A;  . CARDIOVERSION N/A 11/13/2013   Procedure: CARDIOVERSION;  Surgeon: Herminio Commons, MD;  Location: [facility] ORS;  Service: Endoscopy;  Laterality: N/A;  . COLONOSCOPY  03/04/2010   anal canal hemorrhoids otherwise normal. TI normal. Bx showed lymphocytic colitis. next TCS 02/2020  . COLONOSCOPY  April 2008   [place]hman: Pancolonic diverticulosis, external hemorrhoids  . COLONOSCOPY N/A 11/30/2015   RMR: Normal terminal ileum for 10 cm, nonbleeding grade 1 internal hemorrhoids, colonic diverticulosis. Next colonoscopy in 2027.  Marland Kitchen COLONOSCOPY WITH PROPOFOL N/A 02/24/2019   Procedure: COLONOSCOPY WITH PROPOFOL;  Surgeon: Daneil Dolin, MD;  [facility];  Service: Endoscopy;  Laterality: N/A;  11:15am  . ELAS  06-01-11   Right saphenous ELAS   . ESOPHAGEAL DILATION N/A 05/28/2015   Procedure: ESOPHAGEAL DILATION;  Surgeon: Daneil Dolin, MD;  [facility];  Service: Endoscopy;  Laterality: N/A;  . ESOPHAGOGASTRODUODENOSCOPY  03/04/2010   noncritical appearing Schatzki ring. SB bx negative  . ESOPHAGOGASTRODUODENOSCOPY  05/2008   erosive reflux esophagatitis, noncritical Schatzki ring  . ESOPHAGOGASTRODUODENOSCOPY N/A 05/28/2015   [id number] esophagus somewhat baggy, likely due to underlying esophageal motility disorder/small HH   . ESOPHAGOGASTRODUODENOSCOPY N/A 02/13/2017   Dr. Gala Romney: subtly abnormal stomach of doubtful clinical significant s/p  biopsy, normal duodenum s/p biopsy, fundic gland polyp on path, normal duodenal biopsy  . ESOPHAGOGASTRODUODENOSCOPY (EGD) WITH PROPOFOL N/A 02/24/2019   Procedure: ESOPHAGOGASTRODUODENOSCOPY (EGD) WITH PROPOFOL;  Surgeon: Daneil Dolin, MD;  [facility];  Service: Endoscopy;  Laterality: N/A;  . [place] SURGERY  2010  . Left hemithyroidectomy    . Left parathyroidectomy  2007  . MALONEY DILATION N/A 02/24/2019   Procedure: Venia Minks DILATION;  Surgeon: Daneil Dolin, MD;  [facility];  Service: Endoscopy;  Laterality: N/A;  . Parathyroid adenoma  2007  . Right common femoral endarterectomy  2007  . RIGHT/LEFT HEART CATH AND CORONARY ANGIOGRAPHY N/A 05/15/2017   Procedure: RIGHT/LEFT HEART CATH AND CORONARY ANGIOGRAPHY;  Surgeon: Troy Sine, MD;  Location: [place] [facility];  Service: Cardiovascular;  Laterality: N/A;  . Small bowel capsule  12 2009   Mid to distal small bowel with edema, erosions, tiny ulceration felt to be NSAID related  . TONSILLECTOMY     HPI:  64 y.o. female, PMHx including amiodarone toxicity, interstitial lung disease, chronic systolic/diastolic heart failure, GERD, multiple esophageal dilations,  congenital heart disease, DM II, iron deficient anemia, hypertension,hyperparathyroidism, peripheral vascular disease, renal insufficiency, persistent A. fib  presented shortness of breath, patient was recently diagnosed 12/31 with COVID-19, Pt seen by ST 07/08/19- functional swallow- no dysphagia identified, reg/thin and ST signed off. BSE reordered- per MD pt with increased hypoxia and questionable aspiration. RN has not obsered s/s aspiration- pt has been declining most po's.   Assessment / Plan / Recommendation Clinical Impression  Pt's swallow function appears similar to initial assessment 07/08/19. Oral decontamination for xerostomia, dry/blood tinged lips, tongue and hard palate. Multiple straw sips thin was funtional with respirations  stable. Minimal lingual residue given decreased saliva production. Pt states she is able to taste but cannot give specifics re: decreased intake. Given condition of lips/oral cavity will downgrade to Dys 3 (chopped meats), continue thin, crush meds, straws allowed. Pt agreeable to vanilla ice cream and eating as therapist left room. Will keep on caseload to see minimum of once more for tolerance and modification if needed. Please continue frequent oral care.     SLP Visit Diagnosis: Dysphagia, unspecified (R13.10)    Aspiration Risk  Mild aspiration risk    Diet Recommendation Dysphagia 3 (Fine chop);Thin liquid   Liquid Administration via: Cup;Straw Medication Administration: Whole meds with puree Supervision: Patient able to self feed Compensations: Slow rate;Small sips/bites Postural Changes: Seated upright at 90 degrees    Other  Recommendations Oral Care Recommendations: Oral care BID   Follow up Recommendations None      Frequency and Duration min 1 x/week  2 weeks       Prognosis Prognosis for Safe Diet Advancement: Good Barriers to Reach Goals: Cognitive deficits      Swallow Study   General HPI: 64 y.o. female, PMHx including amiodarone toxicity, interstitial lung disease, chronic systolic/diastolic heart failure, GERD, multiple esophageal dilations,  congenital heart disease, DM II, iron deficient anemia, hypertension,hyperparathyroidism, peripheral vascular disease,  renal insufficiency, persistent A. fib  presented shortness of breath, patient was recently diagnosed 12/31 with COVID-19, Pt seen by ST 07/08/19- functional swallow- no dysphagia identified, reg/thin and ST signed off. BSE reordered- per MD pt with increased hypoxia and questionable aspiration. RN has not obsered s/s aspiration- pt has been declining most po's. Type of Study: Bedside Swallow Evaluation Previous Swallow Assessment: (see HPI) Diet Prior to this Study: Regular;Thin liquids Temperature Spikes  Noted: No Respiratory Status: Nasal cannula History of Recent Intubation: No Behavior/Cognition: Alert;Cooperative;Pleasant mood;Requires cueing Oral Cavity Assessment: Dry;Dried secretions Oral Care Completed by SLP: Yes Vision: Functional for self-feeding Self-Feeding Abilities: Able to feed self Patient Positioning: Upright in chair Baseline Vocal Quality: Normal Volitional Cough: Weak Volitional Swallow: Able to elicit    Oral/Motor/Sensory Function Overall Oral Motor/Sensory Function: Within functional limits   Ice Chips Ice chips: Not tested   Thin Liquid Thin Liquid: Within functional limits Presentation: Cup;Straw    Nectar Thick Nectar Thick Liquid: Not tested   Honey Thick Honey Thick Liquid: Not tested   Puree Puree: Not tested   Solid     Solid: Within functional limits      Houston Siren 07/14/2019,4:31 PM  Orbie Pyo Colvin Caroli.Ed Risk analyst [phone] Office [phone]

## 2019-07-15 LAB — COMPREHENSIVE METABOLIC PANEL
ALT: 56 U/L — ABNORMAL HIGH (ref 0–44)
AST: 38 U/L (ref 15–41)
Albumin: 2.4 g/dL — ABNORMAL LOW (ref 3.5–5.0)
Alkaline Phosphatase: 214 U/L — ABNORMAL HIGH (ref 38–126)
Anion gap: 10 (ref 5–15)
BUN: 49 mg/dL — ABNORMAL HIGH (ref 8–23)
CO2: 31 mmol/L (ref 22–32)
Calcium: 8.8 mg/dL — ABNORMAL LOW (ref 8.9–10.3)
Chloride: 100 mmol/L (ref 98–111)
Creatinine, Ser: 1.26 mg/dL — ABNORMAL HIGH (ref 0.44–1.00)
GFR calc Af Amer: 53 mL/min — ABNORMAL LOW (ref >60)
GFR calc non Af Amer: 45 mL/min — ABNORMAL LOW (ref >60)
Glucose, Bld: 169 mg/dL — ABNORMAL HIGH (ref 70–99)
Potassium: 4 mmol/L (ref 3.5–5.1)
Sodium: 141 mmol/L (ref 135–145)
Total Bilirubin: 1 mg/dL (ref 0.3–1.2)
Total Protein: 6.1 g/dL — ABNORMAL LOW (ref 6.5–8.1)

## 2019-07-15 LAB — CBC WITH DIFFERENTIAL/PLATELET
Abs Immature Granulocytes: 0.5 10*3/uL — ABNORMAL HIGH (ref 0.00–0.07)
Basophils Absolute: 0.1 10*3/uL (ref 0.0–0.1)
Basophils Relative: 1 %
Eosinophils Absolute: 0.1 10*3/uL (ref 0.0–0.5)
Eosinophils Relative: 1 %
HCT: 46.2 % — ABNORMAL HIGH (ref 36.0–46.0)
Hemoglobin: 14.7 g/dL (ref 12.0–15.0)
Immature Granulocytes: 3 %
Lymphocytes Relative: 8 %
Lymphs Abs: 1.1 10*3/uL (ref 0.7–4.0)
MCH: 30.9 pg (ref 26.0–34.0)
MCHC: 31.8 g/dL (ref 30.0–36.0)
MCV: 97.3 fL (ref 80.0–100.0)
Monocytes Absolute: 0.8 10*3/uL (ref 0.1–1.0)
Monocytes Relative: 6 %
Neutro Abs: 12.1 10*3/uL — ABNORMAL HIGH (ref 1.7–7.7)
Neutrophils Relative %: 81 %
Platelets: 163 10*3/uL (ref 150–400)
RBC: 4.75 MIL/uL (ref 3.87–5.11)
RDW: 14.2 % (ref 11.5–15.5)
WBC: 14.8 10*3/uL — ABNORMAL HIGH (ref 4.0–10.5)
nRBC: 0 % (ref 0.0–0.2)

## 2019-07-15 LAB — BRAIN NATRIURETIC PEPTIDE: B Natriuretic Peptide: 574.7 pg/mL — ABNORMAL HIGH (ref 0.0–100.0)

## 2019-07-15 LAB — GLUCOSE, CAPILLARY
Glucose-Capillary: 178 mg/dL — ABNORMAL HIGH (ref 70–99)
Glucose-Capillary: 369 mg/dL — ABNORMAL HIGH (ref 70–99)
Glucose-Capillary: 296 mg/dL — ABNORMAL HIGH (ref 70–99)
Glucose-Capillary: 93 mg/dL (ref 70–99)

## 2019-07-15 LAB — D-DIMER, QUANTITATIVE: D-Dimer, Quant: 1.38 ug/mL-FEU — ABNORMAL HIGH (ref 0.00–0.50)

## 2019-07-15 LAB — C-REACTIVE PROTEIN: CRP: 1.1 mg/dL — ABNORMAL HIGH (ref <1.0)

## 2019-07-15 LAB — MAGNESIUM: Magnesium: 2.1 mg/dL (ref 1.7–2.4)

## 2019-07-15 LAB — PROCALCITONIN: Procalcitonin: 0.15 ng/mL

## 2019-07-15 MED ORDER — FUROSEMIDE 10 MG/ML IJ SOLN
60.0000 mg | Freq: Once | INTRAMUSCULAR | Status: AC
  Administered 2019-07-15: 60 mg via INTRAVENOUS
  Filled 2019-07-15: qty 6

## 2019-07-15 MED ORDER — SPIRONOLACTONE 25 MG PO TABS
25.0000 mg | ORAL_TABLET | Freq: Once | ORAL | Status: AC
  Administered 2019-07-15: 25 mg via ORAL
  Filled 2019-07-15: qty 1

## 2019-07-15 MED ORDER — AMOXICILLIN-POT CLAVULANATE 500-125 MG PO TABS
1.0000 | ORAL_TABLET | Freq: Two times a day (BID) | ORAL | Status: AC
  Administered 2019-07-15 – 2019-07-17 (×6): 500 mg via ORAL
  Filled 2019-07-15 (×6): qty 1

## 2019-07-15 NOTE — Plan of Care (Signed)
Pt updated brother [name] via phone. Brother has no questions at this time per pt. VSS however pt continues to experience dyspnea on exertion and occasionally at rest, and will request NRB. Appetite poor, RN offering Ensure supplements. Good UOP. Plan for inpatient rehab.  Problem: Education: Goal: Knowledge of General Education information will improve Description: Including pain rating scale, medication(s)/side effects and non-pharmacologic comfort measures Outcome: Progressing   Problem: Health Behavior/Discharge Planning: Goal: Ability to manage health-related needs will improve Outcome: Progressing   Problem: Clinical Measurements: Goal: Ability to maintain clinical measurements within normal limits will improve Outcome: Progressing Goal: Will remain free from infection Outcome: Progressing Goal: Diagnostic test results will improve Outcome: Progressing Goal: Respiratory complications will improve Outcome: Progressing Goal: Cardiovascular complication will be avoided Outcome: Progressing   Problem: Coping: Goal: Level of anxiety will decrease Outcome: Progressing   Problem: Elimination: Goal: Will not experience complications related to bowel motility Outcome: Progressing Goal: Will not experience complications related to urinary retention Outcome: Progressing   Problem: Pain Managment: Goal: General experience of comfort will improve Outcome: Progressing   Problem: Safety: Goal: Ability to remain free from injury will improve Outcome: Progressing   Problem: Skin Integrity: Goal: Risk for impaired skin integrity will decrease Outcome: Progressing   Problem: Education: Goal: Knowledge of risk factors and measures for prevention of condition will improve Outcome: Progressing   Problem: Coping: Goal: Psychosocial and spiritual needs will be supported Outcome: Progressing   Problem: Respiratory: Goal: Will maintain a patent airway Outcome: Progressing Goal:  Complications related to the disease process, condition or treatment will be avoided or minimized Outcome: Progressing   Problem: Activity: Goal: Risk for activity intolerance will decrease Outcome: Not Progressing   Problem: Nutrition: Goal: Adequate nutrition will be maintained Outcome: Not Progressing

## 2019-07-15 NOTE — Progress Notes (Signed)
Physical Therapy Treatment Patient Details Name: [name] MRN: [id number] DOB: 09/22/1955 Today's Date: 07/15/2019    History of Present Illness 64 y.o. female, PMHx including amiodarone toxicity, interstitial lung disease, chronic systolic/diastolic heart failure, GERD,  history of congenital heart disease, DM II, iron deficient anemia, hypertension,hyperparathyroidism, peripheral vascular disease, renal insufficiency, persistent A. fib anticoagulated on Xarelto presented to ED w/ shortness of breath, patient was recently diagnosed 12/31 with COVID-19, at baseline has no oxygen requirement, patient reported her symptoms started on 1/3, with progressive dyspnea, fever, chills, cough, neurolyse body ache, denies nausea, vomiting, diarrhea, chest pain, leg edema, or orthopnea. in ED was febrile 103, hypoxic in the mid 80s, requiring initially 6 L nasal cannula, chest x-ray significant for diffuse bilateral opacities, pt admited for COVID-19 for pneumonia.    PT Comments    Pt look extremely fatigued this pm, she stated that she didn't think she could tolerate ambulation this session. Completed seated therapeutic exercises and breathing exercises, pt c/o feeling very full and needing to urinate constantly, agreeable to standing to clean up and donn clean brief. Pt needed min a x 2 to complete this unable to stand w/o B support. Pt initially on 10L/min via HFNC, needed increase to 12L/min via HFNC with activity and still sats remained in low 80s with activities, with pursed lip breathing and flutter valve use was minimally able to increase saturations to high 80s.     Follow Up Recommendations  SNF     Equipment Recommendations       Recommendations for Other Services       Precautions / Restrictions Precautions Precautions: Fall;Other (comment) Precaution Comments: Monitor oxygen; desats with exertion Restrictions Weight Bearing Restrictions: No    Mobility  Bed Mobility                General bed mobility comments: pt received sitting in recliner  Transfers Overall transfer level: Needs assistance Equipment used: 2 person hand held assist Transfers: Sit to/from Stand Sit to Stand: Min assist         General transfer comment: needed increased assist to complete today, needed assist to stand and remove under garmets to clean up, states she is very uncomfortable as she feels as if she is always needing to urinate  Ambulation/Gait             General Gait Details: stated she was not able to tolerate today   Stairs             Wheelchair Mobility    Modified Rankin (Stroke Patients Only)       Balance Overall balance assessment: Needs assistance Sitting-balance support: Feet supported Sitting balance-Leahy Scale: Fair     Standing balance support: During functional activity;Bilateral upper extremity supported Standing balance-Leahy Scale: Poor                              Cognition Arousal/Alertness: Lethargic Behavior During Therapy: Flat affect Overall Cognitive Status: No family/caregiver present to determine baseline cognitive functioning                                        Exercises General Exercises - Lower Extremity Ankle Circles/Pumps: AROM;Strengthening;Both;10 reps Long Arc Quad: AROM;Strengthening;Both;10 reps Straight Leg Raises: AROM;Strengthening;Both;10 reps Hip Flexion/Marching: AROM;Strengthening;Both;10 reps Other Exercises Other Exercises: flutter valve x 10  General Comments        Pertinent Vitals/Pain Pain Assessment: Faces Faces Pain Scale: Hurts a little bit Pain Location: w/ increased work of breathing after activity Pain Descriptors / Indicators: Grimacing;Discomfort Pain Intervention(s): Limited activity within patient's tolerance;Monitored during session    Home Living                      Prior Function            PT Goals (current goals  can now be found in the care plan section) Acute Rehab PT Goals Patient Stated Goal: states i dont feel as if i can stand and walk today Time For Goal Achievement: 07/18/19 Potential to Achieve Goals: Fair Progress towards PT goals: Not progressing toward goals - comment(poor tolerance this session)    Frequency    Min 2X/week      PT Plan Current plan remains appropriate    Co-evaluation              AM-PAC PT "6 Clicks" Mobility   Outcome Measure  Help needed turning from your back to your side while in a flat bed without using bedrails?: A Lot Help needed moving from lying on your back to sitting on the side of a flat bed without using bedrails?: A Lot Help needed moving to and from a bed to a chair (including a wheelchair)?: A Lot Help needed standing up from a chair using your arms (e.g., wheelchair or bedside chair)?: A Lot Help needed to walk in hospital room?: A Lot Help needed climbing 3-5 steps with a railing? : Total 6 Click Score: 11    End of Session Equipment Utilized During Treatment: Oxygen Activity Tolerance: Patient limited by fatigue;Patient limited by lethargy;Treatment limited secondary to medical complications (Comment) Patient left: in chair;with call bell/phone within reach Nurse Communication: Mobility status PT Visit Diagnosis: Other abnormalities of gait and mobility (R26.89);Muscle weakness (generalized) (M62.81)     Time: [id number] PT Time Calculation (min) (ACUTE ONLY): 30 min  Charges:  $Therapeutic Exercise: 8-22 mins $Therapeutic Activity: 8-22 mins                     Horald Chestnut, PT    Delford Field 07/15/2019, 4:25 PM

## 2019-07-15 NOTE — Progress Notes (Signed)
PROGRESS NOTE                                                                                                                                                                                                             Patient Demographics:    [name], is a 64 y.o. female, DOB - 1955/10/03, [id number]  Outpatient Primary MD for the patient is Celene Squibb, MD   Admit date - 07/05/2019   LOS - 43  Chief Complaint  Patient presents with  . Shortness of Breath  . covid pos       Brief Narrative:  Patient is a 64 y.o. 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    Post admission-she had significant worsening of her hypoxemia-requiring 100% NRB-she was then transferred to [facility].  Hypoxia gradually improved with steroids/remdesivir and empiric antibiotics-however on 1/12-1/13-hypoxia briefly worsened-chest x-ray showed a new left-sided infiltrate.  Hospital course was also complicated by development of AKI and hypernatremia.  Patient was provided supportive care with IV fluids, and was restarted on IV antibiotics (Unasyn)-with further improvement of hypoxemia.  See below for further details.    COVID-19 Medications:  Steroids: 1/6>> 1/15 Remdesivir: 1/6>>1/10 Convalescent Plasma: 1/7   Antibiotics: Unasyn: 1/14>> Cefepime: 1/6>>1/12 Vancomycin: 1/6>>1/8   Subjective:   Patient in bed, appears comfortable, denies any headache, no fever, no chest pain or pressure, improved  shortness of breath , no abdominal  pain. No focal weakness.   Assessment  & Plan :   Acute Hypoxic Resp Failure due to Covid 19 Viral pneumonia and concurrent bacterial pneumonia: she had severe COVID-19 pneumonia along with bacterial pneumonia, she has been treated so far with IV steroids, remdesivir and convalescent plasma for her COVID-19 infection along with vancomycin, cefepime and subsequently Unasyn for the bacterial pneumonia.  She is gradually improving - responded well to Lasix and will give another dose IV on 1/19.   Continue to titrate down steroids and oxygen.  Advance activity.  Encouraged to sit up in  chair in the daytime use I-S and flutter valve for pulmonary toiletry and prone in bed at night if possible.  Also involve speech therapy to rule out ongoing aspiration.    O2 requirements:  SpO2: 93 % O2 Flow Rate (L/min): 6 L/min   COVID-19 Labs: Recent Labs    07/13/19 0145 07/14/19 0138 07/15/19 0556 07/15/19 0600  DDIMER 2.31* 2.07*  --  1.38*  FERRITIN 764*  --   --   --   CRP 0.9 1.0* 1.1*  --        Component Value Date/Time   BNP 574.7 (H) 07/15/2019 0556    Recent Labs  Lab 07/13/19 0500 07/14/19 0138 07/15/19 0600  PROCALCITON 0.12 0.14 0.15    Lab Results  Component Value Date   SARSCOV2NAA Detected (A) 06/26/2019   [place] NEGATIVE 02/20/2019   [place] Not Detected 12/18/2018    Prone/Incentive Spirometry: encouraged  incentive spirometry use 3-4/hour.   AKI on CKD stage IIIa: Likely due to fluid overload improving with Lasix.  Acute metabolic encephalopathy: Does not appear to be confused-she is hard of hearing-and is following commands.  Suspect mild metabolic encephalopathy was from hypoxemia, hyponatremia and AKI.    HTN: BP controlled-continue metoprolol  Persistent atrial fibrillation s/p AV node ablation and PPM insertion: Continue metoprolol-on Xarelto.  Rate under good control.  Paced rhythm on telemetry.  Note has history of amiodarone induced lung  toxicity  Chronic combined systolic and diastolic heart failure percent on echocardiogram in 02/2019: Dense of mild fluid overload, gentle Lasix again on 07/15/2019.  Bronchial asthma: Appears stable-no rhonchi-continue bronchodilators.  DM-2 (A1c 6.8): Has had hypoglycemic episodes a few days back-CBGs now trending back up-patient's appetite is seems to be slowly improving.  Start Lantus 10 units nightly-continue SSI and follow.  CBG (last 3)  Recent Labs    07/14/19 1645 07/14/19 2144 07/15/19 0809  GLUCAP 273* 146* 178*   Hypothyroidism: Continue levothyroxine  History of amiodarone induced lung toxicity/ILD: Supportive care.  Remote history of ASD repair  Deconditioning/debility: Appears very weak from acute illness-plans are for SNF when she is closer to discharge.  Consults  :  PCCM  Procedures  :  None  Condition - stable  Family Communication  :  Previous MD Brother-updated over the phone on 1/16  Code Status :  Full Code  Diet :  Diet Order            DIET DYS 3 Room service appropriate? Yes; Fluid consistency: Thin  Diet effective now               Disposition Plan  :  SNF.  Barriers to discharge: Hypoxia requiring O2 supplementation  Antimicorbials  :    Anti-infectives (From admission, onward)   Start     Dose/Rate Route Frequency Ordered Stop   07/15/19 1000  amoxicillin-clavulanate (AUGMENTIN) 500-125 MG per tablet 500 mg    Note to Pharmacy: Pharmacy can adjust for aspiration pneumonia, stop date 07/17/2019   1 tablet Oral 2 times daily 07/15/19 0754 07/18/19 0959   07/10/19 1000  ampicillin-sulbactam (UNASYN) 1.5 g in sodium chloride 0.9 % 100 mL IVPB  Status:  Discontinued     1.5 g 200 mL/hr over 30 Minutes Intravenous Every 8 hours 07/10/19 0945 07/15/19 0754   07/04/19 1830  vancomycin (VANCOREADY) IVPB 750 mg/150 mL  Status:  Discontinued     750 mg 150 mL/hr over 60 Minutes Intravenous Every 48 hours 07/03/19 1249 07/04/19 1110  07/04/19 1800  vancomycin (VANCOCIN) IVPB 1000 mg/200 mL premix  Status:  Discontinued     1,000 mg 200 mL/hr over 60 Minutes Intravenous Every 48 hours 06/28/2019 1727 07/03/19 1249   07/03/19 1800  ceFEPIme (MAXIPIME) 1 g in sodium chloride 0.9 % 100 mL IVPB  Status:  Discontinued     1 g 200 mL/hr over 30 Minutes Intravenous Every 24 hours 07/27/2019 1725 07/03/19 1248   07/03/19 1800  ceFEPIme (MAXIPIME) 2 g in sodium chloride 0.9 % 100 mL IVPB     2 g 200 mL/hr over 30 Minutes Intravenous Every 24 hours 07/03/19 1247 07/08/19 1850   07/03/19 1000  remdesivir 100 mg in sodium chloride 0.9 % 100 mL IVPB     100 mg 200 mL/hr over 30 Minutes Intravenous Daily 07/24/2019 1723 07/06/19 0926   07/03/19 1000  remdesivir 100 mg in sodium chloride 0.9 % 100 mL IVPB  Status:  Discontinued     100 mg 200 mL/hr over 30 Minutes Intravenous Daily 07/17/2019 2355 07/03/19 0257   07/22/2019 2354  remdesivir 200 mg in sodium chloride 0.9% 250 mL IVPB  Status:  Discontinued     200 mg 580 mL/hr over 30 Minutes Intravenous Once 07/09/2019 2355 07/03/19 0257   07/01/2019 1800  remdesivir 200 mg in sodium chloride 0.9% 250 mL IVPB     200 mg 580 mL/hr over 30 Minutes Intravenous Once 07/24/2019 1723 06/28/2019 2306   07/08/2019 1730  vancomycin (VANCOCIN) IVPB 1000 mg/200 mL premix  Status:  Discontinued     1,000 mg 200 mL/hr over 60 Minutes Intravenous  Once 07/14/2019 1722 07/23/2019 1725   07/20/2019 1730  ceFEPIme (MAXIPIME) 2 g in sodium chloride 0.9 % 100 mL IVPB     2 g 200 mL/hr over 30 Minutes Intravenous  Once 07/09/2019 1722 07/12/2019 1918   07/15/2019 1730  vancomycin (VANCOREADY) IVPB 1500 mg/300 mL     1,500 mg 150 mL/hr over 120 Minutes Intravenous  Once 06/30/2019 1725 07/07/2019 2036      DVT Prophylaxis  : Xarelto  Inpatient Medications  Scheduled Meds: . sodium chloride   Intravenous Once  . amoxicillin-clavulanate  1 tablet Oral BID  . vitamin C  500 mg Oral Daily  . fluticasone furoate-vilanterol  2 puff  Inhalation Daily  . insulin aspart  0-9 Units Subcutaneous TID WC  . levothyroxine  100 mcg Oral QAC breakfast  . metoprolol succinate  50 mg Oral Daily  . pantoprazole  40 mg Oral BID  . pravastatin  10 mg Oral Daily  . Rivaroxaban  15 mg Oral Q breakfast  . sodium zirconium cyclosilicate  10 g Oral Daily  . spironolactone  25 mg Oral Once  . zinc sulfate  220 mg Oral Daily   Continuous Infusions:  PRN Meds:.acetaminophen, albuterol, chlorpheniramine-HYDROcodone, guaiFENesin-dextromethorphan, lip balm, Melatonin, senna-docusate   Time Spent in minutes 25  See all Orders from today for further details   Lala Lund M.D on 07/15/2019 at 9:23 AM  To page go to [email or web address] - use universal password  [facility] -  Office  [phone]    Objective:   Vitals:   07/14/19 2000 07/14/19 2016 07/15/19 0400 07/15/19 0734  BP: 119/83 124/83 111/80 117/87  Pulse: 75 70 76 70  Resp:    (!) 22  Temp: 98.8 F (37.1 C)  98.1 F (36.7 C) 97.8 F (36.6 C)  TempSrc: Oral  Oral Oral  SpO2: 90% 91% 91% 93%  Weight:  Height:        Wt Readings from Last 3 Encounters:  07/20/2019 59.9 kg  04/01/19 65.8 kg  04/01/19 64.4 kg     Intake/Output Summary (Last 24 hours) at 07/15/2019 0923 Last data filed at 07/15/2019 0600 Gross per 24 hour  Intake 1220 ml  Output 750 ml  Net 470 ml     Physical Exam  Awake Alert,   No new F.N deficits, Normal affect .AT,PERRAL Supple Neck,No JVD, No cervical lymphadenopathy appriciated.  Symmetrical Chest wall movement, Good air movement bilaterally, +ve rales RRR,No Gallops, Rubs or new Murmurs, No Parasternal Heave +ve B.Sounds, Abd Soft, No tenderness, No organomegaly appriciated, No rebound - guarding or rigidity. No Cyanosis, Clubbing or edema, No new Rash or bruise    Data Review:    CBC Recent Labs  Lab 07/11/19 0239 07/12/19 0150 07/13/19 0145 07/14/19 0138 07/15/19 0600  WBC 20.1* 16.5* 17.2* 16.3* 14.8*    HGB 15.1* 14.4 14.5 14.6 14.7  HCT 47.4* 45.8 45.0 45.4 46.2*  PLT 225 199 202 192 163  MCV 97.3 97.2 96.2 96.4 97.3  MCH 31.0 30.6 31.0 31.0 30.9  MCHC 31.9 31.4 32.2 32.2 31.8  RDW 14.5 14.3 14.3 14.0 14.2  LYMPHSABS  --   --   --  1.4 1.1  MONOABS  --   --   --  1.0 0.8  EOSABS  --   --   --  0.2 0.1  BASOSABS  --   --   --  0.1 0.1    Chemistries  Recent Labs  Lab 07/11/19 0239 07/12/19 0150 07/13/19 0145 07/14/19 0138 07/15/19 0600  NA 144 139 141 138 141  K 4.4 4.4 4.5 4.4 4.0  CL 111 107 106 102 100  CO2 22 22 23 24 31   GLUCOSE 141* 137* 128* 137* 169*  BUN 52* 49* 56* 55* 49*  CREATININE 1.26* 1.24* 1.34* 1.36* 1.26*  CALCIUM 9.2 8.8* 8.8* 8.7* 8.8*  MG  --   --   --  1.8 2.1  AST 30 22 27  45* 38  ALT 22 21 25  55* 56*  ALKPHOS 141* 127* 144* 197* 214*  BILITOT 1.7* 1.1 1.4* 1.2 1.0   ------------------------------------------------------------------------------------------------------------------ No results for input(s): CHOL, HDL, LDLCALC, TRIG, CHOLHDL, LDLDIRECT in the last 72 hours.  Lab Results  Component Value Date   HGBA1C 6.8 (H) 07/06/2019   ------------------------------------------------------------------------------------------------------------------ No results for input(s): TSH, T4TOTAL, T3FREE, THYROIDAB in the last 72 hours.  Invalid input(s): FREET3 ------------------------------------------------------------------------------------------------------------------ Recent Labs    07/13/19 0145  FERRITIN 764*    Coagulation profile No results for input(s): INR, PROTIME in the last 168 hours.  Recent Labs    07/14/19 0138 07/15/19 0600  DDIMER 2.07* 1.38*    Cardiac Enzymes No results for input(s): CKMB, TROPONINI, MYOGLOBIN in the last 168 hours.  Invalid input(s): CK ------------------------------------------------------------------------------------------------------------------    Component Value Date/Time   BNP 574.7 (H)  07/15/2019 [id number]    Micro Results No results found for this or any previous visit (from the past 240 hour(s)).  Radiology Reports DG CHEST PORT 1 VIEW  Result Date: 07/09/2019 CLINICAL DATA:  Shortness of breath EXAM: PORTABLE CHEST 1 VIEW COMPARISON:  07/06/2019 FINDINGS: Patchy bilateral airspace disease again noted, worsening since prior study, particularly in the left lower lobe. Cardiomegaly. Pacer remains in place, unchanged. No effusions or pneumothorax. No acute bony abnormality. IMPRESSION: Patchy bilateral airspace opacities, worsening since prior study, particularly in the left lower lobe. Electronically Signed  By: Rolm Baptise M.D.   On: 07/09/2019 21:47   DG Chest Port 1 View  Result Date: 07/03/2019 CLINICAL DATA:  COVID-19 positive.  Worsening shortness of breath. EXAM: PORTABLE CHEST 1 VIEW COMPARISON:  06/27/2019. FINDINGS: Surgical clips noted over the neck. AICD in stable position. Stable cardiomegaly. Diffuse severe bilateral pulmonary infiltrates again noted. Slight improvement in aeration on today's exam. No pleural effusion pneumothorax. Old infarct proximal right humerus. No acute bony abnormality. IMPRESSION: 1.  AICD in stable position.  Stable cardiomegaly. 2. Diffuse severe bilateral pulmonary infiltrates again noted. Slight improvement in aeration on today's exam. Electronically Signed   By: Marcello Moores  Register   On: 07/03/2019 07:15   DG Chest Port 1 View  Result Date: 07/17/2019 CLINICAL DATA:  Hypoxia. EXAM: PORTABLE CHEST 1 VIEW COMPARISON:  None. FINDINGS: There is a dual lead AICD. Moderate severity diffuse bilateral infiltrates are seen. There is a small right pleural effusion. No pneumothorax is identified. The cardiac silhouette is markedly enlarged. Multiple radiopaque surgical clips are seen overlying the superior mediastinum. There is marked severity levoscoliosis of the thoracic spine with moderate severity multilevel degenerative changes. IMPRESSION: 1.  Moderate severity diffuse bilateral infiltrates with a small right pleural effusion. 2. Marked enlargement of the cardiac silhouette. 3. Dual lead AICD in place. Electronically Signed   By: Virgina Norfolk M.D.   On: 07/06/2019 15:50   DG Chest Port 1V today  Result Date: 07/06/2019 CLINICAL DATA:  Dyspnea, COVID-19 positive EXAM: PORTABLE CHEST 1 VIEW COMPARISON:  07/03/2019 chest radiograph. FINDINGS: Stable configuration of 2 lead left subclavian pacemaker. Surgical clips overlie the central lower neck. Stable cardiomediastinal silhouette with mild cardiomegaly. No pneumothorax. No pleural effusion. Patchy hazy opacities throughout the mid to lower lungs bilaterally, similar to minimally improved. IMPRESSION: Patchy hazy opacities throughout the mid to lower lungs bilaterally, similar to minimally improved, compatible with COVID-19 pneumonia. Stable cardiomegaly. Electronically Signed   By: Ilona Sorrel M.D.   On: 07/06/2019 13:09

## 2019-07-15 NOTE — Progress Notes (Signed)
Inpatient Rehabilitation Admissions Coordinator  Pt not yet at a level to tolerate more intense therapies at CIR. I will follow.  Danne Baxter, RN, MSN Rehab Admissions Coordinator [phone] 07/15/2019 5:35 PM

## 2019-07-15 NOTE — Plan of Care (Signed)
Some progress being made, difficult with developmental delay Problem: Education: Goal: Knowledge of General Education information will improve Description: Including pain rating scale, medication(s)/side effects and non-pharmacologic comfort measures Outcome: Not Met (add Reason)   Problem: Health Behavior/Discharge Planning: Goal: Ability to manage health-related needs will improve Outcome: Not Met (add Reason)   Problem: Clinical Measurements: Goal: Ability to maintain clinical measurements within normal limits will improve Outcome: Not Met (add Reason) Goal: Will remain free from infection Outcome: Not Met (add Reason) Goal: Diagnostic test results will improve Outcome: Not Met (add Reason) Goal: Respiratory complications will improve Outcome: Not Met (add Reason) Goal: Cardiovascular complication will be avoided Outcome: Not Met (add Reason)   Problem: Activity: Goal: Risk for activity intolerance will decrease Outcome: Not Met (add Reason)   Problem: Nutrition: Goal: Adequate nutrition will be maintained Outcome: Not Met (add Reason)   Problem: Coping: Goal: Level of anxiety will decrease Outcome: Not Met (add Reason)   Problem: Elimination: Goal: Will not experience complications related to bowel motility Outcome: Not Met (add Reason) Goal: Will not experience complications related to urinary retention Outcome: Not Met (add Reason)   Problem: Pain Managment: Goal: General experience of comfort will improve Outcome: Not Met (add Reason)   Problem: Safety: Goal: Ability to remain free from injury will improve Outcome: Not Met (add Reason)   Problem: Skin Integrity: Goal: Risk for impaired skin integrity will decrease Outcome: Not Met (add Reason)   Problem: Education: Goal: Knowledge of risk factors and measures for prevention of condition will improve Outcome: Not Met (add Reason)   Problem: Coping: Goal: Psychosocial and spiritual needs will be  supported Outcome: Not Met (add Reason)   Problem: Respiratory: Goal: Will maintain a patent airway Outcome: Not Met (add Reason) Goal: Complications related to the disease process, condition or treatment will be avoided or minimized Outcome: Not Met (add Reason)

## 2019-07-16 ENCOUNTER — Inpatient Hospital Stay (HOSPITAL_COMMUNITY): Payer: PPO

## 2019-07-16 LAB — CBC WITH DIFFERENTIAL/PLATELET
Abs Immature Granulocytes: 0.3 10*3/uL — ABNORMAL HIGH (ref 0.00–0.07)
Basophils Absolute: 0.1 10*3/uL (ref 0.0–0.1)
Basophils Relative: 1 %
Eosinophils Absolute: 0.2 10*3/uL (ref 0.0–0.5)
Eosinophils Relative: 1 %
HCT: 45.4 % (ref 36.0–46.0)
Hemoglobin: 14.5 g/dL (ref 12.0–15.0)
Immature Granulocytes: 2 %
Lymphocytes Relative: 6 %
Lymphs Abs: 0.9 10*3/uL (ref 0.7–4.0)
MCH: 31.9 pg (ref 26.0–34.0)
MCHC: 31.9 g/dL (ref 30.0–36.0)
MCV: 100 fL (ref 80.0–100.0)
Monocytes Absolute: 0.6 10*3/uL (ref 0.1–1.0)
Monocytes Relative: 4 %
Neutro Abs: 13.3 10*3/uL — ABNORMAL HIGH (ref 1.7–7.7)
Neutrophils Relative %: 86 %
Platelets: 147 10*3/uL — ABNORMAL LOW (ref 150–400)
RBC: 4.54 MIL/uL (ref 3.87–5.11)
RDW: 14.4 % (ref 11.5–15.5)
WBC: 15.4 10*3/uL — ABNORMAL HIGH (ref 4.0–10.5)
nRBC: 0 % (ref 0.0–0.2)

## 2019-07-16 LAB — COMPREHENSIVE METABOLIC PANEL
ALT: 74 U/L — ABNORMAL HIGH (ref 0–44)
AST: 50 U/L — ABNORMAL HIGH (ref 15–41)
Albumin: 2.4 g/dL — ABNORMAL LOW (ref 3.5–5.0)
Alkaline Phosphatase: 254 U/L — ABNORMAL HIGH (ref 38–126)
Anion gap: 12 (ref 5–15)
BUN: 58 mg/dL — ABNORMAL HIGH (ref 8–23)
CO2: 32 mmol/L (ref 22–32)
Calcium: 8.9 mg/dL (ref 8.9–10.3)
Chloride: 96 mmol/L — ABNORMAL LOW (ref 98–111)
Creatinine, Ser: 1.24 mg/dL — ABNORMAL HIGH (ref 0.44–1.00)
GFR calc Af Amer: 54 mL/min — ABNORMAL LOW (ref >60)
GFR calc non Af Amer: 46 mL/min — ABNORMAL LOW (ref >60)
Glucose, Bld: 295 mg/dL — ABNORMAL HIGH (ref 70–99)
Potassium: 4 mmol/L (ref 3.5–5.1)
Sodium: 140 mmol/L (ref 135–145)
Total Bilirubin: 0.8 mg/dL (ref 0.3–1.2)
Total Protein: 6.1 g/dL — ABNORMAL LOW (ref 6.5–8.1)

## 2019-07-16 LAB — GLUCOSE, CAPILLARY
Glucose-Capillary: 368 mg/dL — ABNORMAL HIGH (ref 70–99)
Glucose-Capillary: 375 mg/dL — ABNORMAL HIGH (ref 70–99)
Glucose-Capillary: 241 mg/dL — ABNORMAL HIGH (ref 70–99)
Glucose-Capillary: 298 mg/dL — ABNORMAL HIGH (ref 70–99)

## 2019-07-16 LAB — D-DIMER, QUANTITATIVE: D-Dimer, Quant: 0.95 ug/mL-FEU — ABNORMAL HIGH (ref 0.00–0.50)

## 2019-07-16 LAB — BRAIN NATRIURETIC PEPTIDE: B Natriuretic Peptide: 449.1 pg/mL — ABNORMAL HIGH (ref 0.0–100.0)

## 2019-07-16 LAB — C-REACTIVE PROTEIN: CRP: 2.1 mg/dL — ABNORMAL HIGH (ref <1.0)

## 2019-07-16 LAB — MAGNESIUM: Magnesium: 2 mg/dL (ref 1.7–2.4)

## 2019-07-16 LAB — PROCALCITONIN: Procalcitonin: 0.14 ng/mL

## 2019-07-16 MED ORDER — FUROSEMIDE 10 MG/ML IJ SOLN
60.0000 mg | Freq: Once | INTRAMUSCULAR | Status: AC
  Administered 2019-07-16: 60 mg via INTRAVENOUS
  Filled 2019-07-16: qty 6

## 2019-07-16 MED ORDER — METHYLPREDNISOLONE SODIUM SUCC 125 MG IJ SOLR
60.0000 mg | Freq: Every day | INTRAMUSCULAR | Status: DC
  Administered 2019-07-16 – 2019-07-17 (×2): 60 mg via INTRAVENOUS
  Filled 2019-07-16 (×2): qty 2

## 2019-07-16 NOTE — Progress Notes (Signed)
  Speech Language Pathology Treatment: Dysphagia  Patient Details Name: [name] MRN: [id number] DOB: [birth year] Today's Date: 07/16/2019 Time: 1423-2009 SLP Time Calculation (min) (ACUTE ONLY): 13 min  Assessment / Plan / Recommendation Clinical Impression  Pt appears brighter and lips with decreased dryness and sloughing. States appetite continues to be decreased. Oral and pharyngeal phase with thin via straw and peanut butter cracker was unremarkable. Sats and RR stable, no increase work of breathing. Aspiration risk appears low from clinical observations this week. Continues to require HFNC and is currently on 11 L. Recommend continue Dys 3, thin due to missing some posterior dentition and lips continuing to heal. ST will sign off.    HPI HPI: 64 y.o. female, PMHx including amiodarone toxicity, interstitial lung disease, chronic systolic/diastolic heart failure, GERD, multiple esophageal dilations,  congenital heart disease, DM II, iron deficient anemia, hypertension,hyperparathyroidism, peripheral vascular disease, renal insufficiency, persistent A. fib  presented shortness of breath, patient was recently diagnosed 12/31 with COVID-19, Pt seen by ST 07/08/19- functional swallow- no dysphagia identified, reg/thin and ST signed off. BSE reordered- per MD pt with increased hypoxia and questionable aspiration. RN has not obsered s/s aspiration- pt has been declining most po's.      SLP Plan  All goals met;Discharge SLP treatment due to (comment)       Recommendations  Diet recommendations: Dysphagia 3 (mechanical soft);Thin liquid Liquids provided via: Cup;Straw Medication Administration: Whole meds with puree Supervision: Patient able to self feed Postural Changes and/or Swallow Maneuvers: Seated upright 90 degrees                Oral Care Recommendations: Oral care BID Follow up Recommendations: None SLP Visit Diagnosis: Dysphagia, unspecified (R13.10) Plan: All goals  met;Discharge SLP treatment due to (comment)                       Houston Siren 07/16/2019, 3:52 PM   [phone]

## 2019-07-16 NOTE — Plan of Care (Signed)

## 2019-07-16 NOTE — Progress Notes (Signed)
Inpatient Diabetes Program Recommendations  AACE/ADA: New Consensus Statement on Inpatient Glycemic Control (2015)  Target Ranges:  Prepandial:   less than 140 mg/dL      Peak postprandial:   less than 180 mg/dL (1-2 hours)      Critically ill patients:  140 - 180 mg/dL   Lab Results  Component Value Date   GLUCAP 375 (H) 07/16/2019   HGBA1C 6.8 (H) 07/06/2019    Review of Glycemic Control Results for [name], [name] (MRN [id number]) as of 07/16/2019 12:12  Ref. Range 07/16/2019 07:28 07/16/2019 11:42  Glucose-Capillary Latest Ref Range: 70 - 99 mg/dL 368 (H) 375 (H)   Diabetes history: DM 2 Outpatient Diabetes medications: Januvia 50 mg QAM Current orders for Inpatient glycemic control:  Novolog 0-9 units  Solumedrol 60 mg Daily  Inpatient Diabetes Program Recommendations:    Glucose trends in 300's. Note Dr. Candiss Norse made mention of ordering Lantus for pt. With chart reviewed, Lantus is not ordered. Sent a secure chat message to Dr. Candiss Norse about my observation. Message seen no response. Will assume Dr. Candiss Norse will reevaluate.  Thanks,  [name] RN, MSN, BC-ADM Inpatient Diabetes Coordinator Team Pager [phone] (8a-5p)

## 2019-07-16 NOTE — Progress Notes (Signed)
Occupational Therapy Treatment Patient Details Name: [name] MRN: [id number] DOB: 1955-10-23 Today's Date: 07/16/2019    History of present illness 64 y.o. female, PMHx including amiodarone toxicity, interstitial lung disease, chronic systolic/diastolic heart failure, GERD,  history of congenital heart disease, DM II, iron deficient anemia, hypertension,hyperparathyroidism, peripheral vascular disease, renal insufficiency, persistent A. fib anticoagulated on Xarelto presented to ED w/ shortness of breath, patient was recently diagnosed 12/31 with COVID-19, at baseline has no oxygen requirement, patient reported her symptoms started on 1/3, with progressive dyspnea, fever, chills, cough, neurolyse body ache, denies nausea, vomiting, diarrhea, chest pain, leg edema, or orthopnea. in ED was febrile 103, hypoxic in the mid 80s, requiring initially 6 L nasal cannula, chest x-ray significant for diffuse bilateral opacities, pt admited for COVID-19 for pneumonia.   OT comments  Pt with improved cognition today as compared to last session. Able to transfer to [facility] with Min A. Requires use of NRB in addition to HFNC up to 25L during ADL and mobility due to increased WOB and desats to low 70s. Pt states she feels much more comfortable with use of NRB due to being a "mouth breather". "That helps a lot". Pt able to assist with bath I seated position. Completed flutter valve and incentive spirometer (able to pull @ 252ml) once seated in recliner. Will need post acute rehab. Will continue to follow acutely.   Follow Up Recommendations  SNF;CIR(pending progress)    Equipment Recommendations  3 in 1 bedside commode    Recommendations for Other Services      Precautions / Restrictions Precautions Precautions: Fall;Other (comment) Precaution Comments: Monitor oxygen; desats with exertion       Mobility Bed Mobility Overal bed mobility: Needs Assistance       Supine to sit: Min guard         Transfers Overall transfer level: Needs assistance   Transfers: Sit to/from Stand;Stand Pivot Transfers Sit to Stand: Min assist Stand pivot transfers: Min assist            Balance Overall balance assessment: Needs assistance   Sitting balance-Leahy Scale: Good       Standing balance-Leahy Scale: Poor Standing balance comment: reliant of external support                           ADL either performed or assessed with clinical judgement   ADL Overall ADL's : Needs assistance/impaired     Grooming: Set up;Sitting   Upper Body Bathing: Set up;Sitting   Lower Body Bathing: Moderate assistance;Sit to/from stand           Toilet Transfer: Minimal assistance;Stand-pivot   Toileting- Clothing Manipulation and Hygiene: Moderate assistance;Sit to/from stand Toileting - Clothing Manipulation Details (indicate cue type and reason): Required assistance to reach; limited by WOB     Functional mobility during ADLs: Minimal assistance;Rolling walker;Cueing for safety;Cueing for sequencing General ADL Comments: Difficulty using RW; May try without RW due to unsafe use of RW     Vision       Perception     Praxis      Cognition Arousal/Alertness: Awake/alert Behavior During Therapy: WFL for tasks assessed/performed Overall Cognitive Status: No family/caregiver present to determine baseline cognitive functioning Area of Impairment: Attention;Safety/judgement;Awareness;Problem solving                   Current Attention Level: Selective   Following Commands: Follows one step commands consistently Safety/Judgement: Decreased awareness  of safety Awareness: Emergent Problem Solving: Slow processing General Comments: Improved from last session although do not feel she is at baseline        Exercises Other Exercises Other Exercises: flutter valve x 10 Other Exercises: incentive spirometer - unable to reach 250 ml   Shoulder Instructions        General Comments      Pertinent Vitals/ Pain       Pain Assessment: Faces Faces Pain Scale: Hurts a little bit Pain Location: w/ increased work of breathing after activity Pain Descriptors / Indicators: Grimacing;Discomfort Pain Intervention(s): Limited activity within patient's tolerance  Home Living                                          Prior Functioning/Environment              Frequency  Min 3X/week        Progress Toward Goals  OT Goals(current goals can now be found in the care plan section)  Progress towards OT goals: Progressing toward goals  Acute Rehab OT Goals Patient Stated Goal: to get stronger Time For Goal Achievement: 07/20/19 Potential to Achieve Goals: Good ADL Goals Pt Will Perform Grooming: with supervision;standing Pt Will Perform Lower Body Bathing: with supervision;sit to/from stand Pt Will Perform Lower Body Dressing: with supervision;sit to/from stand Pt Will Transfer to Toilet: with supervision;ambulating Pt Will Perform Toileting - Clothing Manipulation and hygiene: with supervision;sit to/from stand Additional ADL Goal #1: Pt to recall and verbalize 3 fall prevention strategies with 0 verbal cues. Additional ADL Goal #2: Pt to recall and verbalize 3 energy conservation strategies with 0 verbal cues. Additional ADL Goal #3: Pt to tolerate standing up to 5 min with supervision with oxygen maintaining above 90%, in preparation for ADLs.  Plan Discharge plan remains appropriate    Co-evaluation                 AM-PAC OT "6 Clicks" Daily Activity     Outcome Measure   Help from another person eating meals?: None Help from another person taking care of personal grooming?: A Little Help from another person toileting, which includes using toliet, bedpan, or urinal?: A Lot Help from another person bathing (including washing, rinsing, drying)?: A Lot Help from another person to put on and taking off regular  upper body clothing?: A Little Help from another person to put on and taking off regular lower body clothing?: A Lot 6 Click Score: 16    End of Session Equipment Utilized During Treatment: Oxygen;Rolling walker(uup to 25 L)  OT Visit Diagnosis: Unsteadiness on feet (R26.81);Muscle weakness (generalized) (M62.81)   Activity Tolerance Patient tolerated treatment well   Patient Left in chair;with call bell/phone within reach;with chair alarm set   Nurse Communication Mobility status;Other (comment)(SpO2 needs; bright red blood when wiping form BM)        Time: [id number] OT Time Calculation (min): 53 min  Charges: OT General Charges $OT Visit: 1 Visit OT Treatments $Self Care/Home Management : 53-67 mins  Maurie Boettcher, OT/L   Acute OT Clinical Specialist Acute Rehabilitation Services Pager [phone] Office [phone]    [facility] 07/16/2019, 6:10 PM

## 2019-07-16 NOTE — Progress Notes (Signed)
PROGRESS NOTE                                                                                                                                                                                                             Patient Demographics:    [name], is a 64 y.o. female, DOB - 1956/06/25, [id number]  Outpatient Primary MD for the patient is Celene Squibb, MD   Admit date - 07/18/2019   LOS - 22  Chief Complaint  Patient presents with   Shortness of Breath   covid pos       Brief Narrative:  Patient is a 64 y.o. 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    Post admission-she had significant worsening of her hypoxemia-requiring 100% NRB-she was then transferred to [facility].  Hypoxia gradually improved with steroids/remdesivir and empiric antibiotics-however on 1/12-1/13-hypoxia briefly worsened-chest x-ray showed a new left-sided infiltrate.  Hospital course was also complicated by development of AKI and hypernatremia.  Patient was provided supportive care with IV fluids, and was restarted on IV antibiotics (Unasyn)-with further improvement of hypoxemia.  See below for further details.    COVID-19 Medications:  Steroids: 1/6>> 1/15 Remdesivir: 1/6>>1/10 Convalescent Plasma: 1/7   Antibiotics: Unasyn: 1/14>> Cefepime: 1/6>>1/12 Vancomycin: 1/6>>1/8   Subjective:   Patient in bed, appears comfortable, denies any headache, no fever, no chest pain or pressure, mild shortness of breath , no abdominal pain.  No focal weakness.    Assessment  & Plan :   Acute Hypoxic Resp Failure due to Covid 19 Viral pneumonia and concurrent bacterial pneumonia: she had severe COVID-19 pneumonia along with bacterial pneumonia, she has been treated so far with IV steroids, remdesivir and convalescent plasma for her COVID-19 infection along with vancomycin, cefepime and subsequently Unasyn/Augmentin.  For the bacterial pneumonia.  She is gradually improving - responded well to Lasix and will give another dose IV on 1/20.  We had titrated her steroids off however her progress has not gone well the last 3  days in fact her oxygen demand is gradually increasing again hence we will rechallenge her with IV steroids again on 07/16/2019.  Encouraged to sit up in chair in the daytime use I-S and flutter valve for pulmonary toiletry and prone in bed at night if possible.  Also involve speech therapy to rule out ongoing aspiration.    O2 requirements:  SpO2: 91 % O2 Flow Rate (L/min): 11 L/min   COVID-19 Labs: Recent Labs    07/14/19 0138 07/15/19 0556 07/15/19 0600 07/16/19 0506  DDIMER 2.07*  --  1.38* 0.95*  CRP 1.0* 1.1*  --  2.1*       Component Value Date/Time   BNP 449.1 (H) 07/16/2019 0506    Recent Labs  Lab 07/13/19 0500 07/14/19 0138 07/15/19 0600 07/16/19 0506  PROCALCITON 0.12 0.14 0.15 0.14    Lab Results  Component Value Date   SARSCOV2NAA Detected (A) 06/26/2019   [place] NEGATIVE 02/20/2019   [place] Not Detected 12/18/2018    Prone/Incentive Spirometry: encouraged  incentive spirometry use 3-4/hour.   AKI on CKD stage IIIa: Likely due to fluid overload improving with Lasix.  Acute metabolic encephalopathy: Does not appear to be confused-she is hard of hearing-and is following commands.  Suspect mild metabolic encephalopathy was from hypoxemia, hyponatremia and AKI.    HTN: BP controlled-continue metoprolol  Persistent atrial fibrillation s/p AV node ablation and PPM  insertion: Continue metoprolol-on Xarelto.  Rate under good control.  Paced rhythm on telemetry.  Note has history of amiodarone induced lung toxicity  Chronic combined systolic and diastolic heart failure percent on echocardiogram in 02/2019: Dense of mild fluid overload, gentle Lasix again on 07/15/2019.  Bronchial asthma: Appears stable-no rhonchi-continue bronchodilators.  DM-2 (A1c 6.8): Has had hypoglycemic episodes a few days back-CBGs now trending back up-patient's appetite is seems to be slowly improving.  Start Lantus 10 units nightly-continue SSI and follow.  CBG (last 3)  Recent Labs    07/15/19 1629 07/15/19 2036 07/16/19 0728  GLUCAP 296* 93 368*   Hypothyroidism: Continue levothyroxine  History of amiodarone induced lung toxicity/ILD: Supportive care.  Remote history of ASD repair  Deconditioning/debility: Appears very weak from acute illness-plans are for SNF when she is closer to discharge.    Consults  :  PCCM  Procedures  :  None  Condition - stable  Family Communication  : Brother updated on 07/16/2019. Prognosis, if" worsening despite aggressive medical treatment then comfort measures will be initiated.  DNR now.  Code Status :  DNR  Diet :  Diet Order            DIET DYS 3 Room service appropriate? Yes; Fluid consistency: Thin  Diet effective now               Disposition Plan  :  SNF.  Barriers to discharge: Hypoxia requiring O2 supplementation  Antimicorbials  :    Anti-infectives (From admission, onward)   Start     Dose/Rate Route Frequency Ordered Stop   07/15/19 1000  amoxicillin-clavulanate (AUGMENTIN) 500-125 MG per tablet 500 mg    Note to Pharmacy: Pharmacy can adjust for aspiration pneumonia, stop date 07/17/2019   1 tablet Oral 2 times daily 07/15/19 0754 07/18/19 0959   07/10/19 1000  ampicillin-sulbactam (UNASYN) 1.5 g in sodium chloride 0.9 % 100 mL IVPB  Status:  Discontinued     1.5 g 200 mL/hr over 30 Minutes Intravenous  Every 8 hours 07/10/19 0945 07/15/19 0754   07/04/19 1830  vancomycin (VANCOREADY) IVPB 750 mg/150 mL  Status:  Discontinued     750 mg 150 mL/hr over 60 Minutes Intravenous Every 48 hours 07/03/19 1249 07/04/19 1110   07/04/19 1800  vancomycin (VANCOCIN) IVPB 1000 mg/200 mL premix  Status:  Discontinued     1,000 mg 200 mL/hr over 60 Minutes Intravenous Every 48 hours 07/12/2019 1727 07/03/19 1249   07/03/19 1800  ceFEPIme (MAXIPIME) 1 g in sodium chloride 0.9 % 100 mL IVPB  Status:  Discontinued     1 g 200 mL/hr over 30 Minutes Intravenous Every 24 hours 06/27/2019 1725 07/03/19 1248   07/03/19 1800  ceFEPIme (MAXIPIME) 2 g in sodium chloride 0.9 % 100 mL IVPB     2 g 200 mL/hr over 30 Minutes Intravenous Every 24 hours 07/03/19 1247 07/08/19 1850   07/03/19 1000  remdesivir 100 mg in sodium chloride 0.9 % 100 mL IVPB     100 mg 200 mL/hr over 30 Minutes Intravenous Daily 07/17/2019 1723 07/06/19 0926   07/03/19 1000  remdesivir 100 mg in sodium chloride 0.9 % 100 mL IVPB  Status:  Discontinued     100 mg 200 mL/hr over 30 Minutes Intravenous Daily 07/08/2019 2355 07/03/19 0257   07/06/2019 2354  remdesivir 200 mg in sodium chloride 0.9% 250 mL IVPB  Status:  Discontinued     200 mg 580 mL/hr over 30 Minutes Intravenous Once 07/23/2019 2355 07/03/19 0257   06/27/2019 1800  remdesivir 200 mg in sodium chloride 0.9% 250 mL IVPB     200 mg 580 mL/hr over 30 Minutes Intravenous Once 07/22/2019 1723 07/11/2019 2306   07/06/2019 1730  vancomycin (VANCOCIN) IVPB 1000 mg/200 mL premix  Status:  Discontinued     1,000 mg 200 mL/hr over 60 Minutes Intravenous  Once 07/26/2019 1722 07/20/2019 1725   07/08/2019 1730  ceFEPIme (MAXIPIME) 2 g in sodium chloride 0.9 % 100 mL IVPB     2 g 200 mL/hr over 30 Minutes Intravenous  Once 07/10/2019 1722 07/10/2019 1918   07/17/2019 1730  vancomycin (VANCOREADY) IVPB 1500 mg/300 mL     1,500 mg 150 mL/hr over 120 Minutes Intravenous  Once 07/06/2019 1725 07/24/2019 2036      DVT  Prophylaxis  : Xarelto  Inpatient Medications  Scheduled Meds:  sodium chloride   Intravenous Once   amoxicillin-clavulanate  1 tablet Oral BID   vitamin C  500 mg Oral Daily   fluticasone furoate-vilanterol  2 puff Inhalation Daily   insulin aspart  0-9 Units Subcutaneous TID WC   levothyroxine  100 mcg Oral QAC breakfast   metoprolol succinate  50 mg Oral Daily   pantoprazole  40 mg Oral BID   pravastatin  10 mg Oral Daily   Rivaroxaban  15 mg Oral Q breakfast   sodium zirconium cyclosilicate  10 g Oral Daily   zinc sulfate  220 mg Oral Daily   Continuous Infusions:  PRN Meds:.acetaminophen, albuterol, chlorpheniramine-HYDROcodone, guaiFENesin-dextromethorphan, lip balm, Melatonin, senna-docusate   Time Spent in minutes 25  See all Orders from today for further details   Lala Lund M.D on 07/16/2019 at 9:54 AM  To page go to [email or web address] - use universal password  [facility] -  Office  [phone]    Objective:   Vitals:   07/15/19 1935 07/15/19 2332 07/16/19 0325 07/16/19 0730  BP: 122/79 108/68 123/87 127/81  Pulse: 73 71 71 70  Resp: 20  18   Temp: 97.8 F (36.6 C) 97.6 F (36.4  C) 97.9 F (36.6 C) 97.7 F (36.5 C)  TempSrc: Oral Oral Axillary Oral  SpO2: (!) 87% 93% 92% 91%  Weight:      Height:        Wt Readings from Last 3 Encounters:  07/27/2019 59.9 kg  04/01/19 65.8 kg  04/01/19 64.4 kg     Intake/Output Summary (Last 24 hours) at 07/16/2019 0954 Last data filed at 07/16/2019 0400 Gross per 24 hour  Intake 1560 ml  Output 950 ml  Net 610 ml     Physical Exam  Awake Alert , No new F.N deficits, Normal affect [place].AT,PERRAL Supple Neck,No JVD, No cervical lymphadenopathy appriciated.  Symmetrical Chest wall movement, Good air movement bilaterally, +ve rales RRR,No Gallops, Rubs or new Murmurs, No Parasternal Heave +ve B.Sounds, Abd Soft, No tenderness, No organomegaly appriciated, No rebound - guarding or  rigidity. No Cyanosis, Clubbing or edema, No new Rash or bruise     Data Review:    CBC Recent Labs  Lab 07/12/19 0150 07/13/19 0145 07/14/19 0138 07/15/19 0600 07/16/19 0506  WBC 16.5* 17.2* 16.3* 14.8* 15.4*  HGB 14.4 14.5 14.6 14.7 14.5  HCT 45.8 45.0 45.4 46.2* 45.4  PLT 199 202 192 163 147*  MCV 97.2 96.2 96.4 97.3 100.0  MCH 30.6 31.0 31.0 30.9 31.9  MCHC 31.4 32.2 32.2 31.8 31.9  RDW 14.3 14.3 14.0 14.2 14.4  LYMPHSABS  --   --  1.4 1.1 0.9  MONOABS  --   --  1.0 0.8 0.6  EOSABS  --   --  0.2 0.1 0.2  BASOSABS  --   --  0.1 0.1 0.1    Chemistries  Recent Labs  Lab 07/12/19 0150 07/13/19 0145 07/14/19 0138 07/15/19 0600 07/16/19 0506  NA 139 141 138 141 140  K 4.4 4.5 4.4 4.0 4.0  CL 107 106 102 100 96*  CO2 22 23 24 31  32  GLUCOSE 137* 128* 137* 169* 295*  BUN 49* 56* 55* 49* 58*  CREATININE 1.24* 1.34* 1.36* 1.26* 1.24*  CALCIUM 8.8* 8.8* 8.7* 8.8* 8.9  MG  --   --  1.8 2.1 2.0  AST 22 27 45* 38 50*  ALT 21 25 55* 56* 74*  ALKPHOS 127* 144* 197* 214* 254*  BILITOT 1.1 1.4* 1.2 1.0 0.8   ------------------------------------------------------------------------------------------------------------------ No results for input(s): CHOL, HDL, LDLCALC, TRIG, CHOLHDL, LDLDIRECT in the last 72 hours.  Lab Results  Component Value Date   HGBA1C 6.8 (H) 07/06/2019   ------------------------------------------------------------------------------------------------------------------ No results for input(s): TSH, T4TOTAL, T3FREE, THYROIDAB in the last 72 hours.  Invalid input(s): FREET3 ------------------------------------------------------------------------------------------------------------------ No results for input(s): VITAMINB12, FOLATE, FERRITIN, TIBC, IRON, RETICCTPCT in the last 72 hours.  Coagulation profile No results for input(s): INR, PROTIME in the last 168 hours.  Recent Labs    07/15/19 0600 07/16/19 0506  DDIMER 1.38* 0.95*    Cardiac  Enzymes No results for input(s): CKMB, TROPONINI, MYOGLOBIN in the last 168 hours.  Invalid input(s): CK ------------------------------------------------------------------------------------------------------------------    Component Value Date/Time   BNP 449.1 (H) 07/16/2019 [id number]    Micro Results No results found for this or any previous visit (from the past 240 hour(s)).  Radiology Reports DG Chest Port 1 View  Result Date: 07/16/2019 CLINICAL DATA:  Shortness of breath. EXAM: PORTABLE CHEST 1 VIEW COMPARISON:  07/09/2019 and CT chest 06/12/2017. FINDINGS: Trachea is midline. Heart is enlarged, stable. Pacemaker lead tips are stable in position. Worsening diffuse bilateral airspace opacification with left lower  lobe consolidation. No definite pleural fluid. Thyroidectomy clips. Probable bone infarct or enchondroma in the proximal right humerus. IMPRESSION: Worsening bilateral airspace opacification, consistent with worsening COVID-19 pneumonia. Electronically Signed   By: Lorin Picket M.D.   On: 07/16/2019 09:04   DG CHEST PORT 1 VIEW  Result Date: 07/09/2019 CLINICAL DATA:  Shortness of breath EXAM: PORTABLE CHEST 1 VIEW COMPARISON:  07/06/2019 FINDINGS: Patchy bilateral airspace disease again noted, worsening since prior study, particularly in the left lower lobe. Cardiomegaly. Pacer remains in place, unchanged. No effusions or pneumothorax. No acute bony abnormality. IMPRESSION: Patchy bilateral airspace opacities, worsening since prior study, particularly in the left lower lobe. Electronically Signed   By: Rolm Baptise M.D.   On: 07/09/2019 21:47   DG Chest Port 1 View  Result Date: 07/03/2019 CLINICAL DATA:  COVID-19 positive.  Worsening shortness of breath. EXAM: PORTABLE CHEST 1 VIEW COMPARISON:  07/15/2019. FINDINGS: Surgical clips noted over the neck. AICD in stable position. Stable cardiomegaly. Diffuse severe bilateral pulmonary infiltrates again noted. Slight improvement in  aeration on today's exam. No pleural effusion pneumothorax. Old infarct proximal right humerus. No acute bony abnormality. IMPRESSION: 1.  AICD in stable position.  Stable cardiomegaly. 2. Diffuse severe bilateral pulmonary infiltrates again noted. Slight improvement in aeration on today's exam. Electronically Signed   By: Marcello Moores  Register   On: 07/03/2019 07:15   DG Chest Port 1 View  Result Date: 07/15/2019 CLINICAL DATA:  Hypoxia. EXAM: PORTABLE CHEST 1 VIEW COMPARISON:  None. FINDINGS: There is a dual lead AICD. Moderate severity diffuse bilateral infiltrates are seen. There is a small right pleural effusion. No pneumothorax is identified. The cardiac silhouette is markedly enlarged. Multiple radiopaque surgical clips are seen overlying the superior mediastinum. There is marked severity levoscoliosis of the thoracic spine with moderate severity multilevel degenerative changes. IMPRESSION: 1. Moderate severity diffuse bilateral infiltrates with a small right pleural effusion. 2. Marked enlargement of the cardiac silhouette. 3. Dual lead AICD in place. Electronically Signed   By: Virgina Norfolk M.D.   On: 07/08/2019 15:50   DG Chest Port 1V today  Result Date: 07/06/2019 CLINICAL DATA:  Dyspnea, COVID-19 positive EXAM: PORTABLE CHEST 1 VIEW COMPARISON:  07/03/2019 chest radiograph. FINDINGS: Stable configuration of 2 lead left subclavian pacemaker. Surgical clips overlie the central lower neck. Stable cardiomediastinal silhouette with mild cardiomegaly. No pneumothorax. No pleural effusion. Patchy hazy opacities throughout the mid to lower lungs bilaterally, similar to minimally improved. IMPRESSION: Patchy hazy opacities throughout the mid to lower lungs bilaterally, similar to minimally improved, compatible with COVID-19 pneumonia. Stable cardiomegaly. Electronically Signed   By: Ilona Sorrel M.D.   On: 07/06/2019 13:09

## 2019-07-17 LAB — GLUCOSE, CAPILLARY
Glucose-Capillary: 208 mg/dL — ABNORMAL HIGH (ref 70–99)
Glucose-Capillary: 323 mg/dL — ABNORMAL HIGH (ref 70–99)
Glucose-Capillary: 344 mg/dL — ABNORMAL HIGH (ref 70–99)
Glucose-Capillary: 151 mg/dL — ABNORMAL HIGH (ref 70–99)

## 2019-07-17 LAB — CBC WITH DIFFERENTIAL/PLATELET
Abs Immature Granulocytes: 0.19 10*3/uL — ABNORMAL HIGH (ref 0.00–0.07)
Basophils Absolute: 0 10*3/uL (ref 0.0–0.1)
Basophils Relative: 0 %
Eosinophils Absolute: 0 10*3/uL (ref 0.0–0.5)
Eosinophils Relative: 0 %
HCT: 45.9 % (ref 36.0–46.0)
Hemoglobin: 14.9 g/dL (ref 12.0–15.0)
Immature Granulocytes: 1 %
Lymphocytes Relative: 4 %
Lymphs Abs: 0.7 10*3/uL (ref 0.7–4.0)
MCH: 31.8 pg (ref 26.0–34.0)
MCHC: 32.5 g/dL (ref 30.0–36.0)
MCV: 98.1 fL (ref 80.0–100.0)
Monocytes Absolute: 0.4 10*3/uL (ref 0.1–1.0)
Monocytes Relative: 3 %
Neutro Abs: 14.9 10*3/uL — ABNORMAL HIGH (ref 1.7–7.7)
Neutrophils Relative %: 92 %
Platelets: 148 10*3/uL — ABNORMAL LOW (ref 150–400)
RBC: 4.68 MIL/uL (ref 3.87–5.11)
RDW: 14.2 % (ref 11.5–15.5)
WBC: 16.2 10*3/uL — ABNORMAL HIGH (ref 4.0–10.5)
nRBC: 0 % (ref 0.0–0.2)

## 2019-07-17 LAB — COMPREHENSIVE METABOLIC PANEL
ALT: 67 U/L — ABNORMAL HIGH (ref 0–44)
AST: 32 U/L (ref 15–41)
Albumin: 2.6 g/dL — ABNORMAL LOW (ref 3.5–5.0)
Alkaline Phosphatase: 234 U/L — ABNORMAL HIGH (ref 38–126)
Anion gap: 12 (ref 5–15)
BUN: 61 mg/dL — ABNORMAL HIGH (ref 8–23)
CO2: 32 mmol/L (ref 22–32)
Calcium: 9.1 mg/dL (ref 8.9–10.3)
Chloride: 93 mmol/L — ABNORMAL LOW (ref 98–111)
Creatinine, Ser: 1.17 mg/dL — ABNORMAL HIGH (ref 0.44–1.00)
GFR calc Af Amer: 57 mL/min — ABNORMAL LOW (ref >60)
GFR calc non Af Amer: 50 mL/min — ABNORMAL LOW (ref >60)
Glucose, Bld: 238 mg/dL — ABNORMAL HIGH (ref 70–99)
Potassium: 4.4 mmol/L (ref 3.5–5.1)
Sodium: 137 mmol/L (ref 135–145)
Total Bilirubin: 1.1 mg/dL (ref 0.3–1.2)
Total Protein: 6.7 g/dL (ref 6.5–8.1)

## 2019-07-17 LAB — BRAIN NATRIURETIC PEPTIDE: B Natriuretic Peptide: 618.1 pg/mL — ABNORMAL HIGH (ref 0.0–100.0)

## 2019-07-17 LAB — D-DIMER, QUANTITATIVE: D-Dimer, Quant: 0.8 ug/mL-FEU — ABNORMAL HIGH (ref 0.00–0.50)

## 2019-07-17 LAB — PROCALCITONIN: Procalcitonin: 0.12 ng/mL

## 2019-07-17 LAB — MAGNESIUM: Magnesium: 2 mg/dL (ref 1.7–2.4)

## 2019-07-17 LAB — C-REACTIVE PROTEIN: CRP: 2 mg/dL — ABNORMAL HIGH (ref <1.0)

## 2019-07-17 MED ORDER — INSULIN GLARGINE 100 UNIT/ML ~~LOC~~ SOLN
10.0000 [IU] | Freq: Every day | SUBCUTANEOUS | Status: DC
  Administered 2019-07-17 – 2019-07-20 (×4): 10 [IU] via SUBCUTANEOUS
  Filled 2019-07-17 (×5): qty 0.1

## 2019-07-17 MED ORDER — INSULIN ASPART 100 UNIT/ML ~~LOC~~ SOLN
0.0000 [IU] | Freq: Every day | SUBCUTANEOUS | Status: DC
  Administered 2019-07-18 – 2019-07-20 (×2): 3 [IU] via SUBCUTANEOUS

## 2019-07-17 MED ORDER — FUROSEMIDE 10 MG/ML IJ SOLN
60.0000 mg | Freq: Once | INTRAMUSCULAR | Status: AC
  Administered 2019-07-17: 60 mg via INTRAVENOUS
  Filled 2019-07-17: qty 6

## 2019-07-17 MED ORDER — INSULIN ASPART 100 UNIT/ML ~~LOC~~ SOLN
0.0000 [IU] | Freq: Three times a day (TID) | SUBCUTANEOUS | Status: DC
  Administered 2019-07-17 (×2): 11 [IU] via SUBCUTANEOUS
  Administered 2019-07-18: 3 [IU] via SUBCUTANEOUS
  Administered 2019-07-18: 2 [IU] via SUBCUTANEOUS
  Administered 2019-07-18 – 2019-07-19 (×2): 3 [IU] via SUBCUTANEOUS
  Administered 2019-07-19: 09:00:00 2 [IU] via SUBCUTANEOUS
  Administered 2019-07-19: 15 [IU] via SUBCUTANEOUS
  Administered 2019-07-20: 12:00:00 5 [IU] via SUBCUTANEOUS
  Administered 2019-07-20: 17:00:00 8 [IU] via SUBCUTANEOUS

## 2019-07-17 NOTE — Progress Notes (Signed)
Inpatient Rehabilitation Admissions Coordinator  Feel pt will need SNF when pt medically ready for d/c. I have alerted SW, Caryl Pina of my recommendation. HF [place] 25 L with activity with OT. I will follow at a distance in case she shows improvement.  Danne Baxter, RN, MSN Rehab Admissions Coordinator [phone] 07/17/2019 4:57 PM

## 2019-07-17 NOTE — Plan of Care (Signed)

## 2019-07-17 NOTE — Progress Notes (Signed)
PROGRESS NOTE                                                                                                                                                                                                             Patient Demographics:    [name], is a 64 y.o. female, DOB - 1955-09-23, DH:[id number]  Outpatient Primary MD for the patient is Celene Squibb, MD   Admit date - 07/08/2019   LOS - 51  Chief Complaint  Patient presents with   Shortness of Breath   covid pos       Brief Narrative:  Patient is a 64 y.o. 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    Post admission-she had significant worsening of her hypoxemia-requiring 100% NRB-she was then transferred to [facility].  Hypoxia gradually improved with steroids/remdesivir and empiric antibiotics-however on 1/12-1/13-hypoxia briefly worsened-chest x-ray showed a new left-sided infiltrate.  Hospital course was also complicated by development of AKI and hypernatremia.  Patient was provided supportive care with IV fluids, and was restarted on IV antibiotics (Unasyn)-with further improvement of hypoxemia.  See below for further details.    COVID-19 Medications:  Steroids: 1/6>> 1/15 Remdesivir: 1/6>>1/10 Convalescent Plasma: 1/7   Antibiotics: Unasyn: 1/14>> Cefepime: 1/6>>1/12 Vancomycin: 1/6>>1/8   Subjective:   Patient in bed, appears comfortable, denies any headache, no fever, no chest pain or pressure,  Improved shortness of breath , no abdominal  pain. No focal weakness.    Assessment  & Plan :   Acute Hypoxic Resp Failure due to Covid 19 Viral pneumonia and concurrent bacterial pneumonia: she had severe COVID-19 pneumonia along with bacterial pneumonia, she has been treated so far with IV steroids, remdesivir and convalescent plasma for her COVID-19 infection along with vancomycin, cefepime and subsequently Unasyn/Augmentin.  For the bacterial pneumonia.  She is gradually improving - responded well to Lasix and will give another dose IV on 1/20.  We had titrated her steroids off however her progress has not gone well the last  3 days in fact her oxygen demand is gradually increasing again hence we will rechallenge her with IV steroids again on 07/17/2019.  Encouraged to sit up in chair in the daytime use I-S and flutter valve for pulmonary toiletry and prone in bed at night if possible.  Also involve speech therapy to rule out ongoing aspiration.    O2 requirements:  SpO2: 97 % O2 Flow Rate (L/min): 15 L/min   COVID-19 Labs: Recent Labs    07/15/19 0556 07/15/19 0600 07/16/19 0506 07/17/19 0338  DDIMER  --  1.38* 0.95* 0.80*  CRP 1.1*  --  2.1* 2.0*       Component Value Date/Time   BNP 618.1 (H) 07/17/2019 0338    Recent Labs  Lab 07/14/19 0138 07/15/19 0600 07/16/19 0506 07/17/19 0338  PROCALCITON 0.14 0.15 0.14 0.12    Lab Results  Component Value Date   SARSCOV2NAA Detected (A) 06/26/2019   [place] NEGATIVE 02/20/2019   [place] Not Detected 12/18/2018    Prone/Incentive Spirometry: encouraged  incentive spirometry use 3-4/hour.   AKI on CKD stage IIIa: Likely due to fluid overload improving with Lasix.  Acute metabolic encephalopathy: Does not appear to be confused-she is hard of hearing-and is following commands.  Suspect mild metabolic encephalopathy was from hypoxemia, hyponatremia and AKI.    HTN: BP controlled-continue metoprolol  Persistent atrial fibrillation s/p AV node ablation and  PPM insertion: Continue metoprolol-on Xarelto.  Rate under good control.  Paced rhythm on telemetry.  Note has history of amiodarone induced lung toxicity  Chronic combined systolic and diastolic heart failure percent on echocardiogram in 02/2019: Dense of mild fluid overload, gentle Lasix again on 07/15/2019.  Bronchial asthma: Appears stable-no rhonchi-continue bronchodilators.  DM-2 (A1c 6.8): Has had hypoglycemic episodes a few days back-CBGs now trending back up-patient's appetite is seems to be slowly improving.  Start Lantus 10 units nightly-continue SSI and follow.  CBG (last 3)  Recent Labs    07/16/19 1607 07/16/19 2035 07/17/19 0817  GLUCAP 241* 298* 208*   Hypothyroidism: Continue levothyroxine  History of amiodarone induced lung toxicity/ILD: Supportive care.  Remote history of ASD repair  Deconditioning/debility: Appears very weak from acute illness-plans are for SNF when she is closer to discharge.    Consults  :  PCCM  Procedures  :  None  Condition - stable  Family Communication  : Brother updated on 07/16/2019. Prognosis, if" worsening despite aggressive medical treatment then comfort measures will be initiated.  DNR now.  Code Status :  DNR  Diet :  Diet Order            DIET DYS 3 Room service appropriate? Yes; Fluid consistency: Thin  Diet effective now               Disposition Plan  :  SNF.  Barriers to discharge: Hypoxia requiring O2 supplementation  Antimicorbials  :    Anti-infectives (From admission, onward)   Start     Dose/Rate Route Frequency Ordered Stop   07/15/19 1000  amoxicillin-clavulanate (AUGMENTIN) 500-125 MG per tablet 500 mg    Note to Pharmacy: Pharmacy can adjust for aspiration pneumonia, stop date 07/17/2019   1 tablet Oral 2 times daily 07/15/19 0754 07/18/19 0959   07/10/19 1000  ampicillin-sulbactam (UNASYN) 1.5 g in sodium chloride 0.9 % 100 mL IVPB  Status:  Discontinued     1.5 g 200 mL/hr over 30 Minutes  Intravenous Every 8 hours 07/10/19 0945 07/15/19 0754   07/04/19 1830  vancomycin (VANCOREADY) IVPB 750 mg/150 mL  Status:  Discontinued     750 mg 150 mL/hr over 60 Minutes Intravenous Every 48 hours 07/03/19 1249 07/04/19 1110   07/04/19 1800  vancomycin (VANCOCIN) IVPB 1000 mg/200 mL premix  Status:  Discontinued     1,000 mg 200 mL/hr over 60 Minutes Intravenous Every 48 hours 07/15/2019 1727 07/03/19 1249   07/03/19 1800  ceFEPIme (MAXIPIME) 1 g in sodium chloride 0.9 % 100 mL IVPB  Status:  Discontinued     1 g 200 mL/hr over 30 Minutes Intravenous Every 24 hours 07/04/2019 1725 07/03/19 1248   07/03/19 1800  ceFEPIme (MAXIPIME) 2 g in sodium chloride 0.9 % 100 mL IVPB     2 g 200 mL/hr over 30 Minutes Intravenous Every 24 hours 07/03/19 1247 07/08/19 1850   07/03/19 1000  remdesivir 100 mg in sodium chloride 0.9 % 100 mL IVPB     100 mg 200 mL/hr over 30 Minutes Intravenous Daily 07/26/2019 1723 07/06/19 0926   07/03/19 1000  remdesivir 100 mg in sodium chloride 0.9 % 100 mL IVPB  Status:  Discontinued     100 mg 200 mL/hr over 30 Minutes Intravenous Daily 06/27/2019 2355 07/03/19 0257   07/05/2019 2354  remdesivir 200 mg in sodium chloride 0.9% 250 mL IVPB  Status:  Discontinued     200 mg 580 mL/hr over 30 Minutes Intravenous Once 07/03/2019 2355 07/03/19 0257   07/12/2019 1800  remdesivir 200 mg in sodium chloride 0.9% 250 mL IVPB     200 mg 580 mL/hr over 30 Minutes Intravenous Once 07/11/2019 1723 07/06/2019 2306   07/08/2019 1730  vancomycin (VANCOCIN) IVPB 1000 mg/200 mL premix  Status:  Discontinued     1,000 mg 200 mL/hr over 60 Minutes Intravenous  Once 07/10/2019 1722 07/08/2019 1725   07/13/2019 1730  ceFEPIme (MAXIPIME) 2 g in sodium chloride 0.9 % 100 mL IVPB     2 g 200 mL/hr over 30 Minutes Intravenous  Once 06/28/2019 1722 07/26/2019 1918   07/11/2019 1730  vancomycin (VANCOREADY) IVPB 1500 mg/300 mL     1,500 mg 150 mL/hr over 120 Minutes Intravenous  Once 07/01/2019 1725 07/08/2019 2036       DVT Prophylaxis  : Xarelto  Inpatient Medications  Scheduled Meds:  sodium chloride   Intravenous Once   amoxicillin-clavulanate  1 tablet Oral BID   vitamin C  500 mg Oral Daily   fluticasone furoate-vilanterol  2 puff Inhalation Daily   insulin aspart  0-9 Units Subcutaneous TID WC   levothyroxine  100 mcg Oral QAC breakfast   methylPREDNISolone (SOLU-MEDROL) injection  60 mg Intravenous Daily   metoprolol succinate  50 mg Oral Daily   pantoprazole  40 mg Oral BID   pravastatin  10 mg Oral Daily   Rivaroxaban  15 mg Oral Q breakfast   sodium zirconium cyclosilicate  10 g Oral Daily   zinc sulfate  220 mg Oral Daily   Continuous Infusions:  PRN Meds:.acetaminophen, albuterol, chlorpheniramine-HYDROcodone, guaiFENesin-dextromethorphan, lip balm, Melatonin, senna-docusate   Time Spent in minutes 25  See all Orders from today for further details   Lala Lund M.D on 07/17/2019 at 10:10 AM  To page go to [email or web address] - use universal password  [facility] -  Office  [phone]    Objective:   Vitals:   07/16/19 1649 07/16/19 2021 07/17/19 0008 07/17/19 0748  BP: 122/78   130/81  Pulse: 71   76  Resp:  20 16  Temp: 97.9 F (36.6 C) (!) 97.2 F (36.2 C) (!) 97 F (36.1 C) 97.7 F (36.5 C)  TempSrc: Axillary Oral Axillary Axillary  SpO2: (!) 85% 91%  97%  Weight:      Height:        Wt Readings from Last 3 Encounters:  06/29/2019 59.9 kg  04/01/19 65.8 kg  04/01/19 64.4 kg     Intake/Output Summary (Last 24 hours) at 07/17/2019 1010 Last data filed at 07/17/2019 0500 Gross per 24 hour  Intake 240 ml  Output 100 ml  Net 140 ml     Physical Exam  Awake Alert,  No new F.N deficits, Normal affect [place].AT,PERRAL Supple Neck,No JVD, No cervical lymphadenopathy appriciated.  Symmetrical Chest wall movement, Good air movement bilaterally, CTAB RRR,No Gallops, Rubs or new Murmurs, No Parasternal Heave +ve B.Sounds, Abd Soft, No  tenderness, No organomegaly appriciated, No rebound - guarding or rigidity. No Cyanosis, Clubbing or edema, No new Rash or bruise   Data Review:    CBC Recent Labs  Lab 07/13/19 0145 07/14/19 0138 07/15/19 0600 07/16/19 0506 07/17/19 0338  WBC 17.2* 16.3* 14.8* 15.4* 16.2*  HGB 14.5 14.6 14.7 14.5 14.9  HCT 45.0 45.4 46.2* 45.4 45.9  PLT 202 192 163 147* 148*  MCV 96.2 96.4 97.3 100.0 98.1  MCH 31.0 31.0 30.9 31.9 31.8  MCHC 32.2 32.2 31.8 31.9 32.5  RDW 14.3 14.0 14.2 14.4 14.2  LYMPHSABS  --  1.4 1.1 0.9 0.7  MONOABS  --  1.0 0.8 0.6 0.4  EOSABS  --  0.2 0.1 0.2 0.0  BASOSABS  --  0.1 0.1 0.1 0.0    Chemistries  Recent Labs  Lab 07/13/19 0145 07/14/19 0138 07/15/19 0600 07/16/19 0506 07/17/19 0338  NA 141 138 141 140 137  K 4.5 4.4 4.0 4.0 4.4  CL 106 102 100 96* 93*  CO2 23 24 31  32 32  GLUCOSE 128* 137* 169* 295* 238*  BUN 56* 55* 49* 58* 61*  CREATININE 1.34* 1.36* 1.26* 1.24* 1.17*  CALCIUM 8.8* 8.7* 8.8* 8.9 9.1  MG  --  1.8 2.1 2.0 2.0  AST 27 45* 38 50* 32  ALT 25 55* 56* 74* 67*  ALKPHOS 144* 197* 214* 254* 234*  BILITOT 1.4* 1.2 1.0 0.8 1.1   ------------------------------------------------------------------------------------------------------------------ No results for input(s): CHOL, HDL, LDLCALC, TRIG, CHOLHDL, LDLDIRECT in the last 72 hours.  Lab Results  Component Value Date   HGBA1C 6.8 (H) 07/06/2019   ------------------------------------------------------------------------------------------------------------------ No results for input(s): TSH, T4TOTAL, T3FREE, THYROIDAB in the last 72 hours.  Invalid input(s): FREET3 ------------------------------------------------------------------------------------------------------------------ No results for input(s): VITAMINB12, FOLATE, FERRITIN, TIBC, IRON, RETICCTPCT in the last 72 hours.  Coagulation profile No results for input(s): INR, PROTIME in the last 168 hours.  Recent Labs     07/16/19 0506 07/17/19 0338  DDIMER 0.95* 0.80*    Cardiac Enzymes No results for input(s): CKMB, TROPONINI, MYOGLOBIN in the last 168 hours.  Invalid input(s): CK ------------------------------------------------------------------------------------------------------------------    Component Value Date/Time   BNP 618.1 (H) 07/17/2019 [id number]    Micro Results No results found for this or any previous visit (from the past 240 hour(s)).  Radiology Reports DG Chest Port 1 View  Result Date: 07/16/2019 CLINICAL DATA:  Shortness of breath. EXAM: PORTABLE CHEST 1 VIEW COMPARISON:  07/09/2019 and CT chest 06/12/2017. FINDINGS: Trachea is midline. Heart is enlarged, stable. Pacemaker lead tips are stable in position. Worsening diffuse bilateral airspace opacification with left lower lobe consolidation. No  definite pleural fluid. Thyroidectomy clips. Probable bone infarct or enchondroma in the proximal right humerus. IMPRESSION: Worsening bilateral airspace opacification, consistent with worsening COVID-19 pneumonia. Electronically Signed   By: Lorin Picket M.D.   On: 07/16/2019 09:04   DG CHEST PORT 1 VIEW  Result Date: 07/09/2019 CLINICAL DATA:  Shortness of breath EXAM: PORTABLE CHEST 1 VIEW COMPARISON:  07/06/2019 FINDINGS: Patchy bilateral airspace disease again noted, worsening since prior study, particularly in the left lower lobe. Cardiomegaly. Pacer remains in place, unchanged. No effusions or pneumothorax. No acute bony abnormality. IMPRESSION: Patchy bilateral airspace opacities, worsening since prior study, particularly in the left lower lobe. Electronically Signed   By: Rolm Baptise M.D.   On: 07/09/2019 21:47   DG Chest Port 1 View  Result Date: 07/03/2019 CLINICAL DATA:  COVID-19 positive.  Worsening shortness of breath. EXAM: PORTABLE CHEST 1 VIEW COMPARISON:  07/25/2019. FINDINGS: Surgical clips noted over the neck. AICD in stable position. Stable cardiomegaly. Diffuse severe  bilateral pulmonary infiltrates again noted. Slight improvement in aeration on today's exam. No pleural effusion pneumothorax. Old infarct proximal right humerus. No acute bony abnormality. IMPRESSION: 1.  AICD in stable position.  Stable cardiomegaly. 2. Diffuse severe bilateral pulmonary infiltrates again noted. Slight improvement in aeration on today's exam. Electronically Signed   By: Marcello Moores  Register   On: 07/03/2019 07:15   DG Chest Port 1 View  Result Date: 06/28/2019 CLINICAL DATA:  Hypoxia. EXAM: PORTABLE CHEST 1 VIEW COMPARISON:  None. FINDINGS: There is a dual lead AICD. Moderate severity diffuse bilateral infiltrates are seen. There is a small right pleural effusion. No pneumothorax is identified. The cardiac silhouette is markedly enlarged. Multiple radiopaque surgical clips are seen overlying the superior mediastinum. There is marked severity levoscoliosis of the thoracic spine with moderate severity multilevel degenerative changes. IMPRESSION: 1. Moderate severity diffuse bilateral infiltrates with a small right pleural effusion. 2. Marked enlargement of the cardiac silhouette. 3. Dual lead AICD in place. Electronically Signed   By: Virgina Norfolk M.D.   On: 07/17/2019 15:50   DG Chest Port 1V today  Result Date: 07/06/2019 CLINICAL DATA:  Dyspnea, COVID-19 positive EXAM: PORTABLE CHEST 1 VIEW COMPARISON:  07/03/2019 chest radiograph. FINDINGS: Stable configuration of 2 lead left subclavian pacemaker. Surgical clips overlie the central lower neck. Stable cardiomediastinal silhouette with mild cardiomegaly. No pneumothorax. No pleural effusion. Patchy hazy opacities throughout the mid to lower lungs bilaterally, similar to minimally improved. IMPRESSION: Patchy hazy opacities throughout the mid to lower lungs bilaterally, similar to minimally improved, compatible with COVID-19 pneumonia. Stable cardiomegaly. Electronically Signed   By: Ilona Sorrel M.D.   On: 07/06/2019 13:09

## 2019-07-18 DIAGNOSIS — I130 Hypertensive heart and chronic kidney disease with heart failure and stage 1 through stage 4 chronic kidney disease, or unspecified chronic kidney disease: Secondary | ICD-10-CM | POA: Diagnosis not present

## 2019-07-18 DIAGNOSIS — E039 Hypothyroidism, unspecified: Secondary | ICD-10-CM | POA: Diagnosis not present

## 2019-07-18 DIAGNOSIS — E1122 Type 2 diabetes mellitus with diabetic chronic kidney disease: Secondary | ICD-10-CM | POA: Diagnosis not present

## 2019-07-18 DIAGNOSIS — M109 Gout, unspecified: Secondary | ICD-10-CM | POA: Diagnosis not present

## 2019-07-18 DIAGNOSIS — N184 Chronic kidney disease, stage 4 (severe): Secondary | ICD-10-CM | POA: Diagnosis not present

## 2019-07-18 DIAGNOSIS — E1121 Type 2 diabetes mellitus with diabetic nephropathy: Secondary | ICD-10-CM | POA: Diagnosis not present

## 2019-07-18 DIAGNOSIS — E782 Mixed hyperlipidemia: Secondary | ICD-10-CM | POA: Diagnosis not present

## 2019-07-18 DIAGNOSIS — R000 Tachycardia, unspecified: Secondary | ICD-10-CM | POA: Diagnosis not present

## 2019-07-18 DIAGNOSIS — I10 Essential (primary) hypertension: Secondary | ICD-10-CM | POA: Diagnosis not present

## 2019-07-18 DIAGNOSIS — I4819 Other persistent atrial fibrillation: Secondary | ICD-10-CM | POA: Diagnosis not present

## 2019-07-18 DIAGNOSIS — I5042 Chronic combined systolic (congestive) and diastolic (congestive) heart failure: Secondary | ICD-10-CM | POA: Diagnosis not present

## 2019-07-18 LAB — COMPREHENSIVE METABOLIC PANEL
ALT: 59 U/L — ABNORMAL HIGH (ref 0–44)
AST: 29 U/L (ref 15–41)
Albumin: 2.7 g/dL — ABNORMAL LOW (ref 3.5–5.0)
Alkaline Phosphatase: 223 U/L — ABNORMAL HIGH (ref 38–126)
Anion gap: 13 (ref 5–15)
BUN: 66 mg/dL — ABNORMAL HIGH (ref 8–23)
CO2: 34 mmol/L — ABNORMAL HIGH (ref 22–32)
Calcium: 9.1 mg/dL (ref 8.9–10.3)
Chloride: 94 mmol/L — ABNORMAL LOW (ref 98–111)
Creatinine, Ser: 1.13 mg/dL — ABNORMAL HIGH (ref 0.44–1.00)
GFR calc Af Amer: 60 mL/min — ABNORMAL LOW (ref >60)
GFR calc non Af Amer: 52 mL/min — ABNORMAL LOW (ref >60)
Glucose, Bld: 93 mg/dL (ref 70–99)
Potassium: 3.6 mmol/L (ref 3.5–5.1)
Sodium: 141 mmol/L (ref 135–145)
Total Bilirubin: 1.3 mg/dL — ABNORMAL HIGH (ref 0.3–1.2)
Total Protein: 6.8 g/dL (ref 6.5–8.1)

## 2019-07-18 LAB — GLUCOSE, CAPILLARY
Glucose-Capillary: 122 mg/dL — ABNORMAL HIGH (ref 70–99)
Glucose-Capillary: 198 mg/dL — ABNORMAL HIGH (ref 70–99)
Glucose-Capillary: 189 mg/dL — ABNORMAL HIGH (ref 70–99)
Glucose-Capillary: 289 mg/dL — ABNORMAL HIGH (ref 70–99)

## 2019-07-18 LAB — CBC WITH DIFFERENTIAL/PLATELET
Abs Immature Granulocytes: 0.15 10*3/uL — ABNORMAL HIGH (ref 0.00–0.07)
Basophils Absolute: 0 10*3/uL (ref 0.0–0.1)
Basophils Relative: 0 %
Eosinophils Absolute: 0 10*3/uL (ref 0.0–0.5)
Eosinophils Relative: 0 %
HCT: 46.6 % — ABNORMAL HIGH (ref 36.0–46.0)
Hemoglobin: 14.9 g/dL (ref 12.0–15.0)
Immature Granulocytes: 1 %
Lymphocytes Relative: 5 %
Lymphs Abs: 0.8 10*3/uL (ref 0.7–4.0)
MCH: 31.2 pg (ref 26.0–34.0)
MCHC: 32 g/dL (ref 30.0–36.0)
MCV: 97.5 fL (ref 80.0–100.0)
Monocytes Absolute: 0.9 10*3/uL (ref 0.1–1.0)
Monocytes Relative: 6 %
Neutro Abs: 13.7 10*3/uL — ABNORMAL HIGH (ref 1.7–7.7)
Neutrophils Relative %: 88 %
Platelets: UNDETERMINED 10*3/uL (ref 150–400)
RBC: 4.78 MIL/uL (ref 3.87–5.11)
RDW: 14.2 % (ref 11.5–15.5)
WBC: 15.5 10*3/uL — ABNORMAL HIGH (ref 4.0–10.5)
nRBC: 0 % (ref 0.0–0.2)

## 2019-07-18 LAB — C-REACTIVE PROTEIN: CRP: 1.1 mg/dL — ABNORMAL HIGH (ref <1.0)

## 2019-07-18 LAB — D-DIMER, QUANTITATIVE: D-Dimer, Quant: 0.64 ug/mL-FEU — ABNORMAL HIGH (ref 0.00–0.50)

## 2019-07-18 LAB — BRAIN NATRIURETIC PEPTIDE: B Natriuretic Peptide: 479.6 pg/mL — ABNORMAL HIGH (ref 0.0–100.0)

## 2019-07-18 LAB — MAGNESIUM: Magnesium: 2.1 mg/dL (ref 1.7–2.4)

## 2019-07-18 MED ORDER — FUROSEMIDE 10 MG/ML IJ SOLN
40.0000 mg | Freq: Once | INTRAMUSCULAR | Status: AC
  Administered 2019-07-18: 40 mg via INTRAVENOUS
  Filled 2019-07-18: qty 4

## 2019-07-18 MED ORDER — METHYLPREDNISOLONE SODIUM SUCC 40 MG IJ SOLR
40.0000 mg | Freq: Every day | INTRAMUSCULAR | Status: DC
  Administered 2019-07-18 – 2019-07-21 (×4): 40 mg via INTRAVENOUS
  Filled 2019-07-18 (×4): qty 1

## 2019-07-18 MED ORDER — POTASSIUM CHLORIDE 20 MEQ PO PACK
40.0000 meq | PACK | Freq: Once | ORAL | Status: AC
  Administered 2019-07-18: 40 meq via ORAL
  Filled 2019-07-18: qty 2

## 2019-07-18 NOTE — Progress Notes (Signed)
PROGRESS NOTE                                                                                                                                                                                                             Patient Demographics:    [name], is a 64 y.o. female, DOB - August 18, 1955, [id number]  Outpatient Primary MD for the patient is Celene Squibb, MD   Admit date - 07/04/2019   LOS - 26  Chief Complaint  Patient presents with  . Shortness of Breath  . covid pos       Brief Narrative:  Patient is a 64 y.o. 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    Post admission-she had significant worsening of her hypoxemia-requiring 100% NRB-she was then transferred to [facility].  Hypoxia gradually improved with steroids/remdesivir and empiric antibiotics-however on 1/12-1/13-hypoxia briefly worsened-chest x-ray showed a new left-sided infiltrate.  Hospital course was also complicated by development of AKI and hypernatremia.  Patient was provided supportive care with IV fluids, and was restarted on IV antibiotics (Unasyn)-with further improvement of hypoxemia.  See below for further details.    COVID-19 Medications:  Steroids: 1/6>> 1/15 Remdesivir: 1/6>>1/10 Convalescent Plasma: 1/7   Antibiotics: Unasyn: 1/14>> Cefepime: 1/6>>1/12 Vancomycin: 1/6>>1/8   Subjective:   Patient in recliner having breakfast, appears short of breath, denies any headache chest or abdominal pain, says she is short of  breath.    Assessment  & Plan :   Acute Hypoxic Resp Failure due to Covid 19 Viral pneumonia and concurrent bacterial pneumonia with underlying amiodarone induced lung injury: she had severe COVID-19 pneumonia along with bacterial pneumonia, she has been treated so far with IV steroids, remdesivir and convalescent plasma for her COVID-19 infection along with vancomycin, cefepime and subsequently Unasyn/Augmentin.  She has finished her bacterial pneumonia treatment currently on steroids and Lasix as needed, overall still quite hypoxic, despite prolonged treatment she is not progressing as expected, gnosis is guarded, she is DNR, white have to look  into comfort measures soon if she does not turn around.  Brother has been informed in detail by me.  Encouraged to sit up in chair in the daytime use I-S and flutter valve for pulmonary toiletry and prone in bed at night if possible.  Also involve speech therapy to rule out ongoing aspiration.    O2 requirements:  SpO2: 92 % O2 Flow Rate (L/min): 10 L/min   COVID-19 Labs: Recent Labs    07/16/19 0506 07/17/19 0338 07/18/19 0128  DDIMER 0.95* 0.80* 0.64*  CRP 2.1* 2.0* 1.1*       Component Value Date/Time   BNP 479.6 (H) 07/18/2019 0128    Recent Labs  Lab 07/14/19 0138 07/15/19 0600 07/16/19 0506 07/17/19 0338  PROCALCITON 0.14 0.15 0.14 0.12    Lab Results  Component Value Date   SARSCOV2NAA Detected (A) 06/26/2019   [place] NEGATIVE 02/20/2019   [place] Not Detected 12/18/2018    Prone/Incentive Spirometry: encouraged  incentive spirometry use 3-4/hour.   AKI on CKD stage IIIa: Likely due to fluid overload improving with Lasix.  Acute metabolic encephalopathy: Does not appear to be confused-she is hard of hearing-and is following commands.  Suspect mild metabolic encephalopathy was from hypoxemia, hyponatremia and AKI.    HTN: BP controlled-continue metoprolol  Persistent atrial fibrillation s/p AV node  ablation and PPM insertion: Continue metoprolol-on Xarelto.  Rate under good control.  Paced rhythm on telemetry.  Note has history of amiodarone induced lung toxicity  Chronic combined systolic and diastolic heart failure percent on echocardiogram in 02/2019: Dense of mild fluid overload, gentle Lasix again on 07/15/2019.  Bronchial asthma: Appears stable-no rhonchi-continue bronchodilators.  DM-2 (A1c 6.8): Has had hypoglycemic episodes a few days back-CBGs now trending back up-patient's appetite is seems to be slowly improving.  Start Lantus 10 units nightly-continue SSI and follow.  CBG (last 3)  Recent Labs    07/17/19 1556 07/17/19 2050 07/18/19 0728  GLUCAP 344* 151* 122*   Hypothyroidism: Continue levothyroxine  History of amiodarone induced lung toxicity/ILD: Supportive care.  Remote history of ASD repair  Deconditioning/debility: Appears very weak from acute illness-plans are for SNF when she is closer to discharge.    Consults  :  PCCM  Procedures  :  None  Condition - stable  Family Communication  : Brother updated on 07/16/2019. Prognosis, if" worsening despite aggressive medical treatment then comfort measures will be initiated.  DNR now.  Code Status :  DNR  Diet :  Diet Order            DIET DYS 3 Room service appropriate? Yes; Fluid consistency: Thin  Diet effective now               Disposition Plan  :  SNF.  Barriers to discharge: Hypoxia requiring O2 supplementation  Antimicorbials  :    Anti-infectives (From admission, onward)   Start     Dose/Rate Route Frequency Ordered Stop   07/15/19 1000  amoxicillin-clavulanate (AUGMENTIN) 500-125 MG per tablet 500 mg    Note to Pharmacy: Pharmacy can adjust for aspiration pneumonia, stop date 07/17/2019   1 tablet Oral 2 times daily 07/15/19 0754 07/17/19 2100   07/10/19 1000  ampicillin-sulbactam (UNASYN) 1.5 g in sodium chloride 0.9 % 100 mL IVPB  Status:  Discontinued     1.5 g 200 mL/hr over 30  Minutes Intravenous Every 8 hours 07/10/19 0945 07/15/19 0754   07/04/19 1830  vancomycin (VANCOREADY) IVPB 750 mg/150 mL  Status:  Discontinued  750 mg 150 mL/hr over 60 Minutes Intravenous Every 48 hours 07/03/19 1249 07/04/19 1110   07/04/19 1800  vancomycin (VANCOCIN) IVPB 1000 mg/200 mL premix  Status:  Discontinued     1,000 mg 200 mL/hr over 60 Minutes Intravenous Every 48 hours 07/26/2019 1727 07/03/19 1249   07/03/19 1800  ceFEPIme (MAXIPIME) 1 g in sodium chloride 0.9 % 100 mL IVPB  Status:  Discontinued     1 g 200 mL/hr over 30 Minutes Intravenous Every 24 hours 06/28/2019 1725 07/03/19 1248   07/03/19 1800  ceFEPIme (MAXIPIME) 2 g in sodium chloride 0.9 % 100 mL IVPB     2 g 200 mL/hr over 30 Minutes Intravenous Every 24 hours 07/03/19 1247 07/08/19 1850   07/03/19 1000  remdesivir 100 mg in sodium chloride 0.9 % 100 mL IVPB     100 mg 200 mL/hr over 30 Minutes Intravenous Daily 07/01/2019 1723 07/06/19 0926   07/03/19 1000  remdesivir 100 mg in sodium chloride 0.9 % 100 mL IVPB  Status:  Discontinued     100 mg 200 mL/hr over 30 Minutes Intravenous Daily 07/11/2019 2355 07/03/19 0257   07/01/2019 2354  remdesivir 200 mg in sodium chloride 0.9% 250 mL IVPB  Status:  Discontinued     200 mg 580 mL/hr over 30 Minutes Intravenous Once 07/19/2019 2355 07/03/19 0257   07/23/2019 1800  remdesivir 200 mg in sodium chloride 0.9% 250 mL IVPB     200 mg 580 mL/hr over 30 Minutes Intravenous Once 07/20/2019 1723 07/26/2019 2306   07/07/2019 1730  vancomycin (VANCOCIN) IVPB 1000 mg/200 mL premix  Status:  Discontinued     1,000 mg 200 mL/hr over 60 Minutes Intravenous  Once 07/13/2019 1722 06/30/2019 1725   07/11/2019 1730  ceFEPIme (MAXIPIME) 2 g in sodium chloride 0.9 % 100 mL IVPB     2 g 200 mL/hr over 30 Minutes Intravenous  Once 07/03/2019 1722 06/29/2019 1918   07/22/2019 1730  vancomycin (VANCOREADY) IVPB 1500 mg/300 mL     1,500 mg 150 mL/hr over 120 Minutes Intravenous  Once 06/30/2019 1725 07/12/2019  2036      DVT Prophylaxis  : Xarelto  Inpatient Medications  Scheduled Meds: . sodium chloride   Intravenous Once  . vitamin C  500 mg Oral Daily  . fluticasone furoate-vilanterol  2 puff Inhalation Daily  . insulin aspart  0-15 Units Subcutaneous TID WC  . insulin aspart  0-5 Units Subcutaneous QHS  . insulin glargine  10 Units Subcutaneous Daily  . levothyroxine  100 mcg Oral QAC breakfast  . methylPREDNISolone (SOLU-MEDROL) injection  40 mg Intravenous Daily  . metoprolol succinate  50 mg Oral Daily  . pantoprazole  40 mg Oral BID  . pravastatin  10 mg Oral Daily  . Rivaroxaban  15 mg Oral Q breakfast  . sodium zirconium cyclosilicate  10 g Oral Daily  . zinc sulfate  220 mg Oral Daily   Continuous Infusions:  PRN Meds:.acetaminophen, albuterol, chlorpheniramine-HYDROcodone, guaiFENesin-dextromethorphan, lip balm, Melatonin, senna-docusate   Time Spent in minutes 25  See all Orders from today for further details   Lala Lund M.D on 07/18/2019 at 9:38 AM  To page go to [email or web address] - use universal password  [facility] -  Office  [phone]    Objective:   Vitals:   07/17/19 1600 07/17/19 1604 07/17/19 2022 07/18/19 0023  BP:  122/76  128/87  Pulse:  70  77  Resp:   20 20  Temp: 98.1  F (36.7 C) 98.1 F (36.7 C) (!) 97.5 F (36.4 C) (!) 97.3 F (36.3 C)  TempSrc: Oral Oral Oral Axillary  SpO2:  92% 92% 92%  Weight:      Height:        Wt Readings from Last 3 Encounters:  06/29/2019 59.9 kg  04/01/19 65.8 kg  04/01/19 64.4 kg     Intake/Output Summary (Last 24 hours) at 07/18/2019 0938 Last data filed at 07/18/2019 0500 Gross per 24 hour  Intake 480 ml  Output 400 ml  Net 80 ml     Physical Exam  Awake Alert,   No new F.N deficits, Normal affect [place].AT,PERRAL Supple Neck,No JVD, No cervical lymphadenopathy appriciated.  Symmetrical Chest wall movement, Good air movement bilaterally, few rales RRR,No Gallops, Rubs or new Murmurs,  No Parasternal Heave +ve B.Sounds, Abd Soft, No tenderness, No organomegaly appriciated, No rebound - guarding or rigidity. No Cyanosis, Clubbing or edema, No new Rash or bruise    Data Review:    CBC Recent Labs  Lab 07/14/19 0138 07/15/19 0600 07/16/19 0506 07/17/19 0338 07/18/19 0128  WBC 16.3* 14.8* 15.4* 16.2* 15.5*  HGB 14.6 14.7 14.5 14.9 14.9  HCT 45.4 46.2* 45.4 45.9 46.6*  PLT 192 163 147* 148* PLATELET CLUMPS NOTED ON SMEAR, UNABLE TO ESTIMATE  MCV 96.4 97.3 100.0 98.1 97.5  MCH 31.0 30.9 31.9 31.8 31.2  MCHC 32.2 31.8 31.9 32.5 32.0  RDW 14.0 14.2 14.4 14.2 14.2  LYMPHSABS 1.4 1.1 0.9 0.7 0.8  MONOABS 1.0 0.8 0.6 0.4 0.9  EOSABS 0.2 0.1 0.2 0.0 0.0  BASOSABS 0.1 0.1 0.1 0.0 0.0    Chemistries  Recent Labs  Lab 07/14/19 0138 07/15/19 0600 07/16/19 0506 07/17/19 0338 07/18/19 0128  NA 138 141 140 137 141  K 4.4 4.0 4.0 4.4 3.6  CL 102 100 96* 93* 94*  CO2 24 31 32 32 34*  GLUCOSE 137* 169* 295* 238* 93  BUN 55* 49* 58* 61* 66*  CREATININE 1.36* 1.26* 1.24* 1.17* 1.13*  CALCIUM 8.7* 8.8* 8.9 9.1 9.1  MG 1.8 2.1 2.0 2.0 2.1  AST 45* 38 50* 32 29  ALT 55* 56* 74* 67* 59*  ALKPHOS 197* 214* 254* 234* 223*  BILITOT 1.2 1.0 0.8 1.1 1.3*   ------------------------------------------------------------------------------------------------------------------ No results for input(s): CHOL, HDL, LDLCALC, TRIG, CHOLHDL, LDLDIRECT in the last 72 hours.  Lab Results  Component Value Date   HGBA1C 6.8 (H) 07/06/2019   ------------------------------------------------------------------------------------------------------------------ No results for input(s): TSH, T4TOTAL, T3FREE, THYROIDAB in the last 72 hours.  Invalid input(s): FREET3 ------------------------------------------------------------------------------------------------------------------ No results for input(s): VITAMINB12, FOLATE, FERRITIN, TIBC, IRON, RETICCTPCT in the last 72 hours.  Coagulation  profile No results for input(s): INR, PROTIME in the last 168 hours.  Recent Labs    07/17/19 0338 07/18/19 0128  DDIMER 0.80* 0.64*    Cardiac Enzymes No results for input(s): CKMB, TROPONINI, MYOGLOBIN in the last 168 hours.  Invalid input(s): CK ------------------------------------------------------------------------------------------------------------------    Component Value Date/Time   BNP 479.6 (H) 07/18/2019 0128    Micro Results No results found for this or any previous visit (from the past 240 hour(s)).  Radiology Reports DG Chest Port 1 View  Result Date: 07/16/2019 CLINICAL DATA:  Shortness of breath. EXAM: PORTABLE CHEST 1 VIEW COMPARISON:  07/09/2019 and CT chest 06/12/2017. FINDINGS: Trachea is midline. Heart is enlarged, stable. Pacemaker lead tips are stable in position. Worsening diffuse bilateral airspace opacification with left lower lobe consolidation. No definite pleural fluid.  Thyroidectomy clips. Probable bone infarct or enchondroma in the proximal right humerus. IMPRESSION: Worsening bilateral airspace opacification, consistent with worsening COVID-19 pneumonia. Electronically Signed   By: Lorin Picket M.D.   On: 07/16/2019 09:04   DG CHEST PORT 1 VIEW  Result Date: 07/09/2019 CLINICAL DATA:  Shortness of breath EXAM: PORTABLE CHEST 1 VIEW COMPARISON:  07/06/2019 FINDINGS: Patchy bilateral airspace disease again noted, worsening since prior study, particularly in the left lower lobe. Cardiomegaly. Pacer remains in place, unchanged. No effusions or pneumothorax. No acute bony abnormality. IMPRESSION: Patchy bilateral airspace opacities, worsening since prior study, particularly in the left lower lobe. Electronically Signed   By: Rolm Baptise M.D.   On: 07/09/2019 21:47   DG Chest Port 1 View  Result Date: 07/03/2019 CLINICAL DATA:  COVID-19 positive.  Worsening shortness of breath. EXAM: PORTABLE CHEST 1 VIEW COMPARISON:  07/18/2019. FINDINGS: Surgical  clips noted over the neck. AICD in stable position. Stable cardiomegaly. Diffuse severe bilateral pulmonary infiltrates again noted. Slight improvement in aeration on today's exam. No pleural effusion pneumothorax. Old infarct proximal right humerus. No acute bony abnormality. IMPRESSION: 1.  AICD in stable position.  Stable cardiomegaly. 2. Diffuse severe bilateral pulmonary infiltrates again noted. Slight improvement in aeration on today's exam. Electronically Signed   By: Marcello Moores  Register   On: 07/03/2019 07:15   DG Chest Port 1 View  Result Date: 06/28/2019 CLINICAL DATA:  Hypoxia. EXAM: PORTABLE CHEST 1 VIEW COMPARISON:  None. FINDINGS: There is a dual lead AICD. Moderate severity diffuse bilateral infiltrates are seen. There is a small right pleural effusion. No pneumothorax is identified. The cardiac silhouette is markedly enlarged. Multiple radiopaque surgical clips are seen overlying the superior mediastinum. There is marked severity levoscoliosis of the thoracic spine with moderate severity multilevel degenerative changes. IMPRESSION: 1. Moderate severity diffuse bilateral infiltrates with a small right pleural effusion. 2. Marked enlargement of the cardiac silhouette. 3. Dual lead AICD in place. Electronically Signed   By: Virgina Norfolk M.D.   On: 07/19/2019 15:50   DG Chest Port 1V today  Result Date: 07/06/2019 CLINICAL DATA:  Dyspnea, COVID-19 positive EXAM: PORTABLE CHEST 1 VIEW COMPARISON:  07/03/2019 chest radiograph. FINDINGS: Stable configuration of 2 lead left subclavian pacemaker. Surgical clips overlie the central lower neck. Stable cardiomediastinal silhouette with mild cardiomegaly. No pneumothorax. No pleural effusion. Patchy hazy opacities throughout the mid to lower lungs bilaterally, similar to minimally improved. IMPRESSION: Patchy hazy opacities throughout the mid to lower lungs bilaterally, similar to minimally improved, compatible with COVID-19 pneumonia. Stable  cardiomegaly. Electronically Signed   By: Ilona Sorrel M.D.   On: 07/06/2019 13:09

## 2019-07-18 NOTE — Plan of Care (Signed)

## 2019-07-18 NOTE — Progress Notes (Signed)
PT Cancellation Note  Patient Details Name: LLESENIA TROTH MRN: [id number] DOB: 09-02-1955   Cancelled Treatment:     Attempted to see pt this time but had been sleeping. Nurse reports that yesterday pt slept from this time till this morning. Also she had an episode of desaturation with exceedingly great difficulty recovery. Pt is comfortably sleeping on HFNC and sats in 90s, will f/u at a later time as schedule allows.  Horald Chestnut, PT    Delford Field 07/18/2019, 5:06 PM

## 2019-07-18 NOTE — Progress Notes (Signed)
[place] PHYSICAL MEDICINE & REHABILITATION PROGRESS NOTE Reason for Consult: Debility 2/2 COVID-19                Referring Physician: Dr. Oren Binet Referring Therapist: Maurie Boettcher, OT  Subjective/Complaints: [name] is a 64 y.o. female with PMH of amiodarone toxicity, interstitial lung disease, chronic systolic/diastolic heart failure, GERD, history of congential heart disease, DM 2, iron deficient anemia, HTN, hyperparathyroidism, PVD, renal insufficiency, persistent afib, who was diagnosed on 12/31 with COVID-19. At baseline she does not require O2. Her symptoms started on 1/3: progressive dyspnea, fever, chills, cough, body ache, and she was found to be febrile and hypoxic in the ED, initially requiring 6L [place]. CXR was significant for diffuse bilateral opacities consistent with COVID-19 pneumonia.   Patient lives alone and was previously independent. Oxygen status has worsened since I saw her 1 week ago-now requiring 15L and unable to ambulate during last PT session three days ago (was ambulating 15 feet 1 week ago).   Objective:   No results found. Recent Labs    07/17/19 0338 07/18/19 0128  WBC 16.2* 15.5*  HGB 14.9 14.9  HCT 45.9 46.6*  PLT 148* PLATELET CLUMPS NOTED ON SMEAR, UNABLE TO ESTIMATE   Recent Labs    07/17/19 0338 07/18/19 0128  NA 137 141  K 4.4 3.6  CL 93* 94*  CO2 32 34*  GLUCOSE 238* 93  BUN 61* 66*  CREATININE 1.17* 1.13*  CALCIUM 9.1 9.1    Intake/Output Summary (Last 24 hours) at 07/18/2019 1037 Last data filed at 07/18/2019 0500 Gross per 24 hour  Intake 480 ml  Output 400 ml  Net 80 ml     Physical Exam: Vital Signs Blood pressure 128/87, pulse 77, temperature (!) 97.3 F (36.3 C), temperature source Axillary, resp. rate 20, height 4\' 10"  (1.473 m), weight 59.9 kg, SpO2 91 %. Physical Exam  Gen: no distress, normal appearing HEENT: oral mucosa pink and moist, NCAT. +Thrush Cardio: Reg rate Chest: normal effort, normal  rate of breathing Abd: soft, non-distended Ext: no edema Skin: intact Neuro: AOx1 (unable to state location or date, though states month is January). Musculoskeletal: 4-/5 strength throughout.  Psych: pleasant, normal affect  Assessment/Plan: 1. Functional deficits secondary to COVID-19 pneumonia  Disposition: agree that [name] would be more appropriate for SNF at this time. She appears weaker than 1 week ago and no longer able to tolerate ambulation. -OOB as much as tolerated. Daily PT/OT if possible to work on transfers, balance, toileting, cognition, standing if tolerated.  -Consider Megace to stimulate appetite.  -Has white patches in mouth concerning for thrush, may benefit from Nystatin swish and swallow.  -Constipation: Last BM 1/20: complains of some abdominal pain this morning. Constipation could be contributing to lack of appetite. Consider increasing senna-docusate to BID.   Thank you for this consult. I will continue to follow in [name]'s care.   LOS: 16 days A FACE TO FACE EVALUATION WAS PERFORMED  Clide Deutscher Kolette Vey 07/18/2019, 10:37 AM

## 2019-07-19 LAB — GLUCOSE, CAPILLARY
Glucose-Capillary: 130 mg/dL — ABNORMAL HIGH (ref 70–99)
Glucose-Capillary: 191 mg/dL — ABNORMAL HIGH (ref 70–99)
Glucose-Capillary: 372 mg/dL — ABNORMAL HIGH (ref 70–99)
Glucose-Capillary: 194 mg/dL — ABNORMAL HIGH (ref 70–99)

## 2019-07-19 LAB — MAGNESIUM: Magnesium: 2.2 mg/dL (ref 1.7–2.4)

## 2019-07-19 LAB — COMPREHENSIVE METABOLIC PANEL
ALT: 60 U/L — ABNORMAL HIGH (ref 0–44)
AST: 38 U/L (ref 15–41)
Albumin: 2.6 g/dL — ABNORMAL LOW (ref 3.5–5.0)
Alkaline Phosphatase: 214 U/L — ABNORMAL HIGH (ref 38–126)
Anion gap: 12 (ref 5–15)
BUN: 71 mg/dL — ABNORMAL HIGH (ref 8–23)
CO2: 33 mmol/L — ABNORMAL HIGH (ref 22–32)
Calcium: 9.2 mg/dL (ref 8.9–10.3)
Chloride: 94 mmol/L — ABNORMAL LOW (ref 98–111)
Creatinine, Ser: 1.22 mg/dL — ABNORMAL HIGH (ref 0.44–1.00)
GFR calc Af Amer: 55 mL/min — ABNORMAL LOW (ref >60)
GFR calc non Af Amer: 47 mL/min — ABNORMAL LOW (ref >60)
Glucose, Bld: 97 mg/dL (ref 70–99)
Potassium: 4.1 mmol/L (ref 3.5–5.1)
Sodium: 139 mmol/L (ref 135–145)
Total Bilirubin: 0.9 mg/dL (ref 0.3–1.2)
Total Protein: 6.6 g/dL (ref 6.5–8.1)

## 2019-07-19 LAB — CBC WITH DIFFERENTIAL/PLATELET
Abs Immature Granulocytes: 0.2 10*3/uL — ABNORMAL HIGH (ref 0.00–0.07)
Basophils Absolute: 0.1 10*3/uL (ref 0.0–0.1)
Basophils Relative: 0 %
Eosinophils Absolute: 0.2 10*3/uL (ref 0.0–0.5)
Eosinophils Relative: 1 %
HCT: 47.2 % — ABNORMAL HIGH (ref 36.0–46.0)
Hemoglobin: 14.9 g/dL (ref 12.0–15.0)
Immature Granulocytes: 1 %
Lymphocytes Relative: 7 %
Lymphs Abs: 1.2 10*3/uL (ref 0.7–4.0)
MCH: 31.2 pg (ref 26.0–34.0)
MCHC: 31.6 g/dL (ref 30.0–36.0)
MCV: 99 fL (ref 80.0–100.0)
Monocytes Absolute: 1 10*3/uL (ref 0.1–1.0)
Monocytes Relative: 6 %
Neutro Abs: 13.7 10*3/uL — ABNORMAL HIGH (ref 1.7–7.7)
Neutrophils Relative %: 85 %
Platelets: 159 10*3/uL (ref 150–400)
RBC: 4.77 MIL/uL (ref 3.87–5.11)
RDW: 14.4 % (ref 11.5–15.5)
WBC: 16.3 10*3/uL — ABNORMAL HIGH (ref 4.0–10.5)
nRBC: 0 % (ref 0.0–0.2)

## 2019-07-19 LAB — BRAIN NATRIURETIC PEPTIDE: B Natriuretic Peptide: 275.4 pg/mL — ABNORMAL HIGH (ref 0.0–100.0)

## 2019-07-19 LAB — C-REACTIVE PROTEIN: CRP: 0.8 mg/dL (ref <1.0)

## 2019-07-19 LAB — D-DIMER, QUANTITATIVE: D-Dimer, Quant: 0.91 ug/mL-FEU — ABNORMAL HIGH (ref 0.00–0.50)

## 2019-07-19 MED ORDER — FUROSEMIDE 10 MG/ML IJ SOLN
40.0000 mg | Freq: Once | INTRAMUSCULAR | Status: AC
  Administered 2019-07-19: 40 mg via INTRAVENOUS
  Filled 2019-07-19: qty 4

## 2019-07-19 NOTE — Progress Notes (Signed)
PROGRESS NOTE                                                                                                                                                                                                             Patient Demographics:    [name], is a 64 y.o. female, DOB - 02/01/1956, DH:[id number]  Outpatient Primary MD for the patient is Celene Squibb, MD   Admit date - 07/03/2019   LOS - 63  Chief Complaint  Patient presents with  . Shortness of Breath  . covid pos       Brief Narrative:  Patient is a 64 y.o. 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    Post admission-she had significant worsening of her hypoxemia-requiring 100% NRB-she was then transferred to [facility].  Hypoxia gradually improved with steroids/remdesivir and empiric antibiotics-however on 1/12-1/13-hypoxia briefly worsened-chest x-ray showed a new left-sided infiltrate.  Hospital course was also complicated by development of AKI and hypernatremia.  Patient was provided supportive care with IV fluids, and was restarted on IV antibiotics (Unasyn)-with further improvement of hypoxemia.  See below for further details.    COVID-19 Medications:  Steroids: 1/6>> 1/15 Remdesivir: 1/6>>1/10 Convalescent Plasma: 1/7   Antibiotics: Unasyn: 1/14>> Cefepime: 1/6>>1/12 Vancomycin: 1/6>>1/8   Subjective:   Patient in bed, appears comfortable, denies any headache, no fever, no chest pain or pressure, she does have shortness of breath , no  abdominal pain. No focal weakness.   Assessment  & Plan :   Acute Hypoxic Resp Failure due to Covid 19 Viral pneumonia and concurrent bacterial pneumonia with underlying amiodarone induced lung injury: she had severe COVID-19 pneumonia along with bacterial pneumonia, she has been treated so far with IV steroids, remdesivir and convalescent plasma for her COVID-19 infection along with vancomycin, cefepime and subsequently Unasyn/Augmentin.  She has finished her bacterial pneumonia treatment currently on steroids and Lasix as needed, overall still quite hypoxic, despite prolonged treatment she is not progressing as expected, gnosis is guarded, she is  DNR, white have to look into comfort measures soon if she does not turn around.  Brother has been informed in detail by me.  Repeat IV Lasix 07/19/2019.  Encouraged to sit up in chair in the daytime use I-S and flutter valve for pulmonary toiletry and prone in bed at night if possible.  Also involve speech therapy to rule out ongoing aspiration.    O2 requirements:  SpO2: 90 % O2 Flow Rate (L/min): 15 L/min   COVID-19 Labs: Recent Labs    07/17/19 0338 07/18/19 0128 07/19/19 0115  DDIMER 0.80* 0.64* 0.91*  CRP 2.0* 1.1* 0.8       Component Value Date/Time   BNP 275.4 (H) 07/19/2019 0115    Recent Labs  Lab 07/14/19 0138 07/15/19 0600 07/16/19 0506 07/17/19 0338  PROCALCITON 0.14 0.15 0.14 0.12    Lab Results  Component Value Date   SARSCOV2NAA Detected (A) 06/26/2019   [place] NEGATIVE 02/20/2019   [place] Not Detected 12/18/2018    Prone/Incentive Spirometry: encouraged  incentive spirometry use 3-4/hour.   AKI on CKD stage IIIa: Likely due to fluid overload improving with Lasix.  Acute metabolic encephalopathy: Does not appear to be confused-she is hard of hearing-and is following commands.  Suspect mild metabolic encephalopathy was from hypoxemia, hyponatremia and AKI.    HTN: BP controlled-continue  metoprolol  Persistent atrial fibrillation s/p AV node ablation and PPM insertion: Continue metoprolol-on Xarelto.  Rate under good control.  Paced rhythm on telemetry.  Note has history of amiodarone induced lung toxicity  Chronic combined systolic and diastolic heart failure percent on echocardiogram in 02/2019: Dense of mild fluid overload, gentle Lasix again on 07/15/2019.  Bronchial asthma: Appears stable-no rhonchi-continue bronchodilators.  DM-2 (A1c 6.8): Has had hypoglycemic episodes a few days back-CBGs now trending back up-patient's appetite is seems to be slowly improving.  Start Lantus 10 units nightly-continue SSI and follow.  CBG (last 3)  Recent Labs    07/18/19 1742 07/18/19 2020 07/19/19 0754  GLUCAP 189* 289* 130*   Hypothyroidism: Continue levothyroxine  History of amiodarone induced lung toxicity/ILD: Supportive care.  Remote history of ASD repair  Deconditioning/debility: Appears very weak from acute illness-plans are for SNF when she is closer to discharge.    Consults  :  PCCM  Procedures  :  None  Condition - stable  Family Communication  : Brother updated on 07/16/2019. Prognosis, if" worsening despite aggressive medical treatment then comfort measures will be initiated.  DNR now. Discussed again 07/19/19.  Code Status :  DNR  Diet :  Diet Order            DIET DYS 3 Room service appropriate? Yes; Fluid consistency: Thin  Diet effective now               Disposition Plan  :  SNF.  Barriers to discharge: Hypoxia requiring O2 supplementation  Antimicorbials  :    Anti-infectives (From admission, onward)   Start     Dose/Rate Route Frequency Ordered Stop   07/15/19 1000  amoxicillin-clavulanate (AUGMENTIN) 500-125 MG per tablet 500 mg    Note to Pharmacy: Pharmacy can adjust for aspiration pneumonia, stop date 07/17/2019   1 tablet Oral 2 times daily 07/15/19 0754 07/17/19 2100   07/10/19 1000  ampicillin-sulbactam (UNASYN) 1.5 g in sodium  chloride 0.9 % 100 mL IVPB  Status:  Discontinued     1.5 g 200 mL/hr over 30 Minutes Intravenous Every 8 hours 07/10/19 0945 07/15/19 0754  07/04/19 1830  vancomycin (VANCOREADY) IVPB 750 mg/150 mL  Status:  Discontinued     750 mg 150 mL/hr over 60 Minutes Intravenous Every 48 hours 07/03/19 1249 07/04/19 1110   07/04/19 1800  vancomycin (VANCOCIN) IVPB 1000 mg/200 mL premix  Status:  Discontinued     1,000 mg 200 mL/hr over 60 Minutes Intravenous Every 48 hours 07/22/2019 1727 07/03/19 1249   07/03/19 1800  ceFEPIme (MAXIPIME) 1 g in sodium chloride 0.9 % 100 mL IVPB  Status:  Discontinued     1 g 200 mL/hr over 30 Minutes Intravenous Every 24 hours 06/29/2019 1725 07/03/19 1248   07/03/19 1800  ceFEPIme (MAXIPIME) 2 g in sodium chloride 0.9 % 100 mL IVPB     2 g 200 mL/hr over 30 Minutes Intravenous Every 24 hours 07/03/19 1247 07/08/19 1850   07/03/19 1000  remdesivir 100 mg in sodium chloride 0.9 % 100 mL IVPB     100 mg 200 mL/hr over 30 Minutes Intravenous Daily 06/27/2019 1723 07/06/19 0926   07/03/19 1000  remdesivir 100 mg in sodium chloride 0.9 % 100 mL IVPB  Status:  Discontinued     100 mg 200 mL/hr over 30 Minutes Intravenous Daily 07/01/2019 2355 07/03/19 0257   07/15/2019 2354  remdesivir 200 mg in sodium chloride 0.9% 250 mL IVPB  Status:  Discontinued     200 mg 580 mL/hr over 30 Minutes Intravenous Once 07/24/2019 2355 07/03/19 0257   07/12/2019 1800  remdesivir 200 mg in sodium chloride 0.9% 250 mL IVPB     200 mg 580 mL/hr over 30 Minutes Intravenous Once 06/28/2019 1723 07/08/2019 2306   07/03/2019 1730  vancomycin (VANCOCIN) IVPB 1000 mg/200 mL premix  Status:  Discontinued     1,000 mg 200 mL/hr over 60 Minutes Intravenous  Once 07/26/2019 1722 07/05/2019 1725   07/10/2019 1730  ceFEPIme (MAXIPIME) 2 g in sodium chloride 0.9 % 100 mL IVPB     2 g 200 mL/hr over 30 Minutes Intravenous  Once 06/28/2019 1722 07/12/2019 1918   07/04/2019 1730  vancomycin (VANCOREADY) IVPB 1500 mg/300 mL      1,500 mg 150 mL/hr over 120 Minutes Intravenous  Once 06/27/2019 1725 07/21/2019 2036      DVT Prophylaxis  : Xarelto  Inpatient Medications  Scheduled Meds: . sodium chloride   Intravenous Once  . vitamin C  500 mg Oral Daily  . fluticasone furoate-vilanterol  2 puff Inhalation Daily  . insulin aspart  0-15 Units Subcutaneous TID WC  . insulin aspart  0-5 Units Subcutaneous QHS  . insulin glargine  10 Units Subcutaneous Daily  . levothyroxine  100 mcg Oral QAC breakfast  . methylPREDNISolone (SOLU-MEDROL) injection  40 mg Intravenous Daily  . metoprolol succinate  50 mg Oral Daily  . pantoprazole  40 mg Oral BID  . pravastatin  10 mg Oral Daily  . Rivaroxaban  15 mg Oral Q breakfast  . zinc sulfate  220 mg Oral Daily   Continuous Infusions:  PRN Meds:.acetaminophen, albuterol, chlorpheniramine-HYDROcodone, guaiFENesin-dextromethorphan, lip balm, Melatonin, senna-docusate   Time Spent in minutes 25  See all Orders from today for further details   Lala Lund M.D on 07/19/2019 at 9:43 AM  To page go to [email or web address] - use universal password  [facility] -  Office  [phone]    Objective:   Vitals:   07/18/19 1200 07/18/19 1600 07/18/19 1924 07/19/19 0034  BP: 113/78 107/78 104/65   Pulse: 75 70    Resp:  20 18  Temp: 98 F (36.7 C) 97.9 F (36.6 C)  (!) 97.3 F (36.3 C)  TempSrc: Oral Oral  Oral  SpO2: 94% 93% 93% 90%  Weight:      Height:        Wt Readings from Last 3 Encounters:  07/06/2019 59.9 kg  04/01/19 65.8 kg  04/01/19 64.4 kg     Intake/Output Summary (Last 24 hours) at 07/19/2019 0943 Last data filed at 07/19/2019 0600 Gross per 24 hour  Intake 180 ml  Output 600 ml  Net -420 ml     Physical Exam  Awake  Alert, No new F.N deficits,   [place].AT,PERRAL Supple Neck,No JVD, No cervical lymphadenopathy appriciated.  Symmetrical Chest wall movement, Good air movement bilaterally, few rales RRR,No Gallops, Rubs or new Murmurs, No  Parasternal Heave +ve B.Sounds, Abd Soft, No tenderness, No organomegaly appriciated, No rebound - guarding or rigidity. No Cyanosis, Clubbing or edema, No new Rash or bruise    Data Review:    CBC Recent Labs  Lab 07/15/19 0600 07/16/19 0506 07/17/19 0338 07/18/19 0128 07/19/19 0115  WBC 14.8* 15.4* 16.2* 15.5* 16.3*  HGB 14.7 14.5 14.9 14.9 14.9  HCT 46.2* 45.4 45.9 46.6* 47.2*  PLT 163 147* 148* PLATELET CLUMPS NOTED ON SMEAR, UNABLE TO ESTIMATE 159  MCV 97.3 100.0 98.1 97.5 99.0  MCH 30.9 31.9 31.8 31.2 31.2  MCHC 31.8 31.9 32.5 32.0 31.6  RDW 14.2 14.4 14.2 14.2 14.4  LYMPHSABS 1.1 0.9 0.7 0.8 1.2  MONOABS 0.8 0.6 0.4 0.9 1.0  EOSABS 0.1 0.2 0.0 0.0 0.2  BASOSABS 0.1 0.1 0.0 0.0 0.1    Chemistries  Recent Labs  Lab 07/15/19 0600 07/16/19 0506 07/17/19 0338 07/18/19 0128 07/19/19 0115  NA 141 140 137 141 139  K 4.0 4.0 4.4 3.6 4.1  CL 100 96* 93* 94* 94*  CO2 31 32 32 34* 33*  GLUCOSE 169* 295* 238* 93 97  BUN 49* 58* 61* 66* 71*  CREATININE 1.26* 1.24* 1.17* 1.13* 1.22*  CALCIUM 8.8* 8.9 9.1 9.1 9.2  MG 2.1 2.0 2.0 2.1 2.2  AST 38 50* 32 29 38  ALT 56* 74* 67* 59* 60*  ALKPHOS 214* 254* 234* 223* 214*  BILITOT 1.0 0.8 1.1 1.3* 0.9   ------------------------------------------------------------------------------------------------------------------ No results for input(s): CHOL, HDL, LDLCALC, TRIG, CHOLHDL, LDLDIRECT in the last 72 hours.  Lab Results  Component Value Date   HGBA1C 6.8 (H) 07/06/2019   ------------------------------------------------------------------------------------------------------------------ No results for input(s): TSH, T4TOTAL, T3FREE, THYROIDAB in the last 72 hours.  Invalid input(s): FREET3 ------------------------------------------------------------------------------------------------------------------ No results for input(s): VITAMINB12, FOLATE, FERRITIN, TIBC, IRON, RETICCTPCT in the last 72 hours.  Coagulation  profile No results for input(s): INR, PROTIME in the last 168 hours.  Recent Labs    07/18/19 0128 07/19/19 0115  DDIMER 0.64* 0.91*    Cardiac Enzymes No results for input(s): CKMB, TROPONINI, MYOGLOBIN in the last 168 hours.  Invalid input(s): CK ------------------------------------------------------------------------------------------------------------------    Component Value Date/Time   BNP 275.4 (H) 07/19/2019 0115    Micro Results No results found for this or any previous visit (from the past 240 hour(s)).  Radiology Reports DG Chest Port 1 View  Result Date: 07/16/2019 CLINICAL DATA:  Shortness of breath. EXAM: PORTABLE CHEST 1 VIEW COMPARISON:  07/09/2019 and CT chest 06/12/2017. FINDINGS: Trachea is midline. Heart is enlarged, stable. Pacemaker lead tips are stable in position. Worsening diffuse bilateral airspace opacification with left lower lobe consolidation. No definite pleural fluid.  Thyroidectomy clips. Probable bone infarct or enchondroma in the proximal right humerus. IMPRESSION: Worsening bilateral airspace opacification, consistent with worsening COVID-19 pneumonia. Electronically Signed   By: Lorin Picket M.D.   On: 07/16/2019 09:04   DG CHEST PORT 1 VIEW  Result Date: 07/09/2019 CLINICAL DATA:  Shortness of breath EXAM: PORTABLE CHEST 1 VIEW COMPARISON:  07/06/2019 FINDINGS: Patchy bilateral airspace disease again noted, worsening since prior study, particularly in the left lower lobe. Cardiomegaly. Pacer remains in place, unchanged. No effusions or pneumothorax. No acute bony abnormality. IMPRESSION: Patchy bilateral airspace opacities, worsening since prior study, particularly in the left lower lobe. Electronically Signed   By: Rolm Baptise M.D.   On: 07/09/2019 21:47   DG Chest Port 1 View  Result Date: 07/03/2019 CLINICAL DATA:  COVID-19 positive.  Worsening shortness of breath. EXAM: PORTABLE CHEST 1 VIEW COMPARISON:  07/17/2019. FINDINGS: Surgical  clips noted over the neck. AICD in stable position. Stable cardiomegaly. Diffuse severe bilateral pulmonary infiltrates again noted. Slight improvement in aeration on today's exam. No pleural effusion pneumothorax. Old infarct proximal right humerus. No acute bony abnormality. IMPRESSION: 1.  AICD in stable position.  Stable cardiomegaly. 2. Diffuse severe bilateral pulmonary infiltrates again noted. Slight improvement in aeration on today's exam. Electronically Signed   By: Marcello Moores  Register   On: 07/03/2019 07:15   DG Chest Port 1 View  Result Date: 07/15/2019 CLINICAL DATA:  Hypoxia. EXAM: PORTABLE CHEST 1 VIEW COMPARISON:  None. FINDINGS: There is a dual lead AICD. Moderate severity diffuse bilateral infiltrates are seen. There is a small right pleural effusion. No pneumothorax is identified. The cardiac silhouette is markedly enlarged. Multiple radiopaque surgical clips are seen overlying the superior mediastinum. There is marked severity levoscoliosis of the thoracic spine with moderate severity multilevel degenerative changes. IMPRESSION: 1. Moderate severity diffuse bilateral infiltrates with a small right pleural effusion. 2. Marked enlargement of the cardiac silhouette. 3. Dual lead AICD in place. Electronically Signed   By: Virgina Norfolk M.D.   On: 07/27/2019 15:50   DG Chest Port 1V today  Result Date: 07/06/2019 CLINICAL DATA:  Dyspnea, COVID-19 positive EXAM: PORTABLE CHEST 1 VIEW COMPARISON:  07/03/2019 chest radiograph. FINDINGS: Stable configuration of 2 lead left subclavian pacemaker. Surgical clips overlie the central lower neck. Stable cardiomediastinal silhouette with mild cardiomegaly. No pneumothorax. No pleural effusion. Patchy hazy opacities throughout the mid to lower lungs bilaterally, similar to minimally improved. IMPRESSION: Patchy hazy opacities throughout the mid to lower lungs bilaterally, similar to minimally improved, compatible with COVID-19 pneumonia. Stable  cardiomegaly. Electronically Signed   By: Ilona Sorrel M.D.   On: 07/06/2019 13:09

## 2019-07-19 NOTE — Plan of Care (Signed)

## 2019-07-20 LAB — CBC WITH DIFFERENTIAL/PLATELET
Abs Immature Granulocytes: 0.18 10*3/uL — ABNORMAL HIGH (ref 0.00–0.07)
Basophils Absolute: 0 10*3/uL (ref 0.0–0.1)
Basophils Relative: 0 %
Eosinophils Absolute: 0.2 10*3/uL (ref 0.0–0.5)
Eosinophils Relative: 1 %
HCT: 48.9 % — ABNORMAL HIGH (ref 36.0–46.0)
Hemoglobin: 15.6 g/dL — ABNORMAL HIGH (ref 12.0–15.0)
Immature Granulocytes: 1 %
Lymphocytes Relative: 7 %
Lymphs Abs: 1 10*3/uL (ref 0.7–4.0)
MCH: 31.6 pg (ref 26.0–34.0)
MCHC: 31.9 g/dL (ref 30.0–36.0)
MCV: 99.2 fL (ref 80.0–100.0)
Monocytes Absolute: 1.1 10*3/uL — ABNORMAL HIGH (ref 0.1–1.0)
Monocytes Relative: 7 %
Neutro Abs: 13.3 10*3/uL — ABNORMAL HIGH (ref 1.7–7.7)
Neutrophils Relative %: 84 %
Platelets: 145 10*3/uL — ABNORMAL LOW (ref 150–400)
RBC: 4.93 MIL/uL (ref 3.87–5.11)
RDW: 14.1 % (ref 11.5–15.5)
WBC: 15.8 10*3/uL — ABNORMAL HIGH (ref 4.0–10.5)
nRBC: 0 % (ref 0.0–0.2)

## 2019-07-20 LAB — GLUCOSE, CAPILLARY
Glucose-Capillary: 82 mg/dL (ref 70–99)
Glucose-Capillary: 229 mg/dL — ABNORMAL HIGH (ref 70–99)
Glucose-Capillary: 296 mg/dL — ABNORMAL HIGH (ref 70–99)
Glucose-Capillary: 273 mg/dL — ABNORMAL HIGH (ref 70–99)

## 2019-07-20 LAB — COMPREHENSIVE METABOLIC PANEL
ALT: 61 U/L — ABNORMAL HIGH (ref 0–44)
AST: 36 U/L (ref 15–41)
Albumin: 2.7 g/dL — ABNORMAL LOW (ref 3.5–5.0)
Alkaline Phosphatase: 216 U/L — ABNORMAL HIGH (ref 38–126)
Anion gap: 14 (ref 5–15)
BUN: 58 mg/dL — ABNORMAL HIGH (ref 8–23)
CO2: 32 mmol/L (ref 22–32)
Calcium: 9.2 mg/dL (ref 8.9–10.3)
Chloride: 93 mmol/L — ABNORMAL LOW (ref 98–111)
Creatinine, Ser: 1.21 mg/dL — ABNORMAL HIGH (ref 0.44–1.00)
GFR calc Af Amer: 55 mL/min — ABNORMAL LOW (ref >60)
GFR calc non Af Amer: 48 mL/min — ABNORMAL LOW (ref >60)
Glucose, Bld: 22 mg/dL — CL (ref 70–99)
Potassium: 4.6 mmol/L (ref 3.5–5.1)
Sodium: 139 mmol/L (ref 135–145)
Total Bilirubin: 1 mg/dL (ref 0.3–1.2)
Total Protein: 6.8 g/dL (ref 6.5–8.1)

## 2019-07-20 LAB — C-REACTIVE PROTEIN: CRP: 0.9 mg/dL (ref <1.0)

## 2019-07-20 LAB — PROCALCITONIN: Procalcitonin: <0.1 ng/mL

## 2019-07-20 LAB — D-DIMER, QUANTITATIVE: D-Dimer, Quant: 0.72 ug/mL-FEU — ABNORMAL HIGH (ref 0.00–0.50)

## 2019-07-20 LAB — MAGNESIUM: Magnesium: 2.1 mg/dL (ref 1.7–2.4)

## 2019-07-20 MED ORDER — FUROSEMIDE 10 MG/ML IJ SOLN
40.0000 mg | Freq: Every day | INTRAMUSCULAR | Status: DC
  Administered 2019-07-20 – 2019-07-21 (×2): 40 mg via INTRAVENOUS
  Filled 2019-07-20 (×2): qty 4

## 2019-07-20 MED ORDER — SPIRONOLACTONE 25 MG PO TABS
25.0000 mg | ORAL_TABLET | Freq: Every day | ORAL | Status: DC
  Administered 2019-07-20 – 2019-07-21 (×2): 25 mg via ORAL
  Filled 2019-07-20 (×3): qty 1

## 2019-07-20 NOTE — Progress Notes (Signed)
Notified by lab that patient glucose level was 22 @ 0303 this am, blood glucose at 0730 was 82.

## 2019-07-20 NOTE — Progress Notes (Signed)
PROGRESS NOTE                                                                                                                                                                                                             Patient Demographics:    [name], is a 64 y.o. female, DOB - [birth year], [id number]  Outpatient Primary MD for the patient is Celene Squibb, MD   Admit date - 07/27/2019   LOS - 33  Chief Complaint  Patient presents with  . Shortness of Breath  . covid pos       Brief Narrative:  Patient is a 64 y.o. 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    Post admission-she had significant worsening of her hypoxemia-requiring 100% NRB-she was then transferred to [facility].  Hypoxia gradually improved with steroids/remdesivir and empiric antibiotics-however on 1/12-1/13-hypoxia briefly worsened-chest x-ray showed a new left-sided infiltrate.  Hospital course was also complicated by development of AKI and hypernatremia.  Patient was provided supportive care with IV fluids, and was restarted on IV antibiotics (Unasyn)-with further improvement of hypoxemia.  See below for further details.    COVID-19 Medications:  Steroids: 1/6>> 1/15 Remdesivir: 1/6>>1/10 Convalescent Plasma: 1/7   Antibiotics: Unasyn: 1/14>> Cefepime: 1/6>>1/12 Vancomycin: 1/6>>1/8   Subjective:   Patient in bed, appears comfortable, denies any headache, no fever, no chest pain or pressure, improved shortness of breath , no abdominal  pain. No focal weakness.    Assessment  & Plan :   Acute Hypoxic Resp Failure due to Covid 19 Viral pneumonia and concurrent bacterial pneumonia with underlying amiodarone induced lung injury: she had severe COVID-19 pneumonia along with bacterial pneumonia, she has been treated so far with IV steroids, remdesivir and convalescent plasma for her COVID-19 infection along with vancomycin, cefepime and subsequently Unasyn/Augmentin.  She has finished her bacterial pneumonia treatment currently on steroids and Lasix as needed, overall still quite hypoxic, despite prolonged treatment she is not progressing as expected, gnosis is guarded, she is DNR,  white have to look into comfort measures soon if she does not turn around.  Brother has been informed in detail by me.  She has been placed on the combination of scheduled IV Lasix and Aldactone 07/20/2019, note she does take oral Lasix daily at home as well.  Encouraged to sit up in chair in the daytime use I-S and flutter valve for pulmonary toiletry and prone in bed at night if possible.  Also involve speech therapy to rule out ongoing aspiration.    O2 requirements:  SpO2: 92 % O2 Flow Rate (L/min): 15 L/min   COVID-19 Labs: Recent Labs    07/18/19 0128 07/19/19 0115 07/20/19 0303  DDIMER 0.64* 0.91* 0.72*  CRP 1.1* 0.8  --        Component Value Date/Time   BNP 275.4 (H) 07/19/2019 0115    Recent Labs  Lab 07/14/19 0138 07/15/19 0600 07/16/19 0506 07/17/19 0338  PROCALCITON 0.14 0.15 0.14 0.12    Lab Results  Component Value Date   SARSCOV2NAA Detected (A) 06/26/2019   [place] NEGATIVE 02/20/2019   [place] Not Detected 12/18/2018    Prone/Incentive Spirometry: encouraged  incentive spirometry use 3-4/hour.   AKI on CKD stage IIIa: Likely due to fluid overload improving with Lasix.  Acute metabolic encephalopathy: Does not appear to be confused-she is hard of hearing-and is following commands.  Suspect mild  metabolic encephalopathy was from hypoxemia, hyponatremia and AKI.    HTN: BP controlled-continue metoprolol  Persistent atrial fibrillation s/p AV node ablation and PPM insertion: Continue metoprolol-on Xarelto.  Rate under good control.  Paced rhythm on telemetry.  Note has history of amiodarone induced lung toxicity  Chronic combined systolic and diastolic heart failure percent on echocardiogram in 02/2019: Has been adequately diuresed and now on scheduled Lasix and Aldactone combination.  Bronchial asthma: Appears stable-no rhonchi-continue bronchodilators.  DM-2 (A1c 6.8): Has had hypoglycemic episodes a few days back-CBGs now trending back up-patient's appetite is seems to be slowly improving.  Start Lantus 10 units nightly-continue SSI and follow.  CBG (last 3)  Recent Labs    07/19/19 1142 07/19/19 1630 07/19/19 2104  GLUCAP 191* 372* 194*   Hypothyroidism: Continue levothyroxine  History of amiodarone induced lung toxicity/ILD: Supportive care.  Remote history of ASD repair  Deconditioning/debility: Appears very weak from acute illness-plans are for SNF when she is closer to discharge.    Consults  :  PCCM  Procedures  :  None  Condition - stable  Family Communication  : Brother updated on 07/16/2019. Prognosis, if" worsening despite aggressive medical treatment then comfort measures will be initiated.  DNR now. Discussed again 07/19/19.  Code Status :  DNR  Diet :  Diet Order            DIET DYS 3 Room service appropriate? Yes; Fluid consistency: Thin  Diet effective now               Disposition Plan  :  SNF.  Barriers to discharge: Hypoxia requiring O2 supplementation  Antimicorbials  :    Anti-infectives (From admission, onward)   Start     Dose/Rate Route Frequency Ordered Stop   07/15/19 1000  amoxicillin-clavulanate (AUGMENTIN) 500-125 MG per tablet 500 mg    Note to Pharmacy: Pharmacy can adjust for aspiration pneumonia, stop date 07/17/2019    1 tablet Oral 2 times daily 07/15/19 0754 07/17/19 2100   07/10/19 1000  ampicillin-sulbactam (UNASYN) 1.5 g in sodium chloride 0.9 % 100 mL IVPB  Status:  Discontinued     1.5 g 200 mL/hr over 30 Minutes Intravenous Every 8 hours 07/10/19 0945 07/15/19 0754   07/04/19 1830  vancomycin (VANCOREADY) IVPB 750 mg/150 mL  Status:  Discontinued     750 mg 150 mL/hr over 60 Minutes Intravenous Every 48 hours 07/03/19 1249 07/04/19 1110   07/04/19 1800  vancomycin (VANCOCIN) IVPB 1000 mg/200 mL premix  Status:  Discontinued     1,000 mg 200 mL/hr over 60 Minutes Intravenous Every 48 hours 07/11/2019 1727 07/03/19 1249   07/03/19 1800  ceFEPIme (MAXIPIME) 1 g in sodium chloride 0.9 % 100 mL IVPB  Status:  Discontinued     1 g 200 mL/hr over 30 Minutes Intravenous Every 24 hours 07/11/2019 1725 07/03/19 1248   07/03/19 1800  ceFEPIme (MAXIPIME) 2 g in sodium chloride 0.9 % 100 mL IVPB     2 g 200 mL/hr over 30 Minutes Intravenous Every 24 hours 07/03/19 1247 07/08/19 1850   07/03/19 1000  remdesivir 100 mg in sodium chloride 0.9 % 100 mL IVPB     100 mg 200 mL/hr over 30 Minutes Intravenous Daily 07/06/2019 1723 07/06/19 0926   07/03/19 1000  remdesivir 100 mg in sodium chloride 0.9 % 100 mL IVPB  Status:  Discontinued     100 mg 200 mL/hr over 30 Minutes Intravenous Daily 07/25/2019 2355 07/03/19 0257   07/10/2019 2354  remdesivir 200 mg in sodium chloride 0.9% 250 mL IVPB  Status:  Discontinued     200 mg 580 mL/hr over 30 Minutes Intravenous Once 07/05/2019 2355 07/03/19 0257   07/06/2019 1800  remdesivir 200 mg in sodium chloride 0.9% 250 mL IVPB     200 mg 580 mL/hr over 30 Minutes Intravenous Once 07/01/2019 1723 07/23/2019 2306   07/08/2019 1730  vancomycin (VANCOCIN) IVPB 1000 mg/200 mL premix  Status:  Discontinued     1,000 mg 200 mL/hr over 60 Minutes Intravenous  Once 07/11/2019 1722 07/24/2019 1725   07/19/2019 1730  ceFEPIme (MAXIPIME) 2 g in sodium chloride 0.9 % 100 mL IVPB     2 g 200 mL/hr over 30  Minutes Intravenous  Once 07/13/2019 1722 07/01/2019 1918   07/01/2019 1730  vancomycin (VANCOREADY) IVPB 1500 mg/300 mL     1,500 mg 150 mL/hr over 120 Minutes Intravenous  Once 07/20/2019 1725 07/10/2019 2036      DVT Prophylaxis  : Xarelto  Inpatient Medications  Scheduled Meds: . vitamin C  500 mg Oral Daily  . fluticasone furoate-vilanterol  2 puff Inhalation Daily  . furosemide  40 mg Intravenous Daily  . insulin aspart  0-15 Units Subcutaneous TID WC  . insulin aspart  0-5 Units Subcutaneous QHS  . insulin glargine  10 Units Subcutaneous Daily  . levothyroxine  100 mcg Oral QAC breakfast  . methylPREDNISolone (SOLU-MEDROL) injection  40 mg Intravenous Daily  . metoprolol succinate  50 mg Oral Daily  . pantoprazole  40 mg Oral BID  . pravastatin  10 mg Oral Daily  . Rivaroxaban  15 mg Oral Q breakfast  . spironolactone  25 mg Oral Daily  . zinc sulfate  220 mg Oral Daily   Continuous Infusions:  PRN Meds:.acetaminophen, albuterol, chlorpheniramine-HYDROcodone, guaiFENesin-dextromethorphan, lip balm, Melatonin, senna-docusate   Time Spent in minutes 25  See all Orders from today for further details   Lala Lund M.D on 07/20/2019 at 9:38 AM  To page go to [email or web address] - use universal password  [facility] -  Office  [phone]    Objective:   Vitals:   07/19/19 1631 07/19/19 1940 07/20/19 0541 07/20/19 0730  BP: 127/87 (!) 131/94 107/80 120/87  Pulse: 70 72 70 72  Resp:      Temp: (!) 97.2 F (36.2 C) (!) 97.4 F (36.3 C) 97.9 F (36.6 C) (!) 97.4 F (36.3 C)  TempSrc: Oral Oral Oral Oral  SpO2: 96% 98% 92% 92%  Weight:      Height:        Wt Readings from Last 3 Encounters:  07/13/2019 59.9 kg  04/01/19 65.8 kg  04/01/19 64.4 kg     Intake/Output Summary (Last 24 hours) at 07/20/2019 0938 Last data filed at 07/19/2019 1900 Gross per 24 hour  Intake --  Output 650 ml  Net -650 ml     Physical Exam  Awake Alert,  No new F.N deficits,  Normal affect [place].AT,PERRAL Supple Neck,No JVD, No cervical lymphadenopathy appriciated.  Symmetrical Chest wall movement, Good air movement bilaterally, few rales RRR,No Gallops, Rubs or new Murmurs, No Parasternal Heave +ve B.Sounds, Abd Soft, No tenderness, No organomegaly appriciated, No rebound - guarding or rigidity. No Cyanosis, Clubbing or edema, No new Rash or bruise     Data Review:    CBC Recent Labs  Lab 07/16/19 0506 07/17/19 0338 07/18/19 0128 07/19/19 0115 07/20/19 0303  WBC 15.4* 16.2* 15.5* 16.3* 15.8*  HGB 14.5 14.9 14.9 14.9 15.6*  HCT 45.4 45.9 46.6* 47.2* 48.9*  PLT 147* 148* PLATELET CLUMPS NOTED ON SMEAR, UNABLE TO ESTIMATE 159 145*  MCV 100.0 98.1 97.5 99.0 99.2  MCH 31.9 31.8 31.2 31.2 31.6  MCHC 31.9 32.5 32.0 31.6 31.9  RDW 14.4 14.2 14.2 14.4 14.1  LYMPHSABS 0.9 0.7 0.8 1.2 1.0  MONOABS 0.6 0.4 0.9 1.0 1.1*  EOSABS 0.2 0.0 0.0 0.2 0.2  BASOSABS 0.1 0.0 0.0 0.1 0.0    Chemistries  Recent Labs  Lab 07/15/19 0600 07/16/19 0506 07/17/19 0338 07/18/19 0128 07/19/19 0115  NA 141 140 137 141 139  K 4.0 4.0 4.4 3.6 4.1  CL 100 96* 93* 94* 94*  CO2 31 32 32 34* 33*  GLUCOSE 169* 295* 238* 93 97  BUN 49* 58* 61* 66* 71*  CREATININE 1.26* 1.24* 1.17* 1.13* 1.22*  CALCIUM 8.8* 8.9 9.1 9.1 9.2  MG 2.1 2.0 2.0 2.1 2.2  AST 38 50* 32 29 38  ALT 56* 74* 67* 59* 60*  ALKPHOS 214* 254* 234* 223* 214*  BILITOT 1.0 0.8 1.1 1.3* 0.9   ------------------------------------------------------------------------------------------------------------------ No results for input(s): CHOL, HDL, LDLCALC, TRIG, CHOLHDL, LDLDIRECT in the last 72 hours.  Lab Results  Component Value Date   HGBA1C 6.8 (H) 07/06/2019   ------------------------------------------------------------------------------------------------------------------ No results for input(s): TSH, T4TOTAL, T3FREE, THYROIDAB in the last 72 hours.  Invalid input(s):  FREET3 ------------------------------------------------------------------------------------------------------------------ No results for input(s): VITAMINB12, FOLATE, FERRITIN, TIBC, IRON, RETICCTPCT in the last 72 hours.  Coagulation profile No results for input(s): INR, PROTIME in the last 168 hours.  Recent Labs    07/19/19 0115 07/20/19 0303  DDIMER 0.91* 0.72*    Cardiac Enzymes No results for input(s): CKMB, TROPONINI, MYOGLOBIN in the last 168 hours.  Invalid input(s): CK ------------------------------------------------------------------------------------------------------------------    Component Value Date/Time   BNP 275.4 (H) 07/19/2019 0115    Micro Results No results found for this or any previous visit (from the past 240 hour(s)).  Radiology Reports DG Chest Port 1 View  Result Date: 07/16/2019 CLINICAL DATA:  Shortness of breath. EXAM:  PORTABLE CHEST 1 VIEW COMPARISON:  07/09/2019 and CT chest 06/12/2017. FINDINGS: Trachea is midline. Heart is enlarged, stable. Pacemaker lead tips are stable in position. Worsening diffuse bilateral airspace opacification with left lower lobe consolidation. No definite pleural fluid. Thyroidectomy clips. Probable bone infarct or enchondroma in the proximal right humerus. IMPRESSION: Worsening bilateral airspace opacification, consistent with worsening COVID-19 pneumonia. Electronically Signed   By: Lorin Picket M.D.   On: 07/16/2019 09:04   DG CHEST PORT 1 VIEW  Result Date: 07/09/2019 CLINICAL DATA:  Shortness of breath EXAM: PORTABLE CHEST 1 VIEW COMPARISON:  07/06/2019 FINDINGS: Patchy bilateral airspace disease again noted, worsening since prior study, particularly in the left lower lobe. Cardiomegaly. Pacer remains in place, unchanged. No effusions or pneumothorax. No acute bony abnormality. IMPRESSION: Patchy bilateral airspace opacities, worsening since prior study, particularly in the left lower lobe. Electronically Signed    By: Rolm Baptise M.D.   On: 07/09/2019 21:47   DG Chest Port 1 View  Result Date: 07/03/2019 CLINICAL DATA:  COVID-19 positive.  Worsening shortness of breath. EXAM: PORTABLE CHEST 1 VIEW COMPARISON:  07/13/2019. FINDINGS: Surgical clips noted over the neck. AICD in stable position. Stable cardiomegaly. Diffuse severe bilateral pulmonary infiltrates again noted. Slight improvement in aeration on today's exam. No pleural effusion pneumothorax. Old infarct proximal right humerus. No acute bony abnormality. IMPRESSION: 1.  AICD in stable position.  Stable cardiomegaly. 2. Diffuse severe bilateral pulmonary infiltrates again noted. Slight improvement in aeration on today's exam. Electronically Signed   By: Marcello Moores  Register   On: 07/03/2019 07:15   DG Chest Port 1 View  Result Date: 07/01/2019 CLINICAL DATA:  Hypoxia. EXAM: PORTABLE CHEST 1 VIEW COMPARISON:  None. FINDINGS: There is a dual lead AICD. Moderate severity diffuse bilateral infiltrates are seen. There is a small right pleural effusion. No pneumothorax is identified. The cardiac silhouette is markedly enlarged. Multiple radiopaque surgical clips are seen overlying the superior mediastinum. There is marked severity levoscoliosis of the thoracic spine with moderate severity multilevel degenerative changes. IMPRESSION: 1. Moderate severity diffuse bilateral infiltrates with a small right pleural effusion. 2. Marked enlargement of the cardiac silhouette. 3. Dual lead AICD in place. Electronically Signed   By: Virgina Norfolk M.D.   On: 07/01/2019 15:50   DG Chest Port 1V today  Result Date: 07/06/2019 CLINICAL DATA:  Dyspnea, COVID-19 positive EXAM: PORTABLE CHEST 1 VIEW COMPARISON:  07/03/2019 chest radiograph. FINDINGS: Stable configuration of 2 lead left subclavian pacemaker. Surgical clips overlie the central lower neck. Stable cardiomediastinal silhouette with mild cardiomegaly. No pneumothorax. No pleural effusion. Patchy hazy opacities  throughout the mid to lower lungs bilaterally, similar to minimally improved. IMPRESSION: Patchy hazy opacities throughout the mid to lower lungs bilaterally, similar to minimally improved, compatible with COVID-19 pneumonia. Stable cardiomegaly. Electronically Signed   By: Ilona Sorrel M.D.   On: 07/06/2019 13:09

## 2019-07-20 NOTE — Plan of Care (Signed)

## 2019-07-21 LAB — GLUCOSE, CAPILLARY
Glucose-Capillary: 74 mg/dL (ref 70–99)
Glucose-Capillary: 175 mg/dL — ABNORMAL HIGH (ref 70–99)
Glucose-Capillary: 399 mg/dL — ABNORMAL HIGH (ref 70–99)
Glucose-Capillary: 328 mg/dL — ABNORMAL HIGH (ref 70–99)

## 2019-07-21 LAB — CBC WITH DIFFERENTIAL/PLATELET
Abs Immature Granulocytes: 0.16 10*3/uL — ABNORMAL HIGH (ref 0.00–0.07)
Basophils Absolute: 0.1 10*3/uL (ref 0.0–0.1)
Basophils Relative: 0 %
Eosinophils Absolute: 0.2 10*3/uL (ref 0.0–0.5)
Eosinophils Relative: 1 %
HCT: 51 % — ABNORMAL HIGH (ref 36.0–46.0)
Hemoglobin: 16.1 g/dL — ABNORMAL HIGH (ref 12.0–15.0)
Immature Granulocytes: 1 %
Lymphocytes Relative: 6 %
Lymphs Abs: 1.1 10*3/uL (ref 0.7–4.0)
MCH: 31.1 pg (ref 26.0–34.0)
MCHC: 31.6 g/dL (ref 30.0–36.0)
MCV: 98.5 fL (ref 80.0–100.0)
Monocytes Absolute: 0.9 10*3/uL (ref 0.1–1.0)
Monocytes Relative: 5 %
Neutro Abs: 15.4 10*3/uL — ABNORMAL HIGH (ref 1.7–7.7)
Neutrophils Relative %: 87 %
Platelets: 160 10*3/uL (ref 150–400)
RBC: 5.18 MIL/uL — ABNORMAL HIGH (ref 3.87–5.11)
RDW: 14.1 % (ref 11.5–15.5)
WBC: 17.8 10*3/uL — ABNORMAL HIGH (ref 4.0–10.5)
nRBC: 0 % (ref 0.0–0.2)

## 2019-07-21 LAB — COMPREHENSIVE METABOLIC PANEL
ALT: 51 U/L — ABNORMAL HIGH (ref 0–44)
AST: 25 U/L (ref 15–41)
Albumin: 2.9 g/dL — ABNORMAL LOW (ref 3.5–5.0)
Alkaline Phosphatase: 204 U/L — ABNORMAL HIGH (ref 38–126)
Anion gap: 14 (ref 5–15)
BUN: 65 mg/dL — ABNORMAL HIGH (ref 8–23)
CO2: 31 mmol/L (ref 22–32)
Calcium: 9.3 mg/dL (ref 8.9–10.3)
Chloride: 92 mmol/L — ABNORMAL LOW (ref 98–111)
Creatinine, Ser: 1.25 mg/dL — ABNORMAL HIGH (ref 0.44–1.00)
GFR calc Af Amer: 53 mL/min — ABNORMAL LOW (ref >60)
GFR calc non Af Amer: 46 mL/min — ABNORMAL LOW (ref >60)
Glucose, Bld: 36 mg/dL — CL (ref 70–99)
Potassium: 4.7 mmol/L (ref 3.5–5.1)
Sodium: 137 mmol/L (ref 135–145)
Total Bilirubin: 1 mg/dL (ref 0.3–1.2)
Total Protein: 7 g/dL (ref 6.5–8.1)

## 2019-07-21 LAB — C-REACTIVE PROTEIN: CRP: 2 mg/dL — ABNORMAL HIGH (ref <1.0)

## 2019-07-21 LAB — PROCALCITONIN: Procalcitonin: 0.15 ng/mL

## 2019-07-21 LAB — D-DIMER, QUANTITATIVE: D-Dimer, Quant: 1.23 ug/mL-FEU — ABNORMAL HIGH (ref 0.00–0.50)

## 2019-07-21 LAB — MAGNESIUM: Magnesium: 2.1 mg/dL (ref 1.7–2.4)

## 2019-07-21 MED ORDER — INSULIN ASPART 100 UNIT/ML ~~LOC~~ SOLN
0.0000 [IU] | Freq: Every day | SUBCUTANEOUS | Status: DC
  Administered 2019-07-21: 4 [IU] via SUBCUTANEOUS

## 2019-07-21 MED ORDER — INSULIN ASPART 100 UNIT/ML ~~LOC~~ SOLN
0.0000 [IU] | Freq: Three times a day (TID) | SUBCUTANEOUS | Status: DC
  Administered 2019-07-21: 17:00:00 9 [IU] via SUBCUTANEOUS
  Administered 2019-07-21: 13:00:00 2 [IU] via SUBCUTANEOUS

## 2019-07-21 MED ORDER — ONDANSETRON HCL 4 MG/2ML IJ SOLN
4.0000 mg | Freq: Four times a day (QID) | INTRAMUSCULAR | Status: DC | PRN
  Administered 2019-07-21: 10:00:00 4 mg via INTRAVENOUS
  Filled 2019-07-21: qty 2

## 2019-07-21 NOTE — Progress Notes (Signed)
Blood Glucose at 0710 was 74, after 4 oz OJ. Patient asymptomatic.

## 2019-07-21 NOTE — Progress Notes (Signed)
Physical Therapy Treatment Patient Details Name: [name] MRN: [id number] DOB: 1956-01-26 Today's Date: 07/21/2019    History of Present Illness 64 y.o. female, PMHx including amiodarone toxicity, interstitial lung disease, chronic systolic/diastolic heart failure, GERD,  history of congenital heart disease, DM II, iron deficient anemia, hypertension,hyperparathyroidism, peripheral vascular disease, renal insufficiency, persistent A. fib anticoagulated on Xarelto presented to ED w/ shortness of breath, patient was recently diagnosed 12/31 with COVID-19, at baseline has no oxygen requirement, patient reported her symptoms started on 1/3, with progressive dyspnea, fever, chills, cough, neurolyse body ache, denies nausea, vomiting, diarrhea, chest pain, leg edema, or orthopnea. in ED was febrile 103, hypoxic in the mid 80s, requiring initially 6 L nasal cannula, chest x-ray significant for diffuse bilateral opacities, pt admited for COVID-19 for pneumonia.    PT Comments    Pt making slow progress. Nauseous today limiting progress. Continue to recommend SNF.    Follow Up Recommendations  SNF     Equipment Recommendations  None recommended by PT    Recommendations for Other Services       Precautions / Restrictions Precautions Precautions: Fall;Other (comment) Precaution Comments: Monitor oxygen; desats with exertion Restrictions Weight Bearing Restrictions: No    Mobility  Bed Mobility               General bed mobility comments: Pt up in chair  Transfers Overall transfer level: Needs assistance Equipment used: Rolling walker (2 wheeled) Transfers: Sit to/from Stand Sit to Stand: Min assist         General transfer comment: Assist for balance. Verbal cues for hand placement.   Ambulation/Gait Ambulation/Gait assistance: Min assist;+2 safety/equipment Gait Distance (Feet): 15 Feet Assistive device: Rolling walker (2 wheeled) Gait Pattern/deviations:  Step-through pattern;Decreased step length - right;Decreased step length - left;Shuffle;Trunk flexed Gait velocity: deccr Gait velocity interpretation: <1.31 ft/sec, indicative of household ambulator General Gait Details: Assist for balance and support. Amb on 10L with SpO2 to 89%.    Stairs             Wheelchair Mobility    Modified Rankin (Stroke Patients Only)       Balance Overall balance assessment: Needs assistance Sitting-balance support: Feet supported Sitting balance-Leahy Scale: Fair     Standing balance support: During functional activity;Bilateral upper extremity supported Standing balance-Leahy Scale: Poor Standing balance comment: walker and min guard for static standing                            Cognition Arousal/Alertness: Awake/alert Behavior During Therapy: Flat affect Overall Cognitive Status: No family/caregiver present to determine baseline cognitive functioning                         Following Commands: Follows one step commands with increased time     Problem Solving: Slow processing;Requires verbal cues        Exercises      General Comments        Pertinent Vitals/Pain Pain Assessment: Faces Faces Pain Scale: No hurt    Home Living                      Prior Function            PT Goals (current goals can now be found in the care plan section) Acute Rehab PT Goals Patient Stated Goal: Pt c/o nausea Progress towards PT goals: Progressing toward goals  Frequency    Min 2X/week      PT Plan Current plan remains appropriate    Co-evaluation              AM-PAC PT "6 Clicks" Mobility   Outcome Measure  Help needed turning from your back to your side while in a flat bed without using bedrails?: A Lot Help needed moving from lying on your back to sitting on the side of a flat bed without using bedrails?: A Lot Help needed moving to and from a bed to a chair (including a  wheelchair)?: A Little Help needed standing up from a chair using your arms (e.g., wheelchair or bedside chair)?: A Little Help needed to walk in hospital room?: A Lot Help needed climbing 3-5 steps with a railing? : Total 6 Click Score: 13    End of Session Equipment Utilized During Treatment: Oxygen Activity Tolerance: Patient limited by fatigue;Treatment limited secondary to medical complications (Comment)(nausea) Patient left: in chair;with call bell/phone within reach;with chair alarm set Nurse Communication: Mobility status PT Visit Diagnosis: Other abnormalities of gait and mobility (R26.89);Muscle weakness (generalized) (M62.81)     Time: 1006-1040 PT Time Calculation (min) (ACUTE ONLY): 34 min  Charges:  $Gait Training: 23-37 mins                     [place] Pager [phone] Office [place] 07/21/2019, 2:15 PM

## 2019-07-21 NOTE — Plan of Care (Signed)

## 2019-07-21 NOTE — Progress Notes (Addendum)
PROGRESS NOTE                                                                                                                                                                                                             Patient Demographics:    [name], is a 64 y.o. female, DOB - 1956-05-06, [id number]  Outpatient Primary MD for the patient is Celene Squibb, MD   Admit date - 07/15/2019   LOS - 54  Chief Complaint  Patient presents with  . Shortness of Breath  . covid pos       Brief Narrative:  Patient is a 64 y.o. 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    Post admission-she had significant worsening of her hypoxemia-requiring 100% NRB-she was then transferred to [facility].  Hypoxia gradually improved with steroids/remdesivir and empiric antibiotics-however on 1/12-1/13-hypoxia briefly worsened-chest x-ray showed a new left-sided infiltrate.  Hospital course was also complicated by development of AKI and hypernatremia.  Patient was provided supportive care with IV fluids, and was restarted on IV antibiotics (Unasyn)-with further improvement of hypoxemia.  See below for further details.    COVID-19 Medications:  Steroids: 1/6>> 1/15 Remdesivir: 1/6>>1/10 Convalescent Plasma: 1/7   Antibiotics: Unasyn: 1/14>> Cefepime: 1/6>>1/12 Vancomycin: 1/6>>1/8   Subjective:   Patient in bed, appears comfortable, denies any headache, no fever, no chest pain or pressure, improved shortness of breath , no abdominal  pain. No focal weakness.   Assessment  & Plan :   Acute Hypoxic Resp Failure due to Covid 19 Viral pneumonia and concurrent bacterial pneumonia with underlying amiodarone induced lung injury: she had severe COVID-19 pneumonia along with bacterial pneumonia, she has been treated so far with IV steroids, remdesivir and convalescent plasma for her COVID-19 infection along with vancomycin, cefepime and subsequently Unasyn/Augmentin.  She has finished her bacterial pneumonia treatment currently on steroids and Lasix as needed, overall still quite hypoxic, despite prolonged treatment she is not progressing as expected, gnosis is guarded, she is DNR, white  have to look into comfort measures soon if she does not turn around.  Brother has been informed in detail by me.  She has been placed on the combination of scheduled IV Lasix and Aldactone 07/20/2019, note she does take oral Lasix daily at home as well.  Encouraged to sit up in chair in the daytime use I-S and flutter valve for pulmonary toiletry and prone in bed at night if possible.  Also involve speech therapy to rule out ongoing aspiration.    O2 requirements:  SpO2: 95 % O2 Flow Rate (L/min): 8 L/min   COVID-19 Labs: Recent Labs    07/19/19 0115 07/20/19 0303 07/21/19 0228  DDIMER 0.91* 0.72* 1.23*  CRP 0.8 0.9 2.0*       Component Value Date/Time   BNP 275.4 (H) 07/19/2019 0115    Recent Labs  Lab 07/16/19 0506 07/17/19 0338 07/20/19 0303 07/21/19 0228  PROCALCITON 0.14 0.12 <0.10 0.15    Lab Results  Component Value Date   SARSCOV2NAA Detected (A) 06/26/2019   [place] NEGATIVE 02/20/2019   [place] Not Detected 12/18/2018    Prone/Incentive Spirometry: encouraged  incentive spirometry use 3-4/hour.   AKI on CKD stage IIIa: Likely due to fluid overload improving with Lasix.  Acute metabolic encephalopathy: This seems to have resolved.  HTN: BP controlled-continue metoprolol  Persistent atrial fibrillation  s/p AV node ablation and PPM insertion: Continue metoprolol-on Xarelto.  Rate under good control.  Paced rhythm on telemetry.  Note has history of amiodarone induced lung toxicity  Chronic combined systolic and diastolic heart failure percent on echocardiogram in 02/2019: Has been adequately diuresed and now on scheduled Lasix and Aldactone combination.  Bronchial asthma: Appears stable-no rhonchi-continue bronchodilators.  DM-2 (A1c 6.8): Hypoglycemic episode due to tapering of steroids, have cut down sliding scale and discontinued Lantus.  Will monitor.  CBG (last 3)  Recent Labs    07/20/19 1630 07/20/19 2017 07/21/19 0709  GLUCAP 296* 273* 74   Hypothyroidism: Continue levothyroxine  History of amiodarone induced lung toxicity/ILD: Supportive care.  Remote history of ASD repair  Deconditioning/debility: Appears very weak from acute illness-plans are for SNF when she is closer to discharge.    Addendum.  Late in the evening patient had some stool mixed with blood.  Likely hemorrhoidal, repeat CBC, monitor H&H, IV PPI.  Liquid diet for now.     Consults  :  PCCM  Procedures  :  None  Condition - stable  Family Communication  : Brother updated on 07/16/2019. Prognosis, if" worsening despite aggressive medical treatment then comfort measures will be initiated.  DNR now. Discussed again 07/19/19.  Code Status :  DNR  Diet :  Diet Order            DIET DYS 3 Room service appropriate? Yes; Fluid consistency: Thin  Diet effective now               Disposition Plan  :  SNF.  Barriers to discharge: Hypoxia requiring O2 supplementation  Antimicorbials  :    Anti-infectives (From admission, onward)   Start     Dose/Rate Route Frequency Ordered Stop   07/15/19 1000  amoxicillin-clavulanate (AUGMENTIN) 500-125 MG per tablet 500 mg    Note to Pharmacy: Pharmacy can adjust for aspiration pneumonia, stop date 07/17/2019   1 tablet Oral 2 times daily 07/15/19 0754 07/17/19  2100   07/10/19 1000  ampicillin-sulbactam (UNASYN) 1.5 g in sodium chloride 0.9 % 100 mL IVPB  Status:  Discontinued  1.5 g 200 mL/hr over 30 Minutes Intravenous Every 8 hours 07/10/19 0945 07/15/19 0754   07/04/19 1830  vancomycin (VANCOREADY) IVPB 750 mg/150 mL  Status:  Discontinued     750 mg 150 mL/hr over 60 Minutes Intravenous Every 48 hours 07/03/19 1249 07/04/19 1110   07/04/19 1800  vancomycin (VANCOCIN) IVPB 1000 mg/200 mL premix  Status:  Discontinued     1,000 mg 200 mL/hr over 60 Minutes Intravenous Every 48 hours 07/04/2019 1727 07/03/19 1249   07/03/19 1800  ceFEPIme (MAXIPIME) 1 g in sodium chloride 0.9 % 100 mL IVPB  Status:  Discontinued     1 g 200 mL/hr over 30 Minutes Intravenous Every 24 hours 07/05/2019 1725 07/03/19 1248   07/03/19 1800  ceFEPIme (MAXIPIME) 2 g in sodium chloride 0.9 % 100 mL IVPB     2 g 200 mL/hr over 30 Minutes Intravenous Every 24 hours 07/03/19 1247 07/08/19 1850   07/03/19 1000  remdesivir 100 mg in sodium chloride 0.9 % 100 mL IVPB     100 mg 200 mL/hr over 30 Minutes Intravenous Daily 07/25/2019 1723 07/06/19 0926   07/03/19 1000  remdesivir 100 mg in sodium chloride 0.9 % 100 mL IVPB  Status:  Discontinued     100 mg 200 mL/hr over 30 Minutes Intravenous Daily 07/09/2019 2355 07/03/19 0257   06/28/2019 2354  remdesivir 200 mg in sodium chloride 0.9% 250 mL IVPB  Status:  Discontinued     200 mg 580 mL/hr over 30 Minutes Intravenous Once 07/04/2019 2355 07/03/19 0257   07/21/2019 1800  remdesivir 200 mg in sodium chloride 0.9% 250 mL IVPB     200 mg 580 mL/hr over 30 Minutes Intravenous Once 07/05/2019 1723 07/23/2019 2306   07/19/2019 1730  vancomycin (VANCOCIN) IVPB 1000 mg/200 mL premix  Status:  Discontinued     1,000 mg 200 mL/hr over 60 Minutes Intravenous  Once 07/14/2019 1722 06/28/2019 1725   07/19/2019 1730  ceFEPIme (MAXIPIME) 2 g in sodium chloride 0.9 % 100 mL IVPB     2 g 200 mL/hr over 30 Minutes Intravenous  Once 07/22/2019 1722 07/05/2019 1918    07/09/2019 1730  vancomycin (VANCOREADY) IVPB 1500 mg/300 mL     1,500 mg 150 mL/hr over 120 Minutes Intravenous  Once 07/01/2019 1725 07/25/2019 2036      DVT Prophylaxis  : Xarelto  Inpatient Medications  Scheduled Meds: . vitamin C  500 mg Oral Daily  . fluticasone furoate-vilanterol  2 puff Inhalation Daily  . furosemide  40 mg Intravenous Daily  . insulin aspart  0-5 Units Subcutaneous QHS  . insulin aspart  0-9 Units Subcutaneous TID WC  . levothyroxine  100 mcg Oral QAC breakfast  . methylPREDNISolone (SOLU-MEDROL) injection  40 mg Intravenous Daily  . metoprolol succinate  50 mg Oral Daily  . pantoprazole  40 mg Oral BID  . pravastatin  10 mg Oral Daily  . Rivaroxaban  15 mg Oral Q breakfast  . spironolactone  25 mg Oral Daily  . zinc sulfate  220 mg Oral Daily   Continuous Infusions:  PRN Meds:.acetaminophen, albuterol, chlorpheniramine-HYDROcodone, guaiFENesin-dextromethorphan, lip balm, Melatonin, ondansetron (ZOFRAN) IV, senna-docusate   Time Spent in minutes 25  See all Orders from today for further details   Lala Lund M.D on 07/21/2019 at 10:14 AM  To page go to [email or web address] - use universal password  [facility] -  Office  [phone]    Objective:   Vitals:   07/20/19 2010 07/21/19  0115 07/21/19 0711 07/21/19 0830  BP: 111/78 99/72 102/77   Pulse: 76 70 79 72  Resp: 20 18 20    Temp: (!) 97.5 F (36.4 C) (!) 97.5 F (36.4 C) (!) 97.5 F (36.4 C)   TempSrc: Axillary Axillary Axillary   SpO2: 100% 93% 98% 95%  Weight:      Height:        Wt Readings from Last 3 Encounters:  07/17/2019 59.9 kg  04/01/19 65.8 kg  04/01/19 64.4 kg     Intake/Output Summary (Last 24 hours) at 07/21/2019 1014 Last data filed at 07/20/2019 1900 Gross per 24 hour  Intake --  Output 650 ml  Net -650 ml     Physical Exam  Awake Alert,  No new F.N deficits, Normal affect [place].AT,PERRAL Supple Neck,No JVD, No cervical lymphadenopathy appriciated.   Symmetrical Chest wall movement, Good air movement bilaterally, CTAB RRR,No Gallops, Rubs or new Murmurs, No Parasternal Heave +ve B.Sounds, Abd Soft, No tenderness, No organomegaly appriciated, No rebound - guarding or rigidity. No Cyanosis, Clubbing or edema, No new Rash or bruise    Data Review:    CBC Recent Labs  Lab 07/17/19 0338 07/18/19 0128 07/19/19 0115 07/20/19 0303 07/21/19 0228  WBC 16.2* 15.5* 16.3* 15.8* 17.8*  HGB 14.9 14.9 14.9 15.6* 16.1*  HCT 45.9 46.6* 47.2* 48.9* 51.0*  PLT 148* PLATELET CLUMPS NOTED ON SMEAR, UNABLE TO ESTIMATE 159 145* 160  MCV 98.1 97.5 99.0 99.2 98.5  MCH 31.8 31.2 31.2 31.6 31.1  MCHC 32.5 32.0 31.6 31.9 31.6  RDW 14.2 14.2 14.4 14.1 14.1  LYMPHSABS 0.7 0.8 1.2 1.0 1.1  MONOABS 0.4 0.9 1.0 1.1* 0.9  EOSABS 0.0 0.0 0.2 0.2 0.2  BASOSABS 0.0 0.0 0.1 0.0 0.1    Chemistries  Recent Labs  Lab 07/17/19 0338 07/18/19 0128 07/19/19 0115 07/20/19 0303 07/21/19 0228  NA 137 141 139 139 137  K 4.4 3.6 4.1 4.6 4.7  CL 93* 94* 94* 93* 92*  CO2 32 34* 33* 32 31  GLUCOSE 238* 93 97 22* 36*  BUN 61* 66* 71* 58* 65*  CREATININE 1.17* 1.13* 1.22* 1.21* 1.25*  CALCIUM 9.1 9.1 9.2 9.2 9.3  MG 2.0 2.1 2.2 2.1 2.1  AST 32 29 38 36 25  ALT 67* 59* 60* 61* 51*  ALKPHOS 234* 223* 214* 216* 204*  BILITOT 1.1 1.3* 0.9 1.0 1.0   ------------------------------------------------------------------------------------------------------------------ No results for input(s): CHOL, HDL, LDLCALC, TRIG, CHOLHDL, LDLDIRECT in the last 72 hours.  Lab Results  Component Value Date   HGBA1C 6.8 (H) 07/06/2019   ------------------------------------------------------------------------------------------------------------------ No results for input(s): TSH, T4TOTAL, T3FREE, THYROIDAB in the last 72 hours.  Invalid input(s): FREET3 ------------------------------------------------------------------------------------------------------------------ No results  for input(s): VITAMINB12, FOLATE, FERRITIN, TIBC, IRON, RETICCTPCT in the last 72 hours.  Coagulation profile No results for input(s): INR, PROTIME in the last 168 hours.  Recent Labs    07/20/19 0303 07/21/19 0228  DDIMER 0.72* 1.23*    Cardiac Enzymes No results for input(s): CKMB, TROPONINI, MYOGLOBIN in the last 168 hours.  Invalid input(s): CK ------------------------------------------------------------------------------------------------------------------    Component Value Date/Time   BNP 275.4 (H) 07/19/2019 0115    Micro Results No results found for this or any previous visit (from the past 240 hour(s)).  Radiology Reports DG Chest Port 1 View  Result Date: 07/16/2019 CLINICAL DATA:  Shortness of breath. EXAM: PORTABLE CHEST 1 VIEW COMPARISON:  07/09/2019 and CT chest 06/12/2017. FINDINGS: Trachea is midline. Heart is enlarged, stable.  Pacemaker lead tips are stable in position. Worsening diffuse bilateral airspace opacification with left lower lobe consolidation. No definite pleural fluid. Thyroidectomy clips. Probable bone infarct or enchondroma in the proximal right humerus. IMPRESSION: Worsening bilateral airspace opacification, consistent with worsening COVID-19 pneumonia. Electronically Signed   By: Lorin Picket M.D.   On: 07/16/2019 09:04   DG CHEST PORT 1 VIEW  Result Date: 07/09/2019 CLINICAL DATA:  Shortness of breath EXAM: PORTABLE CHEST 1 VIEW COMPARISON:  07/06/2019 FINDINGS: Patchy bilateral airspace disease again noted, worsening since prior study, particularly in the left lower lobe. Cardiomegaly. Pacer remains in place, unchanged. No effusions or pneumothorax. No acute bony abnormality. IMPRESSION: Patchy bilateral airspace opacities, worsening since prior study, particularly in the left lower lobe. Electronically Signed   By: Rolm Baptise M.D.   On: 07/09/2019 21:47   DG Chest Port 1 View  Result Date: 07/03/2019 CLINICAL DATA:  COVID-19 positive.   Worsening shortness of breath. EXAM: PORTABLE CHEST 1 VIEW COMPARISON:  07/13/2019. FINDINGS: Surgical clips noted over the neck. AICD in stable position. Stable cardiomegaly. Diffuse severe bilateral pulmonary infiltrates again noted. Slight improvement in aeration on today's exam. No pleural effusion pneumothorax. Old infarct proximal right humerus. No acute bony abnormality. IMPRESSION: 1.  AICD in stable position.  Stable cardiomegaly. 2. Diffuse severe bilateral pulmonary infiltrates again noted. Slight improvement in aeration on today's exam. Electronically Signed   By: Marcello Moores  Register   On: 07/03/2019 07:15   DG Chest Port 1 View  Result Date: 07/12/2019 CLINICAL DATA:  Hypoxia. EXAM: PORTABLE CHEST 1 VIEW COMPARISON:  None. FINDINGS: There is a dual lead AICD. Moderate severity diffuse bilateral infiltrates are seen. There is a small right pleural effusion. No pneumothorax is identified. The cardiac silhouette is markedly enlarged. Multiple radiopaque surgical clips are seen overlying the superior mediastinum. There is marked severity levoscoliosis of the thoracic spine with moderate severity multilevel degenerative changes. IMPRESSION: 1. Moderate severity diffuse bilateral infiltrates with a small right pleural effusion. 2. Marked enlargement of the cardiac silhouette. 3. Dual lead AICD in place. Electronically Signed   By: Virgina Norfolk M.D.   On: 06/28/2019 15:50   DG Chest Port 1V today  Result Date: 07/06/2019 CLINICAL DATA:  Dyspnea, COVID-19 positive EXAM: PORTABLE CHEST 1 VIEW COMPARISON:  07/03/2019 chest radiograph. FINDINGS: Stable configuration of 2 lead left subclavian pacemaker. Surgical clips overlie the central lower neck. Stable cardiomediastinal silhouette with mild cardiomegaly. No pneumothorax. No pleural effusion. Patchy hazy opacities throughout the mid to lower lungs bilaterally, similar to minimally improved. IMPRESSION: Patchy hazy opacities throughout the mid to lower  lungs bilaterally, similar to minimally improved, compatible with COVID-19 pneumonia. Stable cardiomegaly. Electronically Signed   By: Ilona Sorrel M.D.   On: 07/06/2019 13:09

## 2019-07-22 LAB — COMPREHENSIVE METABOLIC PANEL
ALT: 50 U/L — ABNORMAL HIGH (ref 0–44)
AST: 36 U/L (ref 15–41)
Albumin: 2.8 g/dL — ABNORMAL LOW (ref 3.5–5.0)
Alkaline Phosphatase: 187 U/L — ABNORMAL HIGH (ref 38–126)
Anion gap: 12 (ref 5–15)
BUN: 76 mg/dL — ABNORMAL HIGH (ref 8–23)
CO2: 31 mmol/L (ref 22–32)
Calcium: 8.9 mg/dL (ref 8.9–10.3)
Chloride: 91 mmol/L — ABNORMAL LOW (ref 98–111)
Creatinine, Ser: 1.47 mg/dL — ABNORMAL HIGH (ref 0.44–1.00)
GFR calc Af Amer: 44 mL/min — ABNORMAL LOW (ref >60)
GFR calc non Af Amer: 38 mL/min — ABNORMAL LOW (ref >60)
Glucose, Bld: 34 mg/dL — CL (ref 70–99)
Potassium: 6 mmol/L — ABNORMAL HIGH (ref 3.5–5.1)
Sodium: 134 mmol/L — ABNORMAL LOW (ref 135–145)
Total Bilirubin: 1 mg/dL (ref 0.3–1.2)
Total Protein: 7 g/dL (ref 6.5–8.1)

## 2019-07-22 LAB — GLUCOSE, CAPILLARY
Glucose-Capillary: 79 mg/dL (ref 70–99)
Glucose-Capillary: 237 mg/dL — ABNORMAL HIGH (ref 70–99)
Glucose-Capillary: 356 mg/dL — ABNORMAL HIGH (ref 70–99)
Glucose-Capillary: 500 mg/dL — ABNORMAL HIGH (ref 70–99)
Glucose-Capillary: 395 mg/dL — ABNORMAL HIGH (ref 70–99)

## 2019-07-22 LAB — CBC
HCT: 46.3 % — ABNORMAL HIGH (ref 36.0–46.0)
Hemoglobin: 14.9 g/dL (ref 12.0–15.0)
MCH: 32 pg (ref 26.0–34.0)
MCHC: 32.2 g/dL (ref 30.0–36.0)
MCV: 99.4 fL (ref 80.0–100.0)
Platelets: 134 10*3/uL — ABNORMAL LOW (ref 150–400)
RBC: 4.66 MIL/uL (ref 3.87–5.11)
RDW: 14.1 % (ref 11.5–15.5)
WBC: 20.4 10*3/uL — ABNORMAL HIGH (ref 4.0–10.5)
nRBC: 0 % (ref 0.0–0.2)

## 2019-07-22 LAB — CBC WITH DIFFERENTIAL/PLATELET
Abs Immature Granulocytes: 0.14 10*3/uL — ABNORMAL HIGH (ref 0.00–0.07)
Basophils Absolute: 0.1 10*3/uL (ref 0.0–0.1)
Basophils Relative: 0 %
Eosinophils Absolute: 0.2 10*3/uL (ref 0.0–0.5)
Eosinophils Relative: 1 %
HCT: 49.7 % — ABNORMAL HIGH (ref 36.0–46.0)
Hemoglobin: 15.8 g/dL — ABNORMAL HIGH (ref 12.0–15.0)
Immature Granulocytes: 1 %
Lymphocytes Relative: 6 %
Lymphs Abs: 1 10*3/uL (ref 0.7–4.0)
MCH: 31.2 pg (ref 26.0–34.0)
MCHC: 31.8 g/dL (ref 30.0–36.0)
MCV: 98.2 fL (ref 80.0–100.0)
Monocytes Absolute: 0.8 10*3/uL (ref 0.1–1.0)
Monocytes Relative: 5 %
Neutro Abs: 14.7 10*3/uL — ABNORMAL HIGH (ref 1.7–7.7)
Neutrophils Relative %: 87 %
Platelets: 154 10*3/uL (ref 150–400)
RBC: 5.06 MIL/uL (ref 3.87–5.11)
RDW: 14 % (ref 11.5–15.5)
WBC: 16.8 10*3/uL — ABNORMAL HIGH (ref 4.0–10.5)
nRBC: 0 % (ref 0.0–0.2)

## 2019-07-22 LAB — TYPE AND SCREEN
ABO/RH(D): A POS
Antibody Screen: NEGATIVE

## 2019-07-22 LAB — MAGNESIUM: Magnesium: 2.2 mg/dL (ref 1.7–2.4)

## 2019-07-22 LAB — PROCALCITONIN: Procalcitonin: 0.19 ng/mL

## 2019-07-22 LAB — D-DIMER, QUANTITATIVE: D-Dimer, Quant: 0.77 ug/mL-FEU — ABNORMAL HIGH (ref 0.00–0.50)

## 2019-07-22 LAB — C-REACTIVE PROTEIN: CRP: 1.6 mg/dL — ABNORMAL HIGH (ref <1.0)

## 2019-07-22 MED ORDER — METHYLPREDNISOLONE SODIUM SUCC 40 MG IJ SOLR
20.0000 mg | Freq: Every day | INTRAMUSCULAR | Status: DC
  Administered 2019-07-23: 20 mg via INTRAVENOUS
  Filled 2019-07-22: qty 1

## 2019-07-22 MED ORDER — METHYLPREDNISOLONE SODIUM SUCC 40 MG IJ SOLR
30.0000 mg | Freq: Every day | INTRAMUSCULAR | Status: DC
  Administered 2019-07-22: 09:00:00 30 mg via INTRAVENOUS
  Filled 2019-07-22: qty 1

## 2019-07-22 MED ORDER — FUROSEMIDE 20 MG PO TABS: 40.0000 mg | ORAL_TABLET | Freq: Two times a day (BID) | ORAL | Status: DC

## 2019-07-22 MED ORDER — CALCIUM CARBONATE ANTACID 500 MG PO CHEW
1.0000 | CHEWABLE_TABLET | Freq: Three times a day (TID) | ORAL | Status: DC | PRN
  Administered 2019-07-22: 200 mg via ORAL
  Filled 2019-07-22 (×3): qty 1

## 2019-07-22 MED ORDER — INSULIN ASPART 100 UNIT/ML ~~LOC~~ SOLN
0.0000 [IU] | Freq: Every day | SUBCUTANEOUS | Status: DC
  Administered 2019-07-22 – 2019-07-24 (×2): 5 [IU] via SUBCUTANEOUS

## 2019-07-22 MED ORDER — INSULIN ASPART 100 UNIT/ML ~~LOC~~ SOLN
0.0000 [IU] | Freq: Three times a day (TID) | SUBCUTANEOUS | Status: DC
  Administered 2019-07-22: 15 [IU] via SUBCUTANEOUS
  Administered 2019-07-23: 5 [IU] via SUBCUTANEOUS

## 2019-07-22 MED ORDER — SODIUM POLYSTYRENE SULFONATE 15 GM/60ML PO SUSP
30.0000 g | Freq: Once | ORAL | Status: AC
  Administered 2019-07-22: 30 g via ORAL
  Filled 2019-07-22: qty 120

## 2019-07-22 MED ORDER — PANTOPRAZOLE SODIUM 40 MG IV SOLR
40.0000 mg | Freq: Two times a day (BID) | INTRAVENOUS | Status: DC
  Administered 2019-07-22 – 2019-07-28 (×12): 40 mg via INTRAVENOUS
  Filled 2019-07-22 (×12): qty 40

## 2019-07-22 NOTE — Progress Notes (Signed)
Occupational Therapy Treatment Patient Details Name: [name] MRN: [id number] DOB: May 14, 1956 Today's Date: 07/22/2019    History of present illness 64 y.o. female, PMHx including amiodarone toxicity, interstitial lung disease, chronic systolic/diastolic heart failure, GERD,  history of congenital heart disease, DM II, iron deficient anemia, hypertension,hyperparathyroidism, peripheral vascular disease, renal insufficiency, persistent A. fib anticoagulated on Xarelto presented to ED w/ shortness of breath, patient was recently diagnosed 12/31 with COVID-19, at baseline has no oxygen requirement, patient reported her symptoms started on 1/3, with progressive dyspnea, fever, chills, cough, neurolyse body ache, denies nausea, vomiting, diarrhea, chest pain, leg edema, or orthopnea. in ED was febrile 103, hypoxic in the mid 80s, requiring initially 6 L nasal cannula, chest x-ray significant for diffuse bilateral opacities, pt admited for COVID-19 for pneumonia.   OT comments  Patient on commode on arrival.  She stated she was having trouble having a bowel movement but needed to go.  She was on 3L O2 at start and SpO2 in mid 90s.  With standing she felt dizziness and blood pressure decreased to 71/50.  SpO2 also dropped to low 80s.  Increased O2 to 6L.  BP increased after sitting.  Stool appeared bright red/bloody, notified RN and MD.  She needed mod assist with toileting.  Patient required min guard with toilet transfer using rolling walker.  Will continue to follow with OT acutely to address the deficits listed below.     Follow Up Recommendations  SNF;CIR    Equipment Recommendations  3 in 1 bedside commode    Recommendations for Other Services      Precautions / Restrictions Precautions Precautions: Fall;Other (comment) Precaution Comments: Monitor oxygen; desats with exertion Restrictions Weight Bearing Restrictions: No       Mobility Bed Mobility               General  bed mobility comments: Pt up in chair  Transfers Overall transfer level: Needs assistance Equipment used: Rolling walker (2 wheeled) Transfers: Sit to/from Stand Sit to Stand: Min guard Stand pivot transfers: Min guard            Balance Overall balance assessment: Needs assistance Sitting-balance support: Feet unsupported Sitting balance-Leahy Scale: Fair     Standing balance support: During functional activity;Bilateral upper extremity supported Standing balance-Leahy Scale: Fair                             ADL either performed or assessed with clinical judgement   ADL Overall ADL's : Needs assistance/impaired                         Toilet Transfer: Min guard;Stand-pivot;RW;BSC   Toileting- Clothing Manipulation and Hygiene: Moderate assistance;Sit to/from stand       Functional mobility during ADLs: Min guard;Rolling walker General ADL Comments: Patient experienced blood pressure drop with standing     Vision       Perception     Praxis      Cognition Arousal/Alertness: Awake/alert Behavior During Therapy: WFL for tasks assessed/performed Overall Cognitive Status: No family/caregiver present to determine baseline cognitive functioning Area of Impairment: Following commands;Safety/judgement;Awareness;Problem solving                   Current Attention Level: Selective   Following Commands: Follows multi-step commands inconsistently Safety/Judgement: Decreased awareness of safety Awareness: Emergent Problem Solving: Slow processing;Requires verbal cues  Exercises Other Exercises Other Exercises: Pursed lip breathing   Shoulder Instructions       General Comments Blood pressure drop to 71/50 while standing     Pertinent Vitals/ Pain       Pain Assessment: Faces Faces Pain Scale: Hurts little more Pain Location: Some pain with bowel movement/hygiene Pain Descriptors / Indicators:  Grimacing;Discomfort Pain Intervention(s): Limited activity within patient's tolerance  Home Living                                          Prior Functioning/Environment              Frequency  Min 3X/week        Progress Toward Goals  OT Goals(current goals can now be found in the care plan section)  Progress towards OT goals: Progressing toward goals  Acute Rehab OT Goals Patient Stated Goal: To go home Time For Goal Achievement: 08/05/19 Potential to Achieve Goals: Good  Plan Discharge plan remains appropriate    Co-evaluation                 AM-PAC OT "6 Clicks" Daily Activity     Outcome Measure   Help from another person eating meals?: None Help from another person taking care of personal grooming?: A Little Help from another person toileting, which includes using toliet, bedpan, or urinal?: A Lot Help from another person bathing (including washing, rinsing, drying)?: A Lot Help from another person to put on and taking off regular upper body clothing?: A Little Help from another person to put on and taking off regular lower body clothing?: A Lot 6 Click Score: 16    End of Session Equipment Utilized During Treatment: Oxygen;Rolling walker  OT Visit Diagnosis: Unsteadiness on feet (R26.81);Muscle weakness (generalized) (M62.81)   Activity Tolerance Patient limited by fatigue;Treatment limited secondary to medical complications (Comment)(likely orthostatic hypotension)   Patient Left in chair;with call bell/phone within reach;with chair alarm set   Nurse Communication Mobility status        Time: 1532-1600 OT Time Calculation (min): 28 min  Charges: OT General Charges $OT Visit: 1 Visit OT Treatments $Self Care/Home Management : 23-37 mins  August Luz, OTR/L   Phylliss Bob 07/22/2019, 4:16 PM

## 2019-07-22 NOTE — Progress Notes (Signed)
PROGRESS NOTE                                                                                                                                                                                                             Patient Demographics:    [name], is a 64 y.o. female, DOB - Sep 02, 1955, [id number]  Outpatient Primary MD for the patient is Celene Squibb, MD   Admit date - 07/03/2019   LOS - 20  Chief Complaint  Patient presents with  . Shortness of Breath  . covid pos       Brief Narrative:  Patient is a 64 y.o. 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    Post admission-she had significant worsening of her hypoxemia-requiring 100% NRB-she was then transferred to [facility].  Hypoxia gradually improved with steroids/remdesivir and empiric antibiotics-however on 1/12-1/13-hypoxia briefly worsened-chest x-ray showed a new left-sided infiltrate.  Hospital course was also complicated by development of AKI and hypernatremia.  Patient was provided supportive care with IV fluids, and was restarted on IV antibiotics (Unasyn)-with further improvement of hypoxemia.  See below for further details.    COVID-19 Medications:  Steroids: 1/6>> 1/15 Remdesivir: 1/6>>1/10 Convalescent Plasma: 1/7   Antibiotics: Unasyn: 1/14>> Cefepime: 1/6>>1/12 Vancomycin: 1/6>>1/8   Subjective:   Patient in bed, appears comfortable, denies any headache, no fever, no chest pain or pressure, no shortness of breath , no abdominal pain. No  focal weakness.  Has some cyanosis in both feet for the last several days but no pain, mild tingling only.   Assessment  & Plan :   Acute Hypoxic Resp Failure due to Covid 19 Viral pneumonia and concurrent bacterial pneumonia with underlying amiodarone induced lung injury: she had severe COVID-19 pneumonia along with bacterial pneumonia, she has been treated so far with IV steroids, remdesivir and convalescent plasma for her COVID-19 infection along with vancomycin, cefepime and subsequently Unasyn/Augmentin.  She has finished her treatment for both COVID-19 and bacterial pneumonia, has been diuresed with Lasix  adequately, steroids being tapered.  After a prolonged course she is now starting to show good improvement and is down to 4 L of oxygen, continue supportive care, encourage activity, continue to taper off steroids, oxygen.  Speech therapy is following the patient continue dysphagia 3 diet.  Continue using I-S and flutter valve for pulmonary toiletry.   Discussed with patient's brother on 07/22/2019, he would like to take her home with home health and PT if possible once she is slightly better.  Hopefully discharge in the next 2 to 3 days.  O2 requirements:  SpO2: 97 % O2 Flow Rate (L/min): 4 L/min   COVID-19 Labs: Recent Labs    07/20/19 0303 07/21/19 0228 07/22/19 0246  DDIMER 0.72* 1.23* 0.77*  CRP 0.9 2.0* 1.6*       Component Value Date/Time   BNP 275.4 (H) 07/19/2019 0115    Recent Labs  Lab 07/17/19 0338 07/20/19 0303 07/21/19 0228 07/22/19 0246  PROCALCITON 0.12 <0.10 0.15 0.19    Lab Results  Component Value Date   SARSCOV2NAA Detected (A) 06/26/2019   [place] NEGATIVE 02/20/2019   [place] Not Detected 12/18/2018    Prone/Incentive Spirometry: encouraged  incentive spirometry use 3-4/hour.   AKI on CKD stage IIIa: Likely due to fluid overload improving with Lasix.  Acute metabolic encephalopathy: This seems to have resolved.  HTN: BP  controlled-continue metoprolol  Persistent atrial fibrillation s/p AV node ablation and PPM insertion: Continue metoprolol-on Xarelto.  Rate under good control.  Paced rhythm on telemetry.  Note has history of amiodarone induced lung toxicity  Chronic combined systolic and diastolic heart failure percent on echocardiogram in 02/2019: Has been adequately diuresed and now on scheduled Lasix and Aldactone combination.  Bronchial asthma: Appears stable-no rhonchi-continue bronchodilators.  DM-2 (A1c 6.8): Hypoglycemic episode due to tapering of steroids, have cut down sliding scale and discontinued Lantus.  Will monitor.  CBG (last 3)  Recent Labs    07/21/19 1713 07/21/19 2023 07/22/19 0735  GLUCAP 399* 328* 79   Hypothyroidism: Continue levothyroxine  History of amiodarone induced lung toxicity/ILD: Supportive care.  Remote history of ASD repair - no acute issues.  Deconditioning/debility: Continue PT OT and speech therapy, patient and family are planning home with home health.  Hyperkalemia.  Kayexalate and monitor.  Cyanosis of both feet.  Likely due to mild hypoperfusion, digits are nonpainful, advised to staff to have the patient wear multiple socks and have warm blankets around her feet.  She has good sensation and movement.  Positive pulses.    Consults  :  PCCM  Procedures  :  None  Condition - stable  Family Communication  : Brother updated on 07/16/2019. Prognosis, if" worsening despite aggressive medical treatment then comfort measures will be initiated.  DNR now. Discussed again 07/19/19.  Discussed with patient's brother on 07/22/2019, he would like to take her home with home health and PT if possible once she is slightly better.  Hopefully discharge in the next 2 to 3 days.  Code Status :  DNR  Diet :  Diet Order            DIET DYS 3 Room service appropriate? Yes; Fluid consistency: Thin  Diet effective now               Disposition Plan  : Health PT  once oxygenation improves further.    Antimicorbials  :    Anti-infectives (From admission, onward)   Start     Dose/Rate Route Frequency Ordered  Stop   07/15/19 1000  amoxicillin-clavulanate (AUGMENTIN) 500-125 MG per tablet 500 mg    Note to Pharmacy: Pharmacy can adjust for aspiration pneumonia, stop date 07/17/2019   1 tablet Oral 2 times daily 07/15/19 0754 07/17/19 2100   07/10/19 1000  ampicillin-sulbactam (UNASYN) 1.5 g in sodium chloride 0.9 % 100 mL IVPB  Status:  Discontinued     1.5 g 200 mL/hr over 30 Minutes Intravenous Every 8 hours 07/10/19 0945 07/15/19 0754   07/04/19 1830  vancomycin (VANCOREADY) IVPB 750 mg/150 mL  Status:  Discontinued     750 mg 150 mL/hr over 60 Minutes Intravenous Every 48 hours 07/03/19 1249 07/04/19 1110   07/04/19 1800  vancomycin (VANCOCIN) IVPB 1000 mg/200 mL premix  Status:  Discontinued     1,000 mg 200 mL/hr over 60 Minutes Intravenous Every 48 hours 07/18/2019 1727 07/03/19 1249   07/03/19 1800  ceFEPIme (MAXIPIME) 1 g in sodium chloride 0.9 % 100 mL IVPB  Status:  Discontinued     1 g 200 mL/hr over 30 Minutes Intravenous Every 24 hours 06/29/2019 1725 07/03/19 1248   07/03/19 1800  ceFEPIme (MAXIPIME) 2 g in sodium chloride 0.9 % 100 mL IVPB     2 g 200 mL/hr over 30 Minutes Intravenous Every 24 hours 07/03/19 1247 07/08/19 1850   07/03/19 1000  remdesivir 100 mg in sodium chloride 0.9 % 100 mL IVPB     100 mg 200 mL/hr over 30 Minutes Intravenous Daily 07/23/2019 1723 07/06/19 0926   07/03/19 1000  remdesivir 100 mg in sodium chloride 0.9 % 100 mL IVPB  Status:  Discontinued     100 mg 200 mL/hr over 30 Minutes Intravenous Daily 07/27/2019 2355 07/03/19 0257   07/22/2019 2354  remdesivir 200 mg in sodium chloride 0.9% 250 mL IVPB  Status:  Discontinued     200 mg 580 mL/hr over 30 Minutes Intravenous Once 07/24/2019 2355 07/03/19 0257   06/29/2019 1800  remdesivir 200 mg in sodium chloride 0.9% 250 mL IVPB     200 mg 580 mL/hr over 30 Minutes  Intravenous Once 07/26/2019 1723 07/01/2019 2306   07/01/2019 1730  vancomycin (VANCOCIN) IVPB 1000 mg/200 mL premix  Status:  Discontinued     1,000 mg 200 mL/hr over 60 Minutes Intravenous  Once 07/13/2019 1722 07/05/2019 1725   07/01/2019 1730  ceFEPIme (MAXIPIME) 2 g in sodium chloride 0.9 % 100 mL IVPB     2 g 200 mL/hr over 30 Minutes Intravenous  Once 06/27/2019 1722 07/16/2019 1918   07/22/2019 1730  vancomycin (VANCOREADY) IVPB 1500 mg/300 mL     1,500 mg 150 mL/hr over 120 Minutes Intravenous  Once 07/16/2019 1725 07/26/2019 2036      DVT Prophylaxis  : Xarelto  Inpatient Medications  Scheduled Meds: . vitamin C  500 mg Oral Daily  . fluticasone furoate-vilanterol  2 puff Inhalation Daily  . [START ON 07/23/2019] furosemide  40 mg Oral BID  . levothyroxine  100 mcg Oral QAC breakfast  . methylPREDNISolone (SOLU-MEDROL) injection  30 mg Intravenous Daily  . metoprolol succinate  50 mg Oral Daily  . pantoprazole  40 mg Oral BID  . pravastatin  10 mg Oral Daily  . Rivaroxaban  15 mg Oral Q breakfast  . zinc sulfate  220 mg Oral Daily   Continuous Infusions:  PRN Meds:.acetaminophen, albuterol, calcium carbonate, chlorpheniramine-HYDROcodone, guaiFENesin-dextromethorphan, lip balm, Melatonin, ondansetron (ZOFRAN) IV, senna-docusate   Time Spent in minutes 25  See all Orders from  today for further details   Lala Lund M.D on 07/22/2019 at 9:45 AM  To page go to [email or web address] - use universal password  [facility] -  Office  [phone]    Objective:   Vitals:   07/21/19 1714 07/21/19 2021 07/22/19 0105 07/22/19 0800  BP: 110/71 110/74 114/72 107/76  Pulse: 71 72 77   Resp: 20 20 20 18   Temp: 97.6 F (36.4 C) 97.7 F (36.5 C) 97.9 F (36.6 C) 98.2 F (36.8 C)  TempSrc: Axillary Oral Oral Oral  SpO2: 96% 98% 97%   Weight:      Height:        Wt Readings from Last 3 Encounters:  07/16/2019 59.9 kg  04/01/19 65.8 kg  04/01/19 64.4 kg    No intake or output data  in the 24 hours ending 07/22/19 0945   Physical Exam  Awake Alert,  No new F.N deficits, Normal affect [place].AT,PERRAL Supple Neck,No JVD, No cervical lymphadenopathy appriciated.  Symmetrical Chest wall movement, Good air movement bilaterally, CTAB RRR,No Gallops, Rubs or new Murmurs, No Parasternal Heave +ve B.Sounds, Abd Soft, No tenderness, No organomegaly appriciated, No rebound - guarding or rigidity.  Cyanosis in both feet but +ve pulse non tender    Data Review:    CBC Recent Labs  Lab 07/18/19 0128 07/19/19 0115 07/20/19 0303 07/21/19 0228 07/22/19 0246  WBC 15.5* 16.3* 15.8* 17.8* 16.8*  HGB 14.9 14.9 15.6* 16.1* 15.8*  HCT 46.6* 47.2* 48.9* 51.0* 49.7*  PLT PLATELET CLUMPS NOTED ON SMEAR, UNABLE TO ESTIMATE 159 145* 160 154  MCV 97.5 99.0 99.2 98.5 98.2  MCH 31.2 31.2 31.6 31.1 31.2  MCHC 32.0 31.6 31.9 31.6 31.8  RDW 14.2 14.4 14.1 14.1 14.0  LYMPHSABS 0.8 1.2 1.0 1.1 1.0  MONOABS 0.9 1.0 1.1* 0.9 0.8  EOSABS 0.0 0.2 0.2 0.2 0.2  BASOSABS 0.0 0.1 0.0 0.1 0.1    Chemistries  Recent Labs  Lab 07/18/19 0128 07/19/19 0115 07/20/19 0303 07/21/19 0228 07/22/19 0246  NA 141 139 139 137 134*  K 3.6 4.1 4.6 4.7 6.0*  CL 94* 94* 93* 92* 91*  CO2 34* 33* 32 31 31  GLUCOSE 93 97 22* 36* 34*  BUN 66* 71* 58* 65* 76*  CREATININE 1.13* 1.22* 1.21* 1.25* 1.47*  CALCIUM 9.1 9.2 9.2 9.3 8.9  MG 2.1 2.2 2.1 2.1 2.2  AST 29 38 36 25 36  ALT 59* 60* 61* 51* 50*  ALKPHOS 223* 214* 216* 204* 187*  BILITOT 1.3* 0.9 1.0 1.0 1.0   ------------------------------------------------------------------------------------------------------------------ No results for input(s): CHOL, HDL, LDLCALC, TRIG, CHOLHDL, LDLDIRECT in the last 72 hours.  Lab Results  Component Value Date   HGBA1C 6.8 (H) 07/06/2019   ------------------------------------------------------------------------------------------------------------------ No results for input(s): TSH, T4TOTAL, T3FREE,  THYROIDAB in the last 72 hours.  Invalid input(s): FREET3 ------------------------------------------------------------------------------------------------------------------ No results for input(s): VITAMINB12, FOLATE, FERRITIN, TIBC, IRON, RETICCTPCT in the last 72 hours.  Coagulation profile No results for input(s): INR, PROTIME in the last 168 hours.  Recent Labs    07/21/19 0228 07/22/19 0246  DDIMER 1.23* 0.77*    Cardiac Enzymes No results for input(s): CKMB, TROPONINI, MYOGLOBIN in the last 168 hours.  Invalid input(s): CK ------------------------------------------------------------------------------------------------------------------    Component Value Date/Time   BNP 275.4 (H) 07/19/2019 0115    Micro Results No results found for this or any previous visit (from the past 240 hour(s)).  Radiology Reports DG Chest Port 1 View  Result Date: 07/16/2019 CLINICAL DATA:  Shortness of breath. EXAM: PORTABLE CHEST 1 VIEW COMPARISON:  07/09/2019 and CT chest 06/12/2017. FINDINGS: Trachea is midline. Heart is enlarged, stable. Pacemaker lead tips are stable in position. Worsening diffuse bilateral airspace opacification with left lower lobe consolidation. No definite pleural fluid. Thyroidectomy clips. Probable bone infarct or enchondroma in the proximal right humerus. IMPRESSION: Worsening bilateral airspace opacification, consistent with worsening COVID-19 pneumonia. Electronically Signed   By: Lorin Picket M.D.   On: 07/16/2019 09:04   DG CHEST PORT 1 VIEW  Result Date: 07/09/2019 CLINICAL DATA:  Shortness of breath EXAM: PORTABLE CHEST 1 VIEW COMPARISON:  07/06/2019 FINDINGS: Patchy bilateral airspace disease again noted, worsening since prior study, particularly in the left lower lobe. Cardiomegaly. Pacer remains in place, unchanged. No effusions or pneumothorax. No acute bony abnormality. IMPRESSION: Patchy bilateral airspace opacities, worsening since prior study,  particularly in the left lower lobe. Electronically Signed   By: Rolm Baptise M.D.   On: 07/09/2019 21:47   DG Chest Port 1 View  Result Date: 07/03/2019 CLINICAL DATA:  COVID-19 positive.  Worsening shortness of breath. EXAM: PORTABLE CHEST 1 VIEW COMPARISON:  07/15/2019. FINDINGS: Surgical clips noted over the neck. AICD in stable position. Stable cardiomegaly. Diffuse severe bilateral pulmonary infiltrates again noted. Slight improvement in aeration on today's exam. No pleural effusion pneumothorax. Old infarct proximal right humerus. No acute bony abnormality. IMPRESSION: 1.  AICD in stable position.  Stable cardiomegaly. 2. Diffuse severe bilateral pulmonary infiltrates again noted. Slight improvement in aeration on today's exam. Electronically Signed   By: Marcello Moores  Register   On: 07/03/2019 07:15   DG Chest Port 1 View  Result Date: 07/07/2019 CLINICAL DATA:  Hypoxia. EXAM: PORTABLE CHEST 1 VIEW COMPARISON:  None. FINDINGS: There is a dual lead AICD. Moderate severity diffuse bilateral infiltrates are seen. There is a small right pleural effusion. No pneumothorax is identified. The cardiac silhouette is markedly enlarged. Multiple radiopaque surgical clips are seen overlying the superior mediastinum. There is marked severity levoscoliosis of the thoracic spine with moderate severity multilevel degenerative changes. IMPRESSION: 1. Moderate severity diffuse bilateral infiltrates with a small right pleural effusion. 2. Marked enlargement of the cardiac silhouette. 3. Dual lead AICD in place. Electronically Signed   By: Virgina Norfolk M.D.   On: 06/27/2019 15:50   DG Chest Port 1V today  Result Date: 07/06/2019 CLINICAL DATA:  Dyspnea, COVID-19 positive EXAM: PORTABLE CHEST 1 VIEW COMPARISON:  07/03/2019 chest radiograph. FINDINGS: Stable configuration of 2 lead left subclavian pacemaker. Surgical clips overlie the central lower neck. Stable cardiomediastinal silhouette with mild cardiomegaly. No  pneumothorax. No pleural effusion. Patchy hazy opacities throughout the mid to lower lungs bilaterally, similar to minimally improved. IMPRESSION: Patchy hazy opacities throughout the mid to lower lungs bilaterally, similar to minimally improved, compatible with COVID-19 pneumonia. Stable cardiomegaly. Electronically Signed   By: Ilona Sorrel M.D.   On: 07/06/2019 13:09

## 2019-07-22 NOTE — Plan of Care (Signed)

## 2019-07-23 ENCOUNTER — Inpatient Hospital Stay (HOSPITAL_COMMUNITY): Payer: PPO

## 2019-07-23 ENCOUNTER — Inpatient Hospital Stay: Payer: Self-pay

## 2019-07-23 DIAGNOSIS — I9589 Other hypotension: Secondary | ICD-10-CM

## 2019-07-23 DIAGNOSIS — I4891 Unspecified atrial fibrillation: Secondary | ICD-10-CM

## 2019-07-23 DIAGNOSIS — J9600 Acute respiratory failure, unspecified whether with hypoxia or hypercapnia: Secondary | ICD-10-CM

## 2019-07-23 DIAGNOSIS — R6521 Severe sepsis with septic shock: Secondary | ICD-10-CM

## 2019-07-23 DIAGNOSIS — A419 Sepsis, unspecified organism: Secondary | ICD-10-CM

## 2019-07-23 DIAGNOSIS — I4811 Longstanding persistent atrial fibrillation: Secondary | ICD-10-CM

## 2019-07-23 LAB — CBC WITH DIFFERENTIAL/PLATELET
Abs Immature Granulocytes: 0.24 10*3/uL — ABNORMAL HIGH (ref 0.00–0.07)
Basophils Absolute: 0 10*3/uL (ref 0.0–0.1)
Basophils Relative: 0 %
Eosinophils Absolute: 0 10*3/uL (ref 0.0–0.5)
Eosinophils Relative: 0 %
HCT: 46.1 % — ABNORMAL HIGH (ref 36.0–46.0)
Hemoglobin: 14.9 g/dL (ref 12.0–15.0)
Immature Granulocytes: 1 %
Lymphocytes Relative: 2 %
Lymphs Abs: 0.4 10*3/uL — ABNORMAL LOW (ref 0.7–4.0)
MCH: 31.3 pg (ref 26.0–34.0)
MCHC: 32.3 g/dL (ref 30.0–36.0)
MCV: 96.8 fL (ref 80.0–100.0)
Monocytes Absolute: 0.1 10*3/uL (ref 0.1–1.0)
Monocytes Relative: 0 %
Neutro Abs: 19.4 10*3/uL — ABNORMAL HIGH (ref 1.7–7.7)
Neutrophils Relative %: 97 %
Platelets: ADEQUATE 10*3/uL (ref 150–400)
RBC: 4.76 MIL/uL (ref 3.87–5.11)
RDW: 14 % (ref 11.5–15.5)
WBC: 20.2 10*3/uL — ABNORMAL HIGH (ref 4.0–10.5)
nRBC: 0 % (ref 0.0–0.2)

## 2019-07-23 LAB — COMPREHENSIVE METABOLIC PANEL
ALT: 72 U/L — ABNORMAL HIGH (ref 0–44)
AST: 54 U/L — ABNORMAL HIGH (ref 15–41)
Albumin: 2.4 g/dL — ABNORMAL LOW (ref 3.5–5.0)
Alkaline Phosphatase: 212 U/L — ABNORMAL HIGH (ref 38–126)
Anion gap: 20 — ABNORMAL HIGH (ref 5–15)
BUN: 77 mg/dL — ABNORMAL HIGH (ref 8–23)
CO2: 27 mmol/L (ref 22–32)
Calcium: 8.5 mg/dL — ABNORMAL LOW (ref 8.9–10.3)
Chloride: 91 mmol/L — ABNORMAL LOW (ref 98–111)
Creatinine, Ser: 1.88 mg/dL — ABNORMAL HIGH (ref 0.44–1.00)
GFR calc Af Amer: 32 mL/min — ABNORMAL LOW (ref >60)
GFR calc non Af Amer: 28 mL/min — ABNORMAL LOW (ref >60)
Glucose, Bld: <20 mg/dL — CL (ref 70–99)
Potassium: 3.5 mmol/L (ref 3.5–5.1)
Sodium: 138 mmol/L (ref 135–145)
Total Bilirubin: 1.3 mg/dL — ABNORMAL HIGH (ref 0.3–1.2)
Total Protein: 6 g/dL — ABNORMAL LOW (ref 6.5–8.1)

## 2019-07-23 LAB — PROCALCITONIN: Procalcitonin: 26.79 ng/mL

## 2019-07-23 LAB — GLUCOSE, CAPILLARY
Glucose-Capillary: 162 mg/dL — ABNORMAL HIGH (ref 70–99)
Glucose-Capillary: 34 mg/dL — CL (ref 70–99)
Glucose-Capillary: 137 mg/dL — ABNORMAL HIGH (ref 70–99)
Glucose-Capillary: 158 mg/dL — ABNORMAL HIGH (ref 70–99)
Glucose-Capillary: 217 mg/dL — ABNORMAL HIGH (ref 70–99)
Glucose-Capillary: 338 mg/dL — ABNORMAL HIGH (ref 70–99)
Glucose-Capillary: 417 mg/dL — ABNORMAL HIGH (ref 70–99)

## 2019-07-23 LAB — MRSA PCR SCREENING: MRSA by PCR: NEGATIVE

## 2019-07-23 LAB — D-DIMER, QUANTITATIVE: D-Dimer, Quant: 2.21 ug/mL-FEU — ABNORMAL HIGH (ref 0.00–0.50)

## 2019-07-23 LAB — ECHOCARDIOGRAM COMPLETE
Height: 58 in
Weight: 2112.89 oz

## 2019-07-23 LAB — C-REACTIVE PROTEIN: CRP: 4.2 mg/dL — ABNORMAL HIGH (ref <1.0)

## 2019-07-23 LAB — MAGNESIUM: Magnesium: 1.7 mg/dL (ref 1.7–2.4)

## 2019-07-23 MED ORDER — CHLORHEXIDINE GLUCONATE CLOTH 2 % EX PADS
6.0000 | MEDICATED_PAD | Freq: Every day | CUTANEOUS | Status: DC
  Administered 2019-07-23 – 2019-07-27 (×5): 6 via TOPICAL

## 2019-07-23 MED ORDER — VANCOMYCIN VARIABLE DOSE PER UNSTABLE RENAL FUNCTION (PHARMACIST DOSING): Status: DC

## 2019-07-23 MED ORDER — INSULIN ASPART 100 UNIT/ML ~~LOC~~ SOLN
7.0000 [IU] | Freq: Once | SUBCUTANEOUS | Status: AC
  Administered 2019-07-23: 22:00:00 7 [IU] via SUBCUTANEOUS

## 2019-07-23 MED ORDER — METRONIDAZOLE IN NACL 5-0.79 MG/ML-% IV SOLN
500.0000 mg | Freq: Three times a day (TID) | INTRAVENOUS | Status: DC
  Administered 2019-07-23 – 2019-07-24 (×3): 500 mg via INTRAVENOUS
  Filled 2019-07-23 (×4): qty 100

## 2019-07-23 MED ORDER — HEPARIN (PORCINE) 25000 UT/250ML-% IV SOLN: 750.0000 [IU]/h | INTRAVENOUS | Status: DC

## 2019-07-23 MED ORDER — NOREPINEPHRINE 4 MG/250ML-% IV SOLN
2.0000 ug/min | INTRAVENOUS | Status: DC
  Administered 2019-07-23: 2 ug/min via INTRAVENOUS
  Filled 2019-07-23: qty 250

## 2019-07-23 MED ORDER — SODIUM CHLORIDE 0.9 % IV SOLN
2.0000 g | INTRAVENOUS | Status: DC
  Administered 2019-07-23: 2 g via INTRAVENOUS
  Filled 2019-07-23: qty 2

## 2019-07-23 MED ORDER — DOPAMINE-DEXTROSE 3.2-5 MG/ML-% IV SOLN
0.0000 ug/kg/min | INTRAVENOUS | Status: DC
  Administered 2019-07-23: 10 ug/kg/min via INTRAVENOUS
  Filled 2019-07-23: qty 250

## 2019-07-23 MED ORDER — DEXTROSE 50 % IV SOLN
INTRAVENOUS | Status: AC
  Administered 2019-07-23: 50 mL via INTRAVENOUS
  Filled 2019-07-23: qty 50

## 2019-07-23 MED ORDER — SODIUM CHLORIDE 0.9 % IV BOLUS
500.0000 mL | Freq: Once | INTRAVENOUS | Status: AC
  Administered 2019-07-23: 05:00:00 500 mL via INTRAVENOUS

## 2019-07-23 MED ORDER — SODIUM CHLORIDE 0.9 % IV SOLN: INTRAVENOUS | Status: DC

## 2019-07-23 MED ORDER — SODIUM CHLORIDE 0.9 % IV BOLUS
500.0000 mL | Freq: Once | INTRAVENOUS | Status: AC
  Administered 2019-07-23: 500 mL via INTRAVENOUS

## 2019-07-23 MED ORDER — DEXTROSE 50 % IV SOLN: 25.0000 g | Freq: Once | INTRAVENOUS | Status: AC

## 2019-07-23 MED ORDER — DOPAMINE-DEXTROSE 3.2-5 MG/ML-% IV SOLN
2.5000 ug/kg/min | INTRAVENOUS | Status: DC
  Administered 2019-07-23: 2.5 ug/kg/min via INTRAVENOUS
  Filled 2019-07-23: qty 250

## 2019-07-23 MED ORDER — SODIUM CHLORIDE 0.9 % IV SOLN
250.0000 mL | INTRAVENOUS | Status: DC
  Administered 2019-07-23: 17:00:00 250 mL via INTRAVENOUS

## 2019-07-23 MED ORDER — NOREPINEPHRINE 4 MG/250ML-% IV SOLN
0.0000 ug/min | INTRAVENOUS | Status: DC
  Filled 2019-07-23: qty 250

## 2019-07-23 MED ORDER — NOREPINEPHRINE 16 MG/250ML-% IV SOLN
0.0000 ug/min | INTRAVENOUS | Status: DC
  Administered 2019-07-23: 2 ug/min via INTRAVENOUS
  Filled 2019-07-23: qty 250

## 2019-07-23 MED ORDER — HYDROCORTISONE NA SUCCINATE PF 100 MG IJ SOLR
50.0000 mg | Freq: Three times a day (TID) | INTRAMUSCULAR | Status: DC
  Administered 2019-07-23 – 2019-07-25 (×6): 50 mg via INTRAVENOUS
  Filled 2019-07-23 (×6): qty 2

## 2019-07-23 MED ORDER — VANCOMYCIN HCL IN DEXTROSE 1-5 GM/200ML-% IV SOLN
1000.0000 mg | Freq: Once | INTRAVENOUS | Status: AC
  Administered 2019-07-23: 1000 mg via INTRAVENOUS
  Filled 2019-07-23: qty 200

## 2019-07-23 NOTE — Progress Notes (Signed)
ANTICOAGULATION CONSULT NOTE - Follow Up Consult  Pharmacy Consult for heparin IV Indication: atrial fibrillation  Allergies  Allergen Reactions  . Flecainide Nausea Only and Other (See Comments)    Faint feeling  . Hydrocodone-Acetaminophen Nausea Only and Other (See Comments)    Severe headache  . Ibuprofen Other (See Comments)    Kidney dysfunction  . Oxycodone Hcl Nausea Only and Other (See Comments)    Headache  . Penicillins Nausea Only and Other (See Comments)    Severe headache Has patient had a PCN reaction causing immediate rash, facial/tongue/throat swelling, SOB or lightheadedness with hypotension: No Has patient had a PCN reaction causing severe rash involving mucus membranes or skin necrosis: No Has patient had a PCN reaction that required hospitalization: No Has patient had a PCN reaction occurring within the last 10 years: No If all of the above answers are "NO", then may proceed with Cephalosporin use.     Patient Measurements: Height: 4\' 10"  (147.3 cm) Weight: 130 lb 15.3 oz (59.4 kg) IBW/kg (Calculated) : 40.9 Heparin Dosing Weight: 53 kg  Vital Signs: Temp: 97.4 F (36.3 C) (01/27 1200) Temp Source: Axillary (01/27 1200) BP: 113/73 (01/27 1215) Pulse Rate: 71 (01/27 1221)  Labs: Recent Labs    07/21/19 0228 07/21/19 0228 07/22/19 0246 07/22/19 0246 07/22/19 1725 07/23/19 0303  HGB 16.1*   < > 15.8*   < > 14.9 14.9  HCT 51.0*   < > 49.7*  --  46.3* 46.1*  PLT 160   < > 154  --  134* PLATELET CLUMPS NOTED ON SMEAR, COUNT APPEARS ADEQUATE  CREATININE 1.25*  --  1.47*  --   --  1.88*   < > = values in this interval not displayed.    Estimated Creatinine Clearance: 23.5 mL/min (A) (by C-G formula based on SCr of 1.88 mg/dL (H)).   Medications:  Scheduled:  . vitamin C  500 mg Oral Daily  . Chlorhexidine Gluconate Cloth  6 each Topical Daily  . fluticasone furoate-vilanterol  2 puff Inhalation Daily  . hydrocortisone sod succinate  (SOLU-CORTEF) inj  50 mg Intravenous Q8H  . insulin aspart  0-15 Units Subcutaneous TID WC  . insulin aspart  0-5 Units Subcutaneous QHS  . levothyroxine  100 mcg Oral QAC breakfast  . pantoprazole (PROTONIX) IV  40 mg Intravenous Q12H  . pravastatin  10 mg Oral Daily  . zinc sulfate  220 mg Oral Daily   Infusions:  . sodium chloride    . metronidazole    . norepinephrine (LEVOPHED) Adult infusion      Assessment: 3 yoF admitted on 1/6 with COVID-19 pneumonia and transferred to ICU on 1/27.  Due to worsening renal function, pharmacy is consulted to transition PTA Xarelto for Afib to Heparin IV. Most recent Xarelto dose 15 mg PO given on 1/27 at 10:27  CBC: Hgb 14.9 remains stable.  Plt have been decreasing, Plt clumps on CBC today (BL Plt 267 on admit). LFTs elevated, Tbili increased to 1.3 D-dimer inc to 2.21 No bleeding or complications SCr increased to 1.88  Goal of Therapy:  Heparin level 0.3-0.7 units/ml aPTT 66-102 seconds Monitor platelets by anticoagulation protocol: Yes   Plan:  Baseline coags with AM labs No heparin bolus d/t Xarelto and AKI Start heparin IV infusion at 750 units/hr at 10:00 on 1/28 APTT 8 hours after starting Daily aPTT, heparin level, and CBC    Gretta Arab PharmD, BCPS Clinical pharmacist phone 7am- 5pm: [phone] 07/23/2019 3:08  PM   

## 2019-07-23 NOTE — Progress Notes (Signed)
  Echocardiogram 2D Echocardiogram has been performed.  Jennette Dubin 07/23/2019, 10:07 AM

## 2019-07-23 NOTE — Progress Notes (Addendum)
82: MD contacted: made aware of BP and unable to titrate dopamine while she's on PCU, Pt lethargic, falls asleep mid sentence, voice slurred/muffled, follows commands, denies pain. Dopamine at 2.5 mcg/kg/min. SpO2 88% on 8 L HFNC. FS 137, ok to hold AM insulin.  0845: MD at bedside. Pt slightly less lethargic, but SpO2 84% on 10 L HFNC and tachypneic. Placed on NRB with sats back up to 94%. Dopamine titrated up per MD. MD spoke with Brother, plan to send Pt to ICU.

## 2019-07-23 NOTE — Progress Notes (Signed)
PROGRESS NOTE                                                                                                                                                                                                             Patient Demographics:    [name], is a 64 y.o. female, DOB - 08-20-1955, [id number]  Outpatient Primary MD for the patient is Celene Squibb, MD   Admit date - 07/22/2019   LOS - 21  Chief Complaint  Patient presents with  . Shortness of Breath  . covid pos       Brief Narrative:  Patient is a 64 y.o. 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    Post admission-she had significant worsening of her hypoxemia-requiring 100% NRB-she was then transferred to [facility].  Hypoxia gradually improved with steroids/remdesivir and empiric antibiotics-however on 1/12-1/13-hypoxia briefly worsened-chest x-ray showed a new left-sided infiltrate.  Hospital course was also complicated by development of AKI and hypernatremia.  Patient was provided supportive care with IV fluids, and was restarted on IV antibiotics (Unasyn)-with further improvement of hypoxemia.  See below for further details.    COVID-19 Medications:  Steroids: 1/6>> 1/15 Remdesivir: 1/6>>1/10 Convalescent Plasma: 1/7   Antibiotics: Unasyn: 1/14>> Cefepime: 1/6>>1/12 Vancomycin: 1/6>>1/8   Subjective:   Patient in bed, appears comfortable, denies any headache, no fever, no chest pain or pressure, no shortness of breath , no abdominal pain. No  focal weakness.  Has some cyanosis in both feet for the last several days but no pain, mild tingling only.   Assessment  & Plan :   Acute Hypoxic Resp Failure due to Covid 19 Viral pneumonia  - with  concurrent bacterial pneumonia with underlying amiodarone induced lung injury. -Patient had fluctuating oxygen requirement during hospital stay , this morning she has increased oxygen requirement, she is 15 L high flow nasal cannula . -Treated initially for bacterial pneumonia Vanco and cefepime >> Unasyn/Augmentin . -Chest x-ray this morning showing worsening COVID-19 for pneumonia . -Speech  therapy is following the patient continue dysphagia 3 diet.  Continue using I-S and flutter valve for pulmonary toiletry.    O2 requirements:  SpO2: 91 % O2 Flow Rate (L/min): (S) 15 L/min   COVID-19 Labs: Recent Labs    07/21/19 0228 07/22/19 0246 07/23/19 0303  DDIMER 1.23* 0.77* 2.21*  CRP 2.0* 1.6* 4.2*       Component Value Date/Time   BNP 275.4 (H) 07/19/2019 0115    Recent Labs  Lab 07/20/19 0303 07/21/19 0228 07/22/19 0246 07/23/19 0303  PROCALCITON <0.10 0.15 0.19 26.79    Lab Results  Component Value Date   SARSCOV2NAA Detected (A) 06/26/2019   [place] NEGATIVE 02/20/2019   Ali Chukson Not Detected 12/18/2018   Septic shock -This morning patient is hypotensive, has worsening leukocytosis, elevated procalcitonin, septic work-up, including UA, blood cultures, x-ray showing worsening pneumonia, so we will start on broad-spectrum antibiotic vancomycin/cefepime/Flagyl. -On stress dose steroids. -Transfer to ICU, given need of pressor, will change dopamine to Levophed, plan for PICC  to be inserted, to monitor CVP.  AKI on CKD stage IIIa: Serum creatinine today, hold Lasix  Acute metabolic encephalopathy: This seems to have resolved.  HTN: Given soft blood pressure will discontinue metoprolol  Persistent atrial fibrillation s/p AV node ablation and PPM insertion:    -Hold metoprolol given hypotension, will change Xarelto to heparin GTT given worsening renal function . -has history of amiodarone induced lung toxicity  Chronic combined systolic and diastolic heart failure percent on echocardiogram in 02/2019:  -Repeat 2D echo this morning showing improved EF to 50%.  Bronchial asthma: Appears stable-no rhonchi-continue bronchodilators.  DM-2 (A1c 6.8): Hypoglycemic episode due to tapering of steroids, have cut down sliding scale and discontinued Lantus.  Will monitor.  CBG (last 3)  Recent Labs    07/23/19 0800 07/23/19 0945 07/23/19 1208  GLUCAP 137* 158* 217*   Hypothyroidism: Continue levothyroxine  History of amiodarone induced lung toxicity/ILD: Supportive care.  Remote history of ASD repair - no acute issues.  Deconditioning/debility: Continue PT OT and speech therapy, patient and family are planning home with home health.  Hyperkalemia.  Kayexalate and monitor.  Cyanosis of both feet.  Likely due to mild hypoperfusion, digits are nonpainful, advised to staff to have the patient wear multiple socks and have warm blankets around her feet.  She has good sensation and movement.  Positive pulses.    Consults  :  PCCM  Procedures  :  None  Condition -remains in ICU  Family Communication  : Cussed with brother via phone 1/27  Code Status :  DNR  Diet :  Diet Order            DIET DYS 3 Room service appropriate? Yes; Fluid consistency: Thin  Diet effective now               Disposition Plan  : Health PT once oxygenation improves further.    Antimicorbials  :    Anti-infectives (From admission, onward)   Start     Dose/Rate Route Frequency Ordered Stop   07/23/19 1500  metroNIDAZOLE (FLAGYL) IVPB 500 mg     500 mg 100 mL/hr over 60 Minutes Intravenous Every 8 hours 07/23/19 1458     07/15/19 1000  amoxicillin-clavulanate (AUGMENTIN) 500-125 MG per tablet 500 mg    Note to Pharmacy: Pharmacy can adjust for aspiration  pneumonia, stop date 07/17/2019   1 tablet Oral 2 times daily 07/15/19 0754 07/17/19 2100   07/10/19 1000  ampicillin-sulbactam (  UNASYN) 1.5 g in sodium chloride 0.9 % 100 mL IVPB  Status:  Discontinued     1.5 g 200 mL/hr over 30 Minutes Intravenous Every 8 hours 07/10/19 0945 07/15/19 0754   07/04/19 1830  vancomycin (VANCOREADY) IVPB 750 mg/150 mL  Status:  Discontinued     750 mg 150 mL/hr over 60 Minutes Intravenous Every 48 hours 07/03/19 1249 07/04/19 1110   07/04/19 1800  vancomycin (VANCOCIN) IVPB 1000 mg/200 mL premix  Status:  Discontinued     1,000 mg 200 mL/hr over 60 Minutes Intravenous Every 48 hours 07/03/2019 1727 07/03/19 1249   07/03/19 1800  ceFEPIme (MAXIPIME) 1 g in sodium chloride 0.9 % 100 mL IVPB  Status:  Discontinued     1 g 200 mL/hr over 30 Minutes Intravenous Every 24 hours 07/04/2019 1725 07/03/19 1248   07/03/19 1800  ceFEPIme (MAXIPIME) 2 g in sodium chloride 0.9 % 100 mL IVPB     2 g 200 mL/hr over 30 Minutes Intravenous Every 24 hours 07/03/19 1247 07/08/19 1850   07/03/19 1000  remdesivir 100 mg in sodium chloride 0.9 % 100 mL IVPB     100 mg 200 mL/hr over 30 Minutes Intravenous Daily 07/18/2019 1723 07/06/19 0926   07/03/19 1000  remdesivir 100 mg in sodium chloride 0.9 % 100 mL IVPB  Status:  Discontinued     100 mg 200 mL/hr over 30 Minutes Intravenous Daily 07/10/2019 2355 07/03/19 0257   07/05/2019 2354  remdesivir 200 mg in sodium chloride 0.9% 250 mL IVPB  Status:  Discontinued     200 mg 580 mL/hr over 30 Minutes Intravenous Once 07/08/2019 2355 07/03/19 0257   07/24/2019 1800  remdesivir 200 mg in sodium chloride 0.9% 250 mL IVPB     200 mg 580 mL/hr over 30 Minutes Intravenous Once 07/15/2019 1723 07/16/2019 2306   07/24/2019 1730  vancomycin (VANCOCIN) IVPB 1000 mg/200 mL premix  Status:  Discontinued     1,000 mg 200 mL/hr over 60 Minutes Intravenous  Once 07/06/2019 1722 07/03/2019 1725   07/23/2019 1730  ceFEPIme (MAXIPIME) 2 g in sodium chloride 0.9 % 100 mL  IVPB     2 g 200 mL/hr over 30 Minutes Intravenous  Once 07/21/2019 1722 06/30/2019 1918   07/10/2019 1730  vancomycin (VANCOREADY) IVPB 1500 mg/300 mL     1,500 mg 150 mL/hr over 120 Minutes Intravenous  Once 07/23/2019 1725 07/04/2019 2036      DVT Prophylaxis  : Xarelto  Inpatient Medications  Scheduled Meds: . vitamin C  500 mg Oral Daily  . Chlorhexidine Gluconate Cloth  6 each Topical Daily  . fluticasone furoate-vilanterol  2 puff Inhalation Daily  . hydrocortisone sod succinate (SOLU-CORTEF) inj  50 mg Intravenous Q8H  . insulin aspart  0-15 Units Subcutaneous TID WC  . insulin aspart  0-5 Units Subcutaneous QHS  . levothyroxine  100 mcg Oral QAC breakfast  . pantoprazole (PROTONIX) IV  40 mg Intravenous Q12H  . pravastatin  10 mg Oral Daily  . zinc sulfate  220 mg Oral Daily   Continuous Infusions: . sodium chloride    . metronidazole    . norepinephrine (LEVOPHED) Adult infusion     PRN Meds:.acetaminophen, albuterol, calcium carbonate, chlorpheniramine-HYDROcodone, guaiFENesin-dextromethorphan, lip balm, Melatonin, ondansetron (ZOFRAN) IV, senna-docusate   See all Orders from today for further details   Phillips Climes M.D on 07/23/2019 at 3:05 PM  To page go to [email or web address] - use universal password  [facility] -  Office  [phone]    Objective:   Vitals:   07/23/19 1215 07/23/19 1220 07/23/19 1221 07/23/19 1228  BP: 113/73     Pulse: 74 71 71   Resp: 20 (!) 24 (!) 24   Temp:      TempSrc:      SpO2: 94% (!) 84% (!) 81% 91%  Weight:      Height:        Wt Readings from Last 3 Encounters:  07/01/2019 59.9 kg  04/01/19 65.8 kg  04/01/19 64.4 kg     Intake/Output Summary (Last 24 hours) at 07/23/2019 1505 Last data filed at 07/23/2019 1200 Gross per 24 hour  Intake 784.98 ml  Output 0 ml  Net 784.98 ml     Physical Exam  Awake, alert, sick appearing  Symmetrical chest wall movement bilaterally, good air entry, scattered rails  Regular  rate and rhythm, no rubs murmurs gallops  Abdominal soft, nontender, nondistended, bowel sounds present  Both feet warm, nontender, pulse present .   Data Review:    CBC Recent Labs  Lab 07/19/19 0115 07/19/19 0115 07/20/19 0303 07/21/19 0228 07/22/19 0246 07/22/19 1725 07/23/19 0303  WBC 16.3*   < > 15.8* 17.8* 16.8* 20.4* 20.2*  HGB 14.9   < > 15.6* 16.1* 15.8* 14.9 14.9  HCT 47.2*   < > 48.9* 51.0* 49.7* 46.3* 46.1*  PLT 159   < > 145* 160 154 134* PLATELET CLUMPS NOTED ON SMEAR, COUNT APPEARS ADEQUATE  MCV 99.0   < > 99.2 98.5 98.2 99.4 96.8  MCH 31.2   < > 31.6 31.1 31.2 32.0 31.3  MCHC 31.6   < > 31.9 31.6 31.8 32.2 32.3  RDW 14.4   < > 14.1 14.1 14.0 14.1 14.0  LYMPHSABS 1.2  --  1.0 1.1 1.0  --  0.4*  MONOABS 1.0  --  1.1* 0.9 0.8  --  0.1  EOSABS 0.2  --  0.2 0.2 0.2  --  0.0  BASOSABS 0.1  --  0.0 0.1 0.1  --  0.0   < > = values in this interval not displayed.    Chemistries  Recent Labs  Lab 07/19/19 0115 07/20/19 0303 07/21/19 0228 07/22/19 0246 07/23/19 0303  NA 139 139 137 134* 138  K 4.1 4.6 4.7 6.0* 3.5  CL 94* 93* 92* 91* 91*  CO2 33* 32 31 31 27   GLUCOSE 97 22* 36* 34* <20*  BUN 71* 58* 65* 76* 77*  CREATININE 1.22* 1.21* 1.25* 1.47* 1.88*  CALCIUM 9.2 9.2 9.3 8.9 8.5*  MG 2.2 2.1 2.1 2.2 1.7  AST 38 36 25 36 54*  ALT 60* 61* 51* 50* 72*  ALKPHOS 214* 216* 204* 187* 212*  BILITOT 0.9 1.0 1.0 1.0 1.3*   ------------------------------------------------------------------------------------------------------------------ No results for input(s): CHOL, HDL, LDLCALC, TRIG, CHOLHDL, LDLDIRECT in the last 72 hours.  Lab Results  Component Value Date   HGBA1C 6.8 (H) 07/06/2019   ------------------------------------------------------------------------------------------------------------------ No results for input(s): TSH, T4TOTAL, T3FREE, THYROIDAB in the last 72 hours.  Invalid input(s):  FREET3 ------------------------------------------------------------------------------------------------------------------ No results for input(s): VITAMINB12, FOLATE, FERRITIN, TIBC, IRON, RETICCTPCT in the last 72 hours.  Coagulation profile No results for input(s): INR, PROTIME in the last 168 hours.  Recent Labs    07/22/19 0246 07/23/19 0303  DDIMER 0.77* 2.21*    Cardiac Enzymes No results for input(s): CKMB, TROPONINI, MYOGLOBIN in the last 168 hours.  Invalid input(s): CK ------------------------------------------------------------------------------------------------------------------    Component Value Date/Time  BNP 275.4 (H) 07/19/2019 0115    Micro Results Recent Results (from the past 240 hour(s))  MRSA PCR Screening     Status: None   Collection Time: 07/23/19 10:23 AM   Specimen: Nasopharyngeal  Result Value Ref Range Status   MRSA by PCR NEGATIVE NEGATIVE Final    Comment:        The GeneXpert MRSA Assay (FDA approved for NASAL specimens only), is one component of a comprehensive MRSA colonization surveillance program. It is not intended to diagnose MRSA infection nor to guide or monitor treatment for MRSA infections. Performed at [facility], [place] [address], [place], [place] [address]     Radiology Reports DG CHEST PORT 1 VIEW  Result Date: 07/23/2019 CLINICAL DATA:  Shortness of breath EXAM: PORTABLE CHEST 1 VIEW COMPARISON:  Radiograph 07/16/2019, CT 06/12/2017 FINDINGS: Pacer pack overlies the left chest wall with leads at the cardiac apex and coronary sinus. Telemetry leads overlie the chest. Nasal cannula projects over the upper chest. Surgical clips are seen at the base of the neck. Interval improvement in the diffuse bilateral opacities throughout both lungs with more focal consolidation in the left lung base. No visible pneumothorax. No effusion. No acute osseous or soft tissue abnormality. Degenerative changes are present  in the imaged spine and shoulders. Severe curvature of the thoracolumbar spine with thoracic levocurvature and lumbar dextrocurvature. IMPRESSION: Interval improvement in the diffuse bilateral opacities likely reflecting COVID-19 pneumonia. Electronically Signed   By: Lovena Le M.D.   On: 07/23/2019 06:53   DG Chest Port 1 View  Result Date: 07/16/2019 CLINICAL DATA:  Shortness of breath. EXAM: PORTABLE CHEST 1 VIEW COMPARISON:  07/09/2019 and CT chest 06/12/2017. FINDINGS: Trachea is midline. Heart is enlarged, stable. Pacemaker lead tips are stable in position. Worsening diffuse bilateral airspace opacification with left lower lobe consolidation. No definite pleural fluid. Thyroidectomy clips. Probable bone infarct or enchondroma in the proximal right humerus. IMPRESSION: Worsening bilateral airspace opacification, consistent with worsening COVID-19 pneumonia. Electronically Signed   By: Lorin Picket M.D.   On: 07/16/2019 09:04   DG CHEST PORT 1 VIEW  Result Date: 07/09/2019 CLINICAL DATA:  Shortness of breath EXAM: PORTABLE CHEST 1 VIEW COMPARISON:  07/06/2019 FINDINGS: Patchy bilateral airspace disease again noted, worsening since prior study, particularly in the left lower lobe. Cardiomegaly. Pacer remains in place, unchanged. No effusions or pneumothorax. No acute bony abnormality. IMPRESSION: Patchy bilateral airspace opacities, worsening since prior study, particularly in the left lower lobe. Electronically Signed   By: Rolm Baptise M.D.   On: 07/09/2019 21:47   DG Chest Port 1 View  Result Date: 07/03/2019 CLINICAL DATA:  COVID-19 positive.  Worsening shortness of breath. EXAM: PORTABLE CHEST 1 VIEW COMPARISON:  07/18/2019. FINDINGS: Surgical clips noted over the neck. AICD in stable position. Stable cardiomegaly. Diffuse severe bilateral pulmonary infiltrates again noted. Slight improvement in aeration on today's exam. No pleural effusion pneumothorax. Old infarct proximal right humerus.  No acute bony abnormality. IMPRESSION: 1.  AICD in stable position.  Stable cardiomegaly. 2. Diffuse severe bilateral pulmonary infiltrates again noted. Slight improvement in aeration on today's exam. Electronically Signed   By: Marcello Moores  Register   On: 07/03/2019 07:15   DG Chest Port 1 View  Result Date: 07/03/2019 CLINICAL DATA:  Hypoxia. EXAM: PORTABLE CHEST 1 VIEW COMPARISON:  None. FINDINGS: There is a dual lead AICD. Moderate severity diffuse bilateral infiltrates are seen. There is a small right pleural effusion. No pneumothorax is identified. The  cardiac silhouette is markedly enlarged. Multiple radiopaque surgical clips are seen overlying the superior mediastinum. There is marked severity levoscoliosis of the thoracic spine with moderate severity multilevel degenerative changes. IMPRESSION: 1. Moderate severity diffuse bilateral infiltrates with a small right pleural effusion. 2. Marked enlargement of the cardiac silhouette. 3. Dual lead AICD in place. Electronically Signed   By: Virgina Norfolk M.D.   On: 06/29/2019 15:50   DG Chest Port 1V today  Result Date: 07/06/2019 CLINICAL DATA:  Dyspnea, COVID-19 positive EXAM: PORTABLE CHEST 1 VIEW COMPARISON:  07/03/2019 chest radiograph. FINDINGS: Stable configuration of 2 lead left subclavian pacemaker. Surgical clips overlie the central lower neck. Stable cardiomediastinal silhouette with mild cardiomegaly. No pneumothorax. No pleural effusion. Patchy hazy opacities throughout the mid to lower lungs bilaterally, similar to minimally improved. IMPRESSION: Patchy hazy opacities throughout the mid to lower lungs bilaterally, similar to minimally improved, compatible with COVID-19 pneumonia. Stable cardiomegaly. Electronically Signed   By: Ilona Sorrel M.D.   On: 07/06/2019 13:09   ECHOCARDIOGRAM COMPLETE  Result Date: 07/23/2019   ECHOCARDIOGRAM REPORT   Patient Name:   [name]        Date of Exam: 07/23/2019 Medical Rec #:  [id number]               Height:       58.0 in Accession #:    [id number]             Weight:       132.1 lb Date of Birth:  [birth year]             BSA:          1.53 m Patient Age:    64 years               BP:           96/52 mmHg Patient Gender: F                      HR:           72 bpm. Exam Location:  Inpatient [facility] Procedure: 2D Echo Indications:    Hypotension  History:        Patient has prior history of Echocardiogram examinations, most                 recent 03/25/2019. CHF, Pacemaker, Covis-19 Positive,                 Arrythmias:Atrial Fibrillation; Risk Factors:Diabetes,                 Hypertension and Dyslipidemia.  Sonographer:    Mikki Santee RDCS (AE) Referring Phys: [id number] Cordie Beazley S Camillia Marcy IMPRESSIONS  1. Visually appears to have mild-moderate LVH, most prominent in septum. Overall normal LVEF on this study, improved from prior, with LV apical hypokinesis and prominent apical wall thickness.  2. Left ventricular ejection fraction, by visual estimation, is 55 to 60%. The left ventricle has normal function. There is moderately increased left ventricular hypertrophy.  3. Moderate hypokinesis of the left ventricular, apical apical segment.  4. Left ventricular diastolic parameters are indeterminate.  5. The left ventricle demonstrates regional wall motion abnormalities.  6. Global right ventricle has low normal systolic function.The right ventricular size is normal. No increase in right ventricular wall thickness.  7. Left atrial size was severely dilated.  8. Right atrial size was normal.  9. Small pericardial effusion. 10. Mild mitral annular calcification.  11. The mitral valve is normal in structure. Mild mitral valve regurgitation. 12. The tricuspid valve is normal in structure. 13. The tricuspid valve is normal in structure. Tricuspid valve regurgitation is trivial. 14. The aortic valve is tricuspid. Aortic valve regurgitation is trivial. 15. The pulmonic valve was grossly normal. Pulmonic valve  regurgitation is trivial. 16. A pacer wire is visualized in the RA and RV. FINDINGS  Left Ventricle: Left ventricular ejection fraction, by visual estimation, is 55 to 60%. The left ventricle has normal function. Moderate hypokinesis of the left ventricular, apical apical segment. The left ventricle demonstrates regional wall motion abnormalities. There is moderately increased left ventricular hypertrophy. Asymmetric left ventricular hypertrophy of the basal-septal wall. Left ventricular diastolic parameters are indeterminate. Right Ventricle: The right ventricular size is normal. No increase in right ventricular wall thickness. Global RV systolic function is has low normal systolic function. Left Atrium: Left atrial size was severely dilated. Right Atrium: Right atrial size was normal in size Pericardium: A small pericardial effusion is present. Mitral Valve: The mitral valve is normal in structure. Mild mitral annular calcification. Mild mitral valve regurgitation. Tricuspid Valve: The tricuspid valve is normal in structure. Tricuspid valve regurgitation is trivial. Aortic Valve: The aortic valve is tricuspid. Aortic valve regurgitation is trivial. Pulmonic Valve: The pulmonic valve was grossly normal. Pulmonic valve regurgitation is trivial. Pulmonic regurgitation is trivial. Aorta: The aortic root, ascending aorta and aortic arch are all structurally normal, with no evidence of dilitation or obstruction. Pulmonary Artery: The pulmonary artery is not well seen. IAS/Shunts: No atrial level shunt detected by color flow Doppler. Additional Comments: A pacer wire is visualized in the right atrium and right ventricle.  LEFT VENTRICLE PLAX 2D LVIDd:         4.21 cm  Diastology LVIDs:         2.91 cm  LV e' lateral: 5.22 cm/s LV PW:         1.10 cm  LV e' medial:  5.77 cm/s LV IVS:        1.72 cm LVOT diam:     1.90 cm LV SV:         47 ml LV SV Index:   29.34 LVOT Area:     2.84 cm  RIGHT VENTRICLE TAPSE (M-mode): 1.7  cm LEFT ATRIUM              Index       RIGHT ATRIUM           Index LA diam:        3.90 cm  2.55 cm/m  RA Area:     15.40 cm LA Vol (A2C):   99.6 ml  65.24 ml/m RA Volume:   35.70 ml  23.39 ml/m LA Vol (A4C):   96.8 ml  63.41 ml/m LA Biplane Vol: 106.0 ml 69.44 ml/m  AORTIC VALVE LVOT Vmax:   190.00 cm/s LVOT Vmean:  118.000 cm/s LVOT VTI:    0.356 m  AORTA Ao Root diam: 3.00 cm TRICUSPID VALVE TR Peak grad:   20.2 mmHg TR Vmax:        225.00 cm/s  SHUNTS Systemic VTI:  0.36 m Systemic Diam: 1.90 cm  Buford Dresser MD Electronically signed by Buford Dresser MD Signature Date/Time: 07/23/2019/1:37:02 PM    Final    [place] EKG SITE RITE  Result Date: 07/23/2019 If Site Rite image not attached, placement could not be confirmed due to current cardiac rhythm.

## 2019-07-23 NOTE — Progress Notes (Signed)
Rapid Response Event Note  Overview:    Called to bedside du/t requirements of dopamine and ams.  Initial Focused Assessment: Patient alert and oriented X2. Bp 89/50, O2 15L NRb and 15L HFNC. Interventions: Transfer to ICU for further care       Harley Alto

## 2019-07-23 NOTE — Progress Notes (Signed)
Inpatient Diabetes Program Recommendations  AACE/ADA: New Consensus Statement on Inpatient Glycemic Control (2015)  Target Ranges:  Prepandial:   less than 140 mg/dL      Peak postprandial:   less than 180 mg/dL (1-2 hours)      Critically ill patients:  140 - 180 mg/dL   Lab Results  Component Value Date   GLUCAP 217 (H) 07/23/2019   HGBA1C 6.8 (H) 07/06/2019    Review of Glycemic Control Results for [name], [name] (MRN [id number]) as of 07/23/2019 14:43  Ref. Range 07/23/2019 05:32 07/23/2019 06:01 07/23/2019 08:00 07/23/2019 09:45 07/23/2019 12:08  Glucose-Capillary Latest Ref Range: 70 - 99 mg/dL 34 (LL) 162 (H) 137 (H) 158 (H) 217 (H)   Diabetes history: DM 2 Outpatient Diabetes medications: Januvia 50 mg q AM  Current orders for Inpatient glycemic control:  Novolog moderate tid with meals and HS Inpatient Diabetes Program Recommendations:    Low blood sugar this AM. Note transfer to ICU.  Consider changing Novolog correction to "very sensitive" starting at 151 mg/dL tid with meals and HS. Also consider adding Novolog meal coverage 3 units tid with meals (HOLD IF PATIENT DOES NOT EAT OR EATS<50%).  Will need to avoid hypoglycemia.     Thanks,  Adah Perl, RN, BC-ADM Inpatient Diabetes Coordinator Pager [phone] (8a-5p)

## 2019-07-23 NOTE — Procedures (Signed)
Central Venous Catheter Insertion Procedure Note [name] [id number] 01/15/1956  Procedure: Insertion of Central Venous Catheter Indications: Assessment of intravascular volume  Procedure Details Consent: Risks of procedure as well as the alternatives and risks of each were explained to the (patient/caregiver).  Consent for procedure obtained. Time Out: Verified patient identification, verified procedure, site/side was marked, verified correct patient position, special equipment/implants available, medications/allergies/relevent history reviewed, required imaging and test results available.  Performed  Maximum sterile technique was used including antiseptics, cap, gloves, gown, hand hygiene, mask and sheet. Skin prep: Chlorhexidine; local anesthetic administered A antimicrobial bonded/coated triple lumen catheter was placed in the right internal jugular vein using the Seldinger technique.  Evaluation Blood flow good Complications: No apparent complications Patient did tolerate procedure well. Chest X-ray ordered to verify placement.  CXR: pending.    Line was done under u/s guidance. Easily compressible vein noted with neighboring pulsatile artery. Upon stick dark red non pulsatile blood was noted. Wire easily advanced. Wire was verified to be in easily compressible/non pulsatile vessel in both cross sectional and longitudinal views under ultrasound guidance. Skin was easily dilated and catheter advanced without issue. Wire was withdrawn from vessel. No air was aspirated thru entirety of the procedure. Pt tolerated well. No complications were appreciated.  Audria Nine 07/23/2019, 5:52 PM

## 2019-07-23 NOTE — Progress Notes (Addendum)
Pt BP 77/49 MAP 58, HR 71, Temp 97.5, RR 28, O2 sat 84% on 5L. Pt bumped up to 15L HFNC to recover. MD notified of pt's vital signs, lethargy and mentation. Pt is A&Ox2. Lung sounds diminished. Orders received for 500 ml NS bolus, Chest X-ray, and ABG's.    0450: Pt received 500 ml NS IV bolus. BP 89/57 map 67, RR 26, O2 sat 90% on 10 L HFNC.   0520: MD at bedside, BP 84/53, O2 sat 90% at 10L HFNC, orders received to give 500 ml NS IV bolus infusion over 2 hours. Will continue to monitor.  [id number]: Pt blood sugar 34, MD notified, orders received for hypoglycemic protocol, 1 amp of D50 given.   0629: Pt's blood pressure continues to decline, MD notified by ICU nurse Mickle Plumb, orders received for Dopamine drip. Will continue to monitor.

## 2019-07-23 NOTE — Progress Notes (Signed)
Requested to assess pt d/t AMS increased o2 and hypotension. Pt minimally responsive/ pale/ diaphoretic. Labs reviewed appears to have hypoglycemic episodes early morning x 3 days <40. CBG obtained- 34, given 25g d50 per protocol. Repeat cbg 150s pt appears more awake. Still hypotensive s/p 500cc IVF bolus. Dr. Vanita Ingles notified, ordered for low dose dopamine. Lg bore IV placed in LAC, infusion began & pt placed on tele. BP 91/51. CN notified of dopamine gtt ok with PCU as long as gtt is non-titratable. Pts nurse notified of changes.

## 2019-07-23 NOTE — Progress Notes (Signed)
Pharmacy Antibiotic Note  [name] is a 64 y.o. female admitted on 07/22/2019 with COVID-19 pneumonia.  She has previously completed courses of antibiotics (Cefepime/Vanc, Unasyn > Augmentin) for suspected bacterial pneumonia.  She is being restarted today d/t sepsis, hypotension, leukocytosis, increasing oxygen requirement.  Pharmacy has been consulted for Cefepime and Vancomycin dosing.  Plan: Cefepime 2g IV q24h Vancomycin 1000mg  IV x1 dose, further dosing based on renal function/levels. Goal AUC = 400 - 550  Follow up renal function, culture results, and clinical course.   Height: 4\' 10"  (147.3 cm) Weight: 132 lb 0.9 oz (59.9 kg) IBW/kg (Calculated) : 40.9  Temp (24hrs), Avg:97.6 F (36.4 C), Min:96.7 F (35.9 C), Max:98.6 F (37 C)  Recent Labs  Lab 07/19/19 0115 07/19/19 0115 07/20/19 0303 07/21/19 0228 07/22/19 0246 07/22/19 1725 07/23/19 0303  WBC 16.3*   < > 15.8* 17.8* 16.8* 20.4* 20.2*  CREATININE 1.22*  --  1.21* 1.25* 1.47*  --  1.88*   < > = values in this interval not displayed.    Estimated Creatinine Clearance: 23.5 mL/min (A) (by C-G formula based on SCr of 1.88 mg/dL (H)).    Allergies  Allergen Reactions  . Flecainide Nausea Only and Other (See Comments)    Faint feeling  . Hydrocodone-Acetaminophen Nausea Only and Other (See Comments)    Severe headache  . Ibuprofen Other (See Comments)    Kidney dysfunction  . Oxycodone Hcl Nausea Only and Other (See Comments)    Headache  . Penicillins Nausea Only and Other (See Comments)    Severe headache Has patient had a PCN reaction causing immediate rash, facial/tongue/throat swelling, SOB or lightheadedness with hypotension: No Has patient had a PCN reaction causing severe rash involving mucus membranes or skin necrosis: No Has patient had a PCN reaction that required hospitalization: No Has patient had a PCN reaction occurring within the last 10 years: No If all of the above answers are "NO", then  may proceed with Cephalosporin use.     Antimicrobials this admission: 1/6 Remdesivir >> 1/10 1/6 Vancomycin >> 1/8, resumed 1/27 >>  1/6 Cefepime >> 1/12, resumed 1/27 >>  1/14 Unasyn >> 1/19 1/19 Augmentin >> 1/21  Dose adjustments this admission:  Microbiology results: 12/31 PTA COVID - positive 1/6 BCx - negative 1/27 MRSA PCR: neg 1/27 BCx:   Thank you for allowing pharmacy to be a part of this patient's care.  Gretta Arab PharmD, BCPS Clinical pharmacist phone 7am- 5pm: [phone] 07/23/2019 3:16 PM

## 2019-07-23 NOTE — Progress Notes (Signed)
RT called to access patient by ICU Charge RN.  Patient had desated and was placed on NRB with the HFNC by RN prior to RT arrival at bedside.  Patient's sats 99%, no increased WOB but blood pressure is low.  Patient will be transferred to ICU.

## 2019-07-23 NOTE — Progress Notes (Signed)
PT Cancellation Note  Patient Details Name: [name] MRN: [id number] DOB: February 03, 1956   Cancelled Treatment:    Reason Eval/Treat Not Completed: Medical issues which prohibited therapy Pt being transferred to ICU, will attempt to f/u at a later time as pt becomes more appropriate to participate in therapy again.  Horald Chestnut, PT   Delford Field 07/23/2019, 10:12 AM

## 2019-07-24 LAB — CBC WITH DIFFERENTIAL/PLATELET
Abs Immature Granulocytes: 0.24 10*3/uL — ABNORMAL HIGH (ref 0.00–0.07)
Basophils Absolute: 0 10*3/uL (ref 0.0–0.1)
Basophils Relative: 0 %
Eosinophils Absolute: 0 10*3/uL (ref 0.0–0.5)
Eosinophils Relative: 0 %
HCT: 36 % (ref 36.0–46.0)
Hemoglobin: 11.5 g/dL — ABNORMAL LOW (ref 12.0–15.0)
Immature Granulocytes: 1 %
Lymphocytes Relative: 2 %
Lymphs Abs: 0.3 10*3/uL — ABNORMAL LOW (ref 0.7–4.0)
MCH: 31.2 pg (ref 26.0–34.0)
MCHC: 31.9 g/dL (ref 30.0–36.0)
MCV: 97.6 fL (ref 80.0–100.0)
Monocytes Absolute: 0.9 10*3/uL (ref 0.1–1.0)
Monocytes Relative: 4 %
Neutro Abs: 19.9 10*3/uL — ABNORMAL HIGH (ref 1.7–7.7)
Neutrophils Relative %: 93 %
Platelets: 121 10*3/uL — ABNORMAL LOW (ref 150–400)
RBC: 3.69 MIL/uL — ABNORMAL LOW (ref 3.87–5.11)
RDW: 14 % (ref 11.5–15.5)
WBC: 21.4 10*3/uL — ABNORMAL HIGH (ref 4.0–10.5)
nRBC: 0 % (ref 0.0–0.2)

## 2019-07-24 LAB — URINALYSIS, ROUTINE W REFLEX MICROSCOPIC
Bilirubin Urine: NEGATIVE
Glucose, UA: >=500 mg/dL — AB
Hgb urine dipstick: NEGATIVE
Ketones, ur: NEGATIVE mg/dL
Nitrite: NEGATIVE
Protein, ur: NEGATIVE mg/dL
Specific Gravity, Urine: 1.014 (ref 1.005–1.030)
WBC, UA: >50 WBC/hpf — ABNORMAL HIGH (ref 0–5)
pH: 5 (ref 5.0–8.0)

## 2019-07-24 LAB — GLUCOSE, CAPILLARY
Glucose-Capillary: 198 mg/dL — ABNORMAL HIGH (ref 70–99)
Glucose-Capillary: 360 mg/dL — ABNORMAL HIGH (ref 70–99)
Glucose-Capillary: 375 mg/dL — ABNORMAL HIGH (ref 70–99)
Glucose-Capillary: 320 mg/dL — ABNORMAL HIGH (ref 70–99)

## 2019-07-24 LAB — COMPREHENSIVE METABOLIC PANEL
ALT: 43 U/L (ref 0–44)
AST: 23 U/L (ref 15–41)
Albumin: 2.1 g/dL — ABNORMAL LOW (ref 3.5–5.0)
Alkaline Phosphatase: 154 U/L — ABNORMAL HIGH (ref 38–126)
Anion gap: 10 (ref 5–15)
BUN: 59 mg/dL — ABNORMAL HIGH (ref 8–23)
CO2: 27 mmol/L (ref 22–32)
Calcium: 7.9 mg/dL — ABNORMAL LOW (ref 8.9–10.3)
Chloride: 102 mmol/L (ref 98–111)
Creatinine, Ser: 1.27 mg/dL — ABNORMAL HIGH (ref 0.44–1.00)
GFR calc Af Amer: 52 mL/min — ABNORMAL LOW (ref >60)
GFR calc non Af Amer: 45 mL/min — ABNORMAL LOW (ref >60)
Glucose, Bld: 244 mg/dL — ABNORMAL HIGH (ref 70–99)
Potassium: 3.2 mmol/L — ABNORMAL LOW (ref 3.5–5.1)
Sodium: 139 mmol/L (ref 135–145)
Total Bilirubin: 0.9 mg/dL (ref 0.3–1.2)
Total Protein: 5.6 g/dL — ABNORMAL LOW (ref 6.5–8.1)

## 2019-07-24 LAB — MAGNESIUM: Magnesium: 2 mg/dL (ref 1.7–2.4)

## 2019-07-24 LAB — HEPARIN LEVEL (UNFRACTIONATED): Heparin Unfractionated: 1.12 IU/mL — ABNORMAL HIGH (ref 0.30–0.70)

## 2019-07-24 LAB — D-DIMER, QUANTITATIVE: D-Dimer, Quant: 0.63 ug/mL-FEU — ABNORMAL HIGH (ref 0.00–0.50)

## 2019-07-24 LAB — PROCALCITONIN: Procalcitonin: 36.12 ng/mL

## 2019-07-24 LAB — C-REACTIVE PROTEIN: CRP: 12.2 mg/dL — ABNORMAL HIGH (ref <1.0)

## 2019-07-24 LAB — APTT: aPTT: 38 seconds — ABNORMAL HIGH (ref 24–36)

## 2019-07-24 MED ORDER — DIPHENHYDRAMINE HCL 50 MG/ML IJ SOLN: 50.0000 mg | Freq: Once | INTRAMUSCULAR | Status: DC | PRN

## 2019-07-24 MED ORDER — FAMOTIDINE IN NACL 20-0.9 MG/50ML-% IV SOLN: 20.0000 mg | Freq: Once | INTRAVENOUS | Status: DC | PRN

## 2019-07-24 MED ORDER — ALBUTEROL SULFATE HFA 108 (90 BASE) MCG/ACT IN AERS: 2.0000 | INHALATION_SPRAY | Freq: Once | RESPIRATORY_TRACT | Status: DC | PRN

## 2019-07-24 MED ORDER — POTASSIUM CHLORIDE CRYS ER 20 MEQ PO TBCR
40.0000 meq | EXTENDED_RELEASE_TABLET | Freq: Once | ORAL | Status: AC
  Administered 2019-07-24: 40 meq via ORAL
  Filled 2019-07-24: qty 2

## 2019-07-24 MED ORDER — INSULIN ASPART 100 UNIT/ML ~~LOC~~ SOLN
3.0000 [IU] | Freq: Three times a day (TID) | SUBCUTANEOUS | Status: DC
  Administered 2019-07-24 – 2019-07-28 (×6): 3 [IU] via SUBCUTANEOUS

## 2019-07-24 MED ORDER — METHYLPREDNISOLONE SODIUM SUCC 125 MG IJ SOLR: 125.0000 mg | Freq: Once | INTRAMUSCULAR | Status: DC | PRN

## 2019-07-24 MED ORDER — SODIUM CHLORIDE 0.9 % IV SOLN: INTRAVENOUS | Status: DC | PRN

## 2019-07-24 MED ORDER — EPINEPHRINE 0.3 MG/0.3ML IJ SOAJ: 0.3000 mg | Freq: Once | INTRAMUSCULAR | Status: DC | PRN

## 2019-07-24 MED ORDER — SODIUM CHLORIDE 0.9 % IV SOLN
2.0000 g | Freq: Two times a day (BID) | INTRAVENOUS | Status: DC
  Administered 2019-07-24 – 2019-07-28 (×9): 2 g via INTRAVENOUS
  Filled 2019-07-24 (×9): qty 2

## 2019-07-24 MED ORDER — INSULIN ASPART 100 UNIT/ML ~~LOC~~ SOLN
0.0000 [IU] | Freq: Three times a day (TID) | SUBCUTANEOUS | Status: DC
  Administered 2019-07-24 (×2): 9 [IU] via SUBCUTANEOUS
  Administered 2019-07-25 – 2019-07-27 (×3): 1 [IU] via SUBCUTANEOUS
  Administered 2019-07-28 (×2): 2 [IU] via SUBCUTANEOUS

## 2019-07-24 MED ORDER — RIVAROXABAN 15 MG PO TABS
15.0000 mg | ORAL_TABLET | Freq: Every day | ORAL | Status: DC
  Administered 2019-07-24: 15 mg via ORAL
  Filled 2019-07-24 (×3): qty 1

## 2019-07-24 MED ORDER — SODIUM CHLORIDE 0.9 % IV SOLN: 700.0000 mg | Freq: Once | INTRAVENOUS | Status: DC

## 2019-07-24 NOTE — Progress Notes (Signed)
Pharmacy Antibiotic Note  [name] is a 64 y.o. female admitted on 07/21/2019 with COVID-19 pneumonia.  She has previously completed courses of antibiotics (Cefepime/Vanc, Unasyn > Augmentin) for suspected bacterial pneumonia.  She is being restarted d/t sepsis, hypotension, leukocytosis, increasing oxygen requirement.  Pharmacy has been consulted for Cefepime and Vancomycin dosing. SCr improved to 1.27, WBC increased to 21, Afebrile.  Per Dr. Waldron Labs, suspect UTI and d/c vancomycin and metronidazole.  Plan: Increase to Cefepime 2g IV q12h D/C vancomycin and metronidazole. Follow up renal function, culture results, and clinical course.   Height: 4\' 10"  (147.3 cm) Weight: 130 lb 15.3 oz (59.4 kg) IBW/kg (Calculated) : 40.9  Temp (24hrs), Avg:97.4 F (36.3 C), Min:96.7 F (35.9 C), Max:97.7 F (36.5 C)  Recent Labs  Lab 07/20/19 0303 07/20/19 0303 07/21/19 0228 07/22/19 0246 07/22/19 1725 07/23/19 0303 07/24/19 0456  WBC 15.8*   < > 17.8* 16.8* 20.4* 20.2* 21.4*  CREATININE 1.21*  --  1.25* 1.47*  --  1.88* 1.27*   < > = values in this interval not displayed.    Estimated Creatinine Clearance: 34.6 mL/min (A) (by C-G formula based on SCr of 1.27 mg/dL (H)).    Allergies  Allergen Reactions  . Flecainide Nausea Only and Other (See Comments)    Faint feeling  . Hydrocodone-Acetaminophen Nausea Only and Other (See Comments)    Severe headache  . Ibuprofen Other (See Comments)    Kidney dysfunction  . Oxycodone Hcl Nausea Only and Other (See Comments)    Headache  . Penicillins Nausea Only and Other (See Comments)    Severe headache Has patient had a PCN reaction causing immediate rash, facial/tongue/throat swelling, SOB or lightheadedness with hypotension: No Has patient had a PCN reaction causing severe rash involving mucus membranes or skin necrosis: No Has patient had a PCN reaction that required hospitalization: No Has patient had a PCN reaction occurring  within the last 10 years: No If all of the above answers are "NO", then may proceed with Cephalosporin use.     Antimicrobials this admission: 1/6 Remdesivir >> 1/10 1/6 Vancomycin >> 1/8, resumed 1/27 >> 1/28 1/6 Cefepime >> 1/12, resumed 1/27 >>   1/14 Unasyn >> 1/19 1/19 Augmentin >> 1/21 1/27 metronidazole >> 1/28  Dose adjustments this admission:  Microbiology results: 12/31 PTA COVID - positive 1/6 BCx - negative 1/27 MRSA PCR: neg 1/27 BCx: ngtd 1/28 UCx:   Thank you for allowing pharmacy to be a part of this patient's care.  Gretta Arab PharmD, BCPS Clinical pharmacist phone 7am- 5pm: [phone] 07/24/2019 7:43 AM

## 2019-07-24 NOTE — Progress Notes (Signed)
Physical Therapy Treatment Patient Details Name: [name] MRN: [id number] DOB: 1956/06/21 Today's Date: 07/24/2019    History of Present Illness 64 y.o. female, PMHx including amiodarone toxicity, interstitial lung disease, chronic systolic/diastolic heart failure, GERD,  history of congenital heart disease, DM II, iron deficient anemia, hypertension,hyperparathyroidism, peripheral vascular disease, renal insufficiency, persistent A. fib anticoagulated on Xarelto presented to ED w/ shortness of breath, patient was recently diagnosed 12/31 with COVID-19, at baseline has no oxygen requirement, patient reported her symptoms started on 1/3, with progressive dyspnea, fever, chills, cough, neurolyse body ache, denies nausea, vomiting, diarrhea, chest pain, leg edema, or orthopnea. in ED was febrile 103, hypoxic in the mid 80s, requiring initially 6 L nasal cannula, chest x-ray significant for diffuse bilateral opacities, pt admited for COVID-19 for pneumonia.    PT Comments    Pt was able to tolerate OOB to the recliner chair and in bed bath.  NRB mask used for better tolerance of transfer. Pt on 12L HFNC and 15 NRB mask and desated to 85% (+) 3/4 DOE and belly breathing.  Pt was able to recover with mask donned in ~5 mins.  She was then able to switch to 12 L O2 HFNC in the recliner.  O2 measured by pedi earlobe probe.  PT will continue to follow acutely for safe mobility progression.  I have asked for CIR consult to see if she is appropriate for that level of rehab at discharge.    Follow Up Recommendations  CIR     Equipment Recommendations  Rolling walker with 5" wheels    Recommendations for Other Services   NA     Precautions / Restrictions Precautions Precautions: Fall;Other (comment) Precaution Comments: Monitor oxygen; desats with exertion currently using NRB in addition to HFNC for mobility.  HFNC only at rest    Mobility  Bed Mobility Overal bed mobility: Needs  Assistance Bed Mobility: Supine to Sit     Supine to sit: Min assist;HOB elevated     General bed mobility comments: Min assist with heavy use of rail to come to sitting EOB.  Pt needed support at trunk for balance, managing legs on her own.   Transfers Overall transfer level: Needs assistance Equipment used: 2 person hand held assist Transfers: Sit to/from [facility] Sit to Stand: +2 safety/equipment;Min assist Stand pivot transfers: +2 safety/equipment;Min assist       General transfer comment: Two person min assist to stand and pivot to recliner chair.  Helpful to have second person for lines.   Ambulation/Gait             General Gait Details: not able to tolerate gait yet          Balance Overall balance assessment: Needs assistance Sitting-balance support: Feet supported;Bilateral upper extremity supported Sitting balance-Leahy Scale: Poor Sitting balance - Comments: needs min assist for balance EOB. Postural control: Right lateral lean Standing balance support: Bilateral upper extremity supported Standing balance-Leahy Scale: Poor Standing balance comment: needs external support in standing.                             Cognition Arousal/Alertness: Awake/alert Behavior During Therapy: WFL for tasks assessed/performed                                   General Comments: Seems to have a baseline cognitive deficit.  General Comments General comments (skin integrity, edema, etc.): Pt on 12 L HFNC, with additional NRB 15L for mobility.  Desats to 85% with (+) DOE3/4 and belly breathing.  Recovers with mask in ~5 mins, then mask can be removed and pt ok on HFNC only.  O2 measured by pedi earlobe probe.       Pertinent Vitals/Pain Pain Assessment: Faces Faces Pain Scale: Hurts little more Pain Location: genital discomfort Pain Descriptors / Indicators: Grimacing;Guarding Pain Intervention(s): Limited  activity within patient's tolerance;Monitored during session;Repositioned(RN applying barrier cream and powder)           PT Goals (current goals can now be found in the care plan section) Acute Rehab PT Goals Patient Stated Goal: To go home Time For Goal Achievement: 08/07/19 Potential to Achieve Goals: Good Progress towards PT goals: Progressing toward goals    Frequency    Min 2X/week      PT Plan Current plan remains appropriate       AM-PAC PT "6 Clicks" Mobility   Outcome Measure  Help needed turning from your back to your side while in a flat bed without using bedrails?: A Little Help needed moving from lying on your back to sitting on the side of a flat bed without using bedrails?: A Little Help needed moving to and from a bed to a chair (including a wheelchair)?: A Little Help needed standing up from a chair using your arms (e.g., wheelchair or bedside chair)?: A Little Help needed to walk in hospital room?: A Lot Help needed climbing 3-5 steps with a railing? : Total 6 Click Score: 15    End of Session Equipment Utilized During Treatment: Oxygen Activity Tolerance: Patient limited by fatigue Patient left: in chair;with nursing/sitter in room   PT Visit Diagnosis: Other abnormalities of gait and mobility (R26.89);Muscle weakness (generalized) (M62.81)     Time: [id number] PT Time Calculation (min) (ACUTE ONLY): 23 min  Charges:  $Therapeutic Activity: 23-37 mins                    Verdene Lennert, PT, DPT  Acute Rehabilitation [phone] pager #[phone] office  @ [facility]: [phone]   07/24/2019, 4:08 PM

## 2019-07-24 NOTE — Plan of Care (Signed)

## 2019-07-24 NOTE — Progress Notes (Signed)
ANTICOAGULATION CONSULT NOTE - Follow Up Consult  Pharmacy Consult for Heparin IV Indication: atrial fibrillation  Allergies  Allergen Reactions  . Flecainide Nausea Only and Other (See Comments)    Faint feeling  . Hydrocodone-Acetaminophen Nausea Only and Other (See Comments)    Severe headache  . Ibuprofen Other (See Comments)    Kidney dysfunction  . Oxycodone Hcl Nausea Only and Other (See Comments)    Headache  . Penicillins Nausea Only and Other (See Comments)    Severe headache Has patient had a PCN reaction causing immediate rash, facial/tongue/throat swelling, SOB or lightheadedness with hypotension: No Has patient had a PCN reaction causing severe rash involving mucus membranes or skin necrosis: No Has patient had a PCN reaction that required hospitalization: No Has patient had a PCN reaction occurring within the last 10 years: No If all of the above answers are "NO", then may proceed with Cephalosporin use.     Patient Measurements: Height: 4\' 10"  (147.3 cm) Weight: 130 lb 15.3 oz (59.4 kg) IBW/kg (Calculated) : 40.9 Heparin Dosing Weight: 53 kg  Vital Signs: Temp: 97.7 F (36.5 C) (01/27 2000) Temp Source: Oral (01/27 2000) BP: 118/67 (01/28 0200) Pulse Rate: 76 (01/28 0200)  Labs: Recent Labs    07/22/19 0246 07/22/19 0246 07/22/19 1725 07/22/19 1725 07/23/19 0303 07/24/19 0456  HGB 15.8*   < > 14.9   < > 14.9 11.5*  HCT 49.7*   < > 46.3*  --  46.1* 36.0  PLT 154   < > 134*  --  PLATELET CLUMPS NOTED ON SMEAR, COUNT APPEARS ADEQUATE 121*  HEPARINUNFRC  --   --   --   --   --  1.12*  CREATININE 1.47*  --   --   --  1.88*  --    < > = values in this interval not displayed.    Estimated Creatinine Clearance: 23.4 mL/min (A) (by C-G formula based on SCr of 1.88 mg/dL (H)).   Medications:  Infusions:  . sodium chloride Stopped (07/23/19 1729)  . sodium chloride 75 mL/hr at 07/24/19 0654  . ceFEPime (MAXIPIME) IV Stopped (07/23/19 1634)  .  heparin    . metronidazole 500 mg (07/24/19 0649)  . norepinephrine (LEVOPHED) Adult infusion 5 mcg/min (07/24/19 0000)    Assessment: 46 yoF admitted on 1/6 with COVID-19 pneumonia and transferred to ICU on 1/27.  Due to worsening renal function, pharmacy is consulted to transition PTA Xarelto for Afib to Heparin IV. Most recent Xarelto dose 15 mg PO given on 1/27 at 10:27  Baseline HL 1.12 falsely elevated d/t recent Xarelto.  Will use aPTT for heparin dosing. Baseline APTT 38, wnl CBC: Hgb with large decrease to 11.5, Plt decreased to 121k (baseline Plt 267 on admit).  No bleeding or complications reported LFTs and Tbili improved to WNL. SCr improved to 1.27, CrCl ~ 35 ml/min.   Goal of Therapy:  Heparin level 0.3-0.7 units/ml aPTT 66-102 seconds Monitor platelets by anticoagulation protocol: Yes   Plan:  Since SCr is improved, Change heparin per pharmacy orders back to Xarelto. Xarelto 15 mg PO daily per MD. Pharmacy will f/u peripherally.    Gretta Arab PharmD, BCPS Clinical pharmacist phone 7am- 5pm: [phone] 07/24/2019 6:59 AM

## 2019-07-24 NOTE — Progress Notes (Signed)
PROGRESS NOTE                                                                                                                                                                                                             Patient Demographics:    [name], is a 64 y.o. female, DOB - 1955-09-11, [id number]  Outpatient Primary MD for the patient is Celene Squibb, MD   Admit date - 07/12/2019   LOS - 72  Chief Complaint  Patient presents with  . Shortness of Breath  . covid pos       Brief Narrative:  Patient is a 64 y.o. 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    Post admission-she had significant worsening of her hypoxemia-requiring 100% NRB-she was then transferred to [facility].  Hypoxia gradually improved with steroids/remdesivir and empiric antibiotics-however on 1/12-1/13-hypoxia briefly worsened-chest x-ray showed a new left-sided infiltrate.  Hospital course was also complicated by development of AKI and hypernatremia.  Patient was provided supportive care with IV fluids, and was restarted on IV antibiotics (Unasyn)-with further improvement of hypoxemia.   -1/27patient hypotensive, septic, transferred to ICU, requiring pressor support.  COVID-19 Medications:  Steroids: 1/6>> 1/15 Remdesivir: 1/6>>1/10 Convalescent Plasma: 1/7   Antibiotics: Unasyn: 1/14>> Cefepime: 1/6>>1/12 Vancomycin: 1/6>>1/8   Subjective:   Patient in bed, appears comfortable, denies any headache, no fever, no chest pain or  pressure, no shortness of breath , no abdominal pain did report some dyspnea today.   Assessment  & Plan :   Septic shock -Hypertensive, tachycardic, with elevated procalcitonin, most likely related to urinary source, given positive UA and dysuria -Continue Levophed, titrate for map of 65. -Keep CVP 12-15, continue with IV fluids. -Follow blood cultures and urine cultures -Continue with stress dose hydrocortisone -Broad-spectrum antibiotics, MRSA PCR negative, DC Vanco and Flagyl, continue with cefepime for UTI. -Continue to trend procalcitonin, especially if still trending up.   Acute Hypoxic Resp Failure due to Covid 19 Viral pneumonia  - with  concurrent  bacterial pneumonia with underlying amiodarone induced lung injury. -Patient had fluctuating oxygen requirement during hospital stay , this morning she has increased oxygen requirement, she is 15 L high flow nasal cannula . -Treated initially for bacterial pneumonia Vanco and cefepime >> Unasyn/Augmentin . -Chest x-ray this morning showing worsening COVID-19 for pneumonia . -Speech therapy is following the patient continue dysphagia 3 diet.  Continue using I-S and flutter valve for pulmonary toiletry.    O2 requirements:  SpO2: 92 % O2 Flow Rate (L/min): 12 L/min   COVID-19 Labs: Recent Labs    07/22/19 0246 07/23/19 0303 07/24/19 0456  DDIMER 0.77* 2.21* 0.63*  CRP 1.6* 4.2* 12.2*       Component Value Date/Time   BNP 275.4 (H) 07/19/2019 0115    Recent Labs  Lab 07/21/19 0228 07/22/19 0246 07/23/19 0303 07/24/19 0456  PROCALCITON 0.15 0.19 26.79 36.12    Lab Results  Component Value Date   SARSCOV2NAA Detected (A) 06/26/2019   [place] NEGATIVE 02/20/2019   [place] Not Detected 12/18/2018    AKI on CKD stage IIIa: Serum creatinine today, hold Lasix  Acute metabolic encephalopathy: This seems to have resolved.  HTN: Given soft blood pressure will discontinue metoprolol  Persistent atrial  fibrillation s/p AV node ablation and PPM insertion:  -Hold metoprolol given hypotension, will change Xarelto to heparin GTT given worsening renal function . -has history of amiodarone induced lung toxicity  Chronic combined systolic and diastolic heart failure percent on echocardiogram in 02/2019:  -Repeat 2D echo this morning showing improved EF to 50%.  Bronchial asthma: Appears stable-no rhonchi-continue bronchodilators.  DM-2 (A1c 6.8): Hypoglycemic episode due to tapering of steroids, now her CBG uncontrolled, she is back on sliding scale, and 3 units before meals  CBG (last 3)  Recent Labs    07/23/19 2147 07/24/19 0731 07/24/19 1140  GLUCAP 417* 198* 360*   Hypothyroidism: Continue levothyroxine  History of amiodarone induced lung toxicity/ILD: Supportive care.  Remote history of ASD repair - no acute issues.  Deconditioning/debility: Continue PT OT and speech therapy, patient and family are planning home with home health.  Hyperkalemia.  Kayexalate and monitor.  Cyanosis of both feet.  Likely due to mild hypoperfusion, digits are nonpainful, advised to staff to have the patient wear multiple socks and have warm blankets around her feet.  She has good sensation and movement.  Positive pulses.    Consults  :  PCCM  Procedures  :  None  Condition -remains in ICU  Family Communication  : Discussed with brother via phone   Code Status :  DNR  Diet :  Diet Order            DIET DYS 3 Room service appropriate? Yes; Fluid consistency: Thin  Diet effective now               Disposition Plan  : Health PT once oxygenation improves further.    Antimicorbials  :    Anti-infectives (From admission, onward)   Start     Dose/Rate Route Frequency Ordered Stop   07/24/19 1030  ceFEPIme (MAXIPIME) 2 g in sodium chloride 0.9 % 100 mL IVPB     2 g 200 mL/hr over 30 Minutes Intravenous Every 12 hours 07/24/19 1012     07/23/19 1600  vancomycin (VANCOCIN) IVPB 1000  mg/200 mL premix     1,000 mg 200 mL/hr over 60 Minutes Intravenous  Once 07/23/19 1517 07/23/19 1827   07/23/19 1534  vancomycin variable dose  per unstable renal function (pharmacist dosing)  Status:  Discontinued      Does not apply See admin instructions 07/23/19 1534 07/24/19 1032   07/23/19 1530  ceFEPIme (MAXIPIME) 2 g in sodium chloride 0.9 % 100 mL IVPB  Status:  Discontinued     2 g 200 mL/hr over 30 Minutes Intravenous Every 24 hours 07/23/19 1517 07/24/19 1012   07/23/19 1500  metroNIDAZOLE (FLAGYL) IVPB 500 mg  Status:  Discontinued     500 mg 100 mL/hr over 60 Minutes Intravenous Every 8 hours 07/23/19 1458 07/24/19 1032   07/15/19 1000  amoxicillin-clavulanate (AUGMENTIN) 500-125 MG per tablet 500 mg    Note to Pharmacy: Pharmacy can adjust for aspiration pneumonia, stop date 07/17/2019   1 tablet Oral 2 times daily 07/15/19 0754 07/17/19 2100   07/10/19 1000  ampicillin-sulbactam (UNASYN) 1.5 g in sodium chloride 0.9 % 100 mL IVPB  Status:  Discontinued     1.5 g 200 mL/hr over 30 Minutes Intravenous Every 8 hours 07/10/19 0945 07/15/19 0754   07/04/19 1830  vancomycin (VANCOREADY) IVPB 750 mg/150 mL  Status:  Discontinued     750 mg 150 mL/hr over 60 Minutes Intravenous Every 48 hours 07/03/19 1249 07/04/19 1110   07/04/19 1800  vancomycin (VANCOCIN) IVPB 1000 mg/200 mL premix  Status:  Discontinued     1,000 mg 200 mL/hr over 60 Minutes Intravenous Every 48 hours 07/01/2019 1727 07/03/19 1249   07/03/19 1800  ceFEPIme (MAXIPIME) 1 g in sodium chloride 0.9 % 100 mL IVPB  Status:  Discontinued     1 g 200 mL/hr over 30 Minutes Intravenous Every 24 hours 06/30/2019 1725 07/03/19 1248   07/03/19 1800  ceFEPIme (MAXIPIME) 2 g in sodium chloride 0.9 % 100 mL IVPB     2 g 200 mL/hr over 30 Minutes Intravenous Every 24 hours 07/03/19 1247 07/08/19 1850   07/03/19 1000  remdesivir 100 mg in sodium chloride 0.9 % 100 mL IVPB     100 mg 200 mL/hr over 30 Minutes Intravenous Daily  07/05/2019 1723 07/06/19 0926   07/03/19 1000  remdesivir 100 mg in sodium chloride 0.9 % 100 mL IVPB  Status:  Discontinued     100 mg 200 mL/hr over 30 Minutes Intravenous Daily 07/17/2019 2355 07/03/19 0257   07/04/2019 2354  remdesivir 200 mg in sodium chloride 0.9% 250 mL IVPB  Status:  Discontinued     200 mg 580 mL/hr over 30 Minutes Intravenous Once 07/15/2019 2355 07/03/19 0257   07/18/2019 1800  remdesivir 200 mg in sodium chloride 0.9% 250 mL IVPB     200 mg 580 mL/hr over 30 Minutes Intravenous Once 07/03/2019 1723 07/01/2019 2306   07/19/2019 1730  vancomycin (VANCOCIN) IVPB 1000 mg/200 mL premix  Status:  Discontinued     1,000 mg 200 mL/hr over 60 Minutes Intravenous  Once 07/07/2019 1722 06/27/2019 1725   07/03/2019 1730  ceFEPIme (MAXIPIME) 2 g in sodium chloride 0.9 % 100 mL IVPB     2 g 200 mL/hr over 30 Minutes Intravenous  Once 07/01/2019 1722 07/01/2019 1918   07/06/2019 1730  vancomycin (VANCOREADY) IVPB 1500 mg/300 mL     1,500 mg 150 mL/hr over 120 Minutes Intravenous  Once 07/15/2019 1725 07/07/2019 2036      DVT Prophylaxis  : Xarelto  Inpatient Medications  Scheduled Meds: . vitamin C  500 mg Oral Daily  . Chlorhexidine Gluconate Cloth  6 each Topical Daily  . fluticasone furoate-vilanterol  2  puff Inhalation Daily  . hydrocortisone sod succinate (SOLU-CORTEF) inj  50 mg Intravenous Q8H  . insulin aspart  0-5 Units Subcutaneous QHS  . insulin aspart  0-9 Units Subcutaneous TID WC  . insulin aspart  3 Units Subcutaneous TID WC  . levothyroxine  100 mcg Oral QAC breakfast  . pantoprazole (PROTONIX) IV  40 mg Intravenous Q12H  . pravastatin  10 mg Oral Daily  . rivaroxaban  15 mg Oral Daily  . zinc sulfate  220 mg Oral Daily   Continuous Infusions: . sodium chloride Stopped (07/23/19 1729)  . sodium chloride 75 mL/hr at 07/24/19 1400  . sodium chloride    . ceFEPime (MAXIPIME) IV Stopped (07/24/19 1208)  . norepinephrine (LEVOPHED) Adult infusion 1 mcg/min (07/24/19 1400)    PRN Meds:.sodium chloride, acetaminophen, albuterol, calcium carbonate, chlorpheniramine-HYDROcodone, guaiFENesin-dextromethorphan, lip balm, Melatonin, ondansetron (ZOFRAN) IV, senna-docusate   See all Orders from today for further details   Phillips Climes M.D on 07/24/2019 at 2:48 PM  To page go to [email or web address] - use universal password  [facility] -  Office  [phone]    Objective:   Vitals:   07/24/19 1330 07/24/19 1345 07/24/19 1400 07/24/19 1415  BP: 117/81 124/75 131/72 127/78  Pulse: 72 72 73 73  Resp: 20 19 (!) 21 (!) 22  Temp:      TempSrc:      SpO2: 92% 92% 94% 92%  Weight:      Height:        Wt Readings from Last 3 Encounters:  07/23/19 59.4 kg  04/01/19 65.8 kg  04/01/19 64.4 kg     Intake/Output Summary (Last 24 hours) at 07/24/2019 1448 Last data filed at 07/24/2019 1400 Gross per 24 hour  Intake 3222.1 ml  Output 400 ml  Net 2822.1 ml     Physical Exam  Awake Alert, Oriented X 3, appropriate and conversant today ,no new F.N deficits, Normal affect Symmetrical Chest wall movement, Good air movement bilaterally, CTAB RRR,No Gallops,Rubs or new Murmurs, No Parasternal Heave +ve B.Sounds, Abd Soft, No tenderness, No rebound - guarding or rigidity. No Cyanosis, Clubbing or edema, No new Rash or bruise      Data Review:    CBC Recent Labs  Lab 07/20/19 0303 07/20/19 0303 07/21/19 0228 07/22/19 0246 07/22/19 1725 07/23/19 0303 07/24/19 0456  WBC 15.8*   < > 17.8* 16.8* 20.4* 20.2* 21.4*  HGB 15.6*   < > 16.1* 15.8* 14.9 14.9 11.5*  HCT 48.9*   < > 51.0* 49.7* 46.3* 46.1* 36.0  PLT 145*   < > 160 154 134* PLATELET CLUMPS NOTED ON SMEAR, COUNT APPEARS ADEQUATE 121*  MCV 99.2   < > 98.5 98.2 99.4 96.8 97.6  MCH 31.6   < > 31.1 31.2 32.0 31.3 31.2  MCHC 31.9   < > 31.6 31.8 32.2 32.3 31.9  RDW 14.1   < > 14.1 14.0 14.1 14.0 14.0  LYMPHSABS 1.0  --  1.1 1.0  --  0.4* 0.3*  MONOABS 1.1*  --  0.9 0.8  --  0.1 0.9  EOSABS 0.2   --  0.2 0.2  --  0.0 0.0  BASOSABS 0.0  --  0.1 0.1  --  0.0 0.0   < > = values in this interval not displayed.    Chemistries  Recent Labs  Lab 07/20/19 0303 07/21/19 0228 07/22/19 0246 07/23/19 0303 07/24/19 0456  NA 139 137 134* 138 139  K 4.6 4.7 6.0* 3.5 3.2*  CL 93* 92* 91* 91* 102  CO2 32 31 31 27 27   GLUCOSE 22* 36* 34* <20* 244*  BUN 58* 65* 76* 77* 59*  CREATININE 1.21* 1.25* 1.47* 1.88* 1.27*  CALCIUM 9.2 9.3 8.9 8.5* 7.9*  MG 2.1 2.1 2.2 1.7 2.0  AST 36 25 36 54* 23  ALT 61* 51* 50* 72* 43  ALKPHOS 216* 204* 187* 212* 154*  BILITOT 1.0 1.0 1.0 1.3* 0.9   ------------------------------------------------------------------------------------------------------------------ No results for input(s): CHOL, HDL, LDLCALC, TRIG, CHOLHDL, LDLDIRECT in the last 72 hours.  Lab Results  Component Value Date   HGBA1C 6.8 (H) 07/06/2019   ------------------------------------------------------------------------------------------------------------------ No results for input(s): TSH, T4TOTAL, T3FREE, THYROIDAB in the last 72 hours.  Invalid input(s): FREET3 ------------------------------------------------------------------------------------------------------------------ No results for input(s): VITAMINB12, FOLATE, FERRITIN, TIBC, IRON, RETICCTPCT in the last 72 hours.  Coagulation profile No results for input(s): INR, PROTIME in the last 168 hours.  Recent Labs    07/23/19 0303 07/24/19 0456  DDIMER 2.21* 0.63*    Cardiac Enzymes No results for input(s): CKMB, TROPONINI, MYOGLOBIN in the last 168 hours.  Invalid input(s): CK ------------------------------------------------------------------------------------------------------------------    Component Value Date/Time   BNP 275.4 (H) 07/19/2019 0115    Micro Results Recent Results (from the past 240 hour(s))  MRSA PCR Screening     Status: None   Collection Time: 07/23/19 10:23 AM   Specimen: Nasopharyngeal   Result Value Ref Range Status   MRSA by PCR NEGATIVE NEGATIVE Final    Comment:        The GeneXpert MRSA Assay (FDA approved for NASAL specimens only), is one component of a comprehensive MRSA colonization surveillance program. It is not intended to diagnose MRSA infection nor to guide or monitor treatment for MRSA infections. Performed at [facility], [place] [address] [place], [place] [address]   Culture, blood (routine x 2)     Status: None (Preliminary result)   Collection Time: 07/23/19 10:03 PM   Specimen: BLOOD LEFT HAND  Result Value Ref Range Status   Specimen Description   Final    BLOOD LEFT HAND Performed at [facility] [address], [place], [place] [address]    Special Requests   Final    BOTTLES DRAWN AEROBIC ONLY Blood Culture results may not be optimal due to an inadequate volume of blood received in culture bottles Performed at [facility] [address], [place], [place] [address]    Culture   Final    NO GROWTH < 12 HOURS Performed at [facility] [address] [place], [place] [address]    Report Status PENDING  Incomplete  Culture, blood (routine x 2)     Status: None (Preliminary result)   Collection Time: 07/23/19 10:08 PM   Specimen: BLOOD RIGHT HAND  Result Value Ref Range Status   Specimen Description   Final    BLOOD RIGHT HAND Performed at [facility] [address], [place], [place] [address]    Special Requests   Final    BOTTLES DRAWN AEROBIC ONLY Blood Culture results may not be optimal due to an inadequate volume of blood received in culture bottles Performed at [facility] [address], [place], [place] [address]    Culture   Final    NO GROWTH < 12 HOURS Performed at [facility] [address] [place], [place] [address]    Report Status PENDING  Incomplete  Radiology Reports DG CHEST PORT 1  VIEW  Result Date: 07/23/2019 CLINICAL DATA:  Central line placement. EXAM: PORTABLE CHEST 1 VIEW COMPARISON:  Chest x-ray from same day at 0653 hours. FINDINGS: New right internal jugular central venous catheter with tip in the distal SVC. Unchanged left chest wall pacemaker. Stable cardiomegaly. Patchy airspace disease throughout both lungs has mildly worsened. No pleural effusion or pneumothorax. No acute osseous abnormality. IMPRESSION: 1. New right internal jugular central venous catheter without complicating feature. 2. Worsened multifocal pneumonia. Electronically Signed   By: Titus Dubin M.D.   On: 07/23/2019 17:39   DG CHEST PORT 1 VIEW  Result Date: 07/23/2019 CLINICAL DATA:  Shortness of breath EXAM: PORTABLE CHEST 1 VIEW COMPARISON:  Radiograph 07/16/2019, CT 06/12/2017 FINDINGS: Pacer pack overlies the left chest wall with leads at the cardiac apex and coronary sinus. Telemetry leads overlie the chest. Nasal cannula projects over the upper chest. Surgical clips are seen at the base of the neck. Interval improvement in the diffuse bilateral opacities throughout both lungs with more focal consolidation in the left lung base. No visible pneumothorax. No effusion. No acute osseous or soft tissue abnormality. Degenerative changes are present in the imaged spine and shoulders. Severe curvature of the thoracolumbar spine with thoracic levocurvature and lumbar dextrocurvature. IMPRESSION: Interval improvement in the diffuse bilateral opacities likely reflecting COVID-19 pneumonia. Electronically Signed   By: Lovena Le M.D.   On: 07/23/2019 06:53   DG Chest Port 1 View  Result Date: 07/16/2019 CLINICAL DATA:  Shortness of breath. EXAM: PORTABLE CHEST 1 VIEW COMPARISON:  07/09/2019 and CT chest 06/12/2017. FINDINGS: Trachea is midline. Heart is enlarged, stable. Pacemaker lead tips are stable in position. Worsening diffuse bilateral airspace opacification with left lower lobe consolidation. No  definite pleural fluid. Thyroidectomy clips. Probable bone infarct or enchondroma in the proximal right humerus. IMPRESSION: Worsening bilateral airspace opacification, consistent with worsening COVID-19 pneumonia. Electronically Signed   By: Lorin Picket M.D.   On: 07/16/2019 09:04   DG CHEST PORT 1 VIEW  Result Date: 07/09/2019 CLINICAL DATA:  Shortness of breath EXAM: PORTABLE CHEST 1 VIEW COMPARISON:  07/06/2019 FINDINGS: Patchy bilateral airspace disease again noted, worsening since prior study, particularly in the left lower lobe. Cardiomegaly. Pacer remains in place, unchanged. No effusions or pneumothorax. No acute bony abnormality. IMPRESSION: Patchy bilateral airspace opacities, worsening since prior study, particularly in the left lower lobe. Electronically Signed   By: Rolm Baptise M.D.   On: 07/09/2019 21:47   DG Chest Port 1 View  Result Date: 07/03/2019 CLINICAL DATA:  COVID-19 positive.  Worsening shortness of breath. EXAM: PORTABLE CHEST 1 VIEW COMPARISON:  07/17/2019. FINDINGS: Surgical clips noted over the neck. AICD in stable position. Stable cardiomegaly. Diffuse severe bilateral pulmonary infiltrates again noted. Slight improvement in aeration on today's exam. No pleural effusion pneumothorax. Old infarct proximal right humerus. No acute bony abnormality. IMPRESSION: 1.  AICD in stable position.  Stable cardiomegaly. 2. Diffuse severe bilateral pulmonary infiltrates again noted. Slight improvement in aeration on today's exam. Electronically Signed   By: Marcello Moores  Register   On: 07/03/2019 07:15   DG Chest Port 1 View  Result Date: 07/12/2019 CLINICAL DATA:  Hypoxia. EXAM: PORTABLE CHEST 1 VIEW COMPARISON:  None. FINDINGS: There is a dual lead AICD. Moderate severity diffuse bilateral infiltrates are seen. There is a small right pleural effusion. No pneumothorax is identified. The cardiac silhouette is markedly enlarged. Multiple radiopaque surgical clips are seen overlying the  superior mediastinum.  There is marked severity levoscoliosis of the thoracic spine with moderate severity multilevel degenerative changes. IMPRESSION: 1. Moderate severity diffuse bilateral infiltrates with a small right pleural effusion. 2. Marked enlargement of the cardiac silhouette. 3. Dual lead AICD in place. Electronically Signed   By: Virgina Norfolk M.D.   On: 07/27/2019 15:50   DG Chest Port 1V today  Result Date: 07/06/2019 CLINICAL DATA:  Dyspnea, COVID-19 positive EXAM: PORTABLE CHEST 1 VIEW COMPARISON:  07/03/2019 chest radiograph. FINDINGS: Stable configuration of 2 lead left subclavian pacemaker. Surgical clips overlie the central lower neck. Stable cardiomediastinal silhouette with mild cardiomegaly. No pneumothorax. No pleural effusion. Patchy hazy opacities throughout the mid to lower lungs bilaterally, similar to minimally improved. IMPRESSION: Patchy hazy opacities throughout the mid to lower lungs bilaterally, similar to minimally improved, compatible with COVID-19 pneumonia. Stable cardiomegaly. Electronically Signed   By: Ilona Sorrel M.D.   On: 07/06/2019 13:09   ECHOCARDIOGRAM COMPLETE  Result Date: 07/23/2019   ECHOCARDIOGRAM REPORT   Patient Name:   KIMANI PING        Date of Exam: 07/23/2019 Medical Rec #:  [id number]              Height:       58.0 in Accession #:    [id number]             Weight:       132.1 lb Date of Birth:  [birth year]             BSA:          1.53 m Patient Age:    66 years               BP:           96/52 mmHg Patient Gender: F                      HR:           72 bpm. Exam Location:  Inpatient [facility] Procedure: 2D Echo Indications:    Hypotension  History:        Patient has prior history of Echocardiogram examinations, most                 recent 03/25/2019. CHF, Pacemaker, Covis-19 Positive,                 Arrythmias:Atrial Fibrillation; Risk Factors:Diabetes,                 Hypertension and Dyslipidemia.  Sonographer:    Mikki Santee  RDCS (AE) Referring Phys: [id number] Roy Snuffer S Carey Lafon IMPRESSIONS  1. Visually appears to have mild-moderate LVH, most prominent in septum. Overall normal LVEF on this study, improved from prior, with LV apical hypokinesis and prominent apical wall thickness.  2. Left ventricular ejection fraction, by visual estimation, is 55 to 60%. The left ventricle has normal function. There is moderately increased left ventricular hypertrophy.  3. Moderate hypokinesis of the left ventricular, apical apical segment.  4. Left ventricular diastolic parameters are indeterminate.  5. The left ventricle demonstrates regional wall motion abnormalities.  6. Global right ventricle has low normal systolic function.The right ventricular size is normal. No increase in right ventricular wall thickness.  7. Left atrial size was severely dilated.  8. Right atrial size was normal.  9. Small pericardial effusion. 10. Mild mitral annular calcification. 11. The mitral valve is normal in structure. Mild mitral valve regurgitation. 12. The tricuspid  valve is normal in structure. 13. The tricuspid valve is normal in structure. Tricuspid valve regurgitation is trivial. 14. The aortic valve is tricuspid. Aortic valve regurgitation is trivial. 15. The pulmonic valve was grossly normal. Pulmonic valve regurgitation is trivial. 16. A pacer wire is visualized in the RA and RV. FINDINGS  Left Ventricle: Left ventricular ejection fraction, by visual estimation, is 55 to 60%. The left ventricle has normal function. Moderate hypokinesis of the left ventricular, apical apical segment. The left ventricle demonstrates regional wall motion abnormalities. There is moderately increased left ventricular hypertrophy. Asymmetric left ventricular hypertrophy of the basal-septal wall. Left ventricular diastolic parameters are indeterminate. Right Ventricle: The right ventricular size is normal. No increase in right ventricular wall thickness. Global RV systolic function is  has low normal systolic function. Left Atrium: Left atrial size was severely dilated. Right Atrium: Right atrial size was normal in size Pericardium: A small pericardial effusion is present. Mitral Valve: The mitral valve is normal in structure. Mild mitral annular calcification. Mild mitral valve regurgitation. Tricuspid Valve: The tricuspid valve is normal in structure. Tricuspid valve regurgitation is trivial. Aortic Valve: The aortic valve is tricuspid. Aortic valve regurgitation is trivial. Pulmonic Valve: The pulmonic valve was grossly normal. Pulmonic valve regurgitation is trivial. Pulmonic regurgitation is trivial. Aorta: The aortic root, ascending aorta and aortic arch are all structurally normal, with no evidence of dilitation or obstruction. Pulmonary Artery: The pulmonary artery is not well seen. IAS/Shunts: No atrial level shunt detected by color flow Doppler. Additional Comments: A pacer wire is visualized in the right atrium and right ventricle.  LEFT VENTRICLE PLAX 2D LVIDd:         4.21 cm  Diastology LVIDs:         2.91 cm  LV e' lateral: 5.22 cm/s LV PW:         1.10 cm  LV e' medial:  5.77 cm/s LV IVS:        1.72 cm LVOT diam:     1.90 cm LV SV:         47 ml LV SV Index:   29.34 LVOT Area:     2.84 cm  RIGHT VENTRICLE TAPSE (M-mode): 1.7 cm LEFT ATRIUM              Index       RIGHT ATRIUM           Index LA diam:        3.90 cm  2.55 cm/m  RA Area:     15.40 cm LA Vol (A2C):   99.6 ml  65.24 ml/m RA Volume:   35.70 ml  23.39 ml/m LA Vol (A4C):   96.8 ml  63.41 ml/m LA Biplane Vol: 106.0 ml 69.44 ml/m  AORTIC VALVE LVOT Vmax:   190.00 cm/s LVOT Vmean:  118.000 cm/s LVOT VTI:    0.356 m  AORTA Ao Root diam: 3.00 cm TRICUSPID VALVE TR Peak grad:   20.2 mmHg TR Vmax:        225.00 cm/s  SHUNTS Systemic VTI:  0.36 m Systemic Diam: 1.90 cm  Buford Dresser MD Electronically signed by Buford Dresser MD Signature Date/Time: 07/23/2019/1:37:02 PM    Final    [place] EKG SITE  RITE  Result Date: 07/23/2019 If Site Rite image not attached, placement could not be confirmed due to current cardiac rhythm.

## 2019-07-24 NOTE — Progress Notes (Signed)
Results for MIMI, SHUGARS (MRN [id number]) as of 07/24/2019 13:23  Ref. Range 07/23/2019 16:35 07/23/2019 21:47 07/24/2019 07:31 07/24/2019 11:40  Glucose-Capillary Latest Ref Range: 70 - 99 mg/dL 338 (H) 417 (H) 198 (H) 360 (H)  Noted that blood sugars have been very labile.   Recommend adding Novolog 3 units TID with meals if patient eats at least 50% of meal and blood sugars continue to be elevated. May need to increase Novolog correction scale if blood sugars continue to be greater than 200 mg/dl.   Harvel Ricks RN BSN CDE Diabetes Coordinator Pager: [phone]  8am-5pm

## 2019-07-24 NOTE — Progress Notes (Signed)
RN updated Mr. [name] on pt's POC.  Discussed pt's Cr level and appetite.  Pt's brother is pleased with the care being provided at this time.  RN will continue to assess/monitor pt

## 2019-07-24 NOTE — Progress Notes (Signed)
Updated Eddie Dibbles (brother) by phone.

## 2019-07-25 ENCOUNTER — Inpatient Hospital Stay (HOSPITAL_COMMUNITY): Payer: PPO

## 2019-07-25 LAB — PROCALCITONIN: Procalcitonin: 20.45 ng/mL

## 2019-07-25 LAB — BLOOD CULTURE ID PANEL (REFLEXED)

## 2019-07-25 LAB — CBC WITH DIFFERENTIAL/PLATELET
Abs Immature Granulocytes: 0.15 10*3/uL — ABNORMAL HIGH (ref 0.00–0.07)
Basophils Absolute: 0 10*3/uL (ref 0.0–0.1)
Basophils Relative: 0 %
Eosinophils Absolute: 0.1 10*3/uL (ref 0.0–0.5)
Eosinophils Relative: 1 %
HCT: 36.2 % (ref 36.0–46.0)
Hemoglobin: 11.5 g/dL — ABNORMAL LOW (ref 12.0–15.0)
Immature Granulocytes: 1 %
Lymphocytes Relative: 5 %
Lymphs Abs: 0.6 10*3/uL — ABNORMAL LOW (ref 0.7–4.0)
MCH: 31.6 pg (ref 26.0–34.0)
MCHC: 31.8 g/dL (ref 30.0–36.0)
MCV: 99.5 fL (ref 80.0–100.0)
Monocytes Absolute: 0.8 10*3/uL (ref 0.1–1.0)
Monocytes Relative: 7 %
Neutro Abs: 10 10*3/uL — ABNORMAL HIGH (ref 1.7–7.7)
Neutrophils Relative %: 86 %
Platelets: 89 10*3/uL — ABNORMAL LOW (ref 150–400)
RBC: 3.64 MIL/uL — ABNORMAL LOW (ref 3.87–5.11)
RDW: 14.2 % (ref 11.5–15.5)
WBC: 11.7 10*3/uL — ABNORMAL HIGH (ref 4.0–10.5)
nRBC: 0 % (ref 0.0–0.2)

## 2019-07-25 LAB — COMPREHENSIVE METABOLIC PANEL
ALT: 39 U/L (ref 0–44)
AST: 29 U/L (ref 15–41)
Albumin: 2.1 g/dL — ABNORMAL LOW (ref 3.5–5.0)
Alkaline Phosphatase: 147 U/L — ABNORMAL HIGH (ref 38–126)
Anion gap: 8 (ref 5–15)
BUN: 46 mg/dL — ABNORMAL HIGH (ref 8–23)
CO2: 24 mmol/L (ref 22–32)
Calcium: 8.3 mg/dL — ABNORMAL LOW (ref 8.9–10.3)
Chloride: 111 mmol/L (ref 98–111)
Creatinine, Ser: 0.97 mg/dL (ref 0.44–1.00)
GFR calc Af Amer: >60 mL/min (ref >60)
GFR calc non Af Amer: >60 mL/min (ref >60)
Glucose, Bld: 71 mg/dL (ref 70–99)
Potassium: 4.5 mmol/L (ref 3.5–5.1)
Sodium: 143 mmol/L (ref 135–145)
Total Bilirubin: 1 mg/dL (ref 0.3–1.2)
Total Protein: 5.5 g/dL — ABNORMAL LOW (ref 6.5–8.1)

## 2019-07-25 LAB — C-REACTIVE PROTEIN: CRP: 8.6 mg/dL — ABNORMAL HIGH (ref <1.0)

## 2019-07-25 LAB — GLUCOSE, CAPILLARY
Glucose-Capillary: 91 mg/dL (ref 70–99)
Glucose-Capillary: 115 mg/dL — ABNORMAL HIGH (ref 70–99)
Glucose-Capillary: 148 mg/dL — ABNORMAL HIGH (ref 70–99)
Glucose-Capillary: 182 mg/dL — ABNORMAL HIGH (ref 70–99)

## 2019-07-25 LAB — D-DIMER, QUANTITATIVE: D-Dimer, Quant: 0.72 ug/mL-FEU — ABNORMAL HIGH (ref 0.00–0.50)

## 2019-07-25 MED ORDER — VANCOMYCIN HCL IN DEXTROSE 1-5 GM/200ML-% IV SOLN
1000.0000 mg | Freq: Once | INTRAVENOUS | Status: AC
  Administered 2019-07-25: 17:00:00 1000 mg via INTRAVENOUS
  Filled 2019-07-25: qty 200

## 2019-07-25 MED ORDER — VANCOMYCIN HCL 750 MG/150ML IV SOLN
750.0000 mg | INTRAVENOUS | Status: DC
  Administered 2019-07-26: 750 mg via INTRAVENOUS
  Filled 2019-07-25 (×2): qty 150

## 2019-07-25 MED ORDER — MORPHINE SULFATE (PF) 2 MG/ML IV SOLN
1.0000 mg | INTRAVENOUS | Status: DC | PRN
  Administered 2019-07-25 – 2019-07-27 (×12): 1 mg via INTRAVENOUS
  Filled 2019-07-25 (×12): qty 1

## 2019-07-25 MED ORDER — HYDROCORTISONE NA SUCCINATE PF 100 MG IJ SOLR
50.0000 mg | Freq: Two times a day (BID) | INTRAMUSCULAR | Status: DC
  Administered 2019-07-25 – 2019-07-27 (×4): 50 mg via INTRAVENOUS
  Filled 2019-07-25 (×4): qty 2

## 2019-07-25 MED ORDER — FUROSEMIDE 10 MG/ML IJ SOLN
60.0000 mg | Freq: Once | INTRAMUSCULAR | Status: AC
  Administered 2019-07-25: 60 mg via INTRAVENOUS
  Filled 2019-07-25: qty 6

## 2019-07-25 MED ORDER — ORAL CARE MOUTH RINSE
15.0000 mL | Freq: Two times a day (BID) | OROMUCOSAL | Status: DC
  Administered 2019-07-27 – 2019-07-28 (×2): 15 mL via OROMUCOSAL

## 2019-07-25 MED ORDER — METRONIDAZOLE IN NACL 5-0.79 MG/ML-% IV SOLN
500.0000 mg | Freq: Three times a day (TID) | INTRAVENOUS | Status: DC
  Administered 2019-07-25 – 2019-07-28 (×9): 500 mg via INTRAVENOUS
  Filled 2019-07-25 (×12): qty 100

## 2019-07-25 MED ORDER — CHLORHEXIDINE GLUCONATE 0.12 % MT SOLN
15.0000 mL | Freq: Two times a day (BID) | OROMUCOSAL | Status: DC
  Administered 2019-07-25 – 2019-07-27 (×6): 15 mL via OROMUCOSAL
  Filled 2019-07-25 (×6): qty 15

## 2019-07-25 MED ORDER — ORAL CARE MOUTH RINSE: 15.0000 mL | Freq: Two times a day (BID) | OROMUCOSAL | Status: DC

## 2019-07-25 NOTE — Progress Notes (Signed)
Removed NRB while pt remained on 8L HFNC. Within about 1 min, sats dropped to 88-89 and pt said she was feeling short of breath. NRB placed back on pt along w 8L HFNC.

## 2019-07-25 NOTE — Progress Notes (Signed)
Call made to pt's brother, Eddie Dibbles to update on pt condition and plan of care. Questions answered.

## 2019-07-25 NOTE — Progress Notes (Signed)
Occupational Therapy Treatment Patient Details Name: [name] MRN: [id number] DOB: 1955/12/21 Today's Date: 07/25/2019    History of present illness 64 y.o. female, PMHx including amiodarone toxicity, interstitial lung disease, chronic systolic/diastolic heart failure, GERD,  history of congenital heart disease, DM II, iron deficient anemia, hypertension,hyperparathyroidism, peripheral vascular disease, renal insufficiency, persistent A. fib anticoagulated on Xarelto presented to ED w/ shortness of breath, patient was recently diagnosed 12/31 with COVID-19, at baseline has no oxygen requirement, patient reported her symptoms started on 1/3, with progressive dyspnea, fever, chills, cough, neurolyse body ache, denies nausea, vomiting, diarrhea, chest pain, leg edema, or orthopnea. in ED was febrile 103, hypoxic in the mid 80s, requiring initially 6 L nasal cannula, chest x-ray significant for diffuse bilateral opacities, pt admited for COVID-19 for pneumonia.   OT comments  Pt transferred to ICU 1/28. Pt on 15L HFNC and 15L NRB -seen for bed level exercises with level 1 theraband and squeeze ball. Completed BLE heel slides x 10. Difficulty completing sentences due to SOB. Pt enjoys listening to "Easy listening"music - left music on to help with anxiety.  Encouraged nursing to complete exercises with pt as she is able to tolerate. Will follow acutely.   Follow Up Recommendations  SNF;CIR    Equipment Recommendations  3 in 1 bedside commode    Recommendations for Other Services      Precautions / Restrictions Precautions Precautions: Fall;Other (comment) Precaution Comments: desats easily       Mobility Bed Mobility Overal bed mobility: Needs Assistance             General bed mobility comments: elevated HOB during session  Transfers                 General transfer comment: not attempted this session    Balance     Sitting balance-Leahy Scale: Fair                                      ADL either performed or assessed with clinical judgement   ADL Overall ADL's : Needs assistance/impaired Eating/Feeding: Set up;Sitting                                           Vision       Perception     Praxis      Cognition Arousal/Alertness: Lethargic Behavior During Therapy: Anxious Overall Cognitive Status: Impaired/Different from baseline Area of Impairment: Attention                   Current Attention Level: Sustained   Following Commands: Follows one step commands consistently Safety/Judgement: Decreased awareness of safety;Decreased awareness of deficits Awareness: Intellectual Problem Solving: Slow processing;Requires verbal cues General Comments: Seems to have a baseline cognitive deficit.        Exercises Exercises: General Lower Extremity;General Upper Extremity General Exercises - Upper Extremity Shoulder Flexion: AROM;AAROM;Both;5 reps;Seated Shoulder ABduction: AROM;AAROM;Strengthening;Both;5 reps;Seated Elbow Flexion: Both;10 reps;Theraband Theraband Level (Elbow Flexion): Level 1 (Yellow) Digit Composite Flexion: Squeeze ball;Both;10 reps General Exercises - Lower Extremity Heel Slides: AAROM;Strengthening;5 reps;Both Straight Leg Raises: AAROM;Both;Seated;10 reps Other Exercises Other Exercises: squeeze ball x 15 each hand   Shoulder Instructions       General Comments      Pertinent Vitals/ Pain  Pain Assessment: Faces Faces Pain Scale: Hurts a little bit Pain Location: general discomfort Pain Descriptors / Indicators: Discomfort Pain Intervention(s): Limited activity within patient's tolerance  Home Living                                          Prior Functioning/Environment              Frequency  Min 3X/week        Progress Toward Goals  OT Goals(current goals can now be found in the care plan section)  Progress towards OT  goals: Not progressing toward goals - comment  Acute Rehab OT Goals Patient Stated Goal: to get better Time For Goal Achievement: 08/05/19 Potential to Achieve Goals: Good ADL Goals Pt Will Perform Grooming: with supervision;standing Pt Will Perform Lower Body Bathing: with supervision;sit to/from stand Pt Will Perform Lower Body Dressing: with supervision;sit to/from stand Pt Will Transfer to Toilet: with supervision;ambulating Pt Will Perform Toileting - Clothing Manipulation and hygiene: with supervision;sit to/from stand Additional ADL Goal #1: Pt to recall and verbalize 3 fall prevention strategies with 0 verbal cues. Additional ADL Goal #2: Pt to recall and verbalize 3 energy conservation strategies with 0 verbal cues. Additional ADL Goal #3: Pt to tolerate standing up to 5 min with supervision with oxygen maintaining above 90%, in preparation for ADLs.  Plan Discharge plan needs to be updated    Co-evaluation                 AM-PAC OT "6 Clicks" Daily Activity     Outcome Measure   Help from another person eating meals?: A Little Help from another person taking care of personal grooming?: A Lot Help from another person toileting, which includes using toliet, bedpan, or urinal?: A Lot Help from another person bathing (including washing, rinsing, drying)?: A Lot Help from another person to put on and taking off regular upper body clothing?: A Lot Help from another person to put on and taking off regular lower body clothing?: Total 6 Click Score: 12    End of Session Equipment Utilized During Treatment: Oxygen(15 L HFNC; 15 L NRB)  OT Visit Diagnosis: Unsteadiness on feet (R26.81);Muscle weakness (generalized) (M62.81)   Activity Tolerance Patient limited by fatigue   Patient Left in bed;with call bell/phone within reach;with bed alarm set(modified chair position)   Nurse Communication Mobility status;Other (comment)(encourage useof theraband)        TimeER:[id number] OT Time Calculation (min): 15 min  Charges: OT General Charges $OT Visit: 1 Visit OT Treatments $Therapeutic Activity: 8-22 mins  Maurie Boettcher, OT/L   Acute OT Clinical Specialist [place] Pager [phone] Office [phone]    [facility] 07/25/2019, 2:11 PM

## 2019-07-25 NOTE — Progress Notes (Signed)
PHARMACY - PHYSICIAN COMMUNICATION CRITICAL VALUE ALERT - BLOOD CULTURE IDENTIFICATION (BCID)  [name] is an 64 y.o. female who presented to [facility] on 07/23/2019 with a chief complaint of COVID-19.    Assessment:  She is currently being treated for HCAP and suspected UTI. 1/27 BCx: GNR 1/29 BCID: Enterobacter cloacae complex  Name of physician (or Provider) Contacted: Dr. Waldron [facility]  Current antibiotics: Cefepime, metronidazole, vancomycin  Changes to prescribed antibiotics recommended:  Patient is on recommended antibiotics - No changes needed  Continue broad spectrum antibiotics for HCAP.  Results for orders placed or performed during the hospital encounter of 07/09/2019  Blood Culture ID Panel (Reflexed) (Collected: 07/23/2019 10:03 PM)  Result Value Ref Range   Enterococcus species NOT DETECTED NOT DETECTED   Listeria monocytogenes NOT DETECTED NOT DETECTED   Staphylococcus species NOT DETECTED NOT DETECTED   Staphylococcus aureus (BCID) NOT DETECTED NOT DETECTED   Streptococcus species NOT DETECTED NOT DETECTED   Streptococcus agalactiae NOT DETECTED NOT DETECTED   Streptococcus pneumoniae NOT DETECTED NOT DETECTED   Streptococcus pyogenes NOT DETECTED NOT DETECTED   Acinetobacter baumannii NOT DETECTED NOT DETECTED   Enterobacteriaceae species DETECTED (A) NOT DETECTED   Enterobacter cloacae complex DETECTED (A) NOT DETECTED   Escherichia coli NOT DETECTED NOT DETECTED   Klebsiella oxytoca NOT DETECTED NOT DETECTED   Klebsiella pneumoniae NOT DETECTED NOT DETECTED   Proteus species NOT DETECTED NOT DETECTED   Serratia marcescens NOT DETECTED NOT DETECTED   Carbapenem resistance NOT DETECTED NOT DETECTED   Haemophilus influenzae NOT DETECTED NOT DETECTED   Neisseria meningitidis NOT DETECTED NOT DETECTED   Pseudomonas aeruginosa NOT DETECTED NOT DETECTED   Candida albicans NOT DETECTED NOT DETECTED   Candida glabrata NOT DETECTED NOT DETECTED   Candida krusei NOT  DETECTED NOT DETECTED   Candida parapsilosis NOT DETECTED NOT DETECTED   Candida tropicalis NOT DETECTED NOT DETECTED    Gretta Arab PharmD, BCPS Clinical pharmacist phone 7am- [phone] 07/25/2019 4:44 PM

## 2019-07-25 NOTE — Progress Notes (Signed)
Assisted tele visit to patient with brother.  Bienvenido Proehl Ann, RN  

## 2019-07-25 NOTE — Progress Notes (Addendum)
PROGRESS NOTE                                                                                                                                                                                                             Patient Demographics:    [name], is a 64 y.o. female, DOB - 1956/05/10, [id number]  Outpatient Primary MD for the patient is Celene Squibb, MD   Admit date - 07/24/2019   LOS - 101  Chief Complaint  Patient presents with  . Shortness of Breath  . covid pos       Brief Narrative:  Patient is a 64 y.o. 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    Post admission-she had significant worsening of her hypoxemia-requiring 100% NRB-she was then transferred to [facility].  Hypoxia gradually improved with steroids/remdesivir and empiric antibiotics-however on 1/12-1/13-hypoxia briefly worsened-chest x-ray showed a new left-sided infiltrate.  Hospital course was also complicated by development of AKI and hypernatremia.  Patient was provided supportive care with IV fluids, and was restarted on IV antibiotics (Unasyn)-with further improvement of hypoxemia.  -1/27patient hypotensive, septic, transferred to ICU, requiring pressor support.  COVID-19 Medications:  Steroids: 1/6>> 1/15 Remdesivir: 1/6>>1/10 Convalescent Plasma: 1/7   Antibiotics: Unasyn: 1/14>> Cefepime: 1/6>>1/12 Vancomycin: 1/6>>1/8   Subjective:   Patient in bed, patient reports worsening dyspnea iscussed with staff no appears  comfortable, denies any headache, no fever, no chest pain or pressure, no shortness of breath , no abdominal pain did report some dyspnea today.   Assessment  & Plan :   Septic shock/UTI/Bacteremia/HCAP -Transferred to ICU 1/28, requiring pressor support, initially on Levophed, as well with low CVP required fluid resuscitation as well, CV P has normalized, she is off pressors this morning. -This is in the setting of UTI/gram-negative rod bacteremia, as well HCAP. -Broaden her antibiotic coverage given worsening respiratory status today, will add Vanco and Flagyl to her cefepime. -Continue to follow final results on urine cultures and blood cultures. -Chest x-ray showing worsening  bilateral opacity, and she had increased work of breathing, and oxygen requirement, she received 1 dose of Lasix as well.   Acute Hypoxic Resp Failure due to Covid 19 Viral pneumonia  - with  concurrent bacterial pneumonia with underlying amiodarone induced lung injury. -Patient had fluctuating oxygen requirement during hospital stay , he has significantly increased oxygen requirement, currently on 4 L heated high flow and NRB. -Chest x-ray this morning showing worsening COVID-19 for pneumonia . -Speech therapy is following the patient continue dysphagia 3 diet.  Continue using I-S and flutter valve for pulmonary toiletry.    O2 requirements:  SpO2: 98 % O2 Flow Rate (L/min): 40 L/min FiO2 (%): 100 %   COVID-19 Labs: Recent Labs    07/23/19 0303 07/24/19 0456 07/25/19 0410  DDIMER 2.21* 0.63* 0.72*  CRP 4.2* 12.2* 8.6*       Component Value Date/Time   BNP 275.4 (H) 07/19/2019 0115    Recent Labs  Lab 07/22/19 0246 07/23/19 0303 07/24/19 0456 07/25/19 0410  PROCALCITON 0.19 26.79 36.12 20.45    Lab Results  Component Value Date   SARSCOV2NAA Detected (A) 06/26/2019   [place] NEGATIVE 02/20/2019   [place] Not Detected 12/18/2018    AKI on CKD stage IIIa: Serum creatinine today,  hold Lasix  Acute metabolic encephalopathy: This seems to have resolved.  HTN: Given soft blood pressure will discontinue metoprolol  Persistent atrial fibrillation s/p AV node ablation and PPM insertion:  -Hold metoprolol given hypotension, will change Xarelto to heparin GTT given worsening renal function . -has history of amiodarone induced lung toxicity  Chronic combined systolic and diastolic heart failure percent on echocardiogram in 02/2019:  -Repeat 2D echo this morning showing improved EF to 50%.  Bronchial asthma: Appears stable-no rhonchi-continue bronchodilators.  DM-2 (A1c 6.8): Hypoglycemic episode due to tapering of steroids, now her CBG uncontrolled, she is back on sliding scale, and 3 units before meals  CBG (last 3)  Recent Labs    07/24/19 2106 07/25/19 0806 07/25/19 1233  GLUCAP 320* 91 115*   Hypothyroidism: Continue levothyroxine  History of amiodarone induced lung toxicity/ILD: Supportive care.  Remote history of ASD repair - no acute issues.  Deconditioning/debility: Continue PT OT and speech therapy, patient and family are planning home with home health.  Hyperkalemia.  Kayexalate and monitor.  Cyanosis of both feet.  Likely due to mild hypoperfusion, digits are nonpainful, advised to staff to have the patient wear multiple socks and have warm blankets around her feet.  She has good sensation and movement.  Positive pulses.  Goals of care -I have discussed with her brother, patient is critically ill, with significant sepsis, continues to deteriorate, for now we will continue with full scope of treatment, but if she continues to decline, then will need to focus on comfort, for now we will start on low-dose as needed morphine for her significant dyspnea and air hunger, plan was discussed in details with her brother.  Consults  :  PCCM  Procedures  :  None  Condition -remains in ICU  Family Communication  : Discussed with brother via phone   Code  Status :  DNR  Diet :  Diet Order            DIET DYS 3 Room service appropriate? Yes; Fluid consistency: Thin  Diet effective now               Disposition Plan  : Health PT once oxygenation improves further.    Antimicorbials  :  Anti-infectives (From admission, onward)   Start     Dose/Rate Route Frequency Ordered Stop   07/26/19 1600  vancomycin (VANCOREADY) IVPB 750 mg/150 mL     750 mg 150 mL/hr over 60 Minutes Intravenous Every 24 hours 07/25/19 1544     07/25/19 1600  metroNIDAZOLE (FLAGYL) IVPB 500 mg     500 mg 100 mL/hr over 60 Minutes Intravenous Every 8 hours 07/25/19 1524     07/25/19 1600  vancomycin (VANCOCIN) IVPB 1000 mg/200 mL premix     1,000 mg 200 mL/hr over 60 Minutes Intravenous  Once 07/25/19 1544     07/24/19 1030  ceFEPIme (MAXIPIME) 2 g in sodium chloride 0.9 % 100 mL IVPB     2 g 200 mL/hr over 30 Minutes Intravenous Every 12 hours 07/24/19 1012     07/23/19 1600  vancomycin (VANCOCIN) IVPB 1000 mg/200 mL premix     1,000 mg 200 mL/hr over 60 Minutes Intravenous  Once 07/23/19 1517 07/23/19 1827   07/23/19 1534  vancomycin variable dose per unstable renal function (pharmacist dosing)  Status:  Discontinued      Does not apply See admin instructions 07/23/19 1534 07/24/19 1032   07/23/19 1530  ceFEPIme (MAXIPIME) 2 g in sodium chloride 0.9 % 100 mL IVPB  Status:  Discontinued     2 g 200 mL/hr over 30 Minutes Intravenous Every 24 hours 07/23/19 1517 07/24/19 1012   07/23/19 1500  metroNIDAZOLE (FLAGYL) IVPB 500 mg  Status:  Discontinued     500 mg 100 mL/hr over 60 Minutes Intravenous Every 8 hours 07/23/19 1458 07/24/19 1032   07/15/19 1000  amoxicillin-clavulanate (AUGMENTIN) 500-125 MG per tablet 500 mg    Note to Pharmacy: Pharmacy can adjust for aspiration pneumonia, stop date 07/17/2019   1 tablet Oral 2 times daily 07/15/19 0754 07/17/19 2100   07/10/19 1000  ampicillin-sulbactam (UNASYN) 1.5 g in sodium chloride 0.9 % 100 mL IVPB   Status:  Discontinued     1.5 g 200 mL/hr over 30 Minutes Intravenous Every 8 hours 07/10/19 0945 07/15/19 0754   07/04/19 1830  vancomycin (VANCOREADY) IVPB 750 mg/150 mL  Status:  Discontinued     750 mg 150 mL/hr over 60 Minutes Intravenous Every 48 hours 07/03/19 1249 07/04/19 1110   07/04/19 1800  vancomycin (VANCOCIN) IVPB 1000 mg/200 mL premix  Status:  Discontinued     1,000 mg 200 mL/hr over 60 Minutes Intravenous Every 48 hours 07/05/2019 1727 07/03/19 1249   07/03/19 1800  ceFEPIme (MAXIPIME) 1 g in sodium chloride 0.9 % 100 mL IVPB  Status:  Discontinued     1 g 200 mL/hr over 30 Minutes Intravenous Every 24 hours 07/23/2019 1725 07/03/19 1248   07/03/19 1800  ceFEPIme (MAXIPIME) 2 g in sodium chloride 0.9 % 100 mL IVPB     2 g 200 mL/hr over 30 Minutes Intravenous Every 24 hours 07/03/19 1247 07/08/19 1850   07/03/19 1000  remdesivir 100 mg in sodium chloride 0.9 % 100 mL IVPB     100 mg 200 mL/hr over 30 Minutes Intravenous Daily 07/15/2019 1723 07/06/19 0926   07/03/19 1000  remdesivir 100 mg in sodium chloride 0.9 % 100 mL IVPB  Status:  Discontinued     100 mg 200 mL/hr over 30 Minutes Intravenous Daily 07/21/2019 2355 07/03/19 0257   07/13/2019 2354  remdesivir 200 mg in sodium chloride 0.9% 250 mL IVPB  Status:  Discontinued     200 mg 580 mL/hr  over 30 Minutes Intravenous Once 07/07/2019 2355 07/03/19 0257   07/01/2019 1800  remdesivir 200 mg in sodium chloride 0.9% 250 mL IVPB     200 mg 580 mL/hr over 30 Minutes Intravenous Once 07/04/2019 1723 07/16/2019 2306   07/11/2019 1730  vancomycin (VANCOCIN) IVPB 1000 mg/200 mL premix  Status:  Discontinued     1,000 mg 200 mL/hr over 60 Minutes Intravenous  Once 07/08/2019 1722 07/20/2019 1725   07/13/2019 1730  ceFEPIme (MAXIPIME) 2 g in sodium chloride 0.9 % 100 mL IVPB     2 g 200 mL/hr over 30 Minutes Intravenous  Once 06/27/2019 1722 07/01/2019 1918   07/23/2019 1730  vancomycin (VANCOREADY) IVPB 1500 mg/300 mL     1,500 mg 150 mL/hr over 120  Minutes Intravenous  Once 07/01/2019 1725 07/24/2019 2036      DVT Prophylaxis  : Xarelto  Inpatient Medications  Scheduled Meds: . vitamin C  500 mg Oral Daily  . chlorhexidine  15 mL Mouth Rinse BID  . Chlorhexidine Gluconate Cloth  6 each Topical Daily  . fluticasone furoate-vilanterol  2 puff Inhalation Daily  . hydrocortisone sod succinate (SOLU-CORTEF) inj  50 mg Intravenous Q12H  . insulin aspart  0-5 Units Subcutaneous QHS  . insulin aspart  0-9 Units Subcutaneous TID WC  . insulin aspart  3 Units Subcutaneous TID WC  . levothyroxine  100 mcg Oral QAC breakfast  . [START ON 07/26/2019] mouth rinse  15 mL Mouth Rinse BID  . pantoprazole (PROTONIX) IV  40 mg Intravenous Q12H  . pravastatin  10 mg Oral Daily  . rivaroxaban  15 mg Oral Daily  . zinc sulfate  220 mg Oral Daily   Continuous Infusions: . sodium chloride Stopped (07/23/19 1729)  . sodium chloride Stopped (07/25/19 1558)  . sodium chloride    . ceFEPime (MAXIPIME) IV Stopped (07/25/19 1035)  . metronidazole 100 mL/hr at 07/25/19 1600  . norepinephrine (LEVOPHED) Adult infusion Stopped (07/24/19 1443)  . vancomycin    . [START ON 07/26/2019] vancomycin     PRN Meds:.sodium chloride, acetaminophen, albuterol, calcium carbonate, chlorpheniramine-HYDROcodone, guaiFENesin-dextromethorphan, lip balm, Melatonin, morphine injection, ondansetron (ZOFRAN) IV, senna-docusate   See all Orders from today for further details   Phillips Climes M.D on 07/25/2019 at 4:21 PM  To page go to [email or web address] - use universal password  [facility] -  Office  [phone]    Objective:   Vitals:   07/25/19 1200 07/25/19 1300 07/25/19 1400 07/25/19 1600  BP: 138/72 121/86 101/73 128/69  Pulse: 69 75 75 71  Resp: (!) 35 (!) 27 (!) 30 (!) 29  Temp:  98.1 F (36.7 C)    TempSrc:  Axillary    SpO2: 91% (!) 89% 92% 98%  Weight:      Height:        Wt Readings from Last 3 Encounters:  07/23/19 59.4 kg  04/01/19 65.8 kg   04/01/19 64.4 kg     Intake/Output Summary (Last 24 hours) at 07/25/2019 1621 Last data filed at 07/25/2019 1600 Gross per 24 hour  Intake 1937.24 ml  Output 1850 ml  Net 87.24 ml     Physical Exam  Awake Alert, answering questions appropriately, Symmetrical Chest wall movement, tachypneic, with increased work of breathing, with coarse bilateral respiratory sounds RRR,No Gallops,Rubs or new Murmurs, No Parasternal Heave +ve B.Sounds, Abd Soft, No tenderness, No rebound - guarding or rigidity. No Cyanosis, Clubbing or edema, No new Rash or bruise  Data Review:    CBC Recent Labs  Lab 07/21/19 0228 07/21/19 0228 07/22/19 0246 07/22/19 1725 07/23/19 0303 07/24/19 0456 07/25/19 0410  WBC 17.8*   < > 16.8* 20.4* 20.2* 21.4* 11.7*  HGB 16.1*   < > 15.8* 14.9 14.9 11.5* 11.5*  HCT 51.0*   < > 49.7* 46.3* 46.1* 36.0 36.2  PLT 160   < > 154 134* PLATELET CLUMPS NOTED ON SMEAR, COUNT APPEARS ADEQUATE 121* 89*  MCV 98.5   < > 98.2 99.4 96.8 97.6 99.5  MCH 31.1   < > 31.2 32.0 31.3 31.2 31.6  MCHC 31.6   < > 31.8 32.2 32.3 31.9 31.8  RDW 14.1   < > 14.0 14.1 14.0 14.0 14.2  LYMPHSABS 1.1  --  1.0  --  0.4* 0.3* 0.6*  MONOABS 0.9  --  0.8  --  0.1 0.9 0.8  EOSABS 0.2  --  0.2  --  0.0 0.0 0.1  BASOSABS 0.1  --  0.1  --  0.0 0.0 0.0   < > = values in this interval not displayed.    Chemistries  Recent Labs  Lab 07/20/19 0303 07/20/19 0303 07/21/19 0228 07/22/19 0246 07/23/19 0303 07/24/19 0456 07/25/19 0410  NA 139   < > 137 134* 138 139 143  K 4.6   < > 4.7 6.0* 3.5 3.2* 4.5  CL 93*   < > 92* 91* 91* 102 111  CO2 32   < > 31 31 27 27 24   GLUCOSE 22*   < > 36* 34* <20* 244* 71  BUN 58*   < > 65* 76* 77* 59* 46*  CREATININE 1.21*   < > 1.25* 1.47* 1.88* 1.27* 0.97  CALCIUM 9.2   < > 9.3 8.9 8.5* 7.9* 8.3*  MG 2.1  --  2.1 2.2 1.7 2.0  --   AST 36   < > 25 36 54* 23 29  ALT 61*   < > 51* 50* 72* 43 39  ALKPHOS 216*   < > 204* 187* 212* 154* 147*  BILITOT  1.0   < > 1.0 1.0 1.3* 0.9 1.0   < > = values in this interval not displayed.   ------------------------------------------------------------------------------------------------------------------ No results for input(s): CHOL, HDL, LDLCALC, TRIG, CHOLHDL, LDLDIRECT in the last 72 hours.  Lab Results  Component Value Date   HGBA1C 6.8 (H) 07/06/2019   ------------------------------------------------------------------------------------------------------------------ No results for input(s): TSH, T4TOTAL, T3FREE, THYROIDAB in the last 72 hours.  Invalid input(s): FREET3 ------------------------------------------------------------------------------------------------------------------ No results for input(s): VITAMINB12, FOLATE, FERRITIN, TIBC, IRON, RETICCTPCT in the last 72 hours.  Coagulation profile No results for input(s): INR, PROTIME in the last 168 hours.  Recent Labs    07/24/19 0456 07/25/19 0410  DDIMER 0.63* 0.72*    Cardiac Enzymes No results for input(s): CKMB, TROPONINI, MYOGLOBIN in the last 168 hours.  Invalid input(s): CK ------------------------------------------------------------------------------------------------------------------    Component Value Date/Time   BNP 275.4 (H) 07/19/2019 0115    Micro Results Recent Results (from the past 240 hour(s))  MRSA PCR Screening     Status: None   Collection Time: 07/23/19 10:23 AM   Specimen: Nasopharyngeal  Result Value Ref Range Status   MRSA by PCR NEGATIVE NEGATIVE Final    Comment:        The GeneXpert MRSA Assay (FDA approved for NASAL specimens only), is one component of a comprehensive MRSA colonization surveillance program. It is not intended to diagnose MRSA infection nor to  guide or monitor treatment for MRSA infections. Performed at [facility], [place] [address], [place], [place] [address]   Culture, blood (routine x 2)     Status: None (Preliminary result)   Collection  Time: 07/23/19 10:03 PM   Specimen: BLOOD LEFT HAND  Result Value Ref Range Status   Specimen Description   Final    BLOOD LEFT HAND Performed at [facility] [address], [place], [place] [address]    Special Requests   Final    BOTTLES DRAWN AEROBIC ONLY Blood Culture results may not be optimal due to an inadequate volume of blood received in culture bottles Performed at [facility] [address] [place], [place] [address]    Culture  Setup Time   Final    GRAM NEGATIVE RODS AEROBIC BOTTLE ONLY Organism ID to follow Performed at [facility], [place] [address], [place], [place] [address]    Culture PENDING  Incomplete   Report Status PENDING  Incomplete  Culture, blood (routine x 2)     Status: None (Preliminary result)   Collection Time: 07/23/19 10:08 PM   Specimen: BLOOD RIGHT HAND  Result Value Ref Range Status   Specimen Description   Final    BLOOD RIGHT HAND Performed at [facility] [address], [place], [place] [address]    Special Requests   Final    BOTTLES DRAWN AEROBIC ONLY Blood Culture results may not be optimal due to an inadequate volume of blood received in culture bottles Performed at [facility] [address], [place], [place] [address]    Culture   Final    NO GROWTH 1 DAY Performed at [facility], [place] [address], [place], [place] [address]    Report Status PENDING  Incomplete    Radiology Reports DG Chest Port 1 View  Result Date: 07/25/2019 CLINICAL DATA:  Hypoxia EXAM: PORTABLE CHEST 1 VIEW COMPARISON:  Two days ago FINDINGS: Cardiomegaly. Biventricular pacer from the left. Diffuse pulmonary opacities are progressed. Right IJ line with tip at the upper cavoatrial junction. No evident effusion or pneumothorax IMPRESSION: Worsening bilateral pneumonia. Electronically Signed   By: Monte Fantasia M.D.   On: 07/25/2019 11:28   DG CHEST PORT 1  VIEW  Result Date: 07/23/2019 CLINICAL DATA:  Central line placement. EXAM: PORTABLE CHEST 1 VIEW COMPARISON:  Chest x-ray from same day at 0653 hours. FINDINGS: New right internal jugular central venous catheter with tip in the distal SVC. Unchanged left chest wall pacemaker. Stable cardiomegaly. Patchy airspace disease throughout both lungs has mildly worsened. No pleural effusion or pneumothorax. No acute osseous abnormality. IMPRESSION: 1. New right internal jugular central venous catheter without complicating feature. 2. Worsened multifocal pneumonia. Electronically Signed   By: Titus Dubin M.D.   On: 07/23/2019 17:39   DG CHEST PORT 1 VIEW  Result Date: 07/23/2019 CLINICAL DATA:  Shortness of breath EXAM: PORTABLE CHEST 1 VIEW COMPARISON:  Radiograph 07/16/2019, CT 06/12/2017 FINDINGS: Pacer pack overlies the left chest wall with leads at the cardiac apex and coronary sinus. Telemetry leads overlie the chest. Nasal cannula projects over the upper chest. Surgical clips are seen at the base of the neck. Interval improvement in the diffuse bilateral opacities throughout both lungs with more focal consolidation in the left lung base. No visible pneumothorax. No effusion. No acute osseous or soft tissue abnormality. Degenerative changes are present in the imaged spine and shoulders.  Severe curvature of the thoracolumbar spine with thoracic levocurvature and lumbar dextrocurvature. IMPRESSION: Interval improvement in the diffuse bilateral opacities likely reflecting COVID-19 pneumonia. Electronically Signed   By: Lovena Le M.D.   On: 07/23/2019 06:53   DG Chest Port 1 View  Result Date: 07/16/2019 CLINICAL DATA:  Shortness of breath. EXAM: PORTABLE CHEST 1 VIEW COMPARISON:  07/09/2019 and CT chest 06/12/2017. FINDINGS: Trachea is midline. Heart is enlarged, stable. Pacemaker lead tips are stable in position. Worsening diffuse bilateral airspace opacification with left lower lobe consolidation. No  definite pleural fluid. Thyroidectomy clips. Probable bone infarct or enchondroma in the proximal right humerus. IMPRESSION: Worsening bilateral airspace opacification, consistent with worsening COVID-19 pneumonia. Electronically Signed   By: Lorin Picket M.D.   On: 07/16/2019 09:04   DG CHEST PORT 1 VIEW  Result Date: 07/09/2019 CLINICAL DATA:  Shortness of breath EXAM: PORTABLE CHEST 1 VIEW COMPARISON:  07/06/2019 FINDINGS: Patchy bilateral airspace disease again noted, worsening since prior study, particularly in the left lower lobe. Cardiomegaly. Pacer remains in place, unchanged. No effusions or pneumothorax. No acute bony abnormality. IMPRESSION: Patchy bilateral airspace opacities, worsening since prior study, particularly in the left lower lobe. Electronically Signed   By: Rolm Baptise M.D.   On: 07/09/2019 21:47   DG Chest Port 1 View  Result Date: 07/03/2019 CLINICAL DATA:  COVID-19 positive.  Worsening shortness of breath. EXAM: PORTABLE CHEST 1 VIEW COMPARISON:  07/20/2019. FINDINGS: Surgical clips noted over the neck. AICD in stable position. Stable cardiomegaly. Diffuse severe bilateral pulmonary infiltrates again noted. Slight improvement in aeration on today's exam. No pleural effusion pneumothorax. Old infarct proximal right humerus. No acute bony abnormality. IMPRESSION: 1.  AICD in stable position.  Stable cardiomegaly. 2. Diffuse severe bilateral pulmonary infiltrates again noted. Slight improvement in aeration on today's exam. Electronically Signed   By: Marcello Moores  Register   On: 07/03/2019 07:15   DG Chest Port 1 View  Result Date: 07/11/2019 CLINICAL DATA:  Hypoxia. EXAM: PORTABLE CHEST 1 VIEW COMPARISON:  None. FINDINGS: There is a dual lead AICD. Moderate severity diffuse bilateral infiltrates are seen. There is a small right pleural effusion. No pneumothorax is identified. The cardiac silhouette is markedly enlarged. Multiple radiopaque surgical clips are seen overlying the  superior mediastinum. There is marked severity levoscoliosis of the thoracic spine with moderate severity multilevel degenerative changes. IMPRESSION: 1. Moderate severity diffuse bilateral infiltrates with a small right pleural effusion. 2. Marked enlargement of the cardiac silhouette. 3. Dual lead AICD in place. Electronically Signed   By: Virgina Norfolk M.D.   On: 07/10/2019 15:50   DG Chest Port 1V today  Result Date: 07/06/2019 CLINICAL DATA:  Dyspnea, COVID-19 positive EXAM: PORTABLE CHEST 1 VIEW COMPARISON:  07/03/2019 chest radiograph. FINDINGS: Stable configuration of 2 lead left subclavian pacemaker. Surgical clips overlie the central lower neck. Stable cardiomediastinal silhouette with mild cardiomegaly. No pneumothorax. No pleural effusion. Patchy hazy opacities throughout the mid to lower lungs bilaterally, similar to minimally improved. IMPRESSION: Patchy hazy opacities throughout the mid to lower lungs bilaterally, similar to minimally improved, compatible with COVID-19 pneumonia. Stable cardiomegaly. Electronically Signed   By: Ilona Sorrel M.D.   On: 07/06/2019 13:09   ECHOCARDIOGRAM COMPLETE  Result Date: 07/23/2019   ECHOCARDIOGRAM REPORT   Patient Name:   KRYSTY HULKE        Date of Exam: 07/23/2019 Medical Rec #:  [id number]              Height:  58.0 in Accession #:    [id number]             Weight:       132.1 lb Date of Birth:  [birth year]             BSA:          1.53 m Patient Age:    80 years               BP:           96/52 mmHg Patient Gender: F                      HR:           72 bpm. Exam Location:  Inpatient [facility] Procedure: 2D Echo Indications:    Hypotension  History:        Patient has prior history of Echocardiogram examinations, most                 recent 03/25/2019. CHF, Pacemaker, Covis-19 Positive,                 Arrythmias:Atrial Fibrillation; Risk Factors:Diabetes,                 Hypertension and Dyslipidemia.  Sonographer:    Mikki Santee  RDCS (AE) Referring Phys: [id number] Trice Aspinall S Kimani Hovis IMPRESSIONS  1. Visually appears to have mild-moderate LVH, most prominent in septum. Overall normal LVEF on this study, improved from prior, with LV apical hypokinesis and prominent apical wall thickness.  2. Left ventricular ejection fraction, by visual estimation, is 55 to 60%. The left ventricle has normal function. There is moderately increased left ventricular hypertrophy.  3. Moderate hypokinesis of the left ventricular, apical apical segment.  4. Left ventricular diastolic parameters are indeterminate.  5. The left ventricle demonstrates regional wall motion abnormalities.  6. Global right ventricle has low normal systolic function.The right ventricular size is normal. No increase in right ventricular wall thickness.  7. Left atrial size was severely dilated.  8. Right atrial size was normal.  9. Small pericardial effusion. 10. Mild mitral annular calcification. 11. The mitral valve is normal in structure. Mild mitral valve regurgitation. 12. The tricuspid valve is normal in structure. 13. The tricuspid valve is normal in structure. Tricuspid valve regurgitation is trivial. 14. The aortic valve is tricuspid. Aortic valve regurgitation is trivial. 15. The pulmonic valve was grossly normal. Pulmonic valve regurgitation is trivial. 16. A pacer wire is visualized in the RA and RV. FINDINGS  Left Ventricle: Left ventricular ejection fraction, by visual estimation, is 55 to 60%. The left ventricle has normal function. Moderate hypokinesis of the left ventricular, apical apical segment. The left ventricle demonstrates regional wall motion abnormalities. There is moderately increased left ventricular hypertrophy. Asymmetric left ventricular hypertrophy of the basal-septal wall. Left ventricular diastolic parameters are indeterminate. Right Ventricle: The right ventricular size is normal. No increase in right ventricular wall thickness. Global RV systolic function is  has low normal systolic function. Left Atrium: Left atrial size was severely dilated. Right Atrium: Right atrial size was normal in size Pericardium: A small pericardial effusion is present. Mitral Valve: The mitral valve is normal in structure. Mild mitral annular calcification. Mild mitral valve regurgitation. Tricuspid Valve: The tricuspid valve is normal in structure. Tricuspid valve regurgitation is trivial. Aortic Valve: The aortic valve is tricuspid. Aortic valve regurgitation is trivial. Pulmonic Valve: The pulmonic valve was grossly normal. Pulmonic valve regurgitation  is trivial. Pulmonic regurgitation is trivial. Aorta: The aortic root, ascending aorta and aortic arch are all structurally normal, with no evidence of dilitation or obstruction. Pulmonary Artery: The pulmonary artery is not well seen. IAS/Shunts: No atrial level shunt detected by color flow Doppler. Additional Comments: A pacer wire is visualized in the right atrium and right ventricle.  LEFT VENTRICLE PLAX 2D LVIDd:         4.21 cm  Diastology LVIDs:         2.91 cm  LV e' lateral: 5.22 cm/s LV PW:         1.10 cm  LV e' medial:  5.77 cm/s LV IVS:        1.72 cm LVOT diam:     1.90 cm LV SV:         47 ml LV SV Index:   29.34 LVOT Area:     2.84 cm  RIGHT VENTRICLE TAPSE (M-mode): 1.7 cm LEFT ATRIUM              Index       RIGHT ATRIUM           Index LA diam:        3.90 cm  2.55 cm/m  RA Area:     15.40 cm LA Vol (A2C):   99.6 ml  65.24 ml/m RA Volume:   35.70 ml  23.39 ml/m LA Vol (A4C):   96.8 ml  63.41 ml/m LA Biplane Vol: 106.0 ml 69.44 ml/m  AORTIC VALVE LVOT Vmax:   190.00 cm/s LVOT Vmean:  118.000 cm/s LVOT VTI:    0.356 m  AORTA Ao Root diam: 3.00 cm TRICUSPID VALVE TR Peak grad:   20.2 mmHg TR Vmax:        225.00 cm/s  SHUNTS Systemic VTI:  0.36 m Systemic Diam: 1.90 cm  Buford Dresser MD Electronically signed by Buford Dresser MD Signature Date/Time: 07/23/2019/1:37:02 PM    Final    [place] EKG SITE  RITE  Result Date: 07/23/2019 If Site Rite image not attached, placement could not be confirmed due to current cardiac rhythm.

## 2019-07-25 NOTE — Progress Notes (Signed)
Pharmacy Antibiotic Note  [name] is a 64 y.o. female admitted on 07/07/2019 with COVID-19 pneumonia.  She has previously completed courses of antibiotics (Cefepime/Vanc, Unasyn > Augmentin) for suspected bacterial pneumonia.  She was narrowed to Cefepime alone, but is being restarted on Vanc and metronidazole today d/t suspected HCAP with worsening CXR. SCr continues to improve to 0.97, WBC decreased to 11.7, she remains Afebrile.  Pressors have been d/c.  MRSA PCR is negative.  Plan: Metronidazole 500 mg IV q8h per MD.  Continue Cefepime 2g IV q12h Vancomycin 1g IV x1 then 750 mg IV q24h. Measure Vanc peak and trough at steady state. Goal AUC = 400 - 550.  Est AUC 497 using SCr 1 Follow up renal function, culture results, and clinical course.   Height: 4\' 10"  (147.3 cm) Weight: 130 lb 15.3 oz (59.4 kg) IBW/kg (Calculated) : 40.9  Temp (24hrs), Avg:98.2 F (36.8 C), Min:98 F (36.7 C), Max:98.7 F (37.1 C)  Recent Labs  Lab 07/21/19 0228 07/21/19 0228 07/22/19 0246 07/22/19 1725 07/23/19 0303 07/24/19 0456 07/25/19 0410  WBC 17.8*   < > 16.8* 20.4* 20.2* 21.4* 11.7*  CREATININE 1.25*  --  1.47*  --  1.88* 1.27* 0.97   < > = values in this interval not displayed.    Estimated Creatinine Clearance: 45.3 mL/min (by C-G formula based on SCr of 0.97 mg/dL).    Allergies  Allergen Reactions  . Flecainide Nausea Only and Other (See Comments)    Faint feeling  . Hydrocodone-Acetaminophen Nausea Only and Other (See Comments)    Severe headache  . Ibuprofen Other (See Comments)    Kidney dysfunction  . Oxycodone Hcl Nausea Only and Other (See Comments)    Headache  . Penicillins Nausea Only and Other (See Comments)    Severe headache Has patient had a PCN reaction causing immediate rash, facial/tongue/throat swelling, SOB or lightheadedness with hypotension: No Has patient had a PCN reaction causing severe rash involving mucus membranes or skin necrosis: No Has  patient had a PCN reaction that required hospitalization: No Has patient had a PCN reaction occurring within the last 10 years: No If all of the above answers are "NO", then may proceed with Cephalosporin use.     Antimicrobials this admission: 1/6 Remdesivir >> 1/10 1/6 Vancomycin >> 1/8, resumed 1/27 >> 1/28, resumed 1/29 >>  1/6 Cefepime >> 1/12, resumed 1/27 >>   1/14 Unasyn >> 1/19 1/19 Augmentin >> 1/21 1/27 metronidazole >> 1/28, resumed 1/29 >>   Dose adjustments this admission:  Microbiology results: 12/31 PTA COVID - positive 1/6 BCx - negative 1/27 MRSA PCR: neg 1/27 BCx: GNR 1/28 UCx:   Thank you for allowing pharmacy to be a part of this patient's care.  Gretta Arab PharmD, BCPS Clinical pharmacist phone 7am- 5pm: [phone] 07/25/2019 3:27 PM

## 2019-07-25 NOTE — Progress Notes (Signed)
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 Pt's sats recovered in apx 15 to low 90s.

## 2019-07-26 DIAGNOSIS — R7881 Bacteremia: Secondary | ICD-10-CM

## 2019-07-26 LAB — CBC WITH DIFFERENTIAL/PLATELET
Abs Immature Granulocytes: 0.27 10*3/uL — ABNORMAL HIGH (ref 0.00–0.07)
Basophils Absolute: 0.1 10*3/uL (ref 0.0–0.1)
Basophils Relative: 1 %
Eosinophils Absolute: 0.1 10*3/uL (ref 0.0–0.5)
Eosinophils Relative: 1 %
HCT: 38.9 % (ref 36.0–46.0)
Hemoglobin: 12.2 g/dL (ref 12.0–15.0)
Immature Granulocytes: 3 %
Lymphocytes Relative: 6 %
Lymphs Abs: 0.5 10*3/uL — ABNORMAL LOW (ref 0.7–4.0)
MCH: 31.5 pg (ref 26.0–34.0)
MCHC: 31.4 g/dL (ref 30.0–36.0)
MCV: 100.5 fL — ABNORMAL HIGH (ref 80.0–100.0)
Monocytes Absolute: 0.4 10*3/uL (ref 0.1–1.0)
Monocytes Relative: 5 %
Neutro Abs: 7.7 10*3/uL (ref 1.7–7.7)
Neutrophils Relative %: 84 %
Platelets: 94 10*3/uL — ABNORMAL LOW (ref 150–400)
RBC: 3.87 MIL/uL (ref 3.87–5.11)
RDW: 14.6 % (ref 11.5–15.5)
WBC: 8.9 10*3/uL (ref 4.0–10.5)
nRBC: 0 % (ref 0.0–0.2)

## 2019-07-26 LAB — COMPREHENSIVE METABOLIC PANEL
ALT: 29 U/L (ref 0–44)
AST: 27 U/L (ref 15–41)
Albumin: 2 g/dL — ABNORMAL LOW (ref 3.5–5.0)
Alkaline Phosphatase: 138 U/L — ABNORMAL HIGH (ref 38–126)
Anion gap: 8 (ref 5–15)
BUN: 44 mg/dL — ABNORMAL HIGH (ref 8–23)
CO2: 24 mmol/L (ref 22–32)
Calcium: 8.3 mg/dL — ABNORMAL LOW (ref 8.9–10.3)
Chloride: 112 mmol/L — ABNORMAL HIGH (ref 98–111)
Creatinine, Ser: 1.15 mg/dL — ABNORMAL HIGH (ref 0.44–1.00)
GFR calc Af Amer: 59 mL/min — ABNORMAL LOW (ref >60)
GFR calc non Af Amer: 51 mL/min — ABNORMAL LOW (ref >60)
Glucose, Bld: 163 mg/dL — ABNORMAL HIGH (ref 70–99)
Potassium: 3.9 mmol/L (ref 3.5–5.1)
Sodium: 144 mmol/L (ref 135–145)
Total Bilirubin: 1.1 mg/dL (ref 0.3–1.2)
Total Protein: 5.4 g/dL — ABNORMAL LOW (ref 6.5–8.1)

## 2019-07-26 LAB — D-DIMER, QUANTITATIVE: D-Dimer, Quant: 1.44 ug/mL-FEU — ABNORMAL HIGH (ref 0.00–0.50)

## 2019-07-26 LAB — GLUCOSE, CAPILLARY
Glucose-Capillary: 133 mg/dL — ABNORMAL HIGH (ref 70–99)
Glucose-Capillary: 138 mg/dL — ABNORMAL HIGH (ref 70–99)
Glucose-Capillary: 140 mg/dL — ABNORMAL HIGH (ref 70–99)

## 2019-07-26 LAB — PROCALCITONIN: Procalcitonin: 10.43 ng/mL

## 2019-07-26 LAB — C-REACTIVE PROTEIN: CRP: 22.7 mg/dL — ABNORMAL HIGH (ref <1.0)

## 2019-07-26 MED ORDER — SODIUM CHLORIDE 0.9% FLUSH: 10.0000 mL | INTRAVENOUS | Status: DC | PRN

## 2019-07-26 MED ORDER — ENOXAPARIN SODIUM 60 MG/0.6ML ~~LOC~~ SOLN
1.0000 mg/kg | Freq: Two times a day (BID) | SUBCUTANEOUS | Status: DC
  Administered 2019-07-26 – 2019-07-28 (×5): 60 mg via SUBCUTANEOUS
  Filled 2019-07-26 (×5): qty 0.6

## 2019-07-26 MED ORDER — LEVOTHYROXINE SODIUM 100 MCG/5ML IV SOLN
50.0000 ug | Freq: Every day | INTRAVENOUS | Status: DC
  Administered 2019-07-26 – 2019-07-27 (×2): 50 ug via INTRAVENOUS
  Filled 2019-07-26 (×3): qty 5

## 2019-07-26 MED ORDER — SODIUM CHLORIDE 0.9% FLUSH
10.0000 mL | Freq: Two times a day (BID) | INTRAVENOUS | Status: DC
  Administered 2019-07-26 (×2): 10 mL
  Administered 2019-07-27: 10:00:00 20 mL
  Administered 2019-07-27 – 2019-07-28 (×2): 10 mL

## 2019-07-26 NOTE — Progress Notes (Signed)
Pt refuses oral medications and oral mucosa swabbing at this time. Will reassess and continue to monitor.

## 2019-07-26 NOTE — Progress Notes (Signed)
PROGRESS NOTE                                                                                                                                                                                                             Patient Demographics:    [name], is a 64 y.o. female, DOB - 09-09-1955, DH:[id number]  Outpatient Primary MD for the patient is Celene Squibb, MD   Admit date - 07/25/2019   LOS - 24  Chief Complaint  Patient presents with  . Shortness of Breath  . covid pos       Brief Narrative:  Patient is a 64 y.o. 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    Post admission-she had significant worsening of her hypoxemia-requiring 100% NRB-she was then transferred to [facility]  Hypoxia gradually improved with steroids/remdesivir and empiric antibiotics-however on 1/12-1/13-hypoxia briefly worsened-chest x-ray showed a new left-sided infiltrate.  Hospital course was also complicated by development of AKI and hypernatremia.  Patient was provided supportive care with IV fluids, and was restarted on IV antibiotics (Unasyn)-with further improvement of hypoxemia.  -1/27patient hypotensive, septic, transferred to ICU, requiring pressor support.  She required Levophed for 24 hours, CVP initially low, improved with hydration, patient with increased oxygen requirement, chest x-ray showing worsening bilateral opacity.  COVID-19 Medications:  Steroids: 1/6>> 1/15 Remdesivir: 1/6>>1/10 Convalescent Plasma: 1/7    Antibiotics: Unasyn: 1/14>> Cefepime: 1/6>>1/12 Vancomycin: 1/6>>1/8   Subjective:   Patient in bed, patient reports worsening dyspnea iscussed with staff no appears comfortable, denies any headache, no fever, no chest pain or pressure, no shortness of breath , no abdominal pain did report some dyspnea today.   Assessment  & Plan :   Septic shock/UTI/Enterobacter Cloace  bacteremia/HCAP -Transferred to ICU 1/28, requiring pressor support, initially on Levophed, as well with low CVP required fluid resuscitation as well, CV P has normalized, she is off pressors over last 24 hours. -This is in the setting of UTI/gram-negative rod bacteremia, as well HCAP. -Continue with broad-spectrum  antibiotic regimen vancomycin, cefepime and Flagyl . -Chest x-ray showing worsening bilateral opacity, and she had increased work of breathing, and oxygen requirement, she received 1 dose of Lasix as well. -Remains significantly altered, unsafe to swallow, so her p.o. medication has been switched to IV regimen. -Tapering stress dose IV hydrocortisone given no pressor support over last 24 hours.   Acute Hypoxic Resp Failure due to Covid 19 Viral pneumonia  - with  concurrent bacterial pneumonia with underlying amiodarone induced lung injury. -Patient had fluctuating oxygen requirement during hospital stay  -Chest x-ray this morning showing worsening COVID-19 for pneumonia . -Difficult oxygen requirement, she is requiring 50 L / 100% heated high flow nasal cannula and NRB, this is most likely in the setting of HCAP and Covid pneumonia.  With underlining lung disease due to amiodarone induced lung injury. -He did receive Lasix yesterday, CVP currently around 12-13, which is appropriate, no further indication for diuresis today.    O2 requirements:  SpO2: 90 % O2 Flow Rate (L/min): 50 L/min FiO2 (%): 100 %   COVID-19 Labs: Recent Labs    07/24/19 0456 07/25/19 0410 07/26/19 0505  DDIMER 0.63* 0.72*  1.44*  CRP 12.2* 8.6* 22.7*       Component Value Date/Time   BNP 275.4 (H) 07/19/2019 0115    Recent Labs  Lab 07/23/19 0303 07/24/19 0456 07/25/19 0410 07/26/19 0505  PROCALCITON 26.79 36.12 20.45 10.43    Lab Results  Component Value Date   SARSCOV2NAA Detected (A) 06/26/2019   [place] NEGATIVE 02/20/2019   [place] Not Detected 12/18/2018    AKI on CKD stage IIIa:  -Creatinine has been stable, continue to monitor closely.  Acute metabolic encephalopathy: This seems to have resolved.  HTN:  -Metoprolol has been stopped giving low blood pressure  Persistent atrial fibrillation s/p AV node ablation and PPM insertion:  -Hold metoprolol given hypotension, will change Xarelto to heparin GTT given worsening renal function . -has history of amiodarone induced lung toxicity  Chronic combined systolic and diastolic heart failure percent on echocardiogram in 02/2019:  -Repeat 2D echo this morning showing improved EF to 50%.  Bronchial asthma: Appears stable-no rhonchi-continue bronchodilators.  DM-2 (A1c 6.8): Hypoglycemic episode due to tapering of steroids, now her CBG uncontrolled, she is back on sliding scale, and 3 units before meals  CBG (last 3)  Recent Labs    07/25/19 2113 07/26/19 0745 07/26/19 1118  GLUCAP 182* 133* 138*   Hypothyroidism: Continue levothyroxine  History of amiodarone induced lung toxicity/ILD: Supportive care.  Remote history of ASD repair - no acute issues.  Deconditioning/debility: Continue PT OT and speech therapy, patient and family are planning home with home health.  Hyperkalemia.  Kayexalate and monitor.    Prognosis: Extremely guarded  Consults  :  PCCM  Procedures  :  None  Condition -remains in ICU  Family Communication  : Discussed with brother via phone   Code Status :  DNR  Diet :  Diet Order            DIET DYS 3 Room service appropriate? Yes; Fluid consistency: Thin  Diet effective now                Disposition Plan  : Health PT once oxygenation improves further.    Antimicorbials  :    Anti-infectives (From admission, onward)   Start     Dose/Rate Route Frequency Ordered Stop   07/26/19 1600  vancomycin (VANCOREADY) IVPB 750 mg/150 mL  750 mg 150 mL/hr over 60 Minutes Intravenous Every 24 hours 07/25/19 1544     07/25/19 1600  metroNIDAZOLE (FLAGYL) IVPB 500 mg     500 mg 100 mL/hr over 60 Minutes Intravenous Every 8 hours 07/25/19 1524     07/25/19 1600  vancomycin (VANCOCIN) IVPB 1000 mg/200 mL premix     1,000 mg 200 mL/hr over 60 Minutes Intravenous  Once 07/25/19 1544 07/25/19 1807   07/24/19 1030  ceFEPIme (MAXIPIME) 2 g in sodium chloride 0.9 % 100 mL IVPB     2 g 200 mL/hr over 30 Minutes Intravenous Every 12 hours 07/24/19 1012     07/23/19 1600  vancomycin (VANCOCIN) IVPB 1000 mg/200 mL premix     1,000 mg 200 mL/hr over 60 Minutes Intravenous  Once 07/23/19 1517 07/23/19 1827   07/23/19 1534  vancomycin variable dose per unstable renal function (pharmacist dosing)  Status:  Discontinued      Does not apply See admin instructions 07/23/19 1534 07/24/19 1032   07/23/19 1530  ceFEPIme (MAXIPIME) 2 g in sodium chloride 0.9 % 100 mL IVPB  Status:  Discontinued     2 g 200 mL/hr over 30 Minutes Intravenous Every 24 hours 07/23/19 1517 07/24/19 1012   07/23/19 1500  metroNIDAZOLE (FLAGYL) IVPB 500 mg  Status:  Discontinued     500 mg 100 mL/hr over 60 Minutes Intravenous Every 8 hours 07/23/19 1458 07/24/19 1032   07/15/19 1000  amoxicillin-clavulanate (AUGMENTIN) 500-125 MG per tablet 500 mg    Note to Pharmacy: Pharmacy can adjust for aspiration pneumonia, stop date 07/17/2019   1 tablet Oral 2 times daily 07/15/19 0754 07/17/19 2100   07/10/19 1000  ampicillin-sulbactam (UNASYN) 1.5 g in sodium chloride 0.9 % 100 mL IVPB  Status:  Discontinued     1.5 g 200 mL/hr over 30 Minutes Intravenous Every 8 hours 07/10/19 0945 07/15/19 0754   07/04/19 1830   vancomycin (VANCOREADY) IVPB 750 mg/150 mL  Status:  Discontinued     750 mg 150 mL/hr over 60 Minutes Intravenous Every 48 hours 07/03/19 1249 07/04/19 1110   07/04/19 1800  vancomycin (VANCOCIN) IVPB 1000 mg/200 mL premix  Status:  Discontinued     1,000 mg 200 mL/hr over 60 Minutes Intravenous Every 48 hours 06/28/2019 1727 07/03/19 1249   07/03/19 1800  ceFEPIme (MAXIPIME) 1 g in sodium chloride 0.9 % 100 mL IVPB  Status:  Discontinued     1 g 200 mL/hr over 30 Minutes Intravenous Every 24 hours 06/27/2019 1725 07/03/19 1248   07/03/19 1800  ceFEPIme (MAXIPIME) 2 g in sodium chloride 0.9 % 100 mL IVPB     2 g 200 mL/hr over 30 Minutes Intravenous Every 24 hours 07/03/19 1247 07/08/19 1850   07/03/19 1000  remdesivir 100 mg in sodium chloride 0.9 % 100 mL IVPB     100 mg 200 mL/hr over 30 Minutes Intravenous Daily 07/27/2019 1723 07/06/19 0926   07/03/19 1000  remdesivir 100 mg in sodium chloride 0.9 % 100 mL IVPB  Status:  Discontinued     100 mg 200 mL/hr over 30 Minutes Intravenous Daily 07/24/2019 2355 07/03/19 0257   06/30/2019 2354  remdesivir 200 mg in sodium chloride 0.9% 250 mL IVPB  Status:  Discontinued     200 mg 580 mL/hr over 30 Minutes Intravenous Once 07/25/2019 2355 07/03/19 0257   07/01/2019 1800  remdesivir 200 mg in sodium chloride 0.9% 250 mL IVPB     200 mg 580  mL/hr over 30 Minutes Intravenous Once 07/19/2019 1723 06/27/2019 2306   06/29/2019 1730  vancomycin (VANCOCIN) IVPB 1000 mg/200 mL premix  Status:  Discontinued     1,000 mg 200 mL/hr over 60 Minutes Intravenous  Once 07/08/2019 1722 07/16/2019 1725   07/13/2019 1730  ceFEPIme (MAXIPIME) 2 g in sodium chloride 0.9 % 100 mL IVPB     2 g 200 mL/hr over 30 Minutes Intravenous  Once 07/08/2019 1722 07/10/2019 1918   07/01/2019 1730  vancomycin (VANCOREADY) IVPB 1500 mg/300 mL     1,500 mg 150 mL/hr over 120 Minutes Intravenous  Once 07/06/2019 1725 07/22/2019 2036      DVT Prophylaxis  : Xarelto  Inpatient Medications  Scheduled  Meds: . vitamin C  500 mg Oral Daily  . chlorhexidine  15 mL Mouth Rinse BID  . Chlorhexidine Gluconate Cloth  6 each Topical Daily  . enoxaparin (LOVENOX) injection  1 mg/kg Subcutaneous Q12H  . fluticasone furoate-vilanterol  2 puff Inhalation Daily  . hydrocortisone sod succinate (SOLU-CORTEF) inj  50 mg Intravenous Q12H  . insulin aspart  0-5 Units Subcutaneous QHS  . insulin aspart  0-9 Units Subcutaneous TID WC  . insulin aspart  3 Units Subcutaneous TID WC  . levothyroxine  50 mcg Intravenous Daily  . mouth rinse  15 mL Mouth Rinse q12n4p  . pantoprazole (PROTONIX) IV  40 mg Intravenous Q12H  . pravastatin  10 mg Oral Daily  . sodium chloride flush  10-40 mL Intracatheter Q12H  . zinc sulfate  220 mg Oral Daily   Continuous Infusions: . sodium chloride Stopped (07/23/19 1729)  . sodium chloride 10 mL/hr at 07/25/19 1900  . sodium chloride    . ceFEPime (MAXIPIME) IV 2 g (07/26/19 1001)  . metronidazole 500 mg (07/26/19 0853)  . norepinephrine (LEVOPHED) Adult infusion Stopped (07/24/19 1443)  . vancomycin     PRN Meds:.sodium chloride, acetaminophen, albuterol, calcium carbonate, chlorpheniramine-HYDROcodone, guaiFENesin-dextromethorphan, lip balm, Melatonin, morphine injection, ondansetron (ZOFRAN) IV, senna-docusate, sodium chloride flush   See all Orders from today for further details   Phillips Climes M.D on 07/26/2019 at 3:03 PM  To page go to [email or web address] - use universal password  [facility] -  Office  [phone]    Objective:   Vitals:   07/26/19 1145 07/26/19 1200 07/26/19 1215 07/26/19 1230  BP:  129/74  139/73  Pulse: 70 69 72 72  Resp: (!) 25 (!) 24 20 (!) 22  Temp:      TempSrc:      SpO2: 92% (!) 87% 94% 90%  Weight:      Height:        Wt Readings from Last 3 Encounters:  07/23/19 59.4 kg  04/01/19 65.8 kg  04/01/19 64.4 kg     Intake/Output Summary (Last 24 hours) at 07/26/2019 1503 Last data filed at 07/26/2019 0600 Gross  per 24 hour  Intake 634.39 ml  Output 900 ml  Net -265.61 ml     Physical Exam  Patient is more lethargic today, but wakes up answering yes/no questions, but remains confused, Symmetrical Chest wall movement, tachypneic, with increased work of breathing, coarse bilateral respiratory sounds RRR,No Gallops,Rubs or new Murmurs, No Parasternal Heave +ve B.Sounds, Abd Soft, No tenderness, No rebound - guarding or rigidity. No Cyanosis, Clubbing or edema, No new Rash or bruise      Data Review:    CBC Recent Labs  Lab 07/22/19 0246 07/22/19 0246 07/22/19 1725 07/23/19 0303 07/24/19 0456 07/25/19 0410  07/26/19 0505  WBC 16.8*   < > 20.4* 20.2* 21.4* 11.7* 8.9  HGB 15.8*   < > 14.9 14.9 11.5* 11.5* 12.2  HCT 49.7*   < > 46.3* 46.1* 36.0 36.2 38.9  PLT 154   < > 134* PLATELET CLUMPS NOTED ON SMEAR, COUNT APPEARS ADEQUATE 121* 89* 94*  MCV 98.2   < > 99.4 96.8 97.6 99.5 100.5*  MCH 31.2   < > 32.0 31.3 31.2 31.6 31.5  MCHC 31.8   < > 32.2 32.3 31.9 31.8 31.4  RDW 14.0   < > 14.1 14.0 14.0 14.2 14.6  LYMPHSABS 1.0  --   --  0.4* 0.3* 0.6* 0.5*  MONOABS 0.8  --   --  0.1 0.9 0.8 0.4  EOSABS 0.2  --   --  0.0 0.0 0.1 0.1  BASOSABS 0.1  --   --  0.0 0.0 0.0 0.1   < > = values in this interval not displayed.    Chemistries  Recent Labs  Lab 07/20/19 0303 07/20/19 0303 07/21/19 0228 07/21/19 0228 07/22/19 0246 07/23/19 0303 07/24/19 0456 07/25/19 0410 07/26/19 0505  NA 139   < > 137   < > 134* 138 139 143 144  K 4.6   < > 4.7   < > 6.0* 3.5 3.2* 4.5 3.9  CL 93*   < > 92*   < > 91* 91* 102 111 112*  CO2 32   < > 31   < > 31 27 27 24 24   GLUCOSE 22*   < > 36*   < > 34* <20* 244* 71 163*  BUN 58*   < > 65*   < > 76* 77* 59* 46* 44*  CREATININE 1.21*   < > 1.25*   < > 1.47* 1.88* 1.27* 0.97 1.15*  CALCIUM 9.2   < > 9.3   < > 8.9 8.5* 7.9* 8.3* 8.3*  MG 2.1  --  2.1  --  2.2 1.7 2.0  --   --   AST 36   < > 25   < > 36 54* 23 29 27   ALT 61*   < > 51*   < > 50* 72* 43 39 29   ALKPHOS 216*   < > 204*   < > 187* 212* 154* 147* 138*  BILITOT 1.0   < > 1.0   < > 1.0 1.3* 0.9 1.0 1.1   < > = values in this interval not displayed.   ------------------------------------------------------------------------------------------------------------------ No results for input(s): CHOL, HDL, LDLCALC, TRIG, CHOLHDL, LDLDIRECT in the last 72 hours.  Lab Results  Component Value Date   HGBA1C 6.8 (H) 07/06/2019   ------------------------------------------------------------------------------------------------------------------ No results for input(s): TSH, T4TOTAL, T3FREE, THYROIDAB in the last 72 hours.  Invalid input(s): FREET3 ------------------------------------------------------------------------------------------------------------------ No results for input(s): VITAMINB12, FOLATE, FERRITIN, TIBC, IRON, RETICCTPCT in the last 72 hours.  Coagulation profile No results for input(s): INR, PROTIME in the last 168 hours.  Recent Labs    07/25/19 0410 07/26/19 0505  DDIMER 0.72* 1.44*    Cardiac Enzymes No results for input(s): CKMB, TROPONINI, MYOGLOBIN in the last 168 hours.  Invalid input(s): CK ------------------------------------------------------------------------------------------------------------------    Component Value Date/Time   BNP 275.4 (H) 07/19/2019 0115    Micro Results Recent Results (from the past 240 hour(s))  MRSA PCR Screening     Status: None   Collection Time: 07/23/19 10:23 AM   Specimen: Nasopharyngeal  Result Value Ref Range Status  MRSA by PCR NEGATIVE NEGATIVE Final    Comment:        The GeneXpert MRSA Assay (FDA approved for NASAL specimens only), is one component of a comprehensive MRSA colonization surveillance program. It is not intended to diagnose MRSA infection nor to guide or monitor treatment for MRSA infections. Performed at [facility], [place] [address] [place], [place] [address]   Culture,  blood (routine x 2)     Status: Abnormal (Preliminary result)   Collection Time: 07/23/19 10:03 PM   Specimen: BLOOD LEFT HAND  Result Value Ref Range Status   Specimen Description   Final    BLOOD LEFT HAND Performed at [place] [address], [place], [place] [address]    Special Requests   Final    BOTTLES DRAWN AEROBIC ONLY Blood Culture results may not be optimal due to an inadequate volume of blood received in culture bottles Performed at [place] [place] [address] [place], [place] [address]    Culture  Setup Time   Final    GRAM NEGATIVE RODS AEROBIC BOTTLE ONLY CRITICAL RESULT CALLED TO, READ BACK BY AND VERIFIED WITH: Starlyn Skeans PharmD 16:45 07/25/19 ([email or web address]) Performed at [facility], [place] [address], [place], [place] [address]    Culture ENTEROBACTER CLOACAE (A)  Final   Report Status PENDING  Incomplete  Blood Culture ID Panel (Reflexed)     Status: Abnormal   Collection Time: 07/23/19 10:03 PM  Result Value Ref Range Status   Enterococcus species NOT DETECTED NOT DETECTED Final   Listeria monocytogenes NOT DETECTED NOT DETECTED Final   Staphylococcus species NOT DETECTED NOT DETECTED Final   Staphylococcus aureus (BCID) NOT DETECTED NOT DETECTED Final   Streptococcus species NOT DETECTED NOT DETECTED Final   Streptococcus agalactiae NOT DETECTED NOT DETECTED Final   Streptococcus pneumoniae NOT DETECTED NOT DETECTED Final   Streptococcus pyogenes NOT DETECTED NOT DETECTED Final   Acinetobacter baumannii NOT DETECTED NOT DETECTED Final   Enterobacteriaceae species DETECTED (A) NOT DETECTED Final    Comment: Enterobacteriaceae represent a large family of gram-negative bacteria, not a single organism. CRITICAL RESULT CALLED TO, READ BACK BY AND VERIFIED WITH: Starlyn Skeans PharmD 16:45 07/25/19 ([email or web address])    Enterobacter cloacae complex DETECTED (A) NOT DETECTED Final    Comment: CRITICAL RESULT CALLED TO, READ BACK BY AND  VERIFIED WITH: Starlyn Skeans PharmD 16:45 07/25/19 ([email or web address])    Escherichia coli NOT DETECTED NOT DETECTED Final   Klebsiella oxytoca NOT DETECTED NOT DETECTED Final   Klebsiella pneumoniae NOT DETECTED NOT DETECTED Final   Proteus species NOT DETECTED NOT DETECTED Final   Serratia marcescens NOT DETECTED NOT DETECTED Final   Carbapenem resistance NOT DETECTED NOT DETECTED Final   Haemophilus influenzae NOT DETECTED NOT DETECTED Final   Neisseria meningitidis NOT DETECTED NOT DETECTED Final   Pseudomonas aeruginosa NOT DETECTED NOT DETECTED Final   Candida albicans NOT DETECTED NOT DETECTED Final   Candida glabrata NOT DETECTED NOT DETECTED Final   Candida krusei NOT DETECTED NOT DETECTED Final   Candida parapsilosis NOT DETECTED NOT DETECTED Final   Candida tropicalis NOT DETECTED NOT DETECTED Final    Comment: Performed at [facility], [place] [address] [place],  [address]  Culture, blood (routine x 2)     Status: None (Preliminary result)   Collection Time: 07/23/19 10:08 PM   Specimen: BLOOD RIGHT HAND  Result Value Ref Range Status   Specimen Description  Final    BLOOD RIGHT HAND Performed at [facility], [place] [address], [place], [place] [address]    Special Requests   Final    BOTTLES DRAWN AEROBIC ONLY Blood Culture results may not be optimal due to an inadequate volume of blood received in culture bottles Performed at [place] [address] [place], [place] [address]    Culture   Final    NO GROWTH 2 DAYS Performed at [facility] [address], [place], [place] [address]    Report Status PENDING  Incomplete    Radiology Reports DG Chest Port 1 View  Result Date: 07/25/2019 CLINICAL DATA:  Hypoxia EXAM: PORTABLE CHEST 1 VIEW COMPARISON:  Two days ago FINDINGS: Cardiomegaly. Biventricular pacer from the left. Diffuse pulmonary opacities are progressed. Right IJ line with tip at the upper cavoatrial junction.  No evident effusion or pneumothorax IMPRESSION: Worsening bilateral pneumonia. Electronically Signed   By: Monte Fantasia M.D.   On: 07/25/2019 11:28   DG CHEST PORT 1 VIEW  Result Date: 07/23/2019 CLINICAL DATA:  Central line placement. EXAM: PORTABLE CHEST 1 VIEW COMPARISON:  Chest x-ray from same day at 0653 hours. FINDINGS: New right internal jugular central venous catheter with tip in the distal SVC. Unchanged left chest wall pacemaker. Stable cardiomegaly. Patchy airspace disease throughout both lungs has mildly worsened. No pleural effusion or pneumothorax. No acute osseous abnormality. IMPRESSION: 1. New right internal jugular central venous catheter without complicating feature. 2. Worsened multifocal pneumonia. Electronically Signed   By: Titus Dubin M.D.   On: 07/23/2019 17:39   DG CHEST PORT 1 VIEW  Result Date: 07/23/2019 CLINICAL DATA:  Shortness of breath EXAM: PORTABLE CHEST 1 VIEW COMPARISON:  Radiograph 07/16/2019, CT 06/12/2017 FINDINGS: Pacer pack overlies the left chest wall with leads at the cardiac apex and coronary sinus. Telemetry leads overlie the chest. Nasal cannula projects over the upper chest. Surgical clips are seen at the base of the neck. Interval improvement in the diffuse bilateral opacities throughout both lungs with more focal consolidation in the left lung base. No visible pneumothorax. No effusion. No acute osseous or soft tissue abnormality. Degenerative changes are present in the imaged spine and shoulders. Severe curvature of the thoracolumbar spine with thoracic levocurvature and lumbar dextrocurvature. IMPRESSION: Interval improvement in the diffuse bilateral opacities likely reflecting COVID-19 pneumonia. Electronically Signed   By: Lovena Le M.D.   On: 07/23/2019 06:53   DG Chest Port 1 View  Result Date: 07/16/2019 CLINICAL DATA:  Shortness of breath. EXAM: PORTABLE CHEST 1 VIEW COMPARISON:  07/09/2019 and CT chest 06/12/2017. FINDINGS: Trachea is  midline. Heart is enlarged, stable. Pacemaker lead tips are stable in position. Worsening diffuse bilateral airspace opacification with left lower lobe consolidation. No definite pleural fluid. Thyroidectomy clips. Probable bone infarct or enchondroma in the proximal right humerus. IMPRESSION: Worsening bilateral airspace opacification, consistent with worsening COVID-19 pneumonia. Electronically Signed   By: Lorin Picket M.D.   On: 07/16/2019 09:04   DG CHEST PORT 1 VIEW  Result Date: 07/09/2019 CLINICAL DATA:  Shortness of breath EXAM: PORTABLE CHEST 1 VIEW COMPARISON:  07/06/2019 FINDINGS: Patchy bilateral airspace disease again noted, worsening since prior study, particularly in the left lower lobe. Cardiomegaly. Pacer remains in place, unchanged. No effusions or pneumothorax. No acute bony abnormality. IMPRESSION: Patchy bilateral airspace opacities, worsening since prior study, particularly in the left lower lobe. Electronically Signed   By: Rolm Baptise M.D.   On:  07/09/2019 21:47   DG Chest Port 1 View  Result Date: 07/03/2019 CLINICAL DATA:  COVID-19 positive.  Worsening shortness of breath. EXAM: PORTABLE CHEST 1 VIEW COMPARISON:  07/03/2019. FINDINGS: Surgical clips noted over the neck. AICD in stable position. Stable cardiomegaly. Diffuse severe bilateral pulmonary infiltrates again noted. Slight improvement in aeration on today's exam. No pleural effusion pneumothorax. Old infarct proximal right humerus. No acute bony abnormality. IMPRESSION: 1.  AICD in stable position.  Stable cardiomegaly. 2. Diffuse severe bilateral pulmonary infiltrates again noted. Slight improvement in aeration on today's exam. Electronically Signed   By: Marcello Moores  Register   On: 07/03/2019 07:15   DG Chest Port 1 View  Result Date: 06/30/2019 CLINICAL DATA:  Hypoxia. EXAM: PORTABLE CHEST 1 VIEW COMPARISON:  None. FINDINGS: There is a dual lead AICD. Moderate severity diffuse bilateral infiltrates are seen. There is a  small right pleural effusion. No pneumothorax is identified. The cardiac silhouette is markedly enlarged. Multiple radiopaque surgical clips are seen overlying the superior mediastinum. There is marked severity levoscoliosis of the thoracic spine with moderate severity multilevel degenerative changes. IMPRESSION: 1. Moderate severity diffuse bilateral infiltrates with a small right pleural effusion. 2. Marked enlargement of the cardiac silhouette. 3. Dual lead AICD in place. Electronically Signed   By: Virgina Norfolk M.D.   On: 07/22/2019 15:50   DG Chest Port 1V today  Result Date: 07/06/2019 CLINICAL DATA:  Dyspnea, COVID-19 positive EXAM: PORTABLE CHEST 1 VIEW COMPARISON:  07/03/2019 chest radiograph. FINDINGS: Stable configuration of 2 lead left subclavian pacemaker. Surgical clips overlie the central lower neck. Stable cardiomediastinal silhouette with mild cardiomegaly. No pneumothorax. No pleural effusion. Patchy hazy opacities throughout the mid to lower lungs bilaterally, similar to minimally improved. IMPRESSION: Patchy hazy opacities throughout the mid to lower lungs bilaterally, similar to minimally improved, compatible with COVID-19 pneumonia. Stable cardiomegaly. Electronically Signed   By: Ilona Sorrel M.D.   On: 07/06/2019 13:09   ECHOCARDIOGRAM COMPLETE  Result Date: 07/23/2019   ECHOCARDIOGRAM REPORT   Patient Name:   SWANZETTA STEPHEN        Date of Exam: 07/23/2019 Medical Rec #:  [id number]              Height:       58.0 in Accession #:    [id number]             Weight:       132.1 lb Date of Birth:  March 06, 1956             BSA:          1.53 m Patient Age:    73 years               BP:           96/52 mmHg Patient Gender: F                      HR:           72 bpm. Exam Location:  Inpatient [facility] Procedure: 2D Echo Indications:    Hypotension  History:        Patient has prior history of Echocardiogram examinations, most                 recent 03/25/2019. CHF, Pacemaker, Covis-19  Positive,                 Arrythmias:Atrial Fibrillation; Risk Factors:Diabetes,  Hypertension and Dyslipidemia.  Sonographer:    Mikki Santee RDCS (AE) Referring Phys: [id number] Shailynn Fong S Kameren Pargas IMPRESSIONS  1. Visually appears to have mild-moderate LVH, most prominent in septum. Overall normal LVEF on this study, improved from prior, with LV apical hypokinesis and prominent apical wall thickness.  2. Left ventricular ejection fraction, by visual estimation, is 55 to 60%. The left ventricle has normal function. There is moderately increased left ventricular hypertrophy.  3. Moderate hypokinesis of the left ventricular, apical apical segment.  4. Left ventricular diastolic parameters are indeterminate.  5. The left ventricle demonstrates regional wall motion abnormalities.  6. Global right ventricle has low normal systolic function.The right ventricular size is normal. No increase in right ventricular wall thickness.  7. Left atrial size was severely dilated.  8. Right atrial size was normal.  9. Small pericardial effusion. 10. Mild mitral annular calcification. 11. The mitral valve is normal in structure. Mild mitral valve regurgitation. 12. The tricuspid valve is normal in structure. 13. The tricuspid valve is normal in structure. Tricuspid valve regurgitation is trivial. 14. The aortic valve is tricuspid. Aortic valve regurgitation is trivial. 15. The pulmonic valve was grossly normal. Pulmonic valve regurgitation is trivial. 16. A pacer wire is visualized in the RA and RV. FINDINGS  Left Ventricle: Left ventricular ejection fraction, by visual estimation, is 55 to 60%. The left ventricle has normal function. Moderate hypokinesis of the left ventricular, apical apical segment. The left ventricle demonstrates regional wall motion abnormalities. There is moderately increased left ventricular hypertrophy. Asymmetric left ventricular hypertrophy of the basal-septal wall. Left ventricular diastolic  parameters are indeterminate. Right Ventricle: The right ventricular size is normal. No increase in right ventricular wall thickness. Global RV systolic function is has low normal systolic function. Left Atrium: Left atrial size was severely dilated. Right Atrium: Right atrial size was normal in size Pericardium: A small pericardial effusion is present. Mitral Valve: The mitral valve is normal in structure. Mild mitral annular calcification. Mild mitral valve regurgitation. Tricuspid Valve: The tricuspid valve is normal in structure. Tricuspid valve regurgitation is trivial. Aortic Valve: The aortic valve is tricuspid. Aortic valve regurgitation is trivial. Pulmonic Valve: The pulmonic valve was grossly normal. Pulmonic valve regurgitation is trivial. Pulmonic regurgitation is trivial. Aorta: The aortic root, ascending aorta and aortic arch are all structurally normal, with no evidence of dilitation or obstruction. Pulmonary Artery: The pulmonary artery is not well seen. IAS/Shunts: No atrial level shunt detected by color flow Doppler. Additional Comments: A pacer wire is visualized in the right atrium and right ventricle.  LEFT VENTRICLE PLAX 2D LVIDd:         4.21 cm  Diastology LVIDs:         2.91 cm  LV e' lateral: 5.22 cm/s LV PW:         1.10 cm  LV e' medial:  5.77 cm/s LV IVS:        1.72 cm LVOT diam:     1.90 cm LV SV:         47 ml LV SV Index:   29.34 LVOT Area:     2.84 cm  RIGHT VENTRICLE TAPSE (M-mode): 1.7 cm LEFT ATRIUM              Index       RIGHT ATRIUM           Index LA diam:        3.90 cm  2.55 cm/m  RA Area:  15.40 cm LA Vol (A2C):   99.6 ml  65.24 ml/m RA Volume:   35.70 ml  23.39 ml/m LA Vol (A4C):   96.8 ml  63.41 ml/m LA Biplane Vol: 106.0 ml 69.44 ml/m  AORTIC VALVE LVOT Vmax:   190.00 cm/s LVOT Vmean:  118.000 cm/s LVOT VTI:    0.356 m  AORTA Ao Root diam: 3.00 cm TRICUSPID VALVE TR Peak grad:   20.2 mmHg TR Vmax:        225.00 cm/s  SHUNTS Systemic VTI:  0.36 m Systemic  Diam: 1.90 cm  Buford Dresser MD Electronically signed by Buford Dresser MD Signature Date/Time: 07/23/2019/1:37:02 PM    Final    [place] EKG SITE RITE  Result Date: 07/23/2019 If Site Rite image not attached, placement could not be confirmed due to current cardiac rhythm.

## 2019-07-26 NOTE — Progress Notes (Signed)
ANTICOAGULATION CONSULT NOTE - Follow Up Consult  Pharmacy Consult for Lovenox Indication: atrial fibrillation  Allergies  Allergen Reactions  . Flecainide Nausea Only and Other (See Comments)    Faint feeling  . Hydrocodone-Acetaminophen Nausea Only and Other (See Comments)    Severe headache  . Ibuprofen Other (See Comments)    Kidney dysfunction  . Oxycodone Hcl Nausea Only and Other (See Comments)    Headache  . Penicillins Nausea Only and Other (See Comments)    Severe headache Has patient had a PCN reaction causing immediate rash, facial/tongue/throat swelling, SOB or lightheadedness with hypotension: No Has patient had a PCN reaction causing severe rash involving mucus membranes or skin necrosis: No Has patient had a PCN reaction that required hospitalization: No Has patient had a PCN reaction occurring within the last 10 years: No If all of the above answers are "NO", then may proceed with Cephalosporin use.     Patient Measurements: Height: 4\' 10"  (147.3 cm) Weight: 130 lb 15.3 oz (59.4 kg) IBW/kg (Calculated) : 40.9 Heparin Dosing Weight:   Vital Signs: Temp: 98.7 F (37.1 C) (01/30 0730) Temp Source: Axillary (01/30 0730) BP: 139/73 (01/30 1230) Pulse Rate: 72 (01/30 1230)  Labs: Recent Labs    07/24/19 0456 07/24/19 0456 07/25/19 0410 07/26/19 0505  HGB 11.5*   < > 11.5* 12.2  HCT 36.0  --  36.2 38.9  PLT 121*  --  89* 94*  APTT 38*  --   --   --   HEPARINUNFRC 1.12*  --   --   --   CREATININE 1.27*  --  0.97 1.15*   < > = values in this interval not displayed.    Estimated Creatinine Clearance: 38.2 mL/min (A) (by C-G formula based on SCr of 1.15 mg/dL (H)).   Medications:  Scheduled:  . vitamin C  500 mg Oral Daily  . chlorhexidine  15 mL Mouth Rinse BID  . Chlorhexidine Gluconate Cloth  6 each Topical Daily  . enoxaparin (LOVENOX) injection  1 mg/kg Subcutaneous Q12H  . fluticasone furoate-vilanterol  2 puff Inhalation Daily  .  hydrocortisone sod succinate (SOLU-CORTEF) inj  50 mg Intravenous Q12H  . insulin aspart  0-5 Units Subcutaneous QHS  . insulin aspart  0-9 Units Subcutaneous TID WC  . insulin aspart  3 Units Subcutaneous TID WC  . levothyroxine  50 mcg Intravenous Daily  . mouth rinse  15 mL Mouth Rinse q12n4p  . pantoprazole (PROTONIX) IV  40 mg Intravenous Q12H  . pravastatin  10 mg Oral Daily  . sodium chloride flush  10-40 mL Intracatheter Q12H  . zinc sulfate  220 mg Oral Daily   Infusions:  . sodium chloride Stopped (07/23/19 1729)  . sodium chloride 10 mL/hr at 07/25/19 1900  . sodium chloride    . ceFEPime (MAXIPIME) IV 2 g (07/26/19 1001)  . metronidazole 500 mg (07/26/19 0853)  . norepinephrine (LEVOPHED) Adult infusion Stopped (07/24/19 1443)  . vancomycin      Assessment: 67 yoF admitted on 1/6 with COVID-19 pneumonia and transferred to ICU on 1/27.  Due to inability to take oral medication, pharmacy is consulted to transition PTA Xarelto for Afib to Lovenox. Last xarelto dose on 1/28 at 10 am. SCr increased to 1.15, CrCl ~ 38 ml/min CBC: Hgb stable, Plt remains low/stable at 94k  Goal of Therapy:  Anti-Xa level 0.6-1 units/ml 4hrs after LMWH dose given Monitor platelets by anticoagulation protocol: Yes   Plan:  Lovenox 1  mg/kg (60mg ) [place] q12h. Follow up renal function, CBC daily. Follow up ability to take PO medications  Gretta Arab PharmD, BCPS Clinical pharmacist phone 7am- 5pm: [phone] 07/26/2019 12:37 PM

## 2019-07-26 NOTE — Plan of Care (Signed)
  Problem: Education: Goal: Knowledge of General Education information will improve Description: Including pain rating scale, medication(s)/side effects and non-pharmacologic comfort measures Outcome: Progressing   Problem: Clinical Measurements: Goal: Will remain free from infection Outcome: Progressing Goal: Cardiovascular complication will be avoided Outcome: Progressing   Problem: Coping: Goal: Level of anxiety will decrease Outcome: Progressing   Problem: Elimination: Goal: Will not experience complications related to bowel motility Outcome: Progressing Goal: Will not experience complications related to urinary retention Outcome: Progressing   Problem: Pain Managment: Goal: General experience of comfort will improve Outcome: Progressing   Problem: Safety: Goal: Ability to remain free from injury will improve Outcome: Progressing   Problem: Skin Integrity: Goal: Risk for impaired skin integrity will decrease Outcome: Progressing   Problem: Education: Goal: Knowledge of risk factors and measures for prevention of condition will improve Outcome: Progressing   Problem: Coping: Goal: Psychosocial and spiritual needs will be supported Outcome: Progressing   Problem: Respiratory: Goal: Will maintain a patent airway Outcome: Progressing Goal: Complications related to the disease process, condition or treatment will be avoided or minimized Outcome: Progressing   Problem: Health Behavior/Discharge Planning: Goal: Ability to manage health-related needs will improve Outcome: Not Progressing   Problem: Clinical Measurements: Goal: Ability to maintain clinical measurements within normal limits will improve Outcome: Not Progressing Goal: Diagnostic test results will improve Outcome: Not Progressing Goal: Respiratory complications will improve Outcome: Not Progressing   Problem: Activity: Goal: Risk for activity intolerance will decrease Outcome: Not Progressing    Problem: Nutrition: Goal: Adequate nutrition will be maintained Outcome: Not Progressing

## 2019-07-26 NOTE — Progress Notes (Signed)
Attempted to call pt's brother for nursing update at this time. No answer. Left voicemail stating there was no change in pt's status, but was calling to update family at this time.

## 2019-07-26 NOTE — Plan of Care (Signed)
Remained on HHFNC with NRB Mask in place throughout shift.  Pt did once pulled it off and dropped to mid to low 80% within 1 minutes.  Pt restless at that time, showing more dyspnea.  Pt medicated approximately every 3 hrs for dyspnea throughout shift.  Morphine assist in descreasing Dyspnea and air hunger.  Lung remain diminished with coarseness and occasional exp. Wheezing.  Pt alert to name and place but questioning time/situation.  Pt also had attempted one time during night to get OOB without nurse or oxygen in place.  Sats dropped into 70s and recovered slowly.  Took pt approximately 15 minutes to reach acceptable range for sats while on HHFNC and NRB Mask.  Pt does had congested cough but not productive.  Pt has increase WOB at times with Dyspnea prior to being medicated with Morphine.  Problem: Health Behavior/Discharge Planning: Goal: Ability to manage health-related needs will improve Outcome: Not Progressing   Problem: Clinical Measurements: Goal: Respiratory complications will improve Outcome: Not Progressing Goal: Cardiovascular complication will be avoided Outcome: Not Progressing   Problem: Activity: Goal: Risk for activity intolerance will decrease Outcome: Not Progressing   Problem: Nutrition: Goal: Adequate nutrition will be maintained Outcome: Not Progressing   Problem: Coping: Goal: Level of anxiety will decrease Outcome: Not Progressing   Problem: Elimination: Goal: Will not experience complications related to urinary retention Outcome: Not Progressing

## 2019-07-27 ENCOUNTER — Inpatient Hospital Stay (HOSPITAL_COMMUNITY): Payer: PPO

## 2019-07-27 LAB — COMPREHENSIVE METABOLIC PANEL
ALT: 28 U/L (ref 0–44)
AST: 26 U/L (ref 15–41)
Albumin: 2.2 g/dL — ABNORMAL LOW (ref 3.5–5.0)
Alkaline Phosphatase: 142 U/L — ABNORMAL HIGH (ref 38–126)
Anion gap: 14 (ref 5–15)
BUN: 53 mg/dL — ABNORMAL HIGH (ref 8–23)
CO2: 22 mmol/L (ref 22–32)
Calcium: 8.9 mg/dL (ref 8.9–10.3)
Chloride: 113 mmol/L — ABNORMAL HIGH (ref 98–111)
Creatinine, Ser: 1.46 mg/dL — ABNORMAL HIGH (ref 0.44–1.00)
GFR calc Af Amer: 44 mL/min — ABNORMAL LOW (ref >60)
GFR calc non Af Amer: 38 mL/min — ABNORMAL LOW (ref >60)
Glucose, Bld: 150 mg/dL — ABNORMAL HIGH (ref 70–99)
Potassium: 4.1 mmol/L (ref 3.5–5.1)
Sodium: 149 mmol/L — ABNORMAL HIGH (ref 135–145)
Total Bilirubin: 1.4 mg/dL — ABNORMAL HIGH (ref 0.3–1.2)
Total Protein: 6.1 g/dL — ABNORMAL LOW (ref 6.5–8.1)

## 2019-07-27 LAB — CBC WITH DIFFERENTIAL/PLATELET
Abs Immature Granulocytes: 0.62 10*3/uL — ABNORMAL HIGH (ref 0.00–0.07)
Basophils Absolute: 0 10*3/uL (ref 0.0–0.1)
Basophils Relative: 0 %
Eosinophils Absolute: 0 10*3/uL (ref 0.0–0.5)
Eosinophils Relative: 0 %
HCT: 42 % (ref 36.0–46.0)
Hemoglobin: 12.6 g/dL (ref 12.0–15.0)
Immature Granulocytes: 6 %
Lymphocytes Relative: 5 %
Lymphs Abs: 0.5 10*3/uL — ABNORMAL LOW (ref 0.7–4.0)
MCH: 31.1 pg (ref 26.0–34.0)
MCHC: 30 g/dL (ref 30.0–36.0)
MCV: 103.7 fL — ABNORMAL HIGH (ref 80.0–100.0)
Monocytes Absolute: 0.2 10*3/uL (ref 0.1–1.0)
Monocytes Relative: 2 %
Neutro Abs: 8.8 10*3/uL — ABNORMAL HIGH (ref 1.7–7.7)
Neutrophils Relative %: 87 %
Platelets: 119 10*3/uL — ABNORMAL LOW (ref 150–400)
RBC: 4.05 MIL/uL (ref 3.87–5.11)
RDW: 15.2 % (ref 11.5–15.5)
WBC: 10.1 10*3/uL (ref 4.0–10.5)
nRBC: 1.3 % — ABNORMAL HIGH (ref 0.0–0.2)

## 2019-07-27 LAB — CULTURE, BLOOD (ROUTINE X 2)

## 2019-07-27 LAB — GLUCOSE, CAPILLARY
Glucose-Capillary: 131 mg/dL — ABNORMAL HIGH (ref 70–99)
Glucose-Capillary: 132 mg/dL — ABNORMAL HIGH (ref 70–99)
Glucose-Capillary: 48 mg/dL — ABNORMAL LOW (ref 70–99)
Glucose-Capillary: 152 mg/dL — ABNORMAL HIGH (ref 70–99)
Glucose-Capillary: 122 mg/dL — ABNORMAL HIGH (ref 70–99)

## 2019-07-27 LAB — C-REACTIVE PROTEIN: CRP: 27.5 mg/dL — ABNORMAL HIGH (ref <1.0)

## 2019-07-27 LAB — D-DIMER, QUANTITATIVE: D-Dimer, Quant: 1.49 ug/mL-FEU — ABNORMAL HIGH (ref 0.00–0.50)

## 2019-07-27 LAB — PROCALCITONIN: Procalcitonin: 6.54 ng/mL

## 2019-07-27 MED ORDER — DEXTROSE 50 % IV SOLN: 25.0000 g | INTRAVENOUS | Status: DC | PRN

## 2019-07-27 MED ORDER — DEXTROSE 5 % IV SOLN: INTRAVENOUS | Status: DC

## 2019-07-27 MED ORDER — DEXTROSE 50 % IV SOLN
INTRAVENOUS | Status: AC
  Administered 2019-07-27: 25 g via INTRAVENOUS
  Filled 2019-07-27: qty 50

## 2019-07-27 MED ORDER — DEXTROSE 10 % IV SOLN: INTRAVENOUS | Status: DC

## 2019-07-27 MED ORDER — MORPHINE SULFATE (PF) 2 MG/ML IV SOLN
1.0000 mg | INTRAVENOUS | Status: DC | PRN
  Administered 2019-07-27 – 2019-07-28 (×5): 2 mg via INTRAVENOUS
  Filled 2019-07-27 (×6): qty 1

## 2019-07-27 NOTE — Progress Notes (Signed)
Pharmacy Antibiotic Note  [name] is a 64 y.o. female admitted on 07/13/2019 with COVID-19 pneumonia.  She has previously completed courses of antibiotics (Cefepime/Vanc, Unasyn > Augmentin) for suspected bacterial pneumonia.  She was narrowed to Cefepime alone, but was restarted on Vanc and metronidazole d/t suspected HCAP with worsening CXR. SCr up to 1.46, WBC decreased to 10.1, she remains Afebrile.  Pressors have been d/c.  MRSA PCR is negative.  Plan: D/C vanc today per MD. Continue Metronidazole 500 mg IV q8h per MD.  Continue Cefepime 2g IV q12h Follow up renal function, culture results, and clinical course.   Height: 4\' 10"  (147.3 cm) Weight: 130 lb 15.3 oz (59.4 kg) IBW/kg (Calculated) : 40.9  Temp (24hrs), Avg:97.5 F (36.4 C), Min:97 F (36.1 C), Max:98 F (36.7 C)  Recent Labs  Lab 07/23/19 0303 07/24/19 0456 07/25/19 0410 07/26/19 0505 07/27/19 0405  WBC 20.2* 21.4* 11.7* 8.9 10.1  CREATININE 1.88* 1.27* 0.97 1.15* 1.46*    Estimated Creatinine Clearance: 30.1 mL/min (A) (by C-G formula based on SCr of 1.46 mg/dL (H)).    Allergies  Allergen Reactions  . Flecainide Nausea Only and Other (See Comments)    Faint feeling  . Hydrocodone-Acetaminophen Nausea Only and Other (See Comments)    Severe headache  . Ibuprofen Other (See Comments)    Kidney dysfunction  . Oxycodone Hcl Nausea Only and Other (See Comments)    Headache  . Penicillins Nausea Only and Other (See Comments)    Severe headache Has patient had a PCN reaction causing immediate rash, facial/tongue/throat swelling, SOB or lightheadedness with hypotension: No Has patient had a PCN reaction causing severe rash involving mucus membranes or skin necrosis: No Has patient had a PCN reaction that required hospitalization: No Has patient had a PCN reaction occurring within the last 10 years: No If all of the above answers are "NO", then may proceed with Cephalosporin use.     Antimicrobials  this admission: 1/6 Remdesivir >> 1/10 1/6 Vancomycin >> 1/8, resumed 1/27 >> 1/28, resumed 1/29 >> 1/31 1/6 Cefepime >> 1/12, resumed 1/27 >>   1/14 Unasyn >> 1/19 1/19 Augmentin >> 1/21 1/27 metronidazole >> 1/28, resumed 1/29 >>   Microbiology results: 12/31 PTA COVID - positive 1/6 BCx - negative 1/27 MRSA PCR: neg 1/27 BCx: GNR 1/28 UCx:   Thank you for allowing pharmacy to be a part of this patient's care.  Gretta Arab PharmD, BCPS Clinical pharmacist phone 7am- 5pm: [phone] 07/27/2019 1:14 PM

## 2019-07-27 NOTE — Progress Notes (Signed)
Called patient's brother at 61.  He did not answer.  Left a message with number to call back if he would like an updated on the patient.

## 2019-07-27 NOTE — Plan of Care (Signed)
  Problem: Education: Goal: Knowledge of General Education information will improve Description: Including pain rating scale, medication(s)/side effects and non-pharmacologic comfort measures Outcome: Progressing   Problem: Health Behavior/Discharge Planning: Goal: Ability to manage health-related needs will improve Outcome: Progressing   Problem: Clinical Measurements: Goal: Ability to maintain clinical measurements within normal limits will improve Outcome: Progressing Goal: Will remain free from infection Outcome: Progressing Goal: Diagnostic test results will improve Outcome: Progressing Goal: Cardiovascular complication will be avoided Outcome: Progressing   Problem: Nutrition: Goal: Adequate nutrition will be maintained Outcome: Progressing   Problem: Coping: Goal: Level of anxiety will decrease Outcome: Progressing   Problem: Elimination: Goal: Will not experience complications related to bowel motility Outcome: Progressing Goal: Will not experience complications related to urinary retention Outcome: Progressing   Problem: Pain Managment: Goal: General experience of comfort will improve Outcome: Progressing   Problem: Safety: Goal: Ability to remain free from injury will improve Outcome: Progressing   Problem: Skin Integrity: Goal: Risk for impaired skin integrity will decrease Outcome: Progressing   Problem: Education: Goal: Knowledge of risk factors and measures for prevention of condition will improve Outcome: Progressing   Problem: Coping: Goal: Psychosocial and spiritual needs will be supported Outcome: Progressing   Problem: Respiratory: Goal: Will maintain a patent airway Outcome: Progressing Goal: Complications related to the disease process, condition or treatment will be avoided or minimized Outcome: Progressing

## 2019-07-27 NOTE — Progress Notes (Signed)
Report called to Geroge Baseman Rn

## 2019-07-27 NOTE — Progress Notes (Signed)
PROGRESS NOTE                                                                                                                                                                                                             Patient Demographics:    [name], is a 64 y.o. female, DOB - 02-Jun-1956, DH:[id number]  Outpatient Primary MD for the patient is Celene Squibb, MD   Admit date - 07/12/2019   LOS - 25  Chief Complaint  Patient presents with  . Shortness of Breath  . covid pos       Brief Narrative:  Patient is a 64 y.o. 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    Post admission-she had significant worsening of her hypoxemia-requiring 100% NRB-she was then transferred to [facility].  Hypoxia gradually improved with steroids/remdesivir and empiric antibiotics-however on 1/12-1/13-hypoxia briefly worsened-chest x-ray showed a new left-sided infiltrate.  Hospital course was also complicated by development of AKI and hypernatremia.  Patient was provided supportive care with IV fluids, and was restarted on IV antibiotics (Unasyn)-with further improvement of hypoxemia.  -1/27patient hypotensive, septic, transferred to ICU, requiring pressor support.  She required Levophed for 24 hours, CVP initially low, improved with hydration, patient with increased oxygen requirement, chest x-ray showing worsening bilateral opacity.  COVID-19 Medications:  Steroids: 1/6>> 1/15 Remdesivir: 1/6>>1/10 Convalescent Plasma: 1/7    Antibiotics: Unasyn: 1/14>> Cefepime: 1/6>>1/12 Vancomycin: 1/6>>1/8   Subjective:   Patient in bed, and reports increased shortness of breath, as discussed with staff, patient with significantly increased work of breathing, with some discomfort, but has been getting some degree of comfort with her morphine .   Assessment  & Plan :   Septic shock/UTI/Enterobacter Cloace  bacteremia/HCAP -Transferred to ICU 1/28, requiring pressor support, initially on Levophed, as well with low CVP required fluid resuscitation. -Blood pressure has stabilized over last 24 hours, no pressor requirement, CVP within normal range as well . -DC stress dose steroids . -Follow on final sensitivity on blood cultures . -  Recommend broad-spectrum antibiotic, MRSA PCR is negative, will stop IV vancomycin, continue with cefepime and Flagyl . -Chest x-ray showing worsening bilateral opacity, and she had increased work of breathing, and oxygen requirement, she received 1 dose of Lasix as well. -Remains significantly altered, unsafe to swallow, so her p.o. medication has been switched to IV regimen. -No role for IV Lasix currently, as CVP has normalized, actually she will be started on IV fluids.   Acute Hypoxic Resp Failure due to Covid 19 Viral pneumonia  - with  concurrent bacterial pneumonia with underlying amiodarone induced lung injury. -Patient had fluctuating oxygen requirement during hospital stay  -Chest x-ray this morning showing worsening COVID-19 for pneumonia . -Remains with significant oxygen requirement, she is requiring 50 L / 100% heated high flow nasal cannula and NRB, this is most likely in the setting of HCAP and Covid pneumonia.  With underlining lung disease due to amiodarone induced lung injury. -Improvement with Lasix, CVP has normalized, continue to hold diuresis, starting on gentle hydration    O2 requirements:  SpO2: 92 % O2 Flow Rate (L/min): 50 L/min FiO2 (%): 100 %   COVID-19  Labs: Recent Labs    07/25/19 0410 07/26/19 0505 07/27/19 0405  DDIMER 0.72* 1.44* 1.49*  CRP 8.6* 22.7* 27.5*       Component Value Date/Time   BNP 275.4 (H) 07/19/2019 0115    Recent Labs  Lab 07/24/19 0456 07/25/19 0410 07/26/19 0505 07/27/19 0405  PROCALCITON 36.12 20.45 10.43 6.54    Lab Results  Component Value Date   SARSCOV2NAA Detected (A) 06/26/2019   [place] NEGATIVE 02/20/2019   [place] Not Detected 12/18/2018    AKI on CKD stage IIIa:  -Creatinine has been stable, continue to monitor closely.  Acute metabolic encephalopathy:  -Initially altered, this has improved, this morning she appears to be mildly encephalopathic, even though she is awake alert x3 she is easily distracted,  HTN:  -Metoprolol has been stopped giving low blood pressure  Persistent atrial fibrillation s/p AV node ablation and PPM insertion:  -Hold metoprolol given hypotension, will change Xarelto to heparin GTT given worsening renal function . -has history of amiodarone induced lung toxicity  Chronic combined systolic and diastolic heart failure percent on echocardiogram in 02/2019:  -Repeat 2D echo this morning showing improved EF to 50%.  Bronchial asthma: Appears stable-no rhonchi-continue bronchodilators.  DM-2 (A1c 6.8): Hypoglycemic episode due to tapering of steroids, now her CBG uncontrolled, she is back on sliding scale, and 3 units before meals  CBG (last 3)  Recent Labs    07/26/19 1556 07/27/19 0743 07/27/19 1117  GLUCAP 140* 131* 132*   Hypothyroidism: Continue levothyroxine  History of amiodarone induced lung toxicity/ILD: Supportive care.  Remote history of ASD repair - no acute issues.  Deconditioning/debility: Continue PT OT and speech therapy, patient and family are planning home with home health.  Hyperkalemia.  Kayexalate and monitor.  Hypernatremia -Start on D5W  Goals of care: -Have discussed with brother, patient is extremely frail,  with extremely guarded prognosis, with severe sepsis, as well as severe respiratory failure, for now he wants to continue with current measures, full scope of medical treatment, discussed IV morphine with him given her significant dyspnea and increased work of breathing, getting significant relief with her dyspnea with as needed morphine, will increase dose to 1 to 2 mg every 3 hours as needed for her dyspnea, brother agreeable with plan.  Prognosis: Extremely guarded  Consults  :  PCCM  Procedures  :  None  Condition -critical  Family Communication  : Brother updated daily  Code Status :  DNR  Diet :  Diet Order            DIET DYS 3 Room service appropriate? Yes; Fluid consistency: Thin  Diet effective now               Disposition Plan  : Health PT once oxygenation improves further.    Antimicorbials  :    Anti-infectives (From admission, onward)   Start     Dose/Rate Route Frequency Ordered Stop   07/26/19 1600  vancomycin (VANCOREADY) IVPB 750 mg/150 mL     750 mg 150 mL/hr over 60 Minutes Intravenous Every 24 hours 07/25/19 1544     07/25/19 1600  metroNIDAZOLE (FLAGYL) IVPB 500 mg     500 mg 100 mL/hr over 60 Minutes Intravenous Every 8 hours 07/25/19 1524     07/25/19 1600  vancomycin (VANCOCIN) IVPB 1000 mg/200 mL premix     1,000 mg 200 mL/hr over 60 Minutes Intravenous  Once 07/25/19 1544 07/25/19 1807   07/24/19 1030  ceFEPIme (MAXIPIME) 2 g in sodium chloride 0.9 % 100 mL IVPB     2 g 200 mL/hr over 30 Minutes Intravenous Every 12 hours 07/24/19 1012     07/23/19 1600  vancomycin (VANCOCIN) IVPB 1000 mg/200 mL premix     1,000 mg 200 mL/hr over 60 Minutes Intravenous  Once 07/23/19 1517 07/23/19 1827   07/23/19 1534  vancomycin variable dose per unstable renal function (pharmacist dosing)  Status:  Discontinued      Does not apply See admin instructions 07/23/19 1534 07/24/19 1032   07/23/19 1530  ceFEPIme (MAXIPIME) 2 g in sodium chloride 0.9 % 100 mL  IVPB  Status:  Discontinued     2 g 200 mL/hr over 30 Minutes Intravenous Every 24 hours 07/23/19 1517 07/24/19 1012   07/23/19 1500  metroNIDAZOLE (FLAGYL) IVPB 500 mg  Status:  Discontinued     500 mg 100 mL/hr over 60 Minutes Intravenous Every 8 hours 07/23/19 1458 07/24/19 1032   07/15/19 1000  amoxicillin-clavulanate (AUGMENTIN) 500-125 MG per tablet 500 mg    Note to Pharmacy: Pharmacy can adjust for aspiration pneumonia, stop date 07/17/2019   1 tablet Oral 2 times daily 07/15/19 0754 07/17/19 2100   07/10/19 1000  ampicillin-sulbactam (UNASYN) 1.5 g in sodium chloride 0.9 % 100 mL IVPB  Status:  Discontinued     1.5 g 200 mL/hr over 30 Minutes Intravenous Every 8 hours 07/10/19 0945 07/15/19 0754   07/04/19 1830  vancomycin (VANCOREADY) IVPB 750 mg/150 mL  Status:  Discontinued     750 mg 150 mL/hr over 60 Minutes Intravenous Every 48 hours 07/03/19 1249 07/04/19 1110   07/04/19 1800  vancomycin (VANCOCIN) IVPB 1000 mg/200 mL premix  Status:  Discontinued     1,000 mg 200 mL/hr over 60 Minutes Intravenous Every 48 hours 07/21/2019 1727 07/03/19 1249   07/03/19 1800  ceFEPIme (MAXIPIME) 1 g in sodium chloride 0.9 % 100 mL IVPB  Status:  Discontinued     1 g 200 mL/hr over 30 Minutes Intravenous Every 24 hours 07/01/2019 1725 07/03/19 1248   07/03/19 1800  ceFEPIme (MAXIPIME) 2 g in sodium chloride 0.9 % 100 mL IVPB     2 g 200 mL/hr over 30 Minutes Intravenous Every 24 hours 07/03/19 1247 07/08/19 1850   07/03/19 1000  remdesivir 100 mg in sodium chloride 0.9 % 100 mL  IVPB     100 mg 200 mL/hr over 30 Minutes Intravenous Daily 07/16/2019 1723 07/06/19 0926   07/03/19 1000  remdesivir 100 mg in sodium chloride 0.9 % 100 mL IVPB  Status:  Discontinued     100 mg 200 mL/hr over 30 Minutes Intravenous Daily 07/25/2019 2355 07/03/19 0257   07/17/2019 2354  remdesivir 200 mg in sodium chloride 0.9% 250 mL IVPB  Status:  Discontinued     200 mg 580 mL/hr over 30 Minutes Intravenous Once 07/26/2019  2355 07/03/19 0257   07/05/2019 1800  remdesivir 200 mg in sodium chloride 0.9% 250 mL IVPB     200 mg 580 mL/hr over 30 Minutes Intravenous Once 07/19/2019 1723 07/27/2019 2306   07/11/2019 1730  vancomycin (VANCOCIN) IVPB 1000 mg/200 mL premix  Status:  Discontinued     1,000 mg 200 mL/hr over 60 Minutes Intravenous  Once 07/14/2019 1722 07/21/2019 1725   07/20/2019 1730  ceFEPIme (MAXIPIME) 2 g in sodium chloride 0.9 % 100 mL IVPB     2 g 200 mL/hr over 30 Minutes Intravenous  Once 06/28/2019 1722 07/04/2019 1918   06/28/2019 1730  vancomycin (VANCOREADY) IVPB 1500 mg/300 mL     1,500 mg 150 mL/hr over 120 Minutes Intravenous  Once 07/13/2019 1725 07/04/2019 2036      DVT Prophylaxis  : Xarelto  Inpatient Medications  Scheduled Meds: . vitamin C  500 mg Oral Daily  . chlorhexidine  15 mL Mouth Rinse BID  . Chlorhexidine Gluconate Cloth  6 each Topical Daily  . enoxaparin (LOVENOX) injection  1 mg/kg Subcutaneous Q12H  . fluticasone furoate-vilanterol  2 puff Inhalation Daily  . hydrocortisone sod succinate (SOLU-CORTEF) inj  50 mg Intravenous Q12H  . insulin aspart  0-5 Units Subcutaneous QHS  . insulin aspart  0-9 Units Subcutaneous TID WC  . insulin aspart  3 Units Subcutaneous TID WC  . levothyroxine  50 mcg Intravenous Daily  . mouth rinse  15 mL Mouth Rinse q12n4p  . pantoprazole (PROTONIX) IV  40 mg Intravenous Q12H  . pravastatin  10 mg Oral Daily  . sodium chloride flush  10-40 mL Intracatheter Q12H  . zinc sulfate  220 mg Oral Daily   Continuous Infusions: . sodium chloride Stopped (07/23/19 1729)  . sodium chloride 10 mL/hr at 07/25/19 1900  . sodium chloride    . ceFEPime (MAXIPIME) IV 2 g (07/27/19 [id number])  . dextrose 30 mL/hr at 07/27/19 0943  . metronidazole 500 mg (07/27/19 0806)  . norepinephrine (LEVOPHED) Adult infusion Stopped (07/24/19 1443)  . vancomycin 750 mg (07/26/19 1848)   PRN Meds:.sodium chloride, acetaminophen, albuterol, calcium carbonate,  chlorpheniramine-HYDROcodone, guaiFENesin-dextromethorphan, lip balm, Melatonin, morphine injection, ondansetron (ZOFRAN) IV, senna-docusate, sodium chloride flush   See all Orders from today for further details   Phillips Climes M.D on 07/27/2019 at 11:38 AM  To page go to [email or web address] - use universal password  [facility] -  Office  [phone]    Objective:   Vitals:   07/27/19 0600 07/27/19 0700 07/27/19 0800 07/27/19 0858  BP: 112/75 126/77    Pulse: 73 75    Resp: (!) 23 (!) 23    Temp:   (!) 97 F (36.1 C)   TempSrc:   Oral   SpO2: 98% 92%  92%  Weight:      Height:        Wt Readings from Last 3 Encounters:  07/23/19 59.4 kg  04/01/19 65.8 kg  04/01/19 64.4 kg  Intake/Output Summary (Last 24 hours) at 07/27/2019 1138 Last data filed at 07/27/2019 0700 Gross per 24 hour  Intake 499.94 ml  Output 100 ml  Net 399.94 ml     Physical Exam  Is more awake today, patient is more lethargic today, but wakes up answering yes/no questions, but remains confused, Symmetrical Chest wall movement, tachypneic, with increased work of breathing, coarse bilateral respiratory sounds RRR,No Gallops,Rubs or new Murmurs, No Parasternal Heave +ve B.Sounds, Abd Soft, No tenderness, No rebound - guarding or rigidity. No Cyanosis, Clubbing or edema, No new Rash or bruise      Data Review:    CBC Recent Labs  Lab 07/23/19 0303 07/24/19 0456 07/25/19 0410 07/26/19 0505 07/27/19 0405  WBC 20.2* 21.4* 11.7* 8.9 10.1  HGB 14.9 11.5* 11.5* 12.2 12.6  HCT 46.1* 36.0 36.2 38.9 42.0  PLT PLATELET CLUMPS NOTED ON SMEAR, COUNT APPEARS ADEQUATE 121* 89* 94* 119*  MCV 96.8 97.6 99.5 100.5* 103.7*  MCH 31.3 31.2 31.6 31.5 31.1  MCHC 32.3 31.9 31.8 31.4 30.0  RDW 14.0 14.0 14.2 14.6 15.2  LYMPHSABS 0.4* 0.3* 0.6* 0.5* 0.5*  MONOABS 0.1 0.9 0.8 0.4 0.2  EOSABS 0.0 0.0 0.1 0.1 0.0  BASOSABS 0.0 0.0 0.0 0.1 0.0    Chemistries  Recent Labs  Lab 07/21/19 0228  07/21/19 0228 07/22/19 0246 07/22/19 0246 07/23/19 0303 07/24/19 0456 07/25/19 0410 07/26/19 0505 07/27/19 0405  NA 137   < > 134*   < > 138 139 143 144 149*  K 4.7   < > 6.0*   < > 3.5 3.2* 4.5 3.9 4.1  CL 92*   < > 91*   < > 91* 102 111 112* 113*  CO2 31   < > 31   < > 27 27 24 24 22   GLUCOSE 36*   < > 34*   < > <20* 244* 71 163* 150*  BUN 65*   < > 76*   < > 77* 59* 46* 44* 53*  CREATININE 1.25*   < > 1.47*   < > 1.88* 1.27* 0.97 1.15* 1.46*  CALCIUM 9.3   < > 8.9   < > 8.5* 7.9* 8.3* 8.3* 8.9  MG 2.1  --  2.2  --  1.7 2.0  --   --   --   AST 25   < > 36   < > 54* 23 29 27 26   ALT 51*   < > 50*   < > 72* 43 39 29 28  ALKPHOS 204*   < > 187*   < > 212* 154* 147* 138* 142*  BILITOT 1.0   < > 1.0   < > 1.3* 0.9 1.0 1.1 1.4*   < > = values in this interval not displayed.   ------------------------------------------------------------------------------------------------------------------ No results for input(s): CHOL, HDL, LDLCALC, TRIG, CHOLHDL, LDLDIRECT in the last 72 hours.  Lab Results  Component Value Date   HGBA1C 6.8 (H) 07/06/2019   ------------------------------------------------------------------------------------------------------------------ No results for input(s): TSH, T4TOTAL, T3FREE, THYROIDAB in the last 72 hours.  Invalid input(s): FREET3 ------------------------------------------------------------------------------------------------------------------ No results for input(s): VITAMINB12, FOLATE, FERRITIN, TIBC, IRON, RETICCTPCT in the last 72 hours.  Coagulation profile No results for input(s): INR, PROTIME in the last 168 hours.  Recent Labs    07/26/19 0505 07/27/19 0405  DDIMER 1.44* 1.49*    Cardiac Enzymes No results for input(s): CKMB, TROPONINI, MYOGLOBIN in the last 168 hours.  Invalid input(s): CK ------------------------------------------------------------------------------------------------------------------    Component  Value Date/Time    BNP 275.4 (H) 07/19/2019 0115    Micro Results Recent Results (from the past 240 hour(s))  MRSA PCR Screening     Status: None   Collection Time: 07/23/19 10:23 AM   Specimen: Nasopharyngeal  Result Value Ref Range Status   MRSA by PCR NEGATIVE NEGATIVE Final    Comment:        The GeneXpert MRSA Assay (FDA approved for NASAL specimens only), is one component of a comprehensive MRSA colonization surveillance program. It is not intended to diagnose MRSA infection nor to guide or monitor treatment for MRSA infections. Performed at [facility], [place] [address], [place], [place] [address]   Culture, blood (routine x 2)     Status: Abnormal (Preliminary result)   Collection Time: 07/23/19 10:03 PM   Specimen: BLOOD LEFT HAND  Result Value Ref Range Status   Specimen Description   Final    BLOOD LEFT HAND Performed at [place] [address], [place], [place] [address]    Special Requests   Final    BOTTLES DRAWN AEROBIC ONLY Blood Culture results may not be optimal due to an inadequate volume of blood received in culture bottles Performed at [place] [address], [place], [place] [address]    Culture  Setup Time   Final    GRAM NEGATIVE RODS AEROBIC BOTTLE ONLY CRITICAL RESULT CALLED TO, READ BACK BY AND VERIFIED WITH: Starlyn Skeans PharmD 16:45 07/25/19 ([email or web address])    Culture (A)  Final    ENTEROBACTER CLOACAE REPEATING SUSCEPTIBILITIES Performed at [facility], [place] [address] [place], [place] [address]    Report Status PENDING  Incomplete  Blood Culture ID Panel (Reflexed)     Status: Abnormal   Collection Time: 07/23/19 10:03 PM  Result Value Ref Range Status   Enterococcus species NOT DETECTED NOT DETECTED Final   Listeria monocytogenes NOT DETECTED NOT DETECTED Final   Staphylococcus species NOT DETECTED NOT DETECTED Final   Staphylococcus aureus (BCID) NOT DETECTED NOT DETECTED Final    Streptococcus species NOT DETECTED NOT DETECTED Final   Streptococcus agalactiae NOT DETECTED NOT DETECTED Final   Streptococcus pneumoniae NOT DETECTED NOT DETECTED Final   Streptococcus pyogenes NOT DETECTED NOT DETECTED Final   Acinetobacter baumannii NOT DETECTED NOT DETECTED Final   Enterobacteriaceae species DETECTED (A) NOT DETECTED Final    Comment: Enterobacteriaceae represent a large family of gram-negative bacteria, not a single organism. CRITICAL RESULT CALLED TO, READ BACK BY AND VERIFIED WITH: Starlyn Skeans PharmD 16:45 07/25/19 ([email or web address])    Enterobacter cloacae complex DETECTED (A) NOT DETECTED Final    Comment: CRITICAL RESULT CALLED TO, READ BACK BY AND VERIFIED WITH: Starlyn Skeans PharmD 16:45 07/25/19 ([email or web address])    Escherichia coli NOT DETECTED NOT DETECTED Final   Klebsiella oxytoca NOT DETECTED NOT DETECTED Final   Klebsiella pneumoniae NOT DETECTED NOT DETECTED Final   Proteus species NOT DETECTED NOT DETECTED Final   Serratia marcescens NOT DETECTED NOT DETECTED Final   Carbapenem resistance NOT DETECTED NOT DETECTED Final   Haemophilus influenzae NOT DETECTED NOT DETECTED Final   Neisseria meningitidis NOT DETECTED NOT DETECTED Final   Pseudomonas aeruginosa NOT DETECTED NOT DETECTED Final   Candida albicans NOT DETECTED NOT DETECTED Final   Candida glabrata NOT DETECTED NOT DETECTED Final   Candida krusei NOT DETECTED NOT DETECTED Final   Candida parapsilosis NOT DETECTED NOT DETECTED Final   Candida tropicalis NOT DETECTED NOT DETECTED  Final    Comment: Performed at [facility], [place] [address] [place], [place] [address]  Culture, blood (routine x 2)     Status: None (Preliminary result)   Collection Time: 07/23/19 10:08 PM   Specimen: BLOOD RIGHT HAND  Result Value Ref Range Status   Specimen Description   Final    BLOOD RIGHT HAND Performed at [place] [facility] [address], [place], [place] [address]    Special Requests   Final    BOTTLES  DRAWN AEROBIC ONLY Blood Culture results may not be optimal due to an inadequate volume of blood received in culture bottles Performed at [facility] [address], [place], [place] [address]    Culture  Setup Time   Final    GRAM NEGATIVE RODS BOTTLES DRAWN AEROBIC ONLY CRITICAL VALUE NOTED.  VALUE IS CONSISTENT WITH PREVIOUSLY REPORTED AND CALLED VALUE. Performed at [facility], [place] [address], [place], [place] [address]    Culture GRAM NEGATIVE RODS  Final   Report Status PENDING  Incomplete    Radiology Reports DG CHEST PORT 1 VIEW  Result Date: 07/27/2019 CLINICAL DATA:  Follow-up coronavirus pneumonia. EXAM: PORTABLE CHEST 1 VIEW COMPARISON:  07/25/2019 FINDINGS: Right internal jugular central line tip unchanged in the SVC above the right atrium. Pacemaker as seen previously. Widespread bilateral pulmonary infiltrates appear the same. No evidence of new consolidation, collapse or effusion. IMPRESSION: No change.  Widespread bilateral pulmonary infiltrates. Electronically Signed   By: Nelson Chimes M.D.   On: 07/27/2019 07:32   DG Chest Port 1 View  Result Date: 07/25/2019 CLINICAL DATA:  Hypoxia EXAM: PORTABLE CHEST 1 VIEW COMPARISON:  Two days ago FINDINGS: Cardiomegaly. Biventricular pacer from the left. Diffuse pulmonary opacities are progressed. Right IJ line with tip at the upper cavoatrial junction. No evident effusion or pneumothorax IMPRESSION: Worsening bilateral pneumonia. Electronically Signed   By: Monte Fantasia M.D.   On: 07/25/2019 11:28   DG CHEST PORT 1 VIEW  Result Date: 07/23/2019 CLINICAL DATA:  Central line placement. EXAM: PORTABLE CHEST 1 VIEW COMPARISON:  Chest x-ray from same day at 0653 hours. FINDINGS: New right internal jugular central venous catheter with tip in the distal SVC. Unchanged left chest wall pacemaker. Stable cardiomegaly. Patchy airspace disease throughout both lungs has mildly worsened. No pleural effusion or  pneumothorax. No acute osseous abnormality. IMPRESSION: 1. New right internal jugular central venous catheter without complicating feature. 2. Worsened multifocal pneumonia. Electronically Signed   By: Titus Dubin M.D.   On: 07/23/2019 17:39   DG CHEST PORT 1 VIEW  Result Date: 07/23/2019 CLINICAL DATA:  Shortness of breath EXAM: PORTABLE CHEST 1 VIEW COMPARISON:  Radiograph 07/16/2019, CT 06/12/2017 FINDINGS: Pacer pack overlies the left chest wall with leads at the cardiac apex and coronary sinus. Telemetry leads overlie the chest. Nasal cannula projects over the upper chest. Surgical clips are seen at the base of the neck. Interval improvement in the diffuse bilateral opacities throughout both lungs with more focal consolidation in the left lung base. No visible pneumothorax. No effusion. No acute osseous or soft tissue abnormality. Degenerative changes are present in the imaged spine and shoulders. Severe curvature of the thoracolumbar spine with thoracic levocurvature and lumbar dextrocurvature. IMPRESSION: Interval improvement in the diffuse bilateral opacities likely reflecting COVID-19 pneumonia. Electronically Signed   By: Lovena Le M.D.   On: 07/23/2019 06:53   DG Chest Port 1 View  Result Date: 07/16/2019 CLINICAL  DATA:  Shortness of breath. EXAM: PORTABLE CHEST 1 VIEW COMPARISON:  07/09/2019 and CT chest 06/12/2017. FINDINGS: Trachea is midline. Heart is enlarged, stable. Pacemaker lead tips are stable in position. Worsening diffuse bilateral airspace opacification with left lower lobe consolidation. No definite pleural fluid. Thyroidectomy clips. Probable bone infarct or enchondroma in the proximal right humerus. IMPRESSION: Worsening bilateral airspace opacification, consistent with worsening COVID-19 pneumonia. Electronically Signed   By: Lorin Picket M.D.   On: 07/16/2019 09:04   DG CHEST PORT 1 VIEW  Result Date: 07/09/2019 CLINICAL DATA:  Shortness of breath EXAM: PORTABLE  CHEST 1 VIEW COMPARISON:  07/06/2019 FINDINGS: Patchy bilateral airspace disease again noted, worsening since prior study, particularly in the left lower lobe. Cardiomegaly. Pacer remains in place, unchanged. No effusions or pneumothorax. No acute bony abnormality. IMPRESSION: Patchy bilateral airspace opacities, worsening since prior study, particularly in the left lower lobe. Electronically Signed   By: Rolm Baptise M.D.   On: 07/09/2019 21:47   DG Chest Port 1 View  Result Date: 07/03/2019 CLINICAL DATA:  COVID-19 positive.  Worsening shortness of breath. EXAM: PORTABLE CHEST 1 VIEW COMPARISON:  07/03/2019. FINDINGS: Surgical clips noted over the neck. AICD in stable position. Stable cardiomegaly. Diffuse severe bilateral pulmonary infiltrates again noted. Slight improvement in aeration on today's exam. No pleural effusion pneumothorax. Old infarct proximal right humerus. No acute bony abnormality. IMPRESSION: 1.  AICD in stable position.  Stable cardiomegaly. 2. Diffuse severe bilateral pulmonary infiltrates again noted. Slight improvement in aeration on today's exam. Electronically Signed   By: Marcello Moores  Register   On: 07/03/2019 07:15   DG Chest Port 1 View  Result Date: 07/09/2019 CLINICAL DATA:  Hypoxia. EXAM: PORTABLE CHEST 1 VIEW COMPARISON:  None. FINDINGS: There is a dual lead AICD. Moderate severity diffuse bilateral infiltrates are seen. There is a small right pleural effusion. No pneumothorax is identified. The cardiac silhouette is markedly enlarged. Multiple radiopaque surgical clips are seen overlying the superior mediastinum. There is marked severity levoscoliosis of the thoracic spine with moderate severity multilevel degenerative changes. IMPRESSION: 1. Moderate severity diffuse bilateral infiltrates with a small right pleural effusion. 2. Marked enlargement of the cardiac silhouette. 3. Dual lead AICD in place. Electronically Signed   By: Virgina Norfolk M.D.   On: 07/05/2019 15:50   DG  Chest Port 1V today  Result Date: 07/06/2019 CLINICAL DATA:  Dyspnea, COVID-19 positive EXAM: PORTABLE CHEST 1 VIEW COMPARISON:  07/03/2019 chest radiograph. FINDINGS: Stable configuration of 2 lead left subclavian pacemaker. Surgical clips overlie the central lower neck. Stable cardiomediastinal silhouette with mild cardiomegaly. No pneumothorax. No pleural effusion. Patchy hazy opacities throughout the mid to lower lungs bilaterally, similar to minimally improved. IMPRESSION: Patchy hazy opacities throughout the mid to lower lungs bilaterally, similar to minimally improved, compatible with COVID-19 pneumonia. Stable cardiomegaly. Electronically Signed   By: Ilona Sorrel M.D.   On: 07/06/2019 13:09   ECHOCARDIOGRAM COMPLETE  Result Date: 07/23/2019   ECHOCARDIOGRAM REPORT   Patient Name:   NISAA SUCHER        Date of Exam: 07/23/2019 Medical Rec #:  [id number]              Height:       58.0 in Accession #:    [id number]             Weight:       132.1 lb Date of Birth:  05-28-1956  BSA:          1.53 m Patient Age:    73 years               BP:           96/52 mmHg Patient Gender: F                      HR:           72 bpm. Exam Location:  [facility] Procedure: 2D Echo Indications:    Hypotension  History:        Patient has prior history of Echocardiogram examinations, most                 recent 03/25/2019. CHF, Pacemaker, Covis-19 Positive,                 Arrythmias:Atrial Fibrillation; Risk Factors:Diabetes,                 Hypertension and Dyslipidemia.  Sonographer:    Mikki Santee RDCS (AE) Referring Phys: [id number] Dennis Killilea S Samuel Mcpeek IMPRESSIONS  1. Visually appears to have mild-moderate LVH, most prominent in septum. Overall normal LVEF on this study, improved from prior, with LV apical hypokinesis and prominent apical wall thickness.  2. Left ventricular ejection fraction, by visual estimation, is 55 to 60%. The left ventricle has normal function. There is moderately  increased left ventricular hypertrophy.  3. Moderate hypokinesis of the left ventricular, apical apical segment.  4. Left ventricular diastolic parameters are indeterminate.  5. The left ventricle demonstrates regional wall motion abnormalities.  6. Global right ventricle has low normal systolic function.The right ventricular size is normal. No increase in right ventricular wall thickness.  7. Left atrial size was severely dilated.  8. Right atrial size was normal.  9. Small pericardial effusion. 10. Mild mitral annular calcification. 11. The mitral valve is normal in structure. Mild mitral valve regurgitation. 12. The tricuspid valve is normal in structure. 13. The tricuspid valve is normal in structure. Tricuspid valve regurgitation is trivial. 14. The aortic valve is tricuspid. Aortic valve regurgitation is trivial. 15. The pulmonic valve was grossly normal. Pulmonic valve regurgitation is trivial. 16. A pacer wire is visualized in the RA and RV. FINDINGS  Left Ventricle: Left ventricular ejection fraction, by visual estimation, is 55 to 60%. The left ventricle has normal function. Moderate hypokinesis of the left ventricular, apical apical segment. The left ventricle demonstrates regional wall motion abnormalities. There is moderately increased left ventricular hypertrophy. Asymmetric left ventricular hypertrophy of the basal-septal wall. Left ventricular diastolic parameters are indeterminate. Right Ventricle: The right ventricular size is normal. No increase in right ventricular wall thickness. Global RV systolic function is has low normal systolic function. Left Atrium: Left atrial size was severely dilated. Right Atrium: Right atrial size was normal in size Pericardium: A small pericardial effusion is present. Mitral Valve: The mitral valve is normal in structure. Mild mitral annular calcification. Mild mitral valve regurgitation. Tricuspid Valve: The tricuspid valve is normal in structure. Tricuspid valve  regurgitation is trivial. Aortic Valve: The aortic valve is tricuspid. Aortic valve regurgitation is trivial. Pulmonic Valve: The pulmonic valve was grossly normal. Pulmonic valve regurgitation is trivial. Pulmonic regurgitation is trivial. Aorta: The aortic root, ascending aorta and aortic arch are all structurally normal, with no evidence of dilitation or obstruction. Pulmonary Artery: The pulmonary artery is not well seen. IAS/Shunts: No atrial level shunt detected by color flow Doppler. Additional  Comments: A pacer wire is visualized in the right atrium and right ventricle.  LEFT VENTRICLE PLAX 2D LVIDd:         4.21 cm  Diastology LVIDs:         2.91 cm  LV e' lateral: 5.22 cm/s LV PW:         1.10 cm  LV e' medial:  5.77 cm/s LV IVS:        1.72 cm LVOT diam:     1.90 cm LV SV:         47 ml LV SV Index:   29.34 LVOT Area:     2.84 cm  RIGHT VENTRICLE TAPSE (M-mode): 1.7 cm LEFT ATRIUM              Index       RIGHT ATRIUM           Index LA diam:        3.90 cm  2.55 cm/m  RA Area:     15.40 cm LA Vol (A2C):   99.6 ml  65.24 ml/m RA Volume:   35.70 ml  23.39 ml/m LA Vol (A4C):   96.8 ml  63.41 ml/m LA Biplane Vol: 106.0 ml 69.44 ml/m  AORTIC VALVE LVOT Vmax:   190.00 cm/s LVOT Vmean:  118.000 cm/s LVOT VTI:    0.356 m  AORTA Ao Root diam: 3.00 cm TRICUSPID VALVE TR Peak grad:   20.2 mmHg TR Vmax:        225.00 cm/s  SHUNTS Systemic VTI:  0.36 m Systemic Diam: 1.90 cm  Buford Dresser MD Electronically signed by Buford Dresser MD Signature Date/Time: 07/23/2019/1:37:02 PM    Final    [place] EKG SITE RITE  Result Date: 07/23/2019 If Site Rite image not attached, placement could not be confirmed due to current cardiac rhythm.

## 2019-07-27 NOTE — Progress Notes (Addendum)
Spoke with Dr. Waldron Labs and notified him of CBG 48. Pt symptomatic. Disoriented x4. Unable to follow commands. Orders for PRN D50 given and D5 IVF increased to 50 ml/hr

## 2019-07-27 NOTE — Progress Notes (Addendum)
Spoke with Dr. Waldron Labs and informed him that pt CBG has increased to 152 and she is still difficult to arouse. Pt still AOx0 and a RASS of -4. Pt also using accessory muscles to breathe and has s/s of air hunger despite being on 40L and 100% HHFNC and 15L NRB.   Dr. Waldron Labs aware of the changes in neuro status. No new orders at this time.

## 2019-07-27 NOTE — Progress Notes (Signed)
Spoke to brother, given update on patient status. Stated he had no questions

## 2019-07-27 NOTE — Progress Notes (Signed)
Pt arrived to [facility] from [facility] ICU. Pt placed back on heated high flow nasal cannula 40L 100% to 15L NRB mask. Pt tolerated well, RT will continue to monitor.

## 2019-07-27 NOTE — Progress Notes (Signed)
Transferred to PCU via bed by Zeron RN

## 2019-07-28 DIAGNOSIS — A419 Sepsis, unspecified organism: Secondary | ICD-10-CM

## 2019-07-28 DIAGNOSIS — R7881 Bacteremia: Secondary | ICD-10-CM

## 2019-07-28 DIAGNOSIS — E1142 Type 2 diabetes mellitus with diabetic polyneuropathy: Secondary | ICD-10-CM

## 2019-07-28 DIAGNOSIS — R6521 Severe sepsis with septic shock: Secondary | ICD-10-CM

## 2019-07-28 LAB — COMPREHENSIVE METABOLIC PANEL
ALT: 21 U/L (ref 0–44)
AST: 22 U/L (ref 15–41)
Albumin: 2.1 g/dL — ABNORMAL LOW (ref 3.5–5.0)
Alkaline Phosphatase: 138 U/L — ABNORMAL HIGH (ref 38–126)
Anion gap: 11 (ref 5–15)
BUN: 68 mg/dL — ABNORMAL HIGH (ref 8–23)
CO2: 22 mmol/L (ref 22–32)
Calcium: 8.8 mg/dL — ABNORMAL LOW (ref 8.9–10.3)
Chloride: 112 mmol/L — ABNORMAL HIGH (ref 98–111)
Creatinine, Ser: 2.05 mg/dL — ABNORMAL HIGH (ref 0.44–1.00)
GFR calc Af Amer: 29 mL/min — ABNORMAL LOW (ref >60)
GFR calc non Af Amer: 25 mL/min — ABNORMAL LOW (ref >60)
Glucose, Bld: 239 mg/dL — ABNORMAL HIGH (ref 70–99)
Potassium: 4.3 mmol/L (ref 3.5–5.1)
Sodium: 145 mmol/L (ref 135–145)
Total Bilirubin: 1.4 mg/dL — ABNORMAL HIGH (ref 0.3–1.2)
Total Protein: 5.7 g/dL — ABNORMAL LOW (ref 6.5–8.1)

## 2019-07-28 LAB — CULTURE, BLOOD (ROUTINE X 2)

## 2019-07-28 LAB — CBC
HCT: 40.1 % (ref 36.0–46.0)
Hemoglobin: 11.7 g/dL — ABNORMAL LOW (ref 12.0–15.0)
MCH: 31 pg (ref 26.0–34.0)
MCHC: 29.2 g/dL — ABNORMAL LOW (ref 30.0–36.0)
MCV: 106.4 fL — ABNORMAL HIGH (ref 80.0–100.0)
Platelets: 139 10*3/uL — ABNORMAL LOW (ref 150–400)
RBC: 3.77 MIL/uL — ABNORMAL LOW (ref 3.87–5.11)
RDW: 15.7 % — ABNORMAL HIGH (ref 11.5–15.5)
WBC: 13.6 10*3/uL — ABNORMAL HIGH (ref 4.0–10.5)
nRBC: 2.3 % — ABNORMAL HIGH (ref 0.0–0.2)

## 2019-07-28 LAB — PROCALCITONIN: Procalcitonin: 9.83 ng/mL

## 2019-07-28 LAB — GLUCOSE, CAPILLARY
Glucose-Capillary: 164 mg/dL — ABNORMAL HIGH (ref 70–99)
Glucose-Capillary: 172 mg/dL — ABNORMAL HIGH (ref 70–99)
Glucose-Capillary: 179 mg/dL — ABNORMAL HIGH (ref 70–99)
Glucose-Capillary: 179 mg/dL — ABNORMAL HIGH (ref 70–99)

## 2019-07-28 MED ORDER — ENOXAPARIN SODIUM 60 MG/0.6ML ~~LOC~~ SOLN: 1.0000 mg/kg | SUBCUTANEOUS | Status: DC

## 2019-07-28 MED ORDER — MORPHINE SULFATE (PF) 2 MG/ML IV SOLN
1.0000 mg | INTRAVENOUS | Status: DC | PRN
  Administered 2019-07-28: 1 mg via INTRAVENOUS
  Filled 2019-07-28: qty 1

## 2019-07-28 MED ORDER — LORAZEPAM 1 MG PO TABS: 1.0000 mg | ORAL_TABLET | ORAL | Status: DC | PRN

## 2019-07-28 MED ORDER — LORAZEPAM 2 MG/ML IJ SOLN
1.0000 mg | INTRAMUSCULAR | Status: DC | PRN
  Administered 2019-07-28: 1 mg via INTRAVENOUS
  Filled 2019-07-28: qty 1

## 2019-07-28 MED ORDER — SODIUM CHLORIDE 0.9 % IV SOLN: 2.0000 g | INTRAVENOUS | Status: DC

## 2019-07-28 MED ORDER — DIPHENHYDRAMINE HCL 50 MG/ML IJ SOLN: 12.5000 mg | INTRAMUSCULAR | Status: DC | PRN

## 2019-07-28 MED ORDER — LORAZEPAM 2 MG/ML PO CONC: 1.0000 mg | ORAL | Status: DC | PRN

## 2019-07-28 DEATH — deceased

## 2019-07-29 ENCOUNTER — Telehealth: Payer: Self-pay | Admitting: Internal Medicine

## 2019-07-29 DIAGNOSIS — J189 Pneumonia, unspecified organism: Secondary | ICD-10-CM

## 2019-07-29 NOTE — Telephone Encounter (Signed)
Sorry to hear this.  

## 2019-07-29 NOTE — Telephone Encounter (Signed)
So sorry! 

## 2019-07-29 NOTE — Telephone Encounter (Signed)
Communication noted.  

## 2019-07-29 NOTE — Telephone Encounter (Signed)
So sorry to hear this.  

## 2019-07-29 NOTE — Telephone Encounter (Signed)
Pt is deceased. 

## 2019-07-29 NOTE — Telephone Encounter (Signed)
I'm sorry to hear this.

## 2019-07-29 NOTE — Telephone Encounter (Signed)
Sorry to hear 

## 2019-08-25 NOTE — Death Summary Note (Signed)
DEATH SUMMARY   Patient Details  Name: [name] MRN: [id number] DOB: 06-10-1956  Admission/Discharge Information   Admit Date:  2019-07-22  Date of Death: Date of Death: 2019-08-17  Time of Death: Time of Death: Oct 07, 1847  Length of Stay: Oct 09, 2022  Referring Physician: Celene Squibb, MD   Reason(s) for Hospitalization   Acute Hypoxic Resp Failure due to Covid 19 Viral pneumonia  - with  concurrent bacterial pneumonia with underlying amiodarone induced lung injury.  Septic shock/UTI/Enterobacter Cloace bacteremia/HCAP  AKI on CKD stage IIIa  Acute metabolic encephalopathy  HTN  Persistent atrial fibrillation s/p AV node ablation and PPM insertion . -has history of amiodarone induced lung toxicity  Chronic combined systolic and diastolic heart failure percent on echocardiogram in 02/2019:   Bronchial asthma:   DM-2 (A1c 6.8  Hypothyroidism  History of amiodarone induced lung toxicity/ILD  Remote history of ASD repair   Deconditioning/debility  Hyperkalemia.    Hypernatremia   Diagnoses  Preliminary cause of death:   Severe sepsis ([place]) Secondary Diagnoses (including complications and co-morbidities):  Active Problems:   A-fib (HCC)   DM (diabetes mellitus) ([place])   CKD (chronic kidney disease), stage III   ILD (interstitial lung disease) (HCC)   Non-ischemic cardiomyopathy (HCC)   CAD (coronary artery disease)   Atrial fibrillation with rapid ventricular response (HCC)   Acute respiratory disease due to COVID-19 virus   AKI (acute kidney injury) ([place])   SIRS (systemic inflammatory response syndrome) (HCC)   Severe sepsis with septic shock ([place])   Bacteremia due to Gram-negative bacteria   HCAP (healthcare-associated pneumonia)   Brief Hospital Course (including significant findings, care, treatment, and services provided and events leading to death)  [name] is a 64 y.o. 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   Post admission-she had significant worsening of her hypoxemia-requiring 100% NRB-she was then transferred to [facility].  Hypoxia gradually improved with steroids/remdesivir and empiric antibiotics-however on 1/12-1/13-hypoxia briefly worsened-chest x-ray showed a new left-sided infiltrate.  Hospital course was also complicated by development of AKI and hypernatremia.  Patient was provided supportive care with IV fluids, and was restarted on IV antibiotics (Unasyn)-with further improvement of hypoxemia. ON 1/27patient hypotensive, septic, transferred to ICU, requiring pressor support.    With low CVP, where she was appropriately volume resuscitated, her septic work-up was significant for Enterobacter bacteremia, UTI, and HCAP, with significantly worsening bilateral opacity/infiltrate/ARDS, with significant increase of oxygen requirement, even though she was weaned off pressor, he was treated appropriately with broad spectrum antibiotic coverage including vancomycin, cefepime and Flagyl, despite that respiratory status continues to worsen, as well she became more encephalopathic, with significantly increased work of breathing, where she did require 45 L / 100% heated high flow nasal cannula in combination with 100% NRB , where her renal function as well continues to worsen , with significantly increased work of breathing, no improvement despite broad-spectrum antibiotics , brother has been updated daily, about her clinical course, patient had protracted hospital course, with multiple complications, there was no  meaningful recovery anticipated, as well patient respiratory status continues to worsening, goals of care discussion has been  made multiple times over the phone, patient is actively dying, brother was able to visit his sister, she was transitioned to comfort care, she passed away 08/17/19.   Pertinent Labs and Studies  Significant Diagnostic Studies DG CHEST PORT 1 VIEW  Result Date: 07/27/2019 CLINICAL DATA:  Follow-up coronavirus pneumonia. EXAM: PORTABLE CHEST 1 VIEW COMPARISON:  07/25/2019 FINDINGS: Right internal jugular central line tip unchanged in the SVC above the right atrium. Pacemaker as seen previously. Widespread bilateral pulmonary infiltrates appear the same. No evidence of new consolidation, collapse or effusion. IMPRESSION: No change.  Widespread bilateral pulmonary infiltrates. Electronically Signed   By: Nelson Chimes M.D.   On: 07/27/2019 07:32   DG Chest Port 1 View  Result Date: 07/25/2019 CLINICAL DATA:  Hypoxia EXAM: PORTABLE CHEST 1 VIEW COMPARISON:  Two days ago FINDINGS: Cardiomegaly. Biventricular pacer from the left. Diffuse pulmonary opacities are progressed. Right IJ line with tip at the upper cavoatrial junction. No evident effusion or pneumothorax IMPRESSION: Worsening bilateral pneumonia. Electronically Signed   By: Monte Fantasia M.D.   On: 07/25/2019 11:28   DG CHEST PORT 1 VIEW  Result Date: 07/23/2019 CLINICAL DATA:  Central line placement. EXAM: PORTABLE CHEST 1 VIEW COMPARISON:  Chest x-ray from same day at 0653 hours. FINDINGS: New right internal jugular central venous catheter with tip in the distal SVC. Unchanged left chest wall pacemaker. Stable cardiomegaly. Patchy airspace disease throughout both lungs has mildly worsened. No pleural effusion or pneumothorax. No acute osseous abnormality. IMPRESSION: 1. New right internal jugular central venous catheter without complicating feature. 2. Worsened multifocal pneumonia. Electronically Signed   By: Titus Dubin M.D.   On: 07/23/2019 17:39   DG CHEST PORT 1 VIEW  Result Date: 07/23/2019 CLINICAL DATA:  Shortness of breath EXAM: PORTABLE CHEST 1 VIEW COMPARISON:  Radiograph 07/16/2019, CT 06/12/2017 FINDINGS: Pacer pack overlies the left chest wall with leads at the cardiac apex and coronary sinus. Telemetry leads overlie the chest. Nasal cannula projects over the upper chest. Surgical clips are seen at the base of the neck. Interval improvement in the diffuse bilateral opacities throughout both lungs with more focal consolidation in the left lung base. No visible pneumothorax. No effusion. No acute osseous or soft tissue abnormality. Degenerative changes are present in the imaged spine and shoulders. Severe curvature of the thoracolumbar spine with thoracic levocurvature and lumbar dextrocurvature. IMPRESSION: Interval improvement in the diffuse bilateral opacities likely reflecting COVID-19 pneumonia. Electronically Signed   By: Lovena Le M.D.   On: 07/23/2019 06:53   DG Chest Port 1 View  Result Date: 07/16/2019 CLINICAL DATA:  Shortness of breath. EXAM: PORTABLE CHEST 1 VIEW COMPARISON:  07/09/2019 and CT chest 06/12/2017. FINDINGS: Trachea is midline. Heart is enlarged, stable. Pacemaker lead tips are stable in position. Worsening diffuse bilateral airspace opacification with left lower lobe consolidation. No definite pleural fluid. Thyroidectomy clips. Probable bone infarct or enchondroma in the proximal right humerus. IMPRESSION: Worsening bilateral airspace opacification, consistent with worsening COVID-19 pneumonia. Electronically Signed   By: Lorin Picket M.D.   On: 07/16/2019 09:04   DG CHEST PORT 1 VIEW  Result Date: 07/09/2019 CLINICAL DATA:  Shortness of breath EXAM: PORTABLE CHEST 1 VIEW COMPARISON:  07/06/2019 FINDINGS: Patchy bilateral airspace disease again noted, worsening since prior study, particularly in the left lower lobe. Cardiomegaly. Pacer remains in place, unchanged. No  effusions or pneumothorax. No acute bony abnormality. IMPRESSION: Patchy bilateral airspace opacities, worsening since prior study, particularly in the left lower lobe.  Electronically Signed   By: Rolm Baptise M.D.   On: 07/09/2019 21:47   DG Chest Port 1 View  Result Date: 07/03/2019 CLINICAL DATA:  COVID-19 positive.  Worsening shortness of breath. EXAM: PORTABLE CHEST 1 VIEW COMPARISON:  07/16/2019. FINDINGS: Surgical clips noted over the neck. AICD in stable position. Stable cardiomegaly. Diffuse severe bilateral pulmonary infiltrates again noted. Slight improvement in aeration on today's exam. No pleural effusion pneumothorax. Old infarct proximal right humerus. No acute bony abnormality. IMPRESSION: 1.  AICD in stable position.  Stable cardiomegaly. 2. Diffuse severe bilateral pulmonary infiltrates again noted. Slight improvement in aeration on today's exam. Electronically Signed   By: Marcello Moores  Register   On: 07/03/2019 07:15   DG Chest Port 1 View  Result Date: 06/30/2019 CLINICAL DATA:  Hypoxia. EXAM: PORTABLE CHEST 1 VIEW COMPARISON:  None. FINDINGS: There is a dual lead AICD. Moderate severity diffuse bilateral infiltrates are seen. There is a small right pleural effusion. No pneumothorax is identified. The cardiac silhouette is markedly enlarged. Multiple radiopaque surgical clips are seen overlying the superior mediastinum. There is marked severity levoscoliosis of the thoracic spine with moderate severity multilevel degenerative changes. IMPRESSION: 1. Moderate severity diffuse bilateral infiltrates with a small right pleural effusion. 2. Marked enlargement of the cardiac silhouette. 3. Dual lead AICD in place. Electronically Signed   By: Virgina Norfolk M.D.   On: 07/20/2019 15:50   DG Chest Port 1V today  Result Date: 07/06/2019 CLINICAL DATA:  Dyspnea, COVID-19 positive EXAM: PORTABLE CHEST 1 VIEW COMPARISON:  07/03/2019 chest radiograph. FINDINGS: Stable configuration of 2 lead left  subclavian pacemaker. Surgical clips overlie the central lower neck. Stable cardiomediastinal silhouette with mild cardiomegaly. No pneumothorax. No pleural effusion. Patchy hazy opacities throughout the mid to lower lungs bilaterally, similar to minimally improved. IMPRESSION: Patchy hazy opacities throughout the mid to lower lungs bilaterally, similar to minimally improved, compatible with COVID-19 pneumonia. Stable cardiomegaly. Electronically Signed   By: Ilona Sorrel M.D.   On: 07/06/2019 13:09   ECHOCARDIOGRAM COMPLETE  Result Date: 07/23/2019   ECHOCARDIOGRAM REPORT   Patient Name:   SOPHELIA LEGETTE        Date of Exam: 07/23/2019 Medical Rec #:  [id number]              Height:       58.0 in Accession #:    [id number]             Weight:       132.1 lb Date of Birth:  05-Jan-1956             BSA:          1.53 m Patient Age:    77 years               BP:           96/52 mmHg Patient Gender: F                      HR:           72 bpm. Exam Location:  Inpatient [facility] Procedure: 2D Echo Indications:    Hypotension  History:        Patient has prior history of Echocardiogram examinations, most                 recent 03/25/2019. CHF, Pacemaker, Covis-19 Positive,                 Arrythmias:Atrial  Fibrillation; Risk Factors:Diabetes,                 Hypertension and Dyslipidemia.  Sonographer:    Mikki Santee RDCS (AE) Referring Phys: [id number] Asianae Minkler S Huan Pollok IMPRESSIONS  1. Visually appears to have mild-moderate LVH, most prominent in septum. Overall normal LVEF on this study, improved from prior, with LV apical hypokinesis and prominent apical wall thickness.  2. Left ventricular ejection fraction, by visual estimation, is 55 to 60%. The left ventricle has normal function. There is moderately increased left ventricular hypertrophy.  3. Moderate hypokinesis of the left ventricular, apical apical segment.  4. Left ventricular diastolic parameters are indeterminate.  5. The left ventricle demonstrates  regional wall motion abnormalities.  6. Global right ventricle has low normal systolic function.The right ventricular size is normal. No increase in right ventricular wall thickness.  7. Left atrial size was severely dilated.  8. Right atrial size was normal.  9. Small pericardial effusion. 10. Mild mitral annular calcification. 11. The mitral valve is normal in structure. Mild mitral valve regurgitation. 12. The tricuspid valve is normal in structure. 13. The tricuspid valve is normal in structure. Tricuspid valve regurgitation is trivial. 14. The aortic valve is tricuspid. Aortic valve regurgitation is trivial. 15. The pulmonic valve was grossly normal. Pulmonic valve regurgitation is trivial. 16. A pacer wire is visualized in the RA and RV. FINDINGS  Left Ventricle: Left ventricular ejection fraction, by visual estimation, is 55 to 60%. The left ventricle has normal function. Moderate hypokinesis of the left ventricular, apical apical segment. The left ventricle demonstrates regional wall motion abnormalities. There is moderately increased left ventricular hypertrophy. Asymmetric left ventricular hypertrophy of the basal-septal wall. Left ventricular diastolic parameters are indeterminate. Right Ventricle: The right ventricular size is normal. No increase in right ventricular wall thickness. Global RV systolic function is has low normal systolic function. Left Atrium: Left atrial size was severely dilated. Right Atrium: Right atrial size was normal in size Pericardium: A small pericardial effusion is present. Mitral Valve: The mitral valve is normal in structure. Mild mitral annular calcification. Mild mitral valve regurgitation. Tricuspid Valve: The tricuspid valve is normal in structure. Tricuspid valve regurgitation is trivial. Aortic Valve: The aortic valve is tricuspid. Aortic valve regurgitation is trivial. Pulmonic Valve: The pulmonic valve was grossly normal. Pulmonic valve regurgitation is trivial.  Pulmonic regurgitation is trivial. Aorta: The aortic root, ascending aorta and aortic arch are all structurally normal, with no evidence of dilitation or obstruction. Pulmonary Artery: The pulmonary artery is not well seen. IAS/Shunts: No atrial level shunt detected by color flow Doppler. Additional Comments: A pacer wire is visualized in the right atrium and right ventricle.  LEFT VENTRICLE PLAX 2D LVIDd:         4.21 cm  Diastology LVIDs:         2.91 cm  LV e' lateral: 5.22 cm/s LV PW:         1.10 cm  LV e' medial:  5.77 cm/s LV IVS:        1.72 cm LVOT diam:     1.90 cm LV SV:         47 ml LV SV Index:   29.34 LVOT Area:     2.84 cm  RIGHT VENTRICLE TAPSE (M-mode): 1.7 cm LEFT ATRIUM              Index       RIGHT ATRIUM  Index LA diam:        3.90 cm  2.55 cm/m  RA Area:     15.40 cm LA Vol (A2C):   99.6 ml  65.24 ml/m RA Volume:   35.70 ml  23.39 ml/m LA Vol (A4C):   96.8 ml  63.41 ml/m LA Biplane Vol: 106.0 ml 69.44 ml/m  AORTIC VALVE LVOT Vmax:   190.00 cm/s LVOT Vmean:  118.000 cm/s LVOT VTI:    0.356 m  AORTA Ao Root diam: 3.00 cm TRICUSPID VALVE TR Peak grad:   20.2 mmHg TR Vmax:        225.00 cm/s  SHUNTS Systemic VTI:  0.36 m Systemic Diam: 1.90 cm  Buford Dresser MD Electronically signed by Buford Dresser MD Signature Date/Time: 07/23/2019/1:37:02 PM    Final    [place] EKG SITE RITE  Result Date: 07/23/2019 If Site Rite image not attached, placement could not be confirmed due to current cardiac rhythm.   Microbiology Recent Results (from the past 240 hour(s))  MRSA PCR Screening     Status: None   Collection Time: 07/23/19 10:23 AM   Specimen: Nasopharyngeal  Result Value Ref Range Status   MRSA by PCR NEGATIVE NEGATIVE Final    Comment:        The GeneXpert MRSA Assay (FDA approved for NASAL specimens only), is one component of a comprehensive MRSA colonization surveillance program. It is not intended to diagnose MRSA infection nor to guide or monitor  treatment for MRSA infections. Performed at [facility], [place] [address], [place], [place] [address]   Culture, blood (routine x 2)     Status: Abnormal   Collection Time: 07/23/19 10:03 PM   Specimen: BLOOD LEFT HAND  Result Value Ref Range Status   Specimen Description   Final    BLOOD LEFT HAND Performed at [place] [address] [place], [place] [address]    Special Requests   Final    BOTTLES DRAWN AEROBIC ONLY Blood Culture results may not be optimal due to an inadequate volume of blood received in culture bottles Performed at [facility] [address] [place], [place] [address]    Culture  Setup Time   Final    GRAM NEGATIVE RODS AEROBIC BOTTLE ONLY CRITICAL RESULT CALLED TO, READ BACK BY AND VERIFIED WITH: Starlyn Skeans PharmD 16:45 07/25/19 (wilsonm) Performed at [facility], [place] [address], [place], [place] [address]    Culture ENTEROBACTER CLOACAE (A)  Final   Report Status Aug 03, 2019 FINAL  Final   Organism ID, Bacteria ENTEROBACTER CLOACAE  Final      Susceptibility   Enterobacter cloacae - MIC*    CEFAZOLIN >=64 RESISTANT Resistant     CEFEPIME 4 INTERMEDIATE Intermediate     CEFTAZIDIME >=64 RESISTANT Resistant     CIPROFLOXACIN <=0.25 SENSITIVE Sensitive     GENTAMICIN <=1 SENSITIVE Sensitive     IMIPENEM <=0.25 SENSITIVE Sensitive     TRIMETH/SULFA <=20 SENSITIVE Sensitive     PIP/TAZO >=128 RESISTANT Resistant     * ENTEROBACTER CLOACAE  Blood Culture ID Panel (Reflexed)     Status: Abnormal   Collection Time: 07/23/19 10:03 PM  Result Value Ref Range Status   Enterococcus species NOT DETECTED NOT DETECTED Final   Listeria monocytogenes NOT DETECTED NOT DETECTED Final   Staphylococcus species NOT DETECTED NOT DETECTED Final   Staphylococcus aureus (BCID) NOT DETECTED NOT DETECTED Final   Streptococcus species NOT DETECTED NOT  DETECTED Final   Streptococcus agalactiae NOT DETECTED NOT  DETECTED Final   Streptococcus pneumoniae NOT DETECTED NOT DETECTED Final   Streptococcus pyogenes NOT DETECTED NOT DETECTED Final   Acinetobacter baumannii NOT DETECTED NOT DETECTED Final   Enterobacteriaceae species DETECTED (A) NOT DETECTED Final    Comment: Enterobacteriaceae represent a large family of gram-negative bacteria, not a single organism. CRITICAL RESULT CALLED TO, READ BACK BY AND VERIFIED WITH: Starlyn Skeans PharmD 16:45 07/25/19 (wilsonm)    Enterobacter cloacae complex DETECTED (A) NOT DETECTED Final    Comment: CRITICAL RESULT CALLED TO, READ BACK BY AND VERIFIED WITH: Starlyn Skeans PharmD 16:45 07/25/19 (wilsonm)    Escherichia coli NOT DETECTED NOT DETECTED Final   Klebsiella oxytoca NOT DETECTED NOT DETECTED Final   Klebsiella pneumoniae NOT DETECTED NOT DETECTED Final   Proteus species NOT DETECTED NOT DETECTED Final   Serratia marcescens NOT DETECTED NOT DETECTED Final   Carbapenem resistance NOT DETECTED NOT DETECTED Final   Haemophilus influenzae NOT DETECTED NOT DETECTED Final   Neisseria meningitidis NOT DETECTED NOT DETECTED Final   Pseudomonas aeruginosa NOT DETECTED NOT DETECTED Final   Candida albicans NOT DETECTED NOT DETECTED Final   Candida glabrata NOT DETECTED NOT DETECTED Final   Candida krusei NOT DETECTED NOT DETECTED Final   Candida parapsilosis NOT DETECTED NOT DETECTED Final   Candida tropicalis NOT DETECTED NOT DETECTED Final    Comment: Performed at [facility], [place] [address], [place], [place] [address]  Culture, blood (routine x 2)     Status: Abnormal   Collection Time: 07/23/19 10:08 PM   Specimen: BLOOD RIGHT HAND  Result Value Ref Range Status   Specimen Description   Final    BLOOD RIGHT HAND Performed at [facility] [address], [place], [place] [address]    Special Requests   Final    BOTTLES DRAWN AEROBIC ONLY Blood Culture results may not be optimal due to an inadequate volume of blood received in culture  bottles Performed at [facility] [address] [place], [place] [address]    Culture  Setup Time   Final    GRAM NEGATIVE RODS BOTTLES DRAWN AEROBIC ONLY CRITICAL VALUE NOTED.  VALUE IS CONSISTENT WITH PREVIOUSLY REPORTED AND CALLED VALUE.    Culture (A)  Final    ENTEROBACTER CLOACAE SUSCEPTIBILITIES PERFORMED ON PREVIOUS CULTURE WITHIN THE LAST 5 DAYS. Performed at [facility], [place] [address], [place], [place] [address]    Report Status 07/27/2019 FINAL  Final    Lab Basic Metabolic Panel: Recent Labs  Lab 07/23/19 0303 07/23/19 0303 07/24/19 0456 07/25/19 0410 07/26/19 0505 07/27/19 0405 08-01-2019 0601  NA 138   < > 139 143 144 149* 145  K 3.5   < > 3.2* 4.5 3.9 4.1 4.3  CL 91*   < > 102 111 112* 113* 112*  CO2 27   < > 27 24 24 22 22   GLUCOSE <20*   < > 244* 71 163* 150* 239*  BUN 77*   < > 59* 46* 44* 53* 68*  CREATININE 1.88*   < > 1.27* 0.97 1.15* 1.46* 2.05*  CALCIUM 8.5*   < > 7.9* 8.3* 8.3* 8.9 8.8*  MG 1.7  --  2.0  --   --   --   --    < > = values in this interval not displayed.   Liver Function Tests: Recent Labs  Lab 07/24/19 0456 07/25/19 0410  07/26/19 0505 07/27/19 0405 Aug 09, 2019 0601  AST 23 29 27 26 22   ALT 43 39 29 28 21   ALKPHOS 154* 147* 138* 142* 138*  BILITOT 0.9 1.0 1.1 1.4* 1.4*  PROT 5.6* 5.5* 5.4* 6.1* 5.7*  ALBUMIN 2.1* 2.1* 2.0* 2.2* 2.1*   No results for input(s): LIPASE, AMYLASE in the last 168 hours. No results for input(s): AMMONIA in the last 168 hours. CBC: Recent Labs  Lab 07/23/19 0303 07/23/19 0303 07/24/19 0456 07/25/19 0410 07/26/19 0505 07/27/19 0405 08-09-19 0601  WBC 20.2*   < > 21.4* 11.7* 8.9 10.1 13.6*  NEUTROABS 19.4*  --  19.9* 10.0* 7.7 8.8*  --   HGB 14.9   < > 11.5* 11.5* 12.2 12.6 11.7*  HCT 46.1*   < > 36.0 36.2 38.9 42.0 40.1  MCV 96.8   < > 97.6 99.5 100.5* 103.7* 106.4*  PLT PLATELET CLUMPS NOTED ON SMEAR, COUNT APPEARS ADEQUATE   < > 121* 89* 94* 119* 139*   < >  = values in this interval not displayed.   Cardiac Enzymes: No results for input(s): CKTOTAL, CKMB, CKMBINDEX, TROPONINI in the last 168 hours. Sepsis Labs: Recent Labs  Lab 07/25/19 0410 07/26/19 0505 07/27/19 0405 Aug 09, 2019 0601  PROCALCITON 20.45 10.43 6.54 9.83  WBC 11.7* 8.9 10.1 13.6*    Procedures/Operations   Right IJ TLC  Sharyn Brilliant 07/29/2019, 6:15 PM

## 2019-08-25 NOTE — Progress Notes (Signed)
Noted cap missing from CVP monitoring system this AM. CVP system changed and pressure bag reinflated to 216mmHg. MD contacted at this time.

## 2019-08-25 NOTE — Progress Notes (Signed)
Inpatient Diabetes Program Recommendations  AACE/ADA: New Consensus Statement on Inpatient Glycemic Control   Target Ranges:  Prepandial:   less than 140 mg/dL      Peak postprandial:   less than 180 mg/dL (1-2 hours)      Critically ill patients:  140 - 180 mg/dL   Results for [name], [name] (MRN [id number]) as of August 23, 2019 10:46  Ref. Range 07/27/2019 07:43 07/27/2019 11:17 07/27/2019 16:02 07/27/2019 16:31 07/27/2019 22:13 23-Aug-2019 07:29  Glucose-Capillary Latest Ref Range: 70 - 99 mg/dL 131 (H)  Novolog 4 units 132 (H)  Novolog 4 units 48 (L) 152 (H) 122 (H) 172 (H)  Novolog 5 units   Review of Glycemic Control  Current orders for Inpatient glycemic control: Novolog 0-9 units TID with meals, Novolog 0-5 units QHS, Novolog 3 units TID with meals for meal coverage  Inpatient Diabetes Program Recommendations:   Insulin - Meal Coverage: Noted patient received Novolog 3 units this morning at 8:16 am for meal coverage but 0% of breakfast eaten. Patient may experience hypoglycemia as a result of getting meal coverage insulin and not eating. Modified Novolog 3 units TID order to include hold parameters (if patient eats less than 50%, is NPO, or premeal glucose less than 80 mg/dl).  NOTE: Noted MD note today that patient will transition to comfort care.   Thanks, Barnie Alderman, RN, MSN, CDE Diabetes Coordinator Inpatient Diabetes Program [phone] (Team Pager from 8am to 5pm)

## 2019-08-25 NOTE — Progress Notes (Signed)
Pharmacy Antibiotic & Anticoagulation Note  [name] is a 64 y.o. female admitted on 07/11/2019 with COVID-19 pneumonia.  The patient is now on Cefepime for Enterobacter Cloacae bacteremia. Pharmacy consulted to dose.   The patient's renal function has worsened with SCr up to 2.05 << 1.46, CrCl~20-30 ml/min. Will adjust the Cefepime dose today.  Given the change in renal function - will also adjust the Lovenox dosing to q24h.   Plan: - Adjust Cefepime to 2g IV ever 24 hours - Adjust Lovenox to 60 mg SQ every 24 hours - Will continue to follow renal function, culture results, LOT, and antibiotic de-escalation plans   Height: 4\' 10"  (147.3 cm) Weight: 130 lb 15.3 oz (59.4 kg) IBW/kg (Calculated) : 40.9  Temp (24hrs), Avg:97.8 F (36.6 C), Min:97.6 F (36.4 C), Max:98 F (36.7 C)  Recent Labs  Lab 07/24/19 0456 07/25/19 0410 07/26/19 0505 07/27/19 0405 2019-07-29 0601  WBC 21.4* 11.7* 8.9 10.1 13.6*  CREATININE 1.27* 0.97 1.15* 1.46* 2.05*    Estimated Creatinine Clearance: 21.4 mL/min (A) (by C-G formula based on SCr of 2.05 mg/dL (H)).    Allergies  Allergen Reactions  . Flecainide Nausea Only and Other (See Comments)    Faint feeling  . Hydrocodone-Acetaminophen Nausea Only and Other (See Comments)    Severe headache  . Ibuprofen Other (See Comments)    Kidney dysfunction  . Oxycodone Hcl Nausea Only and Other (See Comments)    Headache  . Penicillins Nausea Only and Other (See Comments)    Severe headache Has patient had a PCN reaction causing immediate rash, facial/tongue/throat swelling, SOB or lightheadedness with hypotension: No Has patient had a PCN reaction causing severe rash involving mucus membranes or skin necrosis: No Has patient had a PCN reaction that required hospitalization: No Has patient had a PCN reaction occurring within the last 10 years: No If all of the above answers are "NO", then may proceed with Cephalosporin use.     Antimicrobials  this admission: 1/6 Remdesivir >> 1/10 1/6 Vancomycin >> 1/8, resumed 1/27 >> 1/28, resumed 1/29 >> 1/31 1/6 Cefepime >> 1/12, resumed 1/27 >>   1/14 Unasyn >> 1/19 1/19 Augmentin >> 1/21 1/27 metronidazole >> 1/28, resumed 1/29 >>   Microbiology results: 12/31 PTA COVID - positive 1/6 BCx - negative 1/27 MRSA PCR: neg 1/27 BCx: Enterobacter cloacae complex 1/28 UCx:    Thank you for allowing pharmacy to be a part of this patient's care.  Alycia Rossetti, PharmD, BCPS Clinical Pharmacist 07/29/19 12:08 PM   **Pharmacist phone directory can now be found on [email or web address] ([email or web address]).  Listed under [place].

## 2019-08-25 NOTE — Progress Notes (Signed)
PT Cancellation Note/Discharge Note  Patient Details Name: [name] MRN: [id number] DOB: June 29, 1955   Cancelled Treatment:    Reason Eval/Treat Not Completed: Medical issues which prohibited therapy.  I called RN, Mitzi Hansen, and he said pt is going full comfort measures.  PT to sign off.  Pt had her final family visit today.    Thanks,  Verdene Lennert, PT, DPT  Acute Rehabilitation [phone] pager [phone] office  @ [facility]: [phone]     Harvie Heck 2019-08-21, 4:38 PM

## 2019-08-25 NOTE — Progress Notes (Signed)
Patient taken to family visitation room to visit her brother Eddie Dibbles. Transported on 15L NRB at the time. Patient was experiencing some distress and was medicated as reflected in the [facility]. Brother at bedside for approximately 20 minutes with patient. Support provided to both brother and patient at this time. Transported back to room on NRB and reconnected to [facility]. Currently awaiting orders for comfort care.

## 2019-08-25 NOTE — Progress Notes (Signed)
OT Cancellation Note  Patient Details Name: [name] MRN: [id number] DOB: [birth year]   Cancelled Treatment:    Reason Eval/Treat Not Completed: Other (comment)(Leaving for a family visit. Will return as schedule allows.) OT signing off. Pt on comfort care.   [name],[name] 08-13-2019, 4:45 PM  Maurie Boettcher, OT/L   Acute OT Clinical Specialist Acute Rehabilitation Services Pager [phone] Office [phone]

## 2019-08-25 NOTE — Progress Notes (Addendum)
Deceased patient being transported to hospital morgue.

## 2019-08-25 NOTE — Progress Notes (Signed)
OT Cancellation Note  Patient Details Name: [name] MRN: [id number] DOB: [birth year]   Cancelled Treatment:    Reason Eval/Treat Not Completed: Other (comment)(Leaving for a family visit. Will return as schedule allows. Planning for comfort care.)  [place], OTR/L Acute Rehab Pager: [phone] Office: [phone] Aug 16, 2019, 3:21 PM

## 2019-08-25 NOTE — Progress Notes (Signed)
New orders received for comfort measures only. Patient placed on 2L O2 [place] Monitor on comfort parameters.

## 2019-08-25 NOTE — Progress Notes (Addendum)
PROGRESS NOTE                                                                                                                                                                                                             Patient Demographics:    [name], is a 64 y.o. female, DOB - 01/14/1956, WPV:[id number]  Outpatient Primary MD for the patient is Celene Squibb, MD   Admit date - 07/15/2019   LOS - 43  Chief Complaint  Patient presents with  . Shortness of Breath  . covid pos       Brief Narrative:  Patient is a 64 y.o. 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    Post admission-she had significant worsening of her hypoxemia-requiring 100% NRB-she was then transferred to [facility].  Hypoxia gradually improved with steroids/remdesivir and empiric antibiotics-however on 1/12-1/13-hypoxia briefly worsened-chest x-ray showed a new left-sided infiltrate.  Hospital course was also complicated by development of AKI and hypernatremia.  Patient was provided supportive care with IV fluids, and was restarted on IV antibiotics (Unasyn)-with further improvement of hypoxemia.  -1/27patient hypotensive, septic, transferred to ICU, requiring pressor support.  She required Levophed for 24 hours, CVP initially low, improved with hydration, patient with increased oxygen requirement, chest x-ray showing worsening bilateral opacity.  Oxygen requirement continues to increase despite proper diuresis and broad-spectrum antibiotic  coverage.  COVID-19 Medications:  Steroids: 1/6>> 1/15 Remdesivir: 1/6>>1/10 Convalescent Plasma: 1/7   Antibiotics: Unasyn: 1/14>> Cefepime: 1/6>>1/12 Vancomycin: 1/6>>1/8   Subjective:   Patient in bed, she has been more encephalopathic overnight as discussed with staff, with increased work of breathing .   Assessment  & Plan :   Septic shock/UTI/Enterobacter Cloace  bacteremia/HCAP -Transferred to ICU 1/28, requiring pressor support, initially on Levophed, as well with low CVP required fluid resuscitation. -Blood pressure has stabilized over last 24 hours, no pressor requirement, CVP within normal range as well . -Treated with stress dose steroids -Blood cultures growing Enterobacter cloacae -Treated with broad-spectrum antibiotic, MRSA  PCR is negative, but IV vancomycin 1/31, continue with cefepime and Flagyl . -Chest x-ray showing worsening bilateral opacity. -Patient continues to have worsening respiratory status, she is on 45 L / 100% heated high flow nasal cannula and 100% NRB . -CVP within normal limit, no role for Lasix currently, actually she is on some fluids given low CVP yesterday . -Remains significantly altered, unsafe to swallow, so her p.o. medication has been switched to IV regimen. -No role for IV Lasix currently, as CVP has normalized, actually she will be started on IV fluids.   Acute Hypoxic Resp Failure due to Covid 19 Viral pneumonia  - with  concurrent bacterial pneumonia with underlying amiodarone induced lung injury. -Patient had fluctuating oxygen requirement during hospital stay  -Chest x-ray this morning showing worsening COVID-19 for pneumonia . -Remains with significant oxygen requirement, she is requiring 50 L / 100% heated high flow nasal cannula and NRB, this is most likely in the setting of HCAP and Covid pneumonia.  With underlining lung disease due to amiodarone induced lung injury. - No Improvement with Lasix, CVP has normalized, continue to  hold diuresis.    O2 requirements:  SpO2: 98 % O2 Flow Rate (L/min): 45 L/min FiO2 (%): 100 %   COVID-19 Labs: Recent Labs    07/26/19 0505 07/27/19 0405  DDIMER 1.44* 1.49*  CRP 22.7* 27.5*       Component Value Date/Time   BNP 275.4 (H) 07/19/2019 0115    Recent Labs  Lab 07/25/19 0410 07/26/19 0505 07/27/19 0405 08-19-2019 0601  PROCALCITON 20.45 10.43 6.54 9.83    Lab Results  Component Value Date   SARSCOV2NAA Detected (A) 06/26/2019   [place] NEGATIVE 02/20/2019   [place] Not Detected 12/18/2018    AKI on CKD stage IIIa:  -Currently creatinine has been trending up, it is 2 today.  Acute metabolic encephalopathy:  -Initially altered, this has improved. -It is altered again today unresponsive, with agonal breathing.  HTN:  -Metoprolol has been stopped giving low blood pressure  Persistent atrial fibrillation s/p AV node ablation and PPM insertion:  -Hold metoprolol given hypotension, changed Xarelto to heparin GTT given worsening renal function . -has history of amiodarone induced lung toxicity  Chronic combined systolic and diastolic heart failure percent on echocardiogram in 02/2019:  -Repeat 2D echo this morning showing improved EF to 50%.  Bronchial asthma: Appears stable-no rhonchi-continue bronchodilators.  DM-2 (A1c 6.8): Hypoglycemic episode due to tapering of steroids, now her CBG uncontrolled, she is back on sliding scale, and 3 units before meals  CBG (last 3)  Recent Labs    07/27/19 1631 07/27/19 2213 08-19-2019 0729  GLUCAP 152* 122* 172*   Hypothyroidism: Continue levothyroxine  History of amiodarone induced lung toxicity/ILD: Supportive care.  Remote history of ASD repair - no acute issues.  Deconditioning/debility: Continue PT OT and speech therapy, patient and family are planning home with home health.  Hyperkalemia.  Kayexalate and monitor.  Hypernatremia -Start on D5W  Goals of care: -Have discussed with  brother, patient is extremely frail, with extremely guarded prognosis, with severe sepsis, as well as severe respiratory failure, she was kept on full scope of treatment, but remain DNR/DNI, spite scope of treatment, including antibiotics, pressors, diuresis continues to deteriorate, she is with significant dyspnea/air hunger and increased work of breathing, she was started on as needed morphine for dyspnea, he continues to deteriorate, currently remains encephalopathic, brother has been updated daily on her surgical condition, I have informed him that his  sister is actively dying, and informed him we can arrange for a terminal visit , given she is actively dying, and has agonal breathing, reports he has some arrangement to do, and will call us back with appropriate time for visitationI, he understands time limitation here,, and will let us know about his availability as soon as possible.after Visitation will proceed with institute comfort measures  Addendum 6:00 PM : -Have met her brother, after he had terminal patient visitation, discussed goals of care, I have explained for him that patient is currently actively dying, at this point will focus on comfort care, will start on as needed Ativan and morphine, for air hunger, and comfort measures.  Prognosis: Extremely guarded/poor  Consults  :  PCCM  Procedures  :  None  Condition -critical  Family Communication  : Brother updated daily by phone/he was updated in person after his had terminal patient visitation.  Code Status :  DNR  Diet :  Diet Order            DIET DYS 3 Room service appropriate? Yes; Fluid consistency: Thin  Diet effective now               Disposition Plan  : Health PT once oxygenation improves further.    Antimicorbials  :    Anti-infectives (From admission, onward)   Start     Dose/Rate Route Frequency Ordered Stop   07/26/19 1600  vancomycin (VANCOREADY) IVPB 750 mg/150 mL  Status:  Discontinued     750  mg 150 mL/hr over 60 Minutes Intravenous Every 24 hours 07/25/19 1544 07/27/19 1313   07/25/19 1600  metroNIDAZOLE (FLAGYL) IVPB 500 mg     500 mg 100 mL/hr over 60 Minutes Intravenous Every 8 hours 07/25/19 1524     07/25/19 1600  vancomycin (VANCOCIN) IVPB 1000 mg/200 mL premix     1,000 mg 200 mL/hr over 60 Minutes Intravenous  Once 07/25/19 1544 07/25/19 1807   07/24/19 1030  ceFEPIme (MAXIPIME) 2 g in sodium chloride 0.9 % 100 mL IVPB     2 g 200 mL/hr over 30 Minutes Intravenous Every 12 hours 07/24/19 1012     07/23/19 1600  vancomycin (VANCOCIN) IVPB 1000 mg/200 mL premix     1,000 mg 200 mL/hr over 60 Minutes Intravenous  Once 07/23/19 1517 07/23/19 1827   07/23/19 1534  vancomycin variable dose per unstable renal function (pharmacist dosing)  Status:  Discontinued      Does not apply See admin instructions 07/23/19 1534 07/24/19 1032   07/23/19 1530  ceFEPIme (MAXIPIME) 2 g in sodium chloride 0.9 % 100 mL IVPB  Status:  Discontinued     2 g 200 mL/hr over 30 Minutes Intravenous Every 24 hours 07/23/19 1517 07/24/19 1012   07/23/19 1500  metroNIDAZOLE (FLAGYL) IVPB 500 mg  Status:  Discontinued     500 mg 100 mL/hr over 60 Minutes Intravenous Every 8 hours 07/23/19 1458 07/24/19 1032   07/15/19 1000  amoxicillin-clavulanate (AUGMENTIN) 500-125 MG per tablet 500 mg    Note to Pharmacy: Pharmacy can adjust for aspiration pneumonia, stop date 07/17/2019   1 tablet Oral 2 times daily 07/15/19 0754 07/17/19 2100   07/10/19 1000  ampicillin-sulbactam (UNASYN) 1.5 g in sodium chloride 0.9 % 100 mL IVPB  Status:  Discontinued     1.5 g 200 mL/hr over 30 Minutes Intravenous Every 8 hours 07/10/19 0945 07/15/19 0754   07/04/19 1830  vancomycin (VANCOREADY) IVPB 750 mg/150 mL  Status:  Discontinued     750 mg 150 mL/hr over 60 Minutes Intravenous Every 48 hours 07/03/19 1249 07/04/19 1110   07/04/19 1800  vancomycin (VANCOCIN) IVPB 1000 mg/200 mL premix  Status:  Discontinued     1,000  mg 200 mL/hr over 60 Minutes Intravenous Every 48 hours 07/24/2019 1727 07/03/19 1249   07/03/19 1800  ceFEPIme (MAXIPIME) 1 g in sodium chloride 0.9 % 100 mL IVPB  Status:  Discontinued     1 g 200 mL/hr over 30 Minutes Intravenous Every 24 hours 07/17/2019 1725 07/03/19 1248   07/03/19 1800  ceFEPIme (MAXIPIME) 2 g in sodium chloride 0.9 % 100 mL IVPB     2 g 200 mL/hr over 30 Minutes Intravenous Every 24 hours 07/03/19 1247 07/08/19 1850   07/03/19 1000  remdesivir 100 mg in sodium chloride 0.9 % 100 mL IVPB     100 mg 200 mL/hr over 30 Minutes Intravenous Daily 07/25/2019 1723 07/06/19 0926   07/03/19 1000  remdesivir 100 mg in sodium chloride 0.9 % 100 mL IVPB  Status:  Discontinued     100 mg 200 mL/hr over 30 Minutes Intravenous Daily 07/14/2019 2355 07/03/19 0257   07/05/2019 2354  remdesivir 200 mg in sodium chloride 0.9% 250 mL IVPB  Status:  Discontinued     200 mg 580 mL/hr over 30 Minutes Intravenous Once 07/20/2019 2355 07/03/19 0257   06/29/2019 1800  remdesivir 200 mg in sodium chloride 0.9% 250 mL IVPB     200 mg 580 mL/hr over 30 Minutes Intravenous Once 07/12/2019 1723 07/07/2019 2306   07/07/2019 1730  vancomycin (VANCOCIN) IVPB 1000 mg/200 mL premix  Status:  Discontinued     1,000 mg 200 mL/hr over 60 Minutes Intravenous  Once 07/23/2019 1722 07/22/2019 1725   07/05/2019 1730  ceFEPIme (MAXIPIME) 2 g in sodium chloride 0.9 % 100 mL IVPB     2 g 200 mL/hr over 30 Minutes Intravenous  Once 07/14/2019 1722 07/18/2019 1918   07/01/2019 1730  vancomycin (VANCOREADY) IVPB 1500 mg/300 mL     1,500 mg 150 mL/hr over 120 Minutes Intravenous  Once 06/30/2019 1725 06/30/2019 2036      DVT Prophylaxis  : Xarelto  Inpatient Medications  Scheduled Meds: . vitamin C  500 mg Oral Daily  . chlorhexidine  15 mL Mouth Rinse BID  . Chlorhexidine Gluconate Cloth  6 each Topical Daily  . enoxaparin (LOVENOX) injection  1 mg/kg Subcutaneous Q12H  . fluticasone furoate-vilanterol  2 puff Inhalation Daily  .  insulin aspart  0-5 Units Subcutaneous QHS  . insulin aspart  0-9 Units Subcutaneous TID WC  . insulin aspart  3 Units Subcutaneous TID WC  . levothyroxine  50 mcg Intravenous Daily  . mouth rinse  15 mL Mouth Rinse q12n4p  . pantoprazole (PROTONIX) IV  40 mg Intravenous Q12H  . pravastatin  10 mg Oral Daily  . sodium chloride flush  10-40 mL Intracatheter Q12H  . zinc sulfate  220 mg Oral Daily   Continuous Infusions: . sodium chloride Stopped (07/23/19 1729)  . sodium chloride 10 mL/hr at August 19, 2019 1020  . sodium chloride    . ceFEPime (MAXIPIME) IV Stopped (08-19-19 0955)  . dextrose 50 mL/hr at 08-19-19 1020  . metronidazole Stopped (2019/08/19 0902)  . norepinephrine (LEVOPHED) Adult infusion Stopped (07/24/19 1443)   PRN Meds:.sodium chloride, acetaminophen, albuterol, calcium carbonate, chlorpheniramine-HYDROcodone, dextrose, guaiFENesin-dextromethorphan, lip balm, Melatonin, morphine injection, ondansetron (ZOFRAN) IV, senna-docusate, sodium chloride flush   See  all Orders from today for further details   Phillips Climes M.D on 07/30/19 at 10:25 AM  To page go to [email or web address] - use universal password  [facility] -  Office  [phone]    Objective:   Vitals:   2019/07/30 0700 30-Jul-2019 0800 07/30/19 0900 July 30, 2019 1000  BP: 103/68 111/60 (!) 87/58 112/70  Pulse: 70 75 73 71  Resp: (!) 21 (!) 25 (!) 22 19  Temp:  97.6 F (36.4 C)    TempSrc:      SpO2: 98% 97% 98% 98%  Weight:      Height:        Wt Readings from Last 3 Encounters:  07/23/19 59.4 kg  04/01/19 65.8 kg  04/01/19 64.4 kg     Intake/Output Summary (Last 24 hours) at 07-30-2019 1025 Last data filed at 2019/07/30 1020 Gross per 24 hour  Intake 1649.12 ml  Output 225 ml  Net 1424.12 ml     Physical Exam  Is obtunded, unresponsive, extremely frail and ill-appearing . Diminished air entry bilaterally, tachypneic, with agonal breathing pattern . Paced rhythm on telemetry, no rubs or  gallops . Abdomen soft, nontender, nondistended, bowel sounds present . Extremities with mild pedal edema, minimal discoloration of toes , but good capillary refills ,warm.    Data Review:    CBC Recent Labs  Lab 07/23/19 0303 07/23/19 0303 07/24/19 0456 07/25/19 0410 07/26/19 0505 07/27/19 0405 07/30/2019 0601  WBC 20.2*   < > 21.4* 11.7* 8.9 10.1 13.6*  HGB 14.9   < > 11.5* 11.5* 12.2 12.6 11.7*  HCT 46.1*   < > 36.0 36.2 38.9 42.0 40.1  PLT PLATELET CLUMPS NOTED ON SMEAR, COUNT APPEARS ADEQUATE   < > 121* 89* 94* 119* 139*  MCV 96.8   < > 97.6 99.5 100.5* 103.7* 106.4*  MCH 31.3   < > 31.2 31.6 31.5 31.1 31.0  MCHC 32.3   < > 31.9 31.8 31.4 30.0 29.2*  RDW 14.0   < > 14.0 14.2 14.6 15.2 15.7*  LYMPHSABS 0.4*  --  0.3* 0.6* 0.5* 0.5*  --   MONOABS 0.1  --  0.9 0.8 0.4 0.2  --   EOSABS 0.0  --  0.0 0.1 0.1 0.0  --   BASOSABS 0.0  --  0.0 0.0 0.1 0.0  --    < > = values in this interval not displayed.    Chemistries  Recent Labs  Lab 07/22/19 0246 07/22/19 0246 07/23/19 0303 07/23/19 0303 07/24/19 0456 07/25/19 0410 07/26/19 0505 07/27/19 0405 July 30, 2019 0601  NA 134*   < > 138   < > 139 143 144 149* 145  K 6.0*   < > 3.5   < > 3.2* 4.5 3.9 4.1 4.3  CL 91*   < > 91*   < > 102 111 112* 113* 112*  CO2 31   < > 27   < > '27 24 24 22 22  '$ GLUCOSE 34*   < > <20*   < > 244* 71 163* 150* 239*  BUN 76*   < > 77*   < > 59* 46* 44* 53* 68*  CREATININE 1.47*   < > 1.88*   < > 1.27* 0.97 1.15* 1.46* 2.05*  CALCIUM 8.9   < > 8.5*   < > 7.9* 8.3* 8.3* 8.9 8.8*  MG 2.2  --  1.7  --  2.0  --   --   --   --  AST 36   < > 54*   < > '23 29 27 26 22  '$ ALT 50*   < > 72*   < > 43 39 '29 28 21  '$ ALKPHOS 187*   < > 212*   < > 154* 147* 138* 142* 138*  BILITOT 1.0   < > 1.3*   < > 0.9 1.0 1.1 1.4* 1.4*   < > = values in this interval not displayed.   ------------------------------------------------------------------------------------------------------------------ No results for input(s):  CHOL, HDL, LDLCALC, TRIG, CHOLHDL, LDLDIRECT in the last 72 hours.  Lab Results  Component Value Date   HGBA1C 6.8 (H) 07/06/2019   ------------------------------------------------------------------------------------------------------------------ No results for input(s): TSH, T4TOTAL, T3FREE, THYROIDAB in the last 72 hours.  Invalid input(s): FREET3 ------------------------------------------------------------------------------------------------------------------ No results for input(s): VITAMINB12, FOLATE, FERRITIN, TIBC, IRON, RETICCTPCT in the last 72 hours.  Coagulation profile No results for input(s): INR, PROTIME in the last 168 hours.  Recent Labs    07/26/19 0505 07/27/19 0405  DDIMER 1.44* 1.49*    Cardiac Enzymes No results for input(s): CKMB, TROPONINI, MYOGLOBIN in the last 168 hours.  Invalid input(s): CK ------------------------------------------------------------------------------------------------------------------    Component Value Date/Time   BNP 275.4 (H) 07/19/2019 0115    Micro Results Recent Results (from the past 240 hour(s))  MRSA PCR Screening     Status: None   Collection Time: 07/23/19 10:23 AM   Specimen: Nasopharyngeal  Result Value Ref Range Status   MRSA by PCR NEGATIVE NEGATIVE Final    Comment:        The GeneXpert MRSA Assay (FDA approved for NASAL specimens only), is one component of a comprehensive MRSA colonization surveillance program. It is not intended to diagnose MRSA infection nor to guide or monitor treatment for MRSA infections. Performed at [facility], [place] [address] [place], [place] [address]   Culture, blood (routine x 2)     Status: Abnormal (Preliminary result)   Collection Time: 07/23/19 10:03 PM   Specimen: BLOOD LEFT HAND  Result Value Ref Range Status   Specimen Description   Final    BLOOD LEFT HAND Performed at [place] [address], [place], [place]  [address]    Special Requests   Final    BOTTLES DRAWN AEROBIC ONLY Blood Culture results may not be optimal due to an inadequate volume of blood received in culture bottles Performed at [place] [address] [place], [place] [address]    Culture  Setup Time   Final    GRAM NEGATIVE RODS AEROBIC BOTTLE ONLY CRITICAL RESULT CALLED TO, READ BACK BY AND VERIFIED WITH: Starlyn Skeans PharmD 16:45 07/25/19 ([email or web address])    Culture (A)  Final    ENTEROBACTER CLOACAE REPEATING SUSCEPTIBILITIES Performed at [facility], [place] [address], [place], [place] [address]    Report Status PENDING  Incomplete  Blood Culture ID Panel (Reflexed)     Status: Abnormal   Collection Time: 07/23/19 10:03 PM  Result Value Ref Range Status   Enterococcus species NOT DETECTED NOT DETECTED Final   Listeria monocytogenes NOT DETECTED NOT DETECTED Final   Staphylococcus species NOT DETECTED NOT DETECTED Final   Staphylococcus aureus (BCID) NOT DETECTED NOT DETECTED Final   Streptococcus species NOT DETECTED NOT DETECTED Final   Streptococcus agalactiae NOT DETECTED NOT DETECTED Final   Streptococcus pneumoniae NOT DETECTED NOT DETECTED Final   Streptococcus pyogenes NOT DETECTED NOT DETECTED Final   Acinetobacter baumannii NOT DETECTED NOT DETECTED  Final   Enterobacteriaceae species DETECTED (A) NOT DETECTED Final    Comment: Enterobacteriaceae represent a large family of gram-negative bacteria, not a single organism. CRITICAL RESULT CALLED TO, READ BACK BY AND VERIFIED WITH: Starlyn Skeans PharmD 16:45 07/25/19 ([email or web address])    Enterobacter cloacae complex DETECTED (A) NOT DETECTED Final    Comment: CRITICAL RESULT CALLED TO, READ BACK BY AND VERIFIED WITH: Starlyn Skeans PharmD 16:45 07/25/19 ([email or web address])    Escherichia coli NOT DETECTED NOT DETECTED Final   Klebsiella oxytoca NOT DETECTED NOT DETECTED Final   Klebsiella pneumoniae NOT DETECTED NOT DETECTED Final   Proteus species NOT DETECTED NOT DETECTED  Final   Serratia marcescens NOT DETECTED NOT DETECTED Final   Carbapenem resistance NOT DETECTED NOT DETECTED Final   Haemophilus influenzae NOT DETECTED NOT DETECTED Final   Neisseria meningitidis NOT DETECTED NOT DETECTED Final   Pseudomonas aeruginosa NOT DETECTED NOT DETECTED Final   Candida albicans NOT DETECTED NOT DETECTED Final   Candida glabrata NOT DETECTED NOT DETECTED Final   Candida krusei NOT DETECTED NOT DETECTED Final   Candida parapsilosis NOT DETECTED NOT DETECTED Final   Candida tropicalis NOT DETECTED NOT DETECTED Final    Comment: Performed at [facility], [place] [address], [place], [place] [address]  Culture, blood (routine x 2)     Status: Abnormal   Collection Time: 07/23/19 10:08 PM   Specimen: BLOOD RIGHT HAND  Result Value Ref Range Status   Specimen Description   Final    BLOOD RIGHT HAND Performed at [facility] [address], [place], [place] [address]    Special Requests   Final    BOTTLES DRAWN AEROBIC ONLY Blood Culture results may not be optimal due to an inadequate volume of blood received in culture bottles Performed at [facility] [address], [place], [place] [address]    Culture  Setup Time   Final    GRAM NEGATIVE RODS BOTTLES DRAWN AEROBIC ONLY CRITICAL VALUE NOTED.  VALUE IS CONSISTENT WITH PREVIOUSLY REPORTED AND CALLED VALUE.    Culture (A)  Final    ENTEROBACTER CLOACAE SUSCEPTIBILITIES PERFORMED ON PREVIOUS CULTURE WITHIN THE LAST 5 DAYS. Performed at [facility], [place] [address] [place], [place] [address]    Report Status 07/27/2019 FINAL  Final    Radiology Reports DG CHEST PORT 1 VIEW  Result Date: 07/27/2019 CLINICAL DATA:  Follow-up coronavirus pneumonia. EXAM: PORTABLE CHEST 1 VIEW COMPARISON:  07/25/2019 FINDINGS: Right internal jugular central line tip unchanged in the SVC above the right atrium. Pacemaker as seen previously. Widespread bilateral pulmonary  infiltrates appear the same. No evidence of new consolidation, collapse or effusion. IMPRESSION: No change.  Widespread bilateral pulmonary infiltrates. Electronically Signed   By: [place] Chimes M.D.   On: 07/27/2019 07:32   DG Chest Port 1 View  Result Date: 07/25/2019 CLINICAL DATA:  Hypoxia EXAM: PORTABLE CHEST 1 VIEW COMPARISON:  Two days ago FINDINGS: Cardiomegaly. Biventricular pacer from the left. Diffuse pulmonary opacities are progressed. Right IJ line with tip at the upper cavoatrial junction. No evident effusion or pneumothorax IMPRESSION: Worsening bilateral pneumonia. Electronically Signed   By: [place] Fantasia M.D.   On: 07/25/2019 11:28   DG CHEST PORT 1 VIEW  Result Date: 07/23/2019 CLINICAL DATA:  Central line placement. EXAM: PORTABLE CHEST 1 VIEW COMPARISON:  Chest x-ray from same day at 0653 hours. FINDINGS: New right internal jugular central venous catheter with tip in the distal SVC. Unchanged  left chest wall pacemaker. Stable cardiomegaly. Patchy airspace disease throughout both lungs has mildly worsened. No pleural effusion or pneumothorax. No acute osseous abnormality. IMPRESSION: 1. New right internal jugular central venous catheter without complicating feature. 2. Worsened multifocal pneumonia. Electronically Signed   By: Titus Dubin M.D.   On: 07/23/2019 17:39   DG CHEST PORT 1 VIEW  Result Date: 07/23/2019 CLINICAL DATA:  Shortness of breath EXAM: PORTABLE CHEST 1 VIEW COMPARISON:  Radiograph 07/16/2019, CT 06/12/2017 FINDINGS: Pacer pack overlies the left chest wall with leads at the cardiac apex and coronary sinus. Telemetry leads overlie the chest. Nasal cannula projects over the upper chest. Surgical clips are seen at the base of the neck. Interval improvement in the diffuse bilateral opacities throughout both lungs with more focal consolidation in the left lung base. No visible pneumothorax. No effusion. No acute osseous or soft tissue abnormality. Degenerative  changes are present in the imaged spine and shoulders. Severe curvature of the thoracolumbar spine with thoracic levocurvature and lumbar dextrocurvature. IMPRESSION: Interval improvement in the diffuse bilateral opacities likely reflecting COVID-19 pneumonia. Electronically Signed   By: Lovena Le M.D.   On: 07/23/2019 06:53   DG Chest Port 1 View  Result Date: 07/16/2019 CLINICAL DATA:  Shortness of breath. EXAM: PORTABLE CHEST 1 VIEW COMPARISON:  07/09/2019 and CT chest 06/12/2017. FINDINGS: Trachea is midline. Heart is enlarged, stable. Pacemaker lead tips are stable in position. Worsening diffuse bilateral airspace opacification with left lower lobe consolidation. No definite pleural fluid. Thyroidectomy clips. Probable bone infarct or enchondroma in the proximal right humerus. IMPRESSION: Worsening bilateral airspace opacification, consistent with worsening COVID-19 pneumonia. Electronically Signed   By: Lorin Picket M.D.   On: 07/16/2019 09:04   DG CHEST PORT 1 VIEW  Result Date: 07/09/2019 CLINICAL DATA:  Shortness of breath EXAM: PORTABLE CHEST 1 VIEW COMPARISON:  07/06/2019 FINDINGS: Patchy bilateral airspace disease again noted, worsening since prior study, particularly in the left lower lobe. Cardiomegaly. Pacer remains in place, unchanged. No effusions or pneumothorax. No acute bony abnormality. IMPRESSION: Patchy bilateral airspace opacities, worsening since prior study, particularly in the left lower lobe. Electronically Signed   By: Rolm Baptise M.D.   On: 07/09/2019 21:47   DG Chest Port 1 View  Result Date: 07/03/2019 CLINICAL DATA:  COVID-19 positive.  Worsening shortness of breath. EXAM: PORTABLE CHEST 1 VIEW COMPARISON:  07/12/2019. FINDINGS: Surgical clips noted over the neck. AICD in stable position. Stable cardiomegaly. Diffuse severe bilateral pulmonary infiltrates again noted. Slight improvement in aeration on today's exam. No pleural effusion pneumothorax. 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 No acute bony abnormality. IMPRESSION: 1.  AICD in stable position.  Stable cardiomegaly. 2. Diffuse severe bilateral pulmonary infiltrates again noted. Slight improvement in aeration on today's exam. Electronically Signed   By: Marcello Moores  Register   On: 07/03/2019 07:15   DG Chest Port 1 View  Result Date: 07/07/2019 CLINICAL DATA:  Hypoxia. EXAM: PORTABLE CHEST 1 VIEW COMPARISON:  None. FINDINGS: There is a dual lead AICD. Moderate severity diffuse bilateral infiltrates are seen. There is a small right pleural effusion. No pneumothorax is identified. The cardiac silhouette is markedly enlarged. Multiple radiopaque surgical clips are seen overlying the superior mediastinum. There is marked severity levoscoliosis of the thoracic spine with moderate severity multilevel degenerative changes. IMPRESSION: 1. Moderate severity diffuse bilateral infiltrates with a small right pleural effusion. 2. Marked enlargement of the cardiac silhouette. 3. Dual lead AICD in place. Electronically Signed   By: Hoover Browns  [place] M.D.   On: 07/23/2019 15:50   DG Chest Port 1V today  Result Date: 07/06/2019 CLINICAL DATA:  Dyspnea, COVID-19 positive EXAM: PORTABLE CHEST 1 VIEW COMPARISON:  07/03/2019 chest radiograph. FINDINGS: Stable configuration of 2 lead left subclavian pacemaker. Surgical clips overlie the central lower neck. Stable cardiomediastinal silhouette with mild cardiomegaly. No pneumothorax. No pleural effusion. Patchy hazy opacities throughout the mid to lower lungs bilaterally, similar to minimally improved. IMPRESSION: Patchy hazy opacities throughout the mid to lower lungs bilaterally, similar to minimally improved, compatible with COVID-19 pneumonia. Stable cardiomegaly. Electronically Signed   By: Ilona Sorrel M.D.   On: 07/06/2019 13:09   ECHOCARDIOGRAM COMPLETE  Result Date: 07/23/2019   ECHOCARDIOGRAM REPORT   Patient Name:   MARVELL STAVOLA        Date of Exam: 07/23/2019 Medical Rec  #:  [id number]              Height:       58.0 in Accession #:    [id number]             Weight:       132.1 lb Date of Birth:  Nov 04, 1955             BSA:          1.53 m Patient Age:    78 years               BP:           96/52 mmHg Patient Gender: F                      HR:           72 bpm. Exam Location:  [facility]patient [facility] Procedure: 2D Echo Indications:    Hypotension  History:        Patient has prior history of Echocardiogram examinations, most                 recent 03/25/2019. CHF, Pacemaker, Covis-19 Positive,                 Arrythmias:Atrial Fibrillation; Risk Factors:Diabetes,                 Hypertension and Dyslipidemia.  Sonographer:    Mikki Santee RDCS (AE) Referring Phys: [id number] Jashayla Glatfelter S Abbe Bula IMPRESSIONS  1. Visually appears to have mild-moderate LVH, most prominent in septum. Overall normal LVEF on this study, improved from prior, with LV apical hypokinesis and prominent apical wall thickness.  2. Left ventricular ejection fraction, by visual estimation, is 55 to 60%. The left ventricle has normal function. There is moderately increased left ventricular hypertrophy.  3. Moderate hypokinesis of the left ventricular, apical apical segment.  4. Left ventricular diastolic parameters are indeterminate.  5. The left ventricle demonstrates regional wall motion abnormalities.  6. Global right ventricle has low normal systolic function.The right ventricular size is normal. No increase in right ventricular wall thickness.  7. Left atrial size was severely dilated.  8. Right atrial size was normal.  9. Small pericardial effusion. 10. Mild mitral annular calcification. 11. The mitral valve is normal in structure. Mild mitral valve regurgitation. 12. The tricuspid valve is normal in structure. 13. The tricuspid valve is normal in structure. Tricuspid valve regurgitation is trivial. 14. The aortic valve is tricuspid. Aortic valve regurgitation is trivial. 15. The pulmonic valve was grossly  normal. Pulmonic valve regurgitation is trivial. 16. A pacer wire is visualized  in the RA and RV. FINDINGS  Left Ventricle: Left ventricular ejection fraction, by visual estimation, is 55 to 60%. The left ventricle has normal function. Moderate hypokinesis of the left ventricular, apical apical segment. The left ventricle demonstrates regional wall motion abnormalities. There is moderately increased left ventricular hypertrophy. Asymmetric left ventricular hypertrophy of the basal-septal wall. Left ventricular diastolic parameters are indeterminate. Right Ventricle: The right ventricular size is normal. No increase in right ventricular wall thickness. Global RV systolic function is has low normal systolic function. Left Atrium: Left atrial size was severely dilated. Right Atrium: Right atrial size was normal in size Pericardium: A small pericardial effusion is present. Mitral Valve: The mitral valve is normal in structure. Mild mitral annular calcification. Mild mitral valve regurgitation. Tricuspid Valve: The tricuspid valve is normal in structure. Tricuspid valve regurgitation is trivial. Aortic Valve: The aortic valve is tricuspid. Aortic valve regurgitation is trivial. Pulmonic Valve: The pulmonic valve was grossly normal. Pulmonic valve regurgitation is trivial. Pulmonic regurgitation is trivial. Aorta: The aortic root, ascending aorta and aortic arch are all structurally normal, with no evidence of dilitation or obstruction. Pulmonary Artery: The pulmonary artery is not well seen. IAS/Shunts: No atrial level shunt detected by color flow Doppler. Additional Comments: A pacer wire is visualized in the right atrium and right ventricle.  LEFT VENTRICLE PLAX 2D LVIDd:         4.21 cm  Diastology LVIDs:         2.91 cm  LV e' lateral: 5.22 cm/s LV PW:         1.10 cm  LV e' medial:  5.77 cm/s LV IVS:        1.72 cm LVOT diam:     1.90 cm LV SV:         47 ml LV SV Index:   29.34 LVOT Area:     2.84 cm  RIGHT  VENTRICLE TAPSE (M-mode): 1.7 cm LEFT ATRIUM              Index       RIGHT ATRIUM           Index LA diam:        3.90 cm  2.55 cm/m  RA Area:     15.40 cm LA Vol (A2C):   99.6 ml  65.24 ml/m RA Volume:   35.70 ml  23.39 ml/m LA Vol (A4C):   96.8 ml  63.41 ml/m LA Biplane Vol: 106.0 ml 69.44 ml/m  AORTIC VALVE LVOT Vmax:   190.00 cm/s LVOT Vmean:  118.000 cm/s LVOT VTI:    0.356 m  AORTA Ao Root diam: 3.00 cm TRICUSPID VALVE TR Peak grad:   20.2 mmHg TR Vmax:        225.00 cm/s  SHUNTS Systemic VTI:  0.36 m Systemic Diam: 1.90 cm  Buford Dresser MD Electronically signed by Buford Dresser MD Signature Date/Time: 07/23/2019/1:37:02 PM    Final    [place] EKG SITE RITE  Result Date: 07/23/2019 If Site Rite image not attached, placement could not be confirmed due to current cardiac rhythm.

## 2019-08-25 DEATH — deceased

## 2022-11-28 ENCOUNTER — Other Ambulatory Visit: Payer: Self-pay
